# Patient Record
Sex: Male | Born: 1960 | Race: Black or African American | Hispanic: No | Smoking: Never smoker
Health system: Southern US, Community
[De-identification: ages and names within clinical notes are randomized; demographics above are authoritative.]

## PROBLEM LIST (undated history)

## (undated) DIAGNOSIS — I639 Cerebral infarction, unspecified: Secondary | ICD-10-CM

## (undated) DIAGNOSIS — I10 Essential (primary) hypertension: Secondary | ICD-10-CM

---

## 2001-11-12 ENCOUNTER — Emergency Department (HOSPITAL_COMMUNITY): Admission: EM | Admit: 2001-11-12 | Discharge: 2001-11-12 | Payer: Self-pay | Admitting: Emergency Medicine

## 2003-08-29 ENCOUNTER — Emergency Department (HOSPITAL_COMMUNITY): Admission: EM | Admit: 2003-08-29 | Discharge: 2003-08-29 | Payer: Self-pay

## 2003-09-10 ENCOUNTER — Emergency Department (HOSPITAL_COMMUNITY): Admission: EM | Admit: 2003-09-10 | Discharge: 2003-09-10 | Payer: Self-pay | Admitting: Emergency Medicine

## 2003-10-11 ENCOUNTER — Emergency Department (HOSPITAL_COMMUNITY): Admission: EM | Admit: 2003-10-11 | Discharge: 2003-10-12 | Payer: Self-pay | Admitting: Emergency Medicine

## 2003-10-12 ENCOUNTER — Emergency Department (HOSPITAL_COMMUNITY): Admission: EM | Admit: 2003-10-12 | Discharge: 2003-10-12 | Payer: Self-pay | Admitting: Emergency Medicine

## 2003-11-23 ENCOUNTER — Emergency Department (HOSPITAL_COMMUNITY): Admission: EM | Admit: 2003-11-23 | Discharge: 2003-11-23 | Payer: Self-pay | Admitting: Emergency Medicine

## 2004-09-20 ENCOUNTER — Emergency Department (HOSPITAL_COMMUNITY): Admission: EM | Admit: 2004-09-20 | Discharge: 2004-09-20 | Payer: Self-pay | Admitting: Family Medicine

## 2004-11-02 ENCOUNTER — Ambulatory Visit (HOSPITAL_BASED_OUTPATIENT_CLINIC_OR_DEPARTMENT_OTHER): Admission: RE | Admit: 2004-11-02 | Discharge: 2004-11-02 | Payer: Self-pay | Admitting: Otolaryngology

## 2004-11-04 ENCOUNTER — Ambulatory Visit: Payer: Self-pay | Admitting: Internal Medicine

## 2004-11-23 ENCOUNTER — Emergency Department (HOSPITAL_COMMUNITY): Admission: EM | Admit: 2004-11-23 | Discharge: 2004-11-23 | Payer: Self-pay | Admitting: Emergency Medicine

## 2005-03-28 ENCOUNTER — Emergency Department (HOSPITAL_COMMUNITY): Admission: EM | Admit: 2005-03-28 | Discharge: 2005-03-28 | Payer: Self-pay | Admitting: *Deleted

## 2005-04-28 ENCOUNTER — Emergency Department (HOSPITAL_COMMUNITY): Admission: EM | Admit: 2005-04-28 | Discharge: 2005-04-28 | Payer: Self-pay | Admitting: Family Medicine

## 2005-07-07 ENCOUNTER — Emergency Department (HOSPITAL_COMMUNITY): Admission: EM | Admit: 2005-07-07 | Discharge: 2005-07-07 | Payer: Self-pay | Admitting: Emergency Medicine

## 2005-07-10 ENCOUNTER — Emergency Department (HOSPITAL_COMMUNITY): Admission: EM | Admit: 2005-07-10 | Discharge: 2005-07-11 | Payer: Self-pay | Admitting: Emergency Medicine

## 2005-08-15 ENCOUNTER — Emergency Department (HOSPITAL_COMMUNITY): Admission: EM | Admit: 2005-08-15 | Discharge: 2005-08-15 | Payer: Self-pay | Admitting: Family Medicine

## 2006-10-20 ENCOUNTER — Emergency Department (HOSPITAL_COMMUNITY): Admission: EM | Admit: 2006-10-20 | Discharge: 2006-10-20 | Payer: Self-pay | Admitting: Emergency Medicine

## 2006-10-28 ENCOUNTER — Encounter: Admission: RE | Admit: 2006-10-28 | Discharge: 2006-10-28 | Payer: Self-pay | Admitting: Chiropractic Medicine

## 2008-03-23 ENCOUNTER — Emergency Department (HOSPITAL_COMMUNITY): Admission: EM | Admit: 2008-03-23 | Discharge: 2008-03-23 | Payer: Self-pay | Admitting: Emergency Medicine

## 2009-02-22 ENCOUNTER — Emergency Department (HOSPITAL_COMMUNITY): Admission: EM | Admit: 2009-02-22 | Discharge: 2009-02-22 | Payer: Self-pay | Admitting: Emergency Medicine

## 2009-03-23 ENCOUNTER — Emergency Department (HOSPITAL_COMMUNITY): Admission: EM | Admit: 2009-03-23 | Discharge: 2009-03-23 | Payer: Self-pay | Admitting: Emergency Medicine

## 2009-10-30 ENCOUNTER — Emergency Department (HOSPITAL_COMMUNITY): Admission: EM | Admit: 2009-10-30 | Discharge: 2009-10-30 | Payer: Self-pay | Admitting: Emergency Medicine

## 2010-01-21 ENCOUNTER — Observation Stay (HOSPITAL_COMMUNITY): Admission: EM | Admit: 2010-01-21 | Discharge: 2010-01-22 | Payer: Self-pay | Admitting: Emergency Medicine

## 2010-05-13 ENCOUNTER — Emergency Department (HOSPITAL_COMMUNITY): Admission: EM | Admit: 2010-05-13 | Discharge: 2010-05-14 | Payer: Self-pay | Admitting: Emergency Medicine

## 2010-06-19 ENCOUNTER — Emergency Department (HOSPITAL_COMMUNITY)
Admission: EM | Admit: 2010-06-19 | Discharge: 2010-06-19 | Payer: Self-pay | Source: Home / Self Care | Admitting: Emergency Medicine

## 2010-08-24 ENCOUNTER — Emergency Department (HOSPITAL_COMMUNITY)
Admission: EM | Admit: 2010-08-24 | Discharge: 2010-08-24 | Disposition: A | Discharge status: Home / Self Care | Payer: Worker's Compensation | Attending: Emergency Medicine | Admitting: Emergency Medicine

## 2010-08-24 DIAGNOSIS — G8929 Other chronic pain: Secondary | ICD-10-CM | POA: Insufficient documentation

## 2010-08-24 DIAGNOSIS — M542 Cervicalgia: Secondary | ICD-10-CM | POA: Insufficient documentation

## 2010-08-24 DIAGNOSIS — M549 Dorsalgia, unspecified: Secondary | ICD-10-CM | POA: Insufficient documentation

## 2010-08-24 DIAGNOSIS — IMO0002 Reserved for concepts with insufficient information to code with codable children: Secondary | ICD-10-CM | POA: Insufficient documentation

## 2010-08-24 DIAGNOSIS — Z79899 Other long term (current) drug therapy: Secondary | ICD-10-CM | POA: Insufficient documentation

## 2010-10-02 LAB — POCT I-STAT, CHEM 8
BUN: 13 mg/dL (ref 6–23)
Calcium, Ion: 1.17 mmol/L (ref 1.12–1.32)
Chloride: 102 mEq/L (ref 96–112)
Creatinine, Ser: 0.8 mg/dL (ref 0.4–1.5)
Glucose, Bld: 359 mg/dL — ABNORMAL HIGH (ref 70–99)
HCT: 44 % (ref 39.0–52.0)
Hemoglobin: 15 g/dL (ref 13.0–17.0)
Potassium: 4 mEq/L (ref 3.5–5.1)
Sodium: 137 mEq/L (ref 135–145)
TCO2: 27 mmol/L (ref 0–100)

## 2010-10-02 LAB — GLUCOSE, CAPILLARY
Glucose-Capillary: 250 mg/dL — ABNORMAL HIGH (ref 70–99)
Glucose-Capillary: 341 mg/dL — ABNORMAL HIGH (ref 70–99)

## 2010-10-02 LAB — URINALYSIS, ROUTINE W REFLEX MICROSCOPIC
Hgb urine dipstick: NEGATIVE
Protein, ur: NEGATIVE mg/dL
Urobilinogen, UA: 0.2 mg/dL (ref 0.0–1.0)

## 2010-10-02 LAB — URINE MICROSCOPIC-ADD ON

## 2010-10-07 LAB — GLUCOSE, CAPILLARY
Glucose-Capillary: 509 mg/dL — ABNORMAL HIGH (ref 70–99)
Glucose-Capillary: 534 mg/dL — ABNORMAL HIGH (ref 70–99)

## 2010-10-07 LAB — POCT I-STAT, CHEM 8
HCT: 46 % (ref 39.0–52.0)
Hemoglobin: 15.6 g/dL (ref 13.0–17.0)
Potassium: 4.7 mEq/L (ref 3.5–5.1)
Sodium: 136 mEq/L (ref 135–145)

## 2010-10-07 LAB — URINALYSIS, ROUTINE W REFLEX MICROSCOPIC
Nitrite: NEGATIVE
Specific Gravity, Urine: 1.037 — ABNORMAL HIGH (ref 1.005–1.030)
Urobilinogen, UA: 0.2 mg/dL (ref 0.0–1.0)

## 2010-10-07 LAB — POCT I-STAT 3, VENOUS BLOOD GAS (G3P V)
Acid-Base Excess: 4 mmol/L — ABNORMAL HIGH (ref 0.0–2.0)
O2 Saturation: 88 %

## 2010-10-07 LAB — URINE MICROSCOPIC-ADD ON

## 2010-10-10 LAB — URINALYSIS, ROUTINE W REFLEX MICROSCOPIC
Bilirubin Urine: NEGATIVE
Hgb urine dipstick: NEGATIVE
Nitrite: NEGATIVE
Specific Gravity, Urine: 1.036 — ABNORMAL HIGH (ref 1.005–1.030)
pH: 5 (ref 5.0–8.0)

## 2010-10-10 LAB — DIFFERENTIAL
Eosinophils Relative: 2 % (ref 0–5)
Lymphocytes Relative: 40 % (ref 12–46)
Lymphs Abs: 2 10*3/uL (ref 0.7–4.0)
Monocytes Absolute: 0.4 10*3/uL (ref 0.1–1.0)
Monocytes Relative: 8 % (ref 3–12)
Neutro Abs: 2.4 10*3/uL (ref 1.7–7.7)

## 2010-10-10 LAB — COMPREHENSIVE METABOLIC PANEL
AST: 16 U/L (ref 0–37)
Albumin: 3.9 g/dL (ref 3.5–5.2)
Calcium: 8.9 mg/dL (ref 8.4–10.5)
Chloride: 100 mEq/L (ref 96–112)
Creatinine, Ser: 0.83 mg/dL (ref 0.4–1.5)
GFR calc Af Amer: >60 mL/min (ref >60)
Total Bilirubin: 0.8 mg/dL (ref 0.3–1.2)
Total Protein: 7.7 g/dL (ref 6.0–8.3)

## 2010-10-10 LAB — URINE MICROSCOPIC-ADD ON

## 2010-10-10 LAB — CBC
MCV: 87.6 fL (ref 78.0–100.0)
Platelets: 272 10*3/uL (ref 150–400)
RDW: 13.1 % (ref 11.5–15.5)
WBC: 5 10*3/uL (ref 4.0–10.5)

## 2010-10-10 LAB — GLUCOSE, CAPILLARY: Glucose-Capillary: 307 mg/dL — ABNORMAL HIGH (ref 70–99)

## 2010-10-27 LAB — POCT URINALYSIS DIP (DEVICE)
Bilirubin Urine: NEGATIVE
Ketones, ur: NEGATIVE mg/dL
Protein, ur: NEGATIVE mg/dL
Specific Gravity, Urine: 1.01 (ref 1.005–1.030)
pH: 7 (ref 5.0–8.0)

## 2010-10-27 LAB — POCT I-STAT, CHEM 8
BUN: 12 mg/dL (ref 6–23)
Calcium, Ion: 1.24 mmol/L (ref 1.12–1.32)
Chloride: 100 mEq/L (ref 96–112)
Creatinine, Ser: 0.9 mg/dL (ref 0.4–1.5)
TCO2: 28 mmol/L (ref 0–100)

## 2010-12-07 NOTE — Procedures (Signed)
NAME:  REID, REGAS NO.:  [id number]   MEDICAL RECORD NO.:  [id number]          PATIENT TYPE:  OUT   LOCATION:  SLEEP CENTER                 FACILITY:  [facility]   PHYSICIAN:  Clinton D. Maple Hudson, M.D. DATE OF BIRTH:  08/04/1960   DATE OF STUDY:                              NOCTURNAL POLYSOMNOGRAM   REFERRING PHYSICIAN:  Zola Button T. Lazarus Salines, M.D.   INDICATIONS FOR STUDY:  Hypersomnia with sleep apnea. Epworth sleepiness  score 14/24, BMI 34, weight 214 pounds.   SLEEP ARCHITECTURE:  Total sleep time 440 minutes with sleep efficiency of  97%. Stage I was 1%, stage II was 55%, stages III and IV were 22%, REM was  21% of total sleep time. Sleep latency was 2 minutes. REM latency 41  minutes. Awake after sleep onset 10 minutes. Arousal index 21.   RESPIRATORY DATA:  Split study protocol. Respiratory disturbance index (RDI,  AHI) 138.6 obstructive events per hour before CPAP indicating severe  obstructive sleep apnea/hypopnea. This included 278 obstructive apneas, 1  mixed apnea, and 8 hypopneas before CPAP. Most events and most initial sleep  were while supine.  REM RDI 51.  CPAP was titrated to 25 CWP, RDI of 10.9  per hour, although snoring was not completely eliminated even at this  pressure. A medium full face Ultra Mirage mask was used with heated  humidifier.   OXYGEN DATA:  Very loud snoring with oxygen desaturation to a nadir of 51%  before CPAP. Even with CPAP control saturation was still dipping as low as  85% on room air.   CARDIAC DATA:  Normal sinus rhythm with sinus tachycardia, range 82 to 102  beats per minute.   MOVEMENT/PARASOMNIA:  Occasional leg jerk.   IMPRESSION/RECOMMENDATIONS:  1.  Very severe obstructive sleep apnea/hypopnea syndrome, RDI before CPAP      138 per hour with oxygen desaturation to 51% and very loud snoring.  2.  CPAP titrated to 25 CWP, RDI 10.9 per hour with residual snoring, using      a medium full face Ultra Mirage mask  with heated humidifier.  3.  Residual mild oxygen desaturation even at final CPAP control.    CDY/MEDQ  D:  11/04/2004 17:26:17  T:  11/04/2004 22:14:39  Job:  [id number]

## 2011-04-24 LAB — GLUCOSE, CAPILLARY: Glucose-Capillary: 107 — ABNORMAL HIGH

## 2011-08-05 ENCOUNTER — Encounter (HOSPITAL_COMMUNITY): Payer: Self-pay | Admitting: *Deleted

## 2011-08-05 ENCOUNTER — Emergency Department (HOSPITAL_COMMUNITY)
Admission: EM | Admit: 2011-08-05 | Discharge: 2011-08-05 | Disposition: A | Discharge status: Home / Self Care | Payer: BC Managed Care – PPO | Attending: Emergency Medicine | Admitting: Emergency Medicine

## 2011-08-05 ENCOUNTER — Emergency Department (HOSPITAL_COMMUNITY): Payer: BC Managed Care – PPO

## 2011-08-05 DIAGNOSIS — R6889 Other general symptoms and signs: Secondary | ICD-10-CM

## 2011-08-05 DIAGNOSIS — J111 Influenza due to unidentified influenza virus with other respiratory manifestations: Secondary | ICD-10-CM | POA: Insufficient documentation

## 2011-08-05 DIAGNOSIS — E119 Type 2 diabetes mellitus without complications: Secondary | ICD-10-CM | POA: Insufficient documentation

## 2011-08-05 DIAGNOSIS — Z79899 Other long term (current) drug therapy: Secondary | ICD-10-CM | POA: Insufficient documentation

## 2011-08-05 MED ORDER — TRAMADOL-ACETAMINOPHEN 37.5-325 MG PO TABS: 1.0000 | ORAL_TABLET | Freq: Four times a day (QID) | ORAL | Status: AC | PRN

## 2011-08-05 MED ORDER — ONDANSETRON 4 MG PO TBDP: 4.0000 mg | ORAL_TABLET | Freq: Three times a day (TID) | ORAL | Status: AC | PRN

## 2011-08-05 MED ORDER — BENZONATATE 100 MG PO CAPS: 100.0000 mg | ORAL_CAPSULE | Freq: Three times a day (TID) | ORAL | Status: AC

## 2011-08-05 NOTE — ED Provider Notes (Signed)
History     CSN: [id number]  Arrival date & time 08/05/11  1206   First MD Initiated Contact with Patient 08/05/11 1237      Chief Complaint  Patient presents with  . Influenza    flu symptoms    (Consider location/radiation/quality/duration/timing/severity/associated sxs/prior treatment) HPI  Pt presents to the ED with complaints of flu-like symptoms of cough, congestion, sore throat, muscle aches, chills, fevers, ear pain The patient states that the symptoms started 10 days ago.  Pt has been around other sick contacts and did not get the flu shot this year. The patient denies headaches, neck pain, weakness, vision changes, severe abdominal pain, inability to eat or drink, difficulty breathing, SOB, wheezing, chest pain. The patient has tried cough medicine, NSAIDS, and rest but has only felt mild relief.    Past Medical History  Diagnosis Date  . Diabetes mellitus     History reviewed. No pertinent past surgical history.  No family history on file.  History  Substance Use Topics  . Smoking status: Former Games developer  . Smokeless tobacco: Not on file  . Alcohol Use: No      Review of Systems  All other systems reviewed and are negative.    Allergies  Review of patient's allergies indicates no known allergies.  Home Medications   Current Outpatient Rx  Name Route Sig Dispense Refill  . CINNAMON 500 MG PO CAPS Oral Take 500 mg by mouth daily.    . OMEGA-3 FATTY ACIDS 1000 MG PO CAPS Oral Take 2 g by mouth daily.    Marland Kitchen OVER THE COUNTER MEDICATION Oral Take 30 mLs by mouth 2 (two) times daily. Over the counter cold and flu liquid medication. For cold/flu symptoms.    Marland Kitchen BENZONATATE 100 MG PO CAPS Oral Take 1 capsule (100 mg total) by mouth every 8 (eight) hours. 21 capsule 0  . ONDANSETRON 4 MG PO TBDP Oral Take 1 tablet (4 mg total) by mouth every 8 (eight) hours as needed for nausea. 20 tablet 0  . TRAMADOL-ACETAMINOPHEN 37.5-325 MG PO TABS Oral Take 1 tablet by mouth  every 6 (six) hours as needed for pain. 30 tablet 0    BP 139/90  Pulse 112  Temp(Src) 98.7 F (37.1 C) (Oral)  Resp 26  Wt 195 lb (88.451 kg)  SpO2 96%  Physical Exam  Nursing note and vitals reviewed. Constitutional: He is oriented to person, place, and time. He appears well-developed and well-nourished.  HENT:  Head: Normocephalic and atraumatic.  Eyes: EOM are normal. Pupils are equal, round, and reactive to light.  Neck: Normal range of motion.  Cardiovascular: Normal rate and regular rhythm.   Pulmonary/Chest: Effort normal and breath sounds normal.  Abdominal: Soft.  Musculoskeletal: Normal range of motion.  Neurological: He is alert and oriented to person, place, and time.  Skin: Skin is warm and dry.    ED Course  Procedures (including critical care time)  Labs Reviewed - No data to display Dg Chest 2 View  08/05/2011  *RADIOLOGY REPORT*  Clinical Data: Cough, body aches, history diabetes  CHEST - 2 VIEW  Comparison: 02/22/2009  Findings: Upper-normal size of cardiac silhouette. Mediastinal contours and pulmonary vascularity normal. Lungs clear. No pleural effusion or pneumothorax. Bones unremarkable.  IMPRESSION: No acute abnormalities.  Original Report Authenticated By: Lollie Marrow, M.D.     1. Flu-like symptoms       MDM  Pt treated symptomatically and given work note. Pulse is 86 upon  my examination in room.        Dorthula Matas, PA 08/05/11 1425

## 2011-08-05 NOTE — ED Notes (Signed)
Pt states "been feeling bad since Thursday, started aching, been coughing"

## 2011-08-06 NOTE — ED Provider Notes (Signed)
Medical screening examination/treatment/procedure(s) were performed by non-physician practitioner and as supervising physician I was immediately available for consultation/collaboration.   Dione Booze, MD 08/06/11 [phone]

## 2012-07-23 ENCOUNTER — Encounter (HOSPITAL_COMMUNITY): Payer: Self-pay | Admitting: Emergency Medicine

## 2012-07-23 ENCOUNTER — Emergency Department (HOSPITAL_COMMUNITY)
Admission: EM | Admit: 2012-07-23 | Discharge: 2012-07-23 | Disposition: A | Discharge status: Home / Self Care | Payer: BC Managed Care – PPO | Attending: Emergency Medicine | Admitting: Emergency Medicine

## 2012-07-23 DIAGNOSIS — E119 Type 2 diabetes mellitus without complications: Secondary | ICD-10-CM | POA: Insufficient documentation

## 2012-07-23 DIAGNOSIS — S0990XA Unspecified injury of head, initial encounter: Secondary | ICD-10-CM | POA: Insufficient documentation

## 2012-07-23 DIAGNOSIS — S134XXA Sprain of ligaments of cervical spine, initial encounter: Secondary | ICD-10-CM

## 2012-07-23 DIAGNOSIS — Y92410 Unspecified street and highway as the place of occurrence of the external cause: Secondary | ICD-10-CM | POA: Insufficient documentation

## 2012-07-23 DIAGNOSIS — S139XXA Sprain of joints and ligaments of unspecified parts of neck, initial encounter: Secondary | ICD-10-CM | POA: Insufficient documentation

## 2012-07-23 DIAGNOSIS — Y9389 Activity, other specified: Secondary | ICD-10-CM | POA: Insufficient documentation

## 2012-07-23 DIAGNOSIS — IMO0002 Reserved for concepts with insufficient information to code with codable children: Secondary | ICD-10-CM | POA: Insufficient documentation

## 2012-07-23 DIAGNOSIS — Z87891 Personal history of nicotine dependence: Secondary | ICD-10-CM | POA: Insufficient documentation

## 2012-07-23 MED ORDER — IBUPROFEN 800 MG PO TABS
800.0000 mg | ORAL_TABLET | Freq: Once | ORAL | Status: AC
  Administered 2012-07-23: 800 mg via ORAL
  Filled 2012-07-23: qty 1

## 2012-07-23 MED ORDER — IBUPROFEN 800 MG PO TABS: 800.0000 mg | ORAL_TABLET | Freq: Three times a day (TID) | ORAL | Status: DC

## 2012-07-23 MED ORDER — OXYCODONE-ACETAMINOPHEN 5-325 MG PO TABS: 1.0000 | ORAL_TABLET | Freq: Four times a day (QID) | ORAL | Status: DC | PRN

## 2012-07-23 MED ORDER — OXYCODONE-ACETAMINOPHEN 5-325 MG PO TABS
1.0000 | ORAL_TABLET | Freq: Once | ORAL | Status: AC
  Administered 2012-07-23: 1 via ORAL
  Filled 2012-07-23: qty 1

## 2012-07-23 MED ORDER — CYCLOBENZAPRINE HCL 10 MG PO TABS
5.0000 mg | ORAL_TABLET | Freq: Once | ORAL | Status: AC
  Administered 2012-07-23: 5 mg via ORAL
  Filled 2012-07-23: qty 1

## 2012-07-23 MED ORDER — CYCLOBENZAPRINE HCL 10 MG PO TABS: 10.0000 mg | ORAL_TABLET | Freq: Two times a day (BID) | ORAL | Status: DC | PRN
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
## 2012-07-25 NOTE — ED Provider Notes (Signed)
Medical screening examination/treatment/procedure(s) were performed by non-physician practitioner and as supervising physician I was immediately available for consultation/collaboration.   Reily Ilic, MD 07/25/12 0707 

## 2013-05-06 ENCOUNTER — Encounter (HOSPITAL_COMMUNITY): Payer: Self-pay | Admitting: Emergency Medicine

## 2013-05-06 ENCOUNTER — Emergency Department (HOSPITAL_COMMUNITY): Payer: Worker's Compensation

## 2013-05-06 ENCOUNTER — Emergency Department (HOSPITAL_COMMUNITY)
Admission: EM | Admit: 2013-05-06 | Discharge: 2013-05-06 | Disposition: A | Discharge status: Home / Self Care | Payer: Worker's Compensation | Attending: Emergency Medicine | Admitting: Emergency Medicine

## 2013-05-06 DIAGNOSIS — S4980XA Other specified injuries of shoulder and upper arm, unspecified arm, initial encounter: Secondary | ICD-10-CM | POA: Insufficient documentation

## 2013-05-06 DIAGNOSIS — S46909A Unspecified injury of unspecified muscle, fascia and tendon at shoulder and upper arm level, unspecified arm, initial encounter: Secondary | ICD-10-CM | POA: Insufficient documentation

## 2013-05-06 DIAGNOSIS — W11XXXA Fall on and from ladder, initial encounter: Secondary | ICD-10-CM | POA: Insufficient documentation

## 2013-05-06 DIAGNOSIS — T148XXA Other injury of unspecified body region, initial encounter: Secondary | ICD-10-CM

## 2013-05-06 DIAGNOSIS — Y9289 Other specified places as the place of occurrence of the external cause: Secondary | ICD-10-CM | POA: Insufficient documentation

## 2013-05-06 DIAGNOSIS — T1500XA Foreign body in cornea, unspecified eye, initial encounter: Secondary | ICD-10-CM | POA: Insufficient documentation

## 2013-05-06 DIAGNOSIS — Y990 Civilian activity done for income or pay: Secondary | ICD-10-CM | POA: Insufficient documentation

## 2013-05-06 DIAGNOSIS — Z79899 Other long term (current) drug therapy: Secondary | ICD-10-CM | POA: Insufficient documentation

## 2013-05-06 DIAGNOSIS — T1590XA Foreign body on external eye, part unspecified, unspecified eye, initial encounter: Secondary | ICD-10-CM | POA: Insufficient documentation

## 2013-05-06 DIAGNOSIS — Z87891 Personal history of nicotine dependence: Secondary | ICD-10-CM | POA: Insufficient documentation

## 2013-05-06 DIAGNOSIS — E119 Type 2 diabetes mellitus without complications: Secondary | ICD-10-CM | POA: Insufficient documentation

## 2013-05-06 DIAGNOSIS — W19XXXA Unspecified fall, initial encounter: Secondary | ICD-10-CM

## 2013-05-06 DIAGNOSIS — IMO0002 Reserved for concepts with insufficient information to code with codable children: Secondary | ICD-10-CM | POA: Insufficient documentation

## 2013-05-06 DIAGNOSIS — S0993XA Unspecified injury of face, initial encounter: Secondary | ICD-10-CM | POA: Insufficient documentation

## 2013-05-06 DIAGNOSIS — W010XXA Fall on same level from slipping, tripping and stumbling without subsequent striking against object, initial encounter: Secondary | ICD-10-CM | POA: Insufficient documentation

## 2013-05-06 LAB — CBC WITH DIFFERENTIAL/PLATELET
Basophils Relative: 1 % (ref 0–1)
Eosinophils Absolute: 0.1 10*3/uL (ref 0.0–0.7)
Hemoglobin: 13.9 g/dL (ref 13.0–17.0)
MCH: 29.4 pg (ref 26.0–34.0)
MCHC: 35.5 g/dL (ref 30.0–36.0)
Monocytes Relative: 8 % (ref 3–12)
Neutrophils Relative %: 53 % (ref 43–77)
Platelets: 299 10*3/uL (ref 150–400)

## 2013-05-06 LAB — URINALYSIS, ROUTINE W REFLEX MICROSCOPIC
Glucose, UA: 250 mg/dL — AB
Leukocytes, UA: NEGATIVE
Specific Gravity, Urine: 1.015 (ref 1.005–1.030)
pH: 5 (ref 5.0–8.0)

## 2013-05-06 LAB — COMPREHENSIVE METABOLIC PANEL
Albumin: 4 g/dL (ref 3.5–5.2)
Alkaline Phosphatase: 81 U/L (ref 39–117)
BUN: 10 mg/dL (ref 6–23)
Calcium: 9.1 mg/dL (ref 8.4–10.5)
Potassium: 3.7 mEq/L (ref 3.5–5.1)
Total Protein: 7.9 g/dL (ref 6.0–8.3)

## 2013-05-06 LAB — POCT I-STAT TROPONIN I: Troponin i, poc: 0 ng/mL (ref 0.00–0.08)

## 2013-05-06 MED ORDER — IOHEXOL 300 MG/ML  SOLN
100.0000 mL | Freq: Once | INTRAMUSCULAR | Status: AC | PRN
  Administered 2013-05-06: 100 mL via INTRAVENOUS

## 2013-05-06 MED ORDER — OXYCODONE-ACETAMINOPHEN 5-325 MG PO TABS: 2.0000 | ORAL_TABLET | Freq: Four times a day (QID) | ORAL | Status: DC | PRN

## 2013-05-06 MED ORDER — DIAZEPAM 5 MG PO TABS: 5.0000 mg | ORAL_TABLET | Freq: Four times a day (QID) | ORAL | Status: DC | PRN

## 2013-05-06 MED ORDER — ONDANSETRON HCL 4 MG/2ML IJ SOLN
4.0000 mg | Freq: Once | INTRAMUSCULAR | Status: AC
  Administered 2013-05-06: 4 mg via INTRAVENOUS
  Filled 2013-05-06: qty 2

## 2013-05-06 MED ORDER — MORPHINE SULFATE 4 MG/ML IJ SOLN
6.0000 mg | Freq: Once | INTRAMUSCULAR | Status: AC
  Administered 2013-05-06: 6 mg via INTRAVENOUS
  Filled 2013-05-06: qty 2

## 2013-05-06 MED ORDER — DIAZEPAM 5 MG/ML IJ SOLN
5.0000 mg | Freq: Once | INTRAMUSCULAR | Status: AC
  Administered 2013-05-06: 5 mg via INTRAVENOUS
  Filled 2013-05-06: qty 2

## 2013-05-06 MED ORDER — FLUORESCEIN SODIUM 1 MG OP STRP
1.0000 | ORAL_STRIP | Freq: Once | OPHTHALMIC | Status: AC
  Administered 2013-05-06: 1 via OPHTHALMIC
  Filled 2013-05-06: qty 1

## 2013-05-06 MED ORDER — HYDROMORPHONE HCL PF 1 MG/ML IJ SOLN
1.0000 mg | Freq: Once | INTRAMUSCULAR | Status: AC
  Administered 2013-05-06: 1 mg via INTRAVENOUS
  Filled 2013-05-06: qty 1

## 2013-05-06 MED ORDER — MORPHINE SULFATE 2 MG/ML IJ SOLN
INTRAMUSCULAR | Status: AC
  Filled 2013-05-06: qty 1

## 2013-05-06 MED ORDER — TETRACAINE HCL 0.5 % OP SOLN
1.0000 [drp] | Freq: Once | OPHTHALMIC | Status: AC
  Administered 2013-05-06: 1 [drp] via OPHTHALMIC
  Filled 2013-05-06: qty 2

## 2013-05-06 MED ORDER — SODIUM CHLORIDE 0.9 % IV BOLUS (SEPSIS)
1000.0000 mL | Freq: Once | INTRAVENOUS | Status: AC
  Administered 2013-05-06: 1000 mL via INTRAVENOUS

## 2013-05-06 MED ORDER — IBUPROFEN 800 MG PO TABS: 800.0000 mg | ORAL_TABLET | Freq: Three times a day (TID) | ORAL | Status: DC

## 2013-05-06 NOTE — ED Notes (Signed)
Pt placed on monitor, continuous pulse oximetry and blood pressure cuff; EKG being performed per Silverio Lay, MD

## 2013-05-06 NOTE — ED Notes (Signed)
Pt placed on 3L [place], 100%.
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
## 2013-05-06 NOTE — ED Notes (Signed)
MD at bedside completing fast exam.

## 2013-05-06 NOTE — ED Notes (Signed)
Pt's O2 dropped, placed on NRB. PT AO x4. 100% NRB.

## 2013-05-06 NOTE — ED Provider Notes (Signed)
CSN: [id number]     Arrival date & time 05/06/13  1424 History   First MD Initiated Contact with Patient 05/06/13 1450     Chief Complaint  Patient presents with  . Fall   (Consider location/radiation/quality/duration/timing/severity/associated sxs/prior Treatment) The history is provided by the patient.  Adam Callahan is a 52 y.o. male history diabetes here presenting with fall. Was on top of a 10 foot ladder and the tile fell and he slipped and fell backwards. He had loss of consciousness and complains of back pain and neck pain and right shoulder pain. Denies any abdominal pain or vomiting.    Past Medical History  Diagnosis Date  . Diabetes mellitus    History reviewed. No pertinent past surgical history. No family history on file. History  Substance Use Topics  . Smoking status: Former Games developer  . Smokeless tobacco: Not on file  . Alcohol Use: No    Review of Systems  Musculoskeletal: Positive for back pain and neck pain.       R shoulder pain   All other systems reviewed and are negative.    Allergies  Review of patient's allergies indicates no known allergies.  Home Medications   Current Outpatient Rx  Name  Route  Sig  Dispense  Refill  . Cinnamon 500 MG capsule   Oral   Take 500 mg by mouth daily.         . fish oil-omega-3 fatty acids 1000 MG capsule   Oral   Take 2 g by mouth daily.         Marland Kitchen ibuprofen (ADVIL,MOTRIN) 200 MG tablet   Oral   Take 400 mg by mouth every 6 (six) hours as needed for pain.         . metFORMIN (GLUCOPHAGE) 500 MG tablet   Oral   Take 500 mg by mouth 2 (two) times daily with a meal.          BP 141/72  Pulse 76  Temp(Src) 98.3 F (36.8 C) (Oral)  Resp 18  Wt 174 lb 14.4 oz (79.334 kg)  SpO2 100% Physical Exam  Nursing note and vitals reviewed. Constitutional: He is oriented to person, place, and time.  Uncomfortable. C collar in place   HENT:  Head: Normocephalic.  Mouth/Throat: Oropharynx is clear and  moist.  Eyes: Conjunctivae are normal. Pupils are equal, round, and reactive to light.  Glass in R eye.   Neck:  c collar in place. ? Midline tenderness   Cardiovascular: Normal rate, regular rhythm and normal heart sounds.   Pulmonary/Chest: Effort normal and breath sounds normal. No respiratory distress. He has no wheezes. He has no rales.  Abdominal: Soft. Bowel sounds are normal. He exhibits no distension. There is no tenderness. There is no rebound and no guarding.  Musculoskeletal:  + lower lumbar midline vs paralumbar tenderness. Dec ROM bilateral hips. R shoulder dec ROM.   Neurological: He is alert and oriented to person, place, and time.  Skin: Skin is warm and dry.  Psychiatric: He has a normal mood and affect. His behavior is normal. Judgment and thought content normal.    ED Course  FOREIGN BODY REMOVAL Date/Time: 05/06/2013 5:29 PM Performed by: Richardean Canal Authorized by: Richardean Canal Consent: Verbal consent obtained. Risks and benefits: risks, benefits and alternatives were discussed Consent given by: patient Patient understanding: patient states understanding of the procedure being performed Patient consent: the patient's understanding of the procedure matches consent given Procedure  consent: procedure consent matches procedure scheduled Patient identity confirmed: verbally with patient and arm band Body area: eye Location details: right cornea Patient sedated: no Patient restrained: no Localization method: visualized Removal mechanism: irrigation Eye examined with fluorescein. No fluorescein uptake. No residual rust ring present. Depth: superficial Complexity: simple 1 objects recovered. Patient tolerance: Patient tolerated the procedure well with no immediate complications. Comments: Use foresein afterwards, no corneal abrasion bilaterally    (including critical care time) Labs Review Labs Reviewed  COMPREHENSIVE METABOLIC PANEL - Abnormal; Notable for  the following:    Sodium 133 (*)    Glucose, Bld 195 (*)    All other components within normal limits  URINALYSIS, ROUTINE W REFLEX MICROSCOPIC - Abnormal; Notable for the following:    Glucose, UA 250 (*)    All other components within normal limits  CBC WITH DIFFERENTIAL  POCT I-STAT TROPONIN I   EMERGENCY DEPARTMENT [place] FAST EXAM  INDICATIONS:Blunt injury of abdomen  PERFORMED BY: Myself  IMAGES ARCHIVED?: Yes  FINDINGS: All views negative  LIMITATIONS:  Emergent procedure  INTERPRETATION:  No abdominal free fluid  COMMENT:  No free fluid     Imaging Review Dg Chest 1 View  05/06/2013   CLINICAL DATA:  Trauma. Fall  EXAM: CHEST - 1 VIEW  COMPARISON:  08/05/2011  FINDINGS: Heart size is upper limits of normal in size. There is slight asymmetric elevation of the right hemidiaphragm. No pleural effusion or edema identified. No airspace consolidation.  IMPRESSION: No active disease.   Electronically Signed   By: Signa Kell M.D.   On: 05/06/2013 16:56   Dg Lumbar Spine Complete  05/06/2013   CLINICAL DATA:  Fall with low back pain.  EXAM: LUMBAR SPINE - COMPLETE 4+ VIEW  COMPARISON:  None  FINDINGS: 5 non rib-bearing lumbar type vertebra are identified in normal alignment.  There is no evidence of fracture or subluxation.  No focal bony lesions or spondylolysis noted.  Minimal multilevel degenerative disc disease noted.  IMPRESSION: No static evidence of acute injury to the lumbar spine.   Electronically Signed   By: Laveda Abbe M.D.   On: 05/06/2013 17:00   Dg Pelvis 1-2 Views  05/06/2013   CLINICAL DATA:  52 year old male with pelvic pain following fall  EXAM: PELVIS - 1-2 VIEW  COMPARISON:  None.  FINDINGS: There is no evidence of fracture, subluxation or dislocation.  Mild degenerative changes within the hips noted.  No focal bony lesions are present.  IMPRESSION: No acute bony abnormalities.   Electronically Signed   By: Laveda Abbe M.D.   On: 05/06/2013 16:51   Dg Shoulder  Right  05/06/2013   CLINICAL DATA:  Status post fall. Left shoulder pain.  EXAM: RIGHT SHOULDER - 2+ VIEW  COMPARISON:  None.  FINDINGS: There is no fracture or dislocation. Acromioclavicular degenerative change is noted. Image right lung and ribs are unremarkable.  IMPRESSION: No acute finding.   Electronically Signed   By: Drusilla Kanner M.D.   On: 05/06/2013 16:51   Ct Head Wo Contrast  05/06/2013   CLINICAL DATA:  Fall from ladder.  Blunt trauma to head and back.  EXAM: CT HEAD WITHOUT CONTRAST  CT CERVICAL SPINE WITHOUT CONTRAST  TECHNIQUE: Multidetector CT imaging of the head and cervical spine was performed following the standard protocol without intravenous contrast. Multiplanar CT image reconstructions of the cervical spine were also generated.  COMPARISON:  Head and neck CT 06/19/2010  FINDINGS: CT HEAD FINDINGS  No  intracranial hemorrhage. No parenchymal contusion. No midline shift or mass effect. Basilar cisterns are patent. No skull base fracture. No fluid in the paranasal sinuses or mastoid air cells.  CT CERVICAL SPINE FINDINGS  No prevertebral soft tissue swelling. Normal alignment of cervical vertebral bodies. No loss of vertebral body height. Normal facet articulation. Normal craniocervical junction. No evidence epidural or paraspinal hematoma.  There is thickening along the anterior longitudinal ligament posterior to the C4 vertebral body to 3-4 mm. There is a focal bulge at this level seen on the axial images 45, series 7. This is not changed from CT of 06/19/2010.  IMPRESSION: 1. No evidence of intracranial trauma.  2.  No cervical spine fracture  3. Disc bulge posterior to the C4 vertebral body is stable compared to prior.   Electronically Signed   By: Genevive Bi M.D.   On: 05/06/2013 16:29   Ct Chest W Contrast  05/06/2013   CLINICAL DATA:  Larey Seat from ladder. Back pain.  EXAM: CT CHEST, ABDOMEN, AND PELVIS WITH CONTRAST  TECHNIQUE: Multidetector CT imaging of the chest,  abdomen and pelvis was performed following the standard protocol during bolus administration of intravenous contrast.  CONTRAST:  OMNIPAQUE IOHEXOL 300 MG/ML  SOLN  COMPARISON:  None.  FINDINGS: CT CHEST FINDINGS  Mediastinum: Unremarkable appearing central pulmonary vessels. Heart size is normal. No pericardial fluid, thickening or calcification. No acute abnormality of the thoracic aorta or other great vessels of the mediastinum. No pathologically enlarged mediastinal or hilar lymph nodes. The esophagus is normal in appearance.  Lungs/Pleura: No consolidative airspace disease. No pleural effusions. No suspicious appearing pulmonary nodules or masses. No pneumothorax.  Musculoskeletal: No aggressive appearing lytic or blastic lesions are noted in the visualized portions of the skeleton. No rib fracture is evident. Extra thoracic soft tissues grossly unremarkable. Incidental central disc protrusion of a chronic nature partially calcified T7-T8.  CT ABDOMEN AND PELVIS FINDINGS  BODY WALL: Unremarkable.  ABDOMEN/PELVIS:  Liver: No focal abnormality. Subcentimeter hypodensity at the hepatic dome is consistent with a cyst.  Biliary: No evidence of biliary obstruction or stone.  Pancreas: Unremarkable.  Spleen: Unremarkable.  Adrenals: Unremarkable.  Kidneys and ureters: No hydronephrosis or stone.  Bladder: Unremarkable.  Bowel: No obstruction. Normal appendix.  Retroperitoneum: No mass or adenopathy.  Peritoneum: No free fluid or gas.  Reproductive: Unremarkable.  Vascular: No acute abnormality.  OSSEOUS: No acute abnormalities. No suspicious lytic or blastic lesions.  IMPRESSION: No acute chest, abdominal, or pelvic abnormalities status post fall from a ladder. No worrisome for posttraumatic osseous lesions.   Electronically Signed   By: Davonna Belling M.D.   On: 05/06/2013 19:04   Ct Cervical Spine Wo Contrast  05/06/2013   CLINICAL DATA:  Fall from ladder.  Blunt trauma to head and back.  EXAM: CT HEAD  WITHOUT CONTRAST  CT CERVICAL SPINE WITHOUT CONTRAST  TECHNIQUE: Multidetector CT imaging of the head and cervical spine was performed following the standard protocol without intravenous contrast. Multiplanar CT image reconstructions of the cervical spine were also generated.  COMPARISON:  Head and neck CT 06/19/2010  FINDINGS: CT HEAD FINDINGS  No intracranial hemorrhage. No parenchymal contusion. No midline shift or mass effect. Basilar cisterns are patent. No skull base fracture. No fluid in the paranasal sinuses or mastoid air cells.  CT CERVICAL SPINE FINDINGS  No prevertebral soft tissue swelling. Normal alignment of cervical vertebral bodies. No loss of vertebral body height. Normal facet articulation. Normal craniocervical junction. No evidence  epidural or paraspinal hematoma.  There is thickening along the anterior longitudinal ligament posterior to the C4 vertebral body to 3-4 mm. There is a focal bulge at this level seen on the axial images 45, series 7. This is not changed from CT of 06/19/2010.  IMPRESSION: 1. No evidence of intracranial trauma.  2.  No cervical spine fracture  3. Disc bulge posterior to the C4 vertebral body is stable compared to prior.   Electronically Signed   By: Genevive Bi M.D.   On: 05/06/2013 16:29   Ct Abdomen Pelvis W Contrast  05/06/2013   CLINICAL DATA:  Larey Seat from ladder. Back pain.  EXAM: CT CHEST, ABDOMEN, AND PELVIS WITH CONTRAST  TECHNIQUE: Multidetector CT imaging of the chest, abdomen and pelvis was performed following the standard protocol during bolus administration of intravenous contrast.  CONTRAST:  OMNIPAQUE IOHEXOL 300 MG/ML  SOLN  COMPARISON:  None.  FINDINGS: CT CHEST FINDINGS  Mediastinum: Unremarkable appearing central pulmonary vessels. Heart size is normal. No pericardial fluid, thickening or calcification. No acute abnormality of the thoracic aorta or other great vessels of the mediastinum. No pathologically enlarged mediastinal or hilar  lymph nodes. The esophagus is normal in appearance.  Lungs/Pleura: No consolidative airspace disease. No pleural effusions. No suspicious appearing pulmonary nodules or masses. No pneumothorax.  Musculoskeletal: No aggressive appearing lytic or blastic lesions are noted in the visualized portions of the skeleton. No rib fracture is evident. Extra thoracic soft tissues grossly unremarkable. Incidental central disc protrusion of a chronic nature partially calcified T7-T8.  CT ABDOMEN AND PELVIS FINDINGS  BODY WALL: Unremarkable.  ABDOMEN/PELVIS:  Liver: No focal abnormality. Subcentimeter hypodensity at the hepatic dome is consistent with a cyst.  Biliary: No evidence of biliary obstruction or stone.  Pancreas: Unremarkable.  Spleen: Unremarkable.  Adrenals: Unremarkable.  Kidneys and ureters: No hydronephrosis or stone.  Bladder: Unremarkable.  Bowel: No obstruction. Normal appendix.  Retroperitoneum: No mass or adenopathy.  Peritoneum: No free fluid or gas.  Reproductive: Unremarkable.  Vascular: No acute abnormality.  OSSEOUS: No acute abnormalities. No suspicious lytic or blastic lesions.  IMPRESSION: No acute chest, abdominal, or pelvic abnormalities status post fall from a ladder. No worrisome for posttraumatic osseous lesions.   Electronically Signed   By: Davonna Belling M.D.   On: 05/06/2013 19:04    EKG Interpretation   None       MDM  No diagnosis found. Adam Callahan is a 52 y.o. male here with s/p fall. Will do CT head/neck. Will get xrays. Will do FAST exam to r/o intraabdominal injury (but he fell backwards without hitting his abdomen so likely just referred pain from back).   5:30 PM CT head/neck nl. FAST negative but patient still in severe pain. Glass in eye removed with irrigation no corneal abrasion. Will do ct chest/ab/pel to r/o internal injury.   7:20 PM CT showed no internal injury. Felt better. Will d/c home on motrin, percocet, valium.   Richardean Canal, MD 05/06/13 Jerene Bears

## 2013-05-06 NOTE — ED Notes (Signed)
Pt undressed and in a gown at this time; Silverio Lay, MD at bedside

## 2013-05-06 NOTE — ED Notes (Signed)
Pt was at work when he fell off of a ladder, pt reports that he fell about 10 feet and struck his back.  Pt reports LOC and does not take any blood thinners.  Pt states that he pain in lower back, mid back, head and right shoulder.  Pt is alert and oriented.

## 2013-06-06 ENCOUNTER — Encounter (HOSPITAL_COMMUNITY): Payer: Self-pay | Admitting: Emergency Medicine

## 2013-06-06 ENCOUNTER — Inpatient Hospital Stay (HOSPITAL_COMMUNITY)
Admission: EM | Admit: 2013-06-06 | Discharge: 2013-06-09 | DRG: 064 | Disposition: A | Discharge status: Rehabilitation Facility | Payer: BC Managed Care – PPO | Attending: Internal Medicine | Admitting: Internal Medicine

## 2013-06-06 ENCOUNTER — Emergency Department (HOSPITAL_COMMUNITY): Payer: BC Managed Care – PPO

## 2013-06-06 DIAGNOSIS — Z794 Long term (current) use of insulin: Secondary | ICD-10-CM

## 2013-06-06 DIAGNOSIS — E860 Dehydration: Secondary | ICD-10-CM

## 2013-06-06 DIAGNOSIS — G4730 Sleep apnea, unspecified: Secondary | ICD-10-CM | POA: Diagnosis present

## 2013-06-06 DIAGNOSIS — Z8249 Family history of ischemic heart disease and other diseases of the circulatory system: Secondary | ICD-10-CM

## 2013-06-06 DIAGNOSIS — E872 Acidosis, unspecified: Secondary | ICD-10-CM | POA: Diagnosis present

## 2013-06-06 DIAGNOSIS — Z23 Encounter for immunization: Secondary | ICD-10-CM

## 2013-06-06 DIAGNOSIS — IMO0002 Reserved for concepts with insufficient information to code with codable children: Secondary | ICD-10-CM

## 2013-06-06 DIAGNOSIS — E1149 Type 2 diabetes mellitus with other diabetic neurological complication: Secondary | ICD-10-CM | POA: Diagnosis present

## 2013-06-06 DIAGNOSIS — Z833 Family history of diabetes mellitus: Secondary | ICD-10-CM

## 2013-06-06 DIAGNOSIS — Z7902 Long term (current) use of antithrombotics/antiplatelets: Secondary | ICD-10-CM

## 2013-06-06 DIAGNOSIS — I6340 Cerebral infarction due to embolism of unspecified cerebral artery: Principal | ICD-10-CM | POA: Diagnosis present

## 2013-06-06 DIAGNOSIS — T730XXA Starvation, initial encounter: Secondary | ICD-10-CM | POA: Diagnosis present

## 2013-06-06 DIAGNOSIS — E876 Hypokalemia: Secondary | ICD-10-CM

## 2013-06-06 DIAGNOSIS — Z79899 Other long term (current) drug therapy: Secondary | ICD-10-CM

## 2013-06-06 DIAGNOSIS — E119 Type 2 diabetes mellitus without complications: Secondary | ICD-10-CM

## 2013-06-06 DIAGNOSIS — I7774 Dissection of vertebral artery: Secondary | ICD-10-CM | POA: Diagnosis present

## 2013-06-06 DIAGNOSIS — Z7982 Long term (current) use of aspirin: Secondary | ICD-10-CM

## 2013-06-06 DIAGNOSIS — R29810 Facial weakness: Secondary | ICD-10-CM | POA: Diagnosis present

## 2013-06-06 DIAGNOSIS — M6282 Rhabdomyolysis: Secondary | ICD-10-CM

## 2013-06-06 DIAGNOSIS — G8190 Hemiplegia, unspecified affecting unspecified side: Secondary | ICD-10-CM | POA: Diagnosis present

## 2013-06-06 DIAGNOSIS — I639 Cerebral infarction, unspecified: Secondary | ICD-10-CM

## 2013-06-06 DIAGNOSIS — Z87891 Personal history of nicotine dependence: Secondary | ICD-10-CM

## 2013-06-06 DIAGNOSIS — X58XXXA Exposure to other specified factors, initial encounter: Secondary | ICD-10-CM | POA: Diagnosis present

## 2013-06-06 DIAGNOSIS — E1142 Type 2 diabetes mellitus with diabetic polyneuropathy: Secondary | ICD-10-CM | POA: Diagnosis present

## 2013-06-06 DIAGNOSIS — Z8049 Family history of malignant neoplasm of other genital organs: Secondary | ICD-10-CM

## 2013-06-06 DIAGNOSIS — H53469 Homonymous bilateral field defects, unspecified side: Secondary | ICD-10-CM | POA: Diagnosis present

## 2013-06-06 DIAGNOSIS — R471 Dysarthria and anarthria: Secondary | ICD-10-CM | POA: Diagnosis present

## 2013-06-06 DIAGNOSIS — E785 Hyperlipidemia, unspecified: Secondary | ICD-10-CM

## 2013-06-06 DIAGNOSIS — W11XXXA Fall on and from ladder, initial encounter: Secondary | ICD-10-CM | POA: Diagnosis present

## 2013-06-06 DIAGNOSIS — E1165 Type 2 diabetes mellitus with hyperglycemia: Secondary | ICD-10-CM | POA: Diagnosis present

## 2013-06-06 DIAGNOSIS — R4701 Aphasia: Secondary | ICD-10-CM | POA: Diagnosis present

## 2013-06-06 LAB — POCT I-STAT TROPONIN I: Troponin i, poc: 0 ng/mL (ref 0.00–0.08)

## 2013-06-06 LAB — CBC
Hemoglobin: 15.9 g/dL (ref 13.0–17.0)
MCH: 30.6 pg (ref 26.0–34.0)
MCV: 84.2 fL (ref 78.0–100.0)
Platelets: 301 10*3/uL (ref 150–400)
RBC: 5.2 MIL/uL (ref 4.22–5.81)
RDW: 12.4 % (ref 11.5–15.5)
WBC: 11.5 10*3/uL — ABNORMAL HIGH (ref 4.0–10.5)

## 2013-06-06 LAB — COMPREHENSIVE METABOLIC PANEL
ALT: 22 U/L (ref 0–53)
AST: 26 U/L (ref 0–37)
Alkaline Phosphatase: 71 U/L (ref 39–117)
CO2: 27 mEq/L (ref 19–32)
Calcium: 9.2 mg/dL (ref 8.4–10.5)
Chloride: 97 mEq/L (ref 96–112)
GFR calc non Af Amer: >90 mL/min (ref >90)
Potassium: 3.6 mEq/L (ref 3.5–5.1)
Sodium: 137 mEq/L (ref 135–145)
Total Bilirubin: 0.8 mg/dL (ref 0.3–1.2)

## 2013-06-06 LAB — URINALYSIS, ROUTINE W REFLEX MICROSCOPIC
Ketones, ur: >80 mg/dL — AB
Nitrite: NEGATIVE
Specific Gravity, Urine: 1.037 — ABNORMAL HIGH (ref 1.005–1.030)
Urobilinogen, UA: 1 mg/dL (ref 0.0–1.0)

## 2013-06-06 LAB — ETHANOL: Alcohol, Ethyl (B): <11 mg/dL (ref 0–11)

## 2013-06-06 LAB — DIFFERENTIAL
Basophils Absolute: 0 10*3/uL (ref 0.0–0.1)
Lymphocytes Relative: 17 % (ref 12–46)
Lymphs Abs: 2 10*3/uL (ref 0.7–4.0)
Neutro Abs: 8.3 10*3/uL — ABNORMAL HIGH (ref 1.7–7.7)

## 2013-06-06 LAB — RAPID URINE DRUG SCREEN, HOSP PERFORMED
Barbiturates: NOT DETECTED
Benzodiazepines: NOT DETECTED
Cocaine: NOT DETECTED
Tetrahydrocannabinol: NOT DETECTED

## 2013-06-06 LAB — PROTIME-INR: Prothrombin Time: 13.4 seconds (ref 11.6–15.2)

## 2013-06-06 LAB — URINE MICROSCOPIC-ADD ON

## 2013-06-06 LAB — CG4 I-STAT (LACTIC ACID): Lactic Acid, Venous: 2.05 mmol/L (ref 0.5–2.2)

## 2013-06-06 LAB — POCT I-STAT, CHEM 8
BUN: 21 mg/dL (ref 6–23)
Calcium, Ion: 1.14 mmol/L (ref 1.12–1.23)
Hemoglobin: 16 g/dL (ref 13.0–17.0)
Potassium: 3.3 mEq/L — ABNORMAL LOW (ref 3.5–5.1)
TCO2: 26 mmol/L (ref 0–100)

## 2013-06-06 LAB — APTT: aPTT: 31 seconds (ref 24–37)

## 2013-06-06 LAB — CK: Total CK: 1086 U/L — ABNORMAL HIGH (ref 7–232)

## 2013-06-06 MED ORDER — ENOXAPARIN SODIUM 40 MG/0.4ML ~~LOC~~ SOLN
40.0000 mg | Freq: Every day | SUBCUTANEOUS | Status: DC
  Administered 2013-06-07 – 2013-06-09 (×3): 40 mg via SUBCUTANEOUS
  Filled 2013-06-06 (×4): qty 0.4

## 2013-06-06 MED ORDER — SODIUM CHLORIDE 0.9 % IV BOLUS (SEPSIS)
1000.0000 mL | Freq: Once | INTRAVENOUS | Status: AC
  Administered 2013-06-06: 1000 mL via INTRAVENOUS

## 2013-06-06 MED ORDER — SODIUM CHLORIDE 0.9 % IV SOLN
INTRAVENOUS | Status: DC
  Administered 2013-06-06: 23:00:00 via INTRAVENOUS

## 2013-06-06 MED ORDER — INSULIN ASPART 100 UNIT/ML ~~LOC~~ SOLN
0.0000 [IU] | Freq: Three times a day (TID) | SUBCUTANEOUS | Status: DC
  Administered 2013-06-07: 2 [IU] via SUBCUTANEOUS
  Administered 2013-06-07: 1 [IU] via SUBCUTANEOUS
  Administered 2013-06-07: 2 [IU] via SUBCUTANEOUS
  Administered 2013-06-08: 3 [IU] via SUBCUTANEOUS
  Administered 2013-06-08: 1 [IU] via SUBCUTANEOUS
  Administered 2013-06-08 – 2013-06-09 (×2): 3 [IU] via SUBCUTANEOUS
  Administered 2013-06-09: 2 [IU] via SUBCUTANEOUS

## 2013-06-06 MED ORDER — ASPIRIN 300 MG RE SUPP
300.0000 mg | Freq: Every day | RECTAL | Status: DC
  Filled 2013-06-06 (×2): qty 1

## 2013-06-06 MED ORDER — ASPIRIN 325 MG PO TABS
325.0000 mg | ORAL_TABLET | Freq: Every day | ORAL | Status: DC
  Administered 2013-06-07: 325 mg via ORAL
  Filled 2013-06-06 (×2): qty 1

## 2013-06-06 NOTE — ED Notes (Signed)
Dr. Plunkett at bedside.  

## 2013-06-06 NOTE — ED Provider Notes (Signed)
CSN: [id number]     Arrival date & time 06/06/13  1735 History   First MD Initiated Contact with Patient 06/06/13 1745     Chief Complaint  Patient presents with  . Cerebrovascular Accident   (Consider location/radiation/quality/duration/timing/severity/associated sxs/prior Treatment) Patient is a 52 y.o. male presenting with Acute Neurological Problem. The history is provided by a relative and the EMS personnel. The history is limited by the condition of the patient.  Cerebrovascular Accident This is a new problem. Episode onset: last seen normal on thursday evening and then family found him this evening on the floor. The problem occurs constantly. The problem has not changed since onset.Associated symptoms comments: Unable to answer questions and not moving the right side. Nothing aggravates the symptoms. Nothing relieves the symptoms. The treatment provided no relief.    Past Medical History  Diagnosis Date  . Diabetes mellitus    History reviewed. No pertinent past surgical history. No family history on file. History  Substance Use Topics  . Smoking status: Former Games developer  . Smokeless tobacco: Not on file  . Alcohol Use: No    Review of Systems  Unable to perform ROS   Allergies  Review of patient's allergies indicates no known allergies.  Home Medications   Current Outpatient Rx  Name  Route  Sig  Dispense  Refill  . Cinnamon 500 MG capsule   Oral   Take 500 mg by mouth daily.         . diazepam (VALIUM) 5 MG tablet   Oral   Take 1 tablet (5 mg total) by mouth every 6 (six) hours as needed for anxiety (spasms).   10 tablet   0   . fish oil-omega-3 fatty acids 1000 MG capsule   Oral   Take 2 g by mouth daily.         Marland Kitchen ibuprofen (ADVIL,MOTRIN) 200 MG tablet   Oral   Take 400 mg by mouth every 6 (six) hours as needed for pain.         Marland Kitchen ibuprofen (ADVIL,MOTRIN) 800 MG tablet   Oral   Take 1 tablet (800 mg total) by mouth 3 (three) times daily.   21  tablet   0   . metFORMIN (GLUCOPHAGE) 500 MG tablet   Oral   Take 500 mg by mouth 2 (two) times daily with a meal.         . oxyCODONE-acetaminophen (PERCOCET) 5-325 MG per tablet   Oral   Take 2 tablets by mouth every 6 (six) hours as needed for pain.   15 tablet   0    BP 166/88  Pulse 110  Temp(Src) 97.7 F (36.5 C) (Oral)  Resp 20  Ht 5\' 5"  (1.651 m)  SpO2 100% Physical Exam  Nursing note and vitals reviewed. Constitutional: He appears well-developed and well-nourished. No distress.  HENT:  Head: Normocephalic and atraumatic.  Mouth/Throat: Oropharynx is clear and moist. Mucous membranes are dry.  Eyes: Conjunctivae and EOM are normal. Pupils are equal, round, and reactive to light.  Neck: Normal range of motion. Neck supple.  Cardiovascular: Regular rhythm and intact distal pulses.  Tachycardia present.   No murmur heard. Pulmonary/Chest: Effort normal and breath sounds normal. No respiratory distress. He has no wheezes. He has no rales.  Abdominal: Soft. He exhibits no distension. There is no tenderness. There is no rebound and no guarding.  Musculoskeletal: Normal range of motion. He exhibits no edema and no tenderness.  Neurological: He is  alert. A cranial nerve deficit is present.  Expressive aphasia, left sided gaze and right sided hemineglect.  Will not move the right side voluntarily however witnessed him moving the right leg.  Right sided facial droop and no 0/5 strength to the right upper ext.  Skin: Skin is warm and dry. No rash noted. No erythema.  Psychiatric: He has a normal mood and affect. His behavior is normal.    ED Course  Procedures (including critical care time) Labs Review Labs Reviewed  CBC - Abnormal; Notable for the following:    WBC 11.5 (*)    MCHC 36.3 (*)    All other components within normal limits  DIFFERENTIAL - Abnormal; Notable for the following:    Neutro Abs 8.3 (*)    Monocytes Absolute 1.3 (*)    All other components  within normal limits  COMPREHENSIVE METABOLIC PANEL - Abnormal; Notable for the following:    Glucose, Bld 265 (*)    All other components within normal limits  URINALYSIS, ROUTINE W REFLEX MICROSCOPIC - Abnormal; Notable for the following:    APPearance CLOUDY (*)    Specific Gravity, Urine 1.037 (*)    Glucose, UA >1000 (*)    Hgb urine dipstick MODERATE (*)    Bilirubin Urine SMALL (*)    Ketones, ur >80 (*)    Protein, ur 100 (*)    All other components within normal limits  CK - Abnormal; Notable for the following:    Total CK 1086 (*)    All other components within normal limits  GLUCOSE, CAPILLARY - Abnormal; Notable for the following:    Glucose-Capillary 325 (*)    All other components within normal limits  URINE MICROSCOPIC-ADD ON - Abnormal; Notable for the following:    Bacteria, UA FEW (*)    Casts HYALINE CASTS (*)    All other components within normal limits  POCT I-STAT, CHEM 8 - Abnormal; Notable for the following:    Potassium 3.3 (*)    Glucose, Bld 272 (*)    All other components within normal limits  ETHANOL  PROTIME-INR  APTT  TROPONIN I  URINE RAPID DRUG SCREEN (HOSP PERFORMED)  CG4 I-STAT (LACTIC ACID)  POCT I-STAT TROPONIN I   Imaging Review Ct Head Wo Contrast  06/06/2013   CLINICAL DATA:  Stroke.  Right-sided paralysis.  A phasic.  EXAM: CT HEAD WITHOUT CONTRAST  TECHNIQUE: Contiguous axial images were obtained from the base of the skull through the vertex without intravenous contrast.  COMPARISON:  05/06/2013  FINDINGS: There is an extensive nonhemorrhagic infarction of the entire left posterior cerebral artery distribution. In addition, there is a nonhemorrhagic infarction in the left posterior inferior cerebellar artery distribution. There is edema associated with these infarcts. There is also a nonhemorrhagic infarction in the left thalamus extending into the left cerebral peduncle.  There is compression of the occipital and temporal horns of the  left lateral ventricle as well as effacement of cortical sulci in the left temporal and occipital regions.  Osseous structures are normal.  There is a 3 mm midline shift from left-to-right.  IMPRESSION: Multiple subacute nonhemorrhagic infarctions including the left cerebellum, left occipital lobe, left temporal lobe, and left thalamus and left cerebral peduncle.  Slight midline shift and mass effect.  No hemorrhage.   Electronically Signed   By: Geanie Cooley M.D.   On: 06/06/2013 19:14    EKG Interpretation     Ventricular Rate:  96 PR Interval:  129  QRS Duration: 86 QT Interval:  342 QTC Calculation: 432 R Axis:   32 Text Interpretation:  Sinus rhythm Normal ECG No significant change since last tracing            MDM   1. CVA (cerebral infarction)   2. Rhabdomyolysis     Patient presenting with symptoms suggestive of a right-sided stroke. Last seen normal 4 days ago. Patient was found on the floor by his family today on exam he has left-sided gaze with right hemi-neglect.  Patient is able to move his right lower extremity but unable to appreciate any movement of the right upper extremity and right-sided facial droop present. He also has expressive aphasia. The patient has a history of diabetes but no prior stroke history. None known how long stroke has been present and unclear how long he was lying on the floor.  Stroke order set initiated. CK pending.  8:07 PM Labs with rhabdo and hyperglycemia but o/w stable.  Ct with extensive stroke.  Neuro to see.  Gwyneth Sprout, MD 06/06/13 2007

## 2013-06-06 NOTE — ED Notes (Signed)
Dr. Rueben Bash at bedside

## 2013-06-06 NOTE — ED Notes (Addendum)
Pt brought from home via [facility] with c/o stroke like symptoms. Pt was LSN Thursday at 1830. Pt's family found pt on floor today. Pt has left sided gaze, right sided paralysis, slurred/incomprehensible speech, follows commands.

## 2013-06-06 NOTE — H&P (Signed)
[facility] History and Physical  Adam Callahan OZH:[id number] DOB: 06-Nov-1960 DOA: 06/06/2013  Referring physician: ER physician. PCP: No PCP Per Patient   Chief Complaint: Unable to talk and right-sided weakness.  HPI: Adam Callahan is a 52 y.o. male with history of diabetes mellitus was brought to the ER after patient's family found him on the floor at his house this evening. Patient was last seen normal on Thursday 3 days ago. And patient's daughter today had come to his house and found him on the floor and EMS was called and patient was found to be having expressive aphasia with right-sided hemiplegia. CT of the head done shows multiple subacute nonhemorrhagic infarcts mild midline shift and mass effect. Neurologist on-call Dr. Amada Jupiter has already evaluated the patient and patient has been admitted for further workup. Patient on my exam is still able to follow commands. But able to not give much history due to aphasia. Patient has mild pain in his lower extremities. As per the family he had a fall last month.   Review of Systems: As presented in the history of presenting illness, rest negative.  Past Medical History  Diagnosis Date  . Diabetes mellitus    History reviewed. No pertinent past surgical history. Social History:  reports that he has quit smoking. He does not have any smokeless tobacco history on file. He reports that he does not drink alcohol or use illicit drugs. Where does patient live home. Can patient participate in ADLs? Usually yes.  No Known Allergies  Family History:  Family History  Problem Relation Age of Onset  . Cervical cancer Mother   . Hypertension Sister   . Diabetes Mellitus II Maternal Uncle       Prior to Admission medications   Medication Sig Start Date End Date Taking? Authorizing Provider  diazepam (VALIUM) 5 MG tablet Take 5 mg by mouth every 6 (six) hours as needed for muscle spasms.   Yes Historical Provider, MD   oxyCODONE-acetaminophen (PERCOCET/ROXICET) 5-325 MG per tablet Take 1 tablet by mouth every 4 (four) hours as needed for severe pain.   Yes Historical Provider, MD    Physical Exam: Filed Vitals:   06/06/13 2100 06/06/13 2115 06/06/13 2130 06/06/13 2145  BP: 148/80 163/88 145/77 148/80  Pulse: 87 89 80 87  Temp:      TempSrc:      Resp: 16 14 13 14   Height:      SpO2: 100% 99% 100% 100%     General:  Well-developed and nourished.  Eyes: Anicteric no pallor.  ENT: No discharge from ears eyes nose mouth.  Neck: No mass felt.  Cardiovascular: S1-S2 heard.  Respiratory: No rhonchi or crepitations.  Abdomen: Soft nontender bowel sounds present.  Skin: No rash.  Musculoskeletal: No edema.  Psychiatric: Patient is aphasic but follows commands.  Neurologic: Alert awake follows commands. Right-sided hemiplegia. Tongue is midline.  Labs on Admission:  Basic Metabolic Panel:  Recent Labs Lab 06/06/13 1806 06/06/13 1837  NA 137 140  K 3.6 3.3*  CL 97 101  CO2 27  --   GLUCOSE 265* 272*  BUN 20 21  CREATININE 0.69 1.00  CALCIUM 9.2  --    Liver Function Tests:  Recent Labs Lab 06/06/13 1806  AST 26  ALT 22  ALKPHOS 71  BILITOT 0.8  PROT 8.1  ALBUMIN 3.7   No results found for this basename: LIPASE, AMYLASE,  in the last 168 hours No results found for this  basename: AMMONIA,  in the last 168 hours CBC:  Recent Labs Lab 06/06/13 1806 06/06/13 1837  WBC 11.5*  --   NEUTROABS 8.3*  --   HGB 15.9 16.0  HCT 43.8 47.0  MCV 84.2  --   PLT 301  --    Cardiac Enzymes:  Recent Labs Lab 06/06/13 1806  CKTOTAL 1086*  TROPONINI <0.30    BNP (last 3 results) No results found for this basename: PROBNP,  in the last 8760 hours CBG:  Recent Labs Lab 06/06/13 1806  GLUCAP 325*    Radiological Exams on Admission: Ct Head Wo Contrast  06/06/2013   CLINICAL DATA:  Stroke.  Right-sided paralysis.  A phasic.  EXAM: CT HEAD WITHOUT CONTRAST   TECHNIQUE: Contiguous axial images were obtained from the base of the skull through the vertex without intravenous contrast.  COMPARISON:  05/06/2013  FINDINGS: There is an extensive nonhemorrhagic infarction of the entire left posterior cerebral artery distribution. In addition, there is a nonhemorrhagic infarction in the left posterior inferior cerebellar artery distribution. There is edema associated with these infarcts. There is also a nonhemorrhagic infarction in the left thalamus extending into the left cerebral peduncle.  There is compression of the occipital and temporal horns of the left lateral ventricle as well as effacement of cortical sulci in the left temporal and occipital regions.  Osseous structures are normal.  There is a 3 mm midline shift from left-to-right.  IMPRESSION: Multiple subacute nonhemorrhagic infarctions including the left cerebellum, left occipital lobe, left temporal lobe, and left thalamus and left cerebral peduncle.  Slight midline shift and mass effect.  No hemorrhage.   Electronically Signed   By: Geanie Cooley M.D.   On: 06/06/2013 19:14    EKG: Independently reviewed. Normal sinus rhythm.  Assessment/Plan Principal Problem:   CVA (cerebral infarction) Active Problems:   Diabetes mellitus   Rhabdomyolysis   1. CVA - have discussed with on-call neurologist Dr. Amada Jupiter. Patient will be admitted to step down for close observation. Patient will be kept n.p.o. MRI/MRA brain 2-D echo and carotid Dopplers. Aspirin per rectally. Further recommendations per neurologist. 2. Ketosis probably from starvation - patient has no anion gap. Closely observe metabolic panel. Continue hydration. 3. Diabetes mellitus type 2 - presently patient is kept n.p.o. so we will follow CBGs 4 hourly. Check hemoglobin A1c. 4. Rhabdomyolysis - probably secondary to the fall. Hydrate and closely follow CK. Troponins are pending.    Code Status: Full code.  Family Communication: Patient's  family at the bedside.  Disposition Plan: Admit to inpatient.    Haniyyah Sakuma N. [facility] Pager [phone].  If 7PM-7AM, please contact night-coverage [email or web address] Password [facility] 06/06/2013, 9:57 PM

## 2013-06-06 NOTE — ED Notes (Signed)
Patient's family requested and received mouth swabs for the patient.

## 2013-06-06 NOTE — Consult Note (Signed)
Neurology Consultation Reason for Consult: Stroke Referring Physician: Gwyneth Sprout  CC: Right sided weakness  History is obtained from:Patient, Family  HPI: Adam Callahan is a 52 y.o. male with a history of diabetes as well as fall one month ago who presents with right-sided weakness that began Thursday evening. He is unable to speak, due to severe dysarthria and possibly aphasia, but does respond appropriately with head nods and shakes.   He was last seen after being dropped off and then was not heard from until today when his daughter found him on the floor. He was brought to the ER where a CT shows PCA and    LKW: Thursday evening, 11/13 tpa given?: no, outside of window.  NIHSS: 21   ROS: A 14 point ROS was unable to be obtained due to dysarthria/possible aphasia  Past Medical History  Diagnosis Date  . Diabetes mellitus     Family History: Unable to assess secondary to patient's altered mental status.    Social History: Tob: occasional ciggarette  Exam: Current vital signs: BP 139/79  Pulse 69  Temp(Src) 97.7 F (36.5 C) (Oral)  Resp 18  Ht 5\' 5"  (1.651 m)  SpO2 100% Vital signs in last 24 hours: Temp:  [97.7 F (36.5 C)] 97.7 F (36.5 C) (11/16 1800) Pulse Rate:  [69-110] 69 (11/16 2045) Resp:  [15-24] 18 (11/16 2045) BP: (139-166)/(75-95) 139/79 mmHg (11/16 2045) SpO2:  [97 %-100 %] 100 % (11/16 2045) FiO2 (%):  [21 %] 21 % (11/16 1806)  General: in bed CV: RRR Mental Status: Patient is awake, alert, unable to answer orientation questions due to severe dysarthria.  Immediate and remote memory are intact. Patient is able to give a clear and coherent history. No signs of  Neglect. I do suspect some expressive aphasia, but is able to reliably and quickly follow all commands. This is difficult due to severe dysarthria.  Cranial Nerves: II: No blink from right, intact on left. Pupils are equal, round, and reactive to light.  Discs are difficult  to visualize. III,IV, VI: Leftward gaze, unable to cross midline to the right.  V: Facial sensation is decreased on left VII: Facial movement is decreased on right VIII: hearing is intact to voice X: Poor palatal elevation XI: Shoulder shrug is decreased on right XII: tongue deviates to the right Motor: Tone is normal. Bulk is normal. 5/5 strength was present on the left, no movement on the right.  Sensory: Sensation is diminished on the right, but does grimace to nox stim Deep Tendon Reflexes: 2+ and symmetric in the biceps and patellae.  Cerebellar: FNF intact on left Gait: Not tested due to profound weakness.    I have reviewed labs in epic and the results pertinent to this consultation are: CMP - elevated BG CBC - leukocytosis.   I have reviewed the images obtained: CT head - left PCA and PICA distribution infarcts.   Impression:  52 yo M with left PCA and PICA infarcts on 11/13. I suspect a posterior circulation embolus though vertebral dissection or vertebral occlusion are also possible. He will need to be closely monitored.   Recommendations: 1. HgbA1c, fasting lipid panel 2. MRI, MRA  of the brain without contrast, MRA neck with contrast 3. Frequent neuro checks(Q2H) 4. Echocardiogram 5. Carotid dopplers are not needed as MRA of the neck will be performed.  6. Prophylactic therapy-Antiplatelet med: Aspirin - dose 300mg  PR 7. Risk factor modification 8. Telemetry monitoring 9. PT consult, OT consult  10. Speech consult with no PO intake prior to their evaluation.     Ritta Slot, MD [facility] [phone]  If 7pm- 7am, please page neurology on call at [phone].

## 2013-06-06 NOTE — ED Notes (Signed)
Attempted Report 

## 2013-06-07 ENCOUNTER — Inpatient Hospital Stay (HOSPITAL_COMMUNITY): Payer: BC Managed Care – PPO

## 2013-06-07 DIAGNOSIS — I6350 Cerebral infarction due to unspecified occlusion or stenosis of unspecified cerebral artery: Secondary | ICD-10-CM

## 2013-06-07 DIAGNOSIS — I517 Cardiomegaly: Secondary | ICD-10-CM

## 2013-06-07 DIAGNOSIS — E860 Dehydration: Secondary | ICD-10-CM | POA: Diagnosis present

## 2013-06-07 DIAGNOSIS — E876 Hypokalemia: Secondary | ICD-10-CM | POA: Diagnosis present

## 2013-06-07 DIAGNOSIS — E785 Hyperlipidemia, unspecified: Secondary | ICD-10-CM | POA: Diagnosis present

## 2013-06-07 LAB — LIPID PANEL
Cholesterol: 204 mg/dL — ABNORMAL HIGH (ref 0–200)
HDL: 42 mg/dL (ref >39)
LDL Cholesterol: 144 mg/dL — ABNORMAL HIGH (ref 0–99)
Triglycerides: 92 mg/dL (ref <150)

## 2013-06-07 LAB — CBC WITH DIFFERENTIAL/PLATELET
Basophils Absolute: 0 10*3/uL (ref 0.0–0.1)
Basophils Relative: 0 % (ref 0–1)
Eosinophils Relative: 1 % (ref 0–5)
HCT: 40.2 % (ref 39.0–52.0)
Hemoglobin: 13.4 g/dL (ref 13.0–17.0)
Lymphocytes Relative: 23 % (ref 12–46)
Lymphs Abs: 2.1 10*3/uL (ref 0.7–4.0)
MCH: 28.1 pg (ref 26.0–34.0)
MCV: 84.3 fL (ref 78.0–100.0)
Monocytes Absolute: 1.1 10*3/uL — ABNORMAL HIGH (ref 0.1–1.0)
Monocytes Relative: 12 % (ref 3–12)
Neutro Abs: 6 10*3/uL (ref 1.7–7.7)
RDW: 12.5 % (ref 11.5–15.5)
WBC: 9.4 10*3/uL (ref 4.0–10.5)

## 2013-06-07 LAB — GLUCOSE, CAPILLARY
Glucose-Capillary: 132 mg/dL — ABNORMAL HIGH (ref 70–99)
Glucose-Capillary: 236 mg/dL — ABNORMAL HIGH (ref 70–99)

## 2013-06-07 LAB — COMPREHENSIVE METABOLIC PANEL
ALT: 18 U/L (ref 0–53)
AST: 21 U/L (ref 0–37)
Alkaline Phosphatase: 63 U/L (ref 39–117)
BUN: 17 mg/dL (ref 6–23)
CO2: 27 mEq/L (ref 19–32)
GFR calc Af Amer: >90 mL/min (ref >90)
Glucose, Bld: 187 mg/dL — ABNORMAL HIGH (ref 70–99)
Potassium: 3.3 mEq/L — ABNORMAL LOW (ref 3.5–5.1)
Sodium: 141 mEq/L (ref 135–145)
Total Bilirubin: 0.7 mg/dL (ref 0.3–1.2)
Total Protein: 7.4 g/dL (ref 6.0–8.3)

## 2013-06-07 LAB — MRSA PCR SCREENING: MRSA by PCR: NEGATIVE

## 2013-06-07 LAB — TSH: TSH: 0.478 u[IU]/mL (ref 0.350–4.500)

## 2013-06-07 MED ORDER — POTASSIUM CHLORIDE IN NACL 20-0.9 MEQ/L-% IV SOLN
INTRAVENOUS | Status: DC
  Administered 2013-06-07 – 2013-06-09 (×3): via INTRAVENOUS
  Filled 2013-06-07 (×7): qty 1000

## 2013-06-07 MED ORDER — INSULIN GLARGINE 100 UNIT/ML ~~LOC~~ SOLN
5.0000 [IU] | Freq: Every day | SUBCUTANEOUS | Status: DC
Start: 2013-06-07 — End: 2013-06-09
  Administered 2013-06-07 – 2013-06-09 (×3): 5 [IU] via SUBCUTANEOUS
  Filled 2013-06-07 (×3): qty 0.05

## 2013-06-07 MED ORDER — INFLUENZA VAC SPLIT QUAD 0.5 ML IM SUSP
0.5000 mL | INTRAMUSCULAR | Status: AC
  Administered 2013-06-08: 0.5 mL via INTRAMUSCULAR
  Filled 2013-06-07: qty 0.5

## 2013-06-07 MED ORDER — CLOPIDOGREL BISULFATE 75 MG PO TABS
75.0000 mg | ORAL_TABLET | Freq: Every day | ORAL | Status: DC
  Administered 2013-06-07 – 2013-06-09 (×3): 75 mg via ORAL
  Filled 2013-06-07 (×4): qty 1

## 2013-06-07 MED ORDER — GADOBENATE DIMEGLUMINE 529 MG/ML IV SOLN
20.0000 mL | Freq: Once | INTRAVENOUS | Status: AC
  Administered 2013-06-07: 20 mL via INTRAVENOUS

## 2013-06-07 MED ORDER — PNEUMOCOCCAL VAC POLYVALENT 25 MCG/0.5ML IJ INJ
0.5000 mL | INJECTION | INTRAMUSCULAR | Status: AC
  Administered 2013-06-08: 0.5 mL via INTRAMUSCULAR
  Filled 2013-06-07: qty 0.5

## 2013-06-07 NOTE — Progress Notes (Signed)
*  PRELIMINARY RESULTS* Echocardiogram 2D Echocardiogram has been performed.  Adam Callahan 06/07/2013, 1:06 PM

## 2013-06-07 NOTE — Evaluation (Signed)
Clinical/Bedside Swallow Evaluation Patient Details  Name: Adam Callahan MRN: [id number] Date of Birth: November 17, 1960  Today's Date: 06/07/2013 Time: 1400-1420 SLP Time Calculation (min): 20 min  Past Medical History:  Past Medical History  Diagnosis Date  . Diabetes mellitus    Past Surgical History: History reviewed. No pertinent past surgical history. 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  Found on the floor of his home of indeterminate time so also with rhabdomyolosis, ketosis.  Pt has DM at baseline and hx of CVA without residual deficits.    Assessment / Plan / Recommendation Clinical Impression  Pt presents with moderate-severe cognitive-linguistic and speech deficits s/p multiple acute infarcts. Speech is severely dysarthric due to fast rate and imprecise articulation due to decreased labial/lingual ROM and strength, with intelligibility reduced at the word level. He exhibits impairments in confrontational naming with perseverative errors, orientation to time and location, sustained attention, right inattention, basic problem solving, and safety/judgment with impulsivity observed. Pt will benefit from skilled SLP services to maximize functional communication and cognitive function. Highly recommend inpatient rehab for next level of care.    Aspiration Risk  Mild    Diet Recommendation Dysphagia 1 (Puree);Thin liquid   Liquid Administration via: Cup;No straw Medication Administration: Crushed with puree Supervision: Patient able to self feed;Full supervision/cueing for compensatory strategies Compensations: Slow rate;Small sips/bites;Check for pocketing;Check for anterior loss;Follow solids with liquid Postural Changes and/or Swallow Maneuvers: Seated upright 90 degrees;Upright 30-60 min after meal    Other  Recommendations Oral Care Recommendations: Oral care BID   Follow Up Recommendations  Inpatient Rehab     Frequency and Duration min 2x/week  2 weeks   Pertinent Vitals/Pain N/A    SLP Swallow Goals  Please see Care Plan for goals set 06/07/13.   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  Found on the floor of his home of indeterminate time so also with rhabdomyolosis, ketosis.  Pt has DM at baseline and hx of CVA without residual deficits.  Type of Study: Bedside swallow evaluation Previous Swallow Assessment: none in chart Diet Prior to this Study: NPO;IV Temperature Spikes Noted: Yes (low grade fever) Respiratory Status: Room air History of Recent Intubation: No Behavior/Cognition: Alert;Cooperative;Requires cueing Oral Cavity - Dentition: Missing dentition Self-Feeding Abilities: Able to feed self;Needs assist Patient Positioning: Upright in chair Baseline Vocal Quality: Hoarse (mildly hoarse, good vocal intensity) Volitional Cough: Weak Volitional Swallow: Able to elicit (adequate hyolaryngeal movement upon palpation)    Oral/Motor/Sensory Function Overall Oral Motor/Sensory Function: Impaired Labial ROM: Reduced right Labial Symmetry: Abnormal symmetry right Labial Strength: Reduced Labial Sensation: Reduced Lingual ROM: Reduced right Lingual Symmetry: Abnormal symmetry right Lingual Strength: Reduced Facial ROM: Reduced right Facial Symmetry: Right droop Facial Strength: Reduced Mandible: Within Functional Limits   Ice Chips Ice chips: Impaired Presentation: Spoon Pharyngeal Phase Impairments:  Suspected delayed Swallow   Thin Liquid Thin Liquid: Impaired Presentation: Cup;Self Fed;Straw Oral Phase Impairments: Poor awareness of bolus Oral Phase Functional Implications: Right anterior spillage Pharyngeal  Phase  Impairments: Suspected delayed Swallow;Wet Vocal Quality Other Comments: pt impulsive with intake and demonstrated a wet vocal quality only after taking several large consecutive sips at a time    Nectar Thick Nectar Thick Liquid: Not tested   Honey Thick Honey Thick Liquid: Not tested   Puree Puree: Impaired Presentation: Spoon;Self Fed Pharyngeal Phase Impairments: Suspected delayed Swallow   Solid   GO    Solid: Impaired Presentation: Self Fed Oral Phase Impairments: Reduced lingual movement/coordination;Impaired anterior to posterior transit;Poor awareness of bolus Oral Phase Functional Implications: Right anterior spillage;Oral residue Pharyngeal Phase Impairments: Suspected delayed [name] 06/07/2013,3:25 PM  [name], M.A. CCC-SLP [phone]

## 2013-06-07 NOTE — Evaluation (Signed)
Physical Therapy Evaluation Patient Details Name: Adam Callahan MRN: [id number] DOB: 1960/12/08 Today's Date: 06/07/2013 Time: 4098-1191 PT Time Calculation (min): 31 min  PT Assessment / Plan / Recommendation History of Present Illness  Pt admit with multiple infarcts of L cerebellum, occipital lobe, thalamus, cerebral peduncle.  Found on the floor of his home of indeterminate time so also with rhabdomyolosis, ketosis.  Pt has DM at baseline and hx of CVA without residual deficits.  Clinical Impression  Pt admitted with above. Pt currently with functional limitations due to the deficits listed below (see PT Problem List). Feel pt will be a good rehab candidate.  Pt very motivated.  Pt will benefit from skilled PT to increase their independence and safety with mobility to allow discharge to the venue listed below.     PT Assessment  Patient needs continued PT services    Follow Up Recommendations  CIR;Supervision/Assistance - 24 hour                Equipment Recommendations  Other (comment) (TBA)    Recommendations for Other Services Rehab consult   Frequency Min 3X/week    Precautions / Restrictions Precautions Precautions: Fall Restrictions Weight Bearing Restrictions: No   Pertinent Vitals/Pain VSS, no pain      Mobility  Bed Mobility Bed Mobility: Rolling Left;Left Sidelying to Sit;Sitting - Scoot to [place] of Bed Rolling Left: 4: Min assist;With rail Left Sidelying to Sit: 4: Min assist;With rails;HOB elevated Sitting - Scoot to [place] of Bed: 4: Min assist Details for Bed Mobility Assistance: Assist for sequencing and technique with pt moving impulsively to get to EOB.  Soaked with urine therefore assisted nursing with changing gown and cleaning urine off of skin.   Transfers Transfers: Sit to Stand;Stand to Sit;Stand Pivot Transfers Sit to Stand: 1: +2 Total assist;With upper extremity assist;From bed Sit to Stand: Patient Percentage: 50% Stand to Sit: 1: +2  Total assist;With upper extremity assist;To chair/3-in-1;With armrests Stand to Sit: Patient Percentage: 50% Stand Pivot Transfers: 1: +2 Total assist Stand Pivot Transfers: Patient Percentage: 50% Details for Transfer Assistance: Pt needed assist for sit to stand using power up from left hemibody.  Pt able to weight bear on right LE but needed support of right knee to maintain standing.  Pt pivoted to chair on left LE mostly with assist to weight shift.  Needed cues to posture as pt was flexed most of time.   Ambulation/Gait Ambulation/Gait Assistance: Not tested (comment) Stairs: No Wheelchair Mobility Wheelchair Mobility: No Modified Rankin (Stroke Patients Only) Pre-Morbid Rankin Score: No symptoms Modified Rankin: Severe disability         PT Diagnosis: Generalized weakness  PT Problem List: Decreased activity tolerance;Decreased balance;Decreased mobility;Decreased knowledge of use of DME;Decreased safety awareness;Decreased knowledge of precautions;Decreased coordination;Decreased strength PT Treatment Interventions: DME instruction;Gait training;Functional mobility training;Therapeutic activities;Therapeutic exercise;Patient/family education;Cognitive remediation;Neuromuscular re-education;Balance training;Wheelchair mobility training     PT Goals(Current goals can be found in the care plan section) Acute Rehab PT Goals Patient Stated Goal: to go home PT Goal Formulation: With patient Time For Goal Achievement: 06/14/13 Potential to Achieve Goals: Good  Visit Information  Last PT Received On: 06/07/13 Assistance Needed: +2 PT/OT Co-Evaluation/Treatment: Yes History of Present Illness: Pt admit with multiple infarcts of L cerebellum, occipital lobe, thalamus, cerebral peduncle.  Found on the floor of his home of indeterminate time so also with rhabdomyolosis, ketosis.  Pt has DM at baseline and hx of CVA without residual deficits.  Prior Functioning  Home  Living Family/patient expects to be discharged to:: Private residence (Aunt's home) Living Arrangements: Alone Available Help at Discharge: Family;Available 24 hours/day Type of Home: House Home Access: Level entry Home Layout: One level (Aunt has converted garage into a room.) Home Equipment: None Prior Function Level of Independence: Independent Comments: Driving, pastor Communication Communication: Expressive difficulties Dominant Hand: Right    Cognition  Cognition Arousal/Alertness: Awake/alert Behavior During Therapy: Impulsive (labile) Overall Cognitive Status: Impaired/Different from baseline Area of Impairment: Attention;Following commands;Safety/judgement;Awareness;Problem solving Current Attention Level: Focused Following Commands: Follows one step commands with increased time (Questionable neglect of right side ) Safety/Judgement: Decreased awareness of safety;Decreased awareness of deficits Problem Solving: Slow processing;Difficulty sequencing;Requires verbal cues;Requires tactile cues General Comments: All of above difficult to assess secondary to language difficulties.  Emotional lability at end of session.      Extremity/Trunk Assessment Upper Extremity Assessment Upper Extremity Assessment: RUE deficits/detail Lower Extremity Assessment Lower Extremity Assessment: RLE deficits/detail RLE Deficits / Details: Brunstrom III RLE Sensation: decreased light touch RLE Coordination: decreased gross motor Cervical / Trunk Assessment Cervical / Trunk Assessment: Normal   Balance Balance Balance Assessed: Yes Static Sitting Balance Static Sitting - Balance Support: Left upper extremity supported;Feet supported Static Sitting - Level of Assistance: 4: Min assist Static Sitting - Comment/# of Minutes: Sat EOB to wash back and change gown for 3 min with min assist with left lean.   Static Standing Balance Static Standing - Balance Support: No upper extremity  supported;During functional activity Static Standing - Level of Assistance: 3: Mod assist Static Standing - Comment/# of Minutes: 2 min to be cleaned with 2 person assist with flexed posture and blocking right knee.  Once in chair, positioned with pillow on left side to try and improve sitting to midline.  See OT note regarding issues with midline.    End of Session PT - End of Session Equipment Utilized During Treatment: Gait belt Activity Tolerance: Patient limited by fatigue Patient left: in chair;with call bell/phone within reach;with family/visitor present;with nursing/sitter in room Nurse Communication: Mobility status;Need for lift equipment       INGOLD,Erika Slaby 06/07/2013, 2:10 PM [facility] [phone] (pager)

## 2013-06-07 NOTE — Clinical Social Work Note (Signed)
CSW received call from [facility] requesting a letter for pt's daughter Cejie explaining that pt is in the hospital and that she has been tending to him during his stay. CSW has provided this letter to Cejie. CSW signing off as no other CSW needs identified at this time.   Maryclare Labrador, MSW, [facility] Clinical Social Worker [phone]

## 2013-06-07 NOTE — Progress Notes (Signed)
Called to review MRI/A: Acute infarct left posterior cerebral artery territory involving the left inferior cerebellum, and left temporal and occipital lobe as well as the left thalamus. There appears to be in embolus the left posterior cerebral artery. Mild amount of hemorrhage in the left posterior inferior cerebellar artery infarct.Segmental irregularity left vertebral artery most likely a cause for embolus causing acute infarction. Favor dissection.   Will change from aspirin 300PR daily to Plavix 75mg  daily plus aspirin 81mg  daily. Patient has just been started on a diet today, on exam severe dysarthria. I will start plavix tonight to make sure that he can swallow meds and if he is able, then switch aspirin to 81mg  in the am.  MRI results discussed with Dr. Pearlean Brownie. I discussed plan with primary team.  Gwendolyn Lima. Manson Passey, [facility], MBA, MHA [facility] Pager: [phone] 06/07/2013 3:21 PM

## 2013-06-07 NOTE — Consult Note (Signed)
Physical Medicine and Rehabilitation Consult Reason for Consult: CVA Referring Physician: [facility]   HPI: Adam Callahan is a 52 y.o. right-handed male with history of diabetes mellitus with peripheral neuropathy. I will put her patient was independent prior to admission driving and working as a Education officer, environmental. Admitted 06/06/2013 with acute right-sided weakness and aphasia after being found on the floor by his family.  MRI of the brain acute infarct left PCA territory. There is acute infarct involving the left mid and posterior temporal lobe extending into the left occipital lobe as well acute infarct present left thalamus and left midbrain, left inferior cerebellum. MRA of the head negative for aneurysm. MRI of the neck with occlusions of the left posterior cerebral artery and left posterior inferior cerebellar artery compatible with acute infarct. Echocardiogram with ejection fraction of 55% no wall motion abnormalities and grade 1 diastolic dysfunction. Patient did not receive TPA. Neurology services consulted presently on aspirin and Plavix for CVA prophylaxis as well as subcutaneous Lovenox for DVT prophylaxis. Presently maintained on a dysphagia 1 thin liquid diet. Hemoglobin A1c 10.9 with insulin therapy as directed. Physical and occupational therapy evaluation completed 06/07/2013 with recommendations of physical medicine rehabilitation consult to consider inpatient rehabilitation services.   Review of Systems  Unable to perform ROS: language   Past Medical History  Diagnosis Date  . Diabetes mellitus    History reviewed. No pertinent past surgical history. Family History  Problem Relation Age of Onset  . Cervical cancer Mother   . Hypertension Sister   . Diabetes Mellitus II Maternal Uncle    Social History:  reports that he has quit smoking. He does not have any smokeless tobacco history on file. He reports that he does not drink alcohol or use illicit drugs. Allergies: No Known  Allergies Medications Prior to Admission  Medication Sig Dispense Refill  . diazepam (VALIUM) 5 MG tablet Take 5 mg by mouth every 6 (six) hours as needed for muscle spasms.      Marland Kitchen oxyCODONE-acetaminophen (PERCOCET/ROXICET) 5-325 MG per tablet Take 1 tablet by mouth every 4 (four) hours as needed for severe pain.        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, pastor Functional Status:  Mobility: Bed Mobility Bed Mobility: Rolling Left;Left Sidelying to Sit;Sitting - Scoot to [place] of Bed Rolling Left: 4: Min assist;With rail Left Sidelying to Sit: 4: Min assist;With rails;HOB elevated Sitting - Scoot to [place] of Bed: 4: Min assist Transfers Transfers: Sit to Stand;Stand to [employer] Transfers Sit to Stand: 1: +2 Total assist;With upper extremity assist;From bed Sit to Stand: Patient Percentage: 50% Stand to Sit: 1: +2 Total assist;With upper extremity assist;To chair/3-in-1;With armrests Stand to Sit: Patient Percentage: 50% Stand Pivot Transfers: 1: +2 Total assist Stand Pivot Transfers: Patient Percentage: 50% Ambulation/Gait Ambulation/Gait Assistance: Not tested (comment) Stairs: No Wheelchair Mobility Wheelchair Mobility: No  ADL: ADL Eating/Feeding: NPO Where Assessed - Eating/Feeding: Chair Grooming: Teeth care;Minimal assistance (toothbrush with water) Upper Body Bathing: Maximal assistance  Where Assessed - Upper Body Bathing:  Supported sitting Lower Body Bathing: +1 Total assistance Where Assessed - Lower Body Bathing: Supported sitting;Supported sit to stand Upper Body Dressing: Maximal assistance Where Assessed - Upper Body Dressing: Supported sitting Lower Body Dressing: +1 Total assistance Where Assessed - Lower Body Dressing: Supine, head of bed up Toilet Transfer: +2 Total assistance Toilet Transfer Method: Sit to stand Equipment Used: Gait belt Transfers/Ambulation Related to ADLs: +2 total assist pt 50% to transfer toward L side, non ambulatory  Cognition: Cognition Overall Cognitive Status: Impaired/Different from baseline Orientation Level: Disoriented to place;Oriented to person;Disoriented to time;Oriented to situation Attention: Focused Focused Attention: Impaired Focused Attention Impairment: Functional basic Memory: Impaired Memory Impairment: Decreased recall of new information Awareness: Impaired Awareness Impairment: Intellectual impairment Problem Solving: Impaired Problem Solving Impairment: Functional basic;Verbal basic Safety/Judgment: Impaired Cognition Arousal/Alertness: Awake/alert Behavior During Therapy: Impulsive (labile) Overall Cognitive Status: Impaired/Different from baseline Area of Impairment: Attention;Following commands;Safety/judgement;Awareness;Problem solving Current Attention Level: Focused Following Commands: Follows one step commands with increased time (Questionable neglect of right side ) Safety/Judgement: Decreased awareness of safety;Decreased awareness of deficits Problem Solving: Slow processing;Difficulty sequencing;Requires verbal cues;Requires tactile cues General Comments: All of above difficult to assess secondary to language difficulties.  Emotional lability at end of session.    Blood pressure 135/72, pulse 88, temperature 97.6 F (36.4 C), temperature source Axillary, resp. rate 14, height 5\' 5"  (1.651 m), weight 77.5 kg (170 lb 13.7 oz), SpO2  97.00%. Physical Exam  Vitals reviewed. Eyes:  Pupils round and reactive to light without nystagmus  Neck: Normal range of motion. Neck supple. No thyromegaly present.  Cardiovascular: Normal rate and regular rhythm.   Respiratory: Effort normal and breath sounds normal. No respiratory distress.  GI: Soft. Bowel sounds are normal. He exhibits no distension.  Musculoskeletal: He exhibits no edema.  Neurological: He is alert.  Patient is alert and makes good eye contact with examiner. He does use yes no head nods but inconsistent. Patients words were mostly jargon. He would follow basic demonstrate commands. RUE with trace grasp and proximal movement. He appeared to attempt movement with his right HF and KE but was inconsistent. Minimal movement at the right ankle. Right facial weakness. Gaze dysconjugate.  Skin: Skin is warm and dry.    Results for orders placed during the hospital encounter of 06/06/13 (from the past 24 hour(s))  ETHANOL     Status: None   Collection Time    06/06/13  6:06 PM      Result Value Range   Alcohol, Ethyl (B) <11  0 - 11 mg/dL  PROTIME-INR     Status: None   Collection Time    06/06/13  6:06 PM      Result Value Range   Prothrombin Time 13.4  11.6 - 15.2 seconds   INR 1.04  0.00 - 1.49  APTT     Status: None   Collection Time    06/06/13  6:06 PM      Result Value Range   aPTT 31  24 - 37 seconds  CBC     Status: Abnormal   Collection Time    06/06/13  6:06 PM      Result Value Range   WBC 11.5 (*) 4.0 - 10.5 K/uL   RBC 5.20  4.22 - 5.81 MIL/uL   Hemoglobin 15.9  13.0 - 17.0 g/dL   HCT 16.1  09.6 - 04.5 %   MCV 84.2  78.0 - 100.0 fL  MCH 30.6  26.0 - 34.0 pg   MCHC 36.3 (*) 30.0 - 36.0 g/dL   RDW 65.7  84.6 - 96.2 %   Platelets 301  150 - 400 K/uL  DIFFERENTIAL     Status: Abnormal   Collection Time    06/06/13  6:06 PM      Result Value Range   Neutrophils Relative % 72  43 - 77 %   Neutro Abs 8.3 (*) 1.7 - 7.7 K/uL   Lymphocytes  Relative 17  12 - 46 %   Lymphs Abs 2.0  0.7 - 4.0 K/uL   Monocytes Relative 11  3 - 12 %   Monocytes Absolute 1.3 (*) 0.1 - 1.0 K/uL   Eosinophils Relative 0  0 - 5 %   Eosinophils Absolute 0.0  0.0 - 0.7 K/uL   Basophils Relative 0  0 - 1 %   Basophils Absolute 0.0  0.0 - 0.1 K/uL  COMPREHENSIVE METABOLIC PANEL     Status: Abnormal   Collection Time    06/06/13  6:06 PM      Result Value Range   Sodium 137  135 - 145 mEq/L   Potassium 3.6  3.5 - 5.1 mEq/L   Chloride 97  96 - 112 mEq/L   CO2 27  19 - 32 mEq/L   Glucose, Bld 265 (*) 70 - 99 mg/dL   BUN 20  6 - 23 mg/dL   Creatinine, Ser 9.52  0.50 - 1.35 mg/dL   Calcium 9.2  8.4 - 84.1 mg/dL   Total Protein 8.1  6.0 - 8.3 g/dL   Albumin 3.7  3.5 - 5.2 g/dL   AST 26  0 - 37 U/L   ALT 22  0 - 53 U/L   Alkaline Phosphatase 71  39 - 117 U/L   Total Bilirubin 0.8  0.3 - 1.2 mg/dL   GFR calc non Af Amer >90  >90 mL/min   GFR calc Af Amer >90  >90 mL/min  TROPONIN I     Status: None   Collection Time    06/06/13  6:06 PM      Result Value Range   Troponin I <0.30  <0.30 ng/mL  CK     Status: Abnormal   Collection Time    06/06/13  6:06 PM      Result Value Range   Total CK 1086 (*) 7 - 232 U/L  GLUCOSE, CAPILLARY     Status: Abnormal   Collection Time    06/06/13  6:06 PM      Result Value Range   Glucose-Capillary 325 (*) 70 - 99 mg/dL  URINE RAPID DRUG SCREEN (HOSP PERFORMED)     Status: None   Collection Time    06/06/13  6:31 PM      Result Value Range   Opiates NONE DETECTED  NONE DETECTED   Cocaine NONE DETECTED  NONE DETECTED   Benzodiazepines NONE DETECTED  NONE DETECTED   Amphetamines NONE DETECTED  NONE DETECTED   Tetrahydrocannabinol NONE DETECTED  NONE DETECTED   Barbiturates NONE DETECTED  NONE DETECTED  URINALYSIS, ROUTINE W REFLEX MICROSCOPIC     Status: Abnormal   Collection Time    06/06/13  6:31 PM      Result Value Range   Color, Urine YELLOW  YELLOW   APPearance CLOUDY (*) CLEAR   Specific  Gravity, Urine 1.037 (*) 1.005 - 1.030   pH 5.0  5.0 - 8.0   Glucose,  UA >1000 (*) NEGATIVE mg/dL   Hgb urine dipstick MODERATE (*) NEGATIVE   Bilirubin Urine SMALL (*) NEGATIVE   Ketones, ur >80 (*) NEGATIVE mg/dL   Protein, ur 161 (*) NEGATIVE mg/dL   Urobilinogen, UA 1.0  0.0 - 1.0 mg/dL   Nitrite NEGATIVE  NEGATIVE   Leukocytes, UA NEGATIVE  NEGATIVE  URINE MICROSCOPIC-ADD ON     Status: Abnormal   Collection Time    06/06/13  6:31 PM      Result Value Range   Squamous Epithelial / LPF RARE  RARE   WBC, UA 0-2  <3 WBC/hpf   RBC / HPF 3-6  <3 RBC/hpf   Bacteria, UA FEW (*) RARE   Casts HYALINE CASTS (*) NEGATIVE   Urine-Other AMORPHOUS URATES/PHOSPHATES    POCT I-STAT, CHEM 8     Status: Abnormal   Collection Time    06/06/13  6:37 PM      Result Value Range   Sodium 140  135 - 145 mEq/L   Potassium 3.3 (*) 3.5 - 5.1 mEq/L   Chloride 101  96 - 112 mEq/L   BUN 21  6 - 23 mg/dL   Creatinine, Ser 0.96  0.50 - 1.35 mg/dL   Glucose, Bld 045 (*) 70 - 99 mg/dL   Calcium, Ion 4.09  8.11 - 1.23 mmol/L   TCO2 26  0 - 100 mmol/L   Hemoglobin 16.0  13.0 - 17.0 g/dL   HCT 91.4  78.2 - 95.6 %  CG4 I-STAT (LACTIC ACID)     Status: None   Collection Time    06/06/13  6:41 PM      Result Value Range   Lactic Acid, Venous 2.05  0.5 - 2.2 mmol/L  POCT I-STAT TROPONIN I     Status: None   Collection Time    06/06/13  6:49 PM      Result Value Range   Troponin i, poc 0.00  0.00 - 0.08 ng/mL   Comment 3           MRSA PCR SCREENING     Status: None   Collection Time    06/06/13 10:29 PM      Result Value Range   MRSA by PCR NEGATIVE  NEGATIVE  TROPONIN I     Status: None   Collection Time    06/06/13 10:52 PM      Result Value Range   Troponin I <0.30  <0.30 ng/mL  TSH     Status: None   Collection Time    06/06/13 10:52 PM      Result Value Range   TSH 0.478  0.350 - 4.500 uIU/mL  GLUCOSE, CAPILLARY     Status: Abnormal   Collection Time    06/07/13 12:09 AM      Result  Value Range   Glucose-Capillary 224 (*) 70 - 99 mg/dL  GLUCOSE, CAPILLARY     Status: Abnormal   Collection Time    06/07/13  4:56 AM      Result Value Range   Glucose-Capillary 209 (*) 70 - 99 mg/dL  COMPREHENSIVE METABOLIC PANEL     Status: Abnormal   Collection Time    06/07/13  5:25 AM      Result Value Range   Sodium 141  135 - 145 mEq/L   Potassium 3.3 (*) 3.5 - 5.1 mEq/L   Chloride 103  96 - 112 mEq/L   CO2 27  19 - 32 mEq/L  Glucose, Bld 187 (*) 70 - 99 mg/dL   BUN 17  6 - 23 mg/dL   Creatinine, Ser 1.47  0.50 - 1.35 mg/dL   Calcium 8.7  8.4 - 82.9 mg/dL   Total Protein 7.4  6.0 - 8.3 g/dL   Albumin 3.2 (*) 3.5 - 5.2 g/dL   AST 21  0 - 37 U/L   ALT 18  0 - 53 U/L   Alkaline Phosphatase 63  39 - 117 U/L   Total Bilirubin 0.7  0.3 - 1.2 mg/dL   GFR calc non Af Amer >90  >90 mL/min   GFR calc Af Amer >90  >90 mL/min  CK     Status: Abnormal   Collection Time    06/07/13  5:25 AM      Result Value Range   Total CK 754 (*) 7 - 232 U/L  CBC WITH DIFFERENTIAL     Status: Abnormal   Collection Time    06/07/13  5:25 AM      Result Value Range   WBC 9.4  4.0 - 10.5 K/uL   RBC 4.77  4.22 - 5.81 MIL/uL   Hemoglobin 13.4  13.0 - 17.0 g/dL   HCT 56.2  13.0 - 86.5 %   MCV 84.3  78.0 - 100.0 fL   MCH 28.1  26.0 - 34.0 pg   MCHC 33.3  30.0 - 36.0 g/dL   RDW 78.4  69.6 - 29.5 %   Platelets 293  150 - 400 K/uL   Neutrophils Relative % 64  43 - 77 %   Neutro Abs 6.0  1.7 - 7.7 K/uL   Lymphocytes Relative 23  12 - 46 %   Lymphs Abs 2.1  0.7 - 4.0 K/uL   Monocytes Relative 12  3 - 12 %   Monocytes Absolute 1.1 (*) 0.1 - 1.0 K/uL   Eosinophils Relative 1  0 - 5 %   Eosinophils Absolute 0.1  0.0 - 0.7 K/uL   Basophils Relative 0  0 - 1 %   Basophils Absolute 0.0  0.0 - 0.1 K/uL  HEMOGLOBIN A1C     Status: Abnormal   Collection Time    06/07/13  5:25 AM      Result Value Range   Hemoglobin A1C 10.9 (*) <5.7 %   Mean Plasma Glucose 266 (*) <117 mg/dL  LIPID PANEL      Status: Abnormal   Collection Time    06/07/13  5:25 AM      Result Value Range   Cholesterol 204 (*) 0 - 200 mg/dL   Triglycerides 92  <284 mg/dL   HDL 42  >13 mg/dL   Total CHOL/HDL Ratio 4.9     VLDL 18  0 - 40 mg/dL   LDL Cholesterol 244 (*) 0 - 99 mg/dL   Ct Head Wo Contrast  06/06/2013   CLINICAL DATA:  Stroke.  Right-sided paralysis.  A phasic.  EXAM: CT HEAD WITHOUT CONTRAST  TECHNIQUE: Contiguous axial images were obtained from the base of the skull through the vertex without intravenous contrast.  COMPARISON:  05/06/2013  FINDINGS: There is an extensive nonhemorrhagic infarction of the entire left posterior cerebral artery distribution. In addition, there is a nonhemorrhagic infarction in the left posterior inferior cerebellar artery distribution. There is edema associated with these infarcts. There is also a nonhemorrhagic infarction in the left thalamus extending into the left cerebral peduncle.  There is compression of the occipital and temporal horns of  the left lateral ventricle as well as effacement of cortical sulci in the left temporal and occipital regions.  Osseous structures are normal.  There is a 3 mm midline shift from left-to-right.  IMPRESSION: Multiple subacute nonhemorrhagic infarctions including the left cerebellum, left occipital lobe, left temporal lobe, and left thalamus and left cerebral peduncle.  Slight midline shift and mass effect.  No hemorrhage.   Electronically Signed   By: Geanie Cooley M.D.   On: 06/06/2013 19:14   Mr Angiogram Neck W Wo Contrast  06/07/2013   CLINICAL DATA:  Right-sided weakness.  Stroke.  Fall 1 month ago.  EXAM: MRI HEAD WITHOUT AND WITH CONTRAST AND MRA HEAD WITHOUT AND WITH CONTRAST AND MRI NECK WITHOUT AND WITH CONTRAST  TECHNIQUE: Multiplanar, multiecho pulse sequences of the brain and surrounding structures were obtained without and with intravenous contrast. Angiographic images of the head were obtained using MRA technique without  and with contrast. Multiplanar, multiecho pulse sequences of the neck and surrounding structures were obtained without and with intravenous contrast.  CONTRAST:  20mL MULTIHANCE GADOBENATE DIMEGLUMINE 529 MG/ML IV SOLN  COMPARISON:  CT head 06/06/2013 and 05/06/2013.  FINDINGS: MRI HEAD FINDINGS  Acute infarct left PCA territory. There is acute infarction involving the left mid and posterior temporal lobe extending into the left occipital lobe. Acute infarct also present in the left thalamus and left midbrain. Acute infarct in the left inferior cerebellum. Acute infarct in the splenium of the corpus callosum. Tiny acute infarct right posterior inferior cerebellar artery territory.  Gradient echo images reveals a mild amount of hemorrhage in the left PICA infarct. There is also evidence of clot in the left posterior cerebral artery consistent with an embolus.  Postcontrast imaging reveals mild enhancement in the left PICA infarct. No underlying mass lesion is identified.  Ventricle size is normal. 7 mm midline shift toward the right. Scattered small white matter hyperintensities bilaterally consistent with chronic microvascular ischemic change.  MRA HEAD FINDINGS  Both vertebral arteries are patent to the basilar. Right vertebral artery is dominant.  Left posterior inferior cerebellar artery not identified and appears occluded. There is stenosis in the right posterior inferior cerebellar artery which could be embolus or atherosclerotic disease. The basilar is patent. AICA is patent bilaterally, left AICA is dominant. Bilateral superior cerebellar arteries are patent.  Irregularity in the right posterior cerebral artery consistent with either embolus or atherosclerotic disease. Occluded left posterior cerebral artery consistent with an embolus causing stroke.  Cavernous carotid is patent bilaterally. Anterior cerebral artery patent bilaterally. Right middle cerebral artery is widely patent.  Focal moderate to severe  stenosis in the proximal aspect of the temporal branch of the left middle cerebral artery.  Negative for cerebral aneurysm.  MRI NECK FINDINGS  Two vessel arch. Left common carotid artery over arises from the innominate artery. Proximal great vessels are patent.  Right carotid: Right common carotid artery widely patent. Atherosclerotic type plaque in the carotid bulb narrowing the lumen by approximately 25% diameter stenosis. Cervical carotid is patent.  Left carotid: Left common carotid artery widely patent. Left carotid bifurcation is widely patent and the left cervical carotid is patent.  Vertebral arteries: Right vertebral artery is dominant. There is a mild stenosis the distal right vertebral artery. There is irregularity in the mid left vertebral artery which could be due to dissection or atherosclerotic disease. Given the history of fall, and acute infarct, dissection is the most likely cause of this finding.  IMPRESSION: Acute infarct left  posterior cerebral artery territory involving the left inferior cerebellum, and left temporal and occipital lobe as well as the left thalamus. There appears to be in embolus the left posterior cerebral artery. Mild amount of hemorrhage in the left posterior inferior cerebellar artery infarct.  Occlusions of the left posterior cerebral artery and left posterior inferior cerebellar artery compatible with acute infarct and probable embolism.  Moderate to severe focal stenosis in the proximal aspect of the temporal branch of the left middle cerebral artery.  Segmental irregularity left vertebral artery most likely a cause for embolus causing acute infarction. Favor dissection given the recent history of a fall. Atherosclerotic disease also possible.  Critical Value/emergent results were called by telephone at the time of interpretation on 06/07/2013 at 2:00 PM to Dr.Charles Roseanne Reno , who verbally acknowledged these results.   Electronically Signed   By: Marlan Palau M.D.    On: 06/07/2013 14:03    Assessment/Plan: Diagnosis: left PCA infarct 1. Does the need for close, 24 hr/day medical supervision in concert with the patient's rehab needs make it unreasonable for this patient to be served in a less intensive setting? Yes 2. Co-Morbidities requiring supervision/potential complications: dm, post-stroke sequelae 3. Due to bladder management, bowel management, safety, skin/wound care, disease management, medication administration, pain management and patient education, does the patient require 24 hr/day rehab nursing? Yes 4. Does the patient require coordinated care of a physician, rehab nurse, PT (1-2 hrs/day, 5 days/week), OT (1-2 hrs/day, 5 days/week) and SLP (1-2 hrs/day, 5 days/week) to address physical and functional deficits in the context of the above medical diagnosis(es)? Yes Addressing deficits in the following areas: balance, endurance, locomotion, strength, transferring, bowel/bladder control, bathing, dressing, feeding, grooming, toileting, cognition, speech, language, swallowing and psychosocial support 5. Can the patient actively participate in an intensive therapy program of at least 3 hrs of therapy per day at least 5 days per week? Yes 6. The potential for patient to make measurable gains while on inpatient rehab is excellent 7. Anticipated functional outcomes upon discharge from inpatient rehab are supervision to min assist with PT, supervision to min assist with OT, supervision to min assist with SLP. 8. Estimated rehab length of stay to reach the above functional goals is: 20-24 days 9. Does the patient have adequate social supports to accommodate these discharge functional goals? Yes 10. Anticipated D/C setting: Home 11. Anticipated post D/C treatments: Outpt therapy 12. Overall Rehab/Functional Prognosis: excellent  RECOMMENDATIONS: This patient's condition is appropriate for continued rehabilitative care in the following setting: CIR Patient  has agreed to participate in recommended program. Yes Note that insurance prior authorization may be required for reimbursement for recommended care.  Comment: Pt has excellent social supports. Rehab RN to follow up.   Ranelle Oyster, MD, [place] [facility]     06/07/2013

## 2013-06-07 NOTE — Progress Notes (Signed)
Rehab Admissions Coordinator Note:  Patient was screened by Trish Mage for appropriateness for an Inpatient Acute Rehab Consult.  Noted PT and OT recommending CIR.  At this time, we are recommending Inpatient Rehab consult.  Trish Mage 06/07/2013, 2:49 PM  I can be reached at [phone].

## 2013-06-07 NOTE — Progress Notes (Signed)
OT Cancellation Note  Patient Details Name: Adam Callahan MRN: [id number] DOB: 30-Mar-1961   Cancelled Treatment:    Reason Eval/Treat Not Completed: Patient at procedure or test/ unavailable. Will continue to follow.  Evern Bio 06/07/2013, 12:59 PM

## 2013-06-07 NOTE — Evaluation (Signed)
Clinical/Bedside Swallow Evaluation Patient Details  Name: Adam Callahan MRN: [id number] Date of Birth: 20-Jun-1961  Today's Date: 06/07/2013 Time: 1340-1400 SLP Time Calculation (min): 20 min  Past Medical History:  Past Medical History  Diagnosis Date  . Diabetes mellitus    Past Surgical History: History reviewed. No pertinent past surgical history. 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  Found on the floor of his home of indeterminate time so also with rhabdomyolosis, ketosis.  Pt has DM at baseline and hx of CVA without residual deficits.    Assessment / Plan / Recommendation Clinical Impression  Pt presents with oral weakness and discoordination that results in anterior spillage as well as impaired mastication and bolus formation with solids, delayed A/P transit, and oral residuals of mechanical soft textures. Suspect a delayed swallow initiation, however hyolaryngeal movement appears adequate upon palpation. Pt was very impulsive, requiring Total A for small bites/sips. A wet vocal quality was noted after several large, consecutive cup and straw sips of thin liquids, however with SLP assistance for small bolus size, no overt s/s of aspiration were observed. Recommend to initiate a trial of Dys 1 textures to facilitate oral clearance and thin liquids with full supervision due to impulsivity.    Aspiration Risk  Mild    Diet Recommendation Dysphagia 1 (Puree);Thin liquid   Liquid Administration via: Cup;No straw Medication Administration: Crushed with puree Supervision: Patient able to self feed;Full supervision/cueing for compensatory strategies Compensations: Slow rate;Small sips/bites;Check for pocketing;Check for anterior loss;Follow solids with liquid Postural Changes and/or Swallow Maneuvers: Seated upright 90 degrees;Upright 30-60 min after meal    Other  Recommendations Oral Care Recommendations: Oral care  BID   Follow Up Recommendations  Inpatient Rehab    Frequency and Duration min 2x/week  2 weeks   Pertinent Vitals/Pain N/A    SLP Swallow Goals  Please refer to Care Plan for goals set 06/07/13.   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  Found on the floor of his home of indeterminate time so also with rhabdomyolosis, ketosis.  Pt has DM at baseline and hx of CVA without residual deficits.  Type of Study: Bedside swallow evaluation Previous Swallow Assessment: none in chart Diet Prior to this Study: NPO;IV Temperature Spikes Noted: Yes (low grade fever) Respiratory Status: Room air History of Recent Intubation: No Behavior/Cognition: Alert;Cooperative;Requires cueing Oral Cavity - Dentition: Missing dentition Self-Feeding Abilities: Able to feed self;Needs assist Patient Positioning: Upright in chair Baseline Vocal Quality: Hoarse (mildly hoarse, good vocal intensity) Volitional Cough: Weak Volitional Swallow: Able to elicit (adequate hyolaryngeal movement upon palpation)    Oral/Motor/Sensory Function Overall Oral Motor/Sensory Function: Impaired Labial ROM: Reduced right Labial Symmetry: Abnormal symmetry right Labial Strength: Reduced Labial Sensation: Reduced Lingual ROM: Reduced right Lingual Symmetry: Abnormal symmetry right Lingual Strength: Reduced Facial ROM: Reduced right Facial Symmetry: Right droop Facial Strength: Reduced Mandible: Within Functional Limits   Ice Chips Ice chips: Impaired Presentation: Spoon Pharyngeal Phase Impairments: Suspected delayed Swallow   Thin Liquid Thin Liquid: Impaired Presentation: Cup;Self Fed;Straw Oral Phase Impairments: Poor awareness of bolus Oral Phase Functional Implications: Right anterior  spillage Pharyngeal  Phase Impairments: Suspected delayed Swallow;Wet Vocal Quality Other Comments: pt impulsive with intake and demonstrated a wet vocal quality only after taking several  large consecutive sips at a time    Nectar Thick Nectar Thick Liquid: Not tested   Honey Thick Honey Thick Liquid: Not tested   Puree Puree: Impaired Presentation: Spoon;Self Fed Pharyngeal Phase Impairments: Suspected delayed Swallow   Solid   GO    Solid: Impaired Presentation: Self Fed Oral Phase Impairments: Reduced lingual movement/coordination;Impaired anterior to posterior transit;Poor awareness of bolus Oral Phase Functional Implications: Right anterior spillage;Oral residue Pharyngeal Phase Impairments: Suspected delayed Adam Callahan 06/07/2013,3:07 PM   Adam Callahan, M.A. CCC-SLP [phone]

## 2013-06-07 NOTE — Progress Notes (Signed)
Utilization Review Completed.  

## 2013-06-07 NOTE — Progress Notes (Signed)
Stroke Team Progress Note  HISTORY Adam Callahan is a 52 y.o. male with a history of diabetes as well as fall one month ago who presents with right-sided weakness that began Thursday evening. He is unable to speak, due to severe dysarthria and possibly aphasia, but does respond appropriately with head nods and shakes.   He was last seen after being dropped off and then was not heard from until today when his daughter found him on the floor. He was brought to the ER where a CT shows PCA and PICA distribution infarcts   LKW: Thursday evening, 11/13  tpa given?: no, outside of window.  NIHSS: 21   Patient was not a TPA candidate secondary to out of time frame. He was admitted for further evaluation and treatment.  SUBJECTIVE He is lying in bed, undergoing Echo. Aphasic.    OBJECTIVE Most recent Vital Signs: Filed Vitals:   06/07/13 0010 06/07/13 0200 06/07/13 0400 06/07/13 0455  BP: 142/71 145/71 121/53 140/70  Pulse: 77 78  79  Temp: 99.2 F (37.3 C)   98 F (36.7 C)  TempSrc: Axillary   Axillary  Resp: 18 19 18 16   Height:      Weight:      SpO2: 95% 99%  99%   CBG (last 3)   Recent Labs  06/06/13 1806 06/07/13 0009 06/07/13 0456  GLUCAP 325* 224* 209*    IV Fluid Intake:   . sodium chloride 125 mL/hr at 06/06/13 2252    MEDICATIONS  . aspirin  300 mg Rectal Daily   Or  . aspirin  325 mg Oral Daily  . enoxaparin (LOVENOX) injection  40 mg Subcutaneous Daily  . insulin aspart  0-9 Units Subcutaneous TID WC   PRN:    Diet:  NPO  Activity:  OOB with assist DVT Prophylaxis:  lovenox  CLINICALLY SIGNIFICANT STUDIES Basic Metabolic Panel:  Recent Labs Lab 06/06/13 1806 06/06/13 1837 06/07/13 0525  NA 137 140 141  K 3.6 3.3* 3.3*  CL 97 101 103  CO2 27  --  27  GLUCOSE 265* 272* 187*  BUN 20 21 17   CREATININE 0.69 1.00 0.83  CALCIUM 9.2  --  8.7   Liver Function Tests:  Recent Labs Lab 06/06/13 1806 06/07/13 0525  AST 26 21  ALT 22 18   ALKPHOS 71 63  BILITOT 0.8 0.7  PROT 8.1 7.4  ALBUMIN 3.7 3.2*   CBC:  Recent Labs Lab 06/06/13 1806 06/06/13 1837 06/07/13 0525  WBC 11.5*  --  9.4  NEUTROABS 8.3*  --  6.0  HGB 15.9 16.0 13.4  HCT 43.8 47.0 40.2  MCV 84.2  --  84.3  PLT 301  --  293   Coagulation:  Recent Labs Lab 06/06/13 1806  LABPROT 13.4  INR 1.04   Cardiac Enzymes:  Recent Labs Lab 06/06/13 1806 06/06/13 2252 06/07/13 0525  CKTOTAL 1086*  --  754*  TROPONINI <0.30 <0.30  --    Urinalysis:  Recent Labs Lab 06/06/13 1831  COLORURINE YELLOW  LABSPEC 1.037*  PHURINE 5.0  GLUCOSEU >1000*  HGBUR MODERATE*  BILIRUBINUR SMALL*  KETONESUR >80*  PROTEINUR 100*  UROBILINOGEN 1.0  NITRITE NEGATIVE  LEUKOCYTESUR NEGATIVE   Lipid Panel    Component Value Date/Time   CHOL 204* 06/07/2013 0525   TRIG 92 06/07/2013 0525   HDL 42 06/07/2013 0525   CHOLHDL 4.9 06/07/2013 0525   VLDL 18 06/07/2013 0525   LDLCALC 144* 06/07/2013 0525  HgbA1C  No results found for this basename: HGBA1C    Urine Drug Screen:     Component Value Date/Time   LABOPIA NONE DETECTED 06/06/2013 1831   COCAINSCRNUR NONE DETECTED 06/06/2013 1831   LABBENZ NONE DETECTED 06/06/2013 1831   AMPHETMU NONE DETECTED 06/06/2013 1831   THCU NONE DETECTED 06/06/2013 1831   LABBARB NONE DETECTED 06/06/2013 1831    Alcohol Level:  Recent Labs Lab 06/06/13 1806  ETH <11    Ct Head Wo Contrast 06/06/2013  Multiple subacute nonhemorrhagic infarctions including the left cerebellum, left occipital lobe, left temporal lobe, and left thalamus and left cerebral peduncle.  Slight midline shift and mass effect.  No hemorrhage.     MRI of the brain    MRA of the brain    2D Echocardiogram    Carotid Doppler    CXR    EKG  normal sinus rhythm.   Therapy Recommendations   Physical Exam   Patient is awake, alert, unable to answer orientation questions due to severe dysarthria.  Immediate and remote memory are  intact.  Patient is able to give a clear and coherent history.  No signs of Neglect. I do suspect some expressive aphasia, but is able to reliably and quickly follow all commands. This is difficult due to severe dysarthria.  Cranial Nerves:  II: No blink from right, intact on left. Suspect right hemianopia Pupils are equal, round, and reactive to light. Discs are difficult to visualize.  III,IV, VI: Leftward gaze, unable to cross midline to the right.  V: Facial sensation is decreased on left  VII: Facial movement is decreased on right  VIII: hearing is intact to voice  X: Poor palatal elevation  XI: Shoulder shrug is decreased on right  XII: tongue deviates to the right  Motor:  Tone is normal. Bulk is normal. 5/5 strength was present on the left, no movement on the right. Dense right hemiplegia Sensory:  Sensation is diminished on the right, but does grimace to nox stim  Deep Tendon Reflexes:  2+ and symmetric in the biceps and patellae.  Cerebellar:  FNF intact on left  Gait:  Not tested due to profound weakness  ASSESSMENT [name] is a 52 y.o. male presenting with right hemiparesis. Patient had a fall 1 month ago. CT Imaging confirms a left cerebellum, left occipital lobe, left temporal lobe, and left thalamus and left cerebral peduncle non-hemorrhagic infarcts. Infarct felt to be embolic secondary to unknown source. MRI/MRA images head/neck pending to access for dissection. On no antithrombotics prior to admission. Now on rectal aspirin 300mg  daily for secondary stroke prevention. Patient with resultant global aphasia. Work up underway.   Hyperlipidemia, LDL 144, goal < 70, add statin once able to take NPO and once patient has recovered from Rhabdo  Diabetes mellitus, type 2, HGB A1C pending.  Rhabdo  Former smoker  Hospital day # 1  TREATMENT/PLAN  Continue rectal aspirin for secondary stroke prevention.  Echo/carotid/HGB A1C pending  MRI/A head and neck  pending to access for dissection  Risk factor modification  Await therapy evaluation.  Gwendolyn Lima. Manson Passey, [facility], MBA, MHA [facility] Pager: [phone] 06/07/2013 7:49 AM  I have personally obtained a history, examined the patient, evaluated imaging results, and formulated the assessment and plan of care. I agree with the above. Kenton Kingfisher, MD

## 2013-06-07 NOTE — Evaluation (Signed)
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  Found on the floor of his home of indeterminate time so also with rhabdomyolosis, ketosis.  Pt has DM at baseline and hx of CVA without residual deficits.   Clinical Impression   Pt presents with dependence in all ADL and in mobility due to R hemiparesis, vision deficits, aphasia, and impaired attention with impulsivity.  He requires +2 assist for OOB activity.  Will follow.  Pt is an excellent rehab candidate.    OT Assessment  Patient needs continued OT Services    Follow Up Recommendations  CIR    Barriers to Discharge      Equipment Recommendations  3 in 1 bedside comode    Recommendations for Other Services Rehab consult  Frequency  Min 3X/week    Precautions / Restrictions Precautions Precautions: Fall Restrictions Weight Bearing Restrictions: No   Pertinent Vitals/Pain No pain, VSS    ADL  Eating/Feeding: NPO Where Assessed - Eating/Feeding: Chair Grooming: Teeth care;Minimal assistance (toothbrush with water) Upper Body Bathing: Maximal assistance Where Assessed - Upper Body Bathing: Supported sitting Lower Body Bathing: +1 Total assistance Where Assessed - Lower Body Bathing: Supported sitting;Supported sit to stand Upper Body Dressing: Maximal assistance Where Assessed - Upper Body Dressing: Supported sitting Lower Body Dressing: +1 Total assistance Where Assessed - Lower Body Dressing: Supine, head of bed up Toilet Transfer: +2 Total assistance Toilet Transfer: Patient Percentage: 50% Toilet Transfer Method: Sit to stand Toileting - Architect and Hygiene: +2 Total assistance Toileting - Clothing Manipulation  and Hygiene: Patient Percentage: 0% Where Assessed - Glass blower/designer Manipulation and Hygiene: Standing Equipment Used: Gait belt Transfers/Ambulation Related to ADLs: +2 total assist pt 50% to transfer toward L side, non ambulatory    OT Diagnosis: Generalized weakness;Cognitive deficits;Disturbance of vision;Hemiplegia dominant side  OT Problem List: Decreased strength;Decreased activity tolerance;Impaired balance (sitting and/or standing);Impaired vision/perception;Decreased coordination;Decreased cognition;Decreased safety awareness;Decreased knowledge of use of DME or AE;Impaired sensation;Impaired tone;Impaired UE functional use OT Treatment Interventions: Self-care/ADL training;Therapeutic activities;Cognitive remediation/compensation;Visual/perceptual remediation/compensation;Patient/family education;Balance training;DME and/or AE instruction;Neuromuscular education   OT Goals(Current goals can be found in the care plan section) Acute Rehab OT Goals Patient Stated Goal: to go home OT Goal Formulation: With patient Time For Goal Achievement: 06/21/13 Potential to Achieve Goals: Good ADL Goals Pt Will Perform Grooming: with supervision;with set-up;sitting Pt Will Perform Upper Body Bathing: with mod assist;sitting Pt Will Perform Upper Body Dressing: with mod assist;sitting Pt Will Transfer to Toilet: with min assist;bedside commode Additional ADL Goal #1: Pt and family will position R UE to prevent injury in chair, bed and during mobility with min guard assist.. Additional ADL Goal #2: Pt will locate ADL items to the right of midline with minimal verbal and auditory cues. Additional ADL Goal #3: Pt will sit EOB with supervision while engaged in ADL requiring reaching x10 minutes.  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  Found on the floor of his home of indeterminate time so also with rhabdomyolosis, ketosis.  Pt has DM at baseline and hx of CVA without  residual deficits.       Prior Functioning     Home Living Family/patient expects to be discharged to:: Private residence (Aunt's home) Living Arrangements: Alone Available Help at Discharge: Family;Available 24 hours/day Type of Home: House Home Access: Level entry Home Layout: One level (Aunt has converted garage into a room.) Home Equipment: None Prior Function Level of Independence: Independent Comments: Driving, pastor Communication Communication: Expressive difficulties Dominant Hand: Right         Vision/Perception Vision - Assessment Eye Alignment: Impaired (comment) Vision Assessment: Vision tested Alignment/Gaze Preference: Gaze left Tracking/Visual Pursuits: Left eye does not track laterally;Unable to hold eye position out of midline (unable to separate eye from head movements, turns head to R)   Cognition  Cognition Arousal/Alertness: Awake/alert Behavior During Therapy: Impulsive (labile) Overall Cognitive Status: Impaired/Different from baseline Area of Impairment: Attention;Following commands;Safety/judgement;Awareness;Problem solving Current Attention Level: Focused Following Commands: Follows one step commands with increased time (Questionable neglect of right side ) Safety/Judgement: Decreased awareness of safety;Decreased awareness of deficits Problem Solving: Slow processing;Difficulty sequencing;Requires verbal cues;Requires tactile cues General Comments: All of above difficult to assess secondary to language difficulties.  Emotional lability at end of session.      Extremity/Trunk Assessment Upper Extremity Assessment Upper Extremity Assessment: RUE deficits/detail RUE Deficits / Details: Brunnstrom level 1 RUE  Sensation: decreased light touch;decreased proprioception RUE Coordination: decreased fine motor;decreased gross motor Lower Extremity Assessment Lower Extremity Assessment: Defer to PT evaluation RLE Deficits / Details: Brunstrom III RLE Sensation: decreased light touch RLE Coordination: decreased gross motor Cervical / Trunk Assessment Cervical / Trunk Assessment: Normal     Mobility Bed Mobility Bed Mobility: Rolling Left;Left Sidelying to Sit;Sitting - Scoot to [place] of Bed Rolling Left: 4: Min assist;With rail Left Sidelying to Sit: 4: Min assist;With rails;HOB elevated Sitting - Scoot to [place] of Bed: 4: Min assist Details for Bed Mobility Assistance: Assist for sequencing and technique with pt moving impulsively to get to EOB.  Soaked with urine therefore assisted nursing with changing gown and cleaning urine off of skin.   Transfers Sit to Stand: 1: +2 Total assist;With upper extremity assist;From bed Sit to Stand: Patient Percentage: 50% Stand to Sit: 1: +2 Total assist;With upper extremity assist;To chair/3-in-1;With armrests Stand to Sit: Patient Percentage: 50% Details for Transfer Assistance: Pt needed assist for sit to stand using power up from left hemibody.  Pt able to weight bear on right LE but needed support of right knee to maintain standing.  Pt pivoted to chair on left LE mostly with assist to weight shift.  Needed cues to posture as pt was flexed most of time.       Exercise     Balance Balance Balance Assessed: Yes Static Sitting Balance Static Sitting - Balance Support: Left upper extremity supported;Feet supported Static Sitting - Level of Assistance: 4: Min assist Static Sitting - Comment/# of Minutes: Sat EOB to wash back and change gown for 3 min with min assist with left lean.   Static Standing Balance Static Standing - Balance Support: No upper extremity supported;During functional activity Static Standing - Level of Assistance: 3: Mod assist Static  Standing - Comment/# of Minutes: 2 min to be cleaned with 2 person assist with flexed posture and blocking right knee.  Once in chair, positioned with pillow on left side to try and improve sitting to midline.  See OT note regarding issues with midline.     End of Session OT - End of Session Patient left:  in chair;with call bell/phone within reach;with family/visitor present;with nursing/sitter in room Nurse Communication: Mobility status  GO     Evern Bio 06/07/2013, 2:25 PM [phone]

## 2013-06-07 NOTE — Progress Notes (Signed)
[facility] Progress Note [place] TEAM 1 - Stepdown ICU Team   Adam Callahan ZOX:[id number] DOB: 05-02-1961 DOA: 06/06/2013 PCP: No PCP Per Patient  Brief narrative: 52 y.o. male with history of diabetes mellitus was brought to the ER after patient's family found him on the floor at his house. Patient was last seen normal 3 days prior to arrival. EMS was called after the patient was found down. He was found with symptoms c/w expressive aphasia and right-sided hemiplegia. CT of the head done in the ER showed multiple subacute nonhemorrhagic infarcts mild midline shift and mass effect. Neurologist on-call Dr. Amada Jupiter evaluated the patient. Admitting MD noted on initial exam he was able to follow commands but was not able to not give much history due to aphasia. Per the family he had a fall last month.  Assessment/Plan:    CVA (cerebral infarction) -Neuro following with full work up in process -cont PT/OT/SLP -MRA reveals left vertebral artery dissection (family says pt fell off ladder ~ 1 month ago) with associated embolus -Neuro adding Plavix  -per SLP recs start D1 diet with thin liquids -CIR eval    Dehydration -cont IVF and follow lytes    Diabetes mellitus type 2, uncontrolled -was not on meds at home  -cont SSI for now -will add low dose Lantus for now -FU on HgbA1c -per 2011 ED note pt was on Metformin    Rhabdomyolysis - mild  -CPK trending down with hydration -cont IVF     Hypokalemia -IV replete    Severe sleep apnea -noted on prior PSG from 2006 -recommendations were for nocturnal CPAP 25 CWP    Dyslipidemia -start statin once allowed diet   DVT prophylaxis: SCDs Code Status: Full Family Communication: Patient and 4 family members at the bedside Disposition Plan/Expected LOS: Remain in step down  Consultants: Neurology  Procedures: Carotid Dopplers pending  2-D echocardiogram  - Left ventricle: The cavity size was normal. There was  mild concentric hypertrophy. Systolic function was normal. The estimated ejection fraction was in the range of 55% to 60%. Although no diagnostic regional wall motion abnormality was identified, this possibility cannot be completely excluded on the basis of this study. Doppler parameters are consistent with abnormal left ventricular relaxation (grade 1 diastolic dysfunction). - Pulmonary arteries: Systolic pressure was mildly increased. PA peak pressure: 34mm Hg (S). Impressions:  - No cardiac source of emboli was indentified  MRI/MRA head   Antibiotics: None  HPI/Subjective: Patient alert. Speech limited due to dysarthria and expressive aphasia but appears to nod and understand during interaction with physician. No apparent complaints.  Objective: Blood pressure 142/77, pulse 87, temperature 97.6 F (36.4 C), temperature source Axillary, resp. rate 17, height 5\' 5"  (1.651 m), weight 170 lb 13.7 oz (77.5 kg), SpO2 97.00%.  Intake/Output Summary (Last 24 hours) at 06/07/13 1253 Last data filed at 06/07/13 1000  Gross per 24 hour  Intake   1500 ml  Output      0 ml  Net   1500 ml   Exam: General: No acute respiratory distress Lungs: Clear to auscultation bilaterally without wheezes or crackles, RA Cardiovascular: Regular rate and rhythm without murmur gallop or rub normal S1 and S2, no peripheral edema or JVD Abdomen: Nontender, nondistended, soft, bowel sounds positive, no rebound, no ascites, no appreciable mass Musculoskeletal: No significant cyanosis, clubbing of bilateral lower extremities Neurological: Alert and oriented to at least name noting exam severely limited by expressive aphasia and dysarthria, noted with dense  right hemiplegia and possible right-sided neglect, no ptosis   Scheduled Meds:  Scheduled Meds: . aspirin  300 mg Rectal Daily   Or  . aspirin  325 mg Oral Daily  . enoxaparin (LOVENOX) injection  40 mg Subcutaneous Daily  . insulin aspart  0-9 Units  Subcutaneous TID WC    Data Reviewed: Basic Metabolic Panel:  Recent Labs Lab 06/06/13 1806 06/06/13 1837 06/07/13 0525  NA 137 140 141  K 3.6 3.3* 3.3*  CL 97 101 103  CO2 27  --  27  GLUCOSE 265* 272* 187*  BUN 20 21 17   CREATININE 0.69 1.00 0.83  CALCIUM 9.2  --  8.7   Liver Function Tests:  Recent Labs Lab 06/06/13 1806 06/07/13 0525  AST 26 21  ALT 22 18  ALKPHOS 71 63  BILITOT 0.8 0.7  PROT 8.1 7.4  ALBUMIN 3.7 3.2*   CBC:  Recent Labs Lab 06/06/13 1806 06/06/13 1837 06/07/13 0525  WBC 11.5*  --  9.4  NEUTROABS 8.3*  --  6.0  HGB 15.9 16.0 13.4  HCT 43.8 47.0 40.2  MCV 84.2  --  84.3  PLT 301  --  293   Cardiac Enzymes:  Recent Labs Lab 06/06/13 1806 06/06/13 2252 06/07/13 0525  CKTOTAL 1086*  --  754*  TROPONINI <0.30 <0.30  --    BNP (last 3 results) No results found for this basename: PROBNP,  in the last 8760 hours CBG:  Recent Labs Lab 06/06/13 1806 06/07/13 0009 06/07/13 0456  GLUCAP 325* 224* 209*    Recent Results (from the past 240 hour(s))  MRSA PCR SCREENING     Status: None   Collection Time    06/06/13 10:29 PM      Result Value Range Status   MRSA by PCR NEGATIVE  NEGATIVE Final   Comment:            The GeneXpert MRSA Assay (FDA     approved for NASAL specimens     only), is one component of a     comprehensive MRSA colonization     surveillance program. It is not     intended to diagnose MRSA     infection nor to guide or     monitor treatment for     MRSA infections.     Studies:  Recent x-ray studies have been reviewed in detail by the Attending Physician     Junious Silk, ANP [facility] Office  [phone] Pager [phone]  **If unable to reach the above provider after paging please contact the Flow Manager @ [phone]  On-Call/Text Page:      [email or web address]      password [email or web address]  If 7PM-7AM, please contact night-coverage [email or web address] Password [email or web address] 06/07/2013, 12:53 PM   LOS: 1  day   I have personally examined this patient and reviewed the entire database. I have reviewed the above note, made any necessary editorial changes, and agree with its content.  Lonia Blood, MD [facility]

## 2013-06-08 DIAGNOSIS — E785 Hyperlipidemia, unspecified: Secondary | ICD-10-CM

## 2013-06-08 DIAGNOSIS — I6330 Cerebral infarction due to thrombosis of unspecified cerebral artery: Secondary | ICD-10-CM

## 2013-06-08 LAB — GLUCOSE, CAPILLARY
Glucose-Capillary: 135 mg/dL — ABNORMAL HIGH (ref 70–99)
Glucose-Capillary: 221 mg/dL — ABNORMAL HIGH (ref 70–99)

## 2013-06-08 LAB — CBC
HCT: 35.4 % — ABNORMAL LOW (ref 39.0–52.0)
Hemoglobin: 12.4 g/dL — ABNORMAL LOW (ref 13.0–17.0)
MCHC: 35 g/dL (ref 30.0–36.0)
MCV: 83.9 fL (ref 78.0–100.0)
RDW: 12.2 % (ref 11.5–15.5)

## 2013-06-08 LAB — BASIC METABOLIC PANEL
BUN: 12 mg/dL (ref 6–23)
Chloride: 102 mEq/L (ref 96–112)
GFR calc non Af Amer: >90 mL/min (ref >90)
Glucose, Bld: 134 mg/dL — ABNORMAL HIGH (ref 70–99)
Potassium: 3.5 mEq/L (ref 3.5–5.1)

## 2013-06-08 MED ORDER — POTASSIUM CHLORIDE 20 MEQ/15ML (10%) PO LIQD: 40.0000 meq | Freq: Once | ORAL | Status: DC

## 2013-06-08 MED ORDER — ASPIRIN EC 81 MG PO TBEC
81.0000 mg | DELAYED_RELEASE_TABLET | Freq: Every day | ORAL | Status: DC
  Administered 2013-06-08: 81 mg via ORAL
  Filled 2013-06-08 (×2): qty 1

## 2013-06-08 MED ORDER — ATORVASTATIN CALCIUM 20 MG PO TABS
20.0000 mg | ORAL_TABLET | Freq: Every day | ORAL | Status: DC
  Administered 2013-06-08: 20 mg via ORAL
  Filled 2013-06-08 (×3): qty 1

## 2013-06-08 MED ORDER — ACETAMINOPHEN 500 MG PO TABS
1000.0000 mg | ORAL_TABLET | Freq: Four times a day (QID) | ORAL | Status: DC | PRN
  Administered 2013-06-08 – 2013-06-09 (×2): 1000 mg via ORAL
  Filled 2013-06-08 (×2): qty 2

## 2013-06-08 NOTE — Progress Notes (Signed)
Patient complains of headache, nothing ordered.  Temp 100.4.  BP 164/79.  Paged MD for orders. Dani Gobble, RN

## 2013-06-08 NOTE — Progress Notes (Signed)
Occupational Therapy Treatment Patient Details Name: Adam Callahan MRN: [id number] DOB: May 06, 1961 Today's Date: 06/08/2013 Time: 4098-1191 OT Time Calculation (min): 21 min  OT Assessment / Plan / Recommendation  History of present illness Pt admit with multiple infarcts of L cerebellum, occipital lobe, thalamus, cerebral peduncle.  Found on the floor of his home of indeterminate time so also with rhabdomyolosis, ketosis.  Pt has DM at baseline and hx of CVA without residual deficits.   OT comments  Pt fatigued this pm - OT saw him just after getting back into bed and after procedure.  Pt continues with Lt gaze preference and Rt. Neglect/inattention.  No movement noted Rt. UE despite facilitation.  Recommend CIR  Follow Up Recommendations  CIR    Barriers to Discharge       Equipment Recommendations  3 in 1 bedside comode    Recommendations for Other Services Rehab consult  Frequency Min 3X/week   Progress towards OT Goals Progress towards OT goals: Not progressing toward goals - comment (fatigued today)  Plan Discharge plan remains appropriate    Precautions / Restrictions Precautions Precautions: Fall Restrictions Weight Bearing Restrictions: No   Pertinent Vitals/Pain     ADL  Upper Body Bathing: Moderate assistance Where Assessed - Upper Body Bathing: Supine, head of bed up ADL Comments: Pt just back to bed and fatigued.  Pt continues with Lt. gaze preference.  when he attempts to locate items to the Rt, he turns his head to the Rt, with eyes deviating to the lt.  Eyes will not cross midline despite max auditory and visual stimuli.  Pt will locate Rt. UE with min verbal cues, but demonstrates no active movement despite max facilitation and attempts at bil. tasks - Rt. UE appears flaccid, but difficult to determine if it is truly flaccid, or if pt neglecting Rt. UE.  Pt applied lotion to bil. hand and UE with mod A.  Pt very fatigued and falling asleep so session  terminated    OT Diagnosis:    OT Problem List:   OT Treatment Interventions:     OT Goals(current goals can now be found in the care plan section) ADL Goals Pt Will Perform Grooming: with supervision;with set-up;sitting Pt Will Perform Upper Body Bathing: with mod assist;sitting Pt Will Perform Upper Body Dressing: with mod assist;sitting Pt Will Transfer to Toilet: with min assist;bedside commode Additional ADL Goal #1: Pt and family will position R UE to prevent injury in chair, bed and during mobility with min guard assist.. Additional ADL Goal #2: Pt will locate ADL items to the right of midline with minimal verbal and auditory cues. Additional ADL Goal #3: Pt will sit EOB with supervision while engaged in ADL requiring reaching x10 minutes.  Visit Information  Last OT Received On: 06/08/13 Assistance Needed: +2 History of Present Illness: Pt admit with multiple infarcts of L cerebellum, occipital lobe, thalamus, cerebral peduncle.  Found on the floor of his home of indeterminate time so also with rhabdomyolosis, ketosis.  Pt has DM at baseline and hx of CVA without residual deficits.    Subjective Data      Prior Functioning       Cognition  Cognition Arousal/Alertness: Lethargic Behavior During Therapy: Flat affect Overall Cognitive Status: Impaired/Different from baseline Area of Impairment: Attention;Following commands;Awareness;Problem solving Current Attention Level: Sustained Following Commands: Follows one step commands with increased time Safety/Judgement: Decreased awareness of safety Problem Solving: Slow processing;Difficulty sequencing;Requires verbal cues;Requires tactile cues General Comments: difficult to  accurately asses due to pt with limited verbalization today    Mobility  Bed Mobility Bed Mobility: Not assessed Transfers Transfers: Not assessed Sit to Stand: 1: +2 Total assist;With upper extremity assist;From bed Sit to Stand: Patient Percentage:  50% Stand to Sit: 1: +2 Total assist;With upper extremity assist;To chair/3-in-1;With armrests Stand to Sit: Patient Percentage: 50% Details for Transfer Assistance: Pt needed assist for sit to stand using power up from left hemibody.  Pt able to weight bear on right LE but needed support of right knee to maintain standing.  Needed cues to posture as pt tends to flex unless constantly cued. Worked on weight shifting bil directions with attempts to facilitate hip extension and knee extension for right LE control with trace activiation with facilitation and support.  Pt needed constant cueing to extend trunk as well.  Kept flexing his posture and needed facilitation and cuing for trunk control as well.  Tried to take a step forward with right LE but pt too weak.  With extensive assist and support pt could step left LE forward and back.      Exercises  General Exercises - Upper Extremity Shoulder Flexion: PROM;5 reps;Right;Supine Shoulder Extension: PROM;Right;5 reps;Supine Elbow Flexion: PROM;Right;5 reps;Supine Elbow Extension: PROM;Right;5 reps;Supine Wrist Flexion: PROM;Right;5 reps;Supine Wrist Extension: PROM;Right;5 reps;Supine Digit Composite Flexion: PROM;Right;5 reps;Supine Composite Extension: PROM;Right;5 reps;Supine   Balance Static Sitting Balance Static Sitting - Level of Assistance: Not tested (comment) Static Standing Balance Static Standing - Balance Support: Bilateral upper extremity supported;During functional activity Static Standing - Level of Assistance: 1: +2 Total assist;Patient percentage (comment) (pt =50%) Static Standing - Comment/# of Minutes: Stands statically for up to 2 min with 2 person assist as above. Dynamic Standing Balance Dynamic Standing - Balance Support: Bilateral upper extremity supported;During functional activity Dynamic Standing - Level of Assistance: 1: +2 Total assist;Patient percentage (comment) (pt =50%) Dynamic Standing - Balance Activities:  Lateral lean/weight shifting;Forward lean/weight shifting Dynamic Standing - Comments: Needs 2 person assist for pre gait activities needing a lot of assist and cues as well as facilitation.   End of Session OT - End of Session Activity Tolerance: Patient limited by fatigue Patient left: in bed;with call bell/phone within reach;with family/visitor present  GO     Miroslava Santellan, Ursula Alert M 06/08/2013, 3:07 PM

## 2013-06-08 NOTE — Progress Notes (Addendum)
I have begun precertification with [employer] for admission to inpt rehab. I will contact family to discuss rehab. [phone]

## 2013-06-08 NOTE — PMR Pre-admission (Signed)
PMR Admission Coordinator Pre-Admission Assessment  Patient: Adam Callahan is an 52 y.o., male MRN: [id number] DOB: Aug 21, 1960 Height: 5\' 5"  (165.1 cm) Weight: 77.5 kg (170 lb 13.7 oz)              Insurance Information HMO:     PPO: yes     PCP:      IPA:      80/20:      OTHER:  PRIMARY: [place] [employer] [place]      Policy#: [id number]      Subscriber: pt CM Name: Elnita Maxwell      Phone#: [phone]     Fax#: [phone] Pre-Cert#: [id number]     Employer: [employer] Benefits:  Phone #: [phone]     Name: 11/18 Eff. Date: 07/22/12     Deduct: $933      Out of Pocket Max: $3793      Life Max: none CIR: $291 copay per admit then 70% after deductible      SNF: 70% 100 days Outpatient: 10%     Co-Pay: no visit limit Home Health: 70%      Co-Pay: no visit limit DME: 70%     Co-Pay: 30% Providers: in network  SECONDARY: none        Medicaid Application Date:       Case Manager:  Disability Application Date:       Case Worker:  Patient had been out of work for one month pta due to fall from ladder that injured his back  Emergency Contact Information Contact Information   Name Relation Home Work Mobile   [place] [phone]  [phone]     Current Medical History  Patient Admitting Diagnosis: left PCA infarct  History of Present Illness: Adam Callahan is a 52 y.o. right-handed male with history of diabetes mellitus with peripheral neuropathy.  Admitted 06/06/2013 with acute right-sided weakness and aphasia after being found on the floor by his family. MRI of the brain acute infarct left PCA territory. There is acute infarct involving the left mid and posterior temporal lobe extending into the left occipital lobe as well acute infarct present left thalamus and left midbrain, left inferior cerebellum. MRA of the head negative for aneurysm. MRI of the neck with occlusions of the left posterior cerebral artery and left posterior inferior cerebellar artery compatible  with acute infarct. Echocardiogram with ejection fraction of 55% no wall motion abnormalities and grade 1 diastolic dysfunction. Patient did not receive TPA. Neurology services consulted presently on aspirin and Plavix for CVA prophylaxis as well as subcutaneous Lovenox for DVT prophylaxis. Presently maintained on a dysphagia 1 thin liquid diet. Hemoglobin A1c 10.9 with insulin therapy as directed.   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    Total: 22 NIH    Past Medical History  Past Medical History  Diagnosis Date  . Diabetes mellitus     Family History  family history includes Cervical cancer in his mother; Diabetes Mellitus II in his maternal uncle; Hypertension in his sister.  Prior Rehab/Hospitalizations: none  Current Medications  Current facility-administered medications:0.9 % NaCl with KCl 20 mEq/ L  infusion, , Intravenous, Continuous, Russella Dar, NP, Last Rate: 50 mL/hr at 06/08/13 1553;  acetaminophen (TYLENOL) tablet 1,000 mg, 1,000 mg, Oral, Q6H PRN, Calvert Cantor, MD;  aspirin EC tablet 81 mg, 81 mg, Oral, Daily, Cathlyn Parsons, PA-C, 81 mg at 06/08/13 1018;  atorvastatin (LIPITOR) tablet 20 mg, 20 mg,  Oral, q1800, Cathlyn Parsons, PA-C, 20 mg at 06/08/13 1816 clopidogrel (PLAVIX) tablet 75 mg, 75 mg, Oral, Q breakfast, Cathlyn Parsons, PA-C, 75 mg at 06/08/13 1610;  enoxaparin (LOVENOX) injection 40 mg, 40 mg, Subcutaneous, Daily, Eduard Clos, MD, 40 mg at 06/08/13 9604;  insulin aspart (novoLOG) injection 0-9 Units, 0-9 Units, Subcutaneous, TID WC, Eduard Clos, MD, 3 Units at 06/08/13 1743 insulin glargine (LANTUS) injection 5 Units, 5 Units, Subcutaneous, Daily, Russella Dar, NP, 5 Units at 06/08/13 0935;  potassium chloride 20 MEQ/15ML (10%) liquid 40 mEq, 40 mEq, Oral, Once, Russella Dar, NP  Patients Current Diet: Dysphagia 1 diet with thin liquids  Precautions / Restrictions Precautions Precautions:  Fall Restrictions Weight Bearing Restrictions: No   Prior Activity Level Community (5-7x/wk): active pta. out of work for 1 month due to fall from ladder at work; Education officer, environmental also  Journalist, newspaper / Equipment Home Assistive Devices/Equipment: None Home Equipment: None  Prior Functional Level Prior Function Level of Independence: Independent Comments: worked as Copy at [employer]; also Scientist, clinical (histocompatibility and immunogenetics)  Overall Cognitive Status: Impaired/Different from baseline Current Attention Level: Sustained Orientation Level: Oriented X4 Following Commands: Follows one step commands with increased time Safety/Judgement: Decreased awareness of safety General Comments: difficult to accurately asses due to pt with limited verbalization today Attention: Focused Focused Attention: Impaired Focused Attention Impairment: Functional basic Memory: Impaired Memory Impairment: Decreased recall of new information Awareness: Impaired Awareness Impairment: Intellectual impairment Problem Solving: Impaired Problem Solving Impairment: Functional basic;Verbal basic Safety/Judgment: Impaired    Extremity Assessment (includes Sensation/Coordination)          ADLs  Eating/Feeding: NPO Where Assessed - Eating/Feeding: Chair Grooming: Teeth care;Minimal assistance (toothbrush with water) Upper Body Bathing: Moderate assistance Where Assessed - Upper Body Bathing: Supine, head of bed up Lower Body Bathing: +1 Total assistance Where Assessed - Lower Body Bathing: Supported sitting;Supported sit to stand Upper Body Dressing: Maximal assistance Where Assessed - Upper Body Dressing: Supported sitting Lower Body Dressing: +1 Total assistance Where Assessed - Lower Body Dressing: Supine, head of bed up Toilet Transfer: +2 Total assistance Toilet Transfer: Patient Percentage: 50% Toilet Transfer Method: Sit to stand Toileting - Clothing Manipulation and Hygiene:  +2 Total assistance Toileting - Clothing Manipulation and Hygiene: Patient Percentage: 0% Where Assessed - Glass blower/designer Manipulation and Hygiene: Standing Equipment Used: Gait belt Transfers/Ambulation Related to ADLs: +2 total assist pt 50% to transfer toward L side, non ambulatory ADL Comments: Pt just back to bed and fatigued.  Pt continues with Lt. gaze preference.  when he attempts to locate items to the Rt, he turns his head to the Rt, with eyes deviating to the lt.  Eyes will not cross midline despite max auditory and visual stimuli.  Pt will locate Rt. UE with min verbal cues, but demonstrates no active movement despite max facilitation and attempts at bil. tasks - Rt. UE appears flaccid, but difficult to determine if it is truly flaccid, or if pt neglecting Rt. UE.  Pt applied lotion to bil. hand and UE with mod A.  Pt very fatigued and falling asleep so session terminated    Mobility  Bed Mobility: Not assessed Rolling Left: 4: Min assist;With rail Left Sidelying to Sit: 4: Min assist;With rails;HOB elevated Sitting - Scoot to Edge of Bed: 4: Min assist    Transfers  Transfers: Sit to Stand;Stand to Sit Sit to Stand: 1: +2 Total assist;With upper extremity assist;From bed Sit  to Stand: Patient Percentage: 50% Stand to Sit: 1: +2 Total assist;With upper extremity assist;To chair/3-in-1;With armrests Stand to Sit: Patient Percentage: 50% Stand Pivot Transfers: Not tested (comment) Stand Pivot Transfers: Patient Percentage: 50%    Ambulation / Gait / Stairs / Wheelchair Mobility  Ambulation/Gait Ambulation/Gait Assistance: Not tested (comment) Stairs: No Wheelchair Mobility Wheelchair Mobility: No    Posture / Higher education careers adviser Sitting Balance Static Sitting - Balance Support: Left upper extremity supported;Feet supported Static Sitting - Level of Assistance: Not tested (comment) Static Sitting - Comment/# of Minutes: Sat EOB to wash back and change gown for 3 min with min  assist with left lean.   Static Standing Balance Static Standing - Balance Support: Bilateral upper extremity supported;During functional activity Static Standing - Level of Assistance: 1: +2 Total assist;Patient percentage (comment) (pt =50%) Static Standing - Comment/# of Minutes: Stands statically for up to 2 min with 2 person assist as above. Dynamic Standing Balance Dynamic Standing - Balance Support: Bilateral upper extremity supported;During functional activity Dynamic Standing - Level of Assistance: 1: +2 Total assist;Patient percentage (comment) (pt =50%) Dynamic Standing - Balance Activities: Lateral lean/weight shifting;Forward lean/weight shifting Dynamic Standing - Comments: Needs 2 person assist for pre gait activities needing a lot of assist and cues as well as facilitation.    Special needs/care consideration Bowel mgmt:incontinent Bladder mgmt: foley Diabetic mgmt Hgb A1 C 10.9   Previous Home Environment Living Arrangements: Alone  Lives With: Alone Available Help at Discharge: Family;Available 24 hours/day Type of Home: House Home Layout: One level Home Access: Other (comment) (1/2 step onto porch and then 1/2 step into house) Bathroom Shower/Tub: Tub/shower unit;Curtain Bathroom Toilet: Standard Bathroom Accessibility: Yes How Accessible: Accessible via wheelchair;Accessible via walker Home Care Services: No Additional Comments: pt's home more accessbile than Aunt's; Aunt's home has a converted garage room without bathroom so she plans d/c to pt's home  Discharge Living Setting Plans for Discharge Living Setting: Patient's home;Other (Comment) (family will provide 24/7 moderate physical assist at d/c) Type of Home at Discharge: House Discharge Home Layout: One level Discharge Home Access: Stairs to enter Entrance Stairs-Rails: None Entrance Stairs-Number of Steps: 1/2 step onto porch and 1/2 step into home Discharge Bathroom Shower/Tub: Tub/shower  unit;Curtain;Other (comment) (bathroom right off bedroom) Discharge Bathroom Toilet: Standard Discharge Bathroom Accessibility: Yes How Accessible: Accessible via wheelchair;Accessible via walker Does the patient have any problems obtaining your medications?: No  Social/Family/Support Systems Patient Roles: Parent;Other (Comment) (employee; Copy at [employer]) Contact Information: Aunt Annie Sable Anticipated Caregiver: Georgette Dover evenings and nights and Rhonisha (Tribby's goddaughter) during the day Anticipated Caregiver's Contact Information: cell [phone] Ability/Limitations of Caregiver: Georgette Dover is 52 yo and works Lawyer with [employer] and private duty; Jacqlyn Krauss is also a Surveyor, mining Availability: 24/7 Discharge Plan Discussed with Primary Caregiver: Yes Is Caregiver In Agreement with Plan?: Yes Does Caregiver/Family have Issues with Lodging/Transportation while Pt is in Rehab?: No  Goals/Additional Needs Patient/Family Goal for Rehab: min assist PT, OT, and SLP Expected length of stay: ELOS 20 to 24 days Dietary Needs: D1 diet with thin liquids Special Service Needs: right neglect Additional Information: family stays at his bedside during the day Pt/Family Agrees to Admission and willing to participate: Yes Program Orientation Provided & Reviewed with Pt/Caregiver Including Roles  & Responsibilities: Yes   Decrease burden of Care through IP rehab admission: n/a  Possible need for SNF placement upon discharge:not anticipated. Family all CNAs and do private duty. His son is  incarcerated, his daughter lives 2 1/2 hrs away. Celine Ahr Tribby arranging all caregivers.  Patient Condition: This patient's condition remains as documented in the consult dated 06/08/13, in which the Rehabilitation Physician determined and documented that the patient's condition is appropriate for intensive rehabilitative care in an inpatient rehabilitation facility. Will  admit to inpatient rehab today.  Preadmission Screen Completed By:  Clois Dupes, 06/08/2013 7:09 PM ______________________________________________________________________   Discussed status with Dr. Riley Kill on 06/09/13 at  1020 and received telephone approval for admission today.  Admission Coordinator:  Clois Dupes, time 1020 Date 06/09/13.

## 2013-06-08 NOTE — Progress Notes (Signed)
Physical Therapy Treatment Patient Details Name: OBINNA EHRESMAN MRN: [id number] DOB: [birth year] Today's Date: 06/08/2013 Time: 4098-1191 PT Time Calculation (min): 35 min  PT Assessment / Plan / Recommendation  History of Present Illness Pt admit with multiple infarcts of L cerebellum, occipital lobe, thalamus, cerebral peduncle.  Found on the floor of his home of indeterminate time so also with rhabdomyolosis, ketosis.  Pt has DM at baseline and hx of CVA without residual deficits.   PT Comments   Pt admitted with above. Pt currently with functional limitations due to balance and endurance deficits.  Pt will benefit from skilled PT to increase their independence and safety with mobility to allow discharge to the venue listed below.   Follow Up Recommendations  CIR;Supervision/Assistance - 24 hour     Does the patient have the potential to tolerate intense rehabilitation   yes          Equipment Recommendations  Other (comment) (TBA)    Recommendations for Other Services Rehab consult  Frequency Min 4X/week   Progress towards PT Goals Progress towards PT goals: Progressing toward goals  Plan Current plan remains appropriate;Frequency needs to be updated    Precautions / Restrictions Precautions Precautions: Fall Restrictions Weight Bearing Restrictions: No   Pertinent Vitals/Pain VSS, no pain    Mobility  Bed Mobility Bed Mobility: Not assessed Transfers Transfers: Sit to Stand;Stand to Sit Sit to Stand: 1: +2 Total assist;With upper extremity assist;From bed Sit to Stand: Patient Percentage: 50% Stand to Sit: 1: +2 Total assist;With upper extremity assist;To chair/3-in-1;With armrests Stand to Sit: Patient Percentage: 50% Stand Pivot Transfers: Not tested (comment) Details for Transfer Assistance: Pt needed assist for sit to stand using power up from left hemibody.  Pt able to weight bear on right LE but needed support of right knee to maintain standing.  Needed cues  to posture as pt tends to flex unless constantly cued. Worked on weight shifting bil directions with attempts to facilitate hip extension and knee extension for right LE control with trace activiation with facilitation and support.  Pt needed constant cueing to extend trunk as well.  Kept flexing his posture and needed facilitation and cuing for trunk control as well.  Tried to take a step forward with right LE but pt too weak.  With extensive assist and support pt could step left LE forward and back.  Toward end of session, tried to check vision a little but pt was still unable to track past midline unless given incr time.  Pt takes incr time to respond appropriately to commands on right side as well.  Pt appears to possibly have a field cut as well as he was having difficulty seeing objects held in front of him.  When asked if things were blurry, pt shook his head yes.  Asked Jeani Hawking, OT to come and evaluate vision further.   Ambulation/Gait Ambulation/Gait Assistance: Not tested (comment) Stairs: No Wheelchair Mobility Wheelchair Mobility: No Modified Rankin (Stroke Patients Only) Pre-Morbid Rankin Score: No symptoms Modified Rankin: Severe disability      PT Goals (current goals can now be found in the care plan section)    Visit Information  Last PT Received On: 06/08/13 Assistance Needed: +2 History of Present Illness: Pt admit with multiple infarcts of L cerebellum, occipital lobe, thalamus, cerebral peduncle.  Found on the floor of his home of indeterminate time so also with rhabdomyolosis, ketosis.  Pt has DM at baseline and hx of CVA without residual deficits.  Subjective Data  Subjective: "I will try." Slurred speech noted.   Cognition  Cognition Arousal/Alertness: Awake/alert Behavior During Therapy: Impulsive (labile) Overall Cognitive Status: Impaired/Different from baseline Area of Impairment: Attention;Following commands;Safety/judgement;Awareness;Problem  solving Current Attention Level: Focused Following Commands: Follows one step commands with increased time (Questionable neglect of right side ) Safety/Judgement: Decreased awareness of safety;Decreased awareness of deficits Problem Solving: Slow processing;Difficulty sequencing;Requires verbal cues;Requires tactile cues General Comments: All of above difficult to assess secondary to language difficulties.  Emotional lability throughout session.    Balance  Static Sitting Balance Static Sitting - Level of Assistance: Not tested (comment) Static Standing Balance Static Standing - Balance Support: Bilateral upper extremity supported;During functional activity Static Standing - Level of Assistance: 1: +2 Total assist;Patient percentage (comment) (pt =50%) Static Standing - Comment/# of Minutes: Stands statically for up to 2 min with 2 person assist as above. Dynamic Standing Balance Dynamic Standing - Balance Support: Bilateral upper extremity supported;During functional activity Dynamic Standing - Level of Assistance: 1: +2 Total assist;Patient percentage (comment) (pt =50%) Dynamic Standing - Balance Activities: Lateral lean/weight shifting;Forward lean/weight shifting Dynamic Standing - Comments: Needs 2 person assist for pre gait activities needing a lot of assist and cues as well as facilitation.  End of Session PT - End of Session Equipment Utilized During Treatment: Gait belt Activity Tolerance: Patient limited by fatigue Patient left: in chair;with call bell/phone within reach;with family/visitor present;with nursing/sitter in room Nurse Communication: Mobility status;Need for lift equipment       INGOLD,Mikaylah Libbey 06/08/2013, 1:32 PM [facility] Acute Rehabilitation [phone] (pager)

## 2013-06-08 NOTE — Progress Notes (Signed)
Patient undergoing carotid doppler study at this time.  Will transport to 4N27 when test is completed.  Whitney, RN called and notified at this time.  Nicole Cella, RN

## 2013-06-08 NOTE — Progress Notes (Signed)
Speech Language Pathology Treatment: Dysphagia;Cognitive-Linquistic  Patient Details Name: Adam Callahan MRN: [id number] DOB: May 01, 1961 Today's Date: 06/08/2013 Time: 1640-1710 SLP Time Calculation (min): 30 min  Assessment / Plan / Recommendation Clinical Impression  Pt seen for f/u swallowing and cognitive-linguistic treatment. Upon SLP arrival, pt laying reclined in bed with meal tray in front of him and spoon in his mouth, attempting to take a large bite of pureed solids. SLP intervened to provide assist with repositioning for safe PO intake and placed an additional sign at the [facility] to remind staff of recommendations. NT also informed of precautions and the need for full supervision for safety. Pt with noted right anterior spillage of pureed solids. He was observed to consume thin liquids via cup sips with Max verbal and tactile cues for small sip size. No overt s/s of aspiration observed.   Upon repositioning pt, SLP noted that the pt's gown and linens were wet, and there was liquid on the floor beneath his bed as well. RN reportedly aware of soaked linens due to leaking IV, and SLP requested assistance for cleaning. Pt tearful and appreciative, saying "thank you." SLP facilitated in changing linens and gown, and provided Min cues for following commands throughout. NT educated regarding pt's current cognitive status, particularly his inability to functionally utilize his call bell to ask for assistance; pt required Max multimodal cues to utilize. Pt's speech remains moderately-severely dysarthric, however can verbalize wants/needs by answering yes/no questions accurately with Mod I. Pt is emotional today, frequently tearful throughout session.    HPI HPI: Pt admit with multiple infarcts of L cerebellum, occipital lobe, thalamus, cerebral peduncle.  Found on the floor of his home of indeterminate time so also with rhabdomyolosis, ketosis.  Pt has DM at baseline and hx of CVA without residual  deficits.    Pertinent Vitals N/A  SLP Plan  Continue with current plan of care    Recommendations Diet recommendations: Dysphagia 1 (puree);Thin liquid Liquids provided via: Cup;No straw Medication Administration: Crushed with puree Supervision: Patient able to self feed;Full supervision/cueing for compensatory strategies Compensations: Slow rate;Small sips/bites;Check for pocketing;Check for anterior loss;Follow solids with liquid Postural Changes and/or Swallow Maneuvers: Seated upright 90 degrees;Upright 30-60 min after meal              General recommendations: Rehab consult Oral Care Recommendations: Oral care BID Follow up Recommendations: Inpatient Rehab Plan: Continue with current plan of care    GO     [name] 06/08/2013, 5:16 PM  [name], M.A. CCC-SLP [phone]

## 2013-06-08 NOTE — Progress Notes (Signed)
[facility] Progress Note [place] TEAM 1 - Stepdown ICU Team   KAIUS DAINO ZOX:[id number] DOB: [birth year] DOA: 06/06/2013 PCP: No PCP Per Patient  Brief narrative: 52 y.o. male with history of diabetes mellitus was brought to the ER after patient's family found him on the floor at his house. Patient was last seen normal 3 days prior to arrival. EMS was called after the patient was found down. He was found with symptoms c/w expressive aphasia and right-sided hemiplegia. CT of the head done in the ER showed multiple subacute nonhemorrhagic infarcts mild midline shift and mass effect. Neurologist on-call Dr. Amada Jupiter evaluated the patient. Admitting MD noted on initial exam he was able to follow commands but was not able to not give much history due to aphasia. Per the family he had a fall last month.  Assessment/Plan:    CVA (cerebral infarction) -Neuro following with full work up in process -cont PT/OT/SLP -MRA revealed left vertebral artery dissection (family says pt fell off ladder ~ 1 month ago) with associated embolus -Neuro added Plavix  -per SLP recs started D1 diet with thin liquids and has tolerated well -CIR eval in process    Dehydration -cont IVF but decrease to 50/hr and follow lytes    Diabetes mellitus type 2, uncontrolled -was not on meds at home  -cont SSI for now -added low dose Lantus this admission -HgbA1c was 10.9 -per 2011 ED note pt was on Metformin    Rhabdomyolysis - mild  -CPK trended down with hydration -cont IVF     Hypokalemia -oral replete    Severe sleep apnea -noted on prior PSG from 2006 -recommendations were for nocturnal CPAP 25 CWP and thus was started this admission    Dyslipidemia -start statin once allowed D3 diet   DVT prophylaxis: SCDs Code Status: Full Family Communication: Patient and 4 family members at the bedside Disposition Plan/Expected LOS: Transfer to  telemetry  Consultants: Neurology  Procedures: Carotid Dopplers pending  2-D echocardiogram  - Left ventricle: The cavity size was normal. There was mild concentric hypertrophy. Systolic function was normal. The estimated ejection fraction was in the range of 55% to 60%. Although no diagnostic regional wall motion abnormality was identified, this possibility cannot be completely excluded on the basis of this study. Doppler parameters are consistent with abnormal left ventricular relaxation (grade 1 diastolic dysfunction). - Pulmonary arteries: Systolic pressure was mildly increased. PA peak pressure: 34mm Hg (S). Impressions:  - No cardiac source of emboli was indentified  MRI/MRA head   Antibiotics: None  HPI/Subjective: Patient alert. Still dysarthric. In good spirits - no complaints  Objective: Blood pressure 132/79, pulse 72, temperature 97.8 F (36.6 C), temperature source Oral, resp. rate 18, height 5\' 5"  (1.651 m), weight 170 lb 13.7 oz (77.5 kg), SpO2 95.00%.  Intake/Output Summary (Last 24 hours) at 06/08/13 1112 Last data filed at 06/08/13 0745  Gross per 24 hour  Intake   2000 ml  Output    625 ml  Net   1375 ml   Exam: General: No acute respiratory distress Lungs: Clear to auscultation bilaterally without wheezes or crackles, RA Cardiovascular: Regular rate and rhythm without murmur gallop or rub normal S1 and S2, no peripheral edema or JVD Abdomen: Nontender, nondistended, soft, bowel sounds positive, no rebound, no ascites, no appreciable mass Musculoskeletal: No significant cyanosis, clubbing of bilateral lower extremities Neurological: Alert and oriented to at least name noting exam severely limited by expressive aphasia and dysarthria, noted with right  hemiplegia but note able to slightly move roght foot and leg and trace movement right arm, no ptosis, visual field preference to left c/w right hemianopsia  Scheduled Meds:  Scheduled Meds: . aspirin  EC  81 mg Oral Daily  . atorvastatin  20 mg Oral q1800  . clopidogrel  75 mg Oral Q breakfast  . enoxaparin (LOVENOX) injection  40 mg Subcutaneous Daily  . insulin aspart  0-9 Units Subcutaneous TID WC  . insulin glargine  5 Units Subcutaneous Daily    Data Reviewed: Basic Metabolic Panel:  Recent Labs Lab 06/06/13 1806 06/06/13 1837 06/07/13 0525 06/08/13 0620  NA 137 140 141 136  K 3.6 3.3* 3.3* 3.5  CL 97 101 103 102  CO2 27  --  27 23  GLUCOSE 265* 272* 187* 134*  BUN 20 21 17 12   CREATININE 0.69 1.00 0.83 0.63  CALCIUM 9.2  --  8.7 8.3*   Liver Function Tests:  Recent Labs Lab 06/06/13 1806 06/07/13 0525  AST 26 21  ALT 22 18  ALKPHOS 71 63  BILITOT 0.8 0.7  PROT 8.1 7.4  ALBUMIN 3.7 3.2*   CBC:  Recent Labs Lab 06/06/13 1806 06/06/13 1837 06/07/13 0525 06/08/13 0620  WBC 11.5*  --  9.4 7.7  NEUTROABS 8.3*  --  6.0  --   HGB 15.9 16.0 13.4 12.4*  HCT 43.8 47.0 40.2 35.4*  MCV 84.2  --  84.3 83.9  PLT 301  --  293 277   Cardiac Enzymes:  Recent Labs Lab 06/06/13 1806 06/06/13 2252 06/07/13 0525  CKTOTAL 1086*  --  754*  TROPONINI <0.30 <0.30  --    BNP (last 3 results) No results found for this basename: PROBNP,  in the last 8760 hours CBG:  Recent Labs Lab 06/07/13 0456 06/07/13 0814 06/07/13 1323 06/07/13 1944 06/08/13 0747  GLUCAP 209* 184* 132* 236* 135*    Recent Results (from the past 240 hour(s))  MRSA PCR SCREENING     Status: None   Collection Time    06/06/13 10:29 PM      Result Value Range Status   MRSA by PCR NEGATIVE  NEGATIVE Final   Comment:            The GeneXpert MRSA Assay (FDA     approved for NASAL specimens     only), is one component of a     comprehensive MRSA colonization     surveillance program. It is not     intended to diagnose MRSA     infection nor to guide or     monitor treatment for     MRSA infections.     Studies:  Recent x-ray studies have been reviewed in detail by the  Attending Physician     Junious Silk, ANP [facility] Office  [phone] Pager [phone]  **If unable to reach the above provider after paging please contact the Flow Manager @ [phone]  On-Call/Text Page:      [email or web address]      password [email or web address]  If 7PM-7AM, please contact night-coverage [email or web address] Password [email or web address] 06/08/2013, 11:12 AM   LOS: 2 days    I have examined the patient, reviewed the chart and modified the above note which I agree with.   Gaddiel Cullens,MD [phone] 06/08/2013, 5:12 PM

## 2013-06-08 NOTE — Progress Notes (Signed)
Bilateral carotid artery duplex:  1-39% ICA stenosis.  Vertebral artery flow is antegrade.     

## 2013-06-08 NOTE — Progress Notes (Signed)
Stroke Team Progress Note  HISTORY Adam Callahan is a 52 y.o. male with a history of diabetes as well as fall one month ago who presents with right-sided weakness that began Thursday evening. He is unable to speak, due to severe dysarthria and possibly aphasia, but does respond appropriately with head nods and shakes.   He was last seen after being dropped off and then was not heard from until today when his daughter found him on the floor. He was brought to the ER where a CT shows PCA and PICA distribution infarcts   LKW: Thursday evening, 11/13  tpa given?: no, outside of window.  NIHSS: 21   Patient was not a TPA candidate secondary to out of time frame. He was admitted for further evaluation and treatment.  SUBJECTIVE Family in room. Patient much more awake today. Sitting in chair. Following simple commands. Answering simple questions, shaking head yes, no.  OBJECTIVE Most recent Vital Signs: Filed Vitals:   06/08/13 0500 06/08/13 0600 06/08/13 0700 06/08/13 0744  BP:  131/72  132/79  Pulse:    72  Temp:    97.8 F (36.6 C)  TempSrc:    Oral  Resp: 16 19 18 18   Height:      Weight:      SpO2:    95%   CBG (last 3)   Recent Labs  06/07/13 0814 06/07/13 1323 06/07/13 1944  GLUCAP 184* 132* 236*    IV Fluid Intake:   . 0.9 % NaCl with KCl 20 mEq / L 100 mL/hr at 06/07/13 1829    MEDICATIONS  . aspirin  325 mg Oral Daily  . clopidogrel  75 mg Oral Q breakfast  . enoxaparin (LOVENOX) injection  40 mg Subcutaneous Daily  . influenza vac split quadrivalent PF  0.5 mL Intramuscular Tomorrow-1000  . insulin aspart  0-9 Units Subcutaneous TID WC  . insulin glargine  5 Units Subcutaneous Daily  . pneumococcal 23 valent vaccine  0.5 mL Intramuscular Tomorrow-1000   PRN:    Diet:  Dysphagia  Activity:  OOB with assist DVT Prophylaxis:  lovenox  CLINICALLY SIGNIFICANT STUDIES Basic Metabolic Panel:   Recent Labs Lab 06/07/13 0525 06/08/13 0620  NA 141 136   K 3.3* 3.5  CL 103 102  CO2 27 23  GLUCOSE 187* 134*  BUN 17 12  CREATININE 0.83 0.63  CALCIUM 8.7 8.3*   Liver Function Tests:   Recent Labs Lab 06/06/13 1806 06/07/13 0525  AST 26 21  ALT 22 18  ALKPHOS 71 63  BILITOT 0.8 0.7  PROT 8.1 7.4  ALBUMIN 3.7 3.2*   CBC:  Recent Labs Lab 06/06/13 1806  06/07/13 0525 06/08/13 0620  WBC 11.5*  --  9.4 7.7  NEUTROABS 8.3*  --  6.0  --   HGB 15.9  < > 13.4 12.4*  HCT 43.8  < > 40.2 35.4*  MCV 84.2  --  84.3 83.9  PLT 301  --  293 277  < > = values in this interval not displayed. Coagulation:   Recent Labs Lab 06/06/13 1806  LABPROT 13.4  INR 1.04   Cardiac Enzymes:   Recent Labs Lab 06/06/13 1806 06/06/13 2252 06/07/13 0525  CKTOTAL 1086*  --  754*  TROPONINI <0.30 <0.30  --    Urinalysis:   Recent Labs Lab 06/06/13 1831  COLORURINE YELLOW  LABSPEC 1.037*  PHURINE 5.0  GLUCOSEU >1000*  HGBUR MODERATE*  BILIRUBINUR SMALL*  KETONESUR >80*  PROTEINUR  100*  UROBILINOGEN 1.0  NITRITE NEGATIVE  LEUKOCYTESUR NEGATIVE   Lipid Panel    Component Value Date/Time   CHOL 204* 06/07/2013 0525   TRIG 92 06/07/2013 0525   HDL 42 06/07/2013 0525   CHOLHDL 4.9 06/07/2013 0525   VLDL 18 06/07/2013 0525   LDLCALC 144* 06/07/2013 0525   HgbA1C  Lab Results  Component Value Date   HGBA1C 10.9* 06/07/2013    Urine Drug Screen:     Component Value Date/Time   LABOPIA NONE DETECTED 06/06/2013 1831   COCAINSCRNUR NONE DETECTED 06/06/2013 1831   LABBENZ NONE DETECTED 06/06/2013 1831   AMPHETMU NONE DETECTED 06/06/2013 1831   THCU NONE DETECTED 06/06/2013 1831   LABBARB NONE DETECTED 06/06/2013 1831    Alcohol Level:   Recent Labs Lab 06/06/13 1806  ETH <11    Ct Head Wo Contrast 06/06/2013  Multiple subacute nonhemorrhagic infarctions including the left cerebellum, left occipital lobe, left temporal lobe, and left thalamus and left cerebral peduncle.  Slight midline shift and mass effect.  No  hemorrhage.     MRI of the brain   Acute infarct left PCA territory. There is acute infarction  involving the left mid and posterior temporal lobe extending into  the left occipital lobe. Acute infarct also present in the left  thalamus and left midbrain. Acute infarct in the left inferior  cerebellum. Acute infarct in the splenium of the corpus callosum.  Tiny acute infarct right posterior inferior cerebellar artery  territory.  MRA of the brain   Occlusions of the left posterior cerebral artery and left posterior  inferior cerebellar artery compatible with acute infarct and  probable embolism.  Moderate to severe focal stenosis in the proximal aspect of the  temporal branch of the left middle cerebral artery.  Segmental irregularity left vertebral artery most likely a cause for  embolus causing acute infarction. Favor dissection given the recent  history of a fall. Atherosclerotic disease also possible.   2D Echocardiogram  EF 55%, LA normal in size.  Carotid Doppler  See angio  CXR    EKG  normal sinus rhythm.   Therapy Recommendations   Physical Exam   GENERAL EXAM: Patient is in no distress  CARDIOVASCULAR: Regular rate and rhythm, no murmurs, no carotid bruits  NEUROLOGIC: MENTAL STATUS: SLEEPY. WAKES TO VOICE. DECR FLUENCY. Comprehension intact CRANIAL NERVE: pupils equal and reactive to light, RIGHT HEMIANOPSIA. extraocular muscles intact, no nystagmus, facial sensation and strength symmetric, uvula midline, shoulder shrug symmetric, tongue midline. MOTOR: RUE 1-2, RLE 1-2, LUE/LLE 5 SENSORY: normal and symmetric to light touch COORDINATION: LIMITED IN RUE DUE TO WEAKNESS REFLEXES: deep tendon reflexes present and symmetric GAIT/STATION: SITTING IN CHAIR.   ASSESSMENT [name] is a 52 y.o. male presenting with right hemiparesis. Patient had a fall 1 month ago. CT Imaging confirms a left cerebellum, left occipital lobe, left temporal lobe, and left  thalamus and left cerebral peduncle non-hemorrhagic infarcts. Infarct felt to be embolic secondary to unknown source. MRI/MRA images head/neck pending to access for dissection. On no antithrombotics prior to admission. Now on baby aspirin 81mg  daily plus plavix 75mg  daily for secondary stroke prevention. Patient with cognitive and speech deficits, right hemiparesis. Work up underway.   Hyperlipidemia, LDL 144, goal < 70, add statin once able to take NPO and once patient has recovered from Rhabdo  Diabetes mellitus, type 2, uncontrolled 10.9.  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  Risk factor modification  CIR recommended. Once bed available, may transfer to CIR.  Risk factor modification  Add statin.  Gwendolyn Lima. Manson Passey, [facility], MBA, MHA [facility] Pager: [phone] 06/08/2013 7:59 AM  I evaluated and examined patient, reviewed records, labs and imaging, and agree with note and plan.  Suanne Marker, MD 06/08/2013, 4:10 PM Certified in Neurology, Neurophysiology and Neuroimaging [facility] - Stroke Team  Please refer to [email or web address] for on-call Stroke MD

## 2013-06-08 NOTE — Progress Notes (Signed)
I have insurance approval and can admit pt to inpt rehab tomorrow pending medical stability. Patient and his Georgette Dover are aware and in agreement. [phone]

## 2013-06-09 ENCOUNTER — Inpatient Hospital Stay (HOSPITAL_COMMUNITY)
Admission: RE | Admit: 2013-06-09 | Discharge: 2013-06-29 | DRG: 945 | Disposition: A | Discharge status: Home / Self Care | Payer: BC Managed Care – PPO | Source: Intra-hospital | Attending: Physical Medicine & Rehabilitation | Admitting: Physical Medicine & Rehabilitation

## 2013-06-09 DIAGNOSIS — E876 Hypokalemia: Secondary | ICD-10-CM

## 2013-06-09 DIAGNOSIS — Z87891 Personal history of nicotine dependence: Secondary | ICD-10-CM

## 2013-06-09 DIAGNOSIS — E860 Dehydration: Secondary | ICD-10-CM

## 2013-06-09 DIAGNOSIS — H53469 Homonymous bilateral field defects, unspecified side: Secondary | ICD-10-CM | POA: Diagnosis present

## 2013-06-09 DIAGNOSIS — G8190 Hemiplegia, unspecified affecting unspecified side: Secondary | ICD-10-CM | POA: Diagnosis present

## 2013-06-09 DIAGNOSIS — K5900 Constipation, unspecified: Secondary | ICD-10-CM | POA: Diagnosis not present

## 2013-06-09 DIAGNOSIS — E1149 Type 2 diabetes mellitus with other diabetic neurological complication: Secondary | ICD-10-CM | POA: Diagnosis present

## 2013-06-09 DIAGNOSIS — R4701 Aphasia: Secondary | ICD-10-CM | POA: Diagnosis present

## 2013-06-09 DIAGNOSIS — Z5189 Encounter for other specified aftercare: Principal | ICD-10-CM

## 2013-06-09 DIAGNOSIS — G4733 Obstructive sleep apnea (adult) (pediatric): Secondary | ICD-10-CM | POA: Diagnosis present

## 2013-06-09 DIAGNOSIS — I639 Cerebral infarction, unspecified: Secondary | ICD-10-CM

## 2013-06-09 DIAGNOSIS — I6340 Cerebral infarction due to embolism of unspecified cerebral artery: Secondary | ICD-10-CM

## 2013-06-09 DIAGNOSIS — R1310 Dysphagia, unspecified: Secondary | ICD-10-CM | POA: Diagnosis present

## 2013-06-09 DIAGNOSIS — E785 Hyperlipidemia, unspecified: Secondary | ICD-10-CM | POA: Diagnosis present

## 2013-06-09 DIAGNOSIS — I6330 Cerebral infarction due to thrombosis of unspecified cerebral artery: Secondary | ICD-10-CM

## 2013-06-09 DIAGNOSIS — Z79899 Other long term (current) drug therapy: Secondary | ICD-10-CM

## 2013-06-09 DIAGNOSIS — Z794 Long term (current) use of insulin: Secondary | ICD-10-CM

## 2013-06-09 DIAGNOSIS — E1142 Type 2 diabetes mellitus with diabetic polyneuropathy: Secondary | ICD-10-CM | POA: Diagnosis present

## 2013-06-09 LAB — CREATININE, SERUM
Creatinine, Ser: 0.63 mg/dL (ref 0.50–1.35)
GFR calc non Af Amer: >90 mL/min (ref >90)

## 2013-06-09 LAB — GLUCOSE, CAPILLARY
Glucose-Capillary: 182 mg/dL — ABNORMAL HIGH (ref 70–99)
Glucose-Capillary: 247 mg/dL — ABNORMAL HIGH (ref 70–99)
Glucose-Capillary: 272 mg/dL — ABNORMAL HIGH (ref 70–99)

## 2013-06-09 LAB — CBC
HCT: 37.1 % — ABNORMAL LOW (ref 39.0–52.0)
MCHC: 35.8 g/dL (ref 30.0–36.0)
MCV: 82.6 fL (ref 78.0–100.0)
RBC: 4.49 MIL/uL (ref 4.22–5.81)
RDW: 11.9 % (ref 11.5–15.5)

## 2013-06-09 MED ORDER — INSULIN GLARGINE 100 UNIT/ML ~~LOC~~ SOLN
5.0000 [IU] | Freq: Every day | SUBCUTANEOUS | Status: DC
  Administered 2013-06-10 – 2013-06-11 (×2): 5 [IU] via SUBCUTANEOUS
  Filled 2013-06-09 (×2): qty 0.05

## 2013-06-09 MED ORDER — ENOXAPARIN SODIUM 40 MG/0.4ML ~~LOC~~ SOLN
40.0000 mg | Freq: Every day | SUBCUTANEOUS | Status: DC
  Administered 2013-06-10 – 2013-06-29 (×20): 40 mg via SUBCUTANEOUS
  Filled 2013-06-09 (×22): qty 0.4

## 2013-06-09 MED ORDER — ASPIRIN 81 MG PO CHEW
81.0000 mg | CHEWABLE_TABLET | Freq: Every day | ORAL | Status: DC
  Administered 2013-06-09: 81 mg via ORAL
  Filled 2013-06-09: qty 1

## 2013-06-09 MED ORDER — INSULIN ASPART 100 UNIT/ML ~~LOC~~ SOLN: 0.0000 [IU] | Freq: Three times a day (TID) | SUBCUTANEOUS | Status: DC

## 2013-06-09 MED ORDER — ATORVASTATIN CALCIUM 20 MG PO TABS
20.0000 mg | ORAL_TABLET | Freq: Every day | ORAL | Status: DC
  Administered 2013-06-09 – 2013-06-28 (×20): 20 mg via ORAL
  Filled 2013-06-09 (×21): qty 1

## 2013-06-09 MED ORDER — ASPIRIN 81 MG PO CHEW: 81.0000 mg | CHEWABLE_TABLET | Freq: Every day | ORAL | Status: DC

## 2013-06-09 MED ORDER — ATORVASTATIN CALCIUM 20 MG PO TABS: 20.0000 mg | ORAL_TABLET | Freq: Every day | ORAL | Status: DC

## 2013-06-09 MED ORDER — CLOPIDOGREL BISULFATE 75 MG PO TABS
75.0000 mg | ORAL_TABLET | Freq: Every day | ORAL | Status: DC
  Administered 2013-06-10 – 2013-06-29 (×20): 75 mg via ORAL
  Filled 2013-06-09 (×21): qty 1

## 2013-06-09 MED ORDER — INSULIN GLARGINE 100 UNIT/ML ~~LOC~~ SOLN: 5.0000 [IU] | Freq: Every day | SUBCUTANEOUS | Status: DC

## 2013-06-09 MED ORDER — ACETAMINOPHEN 500 MG PO TABS: 1000.0000 mg | ORAL_TABLET | Freq: Four times a day (QID) | ORAL | Status: DC | PRN

## 2013-06-09 MED ORDER — ENOXAPARIN SODIUM 40 MG/0.4ML ~~LOC~~ SOLN: 40.0000 mg | SUBCUTANEOUS | Status: DC

## 2013-06-09 MED ORDER — CLOPIDOGREL BISULFATE 75 MG PO TABS
75.0000 mg | ORAL_TABLET | Freq: Every day | ORAL | Status: DC
Start: 2013-06-09 — End: 2013-06-29

## 2013-06-09 MED ORDER — ONDANSETRON HCL 4 MG PO TABS: 4.0000 mg | ORAL_TABLET | Freq: Four times a day (QID) | ORAL | Status: DC | PRN

## 2013-06-09 MED ORDER — SORBITOL 70 % SOLN
30.0000 mL | Freq: Every day | Status: DC | PRN
  Administered 2013-06-22 – 2013-06-27 (×4): 30 mL via ORAL
  Filled 2013-06-09 (×4): qty 30

## 2013-06-09 MED ORDER — ONDANSETRON HCL 4 MG/2ML IJ SOLN: 4.0000 mg | Freq: Four times a day (QID) | INTRAMUSCULAR | Status: DC | PRN

## 2013-06-09 MED ORDER — ACETAMINOPHEN 325 MG PO TABS
325.0000 mg | ORAL_TABLET | ORAL | Status: DC | PRN
  Administered 2013-06-09 – 2013-06-16 (×18): 650 mg via ORAL
  Administered 2013-06-16: 325 mg via ORAL
  Administered 2013-06-17 – 2013-06-21 (×6): 650 mg via ORAL
  Filled 2013-06-09 (×28): qty 2

## 2013-06-09 MED ORDER — INSULIN ASPART 100 UNIT/ML ~~LOC~~ SOLN
0.0000 [IU] | Freq: Three times a day (TID) | SUBCUTANEOUS | Status: DC
  Administered 2013-06-09 – 2013-06-10 (×2): 5 [IU] via SUBCUTANEOUS
  Administered 2013-06-10: 3 [IU] via SUBCUTANEOUS
  Administered 2013-06-10: 7 [IU] via SUBCUTANEOUS
  Administered 2013-06-11 (×2): 5 [IU] via SUBCUTANEOUS
  Administered 2013-06-11: 7 [IU] via SUBCUTANEOUS
  Administered 2013-06-12: 5 [IU] via SUBCUTANEOUS

## 2013-06-09 NOTE — Discharge Instructions (Signed)
Stroke Prevention Some medical conditions and behaviors are associated with an increased chance of having a stroke. You may prevent a stroke by making healthy choices and managing medical conditions. Reduce your risk of having a stroke by:  Staying physically active. Get at least 30 minutes of activity on most or all days.  Not smoking. It may also be helpful to avoid exposure to secondhand smoke.  Limiting alcohol use. Moderate alcohol use is considered to be:  No more than 2 drinks per day for men.  No more than 1 drink per day for nonpregnant women.  Eating healthy foods.  Include 5 or more servings of fruits and vegetables a day.  Certain diets may be prescribed to address high blood pressure, high cholesterol, diabetes, or obesity.  Managing your cholesterol levels.  A low-saturated fat, low-trans fat, low-cholesterol, and high-fiber diet may control cholesterol levels.  Take any prescribed medicines to control cholesterol as directed by your caregiver.  Managing your diabetes.  A controlled-carbohydrate, controlled-sugar diet is recommended to manage diabetes.  Take any prescribed medicines to control diabetes as directed by your caregiver.  Controlling your high blood pressure (hypertension).  A low-salt (sodium), low-saturated fat, low-trans fat, and low-cholesterol diet is recommended to manage high blood pressure.  Take any prescribed medicines to control hypertension as directed by your caregiver.  Maintaining a healthy weight.  A reduced-calorie, low-sodium, low-saturated fat, low-trans fat, low-cholesterol diet is recommended to manage weight.  Stopping drug abuse.  Avoiding birth control pills.  Talk to your caregiver about the risks of taking birth control pills if you are over 35 years old, smoke, get migraines, or have ever had a blood clot.  Getting evaluated for sleep disorders (sleep apnea).  Talk to your caregiver about getting a sleep evaluation  if you snore a lot or have excessive sleepiness.  Taking medicines as directed by your caregiver.  For some people, aspirin or blood thinners (anticoagulants) are helpful in reducing the risk of forming abnormal blood clots that can lead to stroke. If you have the irregular heart rhythm of atrial fibrillation, you should be on a blood thinner unless there is a good reason you cannot take them.  Understand all your medicine instructions. SEEK IMMEDIATE MEDICAL CARE IF:   You have sudden weakness or numbness of the face, arm, or leg, especially on one side of the body.  You have sudden confusion.  You have trouble speaking (aphasia) or understanding.  You have sudden trouble seeing in one or both eyes.  You have sudden trouble walking.  You have dizziness.  You have a loss of balance or coordination.  You have a sudden, severe headache with no known cause.  You have new chest pain or an irregular heartbeat. Any of these symptoms may represent a serious problem that is an emergency. Do not wait to see if the symptoms will go away. Get medical help right away. Call your local emergency services (911 in [place] Do not drive yourself to the hospital. Document Released: 08/15/2004 Document Revised: 09/30/2011 Document Reviewed: 01/08/2013 [employer] Patient Information 2014 [employer]  

## 2013-06-09 NOTE — H&P (Signed)
Physical Medicine and Rehabilitation Admission H&P  Chief Complaint   Patient presents with   .  Cerebrovascular Accident   :  Chief complaint: Right-sided weakness  HPI: Adam Callahan is a 52 y.o. right-handed male with history of diabetes mellitus with peripheral neuropathy. Patient was independent prior to admission driving . Admitted 06/06/2013 with acute right-sided weakness and aphasia after being found on the floor by his family. MRI of the brain acute infarct left PCA territory. There is acute infarct involving the left mid and posterior temporal lobe extending into the left occipital lobe as well acute infarct present left thalamus and left midbrain, left inferior cerebellum. MRA of the head negative for aneurysm. MRI of the neck with occlusions versus embolus versus dissection of the left posterior cerebral artery and left posterior inferior cerebellar artery compatible with acute infarct. Echocardiogram with ejection fraction of 55% no wall motion abnormalities and grade 1 diastolic dysfunction. Carotid Dopplers with no ICA stenosis. Patient did not receive TPA. Neurology services consulted presently on aspirin and Plavix for CVA prophylaxis as well as subcutaneous Lovenox for DVT prophylaxis. Presently maintained on a dysphagia 1 thin liquid diet. Hemoglobin A1c 10.9 with insulin therapy as directed. Physical and occupational therapy evaluation completed 06/07/2013 with recommendations of physical medicine rehabilitation consult to consider inpatient rehabilitation services. Patient was felt to be a good candidate for inpatient rehabilitation services was admitted for comprehensive rehabilitation program  ROS Review of Systems  Unable to perform ROS: language  Past Medical History   Diagnosis  Date   .  Diabetes mellitus     History reviewed. No pertinent past surgical history.  Family History   Problem  Relation  Age of Onset   .  Cervical cancer  Mother    .  Hypertension  Sister     .  Diabetes Mellitus II  Maternal Uncle     Social History: reports that he has quit smoking. He does not have any smokeless tobacco history on file. He reports that he does not drink alcohol or use illicit drugs.  Allergies: No Known Allergies  Medications Prior to Admission   Medication  Sig  Dispense  Refill   .  diazepam (VALIUM) 5 MG tablet  Take 5 mg by mouth every 6 (six) hours as needed for muscle spasms.     Marland Kitchen  oxyCODONE-acetaminophen (PERCOCET/ROXICET) 5-325 MG per tablet  Take 1 tablet by mouth every 4 (four) hours as needed for severe pain.      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; 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, 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)  Comments: worked as Copy at [employer]; also Building services engineer Status:  Mobility:  Bed Mobility  Bed Mobility: Not assessed  Rolling Left: 4: Min assist;With rail  Left Sidelying to Sit: 4: Min assist;With rails;HOB elevated  Sitting -  Scoot to [place] of Bed: 4: Min assist  Transfers  Transfers: Sit to Stand;Stand to Sit  Sit to Stand: 1: +2 Total assist;With upper extremity assist;From bed  Sit to Stand: Patient Percentage: 50%  Stand to Sit: 1: +2 Total assist;With upper extremity assist;To chair/3-in-1;With armrests  Stand to Sit: Patient Percentage: 50%  Stand Pivot Transfers: Not tested  (comment)  Stand Pivot Transfers: Patient Percentage: 50%  Ambulation/Gait  Ambulation/Gait Assistance: Not tested (comment)  Stairs: No  Wheelchair Mobility  Wheelchair Mobility: No  ADL:  ADL  Eating/Feeding: NPO  Where Assessed - Eating/Feeding: Chair  Grooming: Teeth care;Minimal assistance (toothbrush with water)  Upper Body Bathing: Moderate assistance  Where Assessed - Upper Body Bathing: Supine, head of bed up  Lower Body Bathing: +1 Total assistance  Where Assessed - Lower Body Bathing: Supported sitting;Supported sit to stand  Upper Body Dressing: Maximal assistance  Where Assessed - Upper Body Dressing: Supported sitting  Lower Body Dressing: +1 Total assistance  Where Assessed - Lower Body Dressing: Supine, head of bed up  Toilet Transfer: +2 Total assistance  Toilet Transfer Method: Sit to stand  Equipment Used: Gait belt  Transfers/Ambulation Related to ADLs: +2 total assist pt 50% to transfer toward L side, non ambulatory  ADL Comments: Pt just back to bed and fatigued. Pt continues with Lt. gaze preference. when he attempts to locate items to the Rt, he turns his head to the Rt, with eyes deviating to the lt. Eyes will not cross midline despite max auditory and visual stimuli. Pt will locate Rt. UE with min verbal cues, but demonstrates no active movement despite max facilitation and attempts at bil. tasks - Rt. UE appears flaccid, but difficult to determine if it is truly flaccid, or if pt neglecting Rt. UE. Pt applied lotion to bil. hand and UE with mod A. Pt very fatigued and falling asleep so session terminated  Cognition:  Cognition  Overall Cognitive Status: Impaired/Different from baseline  Orientation Level: Oriented to person;Disoriented to time;Oriented to place  Attention: Focused  Focused Attention: Impaired  Focused Attention Impairment: Functional basic  Memory: Impaired  Memory Impairment: Decreased recall of new information  Awareness: Impaired   Awareness Impairment: Intellectual impairment  Problem Solving: Impaired  Problem Solving Impairment: Functional basic;Verbal basic  Safety/Judgment: Impaired  Cognition  Arousal/Alertness: Lethargic  Behavior During Therapy: Flat affect  Overall Cognitive Status: Impaired/Different from baseline  Area of Impairment: Attention;Following commands;Awareness;Problem solving  Current Attention Level: Sustained  Following Commands: Follows one step commands with increased time  Safety/Judgement: Decreased awareness of safety  Problem Solving: Slow processing;Difficulty sequencing;Requires verbal cues;Requires tactile cues  General Comments: difficult to accurately asses due to pt with limited verbalization today    Physical Exam:  Blood pressure 144/70, pulse 73, temperature 97.5 F (36.4 C), temperature source Oral, resp. rate 20, height 5\' 5"  (1.651 m), weight 77.5 kg (170 lb 13.7 oz), SpO2 97.00%.   Eyes:  Pupils round and reactive to light without nystagmus  Neck: Normal range of motion. Neck supple. No thyromegaly present.  Cardiovascular: Normal rate and regular rhythm. No murmur Respiratory: Effort normal and breath sounds normal. No respiratory distress. No wheezes or rales GI: Soft. Bowel sounds are normal. He exhibits no distension.  Musculoskeletal: He exhibits no edema.  Neurological: He is alert.  Patient is more alert and makes good eye contact with examiner. Speech is clearer today. He is oriented to name only. He follows basic demonstrate commands. RUE with trace to 1/5  grasp and proximal movement. He seemed to occasionally move the RUE more with spontaneous movement than planned. He appeared to attempt movement with his right HF and KE but was inconsistent (1/5) 0 to trac3 movement at the right ankle. Senses gross touch on the right but could not differentiate pain from light touch.  Right facial weakness. Gaze dysconjugate. Likely right inattention as well.  Skin: Skin is  warm and dry  Psych: pt is pleasant and appropriate Results for orders placed during the hospital encounter of 06/06/13 (from the past 48 hour(s))   GLUCOSE, CAPILLARY Status: Abnormal    Collection Time    06/07/13 8:14 AM   Result  Value  Range    Glucose-Capillary  184 (*)  70 - 99 mg/dL   GLUCOSE, CAPILLARY Status: Abnormal    Collection Time    06/07/13 1:23 PM   Result  Value  Range    Glucose-Capillary  132 (*)  70 - 99 mg/dL   GLUCOSE, CAPILLARY Status: Abnormal    Collection Time    06/07/13 7:44 PM   Result  Value  Range    Glucose-Capillary  236 (*)  70 - 99 mg/dL    Comment 1  Documented in Chart     Comment 2  Notify RN    CBC Status: Abnormal    Collection Time    06/08/13 6:20 AM   Result  Value  Range    WBC  7.7  4.0 - 10.5 K/uL    RBC  4.22  4.22 - 5.81 MIL/uL    Hemoglobin  12.4 (*)  13.0 - 17.0 g/dL    HCT  16.1 (*)  09.6 - 52.0 %    MCV  83.9  78.0 - 100.0 fL    MCH  29.4  26.0 - 34.0 pg    MCHC  35.0  30.0 - 36.0 g/dL    RDW  04.5  40.9 - 81.1 %    Platelets  277  150 - 400 K/uL   BASIC METABOLIC PANEL Status: Abnormal    Collection Time    06/08/13 6:20 AM   Result  Value  Range    Sodium  136  135 - 145 mEq/L    Potassium  3.5  3.5 - 5.1 mEq/L    Chloride  102  96 - 112 mEq/L    CO2  23  19 - 32 mEq/L    Glucose, Bld  134 (*)  70 - 99 mg/dL    BUN  12  6 - 23 mg/dL    Creatinine, Ser  9.14  0.50 - 1.35 mg/dL    Calcium  8.3 (*)  8.4 - 10.5 mg/dL    GFR calc non Af Amer  >90  >90 mL/min    GFR calc Af Amer  >90  >90 mL/min    Comment:  (NOTE)     The eGFR has been calculated using the CKD EPI equation.     This calculation has not been validated in all clinical situations.     eGFR's persistently <90 mL/min signify possible Chronic Kidney     Disease.   GLUCOSE, CAPILLARY Status: Abnormal    Collection Time    06/08/13 7:47 AM   Result  Value  Range    Glucose-Capillary  135 (*)  70 - 99 mg/dL   GLUCOSE, CAPILLARY Status: Abnormal     Collection Time    06/08/13 12:35 PM   Result  Value  Range  Glucose-Capillary  221 (*)  70 - 99 mg/dL   GLUCOSE, CAPILLARY Status: Abnormal    Collection Time    06/08/13 5:01 PM   Result  Value  Range    Glucose-Capillary  229 (*)  70 - 99 mg/dL    Comment 1  Notify RN     Comment 2  Documented in Chart    GLUCOSE, CAPILLARY Status: Abnormal    Collection Time    06/08/13 8:52 PM   Result  Value  Range    Glucose-Capillary  234 (*)  70 - 99 mg/dL    Mr Angiogram Neck W Wo Contrast  06/07/2013 CLINICAL DATA: Right-sided weakness. Stroke. Fall 1 month ago. EXAM: MRI HEAD WITHOUT AND WITH CONTRAST AND MRA HEAD WITHOUT AND WITH CONTRAST AND MRI NECK WITHOUT AND WITH CONTRAST TECHNIQUE: Multiplanar, multiecho pulse sequences of the brain and surrounding structures were obtained without and with intravenous contrast. Angiographic images of the head were obtained using MRA technique without and with contrast. Multiplanar, multiecho pulse sequences of the neck and surrounding structures were obtained without and with intravenous contrast. CONTRAST: 20mL MULTIHANCE GADOBENATE DIMEGLUMINE 529 MG/ML IV SOLN COMPARISON: CT head 06/06/2013 and 05/06/2013. FINDINGS: MRI HEAD FINDINGS Acute infarct left PCA territory. There is acute infarction involving the left mid and posterior temporal lobe extending into the left occipital lobe. Acute infarct also present in the left thalamus and left midbrain. Acute infarct in the left inferior cerebellum. Acute infarct in the splenium of the corpus callosum. Tiny acute infarct right posterior inferior cerebellar artery territory. Gradient echo images reveals a mild amount of hemorrhage in the left PICA infarct. There is also evidence of clot in the left posterior cerebral artery consistent with an embolus. Postcontrast imaging reveals mild enhancement in the left PICA infarct. No underlying mass lesion is identified. Ventricle size is normal. 7 mm midline shift toward  the right. Scattered small white matter hyperintensities bilaterally consistent with chronic microvascular ischemic change. MRA HEAD FINDINGS Both vertebral arteries are patent to the basilar. Right vertebral artery is dominant. Left posterior inferior cerebellar artery not identified and appears occluded. There is stenosis in the right posterior inferior cerebellar artery which could be embolus or atherosclerotic disease. The basilar is patent. AICA is patent bilaterally, left AICA is dominant. Bilateral superior cerebellar arteries are patent. Irregularity in the right posterior cerebral artery consistent with either embolus or atherosclerotic disease. Occluded left posterior cerebral artery consistent with an embolus causing stroke. Cavernous carotid is patent bilaterally. Anterior cerebral artery patent bilaterally. Right middle cerebral artery is widely patent. Focal moderate to severe stenosis in the proximal aspect of the temporal branch of the left middle cerebral artery. Negative for cerebral aneurysm. MRI NECK FINDINGS Two vessel arch. Left common carotid artery over arises from the innominate artery. Proximal great vessels are patent. Right carotid: Right common carotid artery widely patent. Atherosclerotic type plaque in the carotid bulb narrowing the lumen by approximately 25% diameter stenosis. Cervical carotid is patent. Left carotid: Left common carotid artery widely patent. Left carotid bifurcation is widely patent and the left cervical carotid is patent. Vertebral arteries: Right vertebral artery is dominant. There is a mild stenosis the distal right vertebral artery. There is irregularity in the mid left vertebral artery which could be due to dissection or atherosclerotic disease. Given the history of fall, and acute infarct, dissection is the most likely cause of this finding. IMPRESSION: Acute infarct left posterior cerebral artery territory involving the left inferior cerebellum, and left  temporal and occipital lobe as well as the left thalamus. There appears to be in embolus the left posterior cerebral artery. Mild amount of hemorrhage in the left posterior inferior cerebellar artery infarct. Occlusions of the left posterior cerebral artery and left posterior inferior cerebellar artery compatible with acute infarct and probable embolism. Moderate to severe focal stenosis in the proximal aspect of the temporal branch of the left middle cerebral artery. Segmental irregularity left vertebral artery most likely a cause for embolus causing acute infarction. Favor dissection given the recent history of a fall. Atherosclerotic disease also possible. Critical Value/emergent results were called by telephone at the time of interpretation on 06/07/2013 at 2:00 PM to Dr.Charles Roseanne Reno , who verbally acknowledged these results. Electronically Signed By: Marlan Palau M.D. On: 06/07/2013 14:03   Post Admission Physician Evaluation:  1. Functional deficits secondary to embolic left PCA infarct. 2. Patient is admitted to receive collaborative, interdisciplinary care between the physiatrist, rehab nursing staff, and therapy team. 3. Patient's level of medical complexity and substantial therapy needs in context of that medical necessity cannot be provided at a lesser intensity of care such as a SNF. 4. Patient has experienced substantial functional loss from his/her baseline which was documented above under the "Functional History" and "Functional Status" headings. Judging by the patient's diagnosis, physical exam, and functional history, the patient has potential for functional progress which will result in measurable gains while on inpatient rehab. These gains will be of substantial and practical use upon discharge in facilitating mobility and self-care at the household level. 5. Physiatrist will provide 24 hour management of medical needs as well as oversight of the therapy plan/treatment and provide  guidance as appropriate regarding the interaction of the two. 6. 24 hour rehab nursing will assist with bladder management, bowel management, safety, skin/wound care, disease management, medication administration, pain management and patient education and help integrate therapy concepts, techniques,education, etc. 7. PT will assess and treat for/with: Lower extremity strength, range of motion, stamina, balance, functional mobility, safety, adaptive techniques and equipment,NMR, cognitive perceptual and visual perceptual rx. Goals are: min assist. 8. OT will assess and treat for/with: ADL's, functional mobility, safety, upper extremity strength, adaptive techniques and equipment, NMR, CPT/VPT. Goals are: min to mod assist. 9. SLP will assess and treat for/with: speech, swallowing, cognition. Goals are: min assist. 10. Case Management and Social Worker will assess and treat for psychological issues and discharge planning. 11. Team conference will be held weekly to assess progress toward goals and to determine barriers to discharge. 12. Patient will receive at least 3 hours of therapy per day at least 5 days per week. 13. ELOS: 20-24 days  14. Prognosis: excellent   Medical Problem List and Plan:  1. Embolic left PCA infarct  2. DVT Prophylaxis/Anticoagulation: Subcutaneous Lovenox. Monitor platelet counts and any signs of bleeding  3. Pain Management: Tylenol as needed. Monitor with increased mobility  4. Neuropsych: This patient is not capable of making decisions on his own behalf.  5. Dysphagia/aphasia. Dysphagia 1 and liquids. Monitor for any signs of aspiration. Followup speech therapy.  6. Diabetes mellitus with peripheral neuropathy. Hemoglobin A1c 10.9. Lantus insulin 5 units subcutaneous daily. Check CBGs a.c. and at bedtime. Provide diabetic teaching.  7. Hyperlipidemia. Lipitor   Ranelle Oyster, MD, [facility] Physical Medicine & Rehabilitation   06/09/2013

## 2013-06-09 NOTE — Progress Notes (Signed)
Pt. DC to [facility].  Family member accompanied him.  Report given to [facility]

## 2013-06-09 NOTE — Progress Notes (Signed)
Stroke Team Progress Note  HISTORY [name] is a 52 y.o. male with a history of diabetes as well as fall one month ago who presents with right-sided weakness that began Thursday evening. He is unable to speak, due to severe dysarthria and possibly aphasia, but does respond appropriately with head nods and shakes.   He was last seen after being dropped off and then was not heard from until today when his daughter found him on the floor. He was brought to the ER where a CT shows PCA and PICA distribution infarcts   LKW: Thursday evening, 11/13  tpa given?: no, outside of window.  NIHSS: 21   Patient was not a TPA candidate secondary to out of time frame. He was admitted for further evaluation and treatment.  SUBJECTIVE Patient stronger. To CIR today.  OBJECTIVE Most recent Vital Signs: Filed Vitals:   06/08/13 2230 06/09/13 0301 06/09/13 0713 06/09/13 1013  BP:  144/70 158/90 156/76  Pulse: 91 73 72 86  Temp:  97.5 F (36.4 C) 98 F (36.7 C) 98.2 F (36.8 C)  TempSrc:  Oral Oral Oral  Resp: 20 20 18 18   Height:      Weight:      SpO2: 99% 97% 100% 100%   CBG (last 3)   Recent Labs  06/08/13 1701 06/08/13 2052 06/09/13 0713  GLUCAP 229* 234* 182*    IV Fluid Intake:   . 0.9 % NaCl with KCl 20 mEq / L 50 mL/hr at 06/08/13 1553    MEDICATIONS  . aspirin  81 mg Oral Daily  . atorvastatin  20 mg Oral q1800  . clopidogrel  75 mg Oral Q breakfast  . enoxaparin (LOVENOX) injection  40 mg Subcutaneous Daily  . insulin aspart  0-9 Units Subcutaneous TID WC  . insulin glargine  5 Units Subcutaneous Daily  . potassium chloride  40 mEq Oral Once   PRN:    Diet:  Dysphagia  Activity:  OOB with assist DVT Prophylaxis:  lovenox  CLINICALLY SIGNIFICANT STUDIES Basic Metabolic Panel:   Recent Labs Lab 06/07/13 0525 06/08/13 0620  NA 141 136  K 3.3* 3.5  CL 103 102  CO2 27 23  GLUCOSE 187* 134*  BUN 17 12  CREATININE 0.83 0.63  CALCIUM 8.7 8.3*   Liver  Function Tests:   Recent Labs Lab 06/06/13 1806 06/07/13 0525  AST 26 21  ALT 22 18  ALKPHOS 71 63  BILITOT 0.8 0.7  PROT 8.1 7.4  ALBUMIN 3.7 3.2*   CBC:  Recent Labs Lab 06/06/13 1806  06/07/13 0525 06/08/13 0620  WBC 11.5*  --  9.4 7.7  NEUTROABS 8.3*  --  6.0  --   HGB 15.9  < > 13.4 12.4*  HCT 43.8  < > 40.2 35.4*  MCV 84.2  --  84.3 83.9  PLT 301  --  293 277  < > = values in this interval not displayed. Coagulation:   Recent Labs Lab 06/06/13 1806  LABPROT 13.4  INR 1.04   Cardiac Enzymes:   Recent Labs Lab 06/06/13 1806 06/06/13 2252 06/07/13 0525  CKTOTAL 1086*  --  754*  TROPONINI <0.30 <0.30  --    Urinalysis:   Recent Labs Lab 06/06/13 1831  COLORURINE YELLOW  LABSPEC 1.037*  PHURINE 5.0  GLUCOSEU >1000*  HGBUR MODERATE*  BILIRUBINUR SMALL*  KETONESUR >80*  PROTEINUR 100*  UROBILINOGEN 1.0  NITRITE NEGATIVE  LEUKOCYTESUR NEGATIVE   Lipid Panel  Component Value Date/Time   CHOL 204* 06/07/2013 0525   TRIG 92 06/07/2013 0525   HDL 42 06/07/2013 0525   CHOLHDL 4.9 06/07/2013 0525   VLDL 18 06/07/2013 0525   LDLCALC 144* 06/07/2013 0525   HgbA1C  Lab Results  Component Value Date   HGBA1C 10.9* 06/07/2013    Urine Drug Screen:     Component Value Date/Time   LABOPIA NONE DETECTED 06/06/2013 1831   COCAINSCRNUR NONE DETECTED 06/06/2013 1831   LABBENZ NONE DETECTED 06/06/2013 1831   AMPHETMU NONE DETECTED 06/06/2013 1831   THCU NONE DETECTED 06/06/2013 1831   LABBARB NONE DETECTED 06/06/2013 1831    Alcohol Level:   Recent Labs Lab 06/06/13 1806  ETH <11    Ct Head Wo Contrast 06/06/2013  Multiple subacute nonhemorrhagic infarctions including the left cerebellum, left occipital lobe, left temporal lobe, and left thalamus and left cerebral peduncle.  Slight midline shift and mass effect.  No hemorrhage.     MRI of the brain   Acute infarct left PCA territory. There is acute infarction  involving the left mid and  posterior temporal lobe extending into  the left occipital lobe. Acute infarct also present in the left  thalamus and left midbrain. Acute infarct in the left inferior  cerebellum. Acute infarct in the splenium of the corpus callosum.  Tiny acute infarct right posterior inferior cerebellar artery  territory.  MRA of the brain   Occlusions of the left posterior cerebral artery and left posterior  inferior cerebellar artery compatible with acute infarct and  probable embolism.  Moderate to severe focal stenosis in the proximal aspect of the  temporal branch of the left middle cerebral artery.  Segmental irregularity left vertebral artery most likely a cause for  embolus causing acute infarction. Favor dissection given the recent  history of a fall. Atherosclerotic disease also possible.   2D Echocardiogram  EF 55%, LA normal in size.  Carotid Doppler  See angio  CXR    EKG  normal sinus rhythm.   Therapy Recommendations   Physical Exam   GENERAL EXAM: Patient is in no distress  CARDIOVASCULAR: Regular rate and rhythm, no murmurs, no carotid bruits  NEUROLOGIC: MENTAL STATUS: SLEEPY. WAKES TO VOICE. DECR FLUENCY. Comprehension intact CRANIAL NERVE: pupils equal and reactive to light, RIGHT HEMIANOPSIA. extraocular muscles intact, no nystagmus, facial sensation and strength symmetric, uvula midline, shoulder shrug symmetric, tongue midline. MOTOR: RUE 1-2, RLE 1-2, LUE/LLE 5 SENSORY: normal and symmetric to light touch COORDINATION: LIMITED IN RUE DUE TO WEAKNESS REFLEXES: deep tendon reflexes present and symmetric GAIT/STATION: SITTING IN CHAIR.   ASSESSMENT [name] is a 52 y.o. male presenting with right hemiparesis. Patient had a fall 1 month ago. CT Imaging confirms a left cerebellum, left occipital lobe, left temporal lobe, and left thalamus and left cerebral peduncle non-hemorrhagic infarcts. MRI/A shows a segmental irregularity left vertebral artery most  likely a cause for  embolus causing acute infarction. Favor dissection given the recent history of a fall. On no antithrombotics prior to admission. Now on baby aspirin 81mg  daily plus plavix 75mg  daily (for dissection) for secondary stroke prevention. Patient with cognitive and speech deficits, right hemiparesis.    Hyperlipidemia, LDL 144, goal < 70, on statin  Diabetes mellitus, type 2, uncontrolled 10.9.  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  Risk factor modification  CIR today  Have patient follow up with Dr. Pearlean Brownie in 2 months in stroke clinic.  Gwendolyn Lima. Manson Passey, [facility], MBA, MHA [facility] Pager: [phone] 06/09/2013 10:48 AM

## 2013-06-09 NOTE — Progress Notes (Signed)
Pt was placed on CPAP on auto titrate with 5 min-20 max.  Pt  Stated he wanted to try the nasal mask instead of the full face mask.  Pt tolerating the CPAP and  No complications were noted.  RT will continue to monitor.

## 2013-06-09 NOTE — Progress Notes (Signed)
Patient received at 1350 via bed IVF NS with 20 KCL infusing at 75cc/hr . Patient aphasic right side flaccid . Abrasions scabbed  noted to left elbow and left and  right knee . Oriented patient to room and call bell system . Safety protocol reviewed with patient and visitor . Bed alarm activated side rails x 3 up . Patient noded head that he understood admission . IVF disconnected . Condom cath intact draining yellow urine . Continue with plan of care .        Cleotilde Neer

## 2013-06-09 NOTE — Discharge Planning (Signed)
Physician Discharge Summary  Adam Callahan ZOX:[id number] DOB: 12-29-1960 DOA: 06/06/2013  PCP: No PCP Per Patient  Admit date: 06/06/2013 Discharge date: 06/09/2013  Time spent: 45 minutes  Recommendations for Outpatient Follow-up:  Patient will be discharged to inpatient rehabilitation. He should continue physical therapy as set forth by inpatient rehabilitation. He should continue medications as prescribed.  Discharge Diagnoses:  Active Problems:   CVA (cerebral infarction)   Diabetes mellitus type 2, uncontrolled   Rhabdomyolysis   Dehydration   Hypokalemia   Dyslipidemia   Discharge Condition:  Stable  Diet recommendation: Dysphagia 1  Filed Weights   06/06/13 2203 06/08/13 0427  Weight: 77.5 kg (170 lb 13.7 oz) 77.5 kg (170 lb 13.7 oz)    History of present illness:  Adam Callahan is a 52 y.o. male with history of diabetes mellitus was brought to the ER after patient's family found him on the floor at his house this evening. Patient was last seen normal on Thursday 3 days ago. And patient's daughter today had come to his house and found him on the floor and EMS was called and patient was found to be having expressive aphasia with right-sided hemiplegia. CT of the head done shows multiple subacute nonhemorrhagic infarcts mild midline shift and mass effect. Neurologist on-call Dr. Amada Jupiter has already evaluated the patient and patient has been admitted for further workup. Patient on my exam is still able to follow commands. But able to not give much history due to aphasia. Patient has mild pain in his lower extremities. As per the family he had a fall last month.   Hospital Course:  This 52 year old male with a history of diabetes mellitus was brought to emergency department by his family who found him on the floor. Patient was noted to have expressive aphasia with right-sided he hemiplegia on admission. CT of the head did show multiple subacute nonhemorrhagic  infarcts with mild midline shift and mass effect. Neurology was consulted. 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 Patient was placed on Plavix for secondary stroke prevention. Patient was also placed on a statin 22 and LDL of 144. Patient seen by physical therapy as well as occupational therapy who recommended inpatient rehabilitation. Of note patient had posttraumatic fall in October 2014 for which his vertebral artery dissection may have been attributed to.  Echocardiogram as well as carotid Doppler conducted results are stated below. Patient will need to likely have patient followup with neurology as well as vascular surgery. This should be set up after patient is released from inpatient rehabilitation. He should continue physical therapy as well as occupational therapy with speech therapy as outlined by inpatient rehabilitation specialists.  Procedures: Carotid Dopplers Bilateral carotid artery duplex: 1-39% ICA stenosis. Vertebral artery flow is antegrade.   2-D echocardiogram  - Left ventricle: The cavity size was normal. There was mild concentric hypertrophy. Systolic function was normal. The estimated ejection fraction was in the range of 55% to 60%. Although no diagnostic regional wall motion abnormality was identified, this possibility cannot be completely excluded on the basis of this study. Doppler parameters are consistent with abnormal left ventricular relaxation (grade 1 diastolic dysfunction). - Pulmonary arteries: Systolic pressure was mildly increased. PA peak pressure: 34mm Hg (S).  Impressions: No cardiac source of emboli was indentified   Consultations: Neurology  Discharge Exam: Filed Vitals:   06/09/13 1013  BP: 156/76  Pulse: 86  Temp: 98.2 F (36.8 C)  Resp: 18  General: Well developed, well nourished, NAD, appears stated age  HEENT: NCAT, PERRLA, EOMI, Anicteic Sclera, mucous membranes moist. No pharyngeal erythema or  exudates  Neck: Supple, no JVD, no masses  Cardiovascular: S1 S2 auscultated, no rubs, murmurs or gallops. Regular rate and rhythm.  Respiratory: Clear to auscultation bilaterally with equal chest rise  Abdomen: Soft, nontender, nondistended, + bowel sounds  Extremities: warm dry without cyanosis clubbing or edema  Neuro: AAOx3, cranial nerves grossly intact. Strength 5/5 in LUE and LLE.  Strength 2/5 in RUE and RLE.  Skin: Without rashes exudates or nodules  Psych: Normal affect and demeanor with intact judgement and insight  Discharge Instructions  Discharge Orders   Future Orders Complete By Expires   Diet - low sodium heart healthy  As directed    Discharge instructions  As directed    Comments:     Patient will be discharged to inpatient rehabilitation. He should continue physical therapy as set forth by inpatient rehabilitation. He should continue medications as prescribed.   Increase activity slowly  As directed        Medication List    STOP taking these medications       diazepam 5 MG tablet  Commonly known as:  VALIUM     oxyCODONE-acetaminophen 5-325 MG per tablet  Commonly known as:  PERCOCET/ROXICET      TAKE these medications       acetaminophen 500 MG tablet  Commonly known as:  TYLENOL  Take 2 tablets (1,000 mg total) by mouth every 6 (six) hours as needed for mild pain, moderate pain or fever.     aspirin 81 MG chewable tablet  Chew 1 tablet (81 mg total) by mouth daily.     atorvastatin 20 MG tablet  Commonly known as:  LIPITOR  Take 1 tablet (20 mg total) by mouth daily at 6 PM.     clopidogrel 75 MG tablet  Commonly known as:  PLAVIX  Take 1 tablet (75 mg total) by mouth daily with breakfast.     insulin aspart 100 UNIT/ML injection  Commonly known as:  novoLOG  Inject 0-9 Units into the skin 3 (three) times daily with meals.     insulin glargine 100 UNIT/ML injection  Commonly known as:  LANTUS  Inject 0.05 mLs (5 Units total) into  the skin daily.       No Known Allergies     Follow-up Information   Follow up with Inpatient Rehab.       The results of significant diagnostics from this hospitalization (including imaging, microbiology, ancillary and laboratory) are listed below for reference.    Significant Diagnostic Studies: Ct Head Wo Contrast  06/06/2013   CLINICAL DATA:  Stroke.  Right-sided paralysis.  A phasic.  EXAM: CT HEAD WITHOUT CONTRAST  TECHNIQUE: Contiguous axial images were obtained from the base of the skull through the vertex without intravenous contrast.  COMPARISON:  05/06/2013  FINDINGS: There is an extensive nonhemorrhagic infarction of the entire left posterior cerebral artery distribution. In addition, there is a nonhemorrhagic infarction in the left posterior inferior cerebellar artery distribution. There is edema associated with these infarcts. There is also a nonhemorrhagic infarction in the left thalamus extending into the left cerebral peduncle.  There is compression of the occipital and temporal horns of the left lateral ventricle as well as effacement of cortical sulci in the left temporal and occipital regions.  Osseous structures are normal.  There is a 3 mm midline shift from  left-to-right.  IMPRESSION: Multiple subacute nonhemorrhagic infarctions including the left cerebellum, left occipital lobe, left temporal lobe, and left thalamus and left cerebral peduncle.  Slight midline shift and mass effect.  No hemorrhage.   Electronically Signed   By: Geanie Cooley M.D.   On: 06/06/2013 19:14   Mr Maxine Glenn Head Wo Contrast  06/09/2013   CLINICAL DATA: Right-sided weakness. Stroke. Fall 1 month ago.  EXAM: MRI HEAD WITHOUT AND WITH CONTRAST AND MRA HEAD WITHOUT AND WITH CONTRAST AND MRI NECK WITHOUT AND WITH CONTRAST  TECHNIQUE: Multiplanar, multiecho pulse sequences of the brain and surrounding structures were obtained without and with intravenous contrast. Angiographic images of the head were  obtained using MRA technique without and with contrast. Multiplanar, multiecho pulse sequences of the neck and surrounding structures were obtained without and with intravenous contrast.  CONTRAST 20mL MULTIHANCE GADOBENATE DIMEGLUMINE 529 MG/ML IV SOLN  COMPARISON: CT head 06/06/2013 and 05/06/2013.  FINDINGS: MRI HEAD FINDINGS  Acute infarct left PCA territory. There is acute infarction involving the left mid and posterior temporal lobe extending into the left occipital lobe. Acute infarct also present in the left thalamus and left midbrain. Acute infarct in the left inferior cerebellum. Acute infarct in the splenium of the corpus callosum. Tiny acute infarct right posterior inferior cerebellar artery territory.  Gradient echo images reveals a mild amount of hemorrhage in the left PICA infarct. There is also evidence of clot in the left posterior cerebral artery consistent with an embolus.  Postcontrast imaging reveals mild enhancement in the left PICA infarct. No underlying mass lesion is identified.  Ventricle size is normal. 7 mm midline shift toward the right. Scattered small white matter hyperintensities bilaterally consistent with chronic microvascular ischemic change.  MRA HEAD FINDINGS  Both vertebral arteries are patent to the basilar. Right vertebral artery is dominant.  Left posterior inferior cerebellar artery not identified and appears occluded. There is stenosis in the right posterior inferior cerebellar artery which could be embolus or atherosclerotic disease. The basilar is patent. AICA is patent bilaterally, left AICA is dominant. Bilateral superior cerebellar arteries are patent.  Irregularity in the right posterior cerebral artery consistent with either embolus or atherosclerotic disease. Occluded left posterior cerebral artery consistent with an embolus causing stroke.  Cavernous carotid is patent bilaterally. Anterior cerebral artery patent bilaterally. Right middle cerebral artery is widely  patent.  Focal moderate to severe stenosis in the proximal aspect of the temporal branch of the left middle cerebral artery.  Negative for cerebral aneurysm.  MRI NECK FINDINGS  Two vessel arch. Left common carotid artery over arises from the innominate artery. Proximal great vessels are patent.  Right carotid: Right common carotid artery widely patent. Atherosclerotic type plaque in the carotid bulb narrowing the lumen by approximately 25% diameter stenosis. Cervical carotid is patent.  Left carotid: Left common carotid artery widely patent. Left carotid bifurcation is widely patent and the left cervical carotid is patent.  Vertebral arteries: Right vertebral artery is dominant. There is a mild stenosis the distal right vertebral artery. There is irregularity in the mid left vertebral artery which could be due to dissection or atherosclerotic disease. Given the history of fall, and acute infarct, dissection is the most likely cause of this finding.  IMPRESSION: Acute infarct left posterior cerebral artery territory involving the left inferior cerebellum, and left temporal and occipital lobe as well as the left thalamus. There appears to be in embolus the left posterior cerebral artery. Mild amount of hemorrhage in the  left posterior inferior cerebellar artery infarct.  Occlusions of the left posterior cerebral artery and left posterior inferior cerebellar artery compatible with acute infarct and probable embolism.  Moderate to severe focal stenosis in the proximal aspect of the temporal branch of the left middle cerebral artery.  Segmental irregularity left vertebral artery most likely a cause for embolus causing acute infarction. Favor dissection given the recent history of a fall. Atherosclerotic disease also possible.  Critical Value/emergent results were called by telephone at the time of interpretation on 06/07/2013 at 2:00 PM to Dr. Noel Christmas , who verbally acknowledged these results.   Electronically  Signed   By: Marlan Palau M.D.   On: 06/09/2013 09:35   Mr Angiogram Neck W Wo Contrast  06/07/2013   CLINICAL DATA:  Right-sided weakness.  Stroke.  Fall 1 month ago.  EXAM: MRI HEAD WITHOUT AND WITH CONTRAST AND MRA HEAD WITHOUT AND WITH CONTRAST AND MRI NECK WITHOUT AND WITH CONTRAST  TECHNIQUE: Multiplanar, multiecho pulse sequences of the brain and surrounding structures were obtained without and with intravenous contrast. Angiographic images of the head were obtained using MRA technique without and with contrast. Multiplanar, multiecho pulse sequences of the neck and surrounding structures were obtained without and with intravenous contrast.  CONTRAST:  20mL MULTIHANCE GADOBENATE DIMEGLUMINE 529 MG/ML IV SOLN  COMPARISON:  CT head 06/06/2013 and 05/06/2013.  FINDINGS: MRI HEAD FINDINGS  Acute infarct left PCA territory. There is acute infarction involving the left mid and posterior temporal lobe extending into the left occipital lobe. Acute infarct also present in the left thalamus and left midbrain. Acute infarct in the left inferior cerebellum. Acute infarct in the splenium of the corpus callosum. Tiny acute infarct right posterior inferior cerebellar artery territory.  Gradient echo images reveals a mild amount of hemorrhage in the left PICA infarct. There is also evidence of clot in the left posterior cerebral artery consistent with an embolus.  Postcontrast imaging reveals mild enhancement in the left PICA infarct. No underlying mass lesion is identified.  Ventricle size is normal. 7 mm midline shift toward the right. Scattered small white matter hyperintensities bilaterally consistent with chronic microvascular ischemic change.  MRA HEAD FINDINGS  Both vertebral arteries are patent to the basilar. Right vertebral artery is dominant.  Left posterior inferior cerebellar artery not identified and appears occluded. There is stenosis in the right posterior inferior cerebellar artery which could be  embolus or atherosclerotic disease. The basilar is patent. AICA is patent bilaterally, left AICA is dominant. Bilateral superior cerebellar arteries are patent.  Irregularity in the right posterior cerebral artery consistent with either embolus or atherosclerotic disease. Occluded left posterior cerebral artery consistent with an embolus causing stroke.  Cavernous carotid is patent bilaterally. Anterior cerebral artery patent bilaterally. Right middle cerebral artery is widely patent.  Focal moderate to severe stenosis in the proximal aspect of the temporal branch of the left middle cerebral artery.  Negative for cerebral aneurysm.  MRI NECK FINDINGS  Two vessel arch. Left common carotid artery over arises from the innominate artery. Proximal great vessels are patent.  Right carotid: Right common carotid artery widely patent. Atherosclerotic type plaque in the carotid bulb narrowing the lumen by approximately 25% diameter stenosis. Cervical carotid is patent.  Left carotid: Left common carotid artery widely patent. Left carotid bifurcation is widely patent and the left cervical carotid is patent.  Vertebral arteries: Right vertebral artery is dominant. There is a mild stenosis the distal right vertebral artery. There is irregularity  in the mid left vertebral artery which could be due to dissection or atherosclerotic disease. Given the history of fall, and acute infarct, dissection is the most likely cause of this finding.  IMPRESSION: Acute infarct left posterior cerebral artery territory involving the left inferior cerebellum, and left temporal and occipital lobe as well as the left thalamus. There appears to be in embolus the left posterior cerebral artery. Mild amount of hemorrhage in the left posterior inferior cerebellar artery infarct.  Occlusions of the left posterior cerebral artery and left posterior inferior cerebellar artery compatible with acute infarct and probable embolism.  Moderate to severe focal  stenosis in the proximal aspect of the temporal branch of the left middle cerebral artery.  Segmental irregularity left vertebral artery most likely a cause for embolus causing acute infarction. Favor dissection given the recent history of a fall. Atherosclerotic disease also possible.  Critical Value/emergent results were called by telephone at the time of interpretation on 06/07/2013 at 2:00 PM to Dr.Charles Roseanne Reno , who verbally acknowledged these results.   Electronically Signed   By: Marlan Palau M.D.   On: 06/07/2013 14:03   Mr Laqueta Jean ZO Contrast  06/09/2013   CLINICAL DATA: Right-sided weakness. Stroke. Fall 1 month ago.  EXAM: MRI HEAD WITHOUT AND WITH CONTRAST AND MRA HEAD WITHOUT AND WITH CONTRAST AND MRI NECK WITHOUT AND WITH CONTRAST  TECHNIQUE: Multiplanar, multiecho pulse sequences of the brain and surrounding structures were obtained without and with intravenous contrast. Angiographic images of the head were obtained using MRA technique without and with contrast. Multiplanar, multiecho pulse sequences of the neck and surrounding structures were obtained without and with intravenous contrast.  CONTRAST 20mL MULTIHANCE GADOBENATE DIMEGLUMINE 529 MG/ML IV SOLN  COMPARISON: CT head 06/06/2013 and 05/06/2013.  FINDINGS: MRI HEAD FINDINGS  Acute infarct left PCA territory. There is acute infarction involving the left mid and posterior temporal lobe extending into the left occipital lobe. Acute infarct also present in the left thalamus and left midbrain. Acute infarct in the left inferior cerebellum. Acute infarct in the splenium of the corpus callosum. Tiny acute infarct right posterior inferior cerebellar artery territory.  Gradient echo images reveals a mild amount of hemorrhage in the left PICA infarct. There is also evidence of clot in the left posterior cerebral artery consistent with an embolus.  Postcontrast imaging reveals mild enhancement in the left PICA infarct. No underlying mass lesion  is identified.  Ventricle size is normal. 7 mm midline shift toward the right. Scattered small white matter hyperintensities bilaterally consistent with chronic microvascular ischemic change.  MRA HEAD FINDINGS  Both vertebral arteries are patent to the basilar. Right vertebral artery is dominant.  Left posterior inferior cerebellar artery not identified and appears occluded. There is stenosis in the right posterior inferior cerebellar artery which could be embolus or atherosclerotic disease. The basilar is patent. AICA is patent bilaterally, left AICA is dominant. Bilateral superior cerebellar arteries are patent.  Irregularity in the right posterior cerebral artery consistent with either embolus or atherosclerotic disease. Occluded left posterior cerebral artery consistent with an embolus causing stroke.  Cavernous carotid is patent bilaterally. Anterior cerebral artery patent bilaterally. Right middle cerebral artery is widely patent.  Focal moderate to severe stenosis in the proximal aspect of the temporal branch of the left middle cerebral artery.  Negative for cerebral aneurysm.  MRI NECK FINDINGS  Two vessel arch. Left common carotid artery over arises from the innominate artery. Proximal great vessels are patent.  Right carotid:  Right common carotid artery widely patent. Atherosclerotic type plaque in the carotid bulb narrowing the lumen by approximately 25% diameter stenosis. Cervical carotid is patent.  Left carotid: Left common carotid artery widely patent. Left carotid bifurcation is widely patent and the left cervical carotid is patent.  Vertebral arteries: Right vertebral artery is dominant. There is a mild stenosis the distal right vertebral artery. There is irregularity in the mid left vertebral artery which could be due to dissection or atherosclerotic disease. Given the history of fall, and acute infarct, dissection is the most likely cause of this finding.  IMPRESSION: Acute infarct left posterior  cerebral artery territory involving the left inferior cerebellum, and left temporal and occipital lobe as well as the left thalamus. There appears to be in embolus the left posterior cerebral artery. Mild amount of hemorrhage in the left posterior inferior cerebellar artery infarct.  Occlusions of the left posterior cerebral artery and left posterior inferior cerebellar artery compatible with acute infarct and probable embolism.  Moderate to severe focal stenosis in the proximal aspect of the temporal branch of the left middle cerebral artery.  Segmental irregularity left vertebral artery most likely a cause for embolus causing acute infarction. Favor dissection given the recent history of a fall. Atherosclerotic disease also possible.  Critical Value/emergent results were called by telephone at the time of interpretation on 06/07/2013 at 2:00 PM to Dr. Noel Christmas , who verbally acknowledged these results.   Electronically Signed   By: Marlan Palau M.D.   On: 06/09/2013 09:34    Microbiology: Recent Results (from the past 240 hour(s))  MRSA PCR SCREENING     Status: None   Collection Time    06/06/13 10:29 PM      Result Value Range Status   MRSA by PCR NEGATIVE  NEGATIVE Final   Comment:            The GeneXpert MRSA Assay (FDA     approved for NASAL specimens     only), is one component of a     comprehensive MRSA colonization     surveillance program. It is not     intended to diagnose MRSA     infection nor to guide or     monitor treatment for     MRSA infections.     Labs: Basic Metabolic Panel:  Recent Labs Lab 06/06/13 1806 06/06/13 1837 06/07/13 0525 06/08/13 0620  NA 137 140 141 136  K 3.6 3.3* 3.3* 3.5  CL 97 101 103 102  CO2 27  --  27 23  GLUCOSE 265* 272* 187* 134*  BUN 20 21 17 12   CREATININE 0.69 1.00 0.83 0.63  CALCIUM 9.2  --  8.7 8.3*   Liver Function Tests:  Recent Labs Lab 06/06/13 1806 06/07/13 0525  AST 26 21  ALT 22 18  ALKPHOS 71 63   BILITOT 0.8 0.7  PROT 8.1 7.4  ALBUMIN 3.7 3.2*   No results found for this basename: LIPASE, AMYLASE,  in the last 168 hours No results found for this basename: AMMONIA,  in the last 168 hours CBC:  Recent Labs Lab 06/06/13 1806 06/06/13 1837 06/07/13 0525 06/08/13 0620  WBC 11.5*  --  9.4 7.7  NEUTROABS 8.3*  --  6.0  --   HGB 15.9 16.0 13.4 12.4*  HCT 43.8 47.0 40.2 35.4*  MCV 84.2  --  84.3 83.9  PLT 301  --  293 277   Cardiac Enzymes:  Recent Labs  Lab 06/06/13 1806 06/06/13 2252 06/07/13 0525  CKTOTAL 1086*  --  754*  TROPONINI <0.30 <0.30  --    BNP: BNP (last 3 results) No results found for this basename: PROBNP,  in the last 8760 hours CBG:  Recent Labs Lab 06/08/13 0747 06/08/13 1235 06/08/13 1701 06/08/13 2052 06/09/13 0713  GLUCAP 135* 221* 229* 234* 182*       Signed:  Ezmae Speers  [facility] 06/09/2013, 10:35 AM

## 2013-06-09 NOTE — Progress Notes (Signed)
Nutrition Brief Note  Patient identified on the Malnutrition Screening Tool (MST) Report for recent weight lost without trying and eating poorly because of a decreased appetite.  Per weight readings, patient has had 8% weight loss in ~ 10 months; not significant for time frame.  Wt Readings from Last 15 Encounters:  06/08/13 170 lb 13.7 oz (77.5 kg)  05/06/13 174 lb 14.4 oz (79.334 kg)  07/23/12 185 lb (83.915 kg)  08/05/11 195 lb (88.451 kg)    Body mass index is 28.43 kg/(m^2). Patient meets criteria for Overweight based on current BMI.   Current diet order is Dysphagia 1-thin liquid, patient is consuming approximately 100% of meals at this time. Labs and medications reviewed.   No nutrition interventions warranted at this time. If nutrition issues arise, please consult RD.   Maureen Chatters, RD, LDN Pager #: [phone] After-Hours Pager #: [phone]

## 2013-06-09 NOTE — Progress Notes (Signed)
I am admitting pt to inpt rehab today. I have contacted the attending and they are aware. [phone]

## 2013-06-10 ENCOUNTER — Inpatient Hospital Stay (HOSPITAL_COMMUNITY): Payer: Self-pay

## 2013-06-10 ENCOUNTER — Inpatient Hospital Stay (HOSPITAL_COMMUNITY): Payer: BC Managed Care – PPO

## 2013-06-10 DIAGNOSIS — G8110 Spastic hemiplegia affecting unspecified side: Secondary | ICD-10-CM

## 2013-06-10 DIAGNOSIS — I69991 Dysphagia following unspecified cerebrovascular disease: Secondary | ICD-10-CM

## 2013-06-10 DIAGNOSIS — I6330 Cerebral infarction due to thrombosis of unspecified cerebral artery: Secondary | ICD-10-CM

## 2013-06-10 LAB — COMPREHENSIVE METABOLIC PANEL
CO2: 26 mEq/L (ref 19–32)
Calcium: 8.9 mg/dL (ref 8.4–10.5)
Creatinine, Ser: 0.55 mg/dL (ref 0.50–1.35)
GFR calc Af Amer: >90 mL/min (ref >90)
GFR calc non Af Amer: >90 mL/min (ref >90)
Glucose, Bld: 214 mg/dL — ABNORMAL HIGH (ref 70–99)
Sodium: 136 mEq/L (ref 135–145)

## 2013-06-10 LAB — GLUCOSE, CAPILLARY: Glucose-Capillary: 227 mg/dL — ABNORMAL HIGH (ref 70–99)

## 2013-06-10 LAB — CBC WITH DIFFERENTIAL/PLATELET
Basophils Relative: 0 % (ref 0–1)
Eosinophils Relative: 3 % (ref 0–5)
HCT: 37.8 % — ABNORMAL LOW (ref 39.0–52.0)
Lymphocytes Relative: 31 % (ref 12–46)
Lymphs Abs: 2.2 10*3/uL (ref 0.7–4.0)
MCH: 29 pg (ref 26.0–34.0)
MCHC: 35.2 g/dL (ref 30.0–36.0)
MCV: 82.5 fL (ref 78.0–100.0)
Monocytes Absolute: 0.7 10*3/uL (ref 0.1–1.0)
Neutrophils Relative %: 57 % (ref 43–77)
RBC: 4.58 MIL/uL (ref 4.22–5.81)
RDW: 11.9 % (ref 11.5–15.5)
WBC: 7.1 10*3/uL (ref 4.0–10.5)

## 2013-06-10 MED ORDER — ADULT MULTIVITAMIN W/MINERALS CH
1.0000 | ORAL_TABLET | Freq: Every day | ORAL | Status: DC
  Administered 2013-06-10 – 2013-06-29 (×20): 1 via ORAL
  Filled 2013-06-10 (×22): qty 1

## 2013-06-10 MED ORDER — GLUCERNA SHAKE PO LIQD
237.0000 mL | Freq: Every day | ORAL | Status: DC
  Administered 2013-06-10 – 2013-06-29 (×19): 237 mL via ORAL

## 2013-06-10 MED ORDER — LIVING WELL WITH DIABETES BOOK
Freq: Once | Status: AC
  Administered 2013-06-10: 18:00:00
  Filled 2013-06-10: qty 1

## 2013-06-10 NOTE — Progress Notes (Signed)
Patient information reviewed and entered into eRehab system by Lismary Kiehn, RN, CRRN, PPS Coordinator.  Information including medical coding and functional independence measure will be reviewed and updated through discharge.    

## 2013-06-10 NOTE — Progress Notes (Signed)
Occupational Therapy Assessment and Plan  Patient Details  Name: Adam Callahan MRN: [id number] Date of Birth: [birth year]  OT Diagnosis: abnormal posture, cognitive deficits, disturbance of vision, hemiplegia affecting dominant side and muscle weakness (generalized) Rehab Potential: Rehab Potential: Excellent ELOS: 18-20 days   Today's Date: 06/10/2013 Time: 1030-1130 Time Calculation (min): 60 min  Problem List:  Patient Active Problem List   Diagnosis Date Noted  . 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 Dehydration 06/07/2013  . Hypokalemia 06/07/2013  . Dyslipidemia 06/07/2013  . CVA (cerebral infarction) 06/06/2013  . Diabetes mellitus type 2, uncontrolled 06/06/2013  . Rhabdomyolysis 06/06/2013    Past Medical History:  Past Medical History  Diagnosis Date  . Diabetes mellitus    Past Surgical History: No past surgical history on file.  Assessment & Plan Clinical Impression: Patient is a 52 y.o. year old male right-handed male with history of diabetes mellitus with peripheral neuropathy. Patient was independent prior to admission driving . Admitted 06/06/2013 with acute right-sided weakness and aphasia after being found on the floor by his family. MRI of the brain acute infarct left PCA territory. There is acute infarct involving the left mid and posterior temporal lobe extending into the left occipital lobe as well acute infarct present left thalamus and left midbrain, left inferior cerebellum. MRA of the head negative for aneurysm. MRI of the neck with occlusions versus embolus versus dissection of the left posterior cerebral artery and left posterior inferior cerebellar artery compatible with acute infarct. Echocardiogram with ejection fraction of 55% no wall motion abnormalities and grade 1 diastolic dysfunction. Carotid Dopplers with no ICA stenosis. Patient did not receive TPA. Neurology services consulted presently on aspirin and Plavix for CVA prophylaxis as well  as subcutaneous Lovenox for DVT prophylaxis. Presently maintained on a dysphagia 1 thin liquid diet. Hemoglobin A1c 10.9 with insulin therapy as directed. Physical and occupational therapy evaluation completed 06/07/2013 with recommendations of physical medicine rehabilitation consult to consider inpatient rehabilitation services. Patient was felt to be a good candidate for inpatient rehabilitation services was admitted for comprehensive rehabilitation program. Patient transferred to CIR on 06/09/2013 .    Patient currently requires max with basic self-care skills secondary to impaired timing and sequencing, abnormal tone, unbalanced muscle activation, decreased coordination and decreased motor planning, decreased visual perceptual skills, decreased midline orientation and decreased attention to right and decreased attention, decreased awareness, decreased problem solving and decreased safety awareness.  Prior to hospitalization, patient could complete BADLs with independent .  Patient will benefit from skilled intervention to decrease level of assist with basic self-care skills and increase independence with basic self-care skills prior to discharge home with family.  Anticipate patient will require minimal physical assistance and follow up home health.  OT - End of Session Activity Tolerance: Decreased this session Endurance Deficit: Yes OT Assessment Rehab Potential: Excellent OT Patient demonstrates impairments in the following area(s): Balance;Behavior;Cognition;Endurance;Motor;Pain;Perception;Safety;Sensory;Vision OT Basic ADL's Functional Problem(s): Eating;Grooming;Bathing;Dressing;Toileting OT Transfers Functional Problem(s): Toilet;Tub/Shower OT Additional Impairment(s): Fuctional Use of Upper Extremity OT Plan OT Intensity: Minimum of 1-2 x/day, 45 to 90 minutes OT Frequency: 5 out of 7 days OT Duration/Estimated Length of Stay: 18-20 days OT Treatment/Interventions: Balance/vestibular  training;Cognitive remediation/compensation;Community reintegration;Discharge planning;Functional electrical stimulation;Functional mobility training;Neuromuscular re-education;Pain management;Psychosocial support;Patient/family education;Self Care/advanced ADL retraining;Therapeutic Activities;Therapeutic Exercise;UE/LE Strength taining/ROM;UE/LE Coordination activities;Visual/perceptual remediation/compensation OT Self Feeding Anticipated Outcome(s): supervision OT Basic Self-Care Anticipated Outcome(s): min assist overall  OT Toileting Anticipated Outcome(s): mod assist  OT Bathroom Transfers Anticipated Outcome(s): min assist overall  OT  Recommendation Patient destination: Home Follow Up Recommendations: Home health OT;Outpatient OT Equipment Recommended: Tub/shower bench;3 in 1 bedside comode   Skilled Therapeutic Intervention Pt received supine in bed with aunt's goddaughter present. Pt agreeable to therapy session at this time. Completed bathing in dressing from w/c at sink with therapist presenting all items from right side and standing on right side to increase attention as pt has left gaze preference. Practiced functional transfers with max assist. Pt required max assist for sit<>stands demonstrating lateral lean to R. Pt did attend to RUE and RLE during bathing tasks. Therapists provided [facility] assist during bathing task as well.  Discussed pt's deficits and CLOF with family member present and she was very involved.   OT Evaluation Precautions/Restrictions  Precautions Precautions: Fall Precaution Comments: right inattention Restrictions Weight Bearing Restrictions: No General   Vital Signs   Pain Pain Assessment Pain Assessment: No/denies pain Home Living/Prior Functioning Home Living Family/patient expects to be discharged to:: Private residence Living Arrangements: Alone Available Help at Discharge: Family;Friend(s);Available 24 hours/day Type of Home: House Home Access:  Other (comment) (threshold to porch and inside of house) Home Layout: One level  Lives With: Alone IADL History Homemaking Responsibilities: No Current License: Yes Prior Function Level of Independence: Independent with basic ADLs;Independent with homemaking with ambulation;Independent with gait  Able to Take Stairs?: Yes Driving: Yes 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[employer]; also pastor ADL   Vision/Perception  Vision - History Baseline Vision: No visual deficits Patient Visual Report: Blurring of vision (pt inconsistent with yes/no) Vision - Assessment Eye Alignment: Impaired (comment) Alignment/Gaze Preference: Gaze left Tracking/Visual Pursuits: Left eye does not track laterally;Unable to hold eye position out of midline Additional Comments: unable to separate eye movements from head turns  Cognition Overall Cognitive Status: Impaired/Different from baseline Arousal/Alertness: Awake/alert Orientation Level: Oriented to person;Disoriented to place;Disoriented to time;Disoriented to situation Attention: Sustained Focused Attention: Impaired Sustained Attention: Impaired Sustained Attention Impairment: Functional basic Memory: Impaired Awareness: Impaired Awareness Impairment: Intellectual impairment Problem Solving: Impaired Problem Solving Impairment: Functional basic Safety/Judgment: Impaired Sensation Sensation Light Touch: Impaired by gross assessment Stereognosis: Not tested Hot/Cold: Not tested Proprioception: Impaired by gross assessment Additional Comments: Pt unaware of light touch to RUE  Coordination Gross Motor Movements are Fluid and Coordinated: No Fine Motor Movements are Fluid and Coordinated: No Motor  Motor Motor: Hemiplegia;Motor perseverations Mobility  Bed Mobility Bed Mobility: Rolling Left;Supine to Sit;Sit to Supine Rolling Left: 3: Mod assist Left Sidelying to Sit: 4: Min assist;With  rails;HOB flat Supine to Sit: 3: Mod assist Sitting - Scoot to Edge of Bed: 3: Mod assist Sit to Supine: 4: Min assist Transfers Sit to Stand: 2: Max assist;With upper extremity assist;From bed Stand to Sit: 2: Max assist;With upper extremity assist  Trunk/Postural Assessment  Cervical Assessment Cervical Assessment: Exceptions to [facility] Cervical Strength Overall Cervical Strength Comments: patient tends to keep head flexed and rotated to left Thoracic Assessment Thoracic Assessment: Within Functional Limits Lumbar Assessment Lumbar Assessment: Within Functional Limits Postural Control Postural Control: Deficits on evaluation Trunk Control: patient tends to keep trunk flexed with posterior pelvic tilt. Patient sits to left of midline and can correct with cueing.   Balance Static Sitting Balance Static Sitting - Balance Support: Left upper extremity supported;Feet supported Static Sitting - Level of Assistance: 5: Stand by assistance Static Standing Balance Static Standing - Balance Support: Left upper extremity supported Static Standing - Level of Assistance: 1: +1 Total assist Extremity/Trunk Assessment RUE  Assessment RUE Assessment: Exceptions to [facility] RUE Strength RUE Overall Strength Comments: no active movement in RUE and tolerated PROM with min pain via faical grimacing during shoulder flexion  LUE Assessment LUE Assessment: Within Functional Limits  FIM:  FIM - Eating Eating Activity: 1: Helper feeds patient FIM - Grooming Grooming Steps: Wash, rinse, dry face Grooming: 2: Patient completes 1 of 4 or 2 of 5 steps FIM - Bathing Bathing Steps Patient Completed: Chest;Abdomen;Front perineal area;Right upper leg;Left upper leg;Right Arm Bathing: 3: Mod-Patient completes 5-7 6f 10 parts or 50-74% FIM - Upper Body Dressing/Undressing Upper body dressing/undressing: 0: Wears gown/pajamas-no public clothing FIM - Lower Body Dressing/Undressing Lower body dressing/undressing:  1: Total-Patient completed less than 25% of tasks FIM - Bed/Chair Transfer Bed/Chair Transfer: 3: Supine > Sit: Mod A (lifting assist/Pt. 50-74%/lift 2 legs;2: Bed > Chair or W/C: Max A (lift and lower assist) FIM - Diplomatic Services operational officer Devices: Grab bars Toilet Transfers: 2-To toilet/BSC: Max A (lift and lower assist);2-From toilet/BSC: Max A (lift and lower assist)   Refer to Care Plan for Long Term Goals  Recommendations for other services: None  Discharge Criteria: Patient will be discharged from OT if patient refuses treatment 3 consecutive times without medical reason, if treatment goals not met, if there is a change in medical status, if patient makes no progress towards goals or if patient is discharged from hospital.  The above assessment, treatment plan, treatment alternatives and goals were discussed and mutually agreed upon: by patient  Daneil Dan 06/10/2013, 11:50 AM

## 2013-06-10 NOTE — Progress Notes (Signed)
INITIAL NUTRITION ASSESSMENT  DOCUMENTATION CODES Per approved criteria  -Not Applicable   INTERVENTION: Add Glucerna Shake po daily, each supplement provides 220 kcal and 10 grams of protein. Add MVI daily. RD to continue to follow nutrition care plan.  NUTRITION DIAGNOSIS: Increased nutrient needs related to participation in therapies as evidenced by estimated needs.   Goal: Intake to meet >90% of estimated nutrition needs.  Monitor:  weight trends, lab trends, I/O's, PO intake, supplement tolerance  Reason for Assessment: Low Braden + Malnutrition Screening Tool  52 y.o. male  Admitting Dx: Embolic cerebral infarction  ASSESSMENT: PMHx significant for DM (A1c 10.9%) with peripheral neuropathy. Admitted 11/16 with acute infarct of left PCA territory. Currently maintained on a Dysphagia 1 diet with thin liquids.  Pt is currently consuming 30-100% of Dysphagia 1 meals. Discussed oral intake with patient and family. Pt reports that his appetite is good and his weight has been stable. Checks his blood sugars at home. Is agreeable to Glucerna Shakes daily to help maintain weight.  Blood sugars elevated, ranging from 214 - 294.   Height: Ht Readings from Last 1 Encounters:  06/09/13 5\' 10"  (1.778 m)    Weight: Wt Readings from Last 1 Encounters:  06/09/13 181 lb 14.1 oz (82.5 kg)    Ideal Body Weight: 166 lb  % Ideal Body Weight: 109%  Wt Readings from Last 10 Encounters:  06/09/13 181 lb 14.1 oz (82.5 kg)  06/08/13 170 lb 13.7 oz (77.5 kg)  05/06/13 174 lb 14.4 oz (79.334 kg)  07/23/12 185 lb (83.915 kg)  08/05/11 195 lb (88.451 kg)    Usual Body Weight: 185 lb  % Usual Body Weight: 98%  BMI:  Body mass index is 26.1 kg/(m^2). Overweight  Estimated Nutritional Needs: Kcal: 1750 - 2000 Protein: 80 - 95 g Fluid: 1.7 - 2 liters daily  Skin:  Abrasion to R knee  Diet Order: Dysphagia 1 with thin liquids  EDUCATION NEEDS: -No education needs identified  at this time   Intake/Output Summary (Last 24 hours) at 06/10/13 1119 Last data filed at 06/10/13 0921  Gross per 24 hour  Intake    480 ml  Output   2300 ml  Net  -1820 ml    Last BM: 11/16  Labs:   Recent Labs Lab 06/07/13 0525 06/08/13 0620 06/09/13 1740 06/10/13 0530  NA 141 136  --  136  K 3.3* 3.5  --  3.9  CL 103 102  --  99  CO2 27 23  --  26  BUN 17 12  --  7  CREATININE 0.83 0.63 0.63 0.55  CALCIUM 8.7 8.3*  --  8.9  GLUCOSE 187* 134*  --  214*    CBG (last 3)   Recent Labs  06/09/13 1612 06/09/13 2058 06/10/13 0727  GLUCAP 272* 294* 227*   Lab Results  Component Value Date   HGBA1C 10.9* 06/07/2013    Scheduled Meds: . atorvastatin  20 mg Oral q1800  . clopidogrel  75 mg Oral Q breakfast  . enoxaparin (LOVENOX) injection  40 mg Subcutaneous Daily  . insulin aspart  0-9 Units Subcutaneous TID WC  . insulin glargine  5 Units Subcutaneous Daily    Continuous Infusions:  none  Past Medical History  Diagnosis Date  . Diabetes mellitus     No past surgical history on file.  Jarold Motto MS, RD, LDN Pager: [phone] After-hours pager: [phone]

## 2013-06-10 NOTE — Evaluation (Signed)
Physical Therapy Assessment and Plan  Patient Details  Name: Adam Callahan MRN: [id number] Date of Birth: 12-Apr-1961  PT Diagnosis: Abnormal posture, Hemiplegia dominant and Impaired sensation Rehab Potential: Good ELOS: 18 - 20 days   Today's Date: 06/10/2013 Time: 0800-0855 Time Calculation (min): 55 min  Problem List:  Patient Active Problem List   Diagnosis Date Noted  . 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 Dehydration 06/07/2013  . Hypokalemia 06/07/2013  . Dyslipidemia 06/07/2013  . CVA (cerebral infarction) 06/06/2013  . Diabetes mellitus type 2, uncontrolled 06/06/2013  . Rhabdomyolysis 06/06/2013    Past Medical History:  Past Medical History  Diagnosis Date  . Diabetes mellitus    Past Surgical History: No past surgical history on file.  Assessment & Plan Clinical Impression: Adam Callahan is a 52 y.o. right-handed male with history of diabetes mellitus with peripheral neuropathy. Patient was independent prior to admission driving . Admitted 06/06/2013 with acute right-sided weakness and aphasia after being found on the floor by his family. MRI of the brain acute infarct left PCA territory. There is acute infarct involving the left mid and posterior temporal lobe extending into the left occipital lobe as well acute infarct present left thalamus and left midbrain, left inferior cerebellum. MRA of the head negative for aneurysm. MRI of the neck with occlusions versus embolus versus dissection of the left posterior cerebral artery and left posterior inferior cerebellar artery compatible with acute infarct. Echocardiogram with ejection fraction of 55% no wall motion abnormalities and grade 1 diastolic dysfunction. Carotid Dopplers with no ICA stenosis. Patient did not receive TPA. Neurology services consulted presently on aspirin and Plavix for CVA prophylaxis as well as subcutaneous Lovenox for DVT prophylaxis. Presently maintained on a dysphagia 1 thin liquid  diet. Hemoglobin A1c 10.9 with insulin therapy as directed. Physical and occupational therapy evaluation completed 06/07/2013 with recommendations of physical medicine rehabilitation consult to consider inpatient rehabilitation services. Patient was felt to be a good candidate for inpatient rehabilitation services was admitted for comprehensive rehabilitation program on 06/09/2013 .   Patient currently requires max with mobility secondary to decreased sitting balance, decreased standing balance, decreased postural control and hemiplegia.  Prior to hospitalization, patient was independent  with mobility and lived with Alone in a House home.  Home access is  Other (comment) (Threshold to porch and into house).  Patient will benefit from skilled PT intervention to maximize safe functional mobility, minimize fall risk and decrease caregiver burden for planned discharge home with 24 hour assist.  Anticipate patient will benefit from follow up [facility] at discharge.  PT - End of Session Activity Tolerance: Tolerates 30+ min activity with multiple rests PT Assessment Rehab Potential: Good Barriers to Discharge: Decreased caregiver support PT Patient demonstrates impairments in the following area(s): Balance;Motor;Sensory PT Transfers Functional Problem(s): Bed Mobility;Bed to Chair;Car PT Locomotion Functional Problem(s): Ambulation;Wheelchair Mobility PT Plan PT Intensity: Minimum of 1-2 x/day ,45 to 90 minutes PT Frequency: 5 out of 7 days PT Duration Estimated Length of Stay: 18 - 20 days PT Treatment/Interventions: Ambulation/gait training;Balance/vestibular training;Discharge planning;Functional mobility training;Neuromuscular re-education;Patient/family education;Therapeutic Activities;Therapeutic Exercise;UE/LE Strength taining/ROM;Wheelchair propulsion/positioning PT Transfers Anticipated Outcome(s): supervision basic transfers; min assist car transfers PT Locomotion Anticipated Outcome(s): mod assist  short distance ambulation in controlled environment; supervision wheelchair mobility in controlled and home environment; mod assist up and down 1 step to enter home PT Recommendation Recommendations for Other Services: Neuropsych consult Follow Up Recommendations: Home health PT;24 hour supervision/assistance Patient destination: Home Equipment Recommended: Wheelchair (measurements);Wheelchair  cushion (measurements);Other (comment) (AD for ambulation TBD)  Skilled Therapeutic Intervention Patient participated in initial evaluation. Patient transferred bed to wheelchair with max assist. Patient pushed in wheelchair to gym. Patient transferred wheelchair to mat with max assist. Patient worked on static sitting balance and midline posture. Patient sit to stand with +1 assist. Patient ambulated up and down 3 steps with 1 rail and +2 assist. Patient required manual assist for hip and knee extension on right and for placement up and down on each step. Patient ambulated in hallway using railing about 4 steps with +2 assist (one to facilitate gait and 1 to follow with wheelchair). Patient tends to adduct right LE when advancing forward and needs total assist with placement and hip and knee extension during stance. Patient instructed in hemi technique for pushing wheelchair and performed 150 feet back to room with mod assist to avoid objects on right due to inattention. Patient was left in wheelchair with NT for breakfast.   PT Evaluation Precautions/Restrictions Precautions Precautions: Fall Precaution Comments: right inattention Restrictions Weight Bearing Restrictions: No General Chart Reviewed: Yes Family/Caregiver Present: Yes Vital SignsTherapy Vitals Temp: 98.4 F (36.9 C) Temp src: Oral Pulse Rate: 83 Resp: 18 BP: 134/77 mmHg Patient Position, if appropriate: Lying Oxygen Therapy SpO2: 100 % O2 Device: None (Room air) Pain Pain Assessment Pain Assessment: No/denies pain Home  Living/Prior Functioning Home Living Available Help at Discharge: Family;Friend(s);Available 24 hours/day Type of Home: House Home Access: Other (comment) (Threshold to porch and into house) Home Layout: One level  Lives With: Alone Prior Function Level of Independence: Independent with gait;Independent with homemaking with ambulation  Able to Take Stairs?: Yes Driving: Yes Vocation: Full time employment Field seismologist) Vision/Perception  Vision - Assessment Eye Alignment: Impaired (comment) Alignment/Gaze Preference: Gaze left  Cognition Overall Cognitive Status: Impaired/Different from baseline Arousal/Alertness: Awake/alert Orientation Level: Oriented to person;Disoriented to place;Disoriented to time;Disoriented to situation;Other (comment) (pt reported day of week as Monday; knew he was in the hospital but said [facility]; did not know why he was here.) Attention: Sustained Sustained Attention: Impaired Sustained Attention Impairment: Functional basic Awareness: Impaired Awareness Impairment: Intellectual impairment Problem Solving: Impaired Problem Solving Impairment: Functional basic Sensation Sensation Light Touch: Impaired by gross assessment Stereognosis: Not tested Hot/Cold: Not tested Proprioception: Impaired by gross assessment Additional Comments: Patient reported that feeling was less on right side - UE more impaired than LE. Patient required cueing to attend to right UE during tranisitional movements. Coordination Gross Motor Movements are Fluid and Coordinated: No Fine Motor Movements are Fluid and Coordinated: No Motor  Motor Motor: Hemiplegia;Motor perseverations  Mobility Bed Mobility Bed Mobility: Rolling Left;Supine to Sit;Sit to Supine Rolling Left: 3: Mod assist Left Sidelying to Sit: 4: Min assist;With rails;HOB flat Supine to Sit: 3: Mod assist Sitting - Scoot to Edge of Bed: 3: Mod assist Sit to Supine: 4: Min assist Transfers Transfers: Yes Sit  to Stand: 2: Max assist;With upper extremity assist;From bed Stand to Sit: 2: Max assist;With upper extremity assist Stand Pivot Transfers: 2: Max assist Lateral/Scoot Transfers: 3: Mod assist Lateral/Scoot Transfer Details (indicate cue type and reason): mod assist for lateral scoot transfer to left wheelchair to mat; max assist scooting to right. Locomotion  Ambulation Ambulation/Gait Assistance: 1: +2 Total assist Ambulation/Gait Assistance Details: Patient ambulated 4 steps using railing in hallway with +1 assist for facilitation and +1 to follow with wheelchair. Gait Gait: Yes Gait Pattern: Step-to pattern;Decreased step length - right;Decreased step length - left;Decreased stance time - right;Decreased  weight shift to right;Decreased dorsiflexion - right;Trunk flexed;Narrow base of support (pt requires max facilitation for right knee extension in stance. Pt tends to adduct during right swing and needs total assis for placement.) Stairs / Additional Locomotion Stairs: Yes Stairs Assistance: 1: +2 Total assist Stair Management Technique: One rail Left;Step to pattern;Forwards Number of Stairs: 3 Height of Stairs: 4 Wheelchair Mobility Wheelchair Mobility: Yes Wheelchair Assistance: 3: Mod assist Wheelchair Propulsion: Left upper extremity;Left lower extremity Wheelchair Parts Management: Needs assistance Distance: 150 feet  Trunk/Postural Assessment  Cervical Assessment Cervical Assessment: Exceptions to [facility] Cervical Strength Overall Cervical Strength Comments: patient tends to keep head flexed and rotated to left Thoracic Assessment Thoracic Assessment: Within Functional Limits Lumbar Assessment Lumbar Assessment: Within Functional Limits Postural Control Postural Control: Deficits on evaluation Trunk Control: patient tends to keep trunk flexed with posterior pelvic tilt. Patient sits to left of midline and can correct with cueing.   Balance Static Sitting Balance Static  Sitting - Balance Support: Left upper extremity supported;Feet supported Static Sitting - Level of Assistance: 5: Stand by assistance Static Standing Balance Static Standing - Balance Support: Left upper extremity supported Static Standing - Level of Assistance: 1: +1 Total assist Extremity Assessment  RUE Assessment RUE Assessment: Exceptions to [facility] RUE Strength RUE Overall Strength Comments: no active movement noted; flaccid UE; patient grimaced with some pain with passive shoulder flexion. LUE Assessment LUE Assessment: Within Functional Limits RLE Assessment RLE Assessment: Exceptions to [facility] RLE Strength RLE Overall Strength Comments: patient with trace + to 2-/5 for hip flexion, extension, abduction and adduction; no active knee flexion/extension or dorsiflexion noted. LLE Assessment LLE Assessment: Within Functional Limits (grossly 4/5)  FIM:  FIM - Bed/Chair Transfer Bed/Chair Transfer: 3: Supine > Sit: Mod A (lifting assist/Pt. 50-74%/lift 2 legs;4: Sit > Supine: Min A (steadying pt. > 75%/lift 1 leg);2: Bed > Chair or W/C: Max A (lift and lower assist);2: Chair or W/C > Bed: Max A (lift and lower assist) FIM - Locomotion: Wheelchair Distance: 150 feet Locomotion: Wheelchair: 3: Travels 150 ft or more: maneuvers on rugs and over door sills with moderate assistance  (Pt: 50 - 74%) FIM - Locomotion: Ambulation Locomotion: Ambulation Assistive Devices: Other (comment) (railing in hallway) Ambulation/Gait Assistance: 1: +2 Total assist Locomotion: Ambulation: 1: Two helpers FIM - Locomotion: Stairs Locomotion: Building control surveyor: Radio broadcast assistant - 1 Locomotion: Stairs: 1: Two helpers   Refer to [employer] for Long Term Goals  Recommendations for other services: Neuropsych  Discharge Criteria: Patient will be discharged from PT if patient refuses treatment 3 consecutive times without medical reason, if treatment goals not met, if there is a change in medical status, if patient  makes no progress towards goals or if patient is discharged from hospital.  The above assessment, treatment plan, treatment alternatives and goals were discussed and mutually agreed upon: by patient  Alma Friendly 06/10/2013, 9:27 AM

## 2013-06-10 NOTE — Progress Notes (Addendum)
Inpatient Diabetes Program Recommendations  AACE/ADA: New Consensus Statement on Inpatient Glycemic Control (2013)  Target Ranges:  Prepandial:   less than 140 mg/dL      Peak postprandial:   less than 180 mg/dL (1-2 hours)      Critically ill patients:  140 - 180 mg/dL   Persistent hyperglycemia  Inpatient Diabetes Program Recommendations Insulin - Basal: Fasting glucose levels are high.  Please increase lantus to 10-15 units daily Please also consider OP education at [facility] for family and pt if possible. If pt is to be discharged home on insulin, please order insulin instruction for family. Would recommend an insulin pen rather than vial and syringe.  Thank you, Lenor Coffin, RN, CNS, Diabetes Coordinator [phone])

## 2013-06-10 NOTE — Progress Notes (Signed)
Placed patient on CPAP via auto-mode with minimum pressure set at 4cm and maximum pressure set at 20cm. Sp02=95%

## 2013-06-10 NOTE — Evaluation (Signed)
Speech Language Pathology Assessment and Plan  Patient Details  Name: Adam Callahan MRN: [id number] Date of Birth: 12/27/1960  SLP Diagnosis: Aphasia;Dysarthria;Cognitive Impairments;Speech and Language deficits;Dysphagia  Rehab Potential: Good ELOS: 18-20 days   Today's Date: 06/10/2013 Time: 4098-1191 Time Calculation (min): 75 min  Problem List:  Patient Active Problem List   Diagnosis Date Noted  . 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 Dehydration 06/07/2013  . Hypokalemia 06/07/2013  . Dyslipidemia 06/07/2013  . CVA (cerebral infarction) 06/06/2013  . Diabetes mellitus type 2, uncontrolled 06/06/2013  . Rhabdomyolysis 06/06/2013   Past Medical History:  Past Medical History  Diagnosis Date  . Diabetes mellitus    Past Surgical History: No past surgical history on file.  Assessment / Plan / Recommendation Clinical Impression  Pt is a 52 y.o. right-handed male with history of DM with peripheral neuropathy. Patient was independent PTA, driving . Admitted 06/06/13 with acute right-sided weakness and aphasia after being found on the floor by his family. MRI of the brain acute infarct left PCA territory. There is acute infarct involving the left mid and posterior temporal lobe extending into the left occipital lobe as well acute infarct present left thalamus and left midbrain, left inferior cerebellum. MRI of the neck with occlusions versus embolus versus dissection of the left posterior cerebral artery and left posterior inferior cerebellar artery compatible with acute infarct. Patient did not receive TPA. Presently maintained on a dysphagia 1 thin liquid diet. Hemoglobin A1c 10.9 with insulin therapy as directed. PT/OT evaluation completed 06/07/13 with recommendations of physical medicine rehabilitation consult to consider inpatient rehabilitation services. Patient was felt to be a good candidate for inpatient rehabilitation services was admitted for comprehensive  rehabilitation program 11/19; SLP evaluations completed 11/20. Bedside evaluation revealed what is suspected to be a primary oral dysphagia with no overt s/s of aspiration with cup sips of thin liquid and pureed solids. Pt did exhibit decreased bolus awareness, containment, and cohesion. Speech is severely dysarthric and marked by imprecise articulation, fast rate, and mildly low vocal intensity that pt can increase with cues. Pt presents with severe impairment in confrontational naming, although spontaneous speech is at the sentence and conversational level. Pt also presents with disorientation to time and situation, right inattention, impulsivity, and impairments in sustained attention, completion of two-step commands, recall and working memory, basic problem solving, and intellectual awareness. Pt will benefit from SLP services to maximize swallowing safety, communication, and functional independence prior to discharge.    SLP Assessment  Patient will need skilled Speech Lanaguage Pathology Services during CIR admission    Recommendations  Diet Recommendations: Dysphagia 1 (Puree);Thin liquid Liquid Administration via: Cup;No straw Medication Administration: Crushed with puree Supervision: Patient able to self feed;Full supervision/cueing for compensatory strategies Compensations: Slow rate;Small sips/bites;Check for pocketing;Check for anterior loss;Follow solids with liquid Postural Changes and/or Swallow Maneuvers: Seated upright 90 degrees;Upright 30-60 min after meal Oral Care Recommendations: Oral care BID Patient destination: Home Follow up Recommendations: Home Health SLP;24 hour supervision/assistance Equipment Recommended: None recommended by SLP    SLP Frequency 5 out of 7 days   SLP Treatment/Interventions Cognitive remediation/compensation;Cueing hierarchy;Dysphagia/aspiration precaution training;Environmental controls;Functional tasks;Internal/external aids;Oral motor  exercises;Patient/family education;Speech/Language facilitation;Therapeutic Activities;Therapeutic Exercise    Pain   Prior Functioning Cognitive/Linguistic Baseline: Within functional limits Type of Home: House  Lives With: Alone Available Help at Discharge: Family;Friend(s);Available 24 hours/day Vocation: Full time employment Field seismologist, [employer] (although injured and temporarily on leave))  Short Term Goals: Week 1: SLP Short Term Goal 1 (Week 1):  Pt will demonstrate effective mastication of Dys 2 textures with Min cues to assess readiness for advancement. SLP Short Term Goal 2 (Week 1): Pt will perform oral motor exercises with Max cues SLP Short Term Goal 3 (Week 1): Pt will use external aids to demonstrate orientation x4 with Max cues SLP Short Term Goal 4 (Week 1): Pt will name common objects with 80% accuracy with Max cues SLP Short Term Goal 5 (Week 1): Pt will identify at least 1 cognitive and 1 physical impairment with Max cues  See FIM for current functional status Refer to Care Plan for Long Term Goals  Recommendations for other services: None  Discharge Criteria: Patient will be discharged from SLP if patient refuses treatment 3 consecutive times without medical reason, if treatment goals not met, if there is a change in medical status, if patient makes no progress towards goals or if patient is discharged from hospital.  The above assessment, treatment plan, treatment alternatives and goals were discussed and mutually agreed upon: by patient  [name] 06/10/2013, 5:35 PM  [name], M.A. CCC-SLP [phone]

## 2013-06-10 NOTE — Discharge Summary (Signed)
Physician Discharge Summary  Adam Callahan:[id number] DOB: 1960/09/24 DOA: 06/06/2013  PCP: No PCP Per Patient  Admit date: 06/06/2013 Discharge date: 06/09/2013  Time spent: 45 minutes  Recommendations for Outpatient Follow-up:  Patient will be discharged to inpatient rehabilitation. He should continue physical therapy as set forth by inpatient rehabilitation. He should continue medications as prescribed.  Discharge Diagnoses:  Active Problems:   CVA (cerebral infarction)   Diabetes mellitus type 2, uncontrolled   Rhabdomyolysis   Dehydration   Hypokalemia   Dyslipidemia   Discharge Condition:  Stable  Diet recommendation: Dysphagia 1  Filed Weights   06/06/13 2203 06/08/13 0427  Weight: 77.5 kg (170 lb 13.7 oz) 77.5 kg (170 lb 13.7 oz)    History of present illness:  Adam Callahan is a 52 y.o. male with history of diabetes mellitus was brought to the ER after patient's family found him on the floor at his house this evening. Patient was last seen normal on Thursday 3 days ago. And patient's daughter today had come to his house and found him on the floor and EMS was called and patient was found to be having expressive aphasia with right-sided hemiplegia. CT of the head done shows multiple subacute nonhemorrhagic infarcts mild midline shift and mass effect. Neurologist on-call Dr. Amada Jupiter has already evaluated the patient and patient has been admitted for further workup. Patient on my exam is still able to follow commands. But able to not give much history due to aphasia. Patient has mild pain in his lower extremities. As per the family he had a fall last month.   Hospital Course:  This 52 year old male with a history of diabetes mellitus was brought to emergency department by his family who found him on the floor. Patient was noted to have expressive aphasia with right-sided he hemiplegia on admission. CT of the head did show multiple subacute nonhemorrhagic  infarcts with mild midline shift and mass effect. Neurology was consulted. 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 Patient was placed on Plavix for secondary stroke prevention. Patient was also placed on a statin 22 and LDL of 144. Patient seen by physical therapy as well as occupational therapy who recommended inpatient rehabilitation. Of note patient had posttraumatic fall in October 2014 for which his vertebral artery dissection may have been attributed to.  Echocardiogram as well as carotid Doppler conducted results are stated below. Patient will need to likely have patient followup with neurology as well as vascular surgery. This should be set up after patient is released from inpatient rehabilitation. He should continue physical therapy as well as occupational therapy with speech therapy as outlined by inpatient rehabilitation specialists.  Procedures: Carotid Dopplers Bilateral carotid artery duplex: 1-39% ICA stenosis. Vertebral artery flow is antegrade.   2-D echocardiogram  - Left ventricle: The cavity size was normal. There was mild concentric hypertrophy. Systolic function was normal. The estimated ejection fraction was in the range of 55% to 60%. Although no diagnostic regional wall motion abnormality was identified, this possibility cannot be completely excluded on the basis of this study. Doppler parameters are consistent with abnormal left ventricular relaxation (grade 1 diastolic dysfunction). - Pulmonary arteries: Systolic pressure was mildly increased. PA peak pressure: 34mm Hg (S).  Impressions: No cardiac source of emboli was indentified   Consultations: Neurology  Discharge Exam: Filed Vitals:   06/09/13 1013  BP: 156/76  Pulse: 86  Temp: 98.2 F (36.8 C)  Resp: 18  General: Well developed, well nourished, NAD, appears stated age  HEENT: NCAT, PERRLA, EOMI, Anicteic Sclera, mucous membranes moist. No pharyngeal erythema or  exudates  Neck: Supple, no JVD, no masses  Cardiovascular: S1 S2 auscultated, no rubs, murmurs or gallops. Regular rate and rhythm.  Respiratory: Clear to auscultation bilaterally with equal chest rise  Abdomen: Soft, nontender, nondistended, + bowel sounds  Extremities: warm dry without cyanosis clubbing or edema  Neuro: AAOx3, cranial nerves grossly intact. Strength 5/5 in LUE and LLE.  Strength 2/5 in RUE and RLE.  Skin: Without rashes exudates or nodules  Psych: Normal affect and demeanor with intact judgement and insight  Discharge Instructions      Discharge Orders   Future Appointments Provider Department Dept Phone   06/10/2013 10:30 AM Daneil Dan, OT [facility] A [phone]   06/10/2013 1:30 PM [name], CCC-SLP [facility] A [phone]   Future Orders Complete By Expires   Diet - low sodium heart healthy  As directed    Discharge instructions  As directed    Comments:     Patient will be discharged to inpatient rehabilitation. He should continue physical therapy as set forth by inpatient rehabilitation. He should continue medications as prescribed.   Increase activity slowly  As directed        Medication List    STOP taking these medications       diazepam 5 MG tablet  Commonly known as:  VALIUM     oxyCODONE-acetaminophen 5-325 MG per tablet  Commonly known as:  PERCOCET/ROXICET      TAKE these medications       acetaminophen 500 MG tablet  Commonly known as:  TYLENOL  Take 2 tablets (1,000 mg total) by mouth every 6 (six) hours as needed for mild pain, moderate pain or fever.     aspirin 81 MG chewable tablet  Chew 1 tablet (81 mg total) by mouth daily.     atorvastatin 20 MG tablet  Commonly known as:  LIPITOR  Take 1 tablet (20 mg total) by mouth daily at 6 PM.     clopidogrel 75 MG tablet  Commonly known as:  PLAVIX  Take 1 tablet (75 mg total) by mouth  daily with breakfast.     insulin aspart 100 UNIT/ML injection  Commonly known as:  novoLOG  Inject 0-9 Units into the skin 3 (three) times daily with meals.     insulin glargine 100 UNIT/ML injection  Commonly known as:  LANTUS  Inject 0.05 mLs (5 Units total) into the skin daily.       No Known Allergies Follow-up Information   Follow up with Inpatient Rehab.       The results of significant diagnostics from this hospitalization (including imaging, microbiology, ancillary and laboratory) are listed below for reference.    Significant Diagnostic Studies: Ct Head Wo Contrast  06/06/2013   CLINICAL DATA:  Stroke.  Right-sided paralysis.  A phasic.  EXAM: CT HEAD WITHOUT CONTRAST  TECHNIQUE: Contiguous axial images were obtained from the base of the skull through the vertex without intravenous contrast.  COMPARISON:  05/06/2013  FINDINGS: There is an extensive nonhemorrhagic infarction of the entire left posterior cerebral artery distribution. In addition, there is a nonhemorrhagic infarction in the left posterior inferior cerebellar artery distribution. There is edema associated with these infarcts. There is also a nonhemorrhagic infarction in the left thalamus extending into the left cerebral peduncle.  There is compression of the occipital and temporal horns of the left lateral ventricle as well as effacement of cortical sulci in the left temporal and occipital regions.  Osseous structures are normal.  There is a 3 mm midline shift from left-to-right.  IMPRESSION: Multiple subacute nonhemorrhagic infarctions including the left cerebellum, left occipital lobe, left temporal lobe, and left thalamus and left cerebral peduncle.  Slight midline shift and mass effect.  No hemorrhage.   Electronically Signed   By: Geanie Cooley M.D.   On: 06/06/2013 19:14   Mr Maxine Glenn Head Wo Contrast  06/09/2013   CLINICAL DATA: Right-sided weakness. Stroke. Fall 1 month ago.  EXAM: MRI HEAD WITHOUT AND WITH  CONTRAST AND MRA HEAD WITHOUT AND WITH CONTRAST AND MRI NECK WITHOUT AND WITH CONTRAST  TECHNIQUE: Multiplanar, multiecho pulse sequences of the brain and surrounding structures were obtained without and with intravenous contrast. Angiographic images of the head were obtained using MRA technique without and with contrast. Multiplanar, multiecho pulse sequences of the neck and surrounding structures were obtained without and with intravenous contrast.  CONTRAST 20mL MULTIHANCE GADOBENATE DIMEGLUMINE 529 MG/ML IV SOLN  COMPARISON: CT head 06/06/2013 and 05/06/2013.  FINDINGS: MRI HEAD FINDINGS  Acute infarct left PCA territory. There is acute infarction involving the left mid and posterior temporal lobe extending into the left occipital lobe. Acute infarct also present in the left thalamus and left midbrain. Acute infarct in the left inferior cerebellum. Acute infarct in the splenium of the corpus callosum. Tiny acute infarct right posterior inferior cerebellar artery territory.  Gradient echo images reveals a mild amount of hemorrhage in the left PICA infarct. There is also evidence of clot in the left posterior cerebral artery consistent with an embolus.  Postcontrast imaging reveals mild enhancement in the left PICA infarct. No underlying mass lesion is identified.  Ventricle size is normal. 7 mm midline shift toward the right. Scattered small white matter hyperintensities bilaterally consistent with chronic microvascular ischemic change.  MRA HEAD FINDINGS  Both vertebral arteries are patent to the basilar. Right vertebral artery is dominant.  Left posterior inferior cerebellar artery not identified and appears occluded. There is stenosis in the right posterior inferior cerebellar artery which could be embolus or atherosclerotic disease. The basilar is patent. AICA is patent bilaterally, left AICA is dominant. Bilateral superior cerebellar arteries are patent.  Irregularity in the right posterior cerebral artery  consistent with either embolus or atherosclerotic disease. Occluded left posterior cerebral artery consistent with an embolus causing stroke.  Cavernous carotid is patent bilaterally. Anterior cerebral artery patent bilaterally. Right middle cerebral artery is widely patent.  Focal moderate to severe stenosis in the proximal aspect of the temporal branch of the left middle cerebral artery.  Negative for cerebral aneurysm.  MRI NECK FINDINGS  Two vessel arch. Left common carotid artery over arises from the innominate artery. Proximal great vessels are patent.  Right carotid: Right common carotid artery widely patent. Atherosclerotic type plaque in the carotid bulb narrowing the lumen by approximately 25% diameter stenosis. Cervical carotid is patent.  Left carotid: Left common carotid artery widely patent. Left carotid bifurcation is widely patent and the left cervical carotid is patent.  Vertebral arteries: Right vertebral artery is dominant. There is a mild stenosis the distal right vertebral artery. There is irregularity in the mid left vertebral artery which could be due to dissection or atherosclerotic disease. Given the history of fall, and acute infarct, dissection is the most likely cause of this finding.  IMPRESSION: Acute infarct left posterior cerebral artery territory involving the left inferior cerebellum, and left temporal and occipital lobe as well as the left thalamus. There appears to be in embolus the left posterior cerebral artery. Mild amount of hemorrhage in the left posterior inferior cerebellar artery infarct.  Occlusions of the left posterior cerebral artery and left posterior inferior cerebellar artery compatible with acute infarct and probable embolism.  Moderate to severe focal stenosis in the proximal aspect of the temporal branch of the left middle cerebral artery.  Segmental irregularity left vertebral artery most likely a cause for embolus causing acute infarction. Favor dissection  given the recent history of a fall. Atherosclerotic disease also possible.  Critical Value/emergent results were called by telephone at the time of interpretation on 06/07/2013 at 2:00 PM to Dr. Noel Christmas , who verbally acknowledged these results.   Electronically Signed   By: Marlan Palau M.D.   On: 06/09/2013 09:35   Mr Angiogram Neck W Wo Contrast  06/07/2013   CLINICAL DATA:  Right-sided weakness.  Stroke.  Fall 1 month ago.  EXAM: MRI HEAD WITHOUT AND WITH CONTRAST AND MRA HEAD WITHOUT AND WITH CONTRAST AND MRI NECK WITHOUT AND WITH CONTRAST  TECHNIQUE: Multiplanar, multiecho pulse sequences of the brain and surrounding structures were obtained without and with intravenous contrast. Angiographic images of the head were obtained using MRA technique without and with contrast. Multiplanar, multiecho pulse sequences of the neck and surrounding structures were obtained without and with intravenous contrast.  CONTRAST:  20mL MULTIHANCE GADOBENATE DIMEGLUMINE 529 MG/ML IV SOLN  COMPARISON:  CT head 06/06/2013 and 05/06/2013.  FINDINGS: MRI HEAD FINDINGS  Acute infarct left PCA territory. There is acute infarction involving the left mid and posterior temporal lobe extending into the left occipital lobe. Acute infarct also present in the left thalamus and left midbrain. Acute infarct in the left inferior cerebellum. Acute infarct in the splenium of the corpus callosum. Tiny acute infarct right posterior inferior cerebellar artery territory.  Gradient echo images reveals a mild amount of hemorrhage in the left PICA infarct. There is also evidence of clot in the left posterior cerebral artery consistent with an embolus.  Postcontrast imaging reveals mild enhancement in the left PICA infarct. No underlying mass lesion is identified.  Ventricle size is normal. 7 mm midline shift toward the right. Scattered small white matter hyperintensities bilaterally consistent with chronic microvascular ischemic change.  MRA  HEAD FINDINGS  Both vertebral arteries are patent to the basilar. Right vertebral artery is dominant.  Left posterior inferior cerebellar artery not identified and appears occluded. There is stenosis in the right posterior inferior cerebellar artery which could be embolus or atherosclerotic disease. The basilar is patent. AICA is patent bilaterally, left AICA is dominant. Bilateral superior cerebellar arteries are patent.  Irregularity in the right posterior cerebral artery consistent with either embolus or atherosclerotic disease. Occluded left posterior cerebral artery consistent with an embolus causing stroke.  Cavernous carotid is patent bilaterally. Anterior cerebral artery patent bilaterally. Right middle cerebral artery is widely patent.  Focal moderate to severe stenosis in the proximal aspect of the temporal branch of the left middle cerebral artery.  Negative for cerebral aneurysm.  MRI NECK FINDINGS  Two vessel arch. Left common carotid artery over arises from the innominate artery. Proximal great vessels are patent.  Right carotid: Right common carotid artery widely patent. Atherosclerotic type plaque in the carotid bulb narrowing the lumen by approximately 25% diameter stenosis. Cervical carotid is patent.  Left carotid: Left common carotid artery widely patent. Left carotid bifurcation is widely patent and the left cervical carotid is patent.  Vertebral arteries: Right vertebral artery is dominant. There is a mild stenosis the distal right vertebral artery. There is irregularity in the mid left vertebral artery which could be due to dissection or atherosclerotic disease. Given the history of fall, and acute infarct, dissection is the most likely cause of this finding.  IMPRESSION: Acute infarct left posterior cerebral artery territory involving the left inferior cerebellum, and left temporal and occipital lobe as well as the left thalamus. There appears to be in embolus the left posterior cerebral  artery. Mild amount of hemorrhage in the left posterior inferior cerebellar artery infarct.  Occlusions of the left posterior cerebral artery and left posterior inferior cerebellar artery compatible with acute infarct and probable embolism.  Moderate to severe focal stenosis in the proximal aspect of the temporal branch of the left middle cerebral artery.  Segmental irregularity left vertebral artery most likely a cause for embolus causing acute infarction. Favor dissection given the recent history of a fall. Atherosclerotic disease also possible.  Critical Value/emergent results were called by telephone at the time of interpretation on 06/07/2013 at 2:00 PM to Dr.Charles Roseanne Reno , who verbally acknowledged these results.   Electronically Signed   By: Marlan Palau M.D.   On: 06/07/2013 14:03   Mr Laqueta Jean ZO Contrast  06/09/2013   CLINICAL DATA: Right-sided weakness. Stroke. Fall 1 month ago.  EXAM: MRI HEAD WITHOUT AND WITH CONTRAST AND MRA HEAD WITHOUT AND WITH CONTRAST AND MRI NECK WITHOUT AND WITH CONTRAST  TECHNIQUE: Multiplanar, multiecho pulse sequences of the brain and surrounding structures were obtained without and with intravenous contrast. Angiographic images of the head were obtained using MRA technique without and with contrast. Multiplanar, multiecho pulse sequences of the neck and surrounding structures were obtained without and with intravenous contrast.  CONTRAST 20mL MULTIHANCE GADOBENATE DIMEGLUMINE 529 MG/ML IV SOLN  COMPARISON: CT head 06/06/2013 and 05/06/2013.  FINDINGS: MRI HEAD FINDINGS  Acute infarct left PCA territory. There is acute infarction involving the left mid and posterior temporal lobe extending into the left occipital lobe. Acute infarct also present in the left thalamus and left midbrain. Acute infarct in the left inferior cerebellum. Acute infarct in the splenium of the corpus callosum. Tiny acute infarct right posterior inferior cerebellar artery territory.  Gradient  echo images reveals a mild amount of hemorrhage in the left PICA infarct. There is also evidence of clot in the left posterior cerebral artery consistent with an embolus.  Postcontrast imaging reveals mild enhancement in the left PICA infarct. No underlying mass lesion is identified.  Ventricle size is normal. 7 mm midline shift toward the right. Scattered small white matter hyperintensities bilaterally consistent with chronic microvascular ischemic change.  MRA HEAD FINDINGS  Both vertebral arteries are patent to the basilar. Right vertebral artery is dominant.  Left posterior inferior cerebellar artery not identified and appears occluded. There is stenosis in the right posterior inferior cerebellar artery which could be embolus or atherosclerotic disease. The basilar is patent. AICA is patent bilaterally, left AICA is dominant. Bilateral superior cerebellar arteries are patent.  Irregularity in the right posterior cerebral artery consistent with either embolus or atherosclerotic disease. Occluded left posterior cerebral artery consistent with an embolus causing stroke.  Cavernous carotid is patent bilaterally. Anterior cerebral artery patent bilaterally. Right middle cerebral artery is widely patent.  Focal moderate to severe stenosis in the proximal  aspect of the temporal branch of the left middle cerebral artery.  Negative for cerebral aneurysm.  MRI NECK FINDINGS  Two vessel arch. Left common carotid artery over arises from the innominate artery. Proximal great vessels are patent.  Right carotid: Right common carotid artery widely patent. Atherosclerotic type plaque in the carotid bulb narrowing the lumen by approximately 25% diameter stenosis. Cervical carotid is patent.  Left carotid: Left common carotid artery widely patent. Left carotid bifurcation is widely patent and the left cervical carotid is patent.  Vertebral arteries: Right vertebral artery is dominant. There is a mild stenosis the distal right  vertebral artery. There is irregularity in the mid left vertebral artery which could be due to dissection or atherosclerotic disease. Given the history of fall, and acute infarct, dissection is the most likely cause of this finding.  IMPRESSION: Acute infarct left posterior cerebral artery territory involving the left inferior cerebellum, and left temporal and occipital lobe as well as the left thalamus. There appears to be in embolus the left posterior cerebral artery. Mild amount of hemorrhage in the left posterior inferior cerebellar artery infarct.  Occlusions of the left posterior cerebral artery and left posterior inferior cerebellar artery compatible with acute infarct and probable embolism.  Moderate to severe focal stenosis in the proximal aspect of the temporal branch of the left middle cerebral artery.  Segmental irregularity left vertebral artery most likely a cause for embolus causing acute infarction. Favor dissection given the recent history of a fall. Atherosclerotic disease also possible.  Critical Value/emergent results were called by telephone at the time of interpretation on 06/07/2013 at 2:00 PM to Dr. Noel Christmas , who verbally acknowledged these results.   Electronically Signed   By: Marlan Palau M.D.   On: 06/09/2013 09:34    Microbiology: Recent Results (from the past 240 hour(s))  MRSA PCR SCREENING     Status: None   Collection Time    06/06/13 10:29 PM      Result Value Range Status   MRSA by PCR NEGATIVE  NEGATIVE Final   Comment:            The GeneXpert MRSA Assay (FDA     approved for NASAL specimens     only), is one component of a     comprehensive MRSA colonization     surveillance program. It is not     intended to diagnose MRSA     infection nor to guide or     monitor treatment for     MRSA infections.     Labs: Basic Metabolic Panel:  Recent Labs Lab 06/06/13 1806 06/06/13 1837 06/07/13 0525 06/08/13 0620 06/09/13 1740 06/10/13 0530  NA  137 140 141 136  --  136  K 3.6 3.3* 3.3* 3.5  --  3.9  CL 97 101 103 102  --  99  CO2 27  --  27 23  --  26  GLUCOSE 265* 272* 187* 134*  --  214*  BUN 20 21 17 12   --  7  CREATININE 0.69 1.00 0.83 0.63 0.63 0.55  CALCIUM 9.2  --  8.7 8.3*  --  8.9   Liver Function Tests:  Recent Labs Lab 06/06/13 1806 06/07/13 0525 06/10/13 0530  AST 26 21 21   ALT 22 18 21   ALKPHOS 71 63 71  BILITOT 0.8 0.7 0.4  PROT 8.1 7.4 7.1  ALBUMIN 3.7 3.2* 3.2*   No results found for this basename: LIPASE, AMYLASE,  in the last 168 hours No results found for this basename: AMMONIA,  in the last 168 hours CBC:  Recent Labs Lab 06/06/13 1806 06/06/13 1837 06/07/13 0525 06/08/13 0620 06/09/13 1740 06/10/13 0530  WBC 11.5*  --  9.4 7.7 7.5 7.1  NEUTROABS 8.3*  --  6.0  --   --  4.0  HGB 15.9 16.0 13.4 12.4* 13.3 13.3  HCT 43.8 47.0 40.2 35.4* 37.1* 37.8*  MCV 84.2  --  84.3 83.9 82.6 82.5  PLT 301  --  293 277 323 314   Cardiac Enzymes:  Recent Labs Lab 06/06/13 1806 06/06/13 2252 06/07/13 0525  CKTOTAL 1086*  --  754*  TROPONINI <0.30 <0.30  --    BNP: BNP (last 3 results) No results found for this basename: PROBNP,  in the last 8760 hours CBG:  Recent Labs Lab 06/08/13 2052 06/09/13 0713 06/09/13 1147 06/09/13 1612 06/09/13 2058  GLUCAP 234* 182* 247* 272* 294*       Signed:  Ambika Zettlemoyer  [facility] 06/10/2013, 9:50 AM

## 2013-06-10 NOTE — IPOC Note (Signed)
Overall Plan of Care [facility]) Patient Details Name: JAHMEEK SHIRK MRN: [id number] DOB: 1960/12/14  Admitting Diagnosis: CVA  Hospital Problems: Principal Problem:   Embolic cerebral infarction     Functional Problem List: Nursing Bowel;Bladder;Medication Management;Motor;Nutrition;Pain;Perception;Safety;Sensory;Skin Integrity  PT Balance;Motor;Sensory  OT Balance;Behavior;Cognition;Endurance;Motor;Pain;Perception;Safety;Sensory;Vision  SLP    TR         Basic ADL's: OT Eating;Grooming;Bathing;Dressing;Toileting     Advanced  ADL's: OT       Transfers: PT Bed Mobility;Bed to Chair;Car  OT Toilet;Tub/Shower     Locomotion: PT Ambulation;Wheelchair Mobility     Additional Impairments: OT Fuctional Use of Upper Extremity  SLP        TR      Anticipated Outcomes Item Anticipated Outcome  Self Feeding supervision  Swallowing  Min A with least restrictive PO   Basic self-care  min assist overall   Toileting  mod assist    Bathroom Transfers min assist overall   Bowel/Bladder  mod I  Transfers  supervision basic transfers; min assist car transfers  Locomotion  mod assist short distance ambulation in controlled environment; supervision wheelchair mobility in controlled and home environment; mod assist up and down 1 step to enter home  Communication  Min A  Cognition  Min A  Pain  mod I  Safety/Judgment  min assist    Therapy Plan: PT Intensity: Minimum of 1-2 x/day ,45 to 90 minutes PT Frequency: 5 out of 7 days PT Duration Estimated Length of Stay: 18 - 20 days OT Intensity: Minimum of 1-2 x/day, 45 to 90 minutes OT Frequency: 5 out of 7 days OT Duration/Estimated Length of Stay: 18-20 days SLP Intensity: Minumum of 1-2 x/day, 30 to 90 minutes SLP Frequency: 5 out of 7 days SLP Duration/Estimated Length of Stay: 18-20 days       Team Interventions: Nursing Interventions Patient/Family Education;Bladder Management;Bowel Management;Disease  Management/Prevention;Pain Management;Medication Management;Skin Care/Wound Management;Cognitive Remediation/Compensation;Dysphagia/Aspiration Precaution Training;Discharge Planning;Psychosocial Support;Other (comment) (diabetes education)  PT interventions Ambulation/gait training;Balance/vestibular training;Discharge planning;Functional mobility training;Neuromuscular re-education;Patient/family education;Therapeutic Activities;Therapeutic Exercise;UE/LE Strength taining/ROM;Wheelchair propulsion/positioning  OT Interventions Balance/vestibular training;Cognitive remediation/compensation;Community reintegration;Discharge planning;Functional electrical stimulation;Functional mobility training;Neuromuscular re-education;Pain management;Psychosocial support;Patient/family education;Self Care/advanced ADL retraining;Therapeutic Activities;Therapeutic Exercise;UE/LE Strength taining/ROM;UE/LE Coordination activities;Visual/perceptual remediation/compensation  SLP Interventions Cognitive remediation/compensation;Cueing hierarchy;Dysphagia/aspiration precaution training;Environmental controls;Functional tasks;Internal/external aids;Oral motor exercises;Patient/family education;Speech/Language facilitation;Therapeutic Activities;Therapeutic Exercise  TR Interventions    SW/CM Interventions Discharge Planning;Psychosocial Support;Patient/Family Education    Team Discharge Planning: Destination: PT-Home ,OT- Home , SLP-Home Projected Follow-up: PT-Home health PT;24 hour supervision/assistance, OT-  Home health OT;Outpatient OT, SLP-Home Health SLP;24 hour supervision/assistance Projected Equipment Needs: PT-Wheelchair (measurements);Wheelchair cushion (measurements);Other (comment) (AD for ambulation TBD), OT- Tub/shower bench;3 in 1 bedside comode, SLP-None recommended by SLP Patient/family involved in discharge planning: PT- Patient;Family member/caregiver,  OT-Family member/caregiver;Patient,  SLP-Patient;Family member/caregiver  MD ELOS: 22-27days Medical Rehab Prognosis:  Good Assessment:   52 y.o. right-handed male with history of diabetes mellitus with peripheral neuropathy. Patient was independent prior to admission driving . Admitted 06/06/2013 with acute right-sided weakness and aphasia after being found on the floor by his family. MRI of the brain acute infarct left PCA territory. There is acute infarct involving the left mid and posterior temporal lobe extending into the left occipital lobe as well acute infarct present left thalamus and left midbrain, left inferior cerebellum. MRA of the head negative for aneurysm. MRI of the neck with occlusions versus embolus versus dissection of the left posterior cerebral artery and left posterior inferior cerebellar artery compatible with acute infarct. Echocardiogram with ejection fraction of  55% no wall motion abnormalities and grade 1 diastolic dysfunction. Carotid Dopplers with no ICA stenosis. Patient did not receive TPA.   Now requiring 24/7 Rehab RN,MD, as well as CIR level PT, OT and SLP.  Treatment team will focus on ADLs and mobility with goals set at [facility]   See Team Conference Notes for weekly updates to the plan of care

## 2013-06-10 NOTE — Progress Notes (Signed)
52 y.o. right-handed male with history of diabetes mellitus with peripheral neuropathy. Patient was independent prior to admission driving . Admitted 06/06/2013 with acute right-sided weakness and aphasia after being found on the floor by his family. MRI of the brain acute infarct left PCA territory. There is acute infarct involving the left mid and posterior temporal lobe extending into the left occipital lobe as well acute infarct present left thalamus and left midbrain, left inferior cerebellum. MRA of the head negative for aneurysm. MRI of the neck with occlusions versus embolus versus dissection of the left posterior cerebral artery and left posterior inferior cerebellar artery compatible with acute infarct. Echocardiogram with ejection fraction of 55% no wall motion abnormalities and grade 1 diastolic dysfunction. Carotid Dopplers with no ICA stenosis. Patient did not receive TPA. Neurology services consulted presently on aspirin and Plavix for CVA prophylaxis as well as subcutaneous Lovenox for DVT prophylaxis. Presently maintained on a dysphagia 1 thin liquid diet. Hemoglobin A1c 10.9 Subjective/Complaints:   Objective: Vital Signs: Blood pressure 134/77, pulse 83, temperature 98.4 F (36.9 C), temperature source Oral, resp. rate 18, height 5\' 10"  (1.778 m), weight 82.5 kg (181 lb 14.1 oz), SpO2 100.00%. No results found. Results for orders placed during the hospital encounter of 06/09/13 (from the past 72 hour(s))  GLUCOSE, CAPILLARY     Status: Abnormal   Collection Time    06/09/13  4:12 PM      Result Value Range   Glucose-Capillary 272 (*) 70 - 99 mg/dL   Comment 1 Notify RN    CBC     Status: Abnormal   Collection Time    06/09/13  5:40 PM      Result Value Range   WBC 7.5  4.0 - 10.5 K/uL   RBC 4.49  4.22 - 5.81 MIL/uL   Hemoglobin 13.3  13.0 - 17.0 g/dL   HCT 16.1 (*) 09.6 - 04.5 %   MCV 82.6  78.0 - 100.0 fL   MCH 29.6  26.0 - 34.0 pg   MCHC 35.8  30.0 - 36.0 g/dL   RDW  40.9  81.1 - 91.4 %   Platelets 323  150 - 400 K/uL  CREATININE, SERUM     Status: None   Collection Time    06/09/13  5:40 PM      Result Value Range   Creatinine, Ser 0.63  0.50 - 1.35 mg/dL   GFR calc non Af Amer >90  >90 mL/min   GFR calc Af Amer >90  >90 mL/min   Comment: (NOTE)     The eGFR has been calculated using the CKD EPI equation.     This calculation has not been validated in all clinical situations.     eGFR's persistently <90 mL/min signify possible Chronic Kidney     Disease.  GLUCOSE, CAPILLARY     Status: Abnormal   Collection Time    06/09/13  8:58 PM      Result Value Range   Glucose-Capillary 294 (*) 70 - 99 mg/dL     HEENT: normal Cardio: RRR and no murmur Resp: CTA B/L and unlabored GI: BS positive and NT, non distended Extremity:  Pulses positive and No Edema Skin:   Intact Neuro: Lethargic, Flat and Cranial Nerve Abnormalities decreased accomodation, R Delt bi tri grip trace, R HF,KE , ADF trace, Left side 4+/5,  Reduced sensation in RUE and RLE.  Speech with reduced output, strained quality, difficulty following commands Musc/Skel:  Normal Gen NAD  Assessment/Plan: 1. 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 Physiatrist is providing close team supervision and 24 hour management of active medical problems listed below. Physiatrist and rehab team continue to assess barriers to discharge/monitor patient progress toward functional and medical goals. FIM:       FIM - Toileting Toileting: 0: Activity did not occur  FIM - Archivist Transfers: 0-Activity did not occur  FIM - Games developer Transfer: 0: Activity did not occur     Comprehension Comprehension Mode: Auditory Comprehension: 3-Understands basic 50 - 74% of the time/requires  cueing 25 - 50%  of the time  Expression Expression Mode: Nonverbal Expression Assistive Devices: 6-Other (Comment) (dysarthic and aphaisic) Expression: 1-Expresses basis less than 25% of the time/requires cueing greater than 75% of the time.  Social Interaction Social Interaction: 3-Interacts appropriately 50 - 74% of the time - May be physically or verbally inappropriate.  Problem Solving Problem Solving: 1-Solves basic less than 25% of the time - needs direction nearly all the time or does not effectively solve problems and may need a restraint for safety  Memory Memory: 3-Recognizes or recalls 50 - 74% of the time/requires cueing 25 - 49% of the time Medical Problem List and Plan:  1. Embolic left PCA infarct  2. DVT Prophylaxis/Anticoagulation: Subcutaneous Lovenox. Monitor platelet counts and any signs of bleeding  3. Pain Management: Tylenol as needed. Monitor with increased mobility  4. Neuropsych: This patient is not capable of making decisions on his own behalf.  5. Dysphagia/aphasia. Dysphagia 1 and liquids. Monitor for any signs of aspiration. Followup speech therapy.  6. Diabetes mellitus with peripheral neuropathy. Hemoglobin A1c 10.9. Lantus insulin 5 units subcutaneous daily. Check CBGs a.c. and at bedtime. Provide diabetic teaching.  7. Hyperlipidemia. Lipitor    LOS (Days) 1 A FACE TO FACE EVALUATION WAS PERFORMED  KIRSTEINS,ANDREW E 06/10/2013, 6:49 AM

## 2013-06-10 NOTE — Care Management Note (Signed)
Inpatient Rehabilitation Center Individual Statement of Services  Patient Name:  Adam Callahan  Date:  06/10/2013  Welcome to the [facility]patient [facility].  Our goal is to provide you with an individualized program based on your diagnosis and situation, designed to meet your specific needs.  With this comprehensive rehabilitation program, you will be expected to participate in at least 3 hours of rehabilitation therapies Monday-Friday, with modified therapy programming on the weekends.  Your rehabilitation program will include the following services:  Physical Therapy (PT), Occupational Therapy (OT), Speech Therapy (ST), 24 hour per day rehabilitation nursing, Neuropsychology, Case Management (Social Worker), Rehabilitation Medicine, Nutrition Services and Pharmacy Services  Weekly team conferences will be held on Wednesday to discuss your progress.  Your Social Worker will talk with you frequently to get your input and to update you on team discussions.  Team conferences with you and your family in attendance may also be held.  Expected length of stay: 18-20 days  Overall anticipated outcome: min/mod level  Depending on your progress and recovery, your program may change. Your Social Worker will coordinate services and will keep you informed of any changes. Your Social Worker's name and contact numbers are listed  below.  The following services may also be recommended but are not provided by the Inpatient Rehabilitation Center:   Driving Evaluations  Home Health Rehabiltiation Services  Outpatient Rehabilitation Services  Vocational Rehabilitation   Arrangements will be made to provide these services after discharge if needed.  Arrangements include referral to agencies that provide these services.  Your insurance has been verified to be:  [employer] Your primary doctor is:  None  Pertinent information will be shared with your doctor and your insurance company.  Social  Worker:  Dossie Der, SW [phone] or [phone]  Information discussed with and copy given to patient by: Lucy Chris, 06/10/2013, 2:04 PM

## 2013-06-10 NOTE — Plan of Care (Signed)
Problem: RH PAIN MANAGEMENT Goal: RH STG PAIN MANAGED AT OR BELOW PT'S PAIN GOAL Less than 3  Outcome: Not Progressing C/o pain 7/10

## 2013-06-10 NOTE — Progress Notes (Signed)
Social Work Assessment and Plan Social Work Assessment and Plan  Patient Details  Name: Adam Callahan MRN: [id number] Date of Birth: [birth year]  Today's Date: 06/10/2013  Problem List:  Patient Active Problem List   Diagnosis Date Noted  . 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 Dehydration 06/07/2013  . Hypokalemia 06/07/2013  . Dyslipidemia 06/07/2013  . CVA (cerebral infarction) 06/06/2013  . Diabetes mellitus type 2, uncontrolled 06/06/2013  . Rhabdomyolysis 06/06/2013   Past Medical History:  Past Medical History  Diagnosis Date  . Diabetes mellitus    Past Surgical History: No past surgical history on file. Social History:  reports that he has quit smoking. He does not have any smokeless tobacco history on file. He reports that he does not drink alcohol or use illicit drugs.  Family / Support Systems Marital Status: Single Patient Roles: Parent;Other (Comment) Programmer, multimedia) Children: Callahan 2 hours away Other Supports: Adam Callahan  [phone]  [phone]-cell Anticipated Caregiver: Adam Callahan along with Adam Callahan to assist Ability/Limitations of Caregiver: Will try to provide 24 hour care Caregiver Availability: Other (Comment) (Coming up with a plan) Family Dynamics: Pt was very independent and lived alone and worked full time.  He has two aunts and an Callahan locally.  His Callahan is out of town and his son is incarcerated.  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  He is trying to speak and nods his head as if he understands what is being said.  Aunt reports: " He has always been one to hlep others." Recent Psychosocial Issues: Other medical issues-HTN felt controlled Pyschiatric History: No history deferred depression screen at this time due to language issues.  Will continue to monitor and evaluate his progress and look for other signs of his coping.  May need Neuro-psych while here due to young age and his deficits Substance Abuse History: No issues  Patient / Family Perceptions, Expectations & Goals Pt/Family understanding of illness & functional limitations: Pt can point ot his deficits and aunt has a good understanding of his deficits, she has spoken with the MD and feels she is aware of his treatment plan.  She also works in the [employer]. Premorbid pt/family roles/activities: Nephew, Employee, Father, Church member, etc Anticipated changes in roles/activities/participation: resume Pt/family expectations/goals: Aunt states: " I want him to do well and make as much progress as possible here."  " We will do what we need to do to help him recover from this."  [employer]: None Premorbid Home Care/DME Agencies: None Transportation available at discharge: [employer] referrals recommended: Support group (specify) (CVA Support group)  Discharge Planning Living Arrangements: Alone Support Systems: Children;Other relatives;Friends/neighbors;Church/faith community Type of Residence: Private  residence Insurance Resources:  Media planner (specify) Herbalist) Financial Resources: Employment Financial Screen Referred: No Living Expenses: Rent Money Management: Patient Does the patient have any problems obtaining your medications?: No Home Management: Self Patient/Family Preliminary Plans: Return to his home wihch is more accessible, aunts and aunts god Callahan to assist him.  Aware he will require 24 hour care at discharge.  All are CNA's and have experience working with stroke patients.  Will work on SSD if necessary and othe forms pt may need completed. Social Work Anticipated Follow Up Needs: HH/OP;Support Group  Clinical Impression Pleasant gentleman who is motivated and wants to improve and communicate.  Supportive family who are willing to assist.  Will work on [employer] and pt currently covered by [employer].  Aunt pursuing If will continue to cover due to may contributed to his stroke.  She will pursue this.  Continue to work on a safe discharge plan.  Adam Callahan 06/10/2013, 3:34 PM

## 2013-06-11 ENCOUNTER — Inpatient Hospital Stay (HOSPITAL_COMMUNITY): Payer: BC Managed Care – PPO

## 2013-06-11 ENCOUNTER — Inpatient Hospital Stay (HOSPITAL_COMMUNITY): Payer: BC Managed Care – PPO | Admitting: *Deleted

## 2013-06-11 ENCOUNTER — Inpatient Hospital Stay (HOSPITAL_COMMUNITY): Payer: BC Managed Care – PPO | Admitting: Speech Pathology

## 2013-06-11 LAB — GLUCOSE, CAPILLARY
Glucose-Capillary: 256 mg/dL — ABNORMAL HIGH (ref 70–99)
Glucose-Capillary: 347 mg/dL — ABNORMAL HIGH (ref 70–99)
Glucose-Capillary: 325 mg/dL — ABNORMAL HIGH (ref 70–99)

## 2013-06-11 MED ORDER — INSULIN GLARGINE 100 UNIT/ML ~~LOC~~ SOLN
10.0000 [IU] | Freq: Every day | SUBCUTANEOUS | Status: DC
  Administered 2013-06-12 – 2013-06-13 (×2): 10 [IU] via SUBCUTANEOUS
  Filled 2013-06-11 (×2): qty 0.1

## 2013-06-11 NOTE — Progress Notes (Signed)
Patient refusing to use CPAP this shift.  Pulls mask off face when placed and puts hand up to prevent mask from being placed.

## 2013-06-11 NOTE — Plan of Care (Signed)
Problem: RH BOWEL ELIMINATION Goal: RH STG MANAGE BOWEL W/MEDICATION W/ASSISTANCE STG Manage Bowel with Medication with min Assistance.  Outcome: Not Progressing Incontinent this shift

## 2013-06-11 NOTE — Plan of Care (Signed)
Problem: RH BLADDER ELIMINATION Goal: RH STG MANAGE BLADDER WITH ASSISTANCE STG Manage Bladder With mod I  Outcome: Not Progressing Incontinent this shift

## 2013-06-11 NOTE — Plan of Care (Signed)
Problem: RH SKIN INTEGRITY Goal: RH STG SKIN FREE OF INFECTION/BREAKDOWN Min A  Wounds healing appropriately.

## 2013-06-11 NOTE — Plan of Care (Signed)
Problem: RH PAIN MANAGEMENT Goal: RH STG PAIN MANAGED AT OR BELOW PT'S PAIN GOAL Less than 3  Outcome: Not Progressing Report pain rating of 6

## 2013-06-11 NOTE — Plan of Care (Signed)
Problem: RH SAFETY Goal: RH STG DEMO UNDERSTANDING HOME SAFETY PRECAUTIONS Patient will be able to demonstrate safety  Outcome: Not Progressing Poor memory deficit noted.

## 2013-06-11 NOTE — Plan of Care (Signed)
Problem: RH BOWEL ELIMINATION Goal: RH STG MANAGE BOWEL WITH ASSISTANCE STG Manage Bowel with mod I  Outcome: Not Progressing Incontinent this shift

## 2013-06-11 NOTE — Progress Notes (Signed)
52 y.o. right-handed male with history of diabetes mellitus with peripheral neuropathy. Patient was independent prior to admission driving . Admitted 06/06/2013 with acute right-sided weakness and aphasia after being found on the floor by his family. MRI of the brain acute infarct left PCA territory. There is acute infarct involving the left mid and posterior temporal lobe extending into the left occipital lobe as well acute infarct present left thalamus and left midbrain, left inferior cerebellum. MRA of the head negative for aneurysm. MRI of the neck with occlusions versus embolus versus dissection of the left posterior cerebral artery and left posterior inferior cerebellar artery compatible with acute infarct. Echocardiogram with ejection fraction of 55% no wall motion abnormalities and grade 1 diastolic dysfunction. Carotid Dopplers with no ICA stenosis. Patient did not receive TPA. Neurology services consulted presently on aspirin and Plavix for CVA prophylaxis as well as subcutaneous Lovenox for DVT prophylaxis. Presently maintained on a dysphagia 1 thin liquid diet. Hemoglobin A1c 10.9 Subjective/Complaints: Aphasic and dysarthric. Responds to cues to open mouth more while speaking  Objective: Vital Signs: Blood pressure 166/78, pulse 74, temperature 97.5 F (36.4 C), temperature source Oral, resp. rate 18, height 5\' 10"  (1.778 m), weight 82.6 kg (182 lb 1.6 oz), SpO2 99.00%. No results found. Results for orders placed during the hospital encounter of 06/09/13 (from the past 72 hour(s))  GLUCOSE, CAPILLARY     Status: Abnormal   Collection Time    06/09/13  4:12 PM      Result Value Range   Glucose-Capillary 272 (*) 70 - 99 mg/dL   Comment 1 Notify RN    CBC     Status: Abnormal   Collection Time    06/09/13  5:40 PM      Result Value Range   WBC 7.5  4.0 - 10.5 K/uL   RBC 4.49  4.22 - 5.81 MIL/uL   Hemoglobin 13.3  13.0 - 17.0 g/dL   HCT 16.1 (*) 09.6 - 04.5 %   MCV 82.6  78.0 - 100.0  fL   MCH 29.6  26.0 - 34.0 pg   MCHC 35.8  30.0 - 36.0 g/dL   RDW 40.9  81.1 - 91.4 %   Platelets 323  150 - 400 K/uL  CREATININE, SERUM     Status: None   Collection Time    06/09/13  5:40 PM      Result Value Range   Creatinine, Ser 0.63  0.50 - 1.35 mg/dL   GFR calc non Af Amer >90  >90 mL/min   GFR calc Af Amer >90  >90 mL/min   Comment: (NOTE)     The eGFR has been calculated using the CKD EPI equation.     This calculation has not been validated in all clinical situations.     eGFR's persistently <90 mL/min signify possible Chronic Kidney     Disease.  GLUCOSE, CAPILLARY     Status: Abnormal   Collection Time    06/09/13  8:58 PM      Result Value Range   Glucose-Capillary 294 (*) 70 - 99 mg/dL  CBC WITH DIFFERENTIAL     Status: Abnormal   Collection Time    06/10/13  5:30 AM      Result Value Range   WBC 7.1  4.0 - 10.5 K/uL   RBC 4.58  4.22 - 5.81 MIL/uL   Hemoglobin 13.3  13.0 - 17.0 g/dL   HCT 78.2 (*) 95.6 - 21.3 %   MCV 82.5  78.0 - 100.0 fL   MCH 29.0  26.0 - 34.0 pg   MCHC 35.2  30.0 - 36.0 g/dL   RDW 16.1  09.6 - 04.5 %   Platelets 314  150 - 400 K/uL   Neutrophils Relative % 57  43 - 77 %   Neutro Abs 4.0  1.7 - 7.7 K/uL   Lymphocytes Relative 31  12 - 46 %   Lymphs Abs 2.2  0.7 - 4.0 K/uL   Monocytes Relative 10  3 - 12 %   Monocytes Absolute 0.7  0.1 - 1.0 K/uL   Eosinophils Relative 3  0 - 5 %   Eosinophils Absolute 0.2  0.0 - 0.7 K/uL   Basophils Relative 0  0 - 1 %   Basophils Absolute 0.0  0.0 - 0.1 K/uL  COMPREHENSIVE METABOLIC PANEL     Status: Abnormal   Collection Time    06/10/13  5:30 AM      Result Value Range   Sodium 136  135 - 145 mEq/L   Potassium 3.9  3.5 - 5.1 mEq/L   Chloride 99  96 - 112 mEq/L   CO2 26  19 - 32 mEq/L   Glucose, Bld 214 (*) 70 - 99 mg/dL   BUN 7  6 - 23 mg/dL   Creatinine, Ser 4.09  0.50 - 1.35 mg/dL   Calcium 8.9  8.4 - 81.1 mg/dL   Total Protein 7.1  6.0 - 8.3 g/dL   Albumin 3.2 (*) 3.5 - 5.2 g/dL   AST  21  0 - 37 U/L   ALT 21  0 - 53 U/L   Alkaline Phosphatase 71  39 - 117 U/L   Total Bilirubin 0.4  0.3 - 1.2 mg/dL   GFR calc non Af Amer >90  >90 mL/min   GFR calc Af Amer >90  >90 mL/min   Comment: (NOTE)     The eGFR has been calculated using the CKD EPI equation.     This calculation has not been validated in all clinical situations.     eGFR's persistently <90 mL/min signify possible Chronic Kidney     Disease.  GLUCOSE, CAPILLARY     Status: Abnormal   Collection Time    06/10/13  7:27 AM      Result Value Range   Glucose-Capillary 227 (*) 70 - 99 mg/dL   Comment 1 Notify RN    GLUCOSE, CAPILLARY     Status: Abnormal   Collection Time    06/10/13 11:35 AM      Result Value Range   Glucose-Capillary 295 (*) 70 - 99 mg/dL  GLUCOSE, CAPILLARY     Status: Abnormal   Collection Time    06/10/13  4:58 PM      Result Value Range   Glucose-Capillary 302 (*) 70 - 99 mg/dL   Comment 1 Notify RN    GLUCOSE, CAPILLARY     Status: Abnormal   Collection Time    06/10/13  8:52 PM      Result Value Range   Glucose-Capillary 355 (*) 70 - 99 mg/dL     HEENT: normal Cardio: RRR and no murmur Resp: CTA B/L and unlabored GI: BS positive and NT, non distended Extremity:  Pulses positive and No Edema Skin:   Intact Neuro: Lethargic, Flat and Cranial Nerve Abnormalities decreased accomodation, R Delt bi tri grip trace, R HF,KE , ADF trace, Left side 4+/5,  Reduced sensation in RUE and RLE.  Speech with reduced output, strained quality, difficulty following commands Musc/Skel:  Normal Gen NAD   Assessment/Plan: 1. 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 Physiatrist is providing close team supervision and 24 hour management of active medical problems listed below. Physiatrist and rehab team continue to  assess barriers to discharge/monitor patient progress toward functional and medical goals. FIM: FIM - Bathing Bathing Steps Patient Completed: Chest;Abdomen;Front perineal area;Right upper leg;Left upper leg;Right Arm Bathing: 3: Mod-Patient completes 5-7 3f 10 parts or 50-74%  FIM - Upper Body Dressing/Undressing Upper body dressing/undressing: 0: Wears gown/pajamas-no public clothing FIM - Lower Body Dressing/Undressing Lower body dressing/undressing: 1: Total-Patient completed less than 25% of tasks  FIM - Toileting Toileting: 0: Activity did not occur  FIM - Diplomatic Services operational officer Devices: Grab bars Toilet Transfers: 2-To toilet/BSC: Max A (lift and lower assist);2-From toilet/BSC: Max A (lift and lower assist)  FIM - Bed/Chair Transfer Bed/Chair Transfer: 3: Supine > Sit: Mod A (lifting assist/Pt. 50-74%/lift 2 legs;2: Bed > Chair or W/C: Max A (lift and lower assist)  FIM - Locomotion: Wheelchair Distance: 150 feet Locomotion: Wheelchair: 3: Travels 150 ft or more: maneuvers on rugs and over door sills with moderate assistance  (Pt: 50 - 74%) FIM - Locomotion: Ambulation Locomotion: Ambulation Assistive Devices: Other (comment) (railing in hallway) Ambulation/Gait Assistance: 1: +2 Total assist Locomotion: Ambulation: 1: Two helpers  Comprehension Comprehension Mode: Auditory Comprehension: 3-Understands basic 50 - 74% of the time/requires cueing 25 - 50%  of the time  Expression Expression Mode: Verbal Expression Assistive Devices: 6-Other (Comment) (dysarthic and aphaisic) Expression: 3-Expresses basic 50 - 74% of the time/requires cueing 25 - 50% of the time. Needs to repeat parts of sentences.  Social Interaction Social Interaction: 2-Interacts appropriately 25 - 49% of time - Needs frequent redirection.  Problem Solving Problem Solving: 2-Solves basic 25 - 49% of the time - needs direction more than half the time to initiate, plan or complete  simple activities  Memory Memory: 2-Recognizes or recalls 25 - 49% of the time/requires cueing 51 - 75% of the time Medical Problem List and Plan:  1. Embolic left PCA infarct  2. DVT Prophylaxis/Anticoagulation: Subcutaneous Lovenox. Monitor platelet counts and any signs of bleeding  3. Pain Management: Tylenol as needed. Monitor with increased mobility  4. Neuropsych: This patient is not capable of making decisions on his own behalf.  5. Dysphagia/aphasia. Dysphagia 1 and liquids. Monitor for any signs of aspiration. Followup speech therapy.  6. Diabetes mellitus with peripheral neuropathy. Hemoglobin A1c 10.9. Lantus insulin increase to10 units subcutaneous daily. Check CBGs a.c. and at bedtime,SSI. Provide diabetic teaching.  7. Hyperlipidemia. Lipitor    LOS (Days) 2 A FACE TO FACE EVALUATION WAS PERFORMED  KIRSTEINS,ANDREW E 06/11/2013, 8:10 AM

## 2013-06-11 NOTE — Progress Notes (Signed)
Nutrition Brief Note  RD received consult for Diabetes Coordinator for "Pt will have family helping with daily care once pt is discharged. Please try to meet with pt and family member(s) who will be helping with meals."   Patient's ELOS for rehab is 18-20 days. Today is day 2 of his rehab admission, will complete diet education at date closer to d/c.  RD to continue to follow nutrition care plan. Full Nutrition Assessment completed by RD yesterday (11/20.)  Adam Motto MS, RD, LDN Pager: [phone] After-hours pager: [phone]

## 2013-06-11 NOTE — Progress Notes (Signed)
Speech Language Pathology Daily Session Note  Patient Details  Name: Adam Callahan MRN: [id number] Date of Birth: 04-09-1961  Today's Date: 06/11/2013 Time: 2440-1027 Time Calculation (min): 42 min  Short Term Goals: Week 1: SLP Short Term Goal 1 (Week 1): Pt will demonstrate effective mastication of Dys 2 textures with Min cues to assess readiness for advancement. SLP Short Term Goal 2 (Week 1): Pt will perform oral motor exercises with Max cues SLP Short Term Goal 3 (Week 1): Pt will use external aids to demonstrate orientation x4 with Max cues SLP Short Term Goal 4 (Week 1): Pt will name common objects with 80% accuracy with Max cues SLP Short Term Goal 5 (Week 1): Pt will identify at least 1 cognitive and 1 physical impairment with Max cues  Skilled Therapeutic Interventions: Skilled treatment session focused on addressing speech and dysphagia goals. Patient required Max faded to Mod verbal cues to utilize speech intelligibility strategies: slow rate, pausing between words and increased vocal intensity.  Patient consumed water via cup with Mod verbal cues for small controlled cup sips, which were effective at preventing overt s/s of aspiration.  Continue with current plan of care.    FIM:  Comprehension Comprehension Mode: Auditory Comprehension: 3-Understands basic 50 - 74% of the time/requires cueing 25 - 50%  of the time Expression Expression Mode: Verbal Expression: 2-Expresses basic 25 - 49% of the time/requires cueing 50 - 75% of the time. Uses single words/gestures. Social Interaction Social Interaction: 2-Interacts appropriately 25 - 49% of time - Needs frequent redirection. Problem Solving Problem Solving: 2-Solves basic 25 - 49% of the time - needs direction more than half the time to initiate, plan or complete simple activities Memory Memory: 2-Recognizes or recalls 25 - 49% of the time/requires cueing 51 - 75% of the time FIM - Eating Eating Activity: 4: Help with  managing cup/glass  Pain Pain Assessment Pain Assessment: 0-10 Pain Score: 4  Pain Type: Acute pain Pain Location: Calf Pain Orientation: Left Pain Descriptors / Indicators: Aching Pain Frequency: Rarely Pain Onset: Gradual Patients Stated Pain Goal: 3 Pain Intervention(s): Medication (See eMAR);Repositioned;Emotional support Multiple Pain Sites: No  Therapy/Group: Individual Therapy  Charlane Ferretti., CCC-SLP [phone]  Vadie Principato 06/11/2013, 4:21 PM

## 2013-06-11 NOTE — Progress Notes (Signed)
Inpatient Diabetes Program Recommendations  AACE/ADA: New Consensus Statement on Inpatient Glycemic Control (2013)  Target Ranges:  Prepandial:   less than 140 mg/dL      Peak postprandial:   less than 180 mg/dL (1-2 hours)      Critically ill patients:  140 - 180 mg/dL   Results for HODARI, CHUBA (MRN [id number]) as of 06/11/2013 12:12  Ref. Range 06/10/2013 07:27 06/10/2013 11:35 06/10/2013 16:58 06/10/2013 20:52 06/11/2013 07:34  Glucose-Capillary Latest Range: 70-99 mg/dL 409 (H) 811 (H) 914 (H) 355 (H) 256 (H)    Inpatient Diabetes Program Recommendations Insulin - Basal: Note Lantus was increased from 5 units daily to 10 units daily today. Correction (SSI): Please consider increasing Novolog correction to moderate scale and ADD Novolog bedtime correction scale.  Thanks, Orlando Penner, RN, MSN, CCRN Diabetes Coordinator Inpatient Diabetes Program [phone] (Team Pager) [phone] ([facility] office) [phone] [facility] office)

## 2013-06-11 NOTE — Progress Notes (Signed)
Occupational Therapy Session Note  Patient Details  Name: Adam Callahan MRN: [id number] Date of Birth: 20-Mar-1961  Today's Date: 06/11/2013 Time: 1035-1130 and 1300-1330 Time Calculation (min): 55 min and 30 min   Short Term Goals: Week 1:  OT Short Term Goal 1 (Week 1): Pt will retrieve 2 grooming items on right side with cues  OT Short Term Goal 2 (Week 1): Pt will complete toilet transfer with mod assist OT Short Term Goal 3 (Week 1): Pt will complete LB dressing with max assist OT Short Term Goal 4 (Week 1): Pt will complete UB dressing with max assist   Skilled Therapeutic Interventions/Progress Updates:    Session 1: Pt seen for ADL retraining with focus on visual scanning/tracking, attention to right, hemi dressing and sit<>stand. Pt received sitting in w/c. Completed bathing and dressing at sink with pt requiring min verbal and tactile cues for arousal. Pt required [facility] assist for washing LUE with RUE and tactile cues to wash RLE. Presented all items from right side with max cues to turn head to locate items and left eye not tracking past midline. Provided positioning of RLE for crossover technique when donning brief and socks. Pt required increased time for LB dressing and mild SOB. Required mod assist for sit<>stand therapist providing positioning of RUE on sink. At end of session pt very fatigued and requesting to return to bed. Completed squat pivot transfer w/c>bed with mod assist. Pt left supine in bed with pillow placed for proper positioning of RUE and all needs in reach. Bed alarm on.  Session 2: Pt received supine in bed with HOB flat and no pillows. Pt grimacing in pain and unable to verbalize location. Completed supine>sit with mod assist then pt pointed to lower LLE for sight of pain. Not warm to touch and therapist informed RN of pain. Completed squat pivot transfer bed>w/c with mod assist and min-mod assist for positioning in chair. Engaged in visual tracking task to focus  on attention to R while sitting in w/c. Presented pt with numbered plates and cued to verbalize number. Pt verbalized with phonemic cues approx 50% of time then perseverated on number he had spoken. Worked on visual tracking to reach for specific colored items requiring mod cues for head turn and pt locating items however eyes not tracking past midline. Completed both activities with therapist covering eye (simulate patch for double vision) and without covering eye. Pt reported covering his eye helped. Therapist asked for order of eye patch. At end of session pt returned to room and left with all items in reach and quick release belt donned.     Therapy Documentation Precautions:  Precautions Precautions: Fall Precaution Comments: right inattention Restrictions Weight Bearing Restrictions: No General:   Vital Signs:   Pain: Session 1: Pt reporting headache and RN notified.  Session 2: Pt received grimacing in pain and grabbed lower LLE when asked for location of pain. Not warm to touch and RN informed.   See FIM for current functional status  Therapy/Group: Individual Therapy  Daneil Dan 06/11/2013, 3:17 PM

## 2013-06-11 NOTE — Progress Notes (Signed)
Physical Therapy Session Note  Patient Details  Name: Adam Callahan MRN: [id number] Date of Birth: 03/02/1961  Today's Date: 06/11/2013 Time: 0830-0930 Time Calculation (min): 60 min  Short Term Goals: Week 1:  PT Short Term Goal 1 (Week 1): Patient will perform supine to sit with min assist. PT Short Term Goal 2 (Week 1): Patient will perform dynamic sitting activities edge of mat with  supervision. PT Short Term Goal 3 (Week 1): Patient will propell wheelchair 150 feet in coltrolled environment with min verbal cueing to attend to right. PT Short Term Goal 4 (Week 1): Patient will transfer wheelchair <> bed with mod assist.  Skilled Therapeutic Interventions/Progress Updates:    Patient received supine in bed, lethargic, but agreeable to therapy. Session focused on functional transfers, attention to the R, postural control, and cognitive-linguistic tasks; see details below. During activity, patient able to attend to R side, but requires min-mod verbal and visual cues to do so. Eyes delayed in tracking to the R. Patient performed bed>wheelchair and wheelchair<>mat squat pivot transfers with modA. Mirror placed in front of patient when sitting edge of mat for visual feedback for posture. Patient returned to room and left seated in wheelchair with seatbelt donned and all needs within reach.  Therapy Documentation Precautions:  Precautions Precautions: Fall Precaution Comments: right inattention Restrictions Weight Bearing Restrictions: No Pain: Pain Assessment Pain Assessment: 0-10 Pain Score: 5  Pain Type: Acute pain Pain Location: Neck Pain Orientation: Left Locomotion : Ambulation Ambulation/Gait Assistance: Not tested (comment)   Balance: Balance Balance Assessed: Yes Dynamic Sitting Balance Dynamic Sitting - Balance Support: No upper extremity supported;Feet supported Dynamic Sitting - Level of Assistance: 4: Min assist;3: Mod assist;5: Stand by assistance Dynamic  Sitting - Balance Activities: Lateral lean/weight shifting;Forward lean/weight shifting;Reaching for objects;Reaching across midline Dynamic Sitting - Comments: Patient performed dynamic sitting balance activities to facilitate improved trunk control with dual cognitive-linguistic task with emphasis on attention to the R. Patient reached for 9 colored rings and 7 numbered cards positioned on R side and was instructed to state color of ring or number on card then reach for it and place it on a table in front of him. Performed all colored rings first, then all numbered cards. Patient requires min-modA when reaching to the R side for balance. Patient requires less cues for identifying colors. Patient usually stating wrong color or number first, when given cues that he is incorrect, able to state correct color 75% of the time. Patient requires increased phonetic cues for identifying numbers. Patient able to identify 4/7 numbers correctly on first attempt.  See FIM for current functional status  Therapy/Group: Individual Therapy  Chipper Herb. Odell Fasching, PT, DPT  06/11/2013, 12:00 PM

## 2013-06-11 NOTE — Plan of Care (Signed)
Problem: RH SAFETY Goal: RH STG ADHERE TO SAFETY PRECAUTIONS W/ASSISTANCE/DEVICE STG Adhere to Safety Precautions With min Assistance/Device.  Outcome: Progressing No unsafe behavior exhibited

## 2013-06-11 NOTE — Plan of Care (Signed)
Problem: RH SKIN INTEGRITY Goal: RH STG MAINTAIN SKIN INTEGRITY WITH ASSISTANCE STG Maintain Skin Integrity With min Assistance.  Outcome: Progressing Incision healing

## 2013-06-12 ENCOUNTER — Inpatient Hospital Stay (HOSPITAL_COMMUNITY): Payer: BC Managed Care – PPO | Admitting: *Deleted

## 2013-06-12 ENCOUNTER — Inpatient Hospital Stay (HOSPITAL_COMMUNITY): Payer: BC Managed Care – PPO | Admitting: Speech Pathology

## 2013-06-12 DIAGNOSIS — I6350 Cerebral infarction due to unspecified occlusion or stenosis of unspecified cerebral artery: Secondary | ICD-10-CM

## 2013-06-12 LAB — GLUCOSE, CAPILLARY
Glucose-Capillary: 277 mg/dL — ABNORMAL HIGH (ref 70–99)
Glucose-Capillary: 232 mg/dL — ABNORMAL HIGH (ref 70–99)

## 2013-06-12 MED ORDER — INSULIN ASPART 100 UNIT/ML ~~LOC~~ SOLN
10.0000 [IU] | Freq: Once | SUBCUTANEOUS | Status: AC
  Administered 2013-06-12: 10 [IU] via SUBCUTANEOUS

## 2013-06-12 MED ORDER — INSULIN ASPART 100 UNIT/ML ~~LOC~~ SOLN
0.0000 [IU] | Freq: Three times a day (TID) | SUBCUTANEOUS | Status: DC
  Administered 2013-06-12: 5 [IU] via SUBCUTANEOUS
  Administered 2013-06-13: 8 [IU] via SUBCUTANEOUS
  Administered 2013-06-13: 5 [IU] via SUBCUTANEOUS
  Administered 2013-06-13: 8 [IU] via SUBCUTANEOUS
  Administered 2013-06-14: 11 [IU] via SUBCUTANEOUS
  Administered 2013-06-14 (×2): 5 [IU] via SUBCUTANEOUS
  Administered 2013-06-15: 11 [IU] via SUBCUTANEOUS
  Administered 2013-06-15 (×2): 5 [IU] via SUBCUTANEOUS
  Administered 2013-06-16 (×2): 8 [IU] via SUBCUTANEOUS
  Administered 2013-06-17: 2 [IU] via SUBCUTANEOUS
  Administered 2013-06-17: 8 [IU] via SUBCUTANEOUS
  Administered 2013-06-17: 11 [IU] via SUBCUTANEOUS
  Administered 2013-06-18: 3 [IU] via SUBCUTANEOUS
  Administered 2013-06-18: 2 [IU] via SUBCUTANEOUS
  Administered 2013-06-18: 8 [IU] via SUBCUTANEOUS
  Administered 2013-06-19: 3 [IU] via SUBCUTANEOUS
  Administered 2013-06-19: 5 [IU] via SUBCUTANEOUS
  Administered 2013-06-19 – 2013-06-20 (×2): 3 [IU] via SUBCUTANEOUS
  Administered 2013-06-20 (×2): 2 [IU] via SUBCUTANEOUS
  Administered 2013-06-21: 3 [IU] via SUBCUTANEOUS
  Administered 2013-06-21: 2 [IU] via SUBCUTANEOUS
  Administered 2013-06-21: 3 [IU] via SUBCUTANEOUS
  Administered 2013-06-22: 5 [IU] via SUBCUTANEOUS
  Administered 2013-06-22: 2 [IU] via SUBCUTANEOUS
  Administered 2013-06-23 (×2): 3 [IU] via SUBCUTANEOUS
  Administered 2013-06-24 (×3): 2 [IU] via SUBCUTANEOUS
  Administered 2013-06-25: 3 [IU] via SUBCUTANEOUS
  Administered 2013-06-25 – 2013-06-26 (×3): 2 [IU] via SUBCUTANEOUS
  Administered 2013-06-27: 3 [IU] via SUBCUTANEOUS
  Administered 2013-06-28: 5 [IU] via SUBCUTANEOUS
  Administered 2013-06-28: 3 [IU] via SUBCUTANEOUS
  Administered 2013-06-29: 2 [IU] via SUBCUTANEOUS

## 2013-06-12 NOTE — Progress Notes (Signed)
Physical Therapy Session Note  Patient Details  Name: Adam Callahan MRN: [id number] Date of Birth: 1961-03-11  Today's Date: 06/12/2013 Time: 1415-1500 Time Calculation (min): 45 min   Skilled Therapeutic Interventions/Progress Updates:  Patient is sitting in a w/c with safety belt on,w/c propulsion with L UE-with max verbal cues and manual facilitation and path correction. Standing frame 1 x 10 min with correction of posture and head control exercises. Transfer training w/c<=>mat with max A of 1.Sitting unsupported edge of mat, weight shifting to R and L and returning to center -max A to maintain sitting and to weight shift. In II bars 4 x sit to stand with max A , standing with R knee manually locked by therapist. Patient able to correct his posture with cues.  Patient returned to his room,sitting in a w/c with quick release belt on ,all needs within reach .  During this session patient did not report or show signs of pain,.  Therapy Documentation Precautions:  Precautions Precautions: Fall Precaution Comments: right inattention Restrictions Weight Bearing Restrictions: No Pain: Pain Assessment Pain Assessment: 0-10 Pain Score: 10-Worst pain ever Faces Pain Scale: Hurts little more Pain Type: Acute pain Pain Location: Head Pain Orientation: Left Pain Descriptors / Indicators: Aching Pain Frequency: Intermittent Pain Intervention(s): Medication (See eMAR) Multiple Pain Sites: No  See FIM for current functional status  Therapy/Group: Individual Therapy  Dorna Mai 06/12/2013, 3:53 PM

## 2013-06-12 NOTE — Progress Notes (Signed)
52 y.o. right-handed male with history of diabetes mellitus with peripheral neuropathy. Patient was independent prior to admission driving . Admitted 06/06/2013 with acute right-sided weakness and aphasia after being found on the floor by his family. MRI of the brain acute infarct left PCA territory. There is acute infarct involving the left mid and posterior temporal lobe extending into the left occipital lobe as well acute infarct present left thalamus and left midbrain, left inferior cerebellum. MRA of the head negative for aneurysm. MRI of the neck with occlusions versus embolus versus dissection of the left posterior cerebral artery and left posterior inferior cerebellar artery compatible with acute infarct. Echocardiogram with ejection fraction of 55% no wall motion abnormalities and grade 1 diastolic dysfunction. Carotid Dopplers with no ICA stenosis. Patient did not receive TPA. Neurology services consulted presently on aspirin and Plavix for CVA prophylaxis as well as subcutaneous Lovenox for DVT prophylaxis. Presently maintained on a dysphagia 1 thin liquid diet. Hemoglobin A1c 10.9 Subjective/Complaints:  Patient has difficulty speaking. He seems to answer yes to all questions. He may have some discomfort in the back of his neck but that's difficult to ascertain with his speech difficulties.  Objective: Vital Signs: Blood pressure 152/82, pulse 90, temperature 97.3 F (36.3 C), temperature source Oral, resp. rate 18, height 5\' 10"  (1.778 m), weight 182 lb 1.6 oz (82.6 kg), SpO2 97.00%.  CBG (last 3)   Recent Labs  06/11/13 1646 06/11/13 2025 06/12/13 0750  GLUCAP 262* 325* 277*    Overweight male in no acute distress. Chest clear to auscultation. Cardiac exam S1-S2 are regular. Abdominal exam active bowel sounds, soft, overweight. Extremities no edema. Neurologic exam he is alert  Assessment/Plan: 1. Functional deficits secondary to Left PCA thrombotic infarct with R HP, R  homonymous hemianopsia, R hemisensory deficits   Medical Problem List and Plan:  1. Embolic left PCA infarct  2. DVT Prophylaxis/Anticoagulation: Subcutaneous Lovenox. Monitor platelet counts and any signs of bleeding  3. Pain Management: Tylenol as needed. Monitor with increased mobility  4. Neuropsych: This patient is not capable of making decisions on his own behalf.  5. Dysphagia/aphasia. Dysphagia 1 and liquids. Monitor for any signs of aspiration. Followup speech therapy.  6. Diabetes mellitus with peripheral neuropathy. Hemoglobin A1c 10.9. Lantus insulin increase to10 units subcutaneous daily. Will increase sliding scale insulin.  7. Hyperlipidemia. Lipitor    LOS (Days) 3 A FACE TO FACE EVALUATION WAS PERFORMED  SWORDS,BRUCE HENRY 06/12/2013, 9:35 AM

## 2013-06-12 NOTE — Progress Notes (Signed)
Speech Language Pathology Daily Session Note  Patient Details  Name: Adam Callahan MRN: [id number] Date of Birth: 11-Aug-1960  Today's Date: 06/12/2013 Time: 4098-1191 Time Calculation (min): 44 min  Short Term Goals: Week 1: SLP Short Term Goal 1 (Week 1): Pt will demonstrate effective mastication of Dys 2 textures with Min cues to assess readiness for advancement. SLP Short Term Goal 2 (Week 1): Pt will perform oral motor exercises with Max cues SLP Short Term Goal 3 (Week 1): Pt will use external aids to demonstrate orientation x4 with Max cues SLP Short Term Goal 4 (Week 1): Pt will name common objects with 80% accuracy with Max cues SLP Short Term Goal 5 (Week 1): Pt will identify at least 1 cognitive and 1 physical impairment with Max cues  Skilled Therapeutic Interventions: Skilled ST intervention provided with focus on dysphagia and cognitive goals. Pt reported pain in head, RN notified.  Slp repositioned pt in bed to upright 90 degrees. Slp provided verbal and visual cues to utilize safe swallow strategies throughout meal. Pt responded well to cueing, requiring occasional hand over hand assist with cup to ensure small sips. Pt noted with mild oral stasis on right.  Pt noted with no overt s/s of aspiration with puree and thin liquids. Pt consumed 75% of meal. Pt was oriented x3. Slp reviewed compensatory speech strategies, providing examples of each, pt expressed understanding. Pt spoke on phone with relative during therapy session, requiring verbal cueing to slow speech and say 1 word at a time. Pt able to identify 1 cognitive impairment as result of stroke.    FIM:  Comprehension Comprehension Mode: Auditory Comprehension: 3-Understands basic 50 - 74% of the time/requires cueing 25 - 50%  of the time Expression Expression Mode: Verbal Expression: 2-Expresses basic 25 - 49% of the time/requires cueing 50 - 75% of the time. Uses single words/gestures. Social Interaction Social  Interaction: 2-Interacts appropriately 25 - 49% of time - Needs frequent redirection. Problem Solving Problem Solving: 2-Solves basic 25 - 49% of the time - needs direction more than half the time to initiate, plan or complete simple activities Memory Memory: 2-Recognizes or recalls 25 - 49% of the time/requires cueing 51 - 75% of the time FIM - Eating Eating Activity: 4: Helper checks for pocketed food;5: Needs verbal cues/supervision;4: Help with managing cup/glass  Pain Pain Assessment Pain Assessment: Faces Pain Score: 9  Faces Pain Scale: Hurts little more Pain Type: Acute pain Pain Location: Head Pain Orientation: Left Pain Descriptors / Indicators: Aching Pain Frequency: Intermittent Pain Intervention(s): Medication (See eMAR);RN made aware Multiple Pain Sites: No  Therapy/Group: Individual Therapy  Ozil Stettler, Kara Pacer 06/12/2013, 12:15 PM

## 2013-06-13 ENCOUNTER — Inpatient Hospital Stay (HOSPITAL_COMMUNITY): Payer: BC Managed Care – PPO | Admitting: Physical Therapy

## 2013-06-13 ENCOUNTER — Encounter (HOSPITAL_COMMUNITY): Payer: Self-pay | Admitting: Occupational Therapy

## 2013-06-13 ENCOUNTER — Inpatient Hospital Stay (HOSPITAL_COMMUNITY): Payer: BC Managed Care – PPO | Admitting: *Deleted

## 2013-06-13 LAB — GLUCOSE, CAPILLARY
Glucose-Capillary: 281 mg/dL — ABNORMAL HIGH (ref 70–99)
Glucose-Capillary: 268 mg/dL — ABNORMAL HIGH (ref 70–99)
Glucose-Capillary: 312 mg/dL — ABNORMAL HIGH (ref 70–99)
Glucose-Capillary: 412 mg/dL — ABNORMAL HIGH (ref 70–99)
Glucose-Capillary: 488 mg/dL — ABNORMAL HIGH (ref 70–99)

## 2013-06-13 MED ORDER — INSULIN ASPART 100 UNIT/ML ~~LOC~~ SOLN
2.0000 [IU] | Freq: Once | SUBCUTANEOUS | Status: AC
  Administered 2013-06-13: 2 [IU] via SUBCUTANEOUS

## 2013-06-13 MED ORDER — INSULIN GLARGINE 100 UNIT/ML ~~LOC~~ SOLN
20.0000 [IU] | Freq: Every day | SUBCUTANEOUS | Status: DC
  Administered 2013-06-14: 20 [IU] via SUBCUTANEOUS
  Filled 2013-06-13 (×2): qty 0.2

## 2013-06-13 NOTE — Progress Notes (Signed)
52 y.o. right-handed male with history of diabetes mellitus with peripheral neuropathy. Patient was independent prior to admission driving . Admitted 06/06/2013 with acute right-sided weakness and aphasia after being found on the floor by his family. MRI of the brain acute infarct left PCA territory. There is acute infarct involving the left mid and posterior temporal lobe extending into the left occipital lobe as well acute infarct present left thalamus and left midbrain, left inferior cerebellum. MRA of the head negative for aneurysm. MRI of the neck with occlusions versus embolus versus dissection of the left posterior cerebral artery and left posterior inferior cerebellar artery compatible with acute infarct. Echocardiogram with ejection fraction of 55% no wall motion abnormalities and grade 1 diastolic dysfunction. Carotid Dopplers with no ICA stenosis. Patient did not receive TPA. Neurology services consulted presently on aspirin and Plavix for CVA prophylaxis as well as subcutaneous Lovenox for DVT prophylaxis. Presently maintained on a dysphagia 1 thin liquid diet. Hemoglobin A1c 10.9 Subjective/Complaints:  No neck pain, Denies pain or other complaints Has trouble forming words  Objective: Vital Signs: Blood pressure 122/75, pulse 75, temperature 98 F (36.7 C), temperature source Oral, resp. rate 18, height 5\' 10"  (1.778 m), weight 182 lb 1.6 oz (82.6 kg), SpO2 100.00%.  CBG (last 3)   Recent Labs  06/12/13 2033 06/13/13 0744 06/13/13 0900  GLUCAP 231* 215* 281*    Overweight male in no acute distress. Chest clear to auscultation. Cardiac exam S1-S2 are regular. Abdominal exam active bowel sounds, soft, overweight. Extremities no edema. Neurologic exam he is alert  Assessment/Plan: 1. 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 Embolic left PCA infarct  2. DVT  Prophylaxis/Anticoagulation: Subcutaneous Lovenox. Monitor platelet counts and any signs of bleeding  3. Pain Management: Tylenol as needed. Monitor with increased mobility  4. Neuropsych: This patient is not capable of making decisions on his own behalf.  5. Dysphagia/aphasia. Dysphagia 1 and liquids. Monitor for any signs of aspiration. Followup speech therapy.  6. Diabetes mellitus with peripheral neuropathy. Hemoglobin A1c 10.9. Lantus insulin increase to10 units subcutaneous daily. Increase levemir 7. Hyperlipidemia. Lipitor    LOS (Days) 4 A FACE TO FACE EVALUATION WAS PERFORMED  Adam Callahan 06/13/2013, 9:46 AM

## 2013-06-13 NOTE — Progress Notes (Signed)
Occupational Therapy Note Patient Details  Name: Adam Callahan MRN: [id number] Date of Birth: [birth year] Today's Date: 06/13/2013  Time: 0915-1000  (45 min) Pain:  Slight headache  3/10 Individual session  Engaged in attending to right, hemi bathing and dressing strategies, sit to stand, standing with upright posture, postural control, standing balance.  Did bathing and dressing sink.  Pt.  Washed right arm with minimal assist.  He washed face and anterior chest.  Donned shirt with left arm, neck.  Stood for 1 minute x3 during peri care and donning pants. Assisted pt with bathing dry feet and applying lotion.   Pt. Sleepy at end of session.  Applied safety belt and took pt to Nursing station.      Humberto Seals 06/13/2013, 9:26 AM

## 2013-06-13 NOTE — Progress Notes (Addendum)
Physical Therapy Note  Patient Details  Name: Adam Callahan MRN: [id number] Date of Birth: [birth year] Today's Date: 06/13/2013  Time: 800-855 55 minutes  1:1 Pt c/o headache, RN made aware.  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  Squat pivot transfers multiple attempts for motor planning with mod A, max cuing.  Sit to stand training with focus on R LE wt bearing and mm initiation as well as upright trunk posture, pt mod A to stand, mod A standing balance with wt shifts, tactile cues for trunk and R knee extension.  Attempted pre gait stepping, pt unable to fully lift L LE due to weakness of R LE.  Attempted stair negotiation for automatic task to reduce apraxia, pt unable to motor plan and sequence stair negotiation, total A for balance.  Supine NMR bridging with max tactile cues, LTR with max tactile cues for coordination.  Kinetron for reciprocal movements of LEs with min facilitation for R LE, improved reciprocal movements with this activity.  W/c mobility with mod A for steering, manual facilitation initially to use L LE, then pt able to propel with L LE only which improved steering and safety, still total A for R attention.   Time 2: 1345-1415 30 minutes  1:1 Pt c/o stomach pain, RN made aware.  W/c mobility with L LE only with improved steering and improved attention to R with mod cues.  Sit to stands and standing balance focusing on use of R LE, standing balance/postural alignment with manual facilitation at hips and trunk for control, decreasing trunk rotation and promoting trunk extension.  Pt limited by difficulty grading movements and fatigue.  [name] 06/13/2013, 8:55 AM

## 2013-06-13 NOTE — Progress Notes (Signed)
Occupational Therapy Session Note  Patient Details  Name: Adam Callahan MRN: [id number] Date of Birth: 1960-09-04  Today's Date: 06/13/2013 Time: 1100-1200 Time Calculation (min): 60 min  Short Term Goals: Week 1:  OT Short Term Goal 1 (Week 1): Pt will retrieve 2 grooming items on right side with cues  OT Short Term Goal 2 (Week 1): Pt will complete toilet transfer with mod assist OT Short Term Goal 3 (Week 1): Pt will complete LB dressing with max assist OT Short Term Goal 4 (Week 1): Pt will complete UB dressing with max assist  Week 2:     Skilled Therapeutic Interventions/Progress Updates:    1:1 Neuromuscular reeducation with focus on squat pivot transfers, sit to stands with mirror for visual feedback, standing balance with decreased trunk rotation and facilitation for right hip extension and knee extension, transition from sit to supine, rolling on mat with trunk rotation, facilitation of right UE movement  In supine and in sidelying, and bridging in supine addressing control and hip control. Pt able to illicit active movement in right UE in supine at the shoulder and bicep with multiple opportunities. 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   Therapy Documentation Precautions:  Precautions Precautions: Fall Precaution Comments: right inattention Restrictions Weight Bearing Restrictions: No Pain: No c/ o pain  See FIM for current functional status  Therapy/Group: Individual Therapy  Roney Mans [facility] 06/13/2013, 12:43 PM

## 2013-06-13 NOTE — Progress Notes (Signed)
Patient refusing to wear CPAP tonight.  Patient in bathroom when RT arrived to apply CPAP, RN in room told patient of my purpose, patient communicated to RN his desire to not wear.

## 2013-06-14 ENCOUNTER — Inpatient Hospital Stay (HOSPITAL_COMMUNITY): Payer: Self-pay

## 2013-06-14 ENCOUNTER — Inpatient Hospital Stay (HOSPITAL_COMMUNITY): Payer: BC Managed Care – PPO

## 2013-06-14 ENCOUNTER — Inpatient Hospital Stay (HOSPITAL_COMMUNITY): Payer: BC Managed Care – PPO | Admitting: Rehabilitation

## 2013-06-14 ENCOUNTER — Inpatient Hospital Stay (HOSPITAL_COMMUNITY): Payer: BC Managed Care – PPO | Admitting: Physical Therapy

## 2013-06-14 DIAGNOSIS — I6330 Cerebral infarction due to thrombosis of unspecified cerebral artery: Secondary | ICD-10-CM

## 2013-06-14 DIAGNOSIS — G8110 Spastic hemiplegia affecting unspecified side: Secondary | ICD-10-CM

## 2013-06-14 DIAGNOSIS — I69921 Dysphasia following unspecified cerebrovascular disease: Secondary | ICD-10-CM

## 2013-06-14 LAB — GLUCOSE, CAPILLARY
Glucose-Capillary: 240 mg/dL — ABNORMAL HIGH (ref 70–99)
Glucose-Capillary: 362 mg/dL — ABNORMAL HIGH (ref 70–99)
Glucose-Capillary: 226 mg/dL — ABNORMAL HIGH (ref 70–99)

## 2013-06-14 MED ORDER — BISACODYL 10 MG RE SUPP
10.0000 mg | Freq: Once | RECTAL | Status: AC
  Administered 2013-06-14: 10 mg via RECTAL
  Filled 2013-06-14: qty 1

## 2013-06-14 NOTE — Progress Notes (Signed)
52 y.o. right-handed male with history of diabetes mellitus with peripheral neuropathy. Patient was independent prior to admission driving . Admitted 06/06/2013 with acute right-sided weakness and aphasia after being found on the floor by his family. MRI of the brain acute infarct left PCA territory. There is acute infarct involving the left mid and posterior temporal lobe extending into the left occipital lobe as well acute infarct present left thalamus and left midbrain, left inferior cerebellum. MRA of the head negative for aneurysm. MRI of the neck with occlusions versus embolus versus dissection of the left posterior cerebral artery and left posterior inferior cerebellar artery compatible with acute infarct. Echocardiogram with ejection fraction of 55% no wall motion abnormalities and grade 1 diastolic dysfunction. Carotid Dopplers with no ICA stenosis. Patient did not receive TPA. Neurology services consulted presently on aspirin and Plavix for CVA prophylaxis as well as subcutaneous Lovenox for DVT prophylaxis. Presently maintained on a dysphagia 1 thin liquid diet. Hemoglobin A1c 10.9 Subjective/Complaints: Aphasic and dysarthric.  Sitting in chair in no distress  Poor awareness of deficits  Review of systems: Cannot obtain secondary to mental status  Objective: Vital Signs: Blood pressure 132/78, pulse 88, temperature 97.9 F (36.6 C), temperature source Oral, resp. rate 18, height 5\' 10"  (1.778 m), weight 82.6 kg (182 lb 1.6 oz), SpO2 95.00%. No results found. Results for orders placed during the hospital encounter of 06/09/13 (from the past 72 hour(s))  GLUCOSE, CAPILLARY     Status: Abnormal   Collection Time    06/11/13 11:23 AM      Result Value Range   Glucose-Capillary 347 (*) 70 - 99 mg/dL   Comment 1 Notify RN    GLUCOSE, CAPILLARY     Status: Abnormal   Collection Time    06/11/13  4:46 PM      Result Value Range   Glucose-Capillary 262 (*) 70 - 99 mg/dL   Comment 1  Notify RN    GLUCOSE, CAPILLARY     Status: Abnormal   Collection Time    06/11/13  8:25 PM      Result Value Range   Glucose-Capillary 325 (*) 70 - 99 mg/dL   Comment 1 Notify RN    GLUCOSE, CAPILLARY     Status: Abnormal   Collection Time    06/12/13  7:50 AM      Result Value Range   Glucose-Capillary 277 (*) 70 - 99 mg/dL  GLUCOSE, CAPILLARY     Status: Abnormal   Collection Time    06/12/13 12:48 PM      Result Value Range   Glucose-Capillary 369 (*) 70 - 99 mg/dL  GLUCOSE, CAPILLARY     Status: Abnormal   Collection Time    06/12/13  4:43 PM      Result Value Range   Glucose-Capillary 232 (*) 70 - 99 mg/dL  GLUCOSE, CAPILLARY     Status: Abnormal   Collection Time    06/12/13  8:33 PM      Result Value Range   Glucose-Capillary 231 (*) 70 - 99 mg/dL  GLUCOSE, CAPILLARY     Status: Abnormal   Collection Time    06/13/13  7:44 AM      Result Value Range   Glucose-Capillary 215 (*) 70 - 99 mg/dL  GLUCOSE, CAPILLARY     Status: Abnormal   Collection Time    06/13/13  9:00 AM      Result Value Range   Glucose-Capillary 281 (*) 70 - 99  mg/dL   Comment 1 Documented in Chart    GLUCOSE, CAPILLARY     Status: Abnormal   Collection Time    06/13/13 12:19 PM      Result Value Range   Glucose-Capillary 284 (*) 70 - 99 mg/dL  GLUCOSE, CAPILLARY     Status: Abnormal   Collection Time    06/13/13  4:25 PM      Result Value Range   Glucose-Capillary 268 (*) 70 - 99 mg/dL   Comment 1 Documented in Chart    GLUCOSE, CAPILLARY     Status: Abnormal   Collection Time    06/13/13  5:05 PM      Result Value Range   Glucose-Capillary 312 (*) 70 - 99 mg/dL  GLUCOSE, CAPILLARY     Status: Abnormal   Collection Time    06/13/13  8:30 PM      Result Value Range   Glucose-Capillary 488 (*) 70 - 99 mg/dL  GLUCOSE, CAPILLARY     Status: Abnormal   Collection Time    06/13/13  8:38 PM      Result Value Range   Glucose-Capillary 412 (*) 70 - 99 mg/dL  GLUCOSE, CAPILLARY      Status: Abnormal   Collection Time    06/14/13  3:30 AM      Result Value Range   Glucose-Capillary 240 (*) 70 - 99 mg/dL  GLUCOSE, CAPILLARY     Status: Abnormal   Collection Time    06/14/13  7:21 AM      Result Value Range   Glucose-Capillary 223 (*) 70 - 99 mg/dL     HEENT: normal Cardio: RRR and no murmur Resp: CTA B/L and unlabored GI: BS positive and NT, non distended Extremity:  Pulses positive and No Edema Skin:   Intact Neuro: Lethargic, Flat and Cranial Nerve Abnormalities decreased accomodation, R Delt bi tri grip trace, R HF,KE , ADF trace, Left side 4+/5,  Reduced sensation in RUE and RLE.  Speech with reduced output, strained quality, difficulty following commands Musc/Skel:  Normal Gen NAD   Assessment/Plan: 1. 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 Physiatrist is providing close team supervision and 24 hour management of active medical problems listed below. Physiatrist and rehab team continue to assess barriers to discharge/monitor patient progress toward functional and medical goals. FIM: FIM - Bathing Bathing Steps Patient Completed: Chest;Abdomen;Front perineal area;Right upper leg;Left upper leg;Right Arm Bathing: 3: Mod-Patient completes 5-7 11f 10 parts or 50-74%  FIM - Upper Body Dressing/Undressing Upper body dressing/undressing steps patient completed: Thread/unthread left sleeve of pullover shirt/dress;Put head through opening of pull over shirt/dress Upper body dressing/undressing: 2: Max-Patient completed 25-49% of tasks FIM - Lower Body Dressing/Undressing Lower body dressing/undressing steps patient completed: Thread/unthread left pants leg Lower body dressing/undressing: 1: Total-Patient completed less than 25% of tasks  FIM - Toileting Toileting: 0: Activity did  not occur  FIM - Diplomatic Services operational officer Devices: Grab bars Toilet Transfers: 0-Activity did not occur  FIM - Banker Devices: Arm rests Bed/Chair Transfer: 2: Bed > Chair or W/C: Max A (lift and lower assist);3: Chair or W/C > Bed: Mod A (lift or lower assist)  FIM - Locomotion: Wheelchair Distance: 150 Locomotion: Wheelchair: 2: Travels 150 ft or more: maneuvers on rugs and over door sills with maximal  assistance (Pt: 25 - 49%) FIM - Locomotion: Ambulation Locomotion: Ambulation Assistive Devices: Other (comment) (railing in hallway) Ambulation/Gait Assistance: Not tested (comment) Locomotion: Ambulation: 0: Activity did not occur  Comprehension Comprehension Mode: Auditory Comprehension: 3-Understands basic 50 - 74% of the time/requires cueing 25 - 50%  of the time  Expression Expression Mode: Verbal Expression Assistive Devices: 6-Other (Comment) (dysarthic and aphaisic) Expression: 3-Expresses basic 50 - 74% of the time/requires cueing 25 - 50% of the time. Needs to repeat parts of sentences.  Social Interaction Social Interaction: 2-Interacts appropriately 25 - 49% of time - Needs frequent redirection.  Problem Solving Problem Solving: 2-Solves basic 25 - 49% of the time - needs direction more than half the time to initiate, plan or complete simple activities  Memory Memory: 2-Recognizes or recalls 25 - 49% of the time/requires cueing 51 - 75% of the time Medical Problem List and Plan:  1. Embolic left PCA infarct, right hemiparesis, right hemisensory deficits, right homonymous hemianopsia  2. DVT Prophylaxis/Anticoagulation: Subcutaneous Lovenox. Monitor platelet counts and any signs of bleeding  3. Pain Management: Tylenol as needed. Monitor with increased mobility  4. Neuropsych: This patient is not capable of making decisions on his own behalf.  5. Dysphagia/aphasia. Dysphagia 1 and liquids. Monitor for any  signs of aspiration. Followup speech therapy.  6. Diabetes mellitus with peripheral neuropathy, uncontrolled. Hemoglobin A1c 10.9. Lantus insulin increase to10 units subcutaneous daily. Check CBGs a.c. and at bedtime,SSI. Provide diabetic teaching.  7. Hyperlipidemia. Lipitor    LOS (Days) 5 A FACE TO FACE EVALUATION WAS PERFORMED  KIRSTEINS,ANDREW E 06/14/2013, 10:32 AM

## 2013-06-14 NOTE — Progress Notes (Signed)
Physical Therapy Session Note  Patient Details  Name: Adam Callahan MRN: [id number] Date of Birth: January 22, 1961  Today's Date: 06/14/2013 Time: 1302-1328 Time Calculation (min): 26 min  Short Term Goals: Week 1:  PT Short Term Goal 1 (Week 1): Patient will perform supine to sit with min assist. PT Short Term Goal 2 (Week 1): Patient will perform dynamic sitting activities edge of mat with  supervision. PT Short Term Goal 3 (Week 1): Patient will propell wheelchair 150 feet in coltrolled environment with min verbal cueing to attend to right. PT Short Term Goal 4 (Week 1): Patient will transfer wheelchair <> bed with mod assist.  Skilled Therapeutic Interventions/Progress Updates:   Pt received sitting at nursing station, reading newspaper and agreeable to therapy.  Performed >150' w/c mobility at min to mod assist to avoid obstacles on R side.  Max cues to attend to R side throughout session and performed w/c mobility over controlled and carpeted environment.  Remainder of session focused bed mobility and transfers.  Performs squat pivot x 2 reps (w/c <> mat) with arm rests at min assist with mod cues for safety and hand placement.  Provided some assist for steadying at trunk and at R knee, however does very well with transfers.  Note he is somewhat impulsive during mobility and during supine <> sit x 2 reps requires mod assist for assisting RLE into bed and also to assist trunk in correct direction, as he tends to fall backwards or attempt to lie down in opposite direction.  Also requires max cues and assist to attend to RLE/UE during bed mobility, however did note on last rep of supine to sit, he utilized LLE to assist RLE out of bed.  Pt left in w/c with quick release belt donned at nursing station for increased safety.    Therapy Documentation Precautions:  Precautions Precautions: Fall Precaution Comments: right inattention Restrictions Weight Bearing Restrictions: No    Pain: Pt  with no pain this afternoon.  See FIM for current functional status  Therapy/Group: Individual Therapy  Vista Deck 06/14/2013, 2:25 PM

## 2013-06-14 NOTE — Progress Notes (Signed)
Dr. Felicity Coyer notified of patient's blood sugar on one CBG machine is 488 and on another machine is 412, has an order for no HS correction insulin and receives only sliding scale during day with Lantus 20 units in AM.  Order received:  Give 2 units Novolog just tonight.

## 2013-06-14 NOTE — Progress Notes (Signed)
Occupational Therapy Note  Patient Details  Name: Adam Callahan MRN: [id number] Date of Birth: May 01, 1961 Today's Date: 06/14/2013  Time: 10:55-11:50am ( .) Pt seen for positioning, R attention during ADL tasks and hemi bathing and dressing techniques. Pt sitting in family room with visitor upon arrival, agreeable to wash up and dress. Once in room, pt set up at sink with cueing to correct posture. Pt required max verbal and tactile cueing during bathing for R attention and use of hemi techniques. Pt is able to cross LLE over to R for bathing and donning sock, although requires min A to hold foot in position. Max A to donn TED hose and R sock. Pt completed 1 sit <> stand during tx with min A to stand and mod A for correct posture and steadyness in standing. Pt completed oral hygiene with set up. Pt taken to nurses station at end of session, no pain reported.   Yezenia Fredrick M Lillard Bailon 06/14/2013, 12:01 PM

## 2013-06-14 NOTE — Progress Notes (Signed)
Speech Language Pathology Daily Session Note  Patient Details  Name: Adam Callahan MRN: [id number] Date of Birth: 03-20-1961  Today's Date: 06/14/2013 Time: 1415-1500 Time Calculation (min): 45 min  Short Term Goals: Week 1: SLP Short Term Goal 1 (Week 1): Pt will demonstrate effective mastication of Dys 2 textures with Min cues to assess readiness for advancement. SLP Short Term Goal 2 (Week 1): Pt will perform oral motor exercises with Max cues SLP Short Term Goal 3 (Week 1): Pt will use external aids to demonstrate orientation x4 with Max cues SLP Short Term Goal 4 (Week 1): Pt will name common objects with 80% accuracy with Max cues SLP Short Term Goal 5 (Week 1): Pt will identify at least 1 cognitive and 1 physical impairment with Max cues  Skilled Therapeutic Interventions: Skilled treatment focused on speech and cognitive-linguistic goals. SLP facilitated session with review of speech intelligibility strategies. Upon attempts at utilizing strategies at the word level today, question possible apraxia versus linguistic impairment with repetition, as pt was unable to repeat words despite Max multimodal cues. Pt imitated some accurate phonemes, however would add additional phonemes. At times, errors appeared to be perseverative, however due to decreased intelligibility this was difficult to adequately assess. Pt receptively identified common objects from a field of 2 with Supervision. He completed a cognitive task with Supervision level cues to sustain attention for ~10 minutes, attend to his right visual field, and receptively identify colors. Continue plan of care.   FIM:  Comprehension Comprehension Mode: Auditory Comprehension: 4-Understands basic 75 - 89% of the time/requires cueing 10 - 24% of the time Expression Expression Mode: Verbal Expression: 3-Expresses basic 50 - 74% of the time/requires cueing 25 - 50% of the time. Needs to repeat parts of sentences. Social  Interaction Social Interaction: 4-Interacts appropriately 75 - 89% of the time - Needs redirection for appropriate language or to initiate interaction. Problem Solving Problem Solving: 2-Solves basic 25 - 49% of the time - needs direction more than half the time to initiate, plan or complete simple activities Memory Memory: 2-Recognizes or recalls 25 - 49% of the time/requires cueing 51 - 75% of the time  Pain Pain Assessment Pain Assessment: No/denies pain  Therapy/Group: Individual Therapy  [name] 06/14/2013, 3:56 PM  [name], M.A. CCC-SLP [phone]

## 2013-06-14 NOTE — Progress Notes (Signed)
Physical Therapy Session Note  Patient Details  Name: Adam Callahan MRN: [id number] Date of Birth: 1960/12/26  Today's Date: 06/14/2013 Time: 0900-1000 Time Calculation (min): 60 min  Short Term Goals: Week 1:  PT Short Term Goal 1 (Week 1): Patient will perform supine to sit with min assist. PT Short Term Goal 2 (Week 1): Patient will perform dynamic sitting activities edge of mat with  supervision. PT Short Term Goal 3 (Week 1): Patient will propell wheelchair 150 feet in coltrolled environment with min verbal cueing to attend to right. PT Short Term Goal 4 (Week 1): Patient will transfer wheelchair <> bed with mod assist.  Skilled Therapeutic Interventions/Progress Updates:    Pt received seated in w/c at nurses station; agreeable to therapy session. Session focused on increasing pt independence with w/c mobility/management, sitting balance, and trunk control. Self-propulsion of standard w/c x150' using L hemi-technique, max A and multimodal cueing for obstacle negotiation secondary to R inattention. Verbal reinforcement to utilize green tape in hallway to steer w/c; moderate within-session carryover. Repositioning in chair with supervision/cueing due to decreased trunk control, tendency toward excessive anterior trunk lean. Squat pivot from w/c>mat table (to R side) with mod A. Seated EOM: static sitting balance with bilat UE support, min A, manual facilitation of RUE weightbearing to increase R shoulder girdle stability, improve trunk control. During sitting balance, pt conversed with visitors with min A at R ribs to prevent excessive L-sided trunk lean. During said conversation, pt became very tearful. Resting HR: 128. RN notified. Utilized therapeutic use of self to console and redirect pt. After HR decreased to 105 bpm, performed squat-pivot from mat>w/c (to L side) with max A, manual facilitation of anterior weight shift. Transported in w/c to nurses station, where pt was left with quick  release belt and all needs met; HR 101.  Therapy Documentation Precautions:  Precautions Precautions: Fall Precaution Comments: right inattention Restrictions Weight Bearing Restrictions: No Pain: Pain Assessment Pain Assessment: No/denies pain Pain Score: 0-No pain Locomotion : Wheelchair Mobility Distance: 150   See FIM for current functional status  Therapy/Group: Individual Therapy  Callahan, Adam Ishihara 06/14/2013, 10:17 AM

## 2013-06-14 NOTE — Progress Notes (Signed)
Patient refused cpap tonight, no distress noted.

## 2013-06-14 NOTE — Progress Notes (Signed)
Inpatient Diabetes Program Recommendations  AACE/ADA: New Consensus Statement on Inpatient Glycemic Control (2013)  Target Ranges:  Prepandial:   less than 140 mg/dL      Peak postprandial:   less than 180 mg/dL (1-2 hours)      Critically ill patients:  140 - 180 mg/dL     Results for SOLAN, VOSLER (MRN [id number]) as of 06/14/2013 12:01  Ref. Range 06/13/2013 07:44 06/13/2013 09:00 06/13/2013 12:19 06/13/2013 16:25 06/13/2013 17:05 06/13/2013 20:30 06/13/2013 20:38  Glucose-Capillary Latest Range: 70-99 mg/dL 409 (H) 811 (H) 914 (H) 268 (H) 312 (H) 488 (H) 412 (H)    Results for ACESON, LABELL (MRN [id number]) as of 06/14/2013 12:01  Ref. Range 06/14/2013 03:30 06/14/2013 07:21 06/14/2013 11:22  Glucose-Capillary Latest Range: 70-99 mg/dL 086 (H) 578 (H) 469 (H)    **Patient still having elevated fasting and postprandial glucose levels.  **Noted Lantus dose doubled today to 20 units daily in the AM   **MD- If patient continues to have elevated postprandial glucose levels, please consider adding Novolog meal coverage- Novolog 4 units tid with meals (PO intake 100% of meals)   Will follow. Ambrose Finland RN, MSN, CDE Diabetes Coordinator Inpatient Diabetes Program Team Pager: [phone] (8a-10p)

## 2013-06-15 ENCOUNTER — Inpatient Hospital Stay (HOSPITAL_COMMUNITY): Payer: Self-pay | Admitting: Rehabilitation

## 2013-06-15 ENCOUNTER — Inpatient Hospital Stay (HOSPITAL_COMMUNITY): Payer: BC Managed Care – PPO

## 2013-06-15 ENCOUNTER — Inpatient Hospital Stay (HOSPITAL_COMMUNITY): Payer: BC Managed Care – PPO | Admitting: Physical Therapy

## 2013-06-15 LAB — GLUCOSE, CAPILLARY
Glucose-Capillary: 206 mg/dL — ABNORMAL HIGH (ref 70–99)
Glucose-Capillary: 307 mg/dL — ABNORMAL HIGH (ref 70–99)
Glucose-Capillary: 202 mg/dL — ABNORMAL HIGH (ref 70–99)
Glucose-Capillary: 269 mg/dL — ABNORMAL HIGH (ref 70–99)

## 2013-06-15 MED ORDER — INSULIN GLARGINE 100 UNIT/ML ~~LOC~~ SOLN
25.0000 [IU] | Freq: Every day | SUBCUTANEOUS | Status: DC
  Administered 2013-06-15 – 2013-06-29 (×15): 25 [IU] via SUBCUTANEOUS
  Filled 2013-06-15 (×18): qty 0.25

## 2013-06-15 NOTE — Plan of Care (Signed)
Problem: RH KNOWLEDGE DEFICIT Goal: RH STG INCREASE KNOWLEDGE OF HYPERTENSION Patient and family will be able to verbalize understanding how to manage high blood pressure prior to discharge .  Outcome: Not Progressing Cognitively, unable to process or retain at this time

## 2013-06-15 NOTE — Progress Notes (Signed)
Occupational Therapy Session Note  Patient Details  Name: Adam Callahan MRN: [id number] Date of Birth: Jul 28, 1960  Today's Date: 06/15/2013 Time: 0730-0828 Time Calculation (min): 58 min  Short Term Goals: Week 1:  OT Short Term Goal 1 (Week 1): Pt will retrieve 2 grooming items on right side with cues  OT Short Term Goal 2 (Week 1): Pt will complete toilet transfer with mod assist OT Short Term Goal 3 (Week 1): Pt will complete LB dressing with max assist OT Short Term Goal 4 (Week 1): Pt will complete UB dressing with max assist   Skilled Therapeutic Interventions/Progress Updates:    Pt seen for ADL retraining with focus on attention to R, functional transfers, activity tolerance, and attention to task. Pt completed all transfers with min assist requiring cues for attention to RLE and RUE during transfers. Completed bathing in shower and pt washing L side of body and reporting completed all of bathing. Required verbal cues and assist for washing R side of body. Completed dressing from w/c at sink with cues for hemi dressing technique. Therapist presenting all items from R side with cues to turn head to locate item. Pt became tearful 3x during therapy session. Discussed progress pt has made and recovery from stroke. Pt agreed and reporting feeling better after discussion continues to be frustrated secondary to deficits. Pt left in room with RN present.   Therapy Documentation Precautions:  Precautions Precautions: Fall Precaution Comments: right inattention Restrictions Weight Bearing Restrictions: No General:   Vital Signs: Therapy Vitals Temp: 97.7 F (36.5 C) Temp src: Oral Pulse Rate: 84 Resp: 18 BP: 134/81 mmHg Patient Position, if appropriate: Lying Oxygen Therapy SpO2: 96 % O2 Device: None (Room air) Pain: No c/o pain during therapy session.   See FIM for current functional status  Therapy/Group: Individual Therapy  Daneil Dan 06/15/2013, 8:31  AM

## 2013-06-15 NOTE — Plan of Care (Signed)
Problem: RH PAIN MANAGEMENT Goal: RH STG PAIN MANAGED AT OR BELOW PT'S PAIN GOAL Less than 3  Outcome: Not Progressing C/o pain 6/10

## 2013-06-15 NOTE — Plan of Care (Signed)
Problem: RH SKIN INTEGRITY Goal: RH STG MAINTAIN SKIN INTEGRITY WITH ASSISTANCE STG Maintain Skin Integrity With min Assistance.  Outcome: Progressing Tegaderm to open knee wound at this time to promote healing

## 2013-06-15 NOTE — Progress Notes (Addendum)
Physical Therapy Session Note  Patient Details  Name: Adam Callahan MRN: [id number] Date of Birth: 1961/01/17  Today's Date: 06/15/2013 Time: 0905-1000 Time Calculation (min): 55 min  Short Term Goals: Week 1:  PT Short Term Goal 1 (Week 1): Patient will perform supine to sit with min assist. PT Short Term Goal 2 (Week 1): Patient will perform dynamic sitting activities edge of mat with  supervision. PT Short Term Goal 3 (Week 1): Patient will propell wheelchair 150 feet in coltrolled environment with min verbal cueing to attend to right. PT Short Term Goal 4 (Week 1): Patient will transfer wheelchair <> bed with mod assist.  Skilled Therapeutic Interventions/Progress Updates:    Pt received seated in w/c in room with pt friend present. Agreeable to session. Oriented x3; unable to correctly choose location when given 2 choices. Session focused on increasing pt independence with w/c mobility, functional transfers, and gait. Performed w/c mobility 1x150', 1x100' with supervision to min A for negotiation of door sills, small spaces; increased time required for pt self-correction of steering for effective obstacle negotiation. W/c mobility distance limited by pt fatigue. Squat-pivot from w/c<>mat table with min A; trunk control improved. Sit<>stand from mat table with mod A. Gait x10' using L hallway hand rail, max A for stability,+2A for w/c follow. Therapist (positioned under pt's R arm) provided manual facilitation of wt shifting, R knee stability during RLE swing phase, and advancement of RLE; verbal cues for upright posture, bilat hip extension. Pt able to actively initiate RLE advancement on last step. Gait stability/independence limited by pt difficulty initiating/completing adequate L step. Overall, pt required less assist for functional transfers/mobility as compared with previous session. Continues to exhibit decreased safety awareness. Session ended at nurses station, where pt was left  with quick release belt on for safety with all needs met.  Therapy Documentation Precautions:  Precautions Precautions: Fall Precaution Comments: right inattention Restrictions Weight Bearing Restrictions: No Pain: Pain Assessment Pain Assessment: No/denies pain Pain Score: 0-No pain Locomotion : Ambulation Ambulation/Gait Assistance:+2Total Wheelchair Mobility Distance: 150   See FIM for current functional status  Therapy/Group: Individual Therapy  Calvert Cantor 06/15/2013, 12:33 PM

## 2013-06-15 NOTE — Plan of Care (Signed)
Problem: RH SKIN INTEGRITY Goal: RH STG SKIN FREE OF INFECTION/BREAKDOWN Min A  Outcome: Not Progressing Scab to L knee wound came open during transfer

## 2013-06-15 NOTE — Plan of Care (Signed)
Problem: RH BOWEL ELIMINATION Goal: RH STG MANAGE BOWEL W/MEDICATION W/ASSISTANCE STG Manage Bowel with Medication with min Assistance.  Outcome: Not Progressing Required suppository during the noc

## 2013-06-15 NOTE — Progress Notes (Signed)
Physical Therapy Session Note  Patient Details  Name: MANVEER GOMES MRN: [id number] Date of Birth: 1961-04-07  Today's Date: 06/15/2013 Time: 1330-1400 Time Calculation (min): 30 min  Short Term Goals: Week 1:  PT Short Term Goal 1 (Week 1): Patient will perform supine to sit with min assist. PT Short Term Goal 2 (Week 1): Patient will perform dynamic sitting activities edge of mat with  supervision. PT Short Term Goal 3 (Week 1): Patient will propell wheelchair 150 feet in coltrolled environment with min verbal cueing to attend to right. PT Short Term Goal 4 (Week 1): Patient will transfer wheelchair <> bed with mod assist.  Skilled Therapeutic Interventions/Progress Updates:   Pt received sitting in w/c in room with ?niece present during session and was asking questions about a possible fall earlier in the day.  Went on with session, however stated that this PT would follow up with nursing following session to determine what may have happened.  Focused on gait training during session.  See details below.  Pt continues to require +2 assist for safety at this time and demonstrates severe R neglect, keeping trunk rotated to the left during first part of gait.  Upon returning to room, went to discuss possible fall with nursing.  RN states that she had not heard of a fall and went to speak with family member about what happened.  RN states that skin tear on knee was there previously and that during bathing session, scab was pulled away and re-dressed before PT session.  Family member in agreement and head of nursing to speak with family further regarding issue.  Pt left in room in w/c with quick release belt on and RN and nurse tech notified of need to change brief.    Therapy Documentation Precautions:  Precautions Precautions: Fall Precaution Comments: right inattention Restrictions Weight Bearing Restrictions: No Vital Signs: Therapy Vitals Temp: 98.2 F (36.8 C) Temp src:  Oral Pulse Rate: 111 Resp: 18 BP: 129/86 mmHg Patient Position, if appropriate: Lying Oxygen Therapy SpO2: 98 % O2 Device: None (Room air) Pain: Pain Assessment Pain Assessment: 0-10 Pain Score: 10-Worst pain ever Pain Type: Acute pain Pain Location: Neck Pain Orientation: Posterior Pain Descriptors / Indicators: Aching Pain Intervention(s): RN made aware Locomotion : Ambulation Ambulation: Yes Ambulation/Gait Assistance: 1: +2 Total assist Ambulation Distance (Feet): 8 Feet (then another 13') Assistive device:  (three muskateer style) Ambulation/Gait Assistance Details: Verbal cues for sequencing;Verbal cues for technique;Verbal cues for precautions/safety;Manual facilitation for weight shifting;Manual facilitation for placement;Manual facilitation for weight bearing Ambulation/Gait Assistance Details: Performed two reps of gait training this afternoon. First attempted gait in hallway with use of L handrail, however pt with very forward posture and L rotated trunk due to R neglect that he was unable to continue in this manner.  Also note that he would not put weight through RLE, therefore would "hop" with LLE during gait.  Transitioned to gym to utilize "three [employer] style" gait for inreased support with therapist on R assisting with L weight shift and advancing RLE and therapist on L providing assist for R weight shift and preventing LLE advancing too soon during gait.   Gait Gait: Yes Gait Pattern: Step-to pattern;Decreased step length - right;Decreased step length - left;Decreased stance time - right;Decreased weight shift to right;Decreased dorsiflexion - right;Trunk flexed;Narrow base of support   See FIM for current functional status  Therapy/Group: Individual Therapy  Vista Deck 06/15/2013, 4:53 PM

## 2013-06-15 NOTE — Progress Notes (Addendum)
Speech Language Pathology Daily Session Note  Patient Details  Name: Adam Callahan MRN: [id number] Date of Birth: [birth year]  Today's Date: 06/15/2013 Time: 7829-5621 Time Calculation (min): 45 min  Short Term Goals: Week 1: SLP Short Term Goal 1 (Week 1): Pt will demonstrate effective mastication of Dys 2 textures with Min cues to assess readiness for advancement. SLP Short Term Goal 2 (Week 1): Pt will perform oral motor exercises with Max cues SLP Short Term Goal 3 (Week 1): Pt will use external aids to demonstrate orientation x4 with Max cues SLP Short Term Goal 4 (Week 1): Pt will name common objects with 80% accuracy with Max cues SLP Short Term Goal 5 (Week 1): Pt will identify at least 1 cognitive and 1 physical impairment with Max cues  Skilled Therapeutic Interventions: Treatment focused on speech and cognitive-linguistic goals. SLP facilitated session with structured calendar task, which pt completed with Min cues for sustained attention and Total A for sequencing of numbers. Pt receptively identified objects and pictures from a field of 2 with Supervision level cueing, however required Mod cues for field of 3 and was unable to identify written words, including his name. Pt does not appear appropriate for communication board at this time, however was 90% accurate with basic yes/no questions. Accuracy decreased to 0% with more complex questions. Pt unable to repeat at the word-level. SLP recorded pt repeating "spoon" and played it back to patient, who was unable to demonstrate awareness of impaired language. Continue plan of care.   FIM:  Comprehension Comprehension Mode: Auditory Comprehension: 3-Understands basic 50 - 74% of the time/requires cueing 25 - 50%  of the time Expression Expression Mode: Verbal Expression: 3-Expresses basic 50 - 74% of the time/requires cueing 25 - 50% of the time. Needs to repeat parts of sentences. Social Interaction Social Interaction:  4-Interacts appropriately 75 - 89% of the time - Needs redirection for appropriate language or to initiate interaction. Problem Solving Problem Solving: 1-Solves basic less than 25% of the time - needs direction nearly all the time or does not effectively solve problems and may need a restraint for safety Memory Memory: 1-Recognizes or recalls less than 25% of the time/requires cueing greater than 75% of the time FIM - Eating Eating Activity: 4: Helper checks for pocketed food  Pain Pain Assessment Pain Assessment: 0-10 Pain Score: 10-Worst pain ever Pain Type: Acute pain Pain Location: Neck Pain Orientation: Posterior Pain Descriptors / Indicators: Aching Pain Intervention(s): RN made aware  Therapy/Group: Individual Therapy  [name] 06/15/2013, 5:11 PM  [name], M.A. CCC-SLP [phone]

## 2013-06-15 NOTE — Progress Notes (Signed)
52 y.o. right-handed male with history of diabetes mellitus with peripheral neuropathy. Patient was independent prior to admission driving . Admitted 06/06/2013 with acute right-sided weakness and aphasia after being found on the floor by his family. MRI of the brain acute infarct left PCA territory. There is acute infarct involving the left mid and posterior temporal lobe extending into the left occipital lobe as well acute infarct present left thalamus and left midbrain, left inferior cerebellum. MRA of the head negative for aneurysm. MRI of the neck with occlusions versus embolus versus dissection of the left posterior cerebral artery and left posterior inferior cerebellar artery compatible with acute infarct. Echocardiogram with ejection fraction of 55% no wall motion abnormalities and grade 1 diastolic dysfunction. Carotid Dopplers with no ICA stenosis. Patient did not receive TPA. Neurology services consulted presently on aspirin and Plavix for CVA prophylaxis as well as subcutaneous Lovenox for DVT prophylaxis. Presently maintained on a dysphagia 1 thin liquid diet. Hemoglobin A1c 10.9 Subjective/Complaints: Aphasic and dysarthric.  Sitting in chair in no distress  Poor awareness of deficits  Review of systems: Cannot obtain secondary to mental status  Objective: Vital Signs: Blood pressure 134/81, pulse 84, temperature 97.7 F (36.5 C), temperature source Oral, resp. rate 18, height 5\' 10"  (1.778 m), weight 82.6 kg (182 lb 1.6 oz), SpO2 96.00%. No results found. Results for orders placed during the hospital encounter of 06/09/13 (from the past 72 hour(s))  GLUCOSE, CAPILLARY     Status: Abnormal   Collection Time    06/12/13 12:48 PM      Result Value Range   Glucose-Capillary 369 (*) 70 - 99 mg/dL  GLUCOSE, CAPILLARY     Status: Abnormal   Collection Time    06/12/13  4:43 PM      Result Value Range   Glucose-Capillary 232 (*) 70 - 99 mg/dL  GLUCOSE, CAPILLARY     Status: Abnormal    Collection Time    06/12/13  8:33 PM      Result Value Range   Glucose-Capillary 231 (*) 70 - 99 mg/dL  GLUCOSE, CAPILLARY     Status: Abnormal   Collection Time    06/13/13  7:44 AM      Result Value Range   Glucose-Capillary 215 (*) 70 - 99 mg/dL  GLUCOSE, CAPILLARY     Status: Abnormal   Collection Time    06/13/13  9:00 AM      Result Value Range   Glucose-Capillary 281 (*) 70 - 99 mg/dL   Comment 1 Documented in Chart    GLUCOSE, CAPILLARY     Status: Abnormal   Collection Time    06/13/13 12:19 PM      Result Value Range   Glucose-Capillary 284 (*) 70 - 99 mg/dL  GLUCOSE, CAPILLARY     Status: Abnormal   Collection Time    06/13/13  4:25 PM      Result Value Range   Glucose-Capillary 268 (*) 70 - 99 mg/dL   Comment 1 Documented in Chart    GLUCOSE, CAPILLARY     Status: Abnormal   Collection Time    06/13/13  5:05 PM      Result Value Range   Glucose-Capillary 312 (*) 70 - 99 mg/dL  GLUCOSE, CAPILLARY     Status: Abnormal   Collection Time    06/13/13  8:30 PM      Result Value Range   Glucose-Capillary 488 (*) 70 - 99 mg/dL  GLUCOSE, CAPILLARY  Status: Abnormal   Collection Time    06/13/13  8:38 PM      Result Value Range   Glucose-Capillary 412 (*) 70 - 99 mg/dL  GLUCOSE, CAPILLARY     Status: Abnormal   Collection Time    06/14/13  3:30 AM      Result Value Range   Glucose-Capillary 240 (*) 70 - 99 mg/dL  GLUCOSE, CAPILLARY     Status: Abnormal   Collection Time    06/14/13  7:21 AM      Result Value Range   Glucose-Capillary 223 (*) 70 - 99 mg/dL  GLUCOSE, CAPILLARY     Status: Abnormal   Collection Time    06/14/13 11:22 AM      Result Value Range   Glucose-Capillary 362 (*) 70 - 99 mg/dL  GLUCOSE, CAPILLARY     Status: Abnormal   Collection Time    06/14/13  4:35 PM      Result Value Range   Glucose-Capillary 222 (*) 70 - 99 mg/dL  GLUCOSE, CAPILLARY     Status: Abnormal   Collection Time    06/14/13  8:41 PM      Result Value Range    Glucose-Capillary 226 (*) 70 - 99 mg/dL   Comment 1 Notify RN    GLUCOSE, CAPILLARY     Status: Abnormal   Collection Time    06/15/13  7:32 AM      Result Value Range   Glucose-Capillary 206 (*) 70 - 99 mg/dL   Comment 1 Notify RN       HEENT: normal Cardio: RRR and no murmur Resp: CTA B/L and unlabored GI: BS positive and NT, non distended Extremity:  Pulses positive and No Edema Skin:   Intact Neuro: Lethargic, Flat and Cranial Nerve Abnormalities decreased accomodation, R Delt bi tri grip trace, R HF,KE , ADF trace, Left side 4+/5,  Reduced sensation in RUE and RLE.  Speech with reduced output, strained quality, difficulty following commands Musc/Skel:  Normal Gen NAD   Assessment/Plan: 1. 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 Physiatrist is providing close team supervision and 24 hour management of active medical problems listed below. Physiatrist and rehab team continue to assess barriers to discharge/monitor patient progress toward functional and medical goals. FIM: FIM - Bathing Bathing Steps Patient Completed: Chest;Right Arm;Abdomen;Front perineal area;Right upper leg;Left upper leg Bathing: 3: Mod-Patient completes 5-7 88f 10 parts or 50-74%  FIM - Upper Body Dressing/Undressing Upper body dressing/undressing steps patient completed: Thread/unthread left sleeve of pullover shirt/dress;Put head through opening of pull over shirt/dress;Pull shirt over trunk Upper body dressing/undressing: 4: Min-Patient completed 75 plus % of tasks FIM - Lower Body Dressing/Undressing Lower body dressing/undressing steps patient completed: Thread/unthread left pants leg;Don/Doff left sock;Thread/unthread left underwear leg Lower body dressing/undressing: 3: Mod-Patient completed 50-74% of tasks  FIM -  Toileting Toileting: 0: Activity did not occur  FIM - Diplomatic Services operational officer Devices: Therapist, music Transfers: 0-Activity did not occur  FIM - Banker Devices: Arm rests Bed/Chair Transfer: 4: Supine > Sit: Min A (steadying Pt. > 75%/lift 1 leg);4: Bed > Chair or W/C: Min A (steadying Pt. > 75%)  FIM - Locomotion: Wheelchair Distance: 150 Locomotion: Wheelchair: 2: Travels 150 ft or more: maneuvers on rugs and over door sills with maximal assistance (Pt:  25 - 49%) FIM - Locomotion: Ambulation Locomotion: Ambulation Assistive Devices: Other (comment) (railing in hallway) Ambulation/Gait Assistance: Not tested (comment) Locomotion: Ambulation: 0: Activity did not occur  Comprehension Comprehension Mode: Auditory Comprehension: 4-Understands basic 75 - 89% of the time/requires cueing 10 - 24% of the time  Expression Expression Mode: Verbal Expression Assistive Devices: 6-Other (Comment) (dysarthic and aphaisic) Expression: 3-Expresses basic 50 - 74% of the time/requires cueing 25 - 50% of the time. Needs to repeat parts of sentences.  Social Interaction Social Interaction: 4-Interacts appropriately 75 - 89% of the time - Needs redirection for appropriate language or to initiate interaction.  Problem Solving Problem Solving: 2-Solves basic 25 - 49% of the time - needs direction more than half the time to initiate, plan or complete simple activities  Memory Memory: 2-Recognizes or recalls 25 - 49% of the time/requires cueing 51 - 75% of the time Medical Problem List and Plan:  1. Embolic left PCA infarct, right hemiparesis, right hemisensory deficits, right homonymous hemianopsia  2. DVT Prophylaxis/Anticoagulation: Subcutaneous Lovenox. Monitor platelet counts and any signs of bleeding  3. Pain Management: Tylenol as needed. Monitor with increased mobility  4. Neuropsych: This patient is not capable of making  decisions on his own behalf.  5. Dysphagia/aphasia. Dysphagia 1 and liquids. Monitor for any signs of aspiration. Followup speech therapy.  6. Diabetes mellitus with peripheral neuropathy, uncontrolled. Hemoglobin A1c 10.9. Lantus insulin titrate as per am CBG  Check CBGs a.c. and at bedtime,SSI. Provide diabetic teaching.  7. Hyperlipidemia. Lipitor    LOS (Days) 6 A FACE TO FACE EVALUATION WAS PERFORMED  Jara Feider E 06/15/2013, 9:46 AM

## 2013-06-15 NOTE — Plan of Care (Signed)
Problem: RH SAFETY Goal: RH STG ADHERE TO SAFETY PRECAUTIONS W/ASSISTANCE/DEVICE STG Adhere to Safety Precautions With min Assistance/Device.  Outcome: Not Progressing Impulsive at times

## 2013-06-15 NOTE — Plan of Care (Signed)
Problem: RH BLADDER ELIMINATION Goal: RH STG MANAGE BLADDER WITH ASSISTANCE STG Manage Bladder With mod I  Outcome: Progressing Continent x 1 today

## 2013-06-15 NOTE — Plan of Care (Signed)
Problem: RH KNOWLEDGE DEFICIT Goal: RH STG INCREASE KNOWLEDGE OF DIABETES Patient and family will be able to demonstrate and verbalize understanding of management of diabetes prior to discharge  Outcome: Not Progressing Cognitively, pt is unable to process information at this time.

## 2013-06-15 NOTE — Plan of Care (Signed)
Problem: RH BOWEL ELIMINATION Goal: RH STG MANAGE BOWEL WITH ASSISTANCE STG Manage Bowel with mod I  Outcome: Progressing LBM 06/11/13- suppository given 06/14/13 with success

## 2013-06-16 ENCOUNTER — Inpatient Hospital Stay (HOSPITAL_COMMUNITY): Payer: BC Managed Care – PPO

## 2013-06-16 ENCOUNTER — Inpatient Hospital Stay (HOSPITAL_COMMUNITY): Payer: BC Managed Care – PPO | Admitting: Physical Therapy

## 2013-06-16 ENCOUNTER — Encounter (HOSPITAL_COMMUNITY): Payer: Self-pay

## 2013-06-16 ENCOUNTER — Encounter (HOSPITAL_COMMUNITY): Payer: Self-pay | Admitting: Radiology

## 2013-06-16 LAB — CREATININE, SERUM: GFR calc non Af Amer: >90 mL/min (ref >90)

## 2013-06-16 LAB — GLUCOSE, CAPILLARY
Glucose-Capillary: 209 mg/dL — ABNORMAL HIGH (ref 70–99)
Glucose-Capillary: 265 mg/dL — ABNORMAL HIGH (ref 70–99)
Glucose-Capillary: 259 mg/dL — ABNORMAL HIGH (ref 70–99)

## 2013-06-16 NOTE — Progress Notes (Signed)
Physical Therapy Note  Patient Details  Name: Adam Callahan MRN: [id number] Date of Birth: December 28, 1960 Today's Date: 06/16/2013  4098-1191 (40 minutes) individual Pain: no reported pain Focus of treatment: Neuro re-ed Rt LE; gait training focusing on Rt knee control in stance Treatment: Pt up in wc upon arrival; wife present; transfers max assist setup / min/mod assist for level scooting transfer; sit to supine mod assist; Neuro re-ed RT LE- AA hip flexion in gravity eliminated (sidelying ); pt able to actively flex hip approximately 3/4 range; supine AA hip abduction; bridging ; AA hip ER/IR in hooklying; gait along rail left + ace wrap rt ankle mod /max assist for balance / blocking Rt knee in extension; pt impulsive and attempts to advance trunk prematurely ( 15 feet X 3); returned to room with quick release belt in place.   Boby Eyer,JIM 06/16/2013, 11:23 AM

## 2013-06-16 NOTE — Progress Notes (Signed)
Occupational Therapy Session Note  Patient Details  Name: Adam Callahan MRN: [id number] Date of Birth: [birth year]  Today's Date: 06/16/2013 Time: 4098-1191 Time Calculation (min): 57 min  Short Term Goals: Week 1:  OT Short Term Goal 1 (Week 1): Pt will retrieve 2 grooming items on right side with cues  OT Short Term Goal 2 (Week 1): Pt will complete toilet transfer with mod assist OT Short Term Goal 3 (Week 1): Pt will complete LB dressing with max assist OT Short Term Goal 4 (Week 1): Pt will complete UB dressing with max assist   Skilled Therapeutic Interventions/Progress Updates:    Pt engaged in ADL retraining including bathing at shower level and dressing with sit<>stand from w/c at sink.  Pt required max verbal cues to bathe right side of body.  Pt required max verbal cues for sequencing and thoroughness throughout bathing tasks.  Pt required max verbal cues and demonstrational cues for hemi dressing techniques.  Pt labile X 3 during session. Emotional support provided. Focus on activity tolerance, task initiation, sequencing, attention to right, hemi bathing/dressing techniques, sit<>stand, standing balance, and safety awareness.  Therapy Documentation Precautions:  Precautions Precautions: Fall Precaution Comments: right inattention Restrictions Weight Bearing Restrictions: No   Pain: Pain Assessment Pain Assessment: No/denies pain  See FIM for current functional status  Therapy/Group: Individual Therapy  Rich Brave 06/16/2013, 10:45 AM

## 2013-06-16 NOTE — Progress Notes (Signed)
NUTRITION FOLLOW UP  Intervention:    1. Continue Glucerna Shake po daily, each supplement provides 220 kcal and 10 grams of protein.  2. Continue MVI  Nutrition Dx:   Increased nutrient needs related to participation in therapies as evidenced by estimated needs; ongoing.   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 Admitted 11/16 with acute infarct of left PCA territory. Currently maintained on a Dysphagia 1 diet with thin liquids.  Difficult to understand pt, family at bedside and reports good intake of meals. Pt likes the Glucerna Shake.  CBG's remain elevated, insulin being adjusted. Plan for DM diet education closer to discharge.   Height: Ht Readings from Last 1 Encounters:  06/09/13 5\' 10"  (1.778 m)    Weight Status:   Wt Readings from Last 1 Encounters:  06/11/13 182 lb 1.6 oz (82.6 kg)  Admission weight: 181 lb (82.5 kg)  Re-estimated needs:  Kcal: 1750 - 2000  Protein: 80 - 95 g  Fluid: 1.7 - 2 liters daily  Skin: knee abrasion  Diet Order: Dysphagia 1 with Thin Liquids Meal Completion: 100%   Intake/Output Summary (Last 24 hours) at 06/16/13 1135 Last data filed at 06/15/13 1556  Gross per 24 hour  Intake    240 ml  Output      0 ml  Net    240 ml    Last BM: 11/24   Labs:   Recent Labs Lab 06/09/13 1740 06/10/13 0530 06/16/13 0525  NA  --  136  --   K  --  3.9  --   CL  --  99  --   CO2  --  26  --   BUN  --  7  --   CREATININE 0.63 0.55 0.71  CALCIUM  --  8.9  --   GLUCOSE  --  214*  --     CBG (last 3)   Recent Labs  06/15/13 1622 06/15/13 2051 06/16/13 0723  GLUCAP 202* 269* 209*   Lab Results  Component Value Date   HGBA1C 10.9* 06/07/2013   Scheduled Meds: . atorvastatin  20 mg Oral q1800  . clopidogrel  75 mg Oral Q breakfast  . enoxaparin  (LOVENOX) injection  40 mg Subcutaneous Daily  . feeding supplement (GLUCERNA SHAKE)  237 mL Oral Daily  . insulin aspart  0-15 Units Subcutaneous TID WC  . insulin glargine  25 Units Subcutaneous Daily  . multivitamin with minerals  1 tablet Oral Daily    Continuous Infusions:   Kendell Bane RD, LDN, CNSC [phone] Pager [phone] After Hours Pager

## 2013-06-16 NOTE — Progress Notes (Signed)
Physical Therapy Session Note  Patient Details  Name: Adam Callahan MRN: [id number] Date of Birth: May 20, 1961  Today's Date: 06/16/2013 Time: 1027-2536 Time Calculation (min): 54 min  Short Term Goals: Week 1:  PT Short Term Goal 1 (Week 1): Patient will perform supine to sit with min assist. PT Short Term Goal 2 (Week 1): Patient will perform dynamic sitting activities edge of mat with  supervision. PT Short Term Goal 3 (Week 1): Patient will propell wheelchair 150 feet in coltrolled environment with min verbal cueing to attend to right. PT Short Term Goal 4 (Week 1): Patient will transfer wheelchair <> bed with mod assist.  Skilled Therapeutic Interventions/Progress Updates:    Pt received seated in w/c in family room accompanied by niece, who remained present throughout session. Pt agreeable to session. Session focused on sitting/standing balance, functional transfers. W/c mobility x100' using L hemi-technique, supervision. W/c mobility trial ended secondary to pt guarding L aspect of abdomen. RN notified; donned L w/c leg rest and instructed pt to rest. No c/o abdominal pain for remainder of session. Squat-pivot from w/c<>mat table with min/modA; multimodal cues for safety awareness. Seated EOM: bilat scooting with min guard secondary to poor trunk control (tendency toward excessive anterior weight shift/trunk lean). Performed multiple sit<>stand transfers from [facility] with PT demonstration of proper setup, manual facilitation of proper posture. See below for balance interventions. Therapist transported pt in w/c to room, where pt was left seated in w/c with quick release belt on, niece present, and all needs met. HR maintained at <115 bpm throughout session.  Static sitting without UE support: trunk lean to L side; able to self-correct with therapist mirroring pt posture. Dynamic sitting with bilat UE support, bilat weight shifting; manual stabilization of R shoulder girdle with gentle  approximation to promote RUE weightbearing, facilitate postural control. Dynamic sitting with RUE weightbearing, LUE reaching in all directions; pt with decreased trunk control with reaching across midline. Multiple static standing trials performed with mirror anterior to pt for visual feedback to address pt perception of midline. Manual cueing/facilitation focused on addressing narrow BOS and pt tendency to maintain RLE nonweightbearing/R knee flexion in static standing; inconsistent within-session carryover.  Therapy Documentation Precautions:  Precautions Precautions: Fall Precaution Comments: right inattention Restrictions Weight Bearing Restrictions: No Vital Signs: Therapy Vitals Temp src: Oral Pulse Rate: 113 Resp: 19 BP: 154/88 mmHg Patient Position, if appropriate: Lying Oxygen Therapy SpO2: 98 % O2 Device: None (Room air) Pain: Pain Assessment Pain Assessment: 0-10 Pain Score: 4  Faces Pain Scale: No hurt Pain Type: Acute pain Pain Location: Abdomen Pain Orientation: Left Pain Descriptors / Indicators: Grimacing;Guarding Pain Onset: Gradual Patients Stated Pain Goal: 0 Pain Intervention(s): RN made aware;Rest;Repositioned Multiple Pain Sites: No Locomotion : Wheelchair Mobility Distance: 100   See FIM for current functional status  Therapy/Group: Individual Therapy  Calvert Cantor 06/16/2013, 5:58 PM

## 2013-06-16 NOTE — Progress Notes (Signed)
Pt refusing CPAP tonight. No distress. RT will monitor

## 2013-06-16 NOTE — Progress Notes (Signed)
Social Work [name], male   DOB: [birth year], 52 y.o.   MRN: [id number] Met with pt and goddaugther to inform of team conference goals-min level of assist and discharge 12/9.  Addressed concerns regarding pt's fall and not feeling his needs are being addressed by nursing Staff.  Had [employer] talk with both regarding their concerns.  Will also contact pt's aunt to inform since she is arranging his discharge needs.  Pt becomes tearful and emotional when discussing His health and deficits.  He is trying his best but feels he is not progressing as quickly as he would like.  God daughter here daily and attends therapy with him.

## 2013-06-16 NOTE — Patient Care Conference (Signed)
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 Dehydration 06/07/2013  . Hypokalemia 06/07/2013  . Dyslipidemia 06/07/2013  . CVA (cerebral infarction) 06/06/2013  . Diabetes mellitus type 2, uncontrolled 06/06/2013  . Rhabdomyolysis 06/06/2013    Expected Discharge Date: Expected Discharge Date: 06/29/13  Team Members Present: Physician leading conference: Dr. Claudette Laws Social Worker Present: Dossie Der, LCSW;Jenny Prevatt, LCSW Nurse Present: Chana Bode, RN PT Present: Other (comment) Anson Fret) OT Present: Bretta Bang, OT SLP Present: [name], SLP PPS Coordinator present : Tora Duck, RN, CRRN     Current Status/Progress Goal Weekly Team Focus  Medical   Patient reports fall however no staff has corroborated this period complains of right knee as well as pain back of her head. CT scan head and knee x-ray normal  Maintain safety upgrade functional status to allow for home discharge  Continue rehabilitation program, initiate family teaching   Bowel/Bladder   Intermittent continent and incontinent of bowel and bladder. Continent x 2 of bladder. on 06/15/13.  Incontinent of bowel x 1 on 11/24.  LBM 06/14/13 after suppository  Managed bowel and bladder   Timed toileting q 2-3 hrs    Swallow/Nutrition/ Hydration   Dys 1 textures and thin liquids  without straws, full supervision  least restrictive PO intake  diet tolerance, use of strategies, trials of advanced solids   ADL's   mod assist bathing and LB dressing, min assist functional transfers, min assist UB dressing, max-total assist toilet task   min-supervision  R NMR, functional transfers, hemi dressing, attention to R, activity tolerance, strengthening, pt/family education    Mobility   transfers with min/mod A, ambulation with +2 totalA  transfers with supervision, ambulation with modA  transfer training, gait training, stair training   Communication   unable to repeat at word level with Max cues, receptively identify out of field of 3 with Mod cues  Min cues  repetition, receptive identification   Safety/Cognition/ Behavioral Observations  decreased intellectual awareness, orientation, sustained attention  Min  intellectual awareness, orientation, sustained attention, following two-step commands   Pain   C/o of headache, Tylenol 650mg  q 4 hrs  <3  Offer pain medicaiton 1 hour prior to initial therapy   Skin   Open wound to R lateral knee. Tegaderm place to site  No additonal skin breakdown  Monitor for appropriate healing      *See Care Plan and progress notes for long and short-term goals.  Barriers to Discharge: Family with poor understanding of patient's deficits.    Possible Resolutions to Barriers:  Improve family/caregiver education    Discharge Planning/Teaching Needs:  Aunt arranging she and god daughter to be there 24 hour-unsure how realistic they are regarding pt's level of care      Team Discussion:  Timed toileting fo B & B program.  R-Filed cut and  receptive deficits.  CT head unchanged and also of knee.  Making good progress-god daugther here to observe in therapies.  Revisions to Treatment Plan:  None   Continued Need for Acute Rehabilitation Level of Care: The patient requires daily medical management by a physician with specialized training in  physical medicine and rehabilitation for the following conditions: Daily direction of a multidisciplinary physical rehabilitation program to ensure safe treatment while eliciting the highest outcome that is of practical value to the patient.: Yes Daily medical management of patient stability for increased activity during participation in an intensive rehabilitation regime.: Yes Daily analysis of laboratory values and/or radiology reports with any subsequent need for medication adjustment of medical intervention for : Neurological problems  Lucine Bilski, Lemar Livings 06/18/2013, 4:13 PM

## 2013-06-16 NOTE — Progress Notes (Signed)
Speech Language Pathology Daily Session Note  Patient Details  Name: Adam Callahan MRN: [id number] Date of Birth: 09/15/1960  Today's Date: 06/16/2013 Time: 1330-1400 Time Calculation (min): 30 min  Short Term Goals: Week 1: SLP Short Term Goal 1 (Week 1): Pt will demonstrate effective mastication of Dys 2 textures with Min cues to assess readiness for advancement. SLP Short Term Goal 2 (Week 1): Pt will perform oral motor exercises with Max cues SLP Short Term Goal 3 (Week 1): Pt will use external aids to demonstrate orientation x4 with Max cues SLP Short Term Goal 4 (Week 1): Pt will name common objects with 80% accuracy with Max cues SLP Short Term Goal 5 (Week 1): Pt will identify at least 1 cognitive and 1 physical impairment with Max cues  Skilled Therapeutic Interventions: Treatment focused on cognitive-linguistic goals. SLP facilitated session with Max faded to Supervision cues for receptive identification of common objects from a field of three. Pt unable to repeat names of objects at the word level despite Max cues. Pt counted 1-30 without additional phonemes with Max faded to Min cues, however with a very fast rate. Pt said the alphabet with ~80% accuracy with Max faded to Min cues as well. Pt attempted to sing Happy Birthday, however perseverated on counting. When asked what other familiar songs he liked, pt spontaneously began to sing a verse of "[employer]ani Hawking is Coming to [place]." Continue plan of care.   FIM:  Comprehension Comprehension Mode: Auditory Comprehension: 3-Understands basic 50 - 74% of the time/requires cueing 25 - 50%  of the time Expression Expression Mode: Verbal Expression: 3-Expresses basic 50 - 74% of the time/requires cueing 25 - 50% of the time. Needs to repeat parts of sentences. Social Interaction Social Interaction: 4-Interacts appropriately 75 - 89% of the time - Needs redirection for appropriate language or to initiate interaction. Problem  Solving Problem Solving: 1-Solves basic less than 25% of the time - needs direction nearly all the time or does not effectively solve problems and may need a restraint for safety Memory Memory: 1-Recognizes or recalls less than 25% of the time/requires cueing greater than 75% of the time FIM - Eating Eating Activity: 4: Helper checks for pocketed food;4: Helper occasionally scoops food on utensil;4: Help with picking up utensils  Pain Pain Assessment Pain Assessment: Faces Faces Pain Scale: No hurt  Therapy/Group: Individual Therapy  [name] 06/16/2013, 4:30 PM  [name], M.A. CCC-SLP [phone]

## 2013-06-17 LAB — GLUCOSE, CAPILLARY
Glucose-Capillary: 134 mg/dL — ABNORMAL HIGH (ref 70–99)
Glucose-Capillary: 322 mg/dL — ABNORMAL HIGH (ref 70–99)
Glucose-Capillary: 261 mg/dL — ABNORMAL HIGH (ref 70–99)
Glucose-Capillary: 286 mg/dL — ABNORMAL HIGH (ref 70–99)

## 2013-06-17 MED ORDER — TRAMADOL HCL 50 MG PO TABS
50.0000 mg | ORAL_TABLET | Freq: Four times a day (QID) | ORAL | Status: DC | PRN
  Administered 2013-06-17 – 2013-06-27 (×15): 50 mg via ORAL
  Filled 2013-06-17 (×15): qty 1

## 2013-06-17 MED ORDER — SERTRALINE HCL 50 MG PO TABS
50.0000 mg | ORAL_TABLET | Freq: Every day | ORAL | Status: DC
  Administered 2013-06-17 – 2013-06-29 (×13): 50 mg via ORAL
  Filled 2013-06-17 (×15): qty 1

## 2013-06-17 MED ORDER — TRAMADOL HCL 50 MG PO TABS: 50.0000 mg | ORAL_TABLET | Freq: Four times a day (QID) | ORAL | Status: DC

## 2013-06-17 NOTE — Progress Notes (Signed)
Patient refusing to wear CPAP.  Was told if changed his mind to call RT.

## 2013-06-17 NOTE — Progress Notes (Signed)
Pt c/o headache, specifically to L posterior head. Pt tearful with expressive aphasia. Family in room. Tylenol 650mg  given previously at 1727. Pt reports ineffectiveness. Marissa Nestle, PA notified. Order given for Tramadol 50mg  q 6hr prn. On-coming nurse to administer and monitor for effectiveness.

## 2013-06-17 NOTE — Progress Notes (Signed)
Pt refusing CPAP tonight. No distress 

## 2013-06-17 NOTE — Plan of Care (Signed)
Problem: RH PAIN MANAGEMENT Goal: RH STG PAIN MANAGED AT OR BELOW PT'S PAIN GOAL Less than 3  Outcome: Progressing No c/o      

## 2013-06-17 NOTE — Progress Notes (Signed)
52 y.o. right-handed male with history of diabetes mellitus with peripheral neuropathy. Patient was independent prior to admission driving . Admitted 06/06/2013 with acute right-sided weakness and aphasia after being found on the floor by his family. MRI of the brain acute infarct left PCA territory. There is acute infarct involving the left mid and posterior temporal lobe extending into the left occipital lobe as well acute infarct present left thalamus and left midbrain, left inferior cerebellum. MRA of the head negative for aneurysm. MRI of the neck with occlusions versus embolus versus dissection of the left posterior cerebral artery and left posterior inferior cerebellar artery compatible with acute infarct. Echocardiogram with ejection fraction of 55% no wall motion abnormalities and grade 1 diastolic dysfunction. Carotid Dopplers with no ICA stenosis. Patient did not receive TPA. Neurology services consulted presently on aspirin and Plavix for CVA prophylaxis as well as subcutaneous Lovenox for DVT prophylaxis. Presently maintained on a dysphagia 1 thin liquid diet. Hemoglobin A1c 10.9 Subjective/Complaints: Aphasic and dysarthric.  Emotional lability, becomes tearful for no apparent reason Poor awareness of deficits  Review of systems: Cannot obtain secondary to mental status  Objective: Vital Signs: Blood pressure 128/82, pulse 90, temperature 98.3 F (36.8 C), temperature source Oral, resp. rate 18, height 5\' 10"  (1.778 m), weight 76.204 kg (168 lb), SpO2 97.00%. Dg Knee 1-2 Views Right  06/16/2013   CLINICAL DATA:  Right knee pain following injury, initial encounter  EXAM: RIGHT KNEE - 1-2 VIEW  COMPARISON:  None.  FINDINGS: There is no evidence of fracture, dislocation, or joint effusion. There is no evidence of arthropathy or other focal bone abnormality. Soft tissues are unremarkable.  IMPRESSION: No acute abnormality noted.   Electronically Signed   By: Alcide Clever M.D.   On: 06/16/2013  10:27   Ct Head Wo Contrast  06/16/2013   CLINICAL DATA:  Stroke.  Fall.  Headache.  EXAM: CT HEAD WITHOUT CONTRAST  TECHNIQUE: Contiguous axial images were obtained from the base of the skull through the vertex without intravenous contrast.  COMPARISON:  MRI 06/07/2013.  CT of 06/06/2013  FINDINGS: Evolving subacute left PCA infarct again noted. This involves the left thalamus, left temporal and occipital lobe and splenium of the corpus callosum. This is unchanged in distribution from the prior study. Left cerebellar subacute infarct also unchanged.  Chronic infarct left frontal white matter unchanged.  Negative for acute hemorrhage. No new area of infarction compared with the prior study. Ventricle size is normal and there is no midline shift  IMPRESSION: Subacute left PCA infarct is unchanged. Subacute left cerebellar infarct is unchanged.  No acute hemorrhage or new of infarction.   Electronically Signed   By: Marlan Palau M.D.   On: 06/16/2013 09:47   Results for orders placed during the hospital encounter of 06/09/13 (from the past 72 hour(s))  GLUCOSE, CAPILLARY     Status: Abnormal   Collection Time    06/14/13 11:22 AM      Result Value Range   Glucose-Capillary 362 (*) 70 - 99 mg/dL  GLUCOSE, CAPILLARY     Status: Abnormal   Collection Time    06/14/13  4:35 PM      Result Value Range   Glucose-Capillary 222 (*) 70 - 99 mg/dL  GLUCOSE, CAPILLARY     Status: Abnormal   Collection Time    06/14/13  8:41 PM      Result Value Range   Glucose-Capillary 226 (*) 70 - 99 mg/dL   Comment 1  Notify RN    GLUCOSE, CAPILLARY     Status: Abnormal   Collection Time    06/15/13  7:32 AM      Result Value Range   Glucose-Capillary 206 (*) 70 - 99 mg/dL   Comment 1 Notify RN    GLUCOSE, CAPILLARY     Status: Abnormal   Collection Time    06/15/13 11:30 AM      Result Value Range   Glucose-Capillary 307 (*) 70 - 99 mg/dL   Comment 1 Notify RN    GLUCOSE, CAPILLARY     Status: Abnormal    Collection Time    06/15/13  4:22 PM      Result Value Range   Glucose-Capillary 202 (*) 70 - 99 mg/dL   Comment 1 Notify RN    GLUCOSE, CAPILLARY     Status: Abnormal   Collection Time    06/15/13  8:51 PM      Result Value Range   Glucose-Capillary 269 (*) 70 - 99 mg/dL   Comment 1 Notify RN    CREATININE, SERUM     Status: None   Collection Time    06/16/13  5:25 AM      Result Value Range   Creatinine, Ser 0.71  0.50 - 1.35 mg/dL   GFR calc non Af Amer >90  >90 mL/min   GFR calc Af Amer >90  >90 mL/min   Comment: (NOTE)     The eGFR has been calculated using the CKD EPI equation.     This calculation has not been validated in all clinical situations.     eGFR's persistently <90 mL/min signify possible Chronic Kidney     Disease.  GLUCOSE, CAPILLARY     Status: Abnormal   Collection Time    06/16/13  7:23 AM      Result Value Range   Glucose-Capillary 209 (*) 70 - 99 mg/dL  GLUCOSE, CAPILLARY     Status: Abnormal   Collection Time    06/16/13 11:33 AM      Result Value Range   Glucose-Capillary 265 (*) 70 - 99 mg/dL   Comment 1 Notify RN    GLUCOSE, CAPILLARY     Status: Abnormal   Collection Time    06/16/13  4:32 PM      Result Value Range   Glucose-Capillary 271 (*) 70 - 99 mg/dL  GLUCOSE, CAPILLARY     Status: Abnormal   Collection Time    06/16/13  8:56 PM      Result Value Range   Glucose-Capillary 259 (*) 70 - 99 mg/dL   Comment 1 Notify RN    GLUCOSE, CAPILLARY     Status: Abnormal   Collection Time    06/17/13  7:36 AM      Result Value Range   Glucose-Capillary 134 (*) 70 - 99 mg/dL     HEENT: normal Cardio: RRR and no murmur Resp: CTA B/L and unlabored GI: BS positive and NT, non distended Extremity:  Pulses positive and No Edema Skin:   Intact Neuro: Lethargic, Flat and Cranial Nerve Abnormalities decreased accomodation, R Delt bi tri grip trace, R HF,KE , ADF trace, Left side 4+/5,  Reduced sensation in RUE and RLE.  Speech with reduced  output, strained quality, difficulty following commands Musc/Skel:  Normal Gen NAD Mood and affect are labile  Assessment/Plan: 1. 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 Physiatrist is providing close team supervision and 24 hour management of active medical problems listed below. Physiatrist and rehab team continue to assess barriers to discharge/monitor patient progress toward functional and medical goals. FIM: FIM - Bathing Bathing Steps Patient Completed: Chest;Right Arm;Abdomen;Front perineal area;Right upper leg;Left upper leg Bathing: 3: Mod-Patient completes 5-7 64f 10 parts or 50-74%  FIM - Upper Body Dressing/Undressing Upper body dressing/undressing steps patient completed: Thread/unthread left sleeve of pullover shirt/dress;Put head through opening of pull over shirt/dress;Pull shirt over trunk Upper body dressing/undressing: 4: Min-Patient completed 75 plus % of tasks FIM - Lower Body Dressing/Undressing Lower body dressing/undressing steps patient completed: Thread/unthread left pants leg;Don/Doff left sock Lower body dressing/undressing: 2: Max-Patient completed 25-49% of tasks  FIM - Toileting Toileting: 0: Activity did not occur  FIM - Diplomatic Services operational officer Devices: Therapist, music Transfers: 0-Activity did not occur  FIM - Banker Devices: Arm rests Bed/Chair Transfer: 3: Chair or W/C > Bed: Mod A (lift or lower assist);3: Bed > Chair or W/C: Mod A (lift or lower assist)  FIM - Locomotion: Wheelchair Distance: 100 Locomotion: Wheelchair: 2: Travels 50 - 149 ft with supervision, cueing or coaxing FIM - Locomotion: Ambulation Locomotion: Ambulation Assistive Devices:  (three Risk manager) Ambulation/Gait Assistance:  1: +2 Total assist Locomotion: Ambulation: 1: Two helpers  Comprehension Comprehension Mode: Auditory Comprehension: 3-Understands basic 50 - 74% of the time/requires cueing 25 - 50%  of the time  Expression Expression Mode: Verbal Expression Assistive Devices: 6-Other (Comment) (dysarthic and aphaisic) Expression: 3-Expresses basic 50 - 74% of the time/requires cueing 25 - 50% of the time. Needs to repeat parts of sentences.  Social Interaction Social Interaction: 4-Interacts appropriately 75 - 89% of the time - Needs redirection for appropriate language or to initiate interaction.  Problem Solving Problem Solving: 1-Solves basic less than 25% of the time - needs direction nearly all the time or does not effectively solve problems and may need a restraint for safety  Memory Memory: 1-Recognizes or recalls less than 25% of the time/requires cueing greater than 75% of the time Medical Problem List and Plan:  1. Embolic left PCA infarct, right hemiparesis, right hemisensory deficits, right homonymous hemianopsia  2. DVT Prophylaxis/Anticoagulation: Subcutaneous Lovenox. Monitor platelet counts and any signs of bleeding  3. Pain Management: Tylenol as needed. Monitor with increased mobility  4. Neuropsych: This patient is not capable of making decisions on his own behalf.  5. Dysphagia/aphasia. Dysphagia 1 and liquids. Monitor for any signs of aspiration. Followup speech therapy.  6. Diabetes mellitus with peripheral neuropathy, uncontrolled. Hemoglobin A1c 10.9. Lantus insulin titrate as per am CBG  Check CBGs a.c. and at bedtime,SSI. Provide diabetic teaching.  7. Hyperlipidemia. Lipitor  8. Emotional lability after stroke start sertraline  LOS (Days) 8 A FACE TO FACE EVALUATION WAS PERFORMED  Keimani Laufer E 06/17/2013, 9:26 AM

## 2013-06-18 ENCOUNTER — Inpatient Hospital Stay (HOSPITAL_COMMUNITY): Payer: BC Managed Care – PPO | Admitting: Physical Therapy

## 2013-06-18 ENCOUNTER — Inpatient Hospital Stay (HOSPITAL_COMMUNITY): Payer: BC Managed Care – PPO

## 2013-06-18 ENCOUNTER — Encounter (HOSPITAL_COMMUNITY): Payer: Self-pay

## 2013-06-18 LAB — GLUCOSE, CAPILLARY
Glucose-Capillary: 256 mg/dL — ABNORMAL HIGH (ref 70–99)
Glucose-Capillary: 165 mg/dL — ABNORMAL HIGH (ref 70–99)

## 2013-06-18 MED ORDER — INSULIN ASPART 100 UNIT/ML ~~LOC~~ SOLN
4.0000 [IU] | Freq: Three times a day (TID) | SUBCUTANEOUS | Status: DC
  Administered 2013-06-18 – 2013-06-19 (×4): 4 [IU] via SUBCUTANEOUS

## 2013-06-18 NOTE — Progress Notes (Signed)
Patient continues to refuse the CPAP machine and is aware to call RT if he changes his mind. RT will continue to assist as needed.

## 2013-06-18 NOTE — Progress Notes (Signed)
Physical Therapy Weekly Progress Note  Patient Details  Name: Adam Callahan MRN: [id number] Date of Birth: Jun 15, 1961  Today's Date: 06/18/2013 Time: 1100-1200 and 1345-1410 Time Calculation (min): 60 min and 25 min  Patient has met 4 of 4 short term goals.  Since PT evaluation, pt has demonstrated improved stability/independence with bed mobility, functional transfers, dynamic sitting balance, and w/c mobility. Short-term goals added to address bed mobility, transfers, dynamic standing balance, and ambulation.  Patient continues to demonstrate the following deficits: R hemiparesis, R-sided inattention, decreased functional endurance, and therefore will continue to benefit from skilled PT intervention to enhance overall performance with activity tolerance, balance, postural control, ability to compensate for deficits, functional use of  right upper extremity and right lower extremity, attention and awareness.  Patient progressing toward long term goals..  Continue plan of care.  PT Short Term Goals Week 1:  PT Short Term Goal 1 (Week 1): Patient will perform supine to sit with min assist. PT Short Term Goal 1 - Progress (Week 1): Met PT Short Term Goal 2 (Week 1): Patient will perform dynamic sitting activities edge of mat with  supervision. PT Short Term Goal 2 - Progress (Week 1): Met PT Short Term Goal 3 (Week 1): Patient will propell wheelchair 150 feet in coltrolled environment with min verbal cueing to attend to right. PT Short Term Goal 3 - Progress (Week 1): Met PT Short Term Goal 4 (Week 1): Patient will transfer wheelchair <> bed with mod assist. PT Short Term Goal 4 - Progress (Week 1): Met Week 2:  PT Short Term Goal 1 (Week 2): Pt will perform dynamic standing activities with consistent mod A. PT Short Term Goal 2 (Week 2): Patient will perform supine<>sit with supervision. PT Short Term Goal 3 (Week 2): Patient will perform w/c<>mat transfer with consistent min A. PT  Short Term Goal 4 (Week 2): Patient will ambulate 59' with total A of single therapist.  Skilled Therapeutic Interventions/Progress Updates:    Treatment Session 1 Pt received seated in w/c in room accompanied by family member (pt's God-mother's niece), who remained present throughout session. Session focused on functional transfers, RLE NMR, and gait. Pt performed self-propulsion of w/c via L hemi-technique x150' with supervision, min cueing for obstacle negotiation on R side. Once in gym, pt performed squat-pivot from w/c>mat (to R side) with min A. Min verbal cueing for safe use of w/c brakes. Hand over hand assist for management of w/c arm/leg rests with effective within-session carryover. See below for detailed description of RLE NMR Squat-pivot from mat table>w/c with mod A. Improved postural control during w/c mobility and transfers, as compared with previous sessions.  Sit<>stand from w/c to parallel bars with LUE support with min guard. In standing, performed bilat weight shifting (focus on RLE weight acceptance) with LUE support x4 min total with manual stabilization of R knee extension.  Gait x67' with +2 total A (3 musketeers style) with brief seated rest break after 20' to adjust setup. Therapist positioned under pt's R arm provided assist for correct RLE foot placement (as pt was able to actively advance RLE), bilat weight shifting, and provided min guard-min A for R knee stabilization during RLE stance phase. Therapist positioned under pt's L arm manually facilitated anterior weight shift and provided visual aid (therapist's foot) in front of pt's L foot to remind pt to wait until RLE swing phase was complete and R knee was stabilized prior to initiating LLE advancement. Verbal cueing to facilitate upright  posture, for timing of LLE advancement. Pt transported to day room in w/c secondary to pt fatigue. Therapist departed with pt seated in day room with quick release belt on, family member and  CNA present, and all needs met.  Treatment Session 2: Pt received seated in w/c; agreeable to therapy. Session focused on increasing pt safety/independence with dynamic sitting/standing balance. Squat-pivot from w/c>bed with mod A. While seated EOM, therapist demonstrated head-hips relationship with bilat scooting; pt gave effective return demonstration. See below for detailed description of balance interventions. Sit>supine with min A for RLE management, bed rail, HOB flat. Therapist departed with pt semi-reclined in bed with bed alarm on, 3 rails up, abovementioned family member present, and all needs within reach.  Therapy Documentation Precautions:  Precautions Precautions: Fall Precaution Comments: right inattention Restrictions Weight Bearing Restrictions: No Pain: Pain Assessment Pain Assessment: No/denies pain Pain Score: 0-No pain Locomotion : Ambulation Ambulation/Gait Assistance: 1: +2 Total assist (3 musketeers assist) Wheelchair Mobility Distance: 150  Balance:  PM session: Seated EOM: dynamic sitting balance without UE support with supervision; pt playing multiple games of tic-tac-toe which require pt to write (with LUE) on notepad positioned at all angles (focus on anterior weight shifting and cross-body reaching). Pt exhibits effective weight shifting in all planes with no LOB; close supervision required during anterior weight shifting.  Dynamic standing balance: pt performed medial/lateral weight shifting without UE support with min guard to mod A for single posterolateral (R side) LOB.    Other treatments: AM session: Supine: RLE PNF D2 flexion/extension x5 reps rhythmic initiation, x8 reps, x6 reps resisted. Pt able to initiate and complete full R hip/knee flexion/extension but requires manual assistance to facilitate R hip adduction with concurrent hip flexion as well as R ankle dorsiflexion. Trace R ankle dorsiflexion noted during final set.  See FIM for current  functional status  Therapy/Group: Individual Therapy  Hobble, Lorenda Ishihara 06/18/2013, 6:10 PM

## 2013-06-18 NOTE — Progress Notes (Signed)
Speech Language Pathology Weekly Progress & Daily Session Notes  Patient Details  Name: Adam Callahan MRN: [id number] Date of Birth: Jul 25, 1960  Today's Date: 06/18/2013 Time: 1100-1200 Time Calculation (min): 60 min  Short Term Goals: Week 1: SLP Short Term Goal 1 (Week 1): Pt will demonstrate effective mastication of Dys 2 textures with Min cues to assess readiness for advancement. SLP Short Term Goal 1 - Progress (Week 1): Not met SLP Short Term Goal 2 (Week 1): Pt will perform oral motor exercises with Max cues SLP Short Term Goal 2 - Progress (Week 1): Not met SLP Short Term Goal 3 (Week 1): Pt will use external aids to demonstrate orientation x4 with Max cues SLP Short Term Goal 3 - Progress (Week 1): Not met SLP Short Term Goal 4 (Week 1): Pt will name common objects with 80% accuracy with Max cues SLP Short Term Goal 4 - Progress (Week 1): Not met SLP Short Term Goal 5 (Week 1): Pt will identify at least 1 cognitive and 1 physical impairment with Max cues SLP Short Term Goal 5 - Progress (Week 1): Not met  New Short Term Goals: Week 2: SLP Short Term Goal 1 (Week 2): Pt will demonstrate effective mastication of Dys 2 textures with Min cues to assess readiness for advancement. SLP Short Term Goal 2 (Week 2): Pt will perform oral motor exercises with Max cues SLP Short Term Goal 3 (Week 2): Pt will use external aids to demonstrate orientation x4 with Max cues SLP Short Term Goal 4 (Week 2): Pt will name common objects with 80% accuracy with Max cues SLP Short Term Goal 5 (Week 2): Pt will identify at least 1 cognitive and 1 physical impairment with Max cues  Weekly Progress Updates: Pt has met 0 out of 5 STGs during this reporting period, however has demonstrated pre-functional gains in speech therapy. He continues to consume Dys 1 textures and thin liquids without straws with Mod cues for utilization of safe swallowing strategies. Will plan to initiate trials of Dys 2 textures  within this upcoming reporting period. He is using his speech intelligibility strategies with Max faded to Mod cues to increase intelligibility, and has been working on naming objects with Max-Total A. Pt is perseverative and more expressively than receptively aphasic, however can complete automatic speech tasks such as counting with Min cues. Pt has been able to identify one cognitive impairment due to his stroke, although continues to present with reduced awareness of deficits. Pt will benefit from continued SLP services to maximize swallowing safety, communication, and cognitive functioning prior to discharge.   SLP Intensity: Minumum of 1-2 x/day, 30 to 90 minutes SLP Frequency: 5 out of 7 days SLP Duration/Estimated Length of Stay: 18-20 days SLP Treatment/Interventions: Cognitive remediation/compensation;Cueing hierarchy;Dysphagia/aspiration precaution training;Environmental controls;Functional tasks;Internal/external aids;Oral motor exercises;Patient/family education;Speech/Language facilitation;Therapeutic Activities;Therapeutic Exercise  Daily Session Skilled Therapeutic Intervention: Treatment focused on swallowing and cognitive-linguistic goals. SLP facilitated session with skilled observation of breakfast meal consisting of Dys 1 textures and thin liquids with no overt s/s of aspiration observed. Pt will right anterior spillage, mild right-sided pocketing, and mild oral residuals. Pt utilized safe swallowing strategies with Mod cues. Today, pt clearly and spontaneously communicated with supervision using gestures as well as words and short phrases, including "little on top" and "work first." During confrontational naming tasks, pt required a model to say "eggs", "salt", and "pepper". Pt left with a notepad and pencil drawing pictures at the end of the session; he was able  to draw familiar objects when asked.  FIM:  Comprehension Comprehension Mode: Auditory Comprehension: 3-Understands  basic 50 - 74% of the time/requires cueing 25 - 50%  of the time Expression Expression Mode: Verbal Expression: 3-Expresses basic 50 - 74% of the time/requires cueing 25 - 50% of the time. Needs to repeat parts of sentences. Social Interaction Social Interaction: 4-Interacts appropriately 75 - 89% of the time - Needs redirection for appropriate language or to initiate interaction. Problem Solving Problem Solving: 2-Solves basic 25 - 49% of the time - needs direction more than half the time to initiate, plan or complete simple activities Memory Memory: 1-Recognizes or recalls less than 25% of the time/requires cueing greater than 75% of the time FIM - Eating Eating Activity: 5: Needs verbal cues/supervision;5: Set-up assist for open containers;4: Helper checks for pocketed food General    Pain Pain Assessment Pain Assessment: No/denies pain Pain Score: 0-No pain  Therapy/Group: Individual Therapy  [name] 06/18/2013, 12:30 PM  [name], M.A. CCC-SLP [phone]

## 2013-06-18 NOTE — Progress Notes (Signed)
Occupational Therapy Weekly Progress Note  Patient Details  Name: Adam Callahan MRN: [id number] Date of Birth: 1961-05-01  Today's Date: 06/18/2013 Time: 0930-1030 Time Calculation (min): 60 min  Patient has met 4 of 4 short term goals.  Patient has been progressing well during his rehab stay. He demonstrates mild improvements of awareness of RUE/RLE during functional transfers requiring cues to attend and assist for positioning. Patient becomes emotional at times during therapy sessions requiring education and encouragement on stroke prognosis. Patient has progressed to min assist with functional transfers going to both his left and right. Patient requires cues for hemi dressing technique then he is able to assist with all steps. Patient continues to have left gaze and reports of double vision. Patient is in the process of receiving an eye patch to assist with double vision. Patient's family has been present during a couple of therapy sessions however have been a Psychologist, clinical and not engaged in sessions.   Patient continues to demonstrate the following deficits: right hemiplegia, decreased activity tolerance, decreased awareness, decreased functional use of RUE/RLE, aphasia, decreased coordination, decreased postural control in standing, decreased strength, and decreased safety awareness and therefore will continue to benefit from skilled OT intervention to enhance overall performance with BADL, functional use of RUE/RLE, activity tolerance.   Patient progressing toward long term goals..  Continue plan of care.  OT Short Term Goals Week 1:  OT Short Term Goal 1 (Week 1): Pt will retrieve 2 grooming items on right side with cues  OT Short Term Goal 1 - Progress (Week 1): Met OT Short Term Goal 2 (Week 1): Pt will complete toilet transfer with mod assist OT Short Term Goal 2 - Progress (Week 1): Met OT Short Term Goal 3 (Week 1): Pt will complete LB dressing with max assist OT Short Term Goal 3  - Progress (Week 1): Met OT Short Term Goal 4 (Week 1): Pt will complete UB dressing with max assist  Week 2:  OT Short Term Goal 1 (Week 2): Pt will complete LB dressing with mod assist consistently  OT Short Term Goal 2 (Week 2): Pt will complete bathing with min assist and no more then 1 verbal cue OT Short Term Goal 3 (Week 2): Pt will complete UB dressing with min assist and no cues for hemi dressing technique OT Short Term Goal 4 (Week 2): Pt will compete functional transfers with min verbal cues for awareness of RLE/RUE  Skilled Therapeutic Interventions/Progress Updates:    Pt seen for ADL retraining with focus on attention to R, functional transfers, use of RUE at a gross assist level, and activity tolerance. Pt received supine in bed. Completed all transfers with min assist and verbal cues for technique and safety. Pt required verbal and tactile to was RUE and RLE. Presented all items from pt's R side during dressing and emphasized verbalization of clothing requiring therapist to say word then pt able to repeat. Pt required cues and assist for hemi dressing technique. Completed sit<>stand with min assist and mod assist for postural control. Therapist assisted with crossover technique with RLE. Pt required total assist for positioning of RUE and RLE during self-care tasks. At end of session pt left sitting in w/c with all needs in reach and family member present. Discussed using call light with pt and had him demonstrate what he needed to do when asking for assistance. Pt required assist to locate call light on first attempt when placed in midline as pushed tv button instead.  Therapy Documentation Precautions:  Precautions Precautions: Fall Precaution Comments: right inattention Restrictions Weight Bearing Restrictions: No General:   Vital Signs:   Pain: Pain Assessment Pain Assessment: No/denies pain Pain Score: 0-No pain  See FIM for current functional status  Therapy/Group:  Individual Therapy  Daneil Dan 06/18/2013, 12:34 PM

## 2013-06-18 NOTE — Plan of Care (Signed)
Problem: RH PAIN MANAGEMENT Goal: RH STG PAIN MANAGED AT OR BELOW PT'S PAIN GOAL Less than 3  Outcome: Not Progressing C/o pain 9/10

## 2013-06-19 ENCOUNTER — Inpatient Hospital Stay (HOSPITAL_COMMUNITY): Payer: BC Managed Care – PPO | Admitting: Speech Pathology

## 2013-06-19 ENCOUNTER — Inpatient Hospital Stay (HOSPITAL_COMMUNITY): Payer: BC Managed Care – PPO | Admitting: Physical Therapy

## 2013-06-19 ENCOUNTER — Inpatient Hospital Stay (HOSPITAL_COMMUNITY): Payer: BC Managed Care – PPO | Admitting: *Deleted

## 2013-06-19 LAB — GLUCOSE, CAPILLARY
Glucose-Capillary: 210 mg/dL — ABNORMAL HIGH (ref 70–99)
Glucose-Capillary: 187 mg/dL — ABNORMAL HIGH (ref 70–99)
Glucose-Capillary: 152 mg/dL — ABNORMAL HIGH (ref 70–99)
Glucose-Capillary: 228 mg/dL — ABNORMAL HIGH (ref 70–99)

## 2013-06-19 NOTE — Progress Notes (Signed)
Physical Therapy Note  Patient Details  Name: Adam Callahan MRN: [id number] Date of Birth: 04-25-1961 Today's Date: 06/19/2013  1191-4782 (55 minutes) individual Pain: no reported pain Focus of treatment : Gait training with LiteGait focusing on increased weight bearing / knee extension control in stance Treatment: Pt up in wc upon arrival; attempted gait with EVA walker +2 assist; pt able to advance Rt LE with scissoring gait pattern but unable to accept and attain RT knee extension in stance; gait with LiteGait for safety +2 assist with therapist preventing scissoring and assist to attain Rt knee extension in stance; pt continues with minimum , impulsive step on left 2 X 20 feet ; standing in LiteGait with left foot on 4 inch step and max assist to maintain Rt knee extension. ; returned to nurses station with quick release belt in place.    1300-1325 (25 minutes) individual Pain: 3/10 headache/ nurse aware/ pain meds given Focus of treatment: Therapeutic activities focused on increased weight bearing RT LE/ RT knee extension control in stance Treatment: Sit to stand to mat min assist ; attempted standing to mat (blocking RT knee in extension) and placing left knee on mat with chair for support; pt unable to place left knee on mat without right knee buckling; standing as above with increase weight shifts to right X 2 min assist; returned to room with quick release belt in place.   [name],JIM 06/19/2013, 9:46 AM

## 2013-06-19 NOTE — Progress Notes (Signed)
52 y.o. right-handed male with history of diabetes mellitus with peripheral neuropathy. Patient was independent prior to admission driving . Admitted 06/06/2013 with acute right-sided weakness and aphasia after being found on the floor by his family. MRI of the brain acute infarct left PCA territory. There is acute infarct involving the left mid and posterior temporal lobe extending into the left occipital lobe as well acute infarct present left thalamus and left midbrain, left inferior cerebellum. MRA of the head negative for aneurysm. MRI of the neck with occlusions versus embolus versus dissection of the left posterior cerebral artery and left posterior inferior cerebellar artery compatible with acute infarct. Echocardiogram with ejection fraction of 55% no wall motion abnormalities and grade 1 diastolic dysfunction. Carotid Dopplers with no ICA stenosis. Patient did not receive TPA. Neurology services consulted presently on aspirin and Plavix for CVA prophylaxis as well as subcutaneous Lovenox for DVT prophylaxis. Presently maintained on a dysphagia 1 thin liquid diet. Hemoglobin A1c 10.9 Subjective/Complaints: Aphasic and dysarthric.  Emotional lability, becomes tearful for no apparent reason Poor awareness of deficits  Review of systems: Cannot obtain secondary to mental status  Objective: Vital Signs: Blood pressure 136/86, pulse 83, temperature 98.4 F (36.9 C), temperature source Oral, resp. rate 18, height 5\' 10"  (1.778 m), weight 76.204 kg (168 lb), SpO2 97.00%. No results found. Results for orders placed during the hospital encounter of 06/09/13 (from the past 72 hour(s))  GLUCOSE, CAPILLARY     Status: Abnormal   Collection Time    06/16/13 11:33 AM      Result Value Range   Glucose-Capillary 265 (*) 70 - 99 mg/dL   Comment 1 Notify RN    GLUCOSE, CAPILLARY     Status: Abnormal   Collection Time    06/16/13  4:32 PM      Result Value Range   Glucose-Capillary 271 (*) 70 - 99  mg/dL  GLUCOSE, CAPILLARY     Status: Abnormal   Collection Time    06/16/13  8:56 PM      Result Value Range   Glucose-Capillary 259 (*) 70 - 99 mg/dL   Comment 1 Notify RN    GLUCOSE, CAPILLARY     Status: Abnormal   Collection Time    06/17/13  7:36 AM      Result Value Range   Glucose-Capillary 134 (*) 70 - 99 mg/dL  GLUCOSE, CAPILLARY     Status: Abnormal   Collection Time    06/17/13 11:53 AM      Result Value Range   Glucose-Capillary 322 (*) 70 - 99 mg/dL  GLUCOSE, CAPILLARY     Status: Abnormal   Collection Time    06/17/13  5:30 PM      Result Value Range   Glucose-Capillary 261 (*) 70 - 99 mg/dL  GLUCOSE, CAPILLARY     Status: Abnormal   Collection Time    06/17/13  8:21 PM      Result Value Range   Glucose-Capillary 286 (*) 70 - 99 mg/dL  GLUCOSE, CAPILLARY     Status: Abnormal   Collection Time    06/18/13  7:40 AM      Result Value Range   Glucose-Capillary 141 (*) 70 - 99 mg/dL  GLUCOSE, CAPILLARY     Status: Abnormal   Collection Time    06/18/13 12:03 PM      Result Value Range   Glucose-Capillary 256 (*) 70 - 99 mg/dL  GLUCOSE, CAPILLARY     Status: Abnormal  Collection Time    06/18/13  4:58 PM      Result Value Range   Glucose-Capillary 165 (*) 70 - 99 mg/dL  GLUCOSE, CAPILLARY     Status: Abnormal   Collection Time    06/18/13  8:36 PM      Result Value Range   Glucose-Capillary 183 (*) 70 - 99 mg/dL  GLUCOSE, CAPILLARY     Status: Abnormal   Collection Time    06/19/13  7:44 AM      Result Value Range   Glucose-Capillary 161 (*) 70 - 99 mg/dL     HEENT: normal Cardio: RRR and no murmur Resp: CTA B/L and unlabored GI: BS positive and NT, non distended Extremity:  Pulses positive and No Edema Skin:   Intact Neuro: Lethargic, Flat and Cranial Nerve Abnormalities decreased accomodation, R Delt bi tri grip trace, 2 minus R HF, 0/5 KE , ADF trace, Left side 4+/5,  Reduced sensation in RUE and RLE.  Speech with reduced output, strained  quality, difficulty following commands Musc/Skel:  Normal Gen NAD Mood and affect are labile  Assessment/Plan: 1. 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 Physiatrist is providing close team supervision and 24 hour management of active medical problems listed below. Physiatrist and rehab team continue to assess barriers to discharge/monitor patient progress toward functional and medical goals. FIM: FIM - Bathing Bathing Steps Patient Completed: Chest;Right Arm;Abdomen;Front perineal area;Right upper leg;Left upper leg Bathing: 3: Mod-Patient completes 5-7 10f 10 parts or 50-74%  FIM - Upper Body Dressing/Undressing Upper body dressing/undressing steps patient completed: Thread/unthread left sleeve of pullover shirt/dress;Pull shirt over trunk Upper body dressing/undressing: 3: Mod-Patient completed 50-74% of tasks FIM - Lower Body Dressing/Undressing Lower body dressing/undressing steps patient completed: Thread/unthread left pants leg;Don/Doff left sock;Thread/unthread left underwear leg Lower body dressing/undressing: 3: Mod-Patient completed 50-74% of tasks  FIM - Toileting Toileting steps completed by patient: Adjust clothing prior to toileting;Performs perineal hygiene;Adjust clothing after toileting Toileting: 1: Total-Patient completed zero steps, helper did all 3  FIM - Diplomatic Services operational officer Devices: Grab bars Toilet Transfers: 0-Activity did not occur  FIM - Banker Devices: Bed rails;Arm rests Bed/Chair Transfer: 4: Sit > Supine: Min A (steadying pt. > 75%/lift 1 leg);3: Chair or W/C > Bed: Mod A (lift or lower assist)  FIM - Locomotion: Wheelchair Distance: 150 Locomotion: Wheelchair: 5: Travels 150 ft or more: maneuvers on rugs  and over door sills with supervision, cueing or coaxing FIM - Locomotion: Ambulation Locomotion: Ambulation Assistive Devices:  (three Risk manager) Ambulation/Gait Assistance: 1: +2 Total assist (3 musketeers assist) Locomotion: Ambulation: 1: Two helpers  Comprehension Comprehension Mode: Auditory Comprehension: 3-Understands basic 50 - 74% of the time/requires cueing 25 - 50%  of the time  Expression Expression Mode: Verbal Expression Assistive Devices: 6-Other (Comment) (dysarthic and aphaisic) Expression: 3-Expresses basic 50 - 74% of the time/requires cueing 25 - 50% of the time. Needs to repeat parts of sentences.  Social Interaction Social Interaction: 4-Interacts appropriately 75 - 89% of the time - Needs redirection for appropriate language or to initiate interaction.  Problem Solving Problem Solving: 2-Solves basic 25 - 49% of the time - needs direction more than half the time to initiate, plan or complete simple activities  Memory Memory: 1-Recognizes or recalls less than 25% of the time/requires cueing  greater than 75% of the time Medical Problem List and Plan:  1. Embolic left PCA infarct, right hemiparesis, right hemisensory deficits, right homonymous hemianopsia  2. DVT Prophylaxis/Anticoagulation: Subcutaneous Lovenox. Monitor platelet counts and any signs of bleeding  3. Pain Management: Tylenol as needed. Monitor with increased mobility  4. Neuropsych: This patient is not capable of making decisions on his own behalf.  5. Dysphagia/aphasia. Dysphagia 1 and liquids. Monitor for any signs of aspiration. Followup speech therapy.  6. Diabetes mellitus with peripheral neuropathy, uncontrolled. Hemoglobin A1c 10.9. Lantus insulin titrate as per am CBG  Check CBGs a.c. and at bedtime,SSI. Provide diabetic teaching.  7. Hyperlipidemia. Lipitor  8. Emotional lability after stroke improving on sertraline  LOS (Days) 10 A FACE TO FACE EVALUATION WAS  PERFORMED  KIRSTEINS,ANDREW E 06/19/2013, 10:13 AM

## 2013-06-19 NOTE — Progress Notes (Signed)
Occupational Therapy Note Patient Details  Name: Adam Callahan MRN: [id number] Date of Birth: [birth year] Today's Date: 06/19/2013 Time: 1625-1725  (60 min) Pain:none Individual session  Engaged in bed mobility, transfers, sit to stand, dynamic sitting balance, static standing balance, attending to right, crossing midline.  Pt. Lying in bed upon OT arrival.  Pt. Eagerly got to EOB with mod assist and sat with supervision.  Transferred to left with mod assist.  Had pt assist with bathing feet in basin and leaning over with minimal assist for balance.  He moved rapidly and was cued to slow down and move with control.  Pt agreed and followed better these precautions.  Stood at sink for 3-4 minutes during pericare with RUE placed in weight bearing position.  Pt stood x4 during session with min assist for static standing balance and tactile cues to extend right knee.  Donned hospital gowns and pt slide gown over right arm with verbal cues.  At end of session, pt agreed to eat supper at nursing station.  Took pt and handed him off to nurse tech.     Humberto Seals 06/19/2013, 5:29 PM

## 2013-06-19 NOTE — Progress Notes (Signed)
Speech Language Pathology Daily Session Note  Patient Details  Name: Adam Callahan MRN: [id number] Date of Birth: February 06, 1961  Today's Date: 06/19/2013 Time: 0865-7846 Time Calculation (min): 45 min  Short Term Goals: Week 2: SLP Short Term Goal 1 (Week 2): Pt will demonstrate effective mastication of Dys 2 textures with Min cues to assess readiness for advancement. SLP Short Term Goal 2 (Week 2): Pt will perform oral motor exercises with Max cues SLP Short Term Goal 3 (Week 2): Pt will use external aids to demonstrate orientation x4 with Max cues SLP Short Term Goal 4 (Week 2): Pt will name common objects with 80% accuracy with Max cues SLP Short Term Goal 5 (Week 2): Pt will identify at least 1 cognitive and 1 physical impairment with Max cues  Skilled Therapeutic Interventions: Skilled therapeutic intervention complete, targeting dysphagia and expressive language.  Patient participated in oral motor exercises with moderate verbal and tactile cues.  He consumed trial of DII consistencies and had no s/s of aspiration.   He required maximum verbal and tactile cues with confrontation naming exercise.  He demonstrated increased accuracy with rote activities such as counting and reciting days of week.  The patient perseverated on days of week and was unable to be redirected with visual cues to state months of the year.  He was able to produce a few spontaneous  phrases  such as, "Back to bed,  another drink, and thank you."  Continue with current treatment plan    FIM:  Expression Expression Mode: Verbal Expression: 3-Expresses basic 50 - 74% of the time/requires cueing 25 - 50% of the time. Needs to repeat parts of sentences. FIM - Eating Eating Activity: 5: Set-up assist for apply device (including dentures);5: Needs verbal cues/supervision;4: Helper checks for pocketed food  Pain Pain Assessment Pain Assessment: No/denies pain  Therapy/Group: Individual Therapy  Lenny Pastel 06/19/2013, 12:52 PM

## 2013-06-20 LAB — GLUCOSE, CAPILLARY
Glucose-Capillary: 144 mg/dL — ABNORMAL HIGH (ref 70–99)
Glucose-Capillary: 134 mg/dL — ABNORMAL HIGH (ref 70–99)
Glucose-Capillary: 114 mg/dL — ABNORMAL HIGH (ref 70–99)

## 2013-06-20 MED ORDER — HYDROCODONE-ACETAMINOPHEN 5-325 MG PO TABS
1.0000 | ORAL_TABLET | Freq: Four times a day (QID) | ORAL | Status: DC | PRN
  Administered 2013-06-20 – 2013-06-25 (×8): 1 via ORAL
  Filled 2013-06-20 (×9): qty 1

## 2013-06-20 MED ORDER — INSULIN ASPART 100 UNIT/ML ~~LOC~~ SOLN
6.0000 [IU] | Freq: Three times a day (TID) | SUBCUTANEOUS | Status: DC
  Administered 2013-06-20 – 2013-06-29 (×27): 6 [IU] via SUBCUTANEOUS

## 2013-06-20 NOTE — Progress Notes (Signed)
52 y.o. right-handed male with history of diabetes mellitus with peripheral neuropathy. Patient was independent prior to admission driving . Admitted 06/06/2013 with acute right-sided weakness and aphasia after being found on the floor by his family. MRI of the brain acute infarct left PCA territory. There is acute infarct involving the left mid and posterior temporal lobe extending into the left occipital lobe as well acute infarct present left thalamus and left midbrain, left inferior cerebellum. MRA of the head negative for aneurysm. MRI of the neck with occlusions versus embolus versus dissection of the left posterior cerebral artery and left posterior inferior cerebellar artery compatible with acute infarct. Echocardiogram with ejection fraction of 55% no wall motion abnormalities and grade 1 diastolic dysfunction. Carotid Dopplers with no ICA stenosis. Patient did not receive TPA. Neurology services consulted presently on aspirin and Plavix for CVA prophylaxis as well as subcutaneous Lovenox for DVT prophylaxis. Presently maintained on a dysphagia 1 thin liquid diet. Hemoglobin A1c 10.9 Subjective/Complaints: Aphasic and dysarthric.  Emotional lability improved Poor awareness of deficits  Review of systems: Cannot obtain secondary to mental status  Objective: Vital Signs: Blood pressure 130/80, pulse 91, temperature 98.1 F (36.7 C), temperature source Oral, resp. rate 17, height 5\' 10"  (1.778 m), weight 76.204 kg (168 lb), SpO2 96.00%. No results found. Results for orders placed during the hospital encounter of 06/09/13 (from the past 72 hour(s))  GLUCOSE, CAPILLARY     Status: Abnormal   Collection Time    06/17/13  7:36 AM      Result Value Range   Glucose-Capillary 134 (*) 70 - 99 mg/dL  GLUCOSE, CAPILLARY     Status: Abnormal   Collection Time    06/17/13 11:53 AM      Result Value Range   Glucose-Capillary 322 (*) 70 - 99 mg/dL  GLUCOSE, CAPILLARY     Status: Abnormal   Collection Time    06/17/13  5:30 PM      Result Value Range   Glucose-Capillary 261 (*) 70 - 99 mg/dL  GLUCOSE, CAPILLARY     Status: Abnormal   Collection Time    06/17/13  8:21 PM      Result Value Range   Glucose-Capillary 286 (*) 70 - 99 mg/dL  GLUCOSE, CAPILLARY     Status: Abnormal   Collection Time    06/18/13  7:40 AM      Result Value Range   Glucose-Capillary 141 (*) 70 - 99 mg/dL  GLUCOSE, CAPILLARY     Status: Abnormal   Collection Time    06/18/13 12:03 PM      Result Value Range   Glucose-Capillary 256 (*) 70 - 99 mg/dL  GLUCOSE, CAPILLARY     Status: Abnormal   Collection Time    06/18/13  4:58 PM      Result Value Range   Glucose-Capillary 165 (*) 70 - 99 mg/dL  GLUCOSE, CAPILLARY     Status: Abnormal   Collection Time    06/18/13  8:36 PM      Result Value Range   Glucose-Capillary 183 (*) 70 - 99 mg/dL  GLUCOSE, CAPILLARY     Status: Abnormal   Collection Time    06/19/13  7:44 AM      Result Value Range   Glucose-Capillary 161 (*) 70 - 99 mg/dL  GLUCOSE, CAPILLARY     Status: Abnormal   Collection Time    06/19/13 11:30 AM      Result Value Range   Glucose-Capillary  216 (*) 70 - 99 mg/dL  GLUCOSE, CAPILLARY     Status: Abnormal   Collection Time    06/19/13  1:05 PM      Result Value Range   Glucose-Capillary 210 (*) 70 - 99 mg/dL   Comment 1 Documented in Chart    GLUCOSE, CAPILLARY     Status: Abnormal   Collection Time    06/19/13  3:52 PM      Result Value Range   Glucose-Capillary 187 (*) 70 - 99 mg/dL  GLUCOSE, CAPILLARY     Status: Abnormal   Collection Time    06/19/13  5:25 PM      Result Value Range   Glucose-Capillary 152 (*) 70 - 99 mg/dL  GLUCOSE, CAPILLARY     Status: Abnormal   Collection Time    06/19/13  9:08 PM      Result Value Range   Glucose-Capillary 228 (*) 70 - 99 mg/dL     HEENT: normal Cardio: RRR and no murmur Resp: CTA B/L and unlabored GI: BS positive and NT, non distended Extremity:  Pulses positive  and No Edema Skin:   Intact Neuro: Lethargic, Flat and Cranial Nerve Abnormalities decreased accomodation, R Delt bi tri grip trace, 2 minus R HF, 1/5 KE , ADF trace, Left side 4+/5,  Reduced sensation in RUE and RLE.  Speech with reduced output, strained quality, difficulty following commands Musc/Skel:  Normal Gen NAD Mood and affect are labile  Assessment/Plan: 1. 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 Physiatrist is providing close team supervision and 24 hour management of active medical problems listed below. Physiatrist and rehab team continue to assess barriers to discharge/monitor patient progress toward functional and medical goals. FIM: FIM - Bathing Bathing Steps Patient Completed: Chest;Right Arm;Abdomen;Front perineal area;Right upper leg;Left upper leg;Right lower leg (including foot);Left lower leg (including foot) Bathing: 3: Mod-Patient completes 5-7 41f 10 parts or 50-74%  FIM - Upper Body Dressing/Undressing Upper body dressing/undressing steps patient completed: Thread/unthread left sleeve of pullover shirt/dress;Pull shirt over trunk Upper body dressing/undressing: 0: Wears gown/pajamas-no public clothing FIM - Lower Body Dressing/Undressing Lower body dressing/undressing steps patient completed: Thread/unthread left pants leg;Don/Doff left sock;Thread/unthread left underwear leg Lower body dressing/undressing: 0: Wears gown/pajamas-no public clothing  FIM - Toileting Toileting steps completed by patient: Adjust clothing prior to toileting;Performs perineal hygiene;Adjust clothing after toileting Toileting: 0: Activity did not occur  FIM - Diplomatic Services operational officer Devices: Grab bars Toilet Transfers: 0-Activity did not occur  FIM - Event organiser Devices: Arm rests;Bed rails Bed/Chair Transfer: 3: Bed > Chair or W/C: Mod A (lift or lower assist)  FIM - Locomotion: Wheelchair Distance: 150 Locomotion: Wheelchair: 5: Travels 150 ft or more: maneuvers on rugs and over door sills with supervision, cueing or coaxing FIM - Locomotion: Ambulation Locomotion: Ambulation Assistive Devices:  (three Risk manager) Ambulation/Gait Assistance: 1: +2 Total assist (3 musketeers assist) Locomotion: Ambulation: 1: Two helpers  Comprehension Comprehension Mode: Auditory Comprehension: 3-Understands basic 50 - 74% of the time/requires cueing 25 - 50%  of the time  Expression Expression Mode: Verbal Expression Assistive Devices: 6-Other (Comment) (dysarthic and aphaisic) Expression: 3-Expresses basic 50 - 74% of the time/requires cueing 25 - 50% of the time. Needs to repeat parts of sentences.  Social Interaction Social Interaction: 3-Interacts appropriately 50 - 74%  of the time - May be physically or verbally inappropriate.  Problem Solving Problem Solving: 2-Solves basic 25 - 49% of the time - needs direction more than half the time to initiate, plan or complete simple activities  Memory Memory: 2-Recognizes or recalls 25 - 49% of the time/requires cueing 51 - 75% of the time Medical Problem List and Plan:  1. Embolic left PCA infarct, right hemiparesis, right hemisensory deficits, right homonymous hemianopsia  2. DVT Prophylaxis/Anticoagulation: Subcutaneous Lovenox. Monitor platelet counts and any signs of bleeding  3. Pain Management: Tylenol as needed. Monitor with increased mobility  4. Neuropsych: This patient is not capable of making decisions on his own behalf.  5. Dysphagia/aphasia. Dysphagia 1 and liquids. Monitor for any signs of aspiration. Followup speech therapy.  6. Diabetes mellitus with peripheral neuropathy, uncontrolled. Hemoglobin A1c 10.9. Lantus insulin titrate as per am CBG  Check CBGs a.c. and at  bedtime,SSI. Schedule meal coverage Provide diabetic teaching.  7. Hyperlipidemia. Lipitor  8. Emotional lability after stroke improving on sertraline  LOS (Days) 11 A FACE TO FACE EVALUATION WAS PERFORMED  KIRSTEINS,ANDREW E 06/20/2013, 7:00 AM

## 2013-06-20 NOTE — Progress Notes (Signed)
Resp Care Note;Pt refusing CPAP tonight. 

## 2013-06-21 ENCOUNTER — Inpatient Hospital Stay (HOSPITAL_COMMUNITY): Payer: BC Managed Care – PPO | Admitting: Physical Therapy

## 2013-06-21 ENCOUNTER — Inpatient Hospital Stay (HOSPITAL_COMMUNITY): Payer: BC Managed Care – PPO

## 2013-06-21 ENCOUNTER — Encounter (HOSPITAL_COMMUNITY): Payer: Self-pay

## 2013-06-21 DIAGNOSIS — G8110 Spastic hemiplegia affecting unspecified side: Secondary | ICD-10-CM

## 2013-06-21 DIAGNOSIS — I69921 Dysphasia following unspecified cerebrovascular disease: Secondary | ICD-10-CM

## 2013-06-21 DIAGNOSIS — I6330 Cerebral infarction due to thrombosis of unspecified cerebral artery: Secondary | ICD-10-CM

## 2013-06-21 LAB — GLUCOSE, CAPILLARY
Glucose-Capillary: 200 mg/dL — ABNORMAL HIGH (ref 70–99)
Glucose-Capillary: 147 mg/dL — ABNORMAL HIGH (ref 70–99)

## 2013-06-21 MED ORDER — TROLAMINE SALICYLATE 10 % EX CREA
TOPICAL_CREAM | Freq: Two times a day (BID) | CUTANEOUS | Status: DC | PRN
  Filled 2013-06-21: qty 85

## 2013-06-21 NOTE — Progress Notes (Signed)
Occupational Therapy Session Note  Patient Details  Name: Adam Callahan MRN: [id number] Date of Birth: 09/10/1960  Today's Date: 06/21/2013 Time: 0730-0830 Time Calculation (min): 60 min  Short Term Goals: Week 2:  OT Short Term Goal 1 (Week 2): Pt will complete LB dressing with mod assist consistently  OT Short Term Goal 2 (Week 2): Pt will complete bathing with min assist and no more then 1 verbal cue OT Short Term Goal 3 (Week 2): Pt will complete UB dressing with min assist and no cues for hemi dressing technique OT Short Term Goal 4 (Week 2): Pt will compete functional transfers with min verbal cues for awareness of RLE/RUE  Skilled Therapeutic Interventions/Progress Updates:    Pt seen for ADL retraining with focus on attention to R, functional transfers, activity tolerance, and postural control. Pt received supine in bed. Completed all functional transfers with min assist stand pivot and min verbal cues for positioning of feet on floor and for awareness of RUE. Completed bathing with no verbal cues for washing all body parts and required min-mod assist for dynamic standing balance during buttocks hygiene. Pt located soap on right side in shower with increased time and no cues. Completed dressing from w/c with verbal cues of hemi dressing technique and mod assist. Presented all items from R side and pt visually scanning to locate. Pt required cues and assist to manage RUE during sit<>stand. Pt assisted with clothing management around waist and required mod-min assist for standing balance with therapist facilitating hip alignment. Completed oral care in standing while weightbearing through RUE. At end of session pt left with quick release belt donned and RN present.    Therapy Documentation Precautions:  Precautions Precautions: Fall Precaution Comments: right inattention Restrictions Weight Bearing Restrictions: No General:   Vital Signs:   Pain: No report of pain during  therapy session.   See FIM for current functional status  Therapy/Group: Individual Therapy  Daneil Dan 06/21/2013, 9:27 AM

## 2013-06-21 NOTE — Progress Notes (Signed)
Speech Language Pathology Daily Session Note  Patient Details  Name: Adam Callahan MRN: [id number] Date of Birth: 1960-11-14  Today's Date: 06/21/2013 Time: 4098-1191 Time Calculation (min): 52 min  Short Term Goals: Week 2: SLP Short Term Goal 1 (Week 2): Pt will demonstrate effective mastication of Dys 2 textures with Min cues to assess readiness for advancement. SLP Short Term Goal 2 (Week 2): Pt will perform oral motor exercises with Max cues SLP Short Term Goal 3 (Week 2): Pt will use external aids to demonstrate orientation x4 with Max cues SLP Short Term Goal 4 (Week 2): Pt will name common objects with 80% accuracy with Max cues SLP Short Term Goal 5 (Week 2): Pt will identify at least 1 cognitive and 1 physical impairment with Max cues  Skilled Therapeutic Interventions: Treatment focused on cognitive-linguistic and dysphagia goals. SLP facilitated session with advanced PO trials of Dys 2 textures, consumed with cup sips of thin liquid. No overt s/s of aspiration observed with both mild anterior spillage and right-sided pocketing with solids. Recommend to trial an advanced tray with patient prior to diet upgrade. Pt with increased verbal expression today, primarily at the word to phrase level. Pt receptively identified pictures from a field of 3 with Mod cues to attend to right visual field. He required Max cues and models for confrontational naming of objects and actions with pictures, with closer approximation of words due to less extraneous syllables/phonemes. Pt counted 1-30 and sang familiar holiday song with Min cues for accuracy and Mod cues for use of speech intelligibility strategies. Upon completion of session, pt verbally communicated with Min question cues that he had to have a bowel movement. Pt demonstrated basic problem solving during transfer for toileting with Min cues. Pt unable to have BM; pt was left with PT who had arrived for next therapy session.   FIM:   Comprehension Comprehension Mode: Auditory Comprehension: 4-Understands basic 75 - 89% of the time/requires cueing 10 - 24% of the time Expression Expression Mode: Verbal Expression: 3-Expresses basic 50 - 74% of the time/requires cueing 25 - 50% of the time. Needs to repeat parts of sentences. Social Interaction Social Interaction: 4-Interacts appropriately 75 - 89% of the time - Needs redirection for appropriate language or to initiate interaction. Problem Solving Problem Solving: 2-Solves basic 25 - 49% of the time - needs direction more than half the time to initiate, plan or complete simple activities Memory Memory: 2-Recognizes or recalls 25 - 49% of the time/requires cueing 51 - 75% of the time FIM - Eating Eating Activity: 5: Needs verbal cues/supervision;4: Helper checks for pocketed food  Pain Pain Assessment Pain Assessment: No/denies pain Pain Score: 0-No pain Pain Type: Acute pain Pain Location: Abdomen Pain Orientation: Mid;Lower Pain Descriptors / Indicators: Aching Pain Onset: Gradual Patients Stated Pain Goal: 2 Pain Intervention(s): RN made aware;Rest;Repositioned Multiple Pain Sites: No  Therapy/Group: Individual Therapy   [name], M.A. CCC-SLP [phone]   [name] 06/21/2013, 5:01 PM

## 2013-06-21 NOTE — Progress Notes (Signed)
52 y.o. right-handed male with history of diabetes mellitus with peripheral neuropathy. Patient was independent prior to admission driving . Admitted 06/06/2013 with acute right-sided weakness and aphasia after being found on the floor by his family. MRI of the brain acute infarct left PCA territory. There is acute infarct involving the left mid and posterior temporal lobe extending into the left occipital lobe as well acute infarct present left thalamus and left midbrain, left inferior cerebellum. MRA of the head negative for aneurysm. MRI of the neck with occlusions versus embolus versus dissection of the left posterior cerebral artery and left posterior inferior cerebellar artery compatible with acute infarct. Echocardiogram with ejection fraction of 55% no wall motion abnormalities and grade 1 diastolic dysfunction. Carotid Dopplers with no ICA stenosis. Patient did not receive TPA. Neurology services consulted presently on aspirin and Plavix for CVA prophylaxis as well as subcutaneous Lovenox for DVT prophylaxis. Presently maintained on a dysphagia 1 thin liquid diet. Hemoglobin A1c 10.9 Subjective/Complaints: Aphasic and dysarthric.  Emotional lability improved Poor awareness of deficits Complained to RN of HA but with further questioning appears more post neck/occiput.  No pain with ROM, improved on hydrocodone Review of systems: Cannot obtain secondary to mental status  Objective: Vital Signs: Blood pressure 114/71, pulse 81, temperature 97.5 F (36.4 C), temperature source Oral, resp. rate 19, height 5\' 10"  (1.778 m), weight 76.204 kg (168 lb), SpO2 96.00%. No results found. Results for orders placed during the hospital encounter of 06/09/13 (from the past 72 hour(s))  GLUCOSE, CAPILLARY     Status: Abnormal   Collection Time    06/18/13  7:40 AM      Result Value Range   Glucose-Capillary 141 (*) 70 - 99 mg/dL  GLUCOSE, CAPILLARY     Status: Abnormal   Collection Time    06/18/13 12:03  PM      Result Value Range   Glucose-Capillary 256 (*) 70 - 99 mg/dL  GLUCOSE, CAPILLARY     Status: Abnormal   Collection Time    06/18/13  4:58 PM      Result Value Range   Glucose-Capillary 165 (*) 70 - 99 mg/dL  GLUCOSE, CAPILLARY     Status: Abnormal   Collection Time    06/18/13  8:36 PM      Result Value Range   Glucose-Capillary 183 (*) 70 - 99 mg/dL  GLUCOSE, CAPILLARY     Status: Abnormal   Collection Time    06/19/13  7:44 AM      Result Value Range   Glucose-Capillary 161 (*) 70 - 99 mg/dL  GLUCOSE, CAPILLARY     Status: Abnormal   Collection Time    06/19/13 11:30 AM      Result Value Range   Glucose-Capillary 216 (*) 70 - 99 mg/dL  GLUCOSE, CAPILLARY     Status: Abnormal   Collection Time    06/19/13  1:05 PM      Result Value Range   Glucose-Capillary 210 (*) 70 - 99 mg/dL   Comment 1 Documented in Chart    GLUCOSE, CAPILLARY     Status: Abnormal   Collection Time    06/19/13  3:52 PM      Result Value Range   Glucose-Capillary 187 (*) 70 - 99 mg/dL  GLUCOSE, CAPILLARY     Status: Abnormal   Collection Time    06/19/13  5:25 PM      Result Value Range   Glucose-Capillary 152 (*) 70 - 99 mg/dL  GLUCOSE, CAPILLARY     Status: Abnormal   Collection Time    06/19/13  9:08 PM      Result Value Range   Glucose-Capillary 228 (*) 70 - 99 mg/dL  GLUCOSE, CAPILLARY     Status: Abnormal   Collection Time    06/20/13  7:25 AM      Result Value Range   Glucose-Capillary 156 (*) 70 - 99 mg/dL  GLUCOSE, CAPILLARY     Status: Abnormal   Collection Time    06/20/13 12:08 PM      Result Value Range   Glucose-Capillary 144 (*) 70 - 99 mg/dL  GLUCOSE, CAPILLARY     Status: Abnormal   Collection Time    06/20/13  4:23 PM      Result Value Range   Glucose-Capillary 134 (*) 70 - 99 mg/dL  GLUCOSE, CAPILLARY     Status: Abnormal   Collection Time    06/20/13  8:35 PM      Result Value Range   Glucose-Capillary 114 (*) 70 - 99 mg/dL     HEENT: normal Cardio:  RRR and no murmur Resp: CTA B/L and unlabored GI: BS positive and NT, non distended Extremity:  Pulses positive and No Edema Skin:   Intact Neuro: Lethargic, Flat and Cranial Nerve Abnormalities decreased accomodation, R Delt bi tri grip trace, 2 minus R HF, 1/5 KE , ADF trace, Left side 4+/5,  Reduced sensation in RUE and RLE.  Speech with reduced output, strained quality, difficulty following commands Musc/Skel:  Normal Gen NAD Mood and affect are labile  Assessment/Plan: 1. 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 Physiatrist is providing close team supervision and 24 hour management of active medical problems listed below. Physiatrist and rehab team continue to assess barriers to discharge/monitor patient progress toward functional and medical goals. FIM: FIM - Bathing Bathing Steps Patient Completed: Chest;Right Arm;Abdomen;Front perineal area;Right upper leg;Left upper leg;Right lower leg (including foot);Left lower leg (including foot) Bathing: 3: Mod-Patient completes 5-7 75f 10 parts or 50-74%  FIM - Upper Body Dressing/Undressing Upper body dressing/undressing steps patient completed: Thread/unthread left sleeve of pullover shirt/dress;Pull shirt over trunk Upper body dressing/undressing: 0: Wears gown/pajamas-no public clothing FIM - Lower Body Dressing/Undressing Lower body dressing/undressing steps patient completed: Thread/unthread left pants leg;Don/Doff left sock;Thread/unthread left underwear leg Lower body dressing/undressing: 0: Wears gown/pajamas-no public clothing  FIM - Toileting Toileting steps completed by patient: Adjust clothing prior to toileting;Performs perineal hygiene;Adjust clothing after toileting Toileting: 0: Activity did not occur  FIM - Scientist, research (physical sciences) Devices: Grab bars Toilet Transfers: 0-Activity did not occur  FIM - Banker Devices: Arm rests;Bed rails Bed/Chair Transfer: 3: Bed > Chair or W/C: Mod A (lift or lower assist)  FIM - Locomotion: Wheelchair Distance: 150 Locomotion: Wheelchair: 5: Travels 150 ft or more: maneuvers on rugs and over door sills with supervision, cueing or coaxing FIM - Locomotion: Ambulation Locomotion: Ambulation Assistive Devices:  (three Risk manager) Ambulation/Gait Assistance: 1: +2 Total assist (3 musketeers assist) Locomotion: Ambulation: 1: Two helpers  Comprehension Comprehension Mode: Auditory Comprehension: 4-Understands basic 75 - 89% of the time/requires cueing 10 - 24% of the time  Expression Expression Mode: Verbal Expression Assistive Devices: 6-Other (Comment) (dysarthic and aphaisic) Expression: 3-Expresses basic 50 - 74% of the time/requires cueing 25 - 50%  of the time. Needs to repeat parts of sentences.  Social Interaction Social Interaction: 3-Interacts appropriately 50 - 74% of the time - May be physically or verbally inappropriate.  Problem Solving Problem Solving: 2-Solves basic 25 - 49% of the time - needs direction more than half the time to initiate, plan or complete simple activities  Memory Memory: 3-Recognizes or recalls 50 - 74% of the time/requires cueing 25 - 49% of the time Medical Problem List and Plan:  1. Embolic left PCA infarct, right hemiparesis, right hemisensory deficits, right homonymous hemianopsia  2. DVT Prophylaxis/Anticoagulation: Subcutaneous Lovenox. Monitor platelet counts and any signs of bleeding  3. Pain Management: Tylenol as needed. Left neck and occipital pain improved with low dose hydrocodone, not long term treatment will try kpad an sportscreme 4. Neuropsych: This patient is not capable of making decisions on his own behalf.  5. Dysphagia/aphasia. Dysphagia 1 and liquids. Monitor for  any signs of aspiration. Followup speech therapy.  6. Diabetes mellitus with peripheral neuropathy, uncontrolled. Hemoglobin A1c 10.9. Lantus insulin titrate as per am CBG  Check CBGs a.c. and at bedtime,SSI. Schedule meal coverage Provide diabetic teaching.  7. Hyperlipidemia. Lipitor  8. Emotional lability after stroke improving on sertraline  LOS (Days) 12 A FACE TO FACE EVALUATION WAS PERFORMED  KIRSTEINS,ANDREW E 06/21/2013, 7:16 AM

## 2013-06-21 NOTE — Progress Notes (Addendum)
Physical Therapy Session Note  Patient Details  Name: JOVONTE COMMINS MRN: [id number] Date of Birth: [birth year]  Today's Date: 06/21/2013 Time: 0905-1005; [id number]; and 1513-1530 Time Calculation (min): 60 min; 15 min; and 17 min  Short Term Goals: Week 2:  PT Short Term Goal 1 (Week 2): Pt will perform dynamic standing activities with consistent mod A. PT Short Term Goal 2 (Week 2): Patient will perform supine<>sit with supervision. PT Short Term Goal 3 (Week 2): Patient will perform w/c<>mat transfer with consistent min A. PT Short Term Goal 4 (Week 2): Patient will ambulate 64' with total A of single therapist.  Skilled Therapeutic Interventions/Progress Updates:    Treatment Session 1: Pt received seated in w/c at nurses station. A&Ox3; oriented to self, place, and situation; unable to correctly select current month when given 2 choices. Session focused on increasing pt stability/independence with dynamic sitting balance, static standing balance, management of w/c parts and gait. Self-propulsion of w/c via L hemi-technique x150' with min A for obstacle negotiation on R side; sequential trial x150' with supervision, effective obstacle negotiation. Pt managed L w/c brake, L leg rest with supervision, cueing, and increased time; managed R w/c brake with mod cueing and increased time; and managed R leg rest with min A and hand-over-hand assist. Performed low-pivot from w/c<>mat with minA, w/c arm rests.   Dynamic sitting balance x7 min with concurrent pt performance of cognitive task (copying design on peg board using 5 pegs). Pt correctly recreated L side of cross shape. With cueing, pt acknowledged R side of design; however, therapist unable to elicit pt correction of shape. Multiple static standing trials with LUE at hallway hand rail with min-max A for stability/balance secondary for pt tendency to perform sit>stand with majority of weight on LLE, then attempt to cross LE's once in standing.  Demonstration cueing for normal BOS. Manual facilitation of RLE weightbearing during sit>stand. Once in standing, manual approximation at R knee to promote carryover of RLE positioning. Pt performed final sit>stand, standing trial x15 seconds with effective carryover RLE weightbearing and no LE scissoring. Gait x16' with LUE at hallway hand rail, therapist assisting 50-75% throughout with +2A for w/c follow. Manual facilitation at L ribs, R axilla for upright posture. Initial 3 steps with step-through gait pattern; remaining steps with step-to pattern.Therapist transported pt in w/c to nurses station, where pt was left seated in w/c with half lap tray and quick release belt in place for safety, and all needs within reach.  Treatment Session 2: Pt received seated in w/c in room having just finished speech therapy session. Speech therapist reports abdominal pain secondary to pt-perceived need to have bowel movement; RN notified. Pt requests to return to bed secondary to abdominal pain but agrees to activity in bed. Squat-pivot from w/c>bed (to L side) with mod A. Scooting to L side with supervision/cueing. Sit>supine with supervision, demonstration cueing to remind pt to elevate RLE using LLE; pt with effective return demonstration. Scooting to [facility] with min A. See below for description of RLE NMR. Therapist departed pt room with pt semi-reclined in bed with bed alarm on, 3 bed rails up, and all needs within reach.   (NMR) Supine: RLE D2 flexion/extension x2 reps rhythmic initiation, x15 reps, x11 reps resisted; 30-60 second rest breaks taken between each set. Manual facilitation focused at R ankle dorsiflexors to promote ankle dorsiflexion during R hip/knee flexion. No active R ankle dorsiflexion palpable. With manual facilitation of R hip adduction/internal rotation with concurrent hip/knee flexion,  pt gave effective return demonstration for remaining trials.  Treatment Session 3: Pt received seated in w/c at  nurses station; agreeable to session. Reports no pain. Session focused on increasing pt independence with w/c mobility and sit<>stand transfers. . Self-propulsion of w/c via L hemi-technique x200' with min A for obstacle negotiation on R side. Pt performed multiple sit<>stand transfers from w/c. Initial 4 transfers with LUE at hallway hand rail with minA-supervision (less assist required with increased # of attempts). Demonstration cueing to promote use of L w/c arm rest as opposed to L hallway handrail. Pt then perform 6 additional sit<>stands without use of hallway hand rail. Static standing >30 seconds without UE support, close supervision; no LOB. W/c mobility x100' with L hemi-technique, min A for obstacle negotiation and for trunk control (secondary to excessive anterior trunk lean). Therapist departed with pt seated in w/c with half lap tray and quick release belt in place for safety, and all needs within reach.   Therapy Documentation Precautions:  Precautions Precautions: Fall Precaution Comments: right inattention Restrictions Weight Bearing Restrictions: No General: Amount of Missed PT Time (min): 15 Minutes Missed Time Reason: Pain Pain: Pain Assessment Pain Assessment: FLACC Pain Score: 6  Faces Pain Scale: Hurts whole lot Pain Type: Acute pain Pain Location: Abdomen Pain Orientation: Mid;Lower Pain Descriptors / Indicators: Aching Pain Onset: Gradual Patients Stated Pain Goal: 2 Pain Intervention(s): RN made aware;Rest;Repositioned Multiple Pain Sites: No  See FIM for current functional status  Therapy/Group: Individual Therapy  Juliah Scadden, Lorenda Ishihara 06/21/2013, 2:51 PM

## 2013-06-21 NOTE — Plan of Care (Signed)
Problem: RH BLADDER ELIMINATION Goal: RH STG MANAGE BLADDER WITH ASSISTANCE STG Manage Bladder With Mod A  Outcome: Progressing Wears condom cath @ hs  Problem: RH SKIN INTEGRITY Goal: RH STG MAINTAIN SKIN INTEGRITY WITH ASSISTANCE STG Maintain Skin Integrity With min Assistance.  Outcome: Progressing Open area to Rt knee covered with tegaderm  Problem: RH SAFETY Goal: RH STG ADHERE TO SAFETY PRECAUTIONS W/ASSISTANCE/DEVICE STG Adhere to Safety Precautions With mod Assistance/Device.  Outcome: Not Progressing Pt requires a sitter @ hs

## 2013-06-21 NOTE — Progress Notes (Signed)
Patient refused CPAP at this time. Machine remains set up in room.  Pt. Encouraged to call RT if he would like to wear CPAP.

## 2013-06-22 ENCOUNTER — Inpatient Hospital Stay (HOSPITAL_COMMUNITY): Payer: BC Managed Care – PPO

## 2013-06-22 ENCOUNTER — Inpatient Hospital Stay (HOSPITAL_COMMUNITY): Payer: BC Managed Care – PPO | Admitting: Physical Therapy

## 2013-06-22 LAB — GLUCOSE, CAPILLARY
Glucose-Capillary: 146 mg/dL — ABNORMAL HIGH (ref 70–99)
Glucose-Capillary: 97 mg/dL (ref 70–99)
Glucose-Capillary: 211 mg/dL — ABNORMAL HIGH (ref 70–99)

## 2013-06-22 MED ORDER — TROLAMINE SALICYLATE 10 % EX CREA
TOPICAL_CREAM | Freq: Two times a day (BID) | CUTANEOUS | Status: DC
  Administered 2013-06-22 – 2013-06-27 (×11): via TOPICAL
  Administered 2013-06-27: 1 via TOPICAL
  Administered 2013-06-28 – 2013-06-29 (×2): via TOPICAL
  Filled 2013-06-22: qty 85

## 2013-06-22 NOTE — Progress Notes (Signed)
Speech Language Pathology Daily Session Note  Patient Details  Name: Adam Callahan MRN: [id number] Date of Birth: December 25, 1960  Today's Date: 06/22/2013 Time: 1130-1215 Time Calculation (min): 45 min  Short Term Goals: Week 2: SLP Short Term Goal 1 (Week 2): Pt will demonstrate effective mastication of Dys 2 textures with Min cues to assess readiness for advancement. SLP Short Term Goal 2 (Week 2): Pt will perform oral motor exercises with Max cues SLP Short Term Goal 3 (Week 2): Pt will use external aids to demonstrate orientation x4 with Max cues SLP Short Term Goal 4 (Week 2): Pt will name common objects with 80% accuracy with Max cues SLP Short Term Goal 5 (Week 2): Pt will identify at least 1 cognitive and 1 physical impairment with Max cues  Skilled Therapeutic Interventions: Treatment focused on cognitive-linguistic and swallowing goals. Unfortunately, Dys 1 tray was delivered for lunch instead of the planned trial of Dys 2 textures. Pt not wanting to eat and grabbing his abdomen to the right. Pt communicated with Mod question cues that he was feeling pain (unable to quantify) and wanted to try to have a bowel movement. Pt required Mod cues for safety awareness during transfers; unable to have BM. With Min question cues, pt communicated that he wanted to return to his bed and followed one-step commands with supervision. Pt consumed ~50% of meal tray seated upright in bed with Min cues for anterior spillage and oral clearance. Although pt was not directly observed with Dys 2 tray, he has been consuming Dys 2 trials in therapy with Min cues and is receiving full supervision during meals to utilize safe swallowing strategies. Recommend to trial Dys 2 textures for dinner and SLP will f/u at meal tomorrow. RN made aware of pain, toileting, and diet advancement.   FIM:  Comprehension Comprehension Mode: Auditory Comprehension: 4-Understands basic 75 - 89% of the time/requires cueing 10 - 24%  of the time Expression Expression Mode: Verbal Expression: 3-Expresses basic 50 - 74% of the time/requires cueing 25 - 50% of the time. Needs to repeat parts of sentences. Social Interaction Social Interaction: 4-Interacts appropriately 75 - 89% of the time - Needs redirection for appropriate language or to initiate interaction. Problem Solving Problem Solving: 2-Solves basic 25 - 49% of the time - needs direction more than half the time to initiate, plan or complete simple activities Memory Memory: 2-Recognizes or recalls 25 - 49% of the time/requires cueing 51 - 75% of the time FIM - Eating Eating Activity: 3: Helper scoops food on utensil every scoop  Pain Pain Assessment Pain Assessment: Faces Pain Score: 0-No pain Faces Pain Scale: Hurts little more Pain Location: Abdomen Pain Intervention(s): RN made aware;Other (Comment) (attempted toileting)  Therapy/Group: Individual Therapy  [name], M.A. CCC-SLP [phone]   [name] 06/22/2013, 1:27 PM

## 2013-06-22 NOTE — Progress Notes (Signed)
Social Work [name], male   DOB: 01-25-1961, 52 y.o.   MRN: [id number] Notary here-Ashley to notarize pt's POA papers for his Aunt.  Pt agreeable to this and completed form.  Aunt to file with clerk.

## 2013-06-22 NOTE — Progress Notes (Signed)
RT note: Rt called to room, placed pt on CPAP via nasal mask per pt. Request.  Auto titrate settings.  Pt. Tolerating well at this time. RN aware.

## 2013-06-22 NOTE — Progress Notes (Signed)
Physical Therapy Session Note  Patient Details  Name: Adam Callahan MRN: [id number] Date of Birth: 12/20/1960  Today's Date: 06/22/2013 Time: 0930-1030 and 1500-1530 Time Calculation (min): 60 min and 30 min  Short Term Goals: Week 2:  PT Short Term Goal 1 (Week 2): Pt will perform dynamic standing activities with consistent mod A. PT Short Term Goal 2 (Week 2): Patient will perform supine<>sit with supervision. PT Short Term Goal 3 (Week 2): Patient will perform w/c<>mat transfer with consistent min A. PT Short Term Goal 4 (Week 2): Patient will ambulate 84' with total A of single therapist.  Skilled Therapeutic Interventions/Progress Updates:    Treatment Session 1: Pt received seated in w/c at nurses station; agreeable to therapy session. Oriented x3 to self, place, and situation. Session focused on increasing pt independence with w/c mobility, management of w/c parts, functional transfers, functional standing balance, and gait. Self-propulsion of w/c via L hemi-technique x150' with min A for obstacle negotiation, x150' with supervision. Multiple sit<>stand transfers from w/c with close supervision to min guard. See below for detailed description of dynamic standing balance activities and NMR. Squat-pivot from w/c>mat (to R side) with min guard, from mat>w/c (to L side) with min A for stability. Sit>supine with min A for RLE management; supine>sit with supervision. Gait x12', x5' with step-to pattern, LUE support at hallway handrail, 50-75% assist for stability, +2A for w/c follow. Manual facilitation of weight shift to R prior to initiation of LLE advancement. Multimodal cueing provided in attempt to slow pt's gait speed to allow for manual stabilization of R knee prior to LLE advancement. Throughout session, pt managed bilat w/c brakes, leg rests with supervision and increased time; pt managed w/c arm rests with min A and min cueing. Therapist departed with pt seated in w/c at nurses station  with quick release belt on for safety, half lap tray in place, and all needs within reach.  Treatment Session 2: Pt received supine in bed; asleep but easily awakened. Pt clothing and bed noted to be soiled secondary to urinary incontinence. Bilat rolling with supervision, bed rails to change bed linens and clothing. Supine>sit with supervision, bed rails. Bed>w/c (to R side) with min A. Self-propulsion of w/c using L hemi-technique x150' with supervision/cueing for obstacle negotiation on R side. Once in gym, pt performed multiple sit<>stand transfers from w/c at parallel bars with close supervision. With LUE support at parallel bars, performed RLE stepping using red tape as visual aid to address RLE scissoring. Also attempted manual facilitation of normalized R position with step; pt with inconsistent within-session carryover. Transported pt to room in w/c secondary to pt fatigue. Therapist departed with pt seated in w/c in room with quick release belt on for safety, half lap trap in place, social worker and pt family member present, and all needs within reach.  Therapy Documentation Precautions:  Precautions Precautions: Fall Precaution Comments: right inattention Restrictions Weight Bearing Restrictions: No Pain: Pain Assessment Pain Assessment: Faces Faces Pain Scale: Hurts little more Pain Location: Abdomen Pain Intervention(s): RN made aware;Other (Comment) (attempted toileting) Balance/NMR:   AM session: dynamic standing balance (while playing checkers at waist-level) x5.5 minutes, x 2.75 min (seated rest break x1 minutes between trials) with LUE support requiring minA-modA for balance. During dynamic standing activity, instructed pt to perform LUE reaching across midline and provided concurrent manual facilitation of RLE weightbearing. Pt actively stabilized R knee during both trials without assist. In supine, performed mutliple repetitions of resisted RLE D2 flexion/extension to promote  functional movement associated with gait. Pt with improved hip abduction during D2 extension.  See FIM for current functional status  Therapy/Group: Individual Therapy  Calvert Cantor 06/22/2013, 2:33 PM

## 2013-06-22 NOTE — Progress Notes (Signed)
Occupational Therapy Session Note  Patient Details  Name: Adam Callahan MRN: [id number] Date of Birth: Aug 21, 1960  Today's Date: 06/22/2013 Time: 0730-0830 Time Calculation (min): 60 min  Short Term Goals: Week 2:  OT Short Term Goal 1 (Week 2): Pt will complete LB dressing with mod assist consistently  OT Short Term Goal 2 (Week 2): Pt will complete bathing with min assist and no more then 1 verbal cue OT Short Term Goal 3 (Week 2): Pt will complete UB dressing with min assist and no cues for hemi dressing technique OT Short Term Goal 4 (Week 2): Pt will compete functional transfers with min verbal cues for awareness of RLE/RUE  Skilled Therapeutic Interventions/Progress Updates:    Pt seen for ADL retraining with focus on attention to R, functional transfers, postural control in standing, and recall of hemi dressing techniques. Pt required min verbal cues for techniques during functional transfers and for awareness of RUE/RLE. Required mod assist for stand pivot transfer to right w/c>toilet then min assist for all other transfers going left and right. Pt required min verbal cues for crossover technique for LB bathing and dressing. Pt attended to R side during bathing and dressing with min cues. Pt visually scanning to right and reported not having double vision able to tell time on clock locate other items in visual field without difficulty. Pt with mod verbal cues for hemi dressing technique after therapist allowed pt for trial and error attempts. Required assist for crossover technique with RLE and assisted with using RUE as stabilizer to hold self-care items. Stood at sink with min-mod assist and swaying to increase weightbearing through RLE and RUE. Pt required manual facilitation hip alignment. At end of session completed 10 reps of 3 self-ROM exercises and pt requiring visual demonstration. Pt left sitting in w/c with quick release belt donned, lap tray, and all needs in reach.   Therapy  Documentation Precautions:  Precautions Precautions: Fall Precaution Comments: right inattention Restrictions Weight Bearing Restrictions: No General:   Vital Signs:   Pain: No c/o pain during therapy session.    Other Treatments:    See FIM for current functional status  Therapy/Group: Individual Therapy  Daneil Dan 06/22/2013, 12:15 PM

## 2013-06-22 NOTE — Progress Notes (Signed)
52 y.o. right-handed male with history of diabetes mellitus with peripheral neuropathy. Patient was independent prior to admission driving . Admitted 06/06/2013 with acute right-sided weakness and aphasia after being found on the floor by his family. MRI of the brain acute infarct left PCA territory. There is acute infarct involving the left mid and posterior temporal lobe extending into the left occipital lobe as well acute infarct present left thalamus and left midbrain, left inferior cerebellum. MRA of the head negative for aneurysm. MRI of the neck with occlusions versus embolus versus dissection of the left posterior cerebral artery and left posterior inferior cerebellar artery compatible with acute infarct. Echocardiogram with ejection fraction of 55% no wall motion abnormalities and grade 1 diastolic dysfunction. Carotid Dopplers with no ICA stenosis. Patient did not receive TPA. Neurology services consulted presently on aspirin and Plavix for CVA prophylaxis as well as subcutaneous Lovenox for DVT prophylaxis. Presently maintained on a dysphagia 1 thin liquid diet. Hemoglobin A1c 10.9 Subjective/Complaints: Aphasic and dysarthric.  Emotional lability improved Poor awareness of deficits Complains of Left neck/occiput pain mild/mod Review of systems: Cannot obtain secondary to mental status  Objective: Vital Signs: Blood pressure 144/87, pulse 80, temperature 98 F (36.7 C), temperature source Oral, resp. rate 18, height 5\' 10"  (1.778 m), weight 76.204 kg (168 lb), SpO2 97.00%. No results found. Results for orders placed during the hospital encounter of 06/09/13 (from the past 72 hour(s))  GLUCOSE, CAPILLARY     Status: Abnormal   Collection Time    06/19/13  7:44 AM      Result Value Range   Glucose-Capillary 161 (*) 70 - 99 mg/dL  GLUCOSE, CAPILLARY     Status: Abnormal   Collection Time    06/19/13 11:30 AM      Result Value Range   Glucose-Capillary 216 (*) 70 - 99 mg/dL  GLUCOSE,  CAPILLARY     Status: Abnormal   Collection Time    06/19/13  1:05 PM      Result Value Range   Glucose-Capillary 210 (*) 70 - 99 mg/dL   Comment 1 Documented in Chart    GLUCOSE, CAPILLARY     Status: Abnormal   Collection Time    06/19/13  3:52 PM      Result Value Range   Glucose-Capillary 187 (*) 70 - 99 mg/dL  GLUCOSE, CAPILLARY     Status: Abnormal   Collection Time    06/19/13  5:25 PM      Result Value Range   Glucose-Capillary 152 (*) 70 - 99 mg/dL  GLUCOSE, CAPILLARY     Status: Abnormal   Collection Time    06/19/13  9:08 PM      Result Value Range   Glucose-Capillary 228 (*) 70 - 99 mg/dL  GLUCOSE, CAPILLARY     Status: Abnormal   Collection Time    06/20/13  7:25 AM      Result Value Range   Glucose-Capillary 156 (*) 70 - 99 mg/dL  GLUCOSE, CAPILLARY     Status: Abnormal   Collection Time    06/20/13 12:08 PM      Result Value Range   Glucose-Capillary 144 (*) 70 - 99 mg/dL  GLUCOSE, CAPILLARY     Status: Abnormal   Collection Time    06/20/13  4:23 PM      Result Value Range   Glucose-Capillary 134 (*) 70 - 99 mg/dL  GLUCOSE, CAPILLARY     Status: Abnormal   Collection Time  06/20/13  8:35 PM      Result Value Range   Glucose-Capillary 114 (*) 70 - 99 mg/dL  GLUCOSE, CAPILLARY     Status: Abnormal   Collection Time    06/21/13  7:33 AM      Result Value Range   Glucose-Capillary 155 (*) 70 - 99 mg/dL  GLUCOSE, CAPILLARY     Status: Abnormal   Collection Time    06/21/13 12:00 PM      Result Value Range   Glucose-Capillary 200 (*) 70 - 99 mg/dL  GLUCOSE, CAPILLARY     Status: Abnormal   Collection Time    06/21/13  4:14 PM      Result Value Range   Glucose-Capillary 147 (*) 70 - 99 mg/dL  GLUCOSE, CAPILLARY     Status: Abnormal   Collection Time    06/21/13  8:23 PM      Result Value Range   Glucose-Capillary 141 (*) 70 - 99 mg/dL     HEENT: normal Cardio: RRR and no murmur Resp: CTA B/L and unlabored GI: BS positive and NT, non  distended Extremity:  Pulses positive and No Edema Skin:   Intact Neuro: Lethargic, Flat and Cranial Nerve Abnormalities decreased accomodation, R o/5 Delt bi,trace tri, 0/5grip  3 minus R HF, 1/5 KE , ADF trace, Left side 4+/5,  Reduced sensation in RUE and RLE.  Speech with reduced output, strained quality, difficulty following commands Musc/Skel:  Normal, neck mild tenderness on left Gen NAD Mood and affect are labile  Assessment/Plan: 1. 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 Physiatrist is providing close team supervision and 24 hour management of active medical problems listed below. Physiatrist and rehab team continue to assess barriers to discharge/monitor patient progress toward functional and medical goals. FIM: FIM - Bathing Bathing Steps Patient Completed: Chest;Right Arm;Abdomen;Front perineal area;Right upper leg;Left upper leg;Right lower leg (including foot);Left lower leg (including foot) Bathing: 4: Min-Patient completes 8-9 59f 10 parts or 75+ percent  FIM - Upper Body Dressing/Undressing Upper body dressing/undressing steps patient completed: Thread/unthread left sleeve of pullover shirt/dress;Pull shirt over trunk Upper body dressing/undressing: 3: Mod-Patient completed 50-74% of tasks FIM - Lower Body Dressing/Undressing Lower body dressing/undressing steps patient completed: Thread/unthread left pants leg;Don/Doff left sock;Thread/unthread left underwear leg Lower body dressing/undressing: 3: Mod-Patient completed 50-74% of tasks  FIM - Toileting Toileting steps completed by patient: Adjust clothing prior to toileting;Performs perineal hygiene;Adjust clothing after toileting Toileting: 0: Activity did not occur  FIM - Diplomatic Services operational officer Devices: Grab  bars Toilet Transfers: 0-Activity did not occur  FIM - Banker Devices: Bed rails;Arm rests Bed/Chair Transfer: 5: Sit > Supine: Supervision (verbal cues/safety issues);3: Chair or W/C > Bed: Mod A (lift or lower assist)  FIM - Locomotion: Wheelchair Distance: 200 Locomotion: Wheelchair: 4: Travels 150 ft or more: maneuvers on rugs and over door sillls with minimal assistance (Pt.>75%) FIM - Locomotion: Ambulation Locomotion: Ambulation Assistive Devices: Other (comment) (L hand rail) Ambulation/Gait Assistance: 1: +2 Total assist (3 musketeers assist) Locomotion: Ambulation: 1: Two helpers  Comprehension Comprehension Mode: Auditory Comprehension: 4-Understands basic 75 - 89% of the time/requires cueing 10 - 24% of the time  Expression Expression Mode: Verbal Expression Assistive Devices: 6-Other (Comment) (dysarthic and aphaisic) Expression: 3-Expresses basic 50 - 74% of the time/requires cueing 25 -  50% of the time. Needs to repeat parts of sentences.  Social Interaction Social Interaction: 4-Interacts appropriately 75 - 89% of the time - Needs redirection for appropriate language or to initiate interaction.  Problem Solving Problem Solving: 2-Solves basic 25 - 49% of the time - needs direction more than half the time to initiate, plan or complete simple activities  Memory Memory: 2-Recognizes or recalls 25 - 49% of the time/requires cueing 51 - 75% of the time Medical Problem List and Plan:  1. Embolic left PCA infarct, right hemiparesis, right hemisensory deficits, right homonymous hemianopsia  2. DVT Prophylaxis/Anticoagulation: Subcutaneous Lovenox. Monitor platelet counts and any signs of bleeding  3. Pain Management: Tylenol as needed. Left neck and occipital pain improved with low dose hydrocodone, not long term treatment will try kpad and sportscreme 4. Neuropsych: This patient is not capable of making decisions on his own behalf.   5. Dysphagia/aphasia. Dysphagia 1 and liquids. Monitor for any signs of aspiration. Followup speech therapy.  6. Diabetes mellitus with peripheral neuropathy, uncontrolled. Hemoglobin A1c 10.9. Lantus insulin titrate as per am CBG  Check CBGs a.c. and at bedtime,SSI. Schedule meal coverage Provide diabetic teaching.  7. Hyperlipidemia. Lipitor  8. Emotional lability after stroke improving on sertraline  LOS (Days) 13 A FACE TO FACE EVALUATION WAS PERFORMED  Brailee Riede E 06/22/2013, 7:13 AM

## 2013-06-22 NOTE — Consult Note (Signed)
NEUROBEHAVIORAL STATUS EXAM - [facility]   MEDICAL NECESSITY:  Caige Almeda was seen on the [facility] for neurocognitive testing owing to the patient's diagnosis of stroke.   According to medical records, Mr. Derrig was admitted to the rehab unit owing to functional deficits secondary to left PCA thrombotic infarct with right hemiparesis, right homonymous hemianopsia, and right hemisensory deficits. Records indicated that he was independent prior to admission. He was admitted on 06/06/2013 with acute right-sided weakness and aphasia after being found on the floor by his family. MRI of the brain revealed an acute infarct in the left PCA territory, infarct involving the left middle and posterior temporal lobe extending into the left occipital lobe. Infarcts were also noted in the left thalamus and left midbrain, and left inferior cerebellum.   During today's visit, Mr. Borquez was not a good historian owing to severe aphasia. He responded invariably "yes" to all questions no matter the content. He was dysarthric and had trouble understanding questions. He admitted to experiencing major cognitive deficits post stroke but then denied them when the direction of the question changed. As such, it was very difficult to assess his subjective experience given his present medical situation.   The patient was referred for neuropsychological consultation given the possibility of cognitive sequelae subsequent to the current medical status and in order to assist in treatment planning.   PROCEDURES ADMINISTERED: [1 unit of [address] on 06/21/13] Diagnostic clinical interview  Review of available records Mental Status Exam-2 (brief version)  MENTAL STATUS: Mr. Rebello' mental status exam score of 2/16 is well below the cutoff used to indicate severe cognitive impairment or frank dementia. Most of his trouble was owing to severe aphasia. He had  trouble comprehending questions and could not repeat. He perseveratively answered a lot of questions with similar responses such as "yes" or "now" or "now 1."   Emotional & Behavioral Evaluation: Mr. Riolo was appropriately dressed for season and situation, and he appeared tidy and well-groomed. Normal posture was noted. He yawned loudly and repeatedly throughout the interview and mental status exam. His speech was dysarthric and he seems to be suffering from a great deal of aphasia affecting fluency, comprehension, and repetition. He did not always understand test directions. His affect was flat. Attention was poor. Optimal test taking conditions were maintained.   From an emotional standpoint, Mr. Kopf was unable to accurately describe his mood owing to aphasia. His affect was flat but he did not seem overtly depressed.   No major adjustment issues to his hospital stay were noted.    Overall, Mr. Zimbelman seems to be experiencing a significant degree of cognitive deficit secondary to stroke. He is severely aphasic which adversely affected all facets of testing. It is my hope that with time his cognition and aphasia will clear to a degree. As such, interval testing should be attempted prior to discharge. If not able, then testing as an outpatient is warranted. No major mood factors were apparent.    RECOMMENDATIONS    Repeat mental status exam prior to discharge    A family education session will be scheduled by his social worker    Debbe Mounts, Psy.D.  Clinical Neuropsychologist

## 2013-06-23 ENCOUNTER — Inpatient Hospital Stay (HOSPITAL_COMMUNITY): Payer: BC Managed Care – PPO

## 2013-06-23 ENCOUNTER — Inpatient Hospital Stay (HOSPITAL_COMMUNITY): Payer: BC Managed Care – PPO | Admitting: Physical Therapy

## 2013-06-23 DIAGNOSIS — I69991 Dysphagia following unspecified cerebrovascular disease: Secondary | ICD-10-CM

## 2013-06-23 LAB — GLUCOSE, CAPILLARY
Glucose-Capillary: 158 mg/dL — ABNORMAL HIGH (ref 70–99)
Glucose-Capillary: 151 mg/dL — ABNORMAL HIGH (ref 70–99)
Glucose-Capillary: 152 mg/dL — ABNORMAL HIGH (ref 70–99)

## 2013-06-23 LAB — CREATININE, SERUM
Creatinine, Ser: 0.69 mg/dL (ref 0.50–1.35)
GFR calc Af Amer: >90 mL/min (ref >90)
GFR calc non Af Amer: >90 mL/min (ref >90)

## 2013-06-23 MED ORDER — BISACODYL 10 MG RE SUPP
10.0000 mg | Freq: Every day | RECTAL | Status: DC | PRN
  Administered 2013-06-25: 10 mg via RECTAL
  Filled 2013-06-23: qty 1

## 2013-06-23 MED ORDER — SENNOSIDES-DOCUSATE SODIUM 8.6-50 MG PO TABS
2.0000 | ORAL_TABLET | Freq: Every day | ORAL | Status: DC
  Administered 2013-06-23 – 2013-06-24 (×2): 2 via ORAL
  Filled 2013-06-23 (×2): qty 2

## 2013-06-23 NOTE — Progress Notes (Signed)
Speech Language Pathology Daily Session Note  Patient Details  Name: Adam Callahan MRN: [id number] Date of Birth: 1961-04-20  Today's Date: 06/23/2013 Time: 4098-1191 Time Calculation (min): 45 min  Short Term Goals: Week 2: SLP Short Term Goal 1 (Week 2): Pt will demonstrate effective mastication of Dys 2 textures with Min cues to assess readiness for advancement. SLP Short Term Goal 2 (Week 2): Pt will perform oral motor exercises with Max cues SLP Short Term Goal 3 (Week 2): Pt will use external aids to demonstrate orientation x4 with Max cues SLP Short Term Goal 4 (Week 2): Pt will name common objects with 80% accuracy with Max cues SLP Short Term Goal 5 (Week 2): Pt will identify at least 1 cognitive and 1 physical impairment with Max cues  Skilled Therapeutic Interventions: Treatment focused on cognitive-linguistic and dysphagia goals. SLP facilitated session with skilled observation of minimal PO intake during lunch meal consisting of Dys 2 textures and thin liquids. Pt required Mod cues for utilization of safe swallowing strategies; no overt s/s of aspiration were observed. Pt was not wanting to eat and appeared to not be feeling well, not participating and stating that he "had to go." With Mod-Max question cues, pt was able to communicate via verbal language and gestures that he wanted to return to bed because his head was still hurting. RN made aware. SLP offered encouragement to participate, however pt became increasingl frustrated. Pt was returned to bed and repositioned with Mod cues to follow basic commands. RN administered medication, liquid and crushed in puree, with no overt s/s of aspiration and Mod cues for rate of consumption. Continue plan of care.   FIM:  Comprehension Comprehension Mode: Auditory Comprehension: 3-Understands basic 50 - 74% of the time/requires cueing 25 - 50%  of the time Expression Expression Mode: Verbal Expression: 3-Expresses basic 50 - 74% of  the time/requires cueing 25 - 50% of the time. Needs to repeat parts of sentences. Social Interaction Social Interaction: 2-Interacts appropriately 25 - 49% of time - Needs frequent redirection. Problem Solving Problem Solving: 2-Solves basic 25 - 49% of the time - needs direction more than half the time to initiate, plan or complete simple activities Memory Memory: 2-Recognizes or recalls 25 - 49% of the time/requires cueing 51 - 75% of the time FIM - Eating Eating Activity: 5: Needs verbal cues/supervision  Pain Pain Assessment Pain Assessment: 0-10 Pain Score: 5  Pain Type: Acute pain Pain Location: Head Pain Orientation: Posterior Pain Radiating Towards: neck  Pain Descriptors / Indicators: Headache Pain Frequency: Occasional Pain Onset: Gradual Patients Stated Pain Goal: 2 Pain Intervention(s): Medication (See eMAR);Repositioned (ultram 50 mg po)  Therapy/Group: Individual Therapy   [name], M.A. CCC-SLP [phone]   [name] 06/23/2013, 1:22 PM

## 2013-06-23 NOTE — Progress Notes (Signed)
Physical Therapy Note  Patient Details  Name: Adam Callahan MRN: [id number] Date of Birth: Aug 03, 1960 Today's Date: 06/23/2013  Time: 855-950 55 minutes  1:1 No c/o pain.  Gait training at railing in hallway.  Pt initially impulsive, moving L LE before R LE in place.  Requires max cuing to slow down for proper technique.  Assist for R LE placement and knee control, manual facilitation for anterior wt shift over R LE, cuing for increasing step length on L.  Pt improved with slowing down with cuing for each step of gait pattern.  On second attempt pt able to increase fluidity of gait with decreased cuing.  Stair negotiation x 3 steps with +2 assist for safety, pt still with impulsive stepping, moves L LE before R LE in place.  NMR standing for reaching and increasing wt bearing on R LE with manual facilitation and tactile cues at R quad and hips, max A for standing reaching task.  Supine bridging and PNF with R LE, little mm contraction felt in quad.  W/c mobility in controlled environment on tile and carpet with supervision, frequent rests due to fatigue at end of session.   [name] 06/23/2013, 9:54 AM

## 2013-06-23 NOTE — Patient Care Conference (Signed)
Inpatient RehabilitationTeam Conference and Plan of Care Update Date: 06/23/2013   Time: 12:00 Pm    Patient Name: Adam Callahan      Medical Record Number: [id number]  Date of Birth: 1961-04-21 Sex: Male         Room/Bed: 4W05C/4W05C-01 Payor Info: Payor: BLUE Merchant navy officer / Plan: [employer] PPO / Product Type: *No Product type* /    Admitting Diagnosis: CVA  Admit Date/Time:  06/09/2013  2:35 PM Admission Comments: No comment available   Primary Diagnosis:  Embolic cerebral infarction Principal Problem: Embolic cerebral infarction  Patient Active Problem List   Diagnosis Date Noted  . 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 Dehydration 06/07/2013  . Hypokalemia 06/07/2013  . Dyslipidemia 06/07/2013  . CVA (cerebral infarction) 06/06/2013  . Diabetes mellitus type 2, uncontrolled 06/06/2013  . Rhabdomyolysis 06/06/2013    Expected Discharge Date: Expected Discharge Date: 06/29/13  Team Members Present: Physician leading conference: Dr. Claudette Laws Social Worker Present: Staci Acosta, LCSW;Becky Mirinda Monte, LCSW Nurse Present: Carmie End, RN PT Present: Wanda Plump, PT OT Present: Bretta Bang, Carollee Sires, OT SLP Present: Fae Pippin, SLP PPS Coordinator present : Edson Snowball, Chapman Fitch, RN, CRRN     Current Status/Progress Goal Weekly Team Focus  Medical   no issues at night, constipated   maintain safety  regulate bowels, family training prior to  D/C   Bowel/Bladder   Intermittent continent and incontinent of bowel and bladder. Timed toilet during day, condom cath HS. LBM 11/30, sorbitol given 12/2 AM with no results.  Managed bowel and bladder   Timed toileting q 2-3 hrs   Swallow/Nutrition/ Hydration   Dys 2 textures and thin liquids without straws, full supervision  least restrictive PO intake  diet tolerance with advanced diet, use of strategies   ADL's   mod assist dressing, min-mod assist bathing, min assist functional  transfers, max assist toilet task, improved attention to R however decreased awareness of RUE/RLE during transfers  min-mod assist overall   R NMR, functional transfers, hemi dressing, attention to R, activity tolerance, strengthening, pt/family education   Mobility   min A transfers, max A gait, supervision w/c  supervision w/c, min A transfers  family ed   Communication   spontaneous speech intermittently at the phrase level, Max cues for confrontational language tasks, uses head nods  Min cues (may need to be downgraded)  repetition, receptive identification, increase verbal communication   Safety/Cognition/ Behavioral Observations  decreased intellectual awareness, orientation, sustained attention  Min (may need to be downgraded)  intellectual awareness, orientation, sustained attention, following two-step commands   Pain   c/o headache, Vicodin 325mg  given q 6hrs PRN, Asper creme given BID for neck discomfort  <3  Offer pain medication 1 hour prior to therapy   Skin   Wound to Rt. knee with Tegaderm in place  No new additonal skin breakdown  Assess skin q shift for additional breakdown      *See Care Plan and progress notes for long and short-term goals.  Barriers to Discharge: Bowel and bladder inc    Possible Resolutions to Barriers:  see above    Discharge Planning/Teaching Needs:  Home with aunt and her god daugther assisting-someone is here daily      Team Discussion:  Need to begin family education.  Sleeping better at night.  More spontaneous speech.  RN working on continence  Revisions to Treatment Plan:  None   Continued Need for Acute Rehabilitation Level of  Care: The patient requires daily medical management by a physician with specialized training in physical medicine and rehabilitation for the following conditions: Daily direction of a multidisciplinary physical rehabilitation program to ensure safe treatment while eliciting the highest outcome that is of practical  value to the patient.: Yes Daily medical management of patient stability for increased activity during participation in an intensive rehabilitation regime.: Yes Daily analysis of laboratory values and/or radiology reports with any subsequent need for medication adjustment of medical intervention for : Neurological problems;Other  Lucy Chris 06/24/2013, 12:18 PM

## 2013-06-23 NOTE — Progress Notes (Signed)
Occupational Therapy Session Note  Patient Details  Name: Adam Callahan MRN: [id number] Date of Birth: 1961-06-02  Today's Date: 06/23/2013 Time: 0730-0830 and [id number]  Time Calculation (min): 60 min and 25 min   Short Term Goals: Week 2:  OT Short Term Goal 1 (Week 2): Pt will complete LB dressing with mod assist consistently  OT Short Term Goal 2 (Week 2): Pt will complete bathing with min assist and no more then 1 verbal cue OT Short Term Goal 3 (Week 2): Pt will complete UB dressing with min assist and no cues for hemi dressing technique OT Short Term Goal 4 (Week 2): Pt will compete functional transfers with min verbal cues for awareness of RLE/RUE  Skilled Therapeutic Interventions/Progress Updates:    Session 1: Pt seen for ADL retraining with focus on attention to R, functional transfers, postural control in standing, and hemi dressing technique. Pt declining bathing this AM. Pt not oriented to time and aware he was in a hospital with increased time however unable to determine name of hospital. Completed dressing while sitting in w/c. Pt with recall of hemi dressing technique for donning shirt and threaded LUE requiring assist to keep sleeve past elbow and min assist to bring overhead. Allowed pt to begin LB dressing without cues and pt having difficulty as he was threading BLE into same pant leg. Cued for crossover technique and assist to keep LLE crossed while he threaded LEs. Pt scanning to R side throughout session to locate clothing and grooming items without cues. Discussed beginning family education with aunt's goddaughter (caregiver) and she was agreeable to begin this week however requesting to wait a couple days to see how much more patient progresses before beginning hands on training. Caregiver watched and asked appropriate question during toilet transfers x2 with pt requiring min assist and cues for positioning of BLE. Discussed bathroom setup at home and DME recommended.  Patient and caregiver agreeable to recommendations. Will work with caregiver and SW to plan more family education.   Session 2: Pt refusing therapist 2x prior to this time as pt was scheduled for therapy at 1330. Pt willing to complete therapy in room. Engaged in NMR to RUE while sitting EOB and weightbearing through RUE during reaching task. Placed 3 colored pegs and cued to match colors in line while reaching to place to facilitate weightbearing through RUE. Pt required max cues to correctly complete task and required to downgrade to matching 2 colors. Pt demonstrating active shoulder adduction on this date. At end of session pt returned to supine in bed and proper positioning of RUE.  Discussed with aunt and aunt's goddaughter (caregivers) pt's CLOF and expected assist that will be required at home. Discussed family education and scheduled for this weekend with aunt and during remainder of week with goddaughter. 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 After further education and discussion they agreed to participate in hands-on training.   Therapy Documentation Precautions:  Precautions Precautions: Fall Precaution Comments: right inattention Restrictions Weight Bearing Restrictions: No General:   Vital Signs: Therapy Vitals Temp: 97.8 F (36.6 C) Temp src: Oral Pulse Rate: 89 Resp: 18 BP: 135/85 mmHg Patient Position, if appropriate: Lying Oxygen Therapy SpO2: 96 % O2 Device: None (Room air) Pain:  Session 1: No report of pain  Session 2: Pt with report of headache however declining meds  ADL:   Exercises:   Other Treatments:  See FIM for current functional status  Therapy/Group: Individual Therapy  Daneil Dan 06/23/2013, 8:52 AM

## 2013-06-23 NOTE — Progress Notes (Signed)
Pt's aunt arrived at 67 to stay with him tonight in lieu of sitter. At 2200, she stopped at the desk and stated she was going home and would be back early in the morning. When asked if she had made arrangements to stay all night with pt, she stated he said he is going to sleep tonight. Will monitor closely. Mick Sell RN

## 2013-06-23 NOTE — Plan of Care (Signed)
Problem: RH BOWEL ELIMINATION Goal: RH STG MANAGE BOWEL W/MEDICATION W/ASSISTANCE STG Manage Bowel with Medication with min Assistance.  Outcome: Not Progressing Sorbitol given and no bm noted .   Problem: RH SKIN INTEGRITY Goal: RH STG MAINTAIN SKIN INTEGRITY WITH ASSISTANCE STG Maintain Skin Integrity With min Assistance.  Outcome: Not Progressing Requires mod assist to turn and maintain skin integrity . tegaderm applied to right knee abrasion . Patient continues "pick " at right knee wound

## 2013-06-23 NOTE — Progress Notes (Signed)
Social Work [name], male   DOB: 08-03-1960, 52 y.o.   MRN: [id number] Met with pt, Aunt and god daughter to inform team conference progression toward goals and still discharge 12/9.  Discussed having them begin family education. They plan to begin tomorrow and this weekend.  Aunt to stay with him tonight, the sitter is agitated him and she will stay with him tonight.  Then she feels it will be ok. Discussed DME and follow up she wants Home Health to start off with then transition to OP.  Will see how education goes.

## 2013-06-23 NOTE — Progress Notes (Signed)
52 y.o. right-handed male with history of diabetes mellitus with peripheral neuropathy. Patient was independent prior to admission driving . Admitted 06/06/2013 with acute right-sided weakness and aphasia after being found on the floor by his family. MRI of the brain acute infarct left PCA territory. There is acute infarct involving the left mid and posterior temporal lobe extending into the left occipital lobe as well acute infarct present left thalamus and left midbrain, left inferior cerebellum. MRA of the head negative for aneurysm. MRI of the neck with occlusions versus embolus versus dissection of the left posterior cerebral artery and left posterior inferior cerebellar artery compatible with acute infarct. Echocardiogram with ejection fraction of 55% no wall motion abnormalities and grade 1 diastolic dysfunction. Carotid Dopplers with no ICA stenosis. Patient did not receive TPA. Neurology services consulted presently on aspirin and Plavix for CVA prophylaxis as well as subcutaneous Lovenox for DVT prophylaxis. Presently maintained on a dysphagia 1 thin liquid diet. Hemoglobin A1c 10.9 Subjective/Complaints: Aphasic and dysarthric.  Emotional lability improved Poor awareness of deficits No neck pain Reviewed neuropsychology evaluation Discussed basic results of neuropsychology evaluation with patient and family member. Patient with severe aphasia as well as cognitive deficits below dictating comprehension expression and memory Review of systems: Cannot obtain secondary to mental status  Objective: Vital Signs: Blood pressure 135/85, pulse 89, temperature 97.8 F (36.6 C), temperature source Oral, resp. rate 18, height 5\' 10"  (1.778 m), weight 76.204 kg (168 lb), SpO2 96.00%. No results found. Results for orders placed during the hospital encounter of 06/09/13 (from the past 72 hour(s))  GLUCOSE, CAPILLARY     Status: Abnormal   Collection Time    06/20/13 12:08 PM      Result Value Range   Glucose-Capillary 144 (*) 70 - 99 mg/dL  GLUCOSE, CAPILLARY     Status: Abnormal   Collection Time    06/20/13  4:23 PM      Result Value Range   Glucose-Capillary 134 (*) 70 - 99 mg/dL  GLUCOSE, CAPILLARY     Status: Abnormal   Collection Time    06/20/13  8:35 PM      Result Value Range   Glucose-Capillary 114 (*) 70 - 99 mg/dL  GLUCOSE, CAPILLARY     Status: Abnormal   Collection Time    06/21/13  7:33 AM      Result Value Range   Glucose-Capillary 155 (*) 70 - 99 mg/dL  GLUCOSE, CAPILLARY     Status: Abnormal   Collection Time    06/21/13 12:00 PM      Result Value Range   Glucose-Capillary 200 (*) 70 - 99 mg/dL  GLUCOSE, CAPILLARY     Status: Abnormal   Collection Time    06/21/13  4:14 PM      Result Value Range   Glucose-Capillary 147 (*) 70 - 99 mg/dL  GLUCOSE, CAPILLARY     Status: Abnormal   Collection Time    06/21/13  8:23 PM      Result Value Range   Glucose-Capillary 141 (*) 70 - 99 mg/dL  GLUCOSE, CAPILLARY     Status: Abnormal   Collection Time    06/22/13  7:35 AM      Result Value Range   Glucose-Capillary 146 (*) 70 - 99 mg/dL  GLUCOSE, CAPILLARY     Status: Abnormal   Collection Time    06/22/13 11:36 AM      Result Value Range   Glucose-Capillary 260 (*) 70 - 99 mg/dL  GLUCOSE, CAPILLARY     Status: None   Collection Time    06/22/13  4:51 PM      Result Value Range   Glucose-Capillary 97  70 - 99 mg/dL  GLUCOSE, CAPILLARY     Status: Abnormal   Collection Time    06/22/13  8:37 PM      Result Value Range   Glucose-Capillary 211 (*) 70 - 99 mg/dL   Comment 1 Notify RN    CREATININE, SERUM     Status: None   Collection Time    06/23/13  7:04 AM      Result Value Range   Creatinine, Ser 0.69  0.50 - 1.35 mg/dL   GFR calc non Af Amer >90  >90 mL/min   GFR calc Af Amer >90  >90 mL/min   Comment: (NOTE)     The eGFR has been calculated using the CKD EPI equation.     This calculation has not been validated in all clinical situations.      eGFR's persistently <90 mL/min signify possible Chronic Kidney     Disease.  GLUCOSE, CAPILLARY     Status: Abnormal   Collection Time    06/23/13  7:18 AM      Result Value Range   Glucose-Capillary 158 (*) 70 - 99 mg/dL     HEENT: normal Cardio: RRR and no murmur Resp: CTA B/L and unlabored GI: BS positive and NT, non distended Extremity:  Pulses positive and No Edema Skin:   Intact Neuro: Lethargic, Flat and Cranial Nerve Abnormalities decreased accomodation, R o/5 Delt bi,trace tri, 0/5grip  3 minus R HF, 1/5 KE , ADF trace, Left side 4+/5,  Reduced sensation in RUE and RLE.  Speech with reduced output, strained quality, difficulty following commands Musc/Skel:  Normal, neck mild tenderness on left Gen NAD Mood and affect are labile  Assessment/Plan: 1. 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 Physiatrist is providing close team supervision and 24 hour management of active medical problems listed below. Physiatrist and rehab team continue to assess barriers to discharge/monitor patient progress toward functional and medical goals. Team conference today please see physician documentation under team conference tab, met with team face-to-face to discuss problems,progress, and goals. Formulized individual treatment plan based on medical history, underlying problem and comorbidities. FIM: FIM - Bathing Bathing Steps Patient Completed: Chest;Right Arm;Abdomen;Front perineal area;Right upper leg;Left upper leg;Left lower leg (including foot) Bathing: 3: Mod-Patient completes 5-7 44f 10 parts or 50-74%  FIM - Upper Body Dressing/Undressing Upper body dressing/undressing steps patient completed: Thread/unthread left sleeve of pullover shirt/dress;Pull shirt over trunk Upper body dressing/undressing: 3:  Mod-Patient completed 50-74% of tasks FIM - Lower Body Dressing/Undressing Lower body dressing/undressing steps patient completed: Thread/unthread left pants leg;Don/Doff left sock;Thread/unthread right pants leg;Don/Doff left shoe Lower body dressing/undressing: 3: Mod-Patient completed 50-74% of tasks  FIM - Toileting Toileting steps completed by patient: Adjust clothing prior to toileting Toileting: 2: Max-Patient completed 1 of 3 steps  FIM - Diplomatic Services operational officer Devices: Grab bars Toilet Transfers: 4-To toilet/BSC: Min A (steadying Pt. > 75%);4-From toilet/BSC: Min A (steadying Pt. > 75%)  FIM - Bed/Chair Transfer Bed/Chair Transfer Assistive Devices: Bed rails;Arm rests Bed/Chair Transfer: 4: Supine > Sit: Min A (steadying Pt. > 75%/lift 1 leg);4: Bed > Chair or W/C: Min A (steadying Pt. > 75%)  FIM - Locomotion: Wheelchair Distance: 150 Locomotion: Wheelchair: 5: Travels 150 ft or more: maneuvers on rugs and over door sills with supervision, cueing or coaxing FIM - Locomotion: Ambulation Locomotion: Ambulation Assistive Devices: Other (comment) (L hallway hand rail) Ambulation/Gait Assistance: 1: +2 Total assist Locomotion: Ambulation: 1: Two helpers  Comprehension Comprehension Mode: Auditory Comprehension: 4-Understands basic 75 - 89% of the time/requires cueing 10 - 24% of the time  Expression Expression Mode: Verbal Expression Assistive Devices: 6-Other (Comment) (dysarthic and aphaisic) Expression: 3-Expresses basic 50 - 74% of the time/requires cueing 25 - 50% of the time. Needs to repeat parts of sentences.  Social Interaction Social Interaction: 4-Interacts appropriately 75 - 89% of the time - Needs redirection for appropriate language or to initiate interaction.  Problem Solving Problem Solving: 2-Solves basic 25 - 49% of the time - needs direction more than half the time to initiate, plan or complete simple activities  Memory Memory:  2-Recognizes or recalls 25 - 49% of the time/requires cueing 51 - 75% of the time Medical Problem List and Plan:  1. Embolic left PCA infarct, right hemiparesis, right hemisensory deficits, right homonymous hemianopsia  2. DVT Prophylaxis/Anticoagulation: Subcutaneous Lovenox. Monitor platelet counts and any signs of bleeding  3. Pain Management: Tylenol as needed. Left neck and occipital pain improved with low dose hydrocodone will D/C, not long term treatment will try kpad and sportscreme 4. Neuropsych: This patient is not capable of making decisions on his own behalf.  5. Dysphagia/aphasia. Dysphagia 1 and liquids. Monitor for any signs of aspiration. Followup speech therapy.  6. Diabetes mellitus with peripheral neuropathy, uncontrolled. Hemoglobin A1c 10.9. Lantus insulin titrate as per am CBG  Check CBGs a.c. and at bedtime,SSI. Schedule meal coverage Provide diabetic teaching.  7. Hyperlipidemia. Lipitor  8. Emotional lability after stroke improving on sertraline  LOS (Days) 14 A FACE TO FACE EVALUATION WAS PERFORMED  Alexza Norbeck E 06/23/2013, 9:20 AM

## 2013-06-23 NOTE — Progress Notes (Signed)
NUTRITION FOLLOW UP  Intervention:   Continue Glucerna Shake po daily, each supplement provides 220 kcal and 10 grams of protein. Continue MVI. RD provided Carbohydrate Modified diet education with patient's family. RD to continue to follow nutrition care plan.  Nutrition Dx:   Increased nutrient needs related to participation in therapies as evidenced by estimated needs - ongoing.   Goal:  Intake to meet >90% of estimated nutrition needs - met.  Monitor:  weight trends, lab trends, I/O's, PO intake, supplement tolerance   Assessment:   PMHx significant for DM (A1c 10.9%) with peripheral neuropathy. Admitted 11/16 with acute infarct of left PCA territory. Currently maintained on a Dysphagia 1 diet with thin liquids.  RD completed diet education with family. Family reports that they have several family members and friends that have diabetes so they are familiar with the recommendations that are needed.  Pt upgraded to Dysphagia 2 diet with thin liquids. Per doc flowsheets, intake is variable, 0-100%. Currently ordered for Glucerna Shake daily.  Expected d/c date for 12/9.  Height: Ht Readings from Last 1 Encounters:  06/09/13 5\' 10"  (1.778 m)    Weight Status:   Wt Readings from Last 1 Encounters:  06/16/13 168 lb (76.204 kg)  Admission weight: 181 lb (82.5 kg)  Re-estimated needs:  Kcal: 1750 - 2000  Protein: 80 - 95 g  Fluid: 1.7 - 2 liters daily  Skin: knee abrasion  Diet Order: Dysphagia 2 with Thin Liquids Meal Completion: 0-100%   Intake/Output Summary (Last 24 hours) at 06/23/13 1454 Last data filed at 06/23/13 1340  Gross per 24 hour  Intake    700 ml  Output    500 ml  Net    200 ml    Last BM: 11/30   Labs:   Recent Labs Lab 06/23/13 0704  CREATININE 0.69    CBG (last 3)   Recent Labs  06/22/13 2037 06/23/13 0718 06/23/13 1132  GLUCAP 211* 158* 151*   Lab Results  Component Value Date   HGBA1C 10.9* 06/07/2013   Scheduled  Meds: . atorvastatin  20 mg Oral q1800  . clopidogrel  75 mg Oral Q breakfast  . enoxaparin (LOVENOX) injection  40 mg Subcutaneous Daily  . feeding supplement (GLUCERNA SHAKE)  237 mL Oral Daily  . insulin aspart  0-15 Units Subcutaneous TID WC  . insulin aspart  6 Units Subcutaneous TID WC  . insulin glargine  25 Units Subcutaneous Daily  . multivitamin with minerals  1 tablet Oral Daily  . senna-docusate  2 tablet Oral QHS  . sertraline  50 mg Oral Daily  . trolamine salicylate   Topical BID AC & HS    Continuous Infusions:  none  Jarold Motto MS, RD, LDN Pager: [phone] After-hours pager: [phone]

## 2013-06-24 ENCOUNTER — Inpatient Hospital Stay (HOSPITAL_COMMUNITY): Payer: BC Managed Care – PPO | Admitting: Physical Therapy

## 2013-06-24 ENCOUNTER — Inpatient Hospital Stay (HOSPITAL_COMMUNITY): Payer: Self-pay

## 2013-06-24 ENCOUNTER — Inpatient Hospital Stay (HOSPITAL_COMMUNITY): Payer: BC Managed Care – PPO

## 2013-06-24 DIAGNOSIS — G8110 Spastic hemiplegia affecting unspecified side: Secondary | ICD-10-CM

## 2013-06-24 DIAGNOSIS — I69921 Dysphasia following unspecified cerebrovascular disease: Secondary | ICD-10-CM

## 2013-06-24 DIAGNOSIS — I6330 Cerebral infarction due to thrombosis of unspecified cerebral artery: Secondary | ICD-10-CM

## 2013-06-24 LAB — GLUCOSE, CAPILLARY
Glucose-Capillary: 146 mg/dL — ABNORMAL HIGH (ref 70–99)
Glucose-Capillary: 139 mg/dL — ABNORMAL HIGH (ref 70–99)

## 2013-06-24 NOTE — Progress Notes (Signed)
Physical Therapy Note  Patient Details  Name: MURPHY DUZAN MRN: [id number] Date of Birth: 11/30/1960 Today's Date: 06/24/2013  Time: [phone] 57 minutes  1:1 no c/o pain. Treatment focused on pt/caregiver ed.  Pt/caregiver safely demo'd at min A level transfers w/c <> bed, bed mobility, car transfers, furniture transfers.  Pt's caregiver able to safely set up w/c in safe position for transfer and perform parts management for w/c.  Caregiver given handout of stair negotiation with w/c, states she has assisted person in w/c up the stairs before and feels comfortable.  Pt able to demo supervision level for w/c mobility in home and controlled environments.  Family ed to continue with other caregivers later this week.   DONAWERTH,KAREN 06/24/2013, 10:28 AM

## 2013-06-24 NOTE — Progress Notes (Signed)
Placed patient on auto titrate mode 20 max 4 min with nasal mask.  Patient is tolerating well.

## 2013-06-24 NOTE — Progress Notes (Signed)
Speech Language Pathology Weekly Progress & Daily Session Notes  Patient Details  Name: Adam Callahan MRN: [id number] Date of Birth: 06-20-1961  Today's Date: 06/24/2013 Time: 0830-0900 Time Calculation (min): 30 min  Short Term Goals: Week 2: SLP Short Term Goal 1 (Week 2): Pt will demonstrate effective mastication of Dys 2 textures with Min cues to assess readiness for advancement. SLP Short Term Goal 1 - Progress (Week 2): Met SLP Short Term Goal 2 (Week 2): Pt will perform oral motor exercises with Max cues SLP Short Term Goal 2 - Progress (Week 2): Met SLP Short Term Goal 3 (Week 2): Pt will use external aids to demonstrate orientation x4 with Max cues SLP Short Term Goal 3 - Progress (Week 2): Not met SLP Short Term Goal 4 (Week 2): Pt will name common objects with 80% accuracy with Max cues SLP Short Term Goal 4 - Progress (Week 2): Met SLP Short Term Goal 5 (Week 2): Pt will identify at least 1 cognitive and 1 physical impairment with Max cues SLP Short Term Goal 5 - Progress (Week 2): Not met  New Short Term Goals: Week 3: SLP Short Term Goal 1 (Week 3): Pt will utilize swallowing strategies with Dys 2 textures and thin liquids to minimize aspiration risk with Min cues SLP Short Term Goal 2 (Week 3): Pt will use external aids to demonstrate orientation x4 with Max cues SLP Short Term Goal 3 (Week 3): Pt will use speech intelligibility strategies at the word level during automatic speech tasks with Min cues SLP Short Term Goal 4 (Week 3): Pt will name common objects with 80% accuracy with Mod cues SLP Short Term Goal 5 (Week 3): Pt will identify at least 1 cognitive and 1 physical impairment with Max cues  Weekly Progress Updates: Pt has met 3 out of 5 goals during this reporting period, demonstrating gains in speech/language and swallowing function. Pt was advanced to Dys 2 textures and thin liquids, which he is consuming with Mod cues for utilization of strategies. He is  completing automatic speech tasks with Min cues, and Mod cues for speech intelligibility strategies. He requires Max cues for confrontational naming, and is intermittently producing spontaneous speech at the word to short phrase level. He is communicating his wants/needs via multimodal communication with Mod A. Education with pt and caregivers is ongoing. Pt will benefit from continued SLP services to maximize swallowing safety, functional communication, and cognitive functioning to reduce burden of care prior to discharge home.   SLP Intensity: Minumum of 1-2 x/day, 30 to 90 minutes SLP Frequency: 5 out of 7 days SLP Duration/Estimated Length of Stay: 18-20 days SLP Treatment/Interventions: Cognitive remediation/compensation;Cueing hierarchy;Dysphagia/aspiration precaution training;Environmental controls;Functional tasks;Internal/external aids;Oral motor exercises;Patient/family education;Speech/Language facilitation;Therapeutic Activities;Therapeutic Exercise  Daily Session Skilled Therapeutic Intervention: Treatment focused on cognitive-linguistic and swallowing goals. SLP facilitated session with Min-Mod cues for accuracy with automatic speech tasks, as pt appeared mildly lethargic this morning. He utilized his speech intelligibility strategies with Mod cues for a slower rate and loud, clear speech. Pt's friend and caregiver arrived for part of session, and education was provided regarding recent diet advancement and cognitive-linguistic abilities. She verbalized her understanding. Pt consumed medication crushed in puree administered by RN with supervision and no overt s/s of aspiration.  FIM:  Comprehension Comprehension Mode: Auditory Comprehension: 3-Understands basic 50 - 74% of the time/requires cueing 25 - 50%  of the time Expression Expression Mode: Verbal Expression: 3-Expresses basic 50 - 74% of the time/requires cueing 25 -  50% of the time. Needs to repeat parts of sentences. Social  Interaction Social Interaction: 2-Interacts appropriately 25 - 49% of time - Needs frequent redirection. Problem Solving Problem Solving: 2-Solves basic 25 - 49% of the time - needs direction more than half the time to initiate, plan or complete simple activities Memory Memory: 2-Recognizes or recalls 25 - 49% of the time/requires cueing 51 - 75% of the time FIM - Eating Eating Activity: 5: Supervision/cues General    Pain Pain Assessment Pain Assessment: No/denies pain Pain Score: 0-No pain PAINAD (Pain Assessment in Advanced Dementia) Breathing: normal Negative Vocalization: none Facial Expression: smiling or inexpressive Body Language: relaxed Consolability: no need to console PAINAD Score: 0  Therapy/Group: Individual Therapy   [name], M.A. CCC-SLP [phone]   [name] 06/24/2013, 12:29 PM

## 2013-06-24 NOTE — Progress Notes (Signed)
Patient put cpap on himself and is currently wearing cpap nasal mask. Patient is on room air. Patient is tolerating cpap well at this time.

## 2013-06-24 NOTE — Progress Notes (Signed)
52 y.o. right-handed male with history of diabetes mellitus with peripheral neuropathy. Patient was independent prior to admission driving . Admitted 06/06/2013 with acute right-sided weakness and aphasia after being found on the floor by his family. MRI of the brain acute infarct left PCA territory. There is acute infarct involving the left mid and posterior temporal lobe extending into the left occipital lobe as well acute infarct present left thalamus and left midbrain, left inferior cerebellum. MRA of the head negative for aneurysm. MRI of the neck with occlusions versus embolus versus dissection of the left posterior cerebral artery and left posterior inferior cerebellar artery compatible with acute infarct. Echocardiogram with ejection fraction of 55% no wall motion abnormalities and grade 1 diastolic dysfunction. Carotid Dopplers with no ICA stenosis. Patient did not receive TPA. Neurology services consulted presently on aspirin and Plavix for CVA prophylaxis as well as subcutaneous Lovenox for DVT prophylaxis. Presently maintained on a dysphagia 1 thin liquid diet. Hemoglobin A1c 10.9 Subjective/Complaints: Aphasic and dysarthric.  "I want it hot"  Motioning to thermostat Cannot do ROS secondary to aphasia Reviewed neuropsychology evaluation   Objective: Vital Signs: Blood pressure 146/93, pulse 83, temperature 97.6 F (36.4 C), temperature source Oral, resp. rate 18, height 5\' 10"  (1.778 m), weight 76.658 kg (169 lb), SpO2 99.00%. No results found. Results for orders placed during the hospital encounter of 06/09/13 (from the past 72 hour(s))  GLUCOSE, CAPILLARY     Status: Abnormal   Collection Time    06/21/13  7:33 AM      Result Value Range   Glucose-Capillary 155 (*) 70 - 99 mg/dL  GLUCOSE, CAPILLARY     Status: Abnormal   Collection Time    06/21/13 12:00 PM      Result Value Range   Glucose-Capillary 200 (*) 70 - 99 mg/dL  GLUCOSE, CAPILLARY     Status: Abnormal   Collection  Time    06/21/13  4:14 PM      Result Value Range   Glucose-Capillary 147 (*) 70 - 99 mg/dL  GLUCOSE, CAPILLARY     Status: Abnormal   Collection Time    06/21/13  8:23 PM      Result Value Range   Glucose-Capillary 141 (*) 70 - 99 mg/dL  GLUCOSE, CAPILLARY     Status: Abnormal   Collection Time    06/22/13  7:35 AM      Result Value Range   Glucose-Capillary 146 (*) 70 - 99 mg/dL  GLUCOSE, CAPILLARY     Status: Abnormal   Collection Time    06/22/13 11:36 AM      Result Value Range   Glucose-Capillary 260 (*) 70 - 99 mg/dL  GLUCOSE, CAPILLARY     Status: None   Collection Time    06/22/13  4:51 PM      Result Value Range   Glucose-Capillary 97  70 - 99 mg/dL  GLUCOSE, CAPILLARY     Status: Abnormal   Collection Time    06/22/13  8:37 PM      Result Value Range   Glucose-Capillary 211 (*) 70 - 99 mg/dL   Comment 1 Notify RN    CREATININE, SERUM     Status: None   Collection Time    06/23/13  7:04 AM      Result Value Range   Creatinine, Ser 0.69  0.50 - 1.35 mg/dL   GFR calc non Af Amer >90  >90 mL/min   GFR calc Af Amer >90  >  90 mL/min   Comment: (NOTE)     The eGFR has been calculated using the CKD EPI equation.     This calculation has not been validated in all clinical situations.     eGFR's persistently <90 mL/min signify possible Chronic Kidney     Disease.  GLUCOSE, CAPILLARY     Status: Abnormal   Collection Time    06/23/13  7:18 AM      Result Value Range   Glucose-Capillary 158 (*) 70 - 99 mg/dL  GLUCOSE, CAPILLARY     Status: Abnormal   Collection Time    06/23/13 11:32 AM      Result Value Range   Glucose-Capillary 151 (*) 70 - 99 mg/dL  GLUCOSE, CAPILLARY     Status: None   Collection Time    06/23/13  5:06 PM      Result Value Range   Glucose-Capillary 91  70 - 99 mg/dL  GLUCOSE, CAPILLARY     Status: Abnormal   Collection Time    06/23/13  8:45 PM      Result Value Range   Glucose-Capillary 152 (*) 70 - 99 mg/dL   Comment 1 Notify RN        HEENT: normal Cardio: RRR and no murmur Resp: CTA B/L and unlabored GI: BS positive and NT, non distended Extremity:  Pulses positive and No Edema Skin:   Intact Neuro: Lethargic, Flat and Cranial Nerve Abnormalities decreased accomodation, R o/5 Delt bi,trace tri, 0/5grip  3 minus R HF, 1/5 KE , ADF trace, Left side 4+/5,  Reduced sensation in RUE and RLE.  Speech with reduced output, strained quality, difficulty following commands Musc/Skel:  Normal, neck mild tenderness on left Gen NAD Mood and affect are labile  Assessment/Plan: 1. 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 Physiatrist is providing close team supervision and 24 hour management of active medical problems listed below. Physiatrist and rehab team continue to assess barriers to discharge/monitor patient progress toward functional and medical goals.  FIM: FIM - Bathing Bathing Steps Patient Completed: Chest;Right Arm;Abdomen;Front perineal area;Right upper leg;Left upper leg;Left lower leg (including foot) Bathing: 3: Mod-Patient completes 5-7 50f 10 parts or 50-74%  FIM - Upper Body Dressing/Undressing Upper body dressing/undressing steps patient completed: Thread/unthread left sleeve of pullover shirt/dress;Pull shirt over trunk Upper body dressing/undressing: 3: Mod-Patient completed 50-74% of tasks FIM - Lower Body Dressing/Undressing Lower body dressing/undressing steps patient completed: Thread/unthread left pants leg;Don/Doff left sock;Thread/unthread right pants leg;Don/Doff left shoe Lower body dressing/undressing: 3: Mod-Patient completed 50-74% of tasks  FIM - Toileting Toileting steps completed by patient: Adjust clothing prior to toileting Toileting: 2: Max-Patient completed 1 of 3 steps  FIM - Quarry manager Devices: Grab bars Toilet Transfers: 4-To toilet/BSC: Min A (steadying Pt. > 75%);4-From toilet/BSC: Min A (steadying Pt. > 75%)  FIM - Bed/Chair Transfer Bed/Chair Transfer Assistive Devices: Bed rails;Arm rests Bed/Chair Transfer: 4: Supine > Sit: Min A (steadying Pt. > 75%/lift 1 leg);4: Sit > Supine: Min A (steadying pt. > 75%/lift 1 leg);3: Bed > Chair or W/C: Mod A (lift or lower assist);3: Chair or W/C > Bed: Mod A (lift or lower assist)  FIM - Locomotion: Wheelchair Distance: 150 Locomotion: Wheelchair: 5: Travels 150 ft or more: maneuvers on rugs and over door sills with supervision, cueing or coaxing FIM -  Locomotion: Ambulation Locomotion: Ambulation Assistive Devices: Other (comment) (L hallway hand rail) Ambulation/Gait Assistance: 1: +2 Total assist Locomotion: Ambulation: 1: Travels less than 50 ft with maximal assistance (Pt: 25 - 49%)  Comprehension Comprehension Mode: Auditory Comprehension: 3-Understands basic 50 - 74% of the time/requires cueing 25 - 50%  of the time  Expression Expression Mode: Verbal Expression Assistive Devices: 6-Other (Comment) (dysarthic and aphaisic) Expression: 3-Expresses basic 50 - 74% of the time/requires cueing 25 - 50% of the time. Needs to repeat parts of sentences.  Social Interaction Social Interaction: 2-Interacts appropriately 25 - 49% of time - Needs frequent redirection.  Problem Solving Problem Solving: 2-Solves basic 25 - 49% of the time - needs direction more than half the time to initiate, plan or complete simple activities  Memory Memory: 2-Recognizes or recalls 25 - 49% of the time/requires cueing 51 - 75% of the time Medical Problem List and Plan:  1. Embolic left PCA infarct, right hemiparesis, right hemisensory deficits, right homonymous hemianopsia  2. DVT Prophylaxis/Anticoagulation: Subcutaneous Lovenox. Monitor platelet counts and any signs of bleeding  3. Pain Management: Tylenol as needed.  Left neck and occipital pain improved with low dose hydrocodone will D/C, not long term treatment will try kpad and sportscreme 4. Neuropsych: This patient is not capable of making decisions on his own behalf.  5. Dysphagia/aphasia. Dysphagia 1 and liquids. Monitor for any signs of aspiration. Followup speech therapy.  6. Diabetes mellitus with peripheral neuropathy, uncontrolled. Hemoglobin A1c 10.9. Lantus insulin titrate as per am CBG  Check CBGs a.c. and at bedtime,SSI. Schedule meal coverage Provide diabetic teaching.  7. Hyperlipidemia. Lipitor  8. Emotional lability after stroke improving on sertraline  LOS (Days) 15 A FACE TO FACE EVALUATION WAS PERFORMED  KIRSTEINS,ANDREW E 06/24/2013, 6:53 AM

## 2013-06-24 NOTE — Progress Notes (Signed)
Occupational Therapy Session Note  Patient Details  Name: Adam Callahan MRN: [id number] Date of Birth: 07-28-1960  Today's Date: 06/24/2013 Time: 1030-1130 and [id number] Time Calculation (min): 60 min and 15 min   Short Term Goals: Week 2:  OT Short Term Goal 1 (Week 2): Pt will complete LB dressing with mod assist consistently  OT Short Term Goal 2 (Week 2): Pt will complete bathing with min assist and no more then 1 verbal cue OT Short Term Goal 3 (Week 2): Pt will complete UB dressing with min assist and no cues for hemi dressing technique OT Short Term Goal 4 (Week 2): Pt will compete functional transfers with min verbal cues for awareness of RLE/RUE  Skilled Therapeutic Interventions/Progress Updates:    Session 1: Pt seen for ADL retraining with focus on functional transfers, awareness of RUE/RLE during self-care tasks, caregiver education. Pt received sitting in w/c with aunt's goddaughter present. Pt initially refusing therapy at this time and unable to determine reason secondary to aphasia. After encouragement from caregiver and therapist pt agreed to participate. Caregiver reported her mother would be assisting with bathing so did not assist with this task. Focus of bathing was positioning of RUE and HOH assist to wash 2 body parts. Pt with min carryover of positioning of RUE during task. Required min assist for standing balance in shower and manual facilitation for hip alignment. Discussed hemi dressing technique with caregiver as pt completed dressing from w/c level. Required assist for crossover with LLE during LB dressing and cues for postural control as pt leaning to R with dynamic activity. Caregiver assisted with LB dressing during sit<>stand with min assist and managing clothing around waist. At end of session caregiver transferred pt w/c>bed with min assist and providing appropriate cues for positioning of extremities and for pace of pt. Pt left supine in bed with HOB elevated  and all needs in reach. Will continue caregiver education this week/weekend.   Session 2: Pt seen this PM with focus on functional transfers. Pt refused scheduled therapy session at 1300 secondary to fatigue and became verbally aggressive when therapist provided encouragement to participate and environmental stimuli (increased lighting, HOB elevated) to increase arousal. On second attempt for therapy session at 1420 pt's god daughter present to provide encouragement. Pt became verbally aggressive but began supine>sit for participation requiring min assist. Pt reporting being tired and repeating "no" and "bed." Pt then initiated transfer to w/c via standing up with min guard assist. Pt then completed transfer with min assist. Pt then requesting to return to be immediately. Assisted with min assist and pt left supine in bed with all needs in reach. Discussed home setup with caregiver and family education to continue tomorrow.   Therapy Documentation Precautions:  Precautions Precautions: Fall Precaution Comments: right inattention Restrictions Weight Bearing Restrictions: No General:   Vital Signs:   Pain: No report of pain during therapy session.   See FIM for current functional status  Therapy/Group: Individual Therapy  Daneil Dan 06/24/2013, 12:07 PM

## 2013-06-25 ENCOUNTER — Inpatient Hospital Stay (HOSPITAL_COMMUNITY): Payer: BC Managed Care – PPO

## 2013-06-25 DIAGNOSIS — I69921 Dysphasia following unspecified cerebrovascular disease: Secondary | ICD-10-CM

## 2013-06-25 DIAGNOSIS — I6330 Cerebral infarction due to thrombosis of unspecified cerebral artery: Secondary | ICD-10-CM

## 2013-06-25 DIAGNOSIS — G8110 Spastic hemiplegia affecting unspecified side: Secondary | ICD-10-CM

## 2013-06-25 DIAGNOSIS — I10 Essential (primary) hypertension: Secondary | ICD-10-CM

## 2013-06-25 DIAGNOSIS — I69991 Dysphagia following unspecified cerebrovascular disease: Secondary | ICD-10-CM

## 2013-06-25 LAB — GLUCOSE, CAPILLARY
Glucose-Capillary: 194 mg/dL — ABNORMAL HIGH (ref 70–99)
Glucose-Capillary: 98 mg/dL (ref 70–99)

## 2013-06-25 MED ORDER — SENNOSIDES-DOCUSATE SODIUM 8.6-50 MG PO TABS
2.0000 | ORAL_TABLET | Freq: Two times a day (BID) | ORAL | Status: DC
  Administered 2013-06-25 – 2013-06-29 (×7): 2 via ORAL
  Filled 2013-06-25 (×7): qty 2

## 2013-06-25 MED ORDER — BACITRACIN 500 UNIT/GM EX OINT: 1.0000 "application " | TOPICAL_OINTMENT | CUTANEOUS | Status: DC | PRN

## 2013-06-25 NOTE — Progress Notes (Signed)
Speech Language Pathology Daily Session Note  Patient Details  Name: Adam Callahan MRN: [id number] Date of Birth: Jun 07, 1961  Today's Date: 06/25/2013 Time: 1500-1530 Time Calculation (min): 30 min  Short Term Goals: Week 3: SLP Short Term Goal 1 (Week 3): Pt will utilize swallowing strategies with Dys 2 textures and thin liquids to minimize aspiration risk with Min cues SLP Short Term Goal 2 (Week 3): Pt will use external aids to demonstrate orientation x4 with Max cues SLP Short Term Goal 3 (Week 3): Pt will use speech intelligibility strategies at the word level during automatic speech tasks with Min cues SLP Short Term Goal 4 (Week 3): Pt will name common objects with 80% accuracy with Mod cues SLP Short Term Goal 5 (Week 3): Pt will identify at least 1 cognitive and 1 physical impairment with Max cues  Skilled Therapeutic Interventions: Treatment focused on cognitive-linguistic and speech goals. Upon arrival pt was up in his chair with facial grimacing; when asked what his pain level was he replied "bad" (RN made aware and administered medication). SLP reviewed use of call bell, and pt identified button to call for help with supervision question cues. Pt labeled pictures of common objects, spontaneously naming: jacket, pussycat, fingers, hands, football, basketball, and apples. He required Mod-Max cues to name 11 additional pictures of common objects. Pt communicated his desire to return to bed utilizing multimodal communication with Min A. Pt spontaneously generated conversational level speech x1, clearing describing that it was too cold in his room but that it had been too hot earlier.   FIM:  Comprehension Comprehension Mode: Auditory Comprehension: 3-Understands basic 50 - 74% of the time/requires cueing 25 - 50%  of the time Expression Expression Mode: Verbal Expression: 3-Expresses basic 50 - 74% of the time/requires cueing 25 - 50% of the time. Needs to repeat parts of  sentences. Social Interaction Social Interaction: 4-Interacts appropriately 75 - 89% of the time - Needs redirection for appropriate language or to initiate interaction. Problem Solving Problem Solving: 2-Solves basic 25 - 49% of the time - needs direction more than half the time to initiate, plan or complete simple activities Memory Memory: 2-Recognizes or recalls 25 - 49% of the time/requires cueing 51 - 75% of the time  Pain Pain Assessment Pain Assessment: Faces Faces Pain Scale: Hurts even more Pain Location: Head Pain Intervention(s): RN made aware;Other (Comment) (medication administered by RN)  Therapy/Group: Individual Therapy   [name], M.A. CCC-SLP [phone]   [name] 06/25/2013, 4:16 PM

## 2013-06-25 NOTE — Progress Notes (Signed)
52 y.o. right-handed male with history of diabetes mellitus with peripheral neuropathy. Patient was independent prior to admission driving . Admitted 06/06/2013 with acute right-sided weakness and aphasia after being found on the floor by his family. MRI of the brain acute infarct left PCA territory. There is acute infarct involving the left mid and posterior temporal lobe extending into the left occipital lobe as well acute infarct present left thalamus and left midbrain, left inferior cerebellum. MRA of the head negative for aneurysm. MRI of the neck with occlusions versus embolus versus dissection of the left posterior cerebral artery and left posterior inferior cerebellar artery compatible with acute infarct. Echocardiogram with ejection fraction of 55% no wall motion abnormalities and grade 1 diastolic dysfunction. Carotid Dopplers with no ICA stenosis. Patient did not receive TPA. Neurology services consulted presently on aspirin and Plavix for CVA prophylaxis as well as subcutaneous Lovenox for DVT prophylaxis. Presently maintained on a dysphagia 1 thin liquid diet. Hemoglobin A1c 10.9 Subjective/Complaints: Needs to have BM, asked for supp  Cannot do ROS secondary to aphasia    Objective: Vital Signs: Blood pressure 121/78, pulse 81, temperature 97.7 F (36.5 C), temperature source Oral, resp. rate 17, height 5\' 10"  (1.778 m), weight 76.658 kg (169 lb), SpO2 98.00%. No results found. Results for orders placed during the hospital encounter of 06/09/13 (from the past 72 hour(s))  GLUCOSE, CAPILLARY     Status: Abnormal   Collection Time    06/22/13 11:36 AM      Result Value Range   Glucose-Capillary 260 (*) 70 - 99 mg/dL  GLUCOSE, CAPILLARY     Status: None   Collection Time    06/22/13  4:51 PM      Result Value Range   Glucose-Capillary 97  70 - 99 mg/dL  GLUCOSE, CAPILLARY     Status: Abnormal   Collection Time    06/22/13  8:37 PM      Result Value Range   Glucose-Capillary 211  (*) 70 - 99 mg/dL   Comment 1 Notify RN    CREATININE, SERUM     Status: None   Collection Time    06/23/13  7:04 AM      Result Value Range   Creatinine, Ser 0.69  0.50 - 1.35 mg/dL   GFR calc non Af Amer >90  >90 mL/min   GFR calc Af Amer >90  >90 mL/min   Comment: (NOTE)     The eGFR has been calculated using the CKD EPI equation.     This calculation has not been validated in all clinical situations.     eGFR's persistently <90 mL/min signify possible Chronic Kidney     Disease.  GLUCOSE, CAPILLARY     Status: Abnormal   Collection Time    06/23/13  7:18 AM      Result Value Range   Glucose-Capillary 158 (*) 70 - 99 mg/dL  GLUCOSE, CAPILLARY     Status: Abnormal   Collection Time    06/23/13 11:32 AM      Result Value Range   Glucose-Capillary 151 (*) 70 - 99 mg/dL  GLUCOSE, CAPILLARY     Status: None   Collection Time    06/23/13  5:06 PM      Result Value Range   Glucose-Capillary 91  70 - 99 mg/dL  GLUCOSE, CAPILLARY     Status: Abnormal   Collection Time    06/23/13  8:45 PM      Result Value Range  Glucose-Capillary 152 (*) 70 - 99 mg/dL   Comment 1 Notify RN    GLUCOSE, CAPILLARY     Status: Abnormal   Collection Time    06/24/13  7:33 AM      Result Value Range   Glucose-Capillary 124 (*) 70 - 99 mg/dL  GLUCOSE, CAPILLARY     Status: Abnormal   Collection Time    06/24/13 11:15 AM      Result Value Range   Glucose-Capillary 146 (*) 70 - 99 mg/dL  GLUCOSE, CAPILLARY     Status: Abnormal   Collection Time    06/24/13  5:45 PM      Result Value Range   Glucose-Capillary 139 (*) 70 - 99 mg/dL  GLUCOSE, CAPILLARY     Status: Abnormal   Collection Time    06/24/13  8:05 PM      Result Value Range   Glucose-Capillary 125 (*) 70 - 99 mg/dL  GLUCOSE, CAPILLARY     Status: Abnormal   Collection Time    06/25/13  7:20 AM      Result Value Range   Glucose-Capillary 131 (*) 70 - 99 mg/dL     HEENT: normal Cardio: RRR and no murmur Resp: CTA B/L and  unlabored GI: BS positive and NT, non distended Extremity:  Pulses positive and No Edema Skin:   Intact Neuro: Lethargic, Flat and Cranial Nerve Abnormalities decreased accomodation, R 0/5 Delt bi,trace tri, 0/5grip  3 minus R HF, 1/5 KE , ADF trace, Left side 4+/5,  Reduced sensation in RUE and RLE.  Speech with reduced output, strained quality, difficulty following commands Musc/Skel:  Normal, neck mild tenderness on left Gen NAD Mood and affect are labile  Assessment/Plan: 1. 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 Physiatrist is providing close team supervision and 24 hour management of active medical problems listed below. Physiatrist and rehab team continue to assess barriers to discharge/monitor patient progress toward functional and medical goals.  FIM: FIM - Bathing Bathing Steps Patient Completed: Chest;Right Arm;Abdomen;Front perineal area;Right upper leg;Left upper leg;Left lower leg (including foot);Buttocks Bathing: 4: Min-Patient completes 8-9 55f 10 parts or 75+ percent  FIM - Upper Body Dressing/Undressing Upper body dressing/undressing steps patient completed: Thread/unthread left sleeve of pullover shirt/dress;Pull shirt over trunk;Thread/unthread right sleeve of pullover shirt/dresss Upper body dressing/undressing: 4: Min-Patient completed 75 plus % of tasks FIM - Lower Body Dressing/Undressing Lower body dressing/undressing steps patient completed: Thread/unthread left pants leg;Don/Doff left sock;Thread/unthread right pants leg;Thread/unthread left underwear leg;Don/Doff right sock Lower body dressing/undressing: 3: Mod-Patient completed 50-74% of tasks  FIM - Toileting Toileting steps completed by patient: Adjust clothing after toileting Toileting Assistive Devices: Grab bar or rail  for support Toileting: 2: Max-Patient completed 1 of 3 steps  FIM - Diplomatic Services operational officer Devices: Grab bars Toilet Transfers: 3-To toilet/BSC: Mod A (lift or lower assist);3-From toilet/BSC: Mod A (lift or lower assist)  FIM - Bed/Chair Transfer Bed/Chair Transfer Assistive Devices: Bed rails;Arm rests Bed/Chair Transfer: 4: Chair or W/C > Bed: Min A (steadying Pt. > 75%);4: Sit > Supine: Min A (steadying pt. > 75%/lift 1 leg)  FIM - Locomotion: Wheelchair Distance: 150 Locomotion: Wheelchair: 5: Travels 150 ft or more: maneuvers on rugs and over door sills with supervision, cueing or coaxing FIM - Locomotion: Ambulation Locomotion: Ambulation Assistive Devices: Other (comment) (L hallway  hand rail) Ambulation/Gait Assistance: 1: +2 Total assist Locomotion: Ambulation: 1: Travels less than 50 ft with maximal assistance (Pt: 25 - 49%)  Comprehension Comprehension Mode: Auditory Comprehension: 3-Understands basic 50 - 74% of the time/requires cueing 25 - 50%  of the time  Expression Expression Mode: Verbal Expression Assistive Devices: 6-Other (Comment) (dysarthic and aphaisic) Expression: 3-Expresses basic 50 - 74% of the time/requires cueing 25 - 50% of the time. Needs to repeat parts of sentences.  Social Interaction Social Interaction: 2-Interacts appropriately 25 - 49% of time - Needs frequent redirection.  Problem Solving Problem Solving: 2-Solves basic 25 - 49% of the time - needs direction more than half the time to initiate, plan or complete simple activities  Memory Memory: 2-Recognizes or recalls 25 - 49% of the time/requires cueing 51 - 75% of the time Medical Problem List and Plan:  1. Embolic left PCA infarct, right hemiparesis, right hemisensory deficits, right homonymous hemianopsia  2. DVT Prophylaxis/Anticoagulation: Subcutaneous Lovenox. Monitor platelet counts and any signs of bleeding  3. Pain Management: Tylenol as needed. Left neck  and occipital pain improved with low dose hydrocodone will D/C, not long term treatment will try kpad and sportscreme 4. Neuropsych: This patient is not capable of making decisions on his own behalf.  5. Dysphagia/aphasia. Dysphagia 1 and liquids. Monitor for any signs of aspiration. Followup speech therapy.  6. Diabetes mellitus with peripheral neuropathy, uncontrolled. Hemoglobin A1c 10.9. Lantus insulin titrate as per am CBG  Check CBGs a.c. and at bedtime,SSI. Schedule meal coverage Provide diabetic teaching.  7. Hyperlipidemia. Lipitor  8. Emotional lability after stroke improving on sertraline 9.  Constipation , adjust bowel program LOS (Days) 16 A FACE TO FACE EVALUATION WAS PERFORMED  KIRSTEINS,ANDREW E 06/25/2013, 8:17 AM

## 2013-06-25 NOTE — Progress Notes (Signed)
Physical Therapy Note  Patient Details  Name: KRUZ CHIU MRN: [id number] Date of Birth: 1960-08-12 Today's Date: 06/25/2013  1:00 - 1:50 50 minutes 10 minutes missed secondary to bowel issues. Patient denies pain.  Attempted to see patient 2x in am. Patient complaining of stomach issues needing to have bowel movement and requesting I return later. Patient resting in bed upon entering room. Patient's clothes and linen are soaked with urine. NT and I assisted patient with changing linen, brief, and clothes. Patient supine to sit with min assist. Patient sit to stand and stand pivot to wheelchair with mod assist. Patient pushed in wheelchair to gym. Patient ambulated 30 feet x 2 with +1 mod assist using railing in hallway. Patient progressed right LE independently but required verbal cueing to decrease adduction/foot placement. Patient with decreased weight shift and stance time on right but was able to maintain right hip and knee without manual assistance. Patient transferred with mod assist to Nustep and exercised 2.5 minutes x 2 on level 2 working on right LE hip and knee extension. Patient worked on wheelchair mobility in home like setting (apartment) using hemi technique. Patient required cueing to attend to objects on right - running into door jams several times. Patient practiced mobility through obstacle course of cones - still requiring cueing to attend to right. Patient left with OT in gym.    Arelia Longest M 06/25/2013, 2:04 PM

## 2013-06-25 NOTE — Progress Notes (Signed)
Occupational Therapy Session Note  Patient Details  Name: TAJE LITTLER MRN: [id number] Date of Birth: [birth year]  Today's Date: 06/25/2013 Time: 0865-7846 and 9629-5284 Time Calculation (min): 60 min and 42 min   Short Term Goals: Week 1:  OT Short Term Goal 1 (Week 1): Pt will retrieve 2 grooming items on right side with cues  OT Short Term Goal 1 - Progress (Week 1): Met OT Short Term Goal 2 (Week 1): Pt will complete toilet transfer with mod assist OT Short Term Goal 2 - Progress (Week 1): Met OT Short Term Goal 3 (Week 1): Pt will complete LB dressing with max assist OT Short Term Goal 3 - Progress (Week 1): Met OT Short Term Goal 4 (Week 1): Pt will complete UB dressing with max assist  Focus on LTGs  Skilled Therapeutic Interventions/Progress Updates:    Session 1: Pt seen for ADL retraining with focus on family education, functional transfers, standing balance, and hemi dressing technique. Pt received supine in bed with report of stomach pain secondary to not having a BM however agreeable to therapy at this time. Pt declining breakfast when offered. Completed bathing and dressing at sink. Pt's god daughter (caregiver) assisted with standing balance and managing clothing around waist. Demonstrated good body mechanics during task and min cues for technique for safety. Pt reported needing to use bathroom and therapist assisted with transfer w/c>toilet min assist. Pt completed several sit<>stand at toilet to assist with BM and somewhat successful. Caregiver assisted with hygiene and transfer back to chair with carryover of technique used in LB dressing. Caregiver reports she feels confident in her ability to assist with toilet task and toilet transfer however "would not mind" more training. Agreed to come in this PM for more training and on Monday as well. Pt left sitting in w/c and agreed to try eating some breakfast.   Session 2: Therapy session focused on tub transfers and NMR to RUE.  Pt received after PT session and was fatigued but willing to participate in therapy at this time. Practiced tub transfer x6 trials to decrease amount of cues being provided secondary to cognitive deficits. Pt required min physical assist and max cues initially for task and progressing to min-mod cues required. Engaged in NMR to RUE using PNF D1 flexion/extension while supine on mat table. Pt with active shoulder retraction with gravity assisted. Engaged in AROM while sitting edge of mat table and pt with active shoulder adduction and slight retraction. At end of session pt propelled self in w/c back to room with min cues for positioning of RUE. Pt left with quick release belt donned and all needs in reach.   Therapy Documentation Precautions:  Precautions Precautions: Fall Precaution Comments: right inattention Restrictions Weight Bearing Restrictions: No General:   Vital Signs:   Pain: Pt reporting pain in stomach secondary to difficulty having BM.  See FIM for current functional status  Therapy/Group: Individual Therapy  Majed Pellegrin, Vara Guardian 06/25/2013, 10:05 AM

## 2013-06-25 NOTE — Progress Notes (Signed)
Spoke with pt in regards for readiness of nighttime CPAP, pt declines use tonight. RT will assist as needed.

## 2013-06-25 NOTE — Progress Notes (Signed)
Social Work [name], male   DOB: 08/28/1960, 52 y.o.   MRN: [id number] Met with pt to discuss DME he was agreeable to wheelchair, bsc and tub bench.  Pref to use [employer] since Aunt works for them.  Pt requires a lightweight wheelchair to be able to self propel And uses for his self care.  He is unable to self propel a standard weight wheelchair.  Aunt and god daughter to be in Sat at 9;00-12;00 for education and on Monday.  Prepare for discharge  Tuesday.

## 2013-06-26 ENCOUNTER — Inpatient Hospital Stay (HOSPITAL_COMMUNITY): Payer: BC Managed Care – PPO

## 2013-06-26 ENCOUNTER — Inpatient Hospital Stay (HOSPITAL_COMMUNITY): Payer: BC Managed Care – PPO | Admitting: Physical Therapy

## 2013-06-26 DIAGNOSIS — I6330 Cerebral infarction due to thrombosis of unspecified cerebral artery: Secondary | ICD-10-CM

## 2013-06-26 DIAGNOSIS — I69921 Dysphasia following unspecified cerebrovascular disease: Secondary | ICD-10-CM

## 2013-06-26 DIAGNOSIS — G8110 Spastic hemiplegia affecting unspecified side: Secondary | ICD-10-CM

## 2013-06-26 DIAGNOSIS — I69991 Dysphagia following unspecified cerebrovascular disease: Secondary | ICD-10-CM

## 2013-06-26 LAB — GLUCOSE, CAPILLARY
Glucose-Capillary: 141 mg/dL — ABNORMAL HIGH (ref 70–99)
Glucose-Capillary: 108 mg/dL — ABNORMAL HIGH (ref 70–99)
Glucose-Capillary: 156 mg/dL — ABNORMAL HIGH (ref 70–99)

## 2013-06-26 MED ORDER — ACETAMINOPHEN 500 MG PO TABS
500.0000 mg | ORAL_TABLET | Freq: Three times a day (TID) | ORAL | Status: DC
  Administered 2013-06-26 – 2013-06-29 (×10): 500 mg via ORAL
  Filled 2013-06-26 (×15): qty 1

## 2013-06-26 NOTE — Progress Notes (Addendum)
52 y.o. right-handed male with history of diabetes mellitus with peripheral neuropathy. Patient was independent prior to admission driving . Admitted 06/06/2013 with acute right-sided weakness and aphasia after being found on the floor by his family. MRI of the brain acute infarct left PCA territory. There is acute infarct involving the left mid and posterior temporal lobe extending into the left occipital lobe as well acute infarct present left thalamus and left midbrain, left inferior cerebellum. MRA of the head negative for aneurysm. MRI of the neck with occlusions versus embolus versus dissection of the left posterior cerebral artery and left posterior inferior cerebellar artery compatible with acute infarct. Echocardiogram with ejection fraction of 55% no wall motion abnormalities and grade 1 diastolic dysfunction. Carotid Dopplers with no ICA stenosis. Patient did not receive TPA. Neurology services consulted presently on aspirin and Plavix for CVA prophylaxis as well as subcutaneous Lovenox for DVT prophylaxis. Presently maintained on a dysphagia 1 thin liquid diet. Hemoglobin A1c 10.9 Subjective/Complaints: Complains of headache, partially relieved with heat/tylenol Cannot do ROS secondary to aphasia    Objective: Vital Signs: Blood pressure 143/87, pulse 98, temperature 97.9 F (36.6 C), temperature source Oral, resp. rate 18, height 5\' 10"  (1.778 m), weight 76.658 kg (169 lb), SpO2 98.00%. No results found. Results for orders placed during the hospital encounter of 06/09/13 (from the past 72 hour(s))  GLUCOSE, CAPILLARY     Status: Abnormal   Collection Time    06/23/13 11:32 AM      Result Value Range   Glucose-Capillary 151 (*) 70 - 99 mg/dL  GLUCOSE, CAPILLARY     Status: None   Collection Time    06/23/13  5:06 PM      Result Value Range   Glucose-Capillary 91  70 - 99 mg/dL  GLUCOSE, CAPILLARY     Status: Abnormal   Collection Time    06/23/13  8:45 PM      Result Value Range    Glucose-Capillary 152 (*) 70 - 99 mg/dL   Comment 1 Notify RN    GLUCOSE, CAPILLARY     Status: Abnormal   Collection Time    06/24/13  7:33 AM      Result Value Range   Glucose-Capillary 124 (*) 70 - 99 mg/dL  GLUCOSE, CAPILLARY     Status: Abnormal   Collection Time    06/24/13 11:15 AM      Result Value Range   Glucose-Capillary 146 (*) 70 - 99 mg/dL  GLUCOSE, CAPILLARY     Status: Abnormal   Collection Time    06/24/13  5:45 PM      Result Value Range   Glucose-Capillary 139 (*) 70 - 99 mg/dL  GLUCOSE, CAPILLARY     Status: Abnormal   Collection Time    06/24/13  8:05 PM      Result Value Range   Glucose-Capillary 125 (*) 70 - 99 mg/dL  GLUCOSE, CAPILLARY     Status: Abnormal   Collection Time    06/25/13  7:20 AM      Result Value Range   Glucose-Capillary 131 (*) 70 - 99 mg/dL  GLUCOSE, CAPILLARY     Status: Abnormal   Collection Time    06/25/13 11:24 AM      Result Value Range   Glucose-Capillary 194 (*) 70 - 99 mg/dL  GLUCOSE, CAPILLARY     Status: None   Collection Time    06/25/13  4:53 PM      Result Value Range  Glucose-Capillary 98  70 - 99 mg/dL   Comment 1 Notify RN    GLUCOSE, CAPILLARY     Status: Abnormal   Collection Time    06/25/13  9:04 PM      Result Value Range   Glucose-Capillary 176 (*) 70 - 99 mg/dL     HEENT: normal Cardio: RRR and no murmur Resp: CTA B/L and unlabored GI: BS positive and NT, non distended Extremity:  Pulses positive and No Edema Skin:   Intact Neuro: Lethargic, Flat and Cranial Nerve Abnormalities decreased accomodation, R 0/5 Delt bi,trace tri, 0/5grip  3 minus R HF, 1/5 KE , ADF trace, Left side 4+/5,  Reduced sensation in RUE and RLE.  Speech with reduced output, strained quality, difficulty following commands Musc/Skel:  Normal, neck mild tenderness on left Gen NAD Mood and affect are labile  Assessment/Plan: 1. 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 Physiatrist is providing close team supervision and 24 hour management of active medical problems listed below. Physiatrist and rehab team continue to assess barriers to discharge/monitor patient progress toward functional and medical goals.  FIM: FIM - Bathing Bathing Steps Patient Completed: Chest;Right Arm;Abdomen;Front perineal area;Right upper leg;Left upper leg;Left lower leg (including foot) Bathing: 3: Mod-Patient completes 5-7 63f 10 parts or 50-74%  FIM - Upper Body Dressing/Undressing Upper body dressing/undressing steps patient completed: Thread/unthread left sleeve of pullover shirt/dress;Pull shirt over trunk Upper body dressing/undressing: 3: Mod-Patient completed 50-74% of tasks FIM - Lower Body Dressing/Undressing Lower body dressing/undressing steps patient completed: Thread/unthread left pants leg;Thread/unthread right pants leg;Don/Doff right sock Lower body dressing/undressing: 3: Mod-Patient completed 50-74% of tasks  FIM - Toileting Toileting steps completed by patient: Adjust clothing prior to toileting Toileting Assistive Devices: Grab bar or rail for support Toileting: 2: Max-Patient completed 1 of 3 steps  FIM - Diplomatic Services operational officer Devices: Grab bars Toilet Transfers: 4-From toilet/BSC: Min A (steadying Pt. > 75%);4-To toilet/BSC: Min A (steadying Pt. > 75%)  FIM - Bed/Chair Transfer Bed/Chair Transfer Assistive Devices: Bed rails;Arm rests Bed/Chair Transfer: 4: Supine > Sit: Min A (steadying Pt. > 75%/lift 1 leg);3: Bed > Chair or W/C: Mod A (lift or lower assist);3: Chair or W/C > Bed: Mod A (lift or lower assist)  FIM - Locomotion: Wheelchair Distance: 150 Locomotion: Wheelchair: 5: Travels 150 ft or more: maneuvers on rugs and over door sills with supervision, cueing or coaxing FIM -  Locomotion: Ambulation Locomotion: Ambulation Assistive Devices: Other (comment) (railing in hallway) Ambulation/Gait Assistance: 3: Mod assist Locomotion: Ambulation: 2: Travels 50 - 149 ft with moderate assistance (Pt: 50 - 74%)  Comprehension Comprehension Mode: Auditory Comprehension: 3-Understands basic 50 - 74% of the time/requires cueing 25 - 50%  of the time  Expression Expression Mode: Verbal Expression Assistive Devices: 6-Other (Comment) (dysarthic and aphaisic) Expression: 3-Expresses basic 50 - 74% of the time/requires cueing 25 - 50% of the time. Needs to repeat parts of sentences.  Social Interaction Social Interaction: 2-Interacts appropriately 25 - 49% of time - Needs frequent redirection.  Problem Solving Problem Solving: 2-Solves basic 25 - 49% of the time - needs direction more than half the time to initiate, plan or complete simple activities  Memory Memory: 2-Recognizes or recalls 25 - 49% of the time/requires cueing 51 - 75% of the time Medical Problem List and Plan:  1.  Embolic left PCA infarct, right hemiparesis, right hemisensory deficits, right homonymous hemianopsia  2. DVT Prophylaxis/Anticoagulation: Subcutaneous Lovenox. Monitor platelet counts and any signs of bleeding  3. Pain Management:  Left neck and occipital pain recurrent  -will schedule 500mg  tid tylenol  -continue with  kpad and sportscreme  -continue tramadol for more significant pain (i stopped hydrocodone today) 4. Neuropsych: This patient is not capable of making decisions on his own behalf.  5. Dysphagia/aphasia. Dysphagia 1 and liquids. Monitor for any signs of aspiration. Followup speech therapy.  6. Diabetes mellitus with peripheral neuropathy, uncontrolled. Hemoglobin A1c 10.9. Lantus insulin titrated with improvement  -ssi covg ac and hs  -dietary education 7. Hyperlipidemia. Lipitor  8. Emotional lability after stroke improving on sertraline 9.  Constipation , adjusted bowel  program LOS (Days) 17 A FACE TO FACE EVALUATION WAS PERFORMED  Adam Callahan T 06/26/2013, 8:17 AM

## 2013-06-26 NOTE — Progress Notes (Signed)
Placed patient on auto titrate Max 20 Min 4. Patient is tolerating well.

## 2013-06-26 NOTE — Progress Notes (Signed)
Speech Language Pathology Daily Session Note  Patient Details  Name: Adam Callahan MRN: [id number] Date of Birth: Dec 01, 1960  Today's Date: 06/26/2013 Time: 4696-2952 Time Calculation (min): 45 min  Short Term Goals: Week 3: SLP Short Term Goal 1 (Week 3): Pt will utilize swallowing strategies with Dys 2 textures and thin liquids to minimize aspiration risk with Min cues SLP Short Term Goal 2 (Week 3): Pt will use external aids to demonstrate orientation x4 with Max cues SLP Short Term Goal 3 (Week 3): Pt will use speech intelligibility strategies at the word level during automatic speech tasks with Min cues SLP Short Term Goal 4 (Week 3): Pt will name common objects with 80% accuracy with Mod cues SLP Short Term Goal 5 (Week 3): Pt will identify at least 1 cognitive and 1 physical impairment with Max cues  Skilled Therapeutic Interventions: Treatment focused on pt/family education with aunt present for session. SLP facilitated session with education regarding patient's current cognitive-linguistic, speech, and swallowing function. SLP demonstrated appropriate cueing for PO intake and linguistic tasks, and pt's aunt returned demonstration adequately. With Max cues and models from aunt, pt was able to accurately name common objects. He also demonstrated intellectual awareness of speech and physical impairments with Min question cues. Handout was provided with additional information regarding current diet textures. Pt's aunt verbalized her understanding of the information provided, and shared that she works in home health and is trained as a Physicist, medical, so she feels comfortable being the pt's caregiver. All questions were answered. Continue plan of care.   FIM:  Comprehension Comprehension Mode: Auditory Comprehension: 3-Understands basic 50 - 74% of the time/requires cueing 25 - 50%  of the time Expression Expression Mode: Verbal Expression: 3-Expresses basic 50 - 74% of the time/requires  cueing 25 - 50% of the time. Needs to repeat parts of sentences. Social Interaction Social Interaction: 2-Interacts appropriately 25 - 49% of time - Needs frequent redirection. Problem Solving Problem Solving: 2-Solves basic 25 - 49% of the time - needs direction more than half the time to initiate, plan or complete simple activities Memory Memory: 2-Recognizes or recalls 25 - 49% of the time/requires cueing 51 - 75% of the time FIM - Eating Eating Activity: 5: Needs verbal cues/supervision  Pain Pain Assessment Pain Assessment: Faces Faces Pain Scale: No hurt Pain Location: Head  Therapy/Group: Individual Therapy  [name], M.A. CCC-SLP [phone]   [name] 06/26/2013, 11:14 AM

## 2013-06-26 NOTE — Progress Notes (Signed)
Physical Therapy Weekly Progress Note  Patient Details  Name: Adam Callahan MRN: [id number] Date of Birth: 07-Aug-1960  Today's Date: 06/26/2013 Time: 1100-1200  Time Calculation (min): 60 min  Patient has met 3 of 4 short term goals.  Long-term goal for supine<>sit with supervision is partially met, as patient frequently requires min A with supine>sit. Pt has met short term goals addressing dynamic standing balance, gait, and bed<>w/c transfers. Pt progress has been limited by ongoing abdominal pain secondary to pt inability to have bowel movement.   Family education was scheduled to take place during today's PT session; however, family was not present for session. Remaining PT sessions will focus on hands-on family training to ensure patient safety upon discharge home.  Patient continues to demonstrate the following deficits: R hemiplegia, R-sided inattention, postural instability, sensory deficits, impaired coordination, cognitive impairments, and therefore will continue to benefit from skilled PT intervention to enhance overall performance with balance, postural control, ability to compensate for deficits, functional use of  right lower extremity, attention, awareness and coordination.  Patient progressing toward long term goals..  Continue plan of care. Long-term goal addressing negotiation of single step (to enter home) without hand rail has been discharged at this time due to decreased stability/independence with gait. Will plan to provide family with hands-on education/training of negotiation of single step with patient in wheelchair.  PT Short Term Goals Week 2:  PT Short Term Goal 1 (Week 2): Pt will perform dynamic standing activities with consistent mod A. PT Short Term Goal 1 - Progress (Week 2): Met PT Short Term Goal 2 (Week 2): Patient will perform supine<>sit with supervision. PT Short Term Goal 2 - Progress (Week 2): Partly met PT Short Term Goal 3 (Week 2): Patient will  perform w/c<>mat transfer with consistent min A. PT Short Term Goal 3 - Progress (Week 2): Met PT Short Term Goal 4 (Week 2): Patient will ambulate 70' with total A of single therapist. PT Short Term Goal 4 - Progress (Week 2): Met Week 3:  PT Short Term Goal 1 (Week 3): = LTG's secondary to expected discharge  Skilled Therapeutic Interventions/Progress Updates:    Pt received seated in w/c in room; agreeable to therapy. Pt oriented x4. Session planned to focus on family education; however, family not present for session. Therefore, interventions focused on increasing pt independence with gait, functional tranfers. Self-propulsion of w/c via L hemi-technique x150' with supervision, min cueing for obstacle negotiation on R side. Pt performed multiple sit<>stand transfers with close supervision. Multiple stand-pivot transfers from bed<>chair with min A in both directions; min cueing (gesturing) for locking R w/c brake prior to transferring.   Performed 8 gait trials x25' with LUE support at hallway hand rail, mod A-maxA for stability/balance, manual facilitation of upright posture inferior to L ribs and at R axilla. During initial trial, pt exhibits downward gaze, limited bilat hip extension throughout gait cycle, and significant RLE adduction during RLE swing phase of gait. Next 3 trials performed with 1lb cuff weight at R ankle to increase proprioceptive input at RLE (to improve foot placement of R foot during step). Subsequent 2 trials without ankle cuff weight with effective carryover of improved RLE placement. Final 2 trials (performed with consistent mod A) with multimodal cueing for upright posture. W/c mobility x150', supervision, to return to room. Squat-pivot from w/c>bed with min A; pt with safe/proper setup without cueing. Supine>sit with supervision, subtle cueing for technique. Therapist departed with pt semi-reclined in bed with 3  rails up, bed alarm on, and all needs within reach.  Therapy  Documentation Precautions:  Precautions Precautions: Fall Precaution Comments: right inattention Restrictions Weight Bearing Restrictions: No Pain: Pain Assessment Pain Assessment: 0-10 Pain Score: 5  Faces Pain Scale: No hurt Pain Type: Acute pain Pain Location: Abdomen Pain Descriptors / Indicators: Discomfort Pain Onset: Gradual Patients Stated Pain Goal: 3 Pain Intervention(s): Ambulation/increased activity;RN made aware Multiple Pain Sites: No  See FIM for current functional status  Therapy/Group: Individual Therapy  Hobble, Lorenda Ishihara 06/26/2013, 12:25 PM

## 2013-06-26 NOTE — Progress Notes (Signed)
Occupational Therapy Weekly Progress Note  Patient Details  Name: Adam Callahan MRN: [id number] Date of Birth: 1960/09/19  Today's Date: 06/26/2013 Time: 1000-1100 Time Calculation (min): 60 min  Patient has met 3 of 4 short term goals.  Patient has progressed in therapy and has recently been limited secondary to pain in abdomen area due to not having bowel movement. Patient plans to discharge home on 12/9 with caregivers. One of the caregivers has participated in family training and demonstrates good safety. Patient's aunt who is the other caregiver did not attend family education when scheduled. Speech therapist spoke with this caregiver and she is a CNA and has history of being a PTA and reports being confident in assisting with patient.   Patient continues to demonstrate the following deficits: decreased awareness, R hemiplegia, decreased coordination, decreased strength, decreased endurance, decreased functional use of RUE/RLE, impaired cognition, decreased postural control in standing, decreased safety awareness and therefore will continue to benefit from skilled OT intervention to enhance overall performance with BADL.  Patient progressing toward long term goals..  Continue plan of care.  OT Short Term Goals Week 2:  OT Short Term Goal 1 (Week 2): Pt will complete LB dressing with mod assist consistently  OT Short Term Goal 1 - Progress (Week 2): Met OT Short Term Goal 2 (Week 2): Pt will complete bathing with min assist and no more then 1 verbal cue OT Short Term Goal 2 - Progress (Week 2): Met OT Short Term Goal 3 (Week 2): Pt will complete UB dressing with min assist and no cues for hemi dressing technique OT Short Term Goal 3 - Progress (Week 2): Met OT Short Term Goal 4 (Week 2): Pt will compete functional transfers with min verbal cues for awareness of RLE/RUE OT Short Term Goal 4 - Progress (Week 2): Not met Week 3: Focus on LTGs  Skilled Therapeutic Interventions/Progress  Updates:    Pt seen for ADL retraining with focus on postural control in standing, awareness of RUE, activity tolerance, and functional transfers. Intended to complete scheduled family education with pt's aunt who will be assisting with bathing at home however she did not attend. Pt completed bathing in shower with increased awareness of RUE during task as he was initiating proper positioning throughout bathing however decreased carryover during dressing and sit<>stand. Required mod cues for positioning of BLE during transfers. Pt recalled hemi dressing techniques and required assist for crossover with LLE when threading. Pt able to bring LLE over RLE however unable to sustain position without assist. Required min assist of sit<>stand and positioning of RUE in standing. Cued to use mirror as visual feedback for postural control. Applied shoe buttons to shoe and pt with good carryover to use when fastening them. At end of session pt left sitting in w/c with QRB donned and all needs in reach.   Therapy Documentation Precautions:  Precautions Precautions: Fall Precaution Comments: right inattention Restrictions Weight Bearing Restrictions: No General:   Vital Signs:   Pain: Pain Assessment Pain Assessment: Faces Faces Pain Scale: No hurt Pain Location: Head ADL:   Exercises:   Other Treatments:    See FIM for current functional status  Therapy/Group: Individual Therapy  Daneil Dan 06/26/2013, 12:07 PM

## 2013-06-27 ENCOUNTER — Inpatient Hospital Stay (HOSPITAL_COMMUNITY): Payer: Self-pay | Admitting: Physical Therapy

## 2013-06-27 LAB — GLUCOSE, CAPILLARY
Glucose-Capillary: 159 mg/dL — ABNORMAL HIGH (ref 70–99)
Glucose-Capillary: 165 mg/dL — ABNORMAL HIGH (ref 70–99)
Glucose-Capillary: 101 mg/dL — ABNORMAL HIGH (ref 70–99)
Glucose-Capillary: 107 mg/dL — ABNORMAL HIGH (ref 70–99)

## 2013-06-27 NOTE — Progress Notes (Signed)
Physical Therapy Note  Patient Details  Name: Adam Callahan MRN: [id number] Date of Birth: 05/21/1961 Today's Date: 06/27/2013  1500 Pt missed 60 minute PT session secondary to refusal/diarrhea.    [name],JIM 06/27/2013, 3:01 PM

## 2013-06-27 NOTE — Progress Notes (Signed)
52 y.o. right-handed male with history of diabetes mellitus with peripheral neuropathy. Patient was independent prior to admission driving . Admitted 06/06/2013 with acute right-sided weakness and aphasia after being found on the floor by his family. MRI of the brain acute infarct left PCA territory. There is acute infarct involving the left mid and posterior temporal lobe extending into the left occipital lobe as well acute infarct present left thalamus and left midbrain, left inferior cerebellum. MRA of the head negative for aneurysm. MRI of the neck with occlusions versus embolus versus dissection of the left posterior cerebral artery and left posterior inferior cerebellar artery compatible with acute infarct. Echocardiogram with ejection fraction of 55% no wall motion abnormalities and grade 1 diastolic dysfunction. Carotid Dopplers with no ICA stenosis. Patient did not receive TPA. Neurology services consulted presently on aspirin and Plavix for CVA prophylaxis as well as subcutaneous Lovenox for DVT prophylaxis. Presently maintained on a dysphagia 1 thin liquid diet. Hemoglobin A1c 10.9   Subjective/Complaints: Headache off and on. Slept well. No new issues A 12 point review of systems has been performed and if not noted above is otherwise negative.     Objective: Vital Signs: Blood pressure 140/93, pulse 109, temperature 97.5 F (36.4 C), temperature source Oral, resp. rate 18, height 5\' 10"  (1.778 m), weight 76.658 kg (169 lb), SpO2 96.00%. No results found. Results for orders placed during the hospital encounter of 06/09/13 (from the past 72 hour(s))  GLUCOSE, CAPILLARY     Status: Abnormal   Collection Time    06/24/13 11:15 AM      Result Value Range   Glucose-Capillary 146 (*) 70 - 99 mg/dL  GLUCOSE, CAPILLARY     Status: Abnormal   Collection Time    06/24/13  5:45 PM      Result Value Range   Glucose-Capillary 139 (*) 70 - 99 mg/dL  GLUCOSE, CAPILLARY     Status: Abnormal    Collection Time    06/24/13  8:05 PM      Result Value Range   Glucose-Capillary 125 (*) 70 - 99 mg/dL  GLUCOSE, CAPILLARY     Status: Abnormal   Collection Time    06/25/13  7:20 AM      Result Value Range   Glucose-Capillary 131 (*) 70 - 99 mg/dL  GLUCOSE, CAPILLARY     Status: Abnormal   Collection Time    06/25/13 11:24 AM      Result Value Range   Glucose-Capillary 194 (*) 70 - 99 mg/dL  GLUCOSE, CAPILLARY     Status: None   Collection Time    06/25/13  4:53 PM      Result Value Range   Glucose-Capillary 98  70 - 99 mg/dL   Comment 1 Notify RN    GLUCOSE, CAPILLARY     Status: Abnormal   Collection Time    06/25/13  9:04 PM      Result Value Range   Glucose-Capillary 176 (*) 70 - 99 mg/dL  GLUCOSE, CAPILLARY     Status: Abnormal   Collection Time    06/26/13  7:31 AM      Result Value Range   Glucose-Capillary 141 (*) 70 - 99 mg/dL   Comment 1 Notify RN    GLUCOSE, CAPILLARY     Status: Abnormal   Collection Time    06/26/13 11:54 AM      Result Value Range   Glucose-Capillary 135 (*) 70 - 99 mg/dL   Comment 1  Notify RN    GLUCOSE, CAPILLARY     Status: Abnormal   Collection Time    06/26/13  4:40 PM      Result Value Range   Glucose-Capillary 108 (*) 70 - 99 mg/dL  GLUCOSE, CAPILLARY     Status: Abnormal   Collection Time    06/26/13  8:57 PM      Result Value Range   Glucose-Capillary 156 (*) 70 - 99 mg/dL   Comment 1 Notify RN    GLUCOSE, CAPILLARY     Status: Abnormal   Collection Time    06/27/13  7:11 AM      Result Value Range   Glucose-Capillary 159 (*) 70 - 99 mg/dL   Comment 1 Notify RN       HEENT: normal Cardio: RRR and no murmur Resp: CTA B/L and unlabored GI: BS positive and NT, non distended Extremity:  Pulses positive and No Edema Skin:   Intact Neuro: Lethargic, Flat and Cranial Nerve Abnormalities decreased accomodation, R 0/5 Delt bi,trace tri, 0/5grip  3 minus R HF, 1/5 KE , ADF trace, Left side 4+/5,  Reduced sensation in RUE and  RLE.  Speech with reduced output, strained quality, difficulty following commands Musc/Skel:  Normal, neck mild tenderness on left Gen NAD Mood and affect are labile  Assessment/Plan: 1. 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 Physiatrist is providing close team supervision and 24 hour management of active medical problems listed below. Physiatrist and rehab team continue to assess barriers to discharge/monitor patient progress toward functional and medical goals.  FIM: FIM - Bathing Bathing Steps Patient Completed: Chest;Right Arm;Abdomen;Front perineal area;Right upper leg;Left upper leg;Left lower leg (including foot);Right lower leg (including foot) Bathing: 4: Min-Patient completes 8-9 68f 10 parts or 75+ percent  FIM - Upper Body Dressing/Undressing Upper body dressing/undressing steps patient completed: Thread/unthread left sleeve of pullover shirt/dress;Pull shirt over trunk;Put head through opening of pull over shirt/dress Upper body dressing/undressing: 4: Min-Patient completed 75 plus % of tasks FIM - Lower Body Dressing/Undressing Lower body dressing/undressing steps patient completed: Thread/unthread left pants leg;Thread/unthread right pants leg;Don/Doff right sock;Thread/unthread left underwear leg;Thread/unthread right underwear leg;Fasten/unfasten right shoe;Fasten/unfasten left shoe;Don/Doff left shoe Lower body dressing/undressing: 3: Mod-Patient completed 50-74% of tasks  FIM - Toileting Toileting steps completed by patient: Adjust clothing prior to toileting Toileting Assistive Devices: Grab bar or rail for support Toileting: 2: Max-Patient completed 1 of 3 steps  FIM - Diplomatic Services operational officer Devices: Grab bars Toilet Transfers: 4-From toilet/BSC: Min A (steadying  Pt. > 75%);4-To toilet/BSC: Min A (steadying Pt. > 75%)  FIM - Bed/Chair Transfer Bed/Chair Transfer Assistive Devices: Bed rails;Arm rests Bed/Chair Transfer: 4: Bed > Chair or W/C: Min A (steadying Pt. > 75%);4: Chair or W/C > Bed: Min A (steadying Pt. > 75%);5: Sit > Supine: Supervision (verbal cues/safety issues)  FIM - Locomotion: Wheelchair Distance: 150 Locomotion: Wheelchair: 5: Travels 150 ft or more: maneuvers on rugs and over door sills with supervision, cueing or coaxing FIM - Locomotion: Ambulation Locomotion: Ambulation Assistive Devices: Other (comment) (L hand rail (hallway)) Ambulation/Gait Assistance: 2: Max assist Locomotion: Ambulation: 1: Travels less than 50 ft with maximal assistance (Pt: 25 - 49%)  Comprehension Comprehension Mode: Auditory Comprehension: 3-Understands basic 50 - 74% of the time/requires cueing 25 - 50%  of the time  Expression Expression  Mode: Verbal Expression Assistive Devices: 6-Other (Comment) (dysarthic and aphaisic) Expression: 4-Expresses basic 75 - 89% of the time/requires cueing 10 - 24% of the time. Needs helper to occlude trach/needs to repeat words.  Social Interaction Social Interaction: 3-Interacts appropriately 50 - 74% of the time - May be physically or verbally inappropriate.  Problem Solving Problem Solving: 3-Solves basic 50 - 74% of the time/requires cueing 25 - 49% of the time  Memory Memory: 3-Recognizes or recalls 50 - 74% of the time/requires cueing 25 - 49% of the time Medical Problem List and Plan:  1. Embolic left PCA infarct, right hemiparesis, right hemisensory deficits, right homonymous hemianopsia  2. DVT Prophylaxis/Anticoagulation: Subcutaneous Lovenox. Monitor platelet counts and any signs of bleeding  3. Pain Management:  Left neck and occipital pain recurrent  -scheduled 500mg  tid tylenol  -continue with  kpad and sportscreme  -continue tramadol for more significant pain (i stopped hydrocodone  today) 4. Neuropsych: This patient is not capable of making decisions on his own behalf.  5. Dysphagia/aphasia. Dysphagia 1 and liquids. Monitor for any signs of aspiration. Followup speech therapy.  6. Diabetes mellitus with peripheral neuropathy, uncontrolled. Hemoglobin A1c 10.9. Lantus insulin titrated with improved control  -ssi covg ac and hs  -dietary education 7. Hyperlipidemia. Lipitor  8. Emotional lability after stroke improving on sertraline 9.  Constipation , adjusted bowel program 10. OSA- cpap LOS (Days) 18 A FACE TO FACE EVALUATION WAS PERFORMED  SWARTZ,ZACHARY T 06/27/2013, 8:01 AM

## 2013-06-27 NOTE — Progress Notes (Signed)
Patient refused CPAP for the night. Patient on room air with Sp02=96%

## 2013-06-28 ENCOUNTER — Inpatient Hospital Stay (HOSPITAL_COMMUNITY): Payer: BC Managed Care – PPO | Admitting: Physical Therapy

## 2013-06-28 ENCOUNTER — Inpatient Hospital Stay (HOSPITAL_COMMUNITY): Payer: BC Managed Care – PPO | Admitting: Occupational Therapy

## 2013-06-28 ENCOUNTER — Inpatient Hospital Stay (HOSPITAL_COMMUNITY): Payer: Self-pay | Admitting: Occupational Therapy

## 2013-06-28 ENCOUNTER — Inpatient Hospital Stay (HOSPITAL_COMMUNITY): Payer: Self-pay | Admitting: Physical Therapy

## 2013-06-28 ENCOUNTER — Inpatient Hospital Stay (HOSPITAL_COMMUNITY): Payer: BC Managed Care – PPO

## 2013-06-28 ENCOUNTER — Encounter (HOSPITAL_COMMUNITY): Payer: Self-pay

## 2013-06-28 LAB — GLUCOSE, CAPILLARY
Glucose-Capillary: 112 mg/dL — ABNORMAL HIGH (ref 70–99)
Glucose-Capillary: 174 mg/dL — ABNORMAL HIGH (ref 70–99)
Glucose-Capillary: 99 mg/dL (ref 70–99)

## 2013-06-28 NOTE — Progress Notes (Signed)
Physical Therapy Session Note  Patient Details  Name: Adam Callahan MRN: [id number] Date of Birth: 08/09/1960  Today's Date: 06/28/2013 Time: 0800-0900 60 min  Short Term Goals: Week 3:  PT Short Term Goal 1 (Week 3): = LTG's secondary to expected discharge  Skilled Therapeutic Interventions/Progress Updates:    Pt received semi-reclined in bed; agreeable to therapy. Pt found to have had incontinent BM; therefore, initial 30 minutes of session focused on bed mobility for changing brief/peri care and functional dynamic sitting/standing balance during LB dressing. See below for detailed description of cueing, assist required for bed mobility. Dynamic sitting balance: elevating bilat LE's while therapist assisted in threading pants on bilat LE's with SBA. Sit>stand with supervision. Dynamic standing balance without UE support: pt utilizing LUE to pull up pants with min A for stability/balance.  Self-propulsion of w/c x200' via L hemi-technique with supervision. Sit>stand from w/c using L w/c arm rest. Static standing with LUE support at L hallway handrail x3 minutes with SBA. See below for detailed description of gait trial. Session ended in pt room, where pt was left seated in w/c with OT present (for upcoming session) and all needs were within reach.  Therapy Documentation Precautions:  Precautions Precautions: Fall Precaution Comments: right inattention Restrictions Weight Bearing Restrictions: No Vital Signs: Therapy Vitals Temp: 97.7 F (36.5 C) Temp src: Oral Pulse Rate: 94 Resp: 17 BP: 134/81 mmHg Patient Position, if appropriate: Lying Oxygen Therapy SpO2: 100 % O2 Device: None (Room air) Mobility: Bed Mobility Bed Mobility: Rolling Right;Rolling Left;Sit to Supine;Sitting - Scoot to Edge of Bed;Supine to Sit;Scooting to [facility] Rolling Right: 5: Supervision Rolling Right Details (indicate cue type and reason): Demonstration, gesturing required to initiate rolling with  consistent effective return demonstration.  Rolling Left: 5: Supervision Rolling Left Details (indicate cue type and reason):  Supervision required due to decreased pt awareness of proximity to EOB on L side. Supine to Sit: 5: Supervision;4: Min assist Supine to Sit Details (indicate cue type and reason): Ranges from supervision to minA depending on level of pt fatigue, abdominal pain Sitting - Scoot to Edge of Bed: 5: Supervision Sitting - Scoot to [employer] of Bed Details: Visual cues/gestures for precautions/safety Sitting - Scoot to [employer] of Bed Details (indicate cue type and reason): Supervision due to decreased trunk control (tendency toward anterior trunk lean). Sit to Supine: 5: Supervision Sit to Supine - Details (indicate cue type and reason): Performs sit>supine wth supervision with gesturing cues to remind pt of technique to elevate RLE using LLE.  Scooting to [facility]: 5: Supervision Scooting to [facility] Details (indicate cue type and reason): Gesturing for hand placement at bed rails Transfers Transfers: Yes Sit to Stand: 5: Supervision;With armrests;From bed;From chair/3-in-1 Stand to Sit: 4: Min guard;4: Min assist Stand to Sit Details (indicate cue type and reason): Manual facilitation for placement Stand Pivot Transfers: 4: Min assist;3: Mod assist Squat Pivot Transfers: 4: Min assist Locomotion : Ambulation Ambulation: Yes Ambulation/Gait Assistance: 2: Max assist Ambulation Distance (Feet): 30 Feet Assistive device: Other (Comment) (L hallway hand rail) Ambulation/Gait Assistance Details: Verbal cues for precautions/safety;Manual facilitation for weight shifting;Tactile cues for posture Ambulation/Gait Assistance Details: Gait x30' with LUE support at hallway hand rail, step-to pattern. Pt required mod A for majority of gait trial, max A to recover from R knee buckle during RLE stance phase. Therapist providing manual facilitation of upright posture at R axilla, L ribcage. Pt with  improved RLE placement, improved postural control (increased bilat hip extension throughout  gait cycle), and improved RLE weightbearing . Pt continues to exhibit impulsivity, limited cervical/thoracic spine extension during gait.  Gait Gait: Yes Gait Pattern: Impaired Gait Pattern: Step-to pattern;Decreased step length - left;Decreased stance time - right;Decreased dorsiflexion - right;Decreased weight shift to right;Right flexed knee in stance;Narrow base of support;Trunk flexed Stairs / Additional Locomotion Stairs: Yes Stairs Assistance: 1: +2 Total assist Stairs Assistance Details: Tactile cues for sequencing;Tactile cues for placement;Verbal cues for technique;Verbal cues for precautions/safety;Manual facilitation for placement;Manual facilitation for weight shifting Stairs Assistance Details (indicate cue type and reason): Pt requires max A  for stability with stair negotiation, +2A for safety secondary to pt impulsivity Stair Management Technique: One rail Left;Forwards;Step to pattern Number of Stairs: 3 Height of Stairs: 4 Wheelchair Mobility Wheelchair Mobility: Yes Wheelchair Assistance: 5: Supervision Wheelchair Assistance Details: Clinical cytogeneticist for Engineer, drilling: Left upper extremity;Left lower extremity Wheelchair Parts Management: Supervision/cueing (Subtle cueing required for management of L arm/leg rests.) Distance: 200  Trunk/Postural Assessment : Cervical Assessment Cervical Assessment: Exceptions to [facility] Cervical Strength Overall Cervical Strength Comments: Tendency toward cervical flexion Thoracic Assessment Thoracic Assessment: Within Functional Limits Lumbar Assessment Lumbar Assessment: Within Functional Limits Postural Control Postural Control: Deficits on evaluation Trunk Control: Trunk lateral flexion to L of midline in sitting; able to consistently correct with subtle demonstration cueing.  Balance: Balance Balance  Assessed: Yes Static Sitting Balance Static Sitting - Balance Support: Feet supported Static Sitting - Level of Assistance: 5: Stand by assistance;6: Modified independent (Device/Increase time) Static Sitting - Comment/# of Minutes: Requires LUE support, SBA for static sitting >3 minutes due to decreased muscular endurance of postural control muscles. Dynamic Sitting Balance Dynamic Sitting - Balance Support: No upper extremity supported;Feet supported Dynamic Sitting - Level of Assistance: 5: Stand by assistance Dynamic Sitting - Balance Activities: Lateral lean/weight shifting;Forward lean/weight shifting;Trunk control activities;Reaching for objects Dynamic Sitting - Comments: Requires supervision for dynamic balance while managing bilat w/c brakes, leg rests, arm rests. Static Standing Balance Static Standing - Balance Support: Left upper extremity supported Static Standing - Level of Assistance: 5: Stand by assistance Static Standing - Comment/# of Minutes: 3 minutes with LUE at hallway hand rail Dynamic Standing Balance Dynamic Standing - Balance Support: During functional activity;No upper extremity supported Dynamic Standing - Level of Assistance: 4: Min assist;Patient percentage (comment) (>95%) Dynamic Standing - Balance Activities: Forward lean/weight shifting;Lateral lean/weight shifting Dynamic Standing - Comments: Standing at EOB without UE support: pt utilizing LUE to pull up pants; min A for stability/balance.  See FIM for current functional status  Therapy/Group: Individual Therapy  Jalaina Salyers, Lorenda Ishihara 06/28/2013, 7:11 PM

## 2013-06-28 NOTE — Progress Notes (Signed)
Rt visited with pt to see if he wanted to wear cpap tonight.  Pt refused.  This is second day of refusal.  Rt explained he would be charged for equipment if in the room, and he should consider wearing but he opted not to wear again tonight.

## 2013-06-28 NOTE — Plan of Care (Signed)
Problem: RH BOWEL ELIMINATION Goal: RH STG MANAGE BOWEL WITH ASSISTANCE STG Manage Bowel with Mod assist  Incontinent  Problem: RH BLADDER ELIMINATION Goal: RH STG MANAGE BLADDER WITH ASSISTANCE STG Manage Bladder With Mod A  Outcome: Progressing Can be continent during the day per report, but pt is incontinent @ hs and wears a brief and condom cath  Problem: RH SKIN INTEGRITY Goal: RH STG SKIN FREE OF INFECTION/BREAKDOWN Min A  Outcome: Progressing Scabbed area to Rt. Knee covered OTA  Problem: RH SAFETY Goal: RH STG ADHERE TO SAFETY PRECAUTIONS W/ASSISTANCE/DEVICE STG Adhere to Safety Precautions With mod Assistance/Device.  Outcome: Progressing Pt can be impulsive and requires the use of a bed alarm

## 2013-06-28 NOTE — Progress Notes (Signed)
Physical Therapy Note  Patient Details  Name: Adam Callahan MRN: [id number] Date of Birth: 10-May-1961 Today's Date: 06/28/2013  Time: 1100-1125 25 minutes  1:1 no c/o pain.  Stair negotiation for [employer] with max cues for sequencing and safety, +2 assist for safety due to pt impulsive, excited about d/c home tomorrow.  Pt requires max A for stair negotiation with assist to control R LE.  Standing balance with mini squats and quick sit to stands with focus on use of R LE, pt requires tactile cues to wt bear through R LE in sit to stand.  W/c mobility in home environment with supervision, min cues for obstacle negotiation, pt supervision in controlled environment with cues for safety.   [name] 06/28/2013, 11:25 AM

## 2013-06-28 NOTE — Progress Notes (Signed)
52 y.o. right-handed male with history of diabetes mellitus with peripheral neuropathy. Patient was independent prior to admission driving . Admitted 06/06/2013 with acute right-sided weakness and aphasia after being found on the floor by his family. MRI of the brain acute infarct left PCA territory. There is acute infarct involving the left mid and posterior temporal lobe extending into the left occipital lobe as well acute infarct present left thalamus and left midbrain, left inferior cerebellum. MRA of the head negative for aneurysm. MRI of the neck with occlusions versus embolus versus dissection of the left posterior cerebral artery and left posterior inferior cerebellar artery compatible with acute infarct. Echocardiogram with ejection fraction of 55% no wall motion abnormalities and grade 1 diastolic dysfunction. Carotid Dopplers with no ICA stenosis. Patient did not receive TPA. Neurology services consulted presently on aspirin and Plavix for CVA prophylaxis as well as subcutaneous Lovenox for DVT prophylaxis. Presently maintained on a dysphagia 1 thin liquid diet. Hemoglobin A1c 10.9   Subjective/Complaints: Headache resolved Slept well. No new issues A 12 point review of systems has been performed and if not noted above is otherwise negative.     Objective: Vital Signs: Blood pressure 139/82, pulse 85, temperature 97.5 F (36.4 C), temperature source Oral, resp. rate 18, height 5\' 10"  (1.778 m), weight 76.658 kg (169 lb), SpO2 97.00%. No results found. Results for orders placed during the hospital encounter of 06/09/13 (from the past 72 hour(s))  GLUCOSE, CAPILLARY     Status: Abnormal   Collection Time    06/25/13  7:20 AM      Result Value Range   Glucose-Capillary 131 (*) 70 - 99 mg/dL  GLUCOSE, CAPILLARY     Status: Abnormal   Collection Time    06/25/13 11:24 AM      Result Value Range   Glucose-Capillary 194 (*) 70 - 99 mg/dL  GLUCOSE, CAPILLARY     Status: None   Collection Time    06/25/13  4:53 PM      Result Value Range   Glucose-Capillary 98  70 - 99 mg/dL   Comment 1 Notify RN    GLUCOSE, CAPILLARY     Status: Abnormal   Collection Time    06/25/13  9:04 PM      Result Value Range   Glucose-Capillary 176 (*) 70 - 99 mg/dL  GLUCOSE, CAPILLARY     Status: Abnormal   Collection Time    06/26/13  7:31 AM      Result Value Range   Glucose-Capillary 141 (*) 70 - 99 mg/dL   Comment 1 Notify RN    GLUCOSE, CAPILLARY     Status: Abnormal   Collection Time    06/26/13 11:54 AM      Result Value Range   Glucose-Capillary 135 (*) 70 - 99 mg/dL   Comment 1 Notify RN    GLUCOSE, CAPILLARY     Status: Abnormal   Collection Time    06/26/13  4:40 PM      Result Value Range   Glucose-Capillary 108 (*) 70 - 99 mg/dL  GLUCOSE, CAPILLARY     Status: Abnormal   Collection Time    06/26/13  8:57 PM      Result Value Range   Glucose-Capillary 156 (*) 70 - 99 mg/dL   Comment 1 Notify RN    GLUCOSE, CAPILLARY     Status: Abnormal   Collection Time    06/27/13  7:11 AM      Result Value  Range   Glucose-Capillary 159 (*) 70 - 99 mg/dL   Comment 1 Notify RN    GLUCOSE, CAPILLARY     Status: Abnormal   Collection Time    06/27/13 11:30 AM      Result Value Range   Glucose-Capillary 165 (*) 70 - 99 mg/dL   Comment 1 Notify RN    GLUCOSE, CAPILLARY     Status: Abnormal   Collection Time    06/27/13  3:18 PM      Result Value Range   Glucose-Capillary 65 (*) 70 - 99 mg/dL  GLUCOSE, CAPILLARY     Status: Abnormal   Collection Time    06/27/13  3:52 PM      Result Value Range   Glucose-Capillary 107 (*) 70 - 99 mg/dL  GLUCOSE, CAPILLARY     Status: Abnormal   Collection Time    06/27/13  4:29 PM      Result Value Range   Glucose-Capillary 101 (*) 70 - 99 mg/dL   Comment 1 Notify RN    GLUCOSE, CAPILLARY     Status: Abnormal   Collection Time    06/27/13  8:57 PM      Result Value Range   Glucose-Capillary 141 (*) 70 - 99 mg/dL   Comment 1  Notify RN       HEENT: normal Cardio: RRR and no murmur Resp: CTA B/L and unlabored GI: BS positive and NT, non distended Extremity:  Pulses positive and No Edema Skin:   Intact Neuro: Lethargic, Flat and Cranial Nerve Abnormalities decreased accomodation, R 0/5 Delt bi,trace tri, 0/5grip  3 minus R HF, 1/5 KE , ADF trace, Left side 4+/5,  Reduced sensation in RUE and RLE.  Speech with reduced output, strained quality, difficulty following commands Musc/Skel:  Normal, neck mild tenderness on left Gen NAD Mood and affect are stable  Assessment/Plan: 1. 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 Physiatrist is providing close team supervision and 24 hour management of active medical problems listed below. Physiatrist and rehab team continue to assess barriers to discharge/monitor patient progress toward functional and medical goals. Complete fam training in preparation for D.c. In am FIM: FIM - Bathing Bathing Steps Patient Completed: Chest;Right Arm;Abdomen;Front perineal area;Right upper leg;Left upper leg;Left lower leg (including foot);Right lower leg (including foot) Bathing: 4: Min-Patient completes 8-9 75f 10 parts or 75+ percent  FIM - Upper Body Dressing/Undressing Upper body dressing/undressing steps patient completed: Thread/unthread left sleeve of pullover shirt/dress;Pull shirt over trunk;Put head through opening of pull over shirt/dress Upper body dressing/undressing: 4: Min-Patient completed 75 plus % of tasks FIM - Lower Body Dressing/Undressing Lower body dressing/undressing steps patient completed: Thread/unthread left pants leg;Thread/unthread right pants leg;Don/Doff right sock;Thread/unthread left underwear leg;Thread/unthread right underwear leg;Fasten/unfasten right shoe;Fasten/unfasten  left shoe;Don/Doff left shoe Lower body dressing/undressing: 3: Mod-Patient completed 50-74% of tasks  FIM - Toileting Toileting steps completed by patient: Adjust clothing prior to toileting Toileting Assistive Devices: Grab bar or rail for support Toileting: 2: Max-Patient completed 1 of 3 steps  FIM - Diplomatic Services operational officer Devices: Grab bars Toilet Transfers: 4-From toilet/BSC: Min A (steadying Pt. > 75%);4-To toilet/BSC: Min A (steadying Pt. > 75%)  FIM - Bed/Chair Transfer Bed/Chair Transfer Assistive Devices: Bed rails;Arm rests Bed/Chair Transfer: 4: Bed > Chair or W/C: Min A (steadying Pt. > 75%);4: Chair  or W/C > Bed: Min A (steadying Pt. > 75%);5: Sit > Supine: Supervision (verbal cues/safety issues)  FIM - Locomotion: Wheelchair Distance: 150 Locomotion: Wheelchair: 5: Travels 150 ft or more: maneuvers on rugs and over door sills with supervision, cueing or coaxing FIM - Locomotion: Ambulation Locomotion: Ambulation Assistive Devices: Other (comment) (L hand rail (hallway)) Ambulation/Gait Assistance: 2: Max assist Locomotion: Ambulation: 1: Travels less than 50 ft with maximal assistance (Pt: 25 - 49%)  Comprehension Comprehension Mode: Auditory Comprehension: 3-Understands basic 50 - 74% of the time/requires cueing 25 - 50%  of the time  Expression Expression Mode: Verbal Expression Assistive Devices: 6-Other (Comment) (dysarthic and aphaisic) Expression: 4-Expresses basic 75 - 89% of the time/requires cueing 10 - 24% of the time. Needs helper to occlude trach/needs to repeat words.  Social Interaction Social Interaction: 3-Interacts appropriately 50 - 74% of the time - May be physically or verbally inappropriate.  Problem Solving Problem Solving: 3-Solves basic 50 - 74% of the time/requires cueing 25 - 49% of the time  Memory Memory: 3-Recognizes or recalls 50 - 74% of the time/requires cueing 25 - 49% of the time Medical Problem List and  Plan:  1. Embolic left PCA infarct, right hemiparesis, right hemisensory deficits, right homonymous hemianopsia  2. DVT Prophylaxis/Anticoagulation: Subcutaneous Lovenox. Monitor platelet counts and any signs of bleeding  3. Pain Management:  Left neck and occipital pain recurrent  -scheduled 500mg  tid tylenol  -continue with  kpad and sportscreme  -continue tramadol for more significant pain (i stopped hydrocodone today) 4. Neuropsych: This patient is not capable of making decisions on his own behalf.  5. Dysphagia/aphasia. Dysphagia 1 and liquids. Monitor for any signs of aspiration. Followup speech therapy.  6. Diabetes mellitus with peripheral neuropathy, uncontrolled. Hemoglobin A1c 10.9. Lantus insulin titrated with improved control  -ssi covg ac and hs  -dietary education 7. Hyperlipidemia. Lipitor  8. Emotional lability after stroke improving on sertraline 9.  Constipation , adjusted bowel program 10. OSA- cpap LOS (Days) 19 A FACE TO FACE EVALUATION WAS PERFORMED  Adam Callahan E 06/28/2013, 7:04 AM

## 2013-06-28 NOTE — Progress Notes (Signed)
Speech Language Pathology Discharge Summary & Final Treatment Note  Patient Details  Name: Adam Callahan MRN: [id number] Date of Birth: 1960-10-22  Today's Date: 06/28/2013 Time: 1015-1100 Time Calculation (min): 45 min  Skilled Therapeutic Intervention: Treatment focused on education with patient and close family friend, who will be his caregiver during the day while his aunt is working. SLP facilitated session with education regarding current status in regards to swallowing, speech/language, and cognition. Pt consumed the remainder of his breakfast meal consisting of Dys 2 textures and thin liquids with no overt s/s of aspiration with Min cues. SLP demonstrated cueing, and then caregiver returned demonstration. Pt sustained his attention to breakfast meal for ~20 min without redirective cues. He followed two-step commands with Min cues, however required Max cues for three-step directions. Pt exhibited conversational speech with caregiver, discussing a mutual friend. He exhibited intelligible although dysarthric speech without cues, and appropriately responded to conversation, demonstrating adequate comprehension. Caregiver demonstrated her understanding of cueing with SLP instruction and feedback.  Patient has met 7 of 9 long term goals.  Patient to discharge at overall Mod level.  Reasons goals not met: Pt continues to need Max cues for orientation x4 as well as confrontational naming.   Clinical Impression/Discharge Summary: Pt has met 7 out of 9 LTGs during this admission with functional gains noted in swallowing, speech, language and cognition. Pt is consuming Dys 2 textures and thin liquids with Min cues for utilization of safe swallowing strategies. His expressive and receptive language have improved, and pt is able to communicate his wants/needs with Mod A. He continues to require Max cues for confrontational naming, however is able to describe the object and its function with Mod I. Pt has  made improvements with sustained attention, two-step problems, and intellectual awareness. Education has been completed with the patient, his aunt, and her Goddaughter, and pt will receive 24/7 supervision from these two caregivers. He is scheduled to discharge home 12/9, and it is recommended that he continue to receive [facility] SLP services to maximize safe swallowing, communication, and functional independence.   Care Partner:  Caregiver Able to Provide Assistance: Yes  Type of Caregiver Assistance: Cognitive  Recommendation:  Home Health SLP;24 hour supervision/assistance  Rationale for SLP Follow Up: Maximize cognitive function and independence;Maximize functional communication;Maximize swallowing safety;Reduce caregiver burden   Equipment:   None recommended by SLP  Reasons for discharge: Discharged from hospital   Patient/Family Agrees with Progress Made and Goals Achieved: Yes   See FIM for current functional status   [name], M.A. CCC-SLP [phone]   [name] 06/28/2013, 3:18 PM

## 2013-06-28 NOTE — Progress Notes (Signed)
Occupational Therapy Session Note  Patient Details  Name: Adam Callahan MRN: [id number] Date of Birth: [birth year]  Today's Date: 06/28/2013 Time: 0900-1010 Time Calculation (min): 70 min  Short Term Goals: Week 3:  OT Short Term Goal 1 (Week 3): Focus on LTGs  Skilled Therapeutic Interventions/Progress Updates:  Patient received from PT.  Patient stating that he had not had breakfast and wanted to eat before OT session.  Provided set up/supervision for small bites and sips as well as check right cheek for pocketing and reduce speed of intake.  Patient initially declined to take a shower and get dressed stating that staff bathed & dressed him earlier.  Reviewed with patient that therapy includes patient learning to perform self care tasks instead of staff performing them and patient reluctantly agreed to shower and dress.  Focused session on postural control in standing, awareness of RUE, activity tolerance, and functional transfers.  Pt completed bathing in shower with increased awareness of RUE during task as he was initiating proper positioning throughout bathing however decreased carryover during dressing and sit<>stand. Required mod cues for positioning of BLE during transfers. Pt recalled hemi dressing techniques and required assist for crossover with LLE when threading. Pt able to bring LLE over RLE however unable to sustain position without assist.  This session was to include to complete scheduled family education with pt's aunt who will be assisting with bathing at home however she did not attend. At end of session, patient's niece arrived stating that her mom (patient's aunt) would not be coming in for education since she has been in nursing for 30 years and knows how to care for patients.  Therapy Documentation Precautions:  Precautions Precautions: Fall Precaution Comments: right inattention Restrictions Weight Bearing Restrictions: No Pain: Denies pain ADL: See FIM for  current functional status  Therapy/Group: Individual Therapy  Adam Callahan 06/28/2013, 11:26 AM

## 2013-06-28 NOTE — Progress Notes (Signed)
Physical Therapy Discharge Summary  Patient Details  Name: Adam Callahan MRN: [id number] Date of Birth: 1961-01-27  Today's Date: 06/28/2013 Time: 0800 - 0900   60 min   Patient has met 7 of 7 long term goals due to improved activity tolerance, improved balance, improved postural control, increased strength, increased range of motion, ability to compensate for deficits, functional use of  right upper extremity and right lower extremity, improved attention and improved awareness.  Patient to discharge at a wheelchair level Min Assist.   Patient's care partner is independent to provide the necessary physical assistance at discharge, with exception of negotiation of 2 stairs with wheelchair. Patient's family friend, who was present for 2 hands-on training sessions, was educated on the following: 1) that it would only be safe for patient to negotiate 2 steps to enter home with +2 Total Assist in wheelchair; 2) that two family members must be present at PT session for hands-on training of appropriate assistance technique. Family friend was given paper handout explaining technique at initial training session. Family friend verbalized understanding but did not give effective return demonstration, as no additional person was present to learn technique during second training session.  Reasons goals not met: All goals met.  Recommendation:  Patient will benefit from ongoing skilled PT services in home health setting to continue to advance safe functional mobility, address ongoing impairments in R hemiplegia, R-sided inattention, postural instability, limited safety awareness, balance impairments, decreased stability/independence with functional mobility, and minimize fall risk.  Equipment: standard wheelchair  Reasons for discharge: treatment goals met and discharge from hospital  Patient/family agrees with progress made and goals achieved: Yes  PT Discharge Precautions/Restrictions   Fall; R-sided  inattention Vital Signs Therapy Vitals Temp: 97.7 F (36.5 C) Temp src: Oral Pulse Rate: 94 Resp: 17 BP: 134/81 mmHg Patient Position, if appropriate: Lying Oxygen Therapy SpO2: 100 % O2 Device: None (Room air) Pain  Pt reports no pain during PT session. Sensation Sensation Light Touch: Impaired by gross assessment Light Touch Impaired Details: Impaired RLE Proprioception: Impaired by gross assessment Proprioception Impaired Details: Impaired RLE Coordination Gross Motor Movements are Fluid and Coordinated: No Motor  Motor Motor: Hemiplegia;Motor perseverations;Abnormal postural alignment and control  Mobility Bed Mobility Bed Mobility: Rolling Right;Rolling Left;Sit to Supine;Sitting - Scoot to Edge of Bed;Supine to Sit;Scooting to [facility] Rolling Right: 5: Supervision Rolling Right Details (indicate cue type and reason): Demonstration, gesturing required to initiate rolling with consistent effective return demonstration.  Rolling Left: 5: Supervision Rolling Left Details (indicate cue type and reason):  Supervision required due to decreased pt awareness of proximity to EOB on L side. Supine to Sit: 5: Supervision;4: Min assist Supine to Sit Details (indicate cue type and reason): Ranges from supervision to minA depending on level of pt fatigue, abdominal pain Sitting - Scoot to Edge of Bed: 5: Supervision Sitting - Scoot to [employer] of Bed Details: Visual cues/gestures for precautions/safety Sitting - Scoot to [employer] of Bed Details (indicate cue type and reason): Supervision due to decreased trunk control (tendency toward anterior trunk lean). Sit to Supine: 5: Supervision Sit to Supine - Details (indicate cue type and reason): Performs sit>supine wth supervision with gesturing cues to remind pt of technique to elevate RLE using LLE.  Scooting to [facility]: 5: Supervision Scooting to [facility] Details (indicate cue type and reason): Gesturing for hand placement at bed  rails Transfers Transfers: Yes Sit to Stand: 5: Supervision;With armrests;From bed;From chair/3-in-1 Stand to Sit: 4: Min guard;4: Min assist  Stand to Sit Details (indicate cue type and reason): Manual facilitation for placement Stand Pivot Transfers: 4: Min assist;3: Mod assist Squat Pivot Transfers: 4: Min assist Locomotion  Ambulation Ambulation: Yes Ambulation/Gait Assistance: 2: Max assist Ambulation Distance (Feet): 30 Feet Assistive device: Other (Comment) (L hallway hand rail) Ambulation/Gait Assistance Details: Verbal cues for precautions/safety;Manual facilitation for weight shifting;Tactile cues for posture Ambulation/Gait Assistance Details: Gait x30' with LUE support at hallway hand rail, step-to pattern. Pt required mod A for majority of gait trial, max A to recover from R knee buckle during RLE stance phase. Therapist providing manual facilitation of upright posture at R axilla, L ribcage. Pt with improved RLE placement, improved postural control (increased bilat hip extension throughout gait cycle), and improved RLE weightbearing . Pt continues to exhibit impulsivity, limited cervical/thoracic spine extension during gait.  Gait Gait: Yes Gait Pattern: Impaired Gait Pattern: Step-to pattern;Decreased step length - left;Decreased stance time - right;Decreased dorsiflexion - right;Decreased weight shift to right;Right flexed knee in stance;Narrow base of support;Trunk flexed Stairs / Additional Locomotion Stairs: Yes Stairs Assistance: 1: +2 Total assist Stairs Assistance Details: Tactile cues for sequencing;Tactile cues for placement;Verbal cues for technique;Verbal cues for precautions/safety;Manual facilitation for placement;Manual facilitation for weight shifting Stairs Assistance Details (indicate cue type and reason): Pt requires max A  for stability with stair negotiation, +2A for safety secondary to pt impulsivity Stair Management Technique: One rail Left;Forwards;Step  to pattern Number of Stairs: 3 Height of Stairs: 4 Wheelchair Mobility Wheelchair Mobility: Yes Wheelchair Assistance: 5: Supervision Wheelchair Assistance Details: Clinical cytogeneticist for Engineer, drilling: Left upper extremity;Left lower extremity Wheelchair Parts Management: Supervision/cueing (Subtle cueing required for management of L arm/leg rests.) Distance: 200  Trunk/Postural Assessment  Cervical Assessment Cervical Assessment: Exceptions to [facility] Cervical Strength Overall Cervical Strength Comments: Tendency toward cervical flexion Thoracic Assessment Thoracic Assessment: Within Functional Limits Lumbar Assessment Lumbar Assessment: Within Functional Limits Postural Control Postural Control: Deficits on evaluation Trunk Control: Trunk lateral flexion to L of midline in sitting; able to consistently correct with subtle demonstration cueing.  Balance Balance Balance Assessed: Yes Static Sitting Balance Static Sitting - Balance Support: Feet supported Static Sitting - Level of Assistance: 5: Stand by assistance;6: Modified independent (Device/Increase time) Static Sitting - Comment/# of Minutes: Requires LUE support, SBA for static sitting >3 minutes due to decreased muscular endurance of postural control muscles. Dynamic Sitting Balance Dynamic Sitting - Balance Support: No upper extremity supported;Feet supported Dynamic Sitting - Level of Assistance: 5: Stand by assistance Dynamic Sitting - Balance Activities: Lateral lean/weight shifting;Forward lean/weight shifting;Trunk control activities;Reaching for objects Dynamic Sitting - Comments: Requires supervision for dynamic balance while managing bilat w/c brakes, leg rests, arm rests. Static Standing Balance Static Standing - Balance Support: Left upper extremity supported Static Standing - Level of Assistance: 5: Stand by assistance Static Standing - Comment/# of Minutes: 3 minutes with LUE at  hallway hand rail Dynamic Standing Balance Dynamic Standing - Balance Support: During functional activity;No upper extremity supported Dynamic Standing - Level of Assistance: 4: Min assist;Patient percentage (comment) (>95%) Dynamic Standing - Balance Activities: Forward lean/weight shifting;Lateral lean/weight shifting Dynamic Standing - Comments: Standing at EOB without UE support: pt utilizing LUE to pull up pants; min A for stability/balance. Extremity Assessment  RLE Assessment RLE Assessment: Exceptions to [facility] RLE Strength RLE Overall Strength Comments: 3/5 to 3+/5 R hip flexion/adduction, 2+/5 to 3-/5 R hip extension/abduction. 3-/5 knee extension, 3+/5 knee flexion, 2-/5 ankle plantarflexion. 1/5 subtalar inversion. No palpable dorsiflexion/eversion. LLE Assessment  LLE Assessment: Within Functional Limits  See FIM for current functional status  Ransom Nickson, Lorenda Ishihara 06/28/2013, 6:52 PM

## 2013-06-28 NOTE — Discharge Summary (Signed)
Discharge summary job [id number]

## 2013-06-28 NOTE — Progress Notes (Signed)
Social Work [name], male   DOB: March 19, 1961, 52 y.o.   MRN: [id number] Spoke with Aunt who reports she feels confident regarding pt's care and felt family education was not needed.  God daughter feels she has been here and  Seen him in therapies.  Unsure if she is comfortable with toileting him since her Mom will be bathing and dressing him but may be at work when he needs to be toileted. She is aware he needs assistance this.  They feel they can manage pt and his care needs at  home and will have home health begin and transition to OP therapies.

## 2013-06-29 DIAGNOSIS — I10 Essential (primary) hypertension: Secondary | ICD-10-CM

## 2013-06-29 DIAGNOSIS — I69991 Dysphagia following unspecified cerebrovascular disease: Secondary | ICD-10-CM

## 2013-06-29 DIAGNOSIS — G8110 Spastic hemiplegia affecting unspecified side: Secondary | ICD-10-CM

## 2013-06-29 DIAGNOSIS — I69921 Dysphasia following unspecified cerebrovascular disease: Secondary | ICD-10-CM

## 2013-06-29 DIAGNOSIS — I6330 Cerebral infarction due to thrombosis of unspecified cerebral artery: Secondary | ICD-10-CM

## 2013-06-29 MED ORDER — CLOPIDOGREL BISULFATE 75 MG PO TABS: 75.0000 mg | ORAL_TABLET | Freq: Every day | ORAL | Status: DC

## 2013-06-29 MED ORDER — INSULIN ASPART 100 UNIT/ML ~~LOC~~ SOLN: 6.0000 [IU] | Freq: Three times a day (TID) | SUBCUTANEOUS | Status: DC

## 2013-06-29 MED ORDER — INSULIN GLARGINE 100 UNIT/ML ~~LOC~~ SOLN: 25.0000 [IU] | Freq: Every day | SUBCUTANEOUS | Status: DC

## 2013-06-29 MED ORDER — ASPIRIN 81 MG PO CHEW: 81.0000 mg | CHEWABLE_TABLET | Freq: Every day | ORAL | Status: DC

## 2013-06-29 MED ORDER — TRAMADOL HCL 50 MG PO TABS: 50.0000 mg | ORAL_TABLET | Freq: Four times a day (QID) | ORAL | Status: DC | PRN

## 2013-06-29 MED ORDER — SERTRALINE HCL 50 MG PO TABS: 50.0000 mg | ORAL_TABLET | Freq: Every day | ORAL | Status: DC

## 2013-06-29 MED ORDER — ATORVASTATIN CALCIUM 20 MG PO TABS: 20.0000 mg | ORAL_TABLET | Freq: Every day | ORAL | Status: DC

## 2013-06-29 MED ORDER — ASPIRIN EC 81 MG PO TBEC
81.0000 mg | DELAYED_RELEASE_TABLET | Freq: Every day | ORAL | Status: DC
  Filled 2013-06-29 (×2): qty 1

## 2013-06-29 MED ORDER — ADULT MULTIVITAMIN W/MINERALS CH: 1.0000 | ORAL_TABLET | Freq: Every day | ORAL | Status: DC

## 2013-06-29 NOTE — Progress Notes (Signed)
Social Work [name], male   DOB: 11/19/1960, 52 y.o.   MRN: [id number] Adam Callahan to get PCP name at home and follow up with appointment.  She had forgotten to bring the name to this worker but she reports she will follow up with this.

## 2013-06-29 NOTE — Progress Notes (Signed)
Social Work Discharge Note Discharge Note  The overall goal for the admission was met for:   Discharge location: Yes-HOME WITH AUNT TRIBBY- WITH 24 HR CARE  Length of Stay: Yes-20 DAYS  Discharge activity level: Yes-MIN ASSIST WHEELCHAIR LEVEL  Home/community participation: Yes  Services provided included: MD, RD, PT, OT, SLP, RN, TR, Pharmacy, Neuropsych and SW  Financial Services: Private Insurance: BCBS  Follow-up services arranged: Home Health: [facility]-PT,OT,SPT,RN, DME: [facility]-WHEELCHAIR, TUB [facility]EN[facility], BSC and Patient/Family request agency HH: [facility]-AUNT WORKS FOR THEM, DME: [facility]  Comments (or additional information):FAMILY EDUCATION DECLINED BY FAMILY FELT WITH AUNT'S EXPERIENCE AS A CNA SHE CAN DO PT'S CARE.  GOD DAUGHTER ALSO DECLINED. CONCERN IS FAMILY DOES NOT REALIZE HOW MUCH CARE PT IS AND HOW COGNITIVELY IMPAIRED HE IS.  FAMILY FELT HE WAS COMPLETELY COGNITIVELY INTACT. AWARE HE REQUIRES 24 HOUR PHYSICAL CARE AND CONFIRMS HE HAS THIS AT HOME.  AUNT WORKING ON [employer] AND WILL PURSUE THIS   Patient/Family verbalized understanding of follow-up arrangements: Yes  Individual responsible for coordination of the follow-up plan: TRIBBY-AUNT & RHONISHA-AUNT'S GODDAGUTHER  Confirmed correct DME delivered: Lucy Chris 06/29/2013    Lucy Chris

## 2013-06-29 NOTE — Progress Notes (Signed)
Occupational Therapy Discharge Summary  Patient Details  Name: Adam Callahan MRN: [id number] Date of Birth: 23-Sep-1960  Today's Date: 06/29/2013    Patient has met 11 of 11 long term goals due to improved activity tolerance, improved balance, postural control, ability to compensate for deficits, improved attention, improved awareness and improved coordination.  Patient to discharge at [facility] Assist level and mod assist toileting.  Patient's care partner is independent to provide the necessary physical and cognitive assistance at discharge. Patient's caregiver's are his aunt and his aunt's goddaughter. The goddaughter participated in family education during therapy sessions and demonstrated ability to safely assist with transfers, toileting task, dressing, and standing balance activities. Educated on positioning of LUE while in supine and she demonstrated good carryover with this.Patient's aunt will be assisting with bathing. The aunt did not attend scheduled family education time and reported to SW that she felt confident in her ability to provide assist secondary to skills as a Engineer, civil (consulting).     Reasons goals not met: N/A. All LTGs met.   Recommendation:  Patient will benefit from ongoing skilled OT services in outpatient setting to continue to advance functional skills in the area of BADL.  Equipment: BSC and TTB  Reasons for discharge: treatment goals met and discharge from hospital  Patient/family agrees with progress made and goals achieved: Yes  OT Discharge Precautions/Restrictions  Precautions Precautions: Fall Restrictions Weight Bearing Restrictions: No General   Vital Signs   Pain   ADL   Vision/Perception  Vision - History Baseline Vision: No visual deficits Patient Visual Report: No change from baseline Vision - Assessment Eye Alignment: Within Functional Limits  Cognition Overall Cognitive Status: Impaired/Different from baseline Arousal/Alertness:  Awake/alert Orientation Level: Oriented to person;Disoriented to place;Disoriented to time;Disoriented to situation Attention: Sustained;Selective Selective Attention: Impaired Memory: Impaired Awareness: Impaired Problem Solving: Impaired Safety/Judgment: Impaired Sensation Sensation Light Touch: Appears Intact Hot/Cold: Appears Intact Motor    Mobility     Trunk/Postural Assessment     Balance   Extremity/Trunk Assessment RUE Assessment RUE Assessment: Exceptions to [facility] RUE Strength RUE Overall Strength Comments: active movement at shoulder adduction and flex/ext. No pain with full AROM LUE Assessment LUE Assessment: Within Functional Limits  See FIM for current functional status  Daneil Dan 06/29/2013, 12:31 PM

## 2013-06-29 NOTE — Discharge Summary (Signed)
NAME:  Adam Callahan, Adam Callahan NO.:  [id number]  MEDICAL RECORD NO.:  [id number]  LOCATION:  [id number]                        FACILITY:  [facility]  PHYSICIAN:  Erick Colace, M.D.DATE OF BIRTH:  1961/06/06  DATE OF ADMISSION:  06/09/2013 DATE OF DISCHARGE:  06/29/2013                              DISCHARGE SUMMARY   DISCHARGE DIAGNOSES: 1. Embolic left posterior communicating artery infarction. 2. Subcutaneous Lovenox for deep vein thrombosis prophylaxis. 3. 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 4. Diabetes mellitus. 5. Peripheral neuropathy. 6. Hyperlipidemia. 7. Constipation. 8. Obstructive sleep apnea.  HISTORY OF PRESENT ILLNESS:  This is a 52 year old right-handed male with history of diabetes mellitus who was independent prior to admission and driving.  Admitted June 06, 2013, with acute right-sided weakness and aphasia, after being found on the floor by his family.  MRI of the brain showed acute infarct left PCA territory.  There was acute infarct involving the left mid posterior temporal lobe extending into the left occipital lobe as well as acute infarct present left thalamus and left mid brain.  MRA of the head negative for aneurysm.  MRI of the neck with occlusions versus embolus versus dissection of the left posterior cerebral artery and left posterior inferior cerebellar artery compatible with acute infarction.  Echocardiogram with ejection fraction 55%.  No wall motion abnormalities and grade 1 diastolic dysfunction. Carotid Dopplers with no ICA stenosis.  The patient did not receive tPA. Neurology Service was consulted, maintained on aspirin and Plavix therapy.  Subcutaneous Lovenox added for DVT prophylaxis.  Maintained on a dysphagia 1 thin liquid diet.  Physical and occupational and speech therapy ongoing.  The patient was admitted for a comprehensive rehab program.  PAST MEDICAL HISTORY:  See discharge  diagnoses.  SOCIAL HISTORY:  Lives alone.  FUNCTIONAL HISTORY:  Prior to admission independent.  FUNCTIONAL STATUS:  Upon admission to rehab services +2 total assist for mobility.  PHYSICAL EXAMINATION:  VITAL SIGNS:  Blood pressure 144/70, pulse 73, temperature 97.5, respirations 20. GENERAL:  This was an alert male, made good eye contact with examiner. He was oriented to name only.  He followed basic commands. HEENT:  Pupils were round and reactive to light. LUNGS:  Clear to auscultation. CARDIAC:  Regular rate and rhythm. ABDOMEN:  Soft, nontender.  Good bowel sounds.  REHABILITATION HOSPITAL COURSE:  The patient was admitted to Inpatient Rehab Services with therapies initiated on a 3-hour daily basis consisting of physical therapy, occupational therapy, speech therapy, and rehabilitation nursing.  The following issues were addressed during the patient's rehabilitation stay.  Pertaining to Mr. Molinelli' embolic left PCA infarction remained stable  continue aspirin/ Plavix therapy. Subcutaneous Lovenox for DVT prophylaxis.  His diet was slowly advanced as per Speech Therapy showing no signs of aspiration with continued follow up as an outpatient.  He did have a history of diabetes mellitus, peripheral neuropathy.  Hemoglobin A1c of 10.9, maintained on Lantus insulin with full diabetic teaching.  Lipitor for hyperlipidemia.  He did have some emotional lability secondary to a stroke, maintained on Zoloft 50 mg daily with emotional support provided.  The patient received weekly collaborative interdisciplinary team conferences to discuss estimated length of stay, family  teaching, and any barriers to his discharge.  He was supine to sit with supervision, minimal assist for supine to sit.  Moderate assist for bathing and lower body dressing, minimal assist functional transfers, minimal assist upper body dressing, transfers minimum to moderate assist ambulation with +2 total assist. He  was able to communicate basic needs but unable to repeat at all word levels with max cuing receptively identifying __________ 3.  Full family teaching was completed and plan was to be discharged to home.  DISCHARGE MEDICATIONS: 1. Lipitor 20 mg p.o. daily. 2. Plavix 75 mg p.o. daily. 3. Lantus insulin 25 units subcutaneous daily. 4. Multivitamin 1 tab daily. 5. Zoloft 50 mg p.o. daily. 6. Ultram 50 mg p.o. every 6 hours as needed moderate pain. 7. Aspirin 81 mg daily DIET:  Dysphagia II thin liquid, diabetic restriction advanced as per Speech Therapy.  The patient would follow up with Dr. Claudette Laws at the outpatient rehab center on August 02, 2013, Dr. Delia Heady, 1 month call for appointment.  Medical management to be addressed and arranged as per Child psychotherapist.  Ongoing therapies were arranged.     Mariam Dollar, P.A.   ______________________________ Erick Colace, M.D.    DA/MEDQ  D:  06/28/2013  T:  06/29/2013  Job:  [id number]  cc:   Pramod P. Pearlean Brownie, MD

## 2013-06-29 NOTE — Progress Notes (Signed)
Pt. Got d/c order,follow up instructions.Pt. Ready to go home with family.

## 2013-06-29 NOTE — Progress Notes (Signed)
52 y.o. right-handed male with history of diabetes mellitus with peripheral neuropathy. Patient was independent prior to admission driving . Admitted 06/06/2013 with acute right-sided weakness and aphasia after being found on the floor by his family. MRI of the brain acute infarct left PCA territory. There is acute infarct involving the left mid and posterior temporal lobe extending into the left occipital lobe as well acute infarct present left thalamus and left midbrain, left inferior cerebellum. MRA of the head negative for aneurysm. MRI of the neck with occlusions versus embolus versus dissection of the left posterior cerebral artery and left posterior inferior cerebellar artery compatible with acute infarct. Echocardiogram with ejection fraction of 55% no wall motion abnormalities and grade 1 diastolic dysfunction. Carotid Dopplers with no ICA stenosis. Patient did not receive TPA. Neurology services consulted presently on aspirin and Plavix for CVA prophylaxis as well as subcutaneous Lovenox for DVT prophylaxis. Presently maintained on a dysphagia 1 thin liquid diet. Hemoglobin A1c 10.9   Subjective/Complaints: Headache resolved Slept well. No new issues. Ready to go home A 12 point review of systems -aphasia     Objective: Vital Signs: Blood pressure 156/92, pulse 91, temperature 97.8 F (36.6 C), temperature source Oral, resp. rate 18, height 5\' 10"  (1.778 m), weight 76.658 kg (169 lb), SpO2 99.00%. No results found. Results for orders placed during the hospital encounter of 06/09/13 (from the past 72 hour(s))  GLUCOSE, CAPILLARY     Status: Abnormal   Collection Time    06/26/13 11:54 AM      Result Value Range   Glucose-Capillary 135 (*) 70 - 99 mg/dL   Comment 1 Notify RN    GLUCOSE, CAPILLARY     Status: Abnormal   Collection Time    06/26/13  4:40 PM      Result Value Range   Glucose-Capillary 108 (*) 70 - 99 mg/dL  GLUCOSE, CAPILLARY     Status: Abnormal   Collection Time    06/26/13  8:57 PM      Result Value Range   Glucose-Capillary 156 (*) 70 - 99 mg/dL   Comment 1 Notify RN    GLUCOSE, CAPILLARY     Status: Abnormal   Collection Time    06/27/13  7:11 AM      Result Value Range   Glucose-Capillary 159 (*) 70 - 99 mg/dL   Comment 1 Notify RN    GLUCOSE, CAPILLARY     Status: Abnormal   Collection Time    06/27/13 11:30 AM      Result Value Range   Glucose-Capillary 165 (*) 70 - 99 mg/dL   Comment 1 Notify RN    GLUCOSE, CAPILLARY     Status: Abnormal   Collection Time    06/27/13  3:18 PM      Result Value Range   Glucose-Capillary 65 (*) 70 - 99 mg/dL  GLUCOSE, CAPILLARY     Status: Abnormal   Collection Time    06/27/13  3:52 PM      Result Value Range   Glucose-Capillary 107 (*) 70 - 99 mg/dL  GLUCOSE, CAPILLARY     Status: Abnormal   Collection Time    06/27/13  4:29 PM      Result Value Range   Glucose-Capillary 101 (*) 70 - 99 mg/dL   Comment 1 Notify RN    GLUCOSE, CAPILLARY     Status: Abnormal   Collection Time    06/27/13  8:57 PM  Result Value Range   Glucose-Capillary 141 (*) 70 - 99 mg/dL   Comment 1 Notify RN    GLUCOSE, CAPILLARY     Status: Abnormal   Collection Time    06/28/13  7:31 AM      Result Value Range   Glucose-Capillary 112 (*) 70 - 99 mg/dL  GLUCOSE, CAPILLARY     Status: Abnormal   Collection Time    06/28/13 11:46 AM      Result Value Range   Glucose-Capillary 174 (*) 70 - 99 mg/dL  GLUCOSE, CAPILLARY     Status: Abnormal   Collection Time    06/28/13  4:41 PM      Result Value Range   Glucose-Capillary 225 (*) 70 - 99 mg/dL  GLUCOSE, CAPILLARY     Status: None   Collection Time    06/28/13  8:53 PM      Result Value Range   Glucose-Capillary 99  70 - 99 mg/dL     HEENT: normal Cardio: RRR and no murmur Resp: CTA B/L and unlabored GI: BS positive and NT, non distended Extremity:  Pulses positive and No Edema Skin:   Intact Neuro: Lethargic, Flat and Cranial Nerve Abnormalities  decreased accomodation, R 0/5 Delt bi,trace tri, 0/5grip  3 minus R HF, 1/5 KE , ADF trace, Left side 4+/5,  Reduced sensation in RUE and RLE.  Speech with reduced output, strained quality, difficulty following commands Musc/Skel:  Normal,  Gen NAD Mood and affect are stable  Assessment/Plan: 1. 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 Physiatrist is providing close team supervision and 24 hour management of active medical problems listed below. Physiatrist and rehab team continue to assess barriers to discharge/monitor patient progress toward functional and medical goals. Stable for D/C today F/u PCP in 1-2 weeks F/u PM&R 3 weeks F/u neuro 1-2 mo See D/C summary See D/C instructions FIM: FIM - Bathing Bathing Steps Patient Completed: Chest;Right Arm;Abdomen;Front perineal area;Right upper leg;Left upper leg;Left lower leg (including foot);Right lower leg (including foot) Bathing: 4: Min-Patient completes 8-9 48f 10 parts or 75+ percent  FIM - Upper Body Dressing/Undressing Upper body dressing/undressing steps patient completed: Thread/unthread left sleeve of pullover shirt/dress;Pull shirt over trunk;Put head through opening of pull over shirt/dress Upper body dressing/undressing: 4: Min-Patient completed 75 plus % of tasks FIM - Lower Body Dressing/Undressing Lower body dressing/undressing steps patient completed: Thread/unthread left pants leg;Thread/unthread right pants leg;Don/Doff right sock;Thread/unthread left underwear leg;Fasten/unfasten right shoe;Fasten/unfasten left shoe;Don/Doff left shoe;Pull pants up/down;Don/Doff left sock;Don/Doff right shoe Lower body dressing/undressing: 4: Min-Patient completed 75 plus % of tasks  FIM - Toileting Toileting steps completed by patient: Adjust clothing prior  to toileting;Adjust clothing after toileting Toileting Assistive Devices: Grab bar or rail for support Toileting: 3: Mod-Patient completed 2 of 3 steps  FIM - Diplomatic Services operational officer Devices: Grab bars Toilet Transfers: 4-To toilet/BSC: Min A (steadying Pt. > 75%);4-From toilet/BSC: Min A (steadying Pt. > 75%)  FIM - Bed/Chair Transfer Bed/Chair Transfer Assistive Devices: Bed rails;Arm rests Bed/Chair Transfer: 4: Bed > Chair or W/C: Min A (steadying Pt. > 75%);4: Chair or W/C > Bed: Min A (steadying Pt. > 75%);5: Supine > Sit: Supervision (verbal cues/safety issues)  FIM - Locomotion: Wheelchair Distance: 200 Locomotion: Wheelchair: 5: Travels 150 ft or more: maneuvers on rugs and over door sills with supervision,  cueing or coaxing FIM - Locomotion: Ambulation Locomotion: Ambulation Assistive Devices: Other (comment) (L hallway hand rail) Ambulation/Gait Assistance: 2: Max assist Locomotion: Ambulation: 1: Travels less than 50 ft with maximal assistance (Pt: 25 - 49%)  Comprehension Comprehension Mode: Auditory Comprehension: 4-Understands basic 75 - 89% of the time/requires cueing 10 - 24% of the time  Expression Expression Mode: Verbal Expression Assistive Devices: 6-Other (Comment) (dysarthic and aphaisic) Expression: 4-Expresses basic 75 - 89% of the time/requires cueing 10 - 24% of the time. Needs helper to occlude trach/needs to repeat words.  Social Interaction Social Interaction: 4-Interacts appropriately 75 - 89% of the time - Needs redirection for appropriate language or to initiate interaction.  Problem Solving Problem Solving: 3-Solves basic 50 - 74% of the time/requires cueing 25 - 49% of the time  Memory Memory: 3-Recognizes or recalls 50 - 74% of the time/requires cueing 25 - 49% of the time Medical Problem List and Plan:  1. Embolic left PCA infarct, right hemiparesis, right hemisensory deficits, right homonymous hemianopsia  2. DVT  Prophylaxis/Anticoagulation: Subcutaneous Lovenox. Monitor platelet counts and any signs of bleeding  3. Pain Management:  Left neck and occipital pain recurrent  -scheduled 500mg  tid tylenol  -continue with  kpad and sportscreme  -continue tramadol for more significant pain (i stopped hydrocodone today) 4. Neuropsych: This patient is not capable of making decisions on his own behalf.  5. Dysphagia/aphasia. Dysphagia 1 and liquids. Monitor for any signs of aspiration. Followup speech therapy.  6. Diabetes mellitus with peripheral neuropathy, uncontrolled. Hemoglobin A1c 10.9. Lantus insulin titrated with improved control   7. Hyperlipidemia. Lipitor  8. Emotional lability after stroke improving on sertraline cont 6 mo 9.  Constipation , adjusted bowel program 10. OSA- cpap LOS (Days) 20 A FACE TO FACE EVALUATION WAS PERFORMED  Kathleen Tamm E 06/29/2013, 7:43 AM

## 2013-07-07 ENCOUNTER — Telehealth: Payer: Self-pay

## 2013-07-07 NOTE — Telephone Encounter (Signed)
Revonda Standard with [facility] called regarding patients blood pressure.  It was 155/110.  Advised her to have patient go to ER.  He does not have a PCP and is not on any blood pressure medication.  Also gave her Harvel Ricks pager number for further orders.

## 2013-07-13 ENCOUNTER — Telehealth: Payer: Self-pay

## 2013-07-13 NOTE — Telephone Encounter (Signed)
Allison-Speech Therapist from [facility] is requesting to extend patient's speech therapy. Is this okay?

## 2013-07-14 NOTE — Telephone Encounter (Signed)
Ok to extend ST 

## 2013-07-16 NOTE — Telephone Encounter (Signed)
Left message for Revonda Standard that is it ok to extend speech therapy for patient.

## 2013-07-19 ENCOUNTER — Telehealth: Payer: Self-pay

## 2013-07-19 NOTE — Telephone Encounter (Signed)
Onalee Hua PT with [facility] called to get verbal to extend patients care.  Left message Verbal given.

## 2013-08-02 ENCOUNTER — Ambulatory Visit (HOSPITAL_BASED_OUTPATIENT_CLINIC_OR_DEPARTMENT_OTHER): Payer: BC Managed Care – PPO | Admitting: Physical Medicine & Rehabilitation

## 2013-08-02 ENCOUNTER — Encounter: Payer: BC Managed Care – PPO | Attending: Physical Medicine & Rehabilitation

## 2013-08-02 ENCOUNTER — Encounter: Payer: Self-pay | Admitting: Physical Medicine & Rehabilitation

## 2013-08-02 VITALS — BP 118/69 | HR 84 | Resp 14 | Ht 65.0 in | Wt 160.0 lb

## 2013-08-02 DIAGNOSIS — I69320 Aphasia following cerebral infarction: Secondary | ICD-10-CM

## 2013-08-02 DIAGNOSIS — I6340 Cerebral infarction due to embolism of unspecified cerebral artery: Secondary | ICD-10-CM

## 2013-08-02 DIAGNOSIS — G8110 Spastic hemiplegia affecting unspecified side: Secondary | ICD-10-CM

## 2013-08-02 DIAGNOSIS — I69920 Aphasia following unspecified cerebrovascular disease: Secondary | ICD-10-CM

## 2013-08-02 DIAGNOSIS — I69959 Hemiplegia and hemiparesis following unspecified cerebrovascular disease affecting unspecified side: Secondary | ICD-10-CM | POA: Insufficient documentation

## 2013-08-02 DIAGNOSIS — E119 Type 2 diabetes mellitus without complications: Secondary | ICD-10-CM | POA: Insufficient documentation

## 2013-08-02 NOTE — Progress Notes (Signed)
   Subjective:    [name], male    DOB: 07-Sep-1960, 53 y.o.   MRN: [id number]  HPI L PCA infarct 06/06/2013 DATE OF ADMISSION: 06/09/2013  DATE OF DISCHARGE: 06/29/2013  Completed home health therapy. Still needs assistance with ambulation as well as dressing and bathing.  Home health therapist request right AFO as well as a right hemiwalker  Had problems with elevated BP , PCP Dr Lynden Oxford started new BP meds  Right LE spasms sometimes extension sometimes flexion Pain Inventory Average Pain 0 Pain Right Now 0 My pain is n/  In the last 24 hours, has pain interfered with the following? General activity 0 Relation with others 0 Enjoyment of life 0 What TIME of day is your pain at its worst? n/a Sleep (in general) Good  Pain is worse with: n/a Pain improves with: n/a Relief from Meds: n/a  Mobility do you drive?  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  no   Family History  Problem Relation Age of Onset  . Cervical cancer Mother   . Hypertension Sister   . Diabetes Mellitus II Maternal Uncle    History   Social History  . Marital Status: Single    Spouse Name: N/A    Number of Children: N/A  . Years of Education: N/A   Social History Main Topics  . Smoking status: Former Research scientist (life sciences)  . Smokeless tobacco: None  . Alcohol Use: No  . Drug Use: No  . Sexual Activity: None   Other Topics Concern  . None   Social History Narrative  . None   History reviewed. No pertinent past surgical history. Past Medical History  Diagnosis Date  . Diabetes mellitus    BP 118/69  Pulse 84  Resp 14  Ht 5\' 5"  (1.651 m)  Wt 160 lb (72.576 kg)   BMI 26.63 kg/m2  SpO2 99%     Review of Systems  Musculoskeletal: Positive for gait problem.  All other systems reviewed and are negative.       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 Will switch to outpatient PT and OT as well a speech therapy Will write order for right hemiwalker Will write order for right AFO RTC 1 mo

## 2013-08-03 DIAGNOSIS — R269 Unspecified abnormalities of gait and mobility: Secondary | ICD-10-CM | POA: Diagnosis not present

## 2013-08-03 DIAGNOSIS — I69998 Other sequelae following unspecified cerebrovascular disease: Secondary | ICD-10-CM | POA: Diagnosis not present

## 2013-08-03 DIAGNOSIS — I69920 Aphasia following unspecified cerebrovascular disease: Secondary | ICD-10-CM | POA: Diagnosis not present

## 2013-08-03 DIAGNOSIS — M6281 Muscle weakness (generalized): Secondary | ICD-10-CM | POA: Diagnosis not present

## 2013-08-10 ENCOUNTER — Encounter: Payer: Self-pay | Admitting: Physical Medicine & Rehabilitation

## 2013-08-24 ENCOUNTER — Ambulatory Visit: Payer: Worker's Compensation

## 2013-08-24 ENCOUNTER — Ambulatory Visit: Payer: Worker's Compensation | Attending: Physical Medicine & Rehabilitation | Admitting: Occupational Therapy

## 2013-08-24 DIAGNOSIS — R41841 Cognitive communication deficit: Secondary | ICD-10-CM | POA: Insufficient documentation

## 2013-08-24 DIAGNOSIS — Z5189 Encounter for other specified aftercare: Secondary | ICD-10-CM | POA: Diagnosis present

## 2013-08-24 DIAGNOSIS — I69922 Dysarthria following unspecified cerebrovascular disease: Secondary | ICD-10-CM | POA: Diagnosis not present

## 2013-08-26 ENCOUNTER — Ambulatory Visit: Payer: Worker's Compensation | Admitting: Physical Therapy

## 2013-08-26 DIAGNOSIS — R41841 Cognitive communication deficit: Secondary | ICD-10-CM | POA: Diagnosis not present

## 2013-08-30 ENCOUNTER — Telehealth: Payer: Self-pay | Admitting: Physical Medicine & Rehabilitation

## 2013-08-30 ENCOUNTER — Telehealth: Payer: Self-pay

## 2013-08-30 ENCOUNTER — Encounter: Payer: Self-pay | Admitting: Physical Medicine & Rehabilitation

## 2013-08-30 NOTE — Telephone Encounter (Signed)
FYI: Pt's wife called to cnc pt's appointment on 02.10.2015 lack of having co pay; she states they are still working with worker's comp to help cover and they are not trying to help... Pt got hurt on the job and then had a stroke; but he does not have the 70 dollars to pay; he is reschedule to come in on 03.13.2015 at 245 pm... Pt's wife states she will call later on today to discuss about some medications.Marland KitchenMarland Kitchen

## 2013-08-30 NOTE — Telephone Encounter (Signed)
Patient called requesting plavix and lipitor refill.  Advised patient to contact PCP to get refills on these medications.  He understands.

## 2013-08-30 NOTE — Telephone Encounter (Signed)
noted 

## 2013-08-31 ENCOUNTER — Ambulatory Visit: Payer: BC Managed Care – PPO | Admitting: Physical Medicine & Rehabilitation

## 2013-09-01 ENCOUNTER — Ambulatory Visit: Payer: Worker's Compensation | Admitting: Occupational Therapy

## 2013-09-01 ENCOUNTER — Ambulatory Visit: Payer: Worker's Compensation

## 2013-09-01 DIAGNOSIS — R41841 Cognitive communication deficit: Secondary | ICD-10-CM | POA: Diagnosis not present

## 2013-09-02 ENCOUNTER — Ambulatory Visit: Payer: Worker's Compensation

## 2013-09-02 ENCOUNTER — Ambulatory Visit: Payer: Worker's Compensation | Admitting: Occupational Therapy

## 2013-09-02 DIAGNOSIS — R41841 Cognitive communication deficit: Secondary | ICD-10-CM | POA: Diagnosis not present

## 2013-09-03 ENCOUNTER — Ambulatory Visit: Payer: Worker's Compensation | Admitting: Physical Therapy

## 2013-09-03 DIAGNOSIS — R41841 Cognitive communication deficit: Secondary | ICD-10-CM | POA: Diagnosis not present

## 2013-09-06 ENCOUNTER — Ambulatory Visit: Payer: Worker's Compensation | Admitting: Occupational Therapy

## 2013-09-06 ENCOUNTER — Ambulatory Visit: Payer: Worker's Compensation

## 2013-09-06 DIAGNOSIS — R41841 Cognitive communication deficit: Secondary | ICD-10-CM | POA: Diagnosis not present

## 2013-09-08 ENCOUNTER — Ambulatory Visit: Payer: Worker's Compensation

## 2013-09-08 ENCOUNTER — Ambulatory Visit: Payer: Worker's Compensation | Admitting: Occupational Therapy

## 2013-09-08 ENCOUNTER — Ambulatory Visit: Payer: Worker's Compensation | Admitting: Physical Therapy

## 2013-09-10 ENCOUNTER — Ambulatory Visit: Payer: Worker's Compensation | Admitting: Occupational Therapy

## 2013-09-10 ENCOUNTER — Ambulatory Visit: Payer: Worker's Compensation | Admitting: Physical Therapy

## 2013-09-10 DIAGNOSIS — R41841 Cognitive communication deficit: Secondary | ICD-10-CM | POA: Diagnosis not present

## 2013-09-13 ENCOUNTER — Ambulatory Visit: Payer: Worker's Compensation | Admitting: *Deleted

## 2013-09-13 ENCOUNTER — Ambulatory Visit: Payer: Worker's Compensation

## 2013-09-13 DIAGNOSIS — R41841 Cognitive communication deficit: Secondary | ICD-10-CM | POA: Diagnosis not present

## 2013-09-15 ENCOUNTER — Ambulatory Visit: Payer: Worker's Compensation | Admitting: Occupational Therapy

## 2013-09-15 ENCOUNTER — Ambulatory Visit: Payer: Worker's Compensation

## 2013-09-15 ENCOUNTER — Ambulatory Visit: Payer: Self-pay

## 2013-09-16 ENCOUNTER — Ambulatory Visit: Payer: Worker's Compensation | Admitting: Speech Pathology

## 2013-09-16 ENCOUNTER — Ambulatory Visit: Payer: Self-pay

## 2013-09-16 ENCOUNTER — Ambulatory Visit: Payer: Worker's Compensation | Admitting: Occupational Therapy

## 2013-09-17 ENCOUNTER — Ambulatory Visit: Payer: Worker's Compensation | Admitting: *Deleted

## 2013-09-17 ENCOUNTER — Ambulatory Visit: Payer: Self-pay | Admitting: Physical Therapy

## 2013-09-20 ENCOUNTER — Ambulatory Visit: Payer: BC Managed Care – PPO | Admitting: Occupational Therapy

## 2013-09-20 ENCOUNTER — Ambulatory Visit: Payer: BC Managed Care – PPO

## 2013-09-20 ENCOUNTER — Ambulatory Visit: Payer: BC Managed Care – PPO | Attending: Physical Medicine & Rehabilitation | Admitting: Physical Therapy

## 2013-09-20 DIAGNOSIS — I69922 Dysarthria following unspecified cerebrovascular disease: Secondary | ICD-10-CM | POA: Insufficient documentation

## 2013-09-20 DIAGNOSIS — R41841 Cognitive communication deficit: Secondary | ICD-10-CM | POA: Insufficient documentation

## 2013-09-21 ENCOUNTER — Ambulatory Visit: Payer: Self-pay | Admitting: Neurology

## 2013-09-22 ENCOUNTER — Ambulatory Visit: Payer: BC Managed Care – PPO | Admitting: Occupational Therapy

## 2013-09-22 ENCOUNTER — Ambulatory Visit: Payer: BC Managed Care – PPO | Admitting: Physical Therapy

## 2013-09-24 ENCOUNTER — Ambulatory Visit: Payer: BC Managed Care – PPO

## 2013-09-24 ENCOUNTER — Ambulatory Visit: Payer: BC Managed Care – PPO | Admitting: Physical Therapy

## 2013-09-24 ENCOUNTER — Ambulatory Visit: Payer: BC Managed Care – PPO | Admitting: Occupational Therapy

## 2013-09-27 ENCOUNTER — Ambulatory Visit: Payer: BC Managed Care – PPO | Admitting: Physical Therapy

## 2013-09-27 ENCOUNTER — Ambulatory Visit: Payer: BC Managed Care – PPO | Admitting: *Deleted

## 2013-09-29 ENCOUNTER — Ambulatory Visit: Payer: BC Managed Care – PPO | Admitting: Occupational Therapy

## 2013-09-29 ENCOUNTER — Ambulatory Visit: Payer: BC Managed Care – PPO | Admitting: Speech Pathology

## 2013-09-29 ENCOUNTER — Ambulatory Visit: Payer: BC Managed Care – PPO

## 2013-10-01 ENCOUNTER — Ambulatory Visit (HOSPITAL_BASED_OUTPATIENT_CLINIC_OR_DEPARTMENT_OTHER): Payer: BC Managed Care – PPO | Admitting: Physical Medicine & Rehabilitation

## 2013-10-01 ENCOUNTER — Encounter: Payer: Worker's Compensation | Attending: Physical Medicine & Rehabilitation

## 2013-10-01 ENCOUNTER — Encounter: Payer: Self-pay | Admitting: Physical Medicine & Rehabilitation

## 2013-10-01 VITALS — BP 140/82 | HR 86 | Resp 14 | Ht 65.0 in | Wt 160.0 lb

## 2013-10-01 DIAGNOSIS — R209 Unspecified disturbances of skin sensation: Secondary | ICD-10-CM

## 2013-10-01 DIAGNOSIS — I69320 Aphasia following cerebral infarction: Secondary | ICD-10-CM | POA: Insufficient documentation

## 2013-10-01 DIAGNOSIS — I69998 Other sequelae following unspecified cerebrovascular disease: Secondary | ICD-10-CM

## 2013-10-01 DIAGNOSIS — E119 Type 2 diabetes mellitus without complications: Secondary | ICD-10-CM | POA: Insufficient documentation

## 2013-10-01 DIAGNOSIS — I69920 Aphasia following unspecified cerebrovascular disease: Secondary | ICD-10-CM

## 2013-10-01 DIAGNOSIS — I69959 Hemiplegia and hemiparesis following unspecified cerebrovascular disease affecting unspecified side: Secondary | ICD-10-CM | POA: Insufficient documentation

## 2013-10-01 DIAGNOSIS — G8110 Spastic hemiplegia affecting unspecified side: Secondary | ICD-10-CM

## 2013-10-01 NOTE — Progress Notes (Signed)
   Subjective:    [name], male    DOB: [birth year], 53 y.o.   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 Still needs assistance with ambulation as well as dressing and bathing.  Notices difficulties opening the right hand after he makes fist  Undergoing outpatient PT OT and speech therapy Right LE spasms sometimes extension sometimes flexion  Pain Inventory Average Pain 0 Pain Right Now 0 My pain is n/a  In the last 24 hours, has pain interfered with the following? General activity 0 Relation with others 0 Enjoyment of life 0 What TIME of day is your pain at its worst? night Sleep (in general) Fair  Pain is worse with: n/a Pain improves with: rest Relief from Meds: 5  Mobility use a wheelchair  Function disabled: date disabled .  Neuro/Psych trouble walking  Prior Studies Any changes since last visit?  no  Physicians involved in your care Any changes since last visit?  no   Family History  Problem Relation Age of Onset  . Cervical cancer Mother   . Hypertension Sister   . Diabetes Mellitus II Maternal Uncle    History   Social History  . Marital Status: Single    Spouse Name: N/A    Number of Children: N/A  . Years of Education: N/A   Social History Main Topics  . Smoking status: Former Research scientist (life sciences)  . Smokeless tobacco: None  . Alcohol Use: No  . Drug Use: No  . Sexual Activity: None   Other Topics Concern  . None   Social History Narrative  . None   History reviewed. No pertinent past surgical history. Past Medical History  Diagnosis Date  . Diabetes mellitus    BP 140/82  Pulse 86  Resp 14  Ht 5\' 5"  (1.651 m)  Wt 160 lb (72.576 kg)  BMI 26.63 kg/m2  SpO2 98%  Opioid Risk Score:   Fall Risk Score: Moderate Fall Risk (6-13 points) (patient educated handout declined)   Review of Systems  Musculoskeletal: Positive for  gait problem.  All other systems reviewed and are negative.       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

## 2013-10-01 NOTE — Patient Instructions (Signed)
Continue outpatient rehabilitation Botox injection next visit

## 2013-10-05 ENCOUNTER — Ambulatory Visit: Payer: BC Managed Care – PPO | Admitting: Occupational Therapy

## 2013-10-05 ENCOUNTER — Ambulatory Visit: Payer: BC Managed Care – PPO | Admitting: Speech Pathology

## 2013-10-05 ENCOUNTER — Ambulatory Visit: Payer: BC Managed Care – PPO | Admitting: Physical Therapy

## 2013-10-07 ENCOUNTER — Ambulatory Visit: Payer: BC Managed Care – PPO | Admitting: Occupational Therapy

## 2013-10-07 ENCOUNTER — Ambulatory Visit: Payer: BC Managed Care – PPO | Admitting: Speech Pathology

## 2013-10-07 ENCOUNTER — Ambulatory Visit: Payer: BC Managed Care – PPO | Admitting: Physical Therapy

## 2013-10-12 ENCOUNTER — Ambulatory Visit: Payer: BC Managed Care – PPO | Admitting: Occupational Therapy

## 2013-10-12 ENCOUNTER — Ambulatory Visit: Payer: BC Managed Care – PPO

## 2013-10-14 ENCOUNTER — Ambulatory Visit: Payer: BC Managed Care – PPO | Admitting: Occupational Therapy

## 2013-10-14 ENCOUNTER — Ambulatory Visit: Payer: BC Managed Care – PPO | Admitting: Speech Pathology

## 2013-10-14 ENCOUNTER — Ambulatory Visit: Payer: BC Managed Care – PPO

## 2013-10-19 ENCOUNTER — Encounter: Payer: Self-pay | Admitting: Speech Pathology

## 2013-10-19 ENCOUNTER — Ambulatory Visit: Payer: BC Managed Care – PPO

## 2013-10-19 ENCOUNTER — Ambulatory Visit: Payer: BC Managed Care – PPO | Admitting: Occupational Therapy

## 2013-10-21 ENCOUNTER — Ambulatory Visit: Payer: BC Managed Care – PPO

## 2013-10-21 ENCOUNTER — Ambulatory Visit: Payer: BC Managed Care – PPO | Attending: Physical Medicine & Rehabilitation | Admitting: Occupational Therapy

## 2013-10-21 ENCOUNTER — Encounter: Payer: Self-pay | Admitting: Speech Pathology

## 2013-10-21 DIAGNOSIS — I69922 Dysarthria following unspecified cerebrovascular disease: Secondary | ICD-10-CM | POA: Diagnosis not present

## 2013-10-21 DIAGNOSIS — R41841 Cognitive communication deficit: Secondary | ICD-10-CM | POA: Diagnosis not present

## 2013-10-21 DIAGNOSIS — Z5189 Encounter for other specified aftercare: Secondary | ICD-10-CM | POA: Diagnosis present

## 2013-10-26 ENCOUNTER — Ambulatory Visit: Payer: BC Managed Care – PPO | Admitting: Occupational Therapy

## 2013-10-26 DIAGNOSIS — R41841 Cognitive communication deficit: Secondary | ICD-10-CM | POA: Diagnosis not present

## 2013-11-03 ENCOUNTER — Ambulatory Visit: Payer: BC Managed Care – PPO | Admitting: Occupational Therapy

## 2013-11-05 ENCOUNTER — Ambulatory Visit: Payer: BC Managed Care – PPO | Admitting: Occupational Therapy

## 2013-11-05 DIAGNOSIS — R41841 Cognitive communication deficit: Secondary | ICD-10-CM | POA: Diagnosis not present

## 2013-11-09 ENCOUNTER — Ambulatory Visit: Payer: BC Managed Care – PPO | Admitting: Occupational Therapy

## 2013-11-09 DIAGNOSIS — R41841 Cognitive communication deficit: Secondary | ICD-10-CM | POA: Diagnosis not present

## 2013-11-11 ENCOUNTER — Ambulatory Visit: Payer: BC Managed Care – PPO | Admitting: Occupational Therapy

## 2013-11-11 DIAGNOSIS — R41841 Cognitive communication deficit: Secondary | ICD-10-CM | POA: Diagnosis not present

## 2013-11-15 ENCOUNTER — Ambulatory Visit: Payer: BC Managed Care – PPO | Admitting: Occupational Therapy

## 2013-11-15 DIAGNOSIS — R41841 Cognitive communication deficit: Secondary | ICD-10-CM | POA: Diagnosis not present

## 2013-11-18 ENCOUNTER — Encounter: Payer: BC Managed Care – PPO | Admitting: Occupational Therapy

## 2013-11-18 DIAGNOSIS — R41841 Cognitive communication deficit: Secondary | ICD-10-CM | POA: Diagnosis not present

## 2014-01-05 ENCOUNTER — Telehealth: Payer: Self-pay | Admitting: Nurse Practitioner

## 2014-01-05 ENCOUNTER — Ambulatory Visit: Payer: Self-pay | Admitting: Nurse Practitioner

## 2014-01-05 NOTE — Telephone Encounter (Signed)
Patient was no show for today's office appointment.  

## 2014-03-29 ENCOUNTER — Telehealth: Payer: Self-pay | Admitting: *Deleted

## 2014-03-29 NOTE — Telephone Encounter (Signed)
Tammy from [place] called to reach Dr Letta Pate for "[employer]ablo Ledger" to schedule a teleconference on Mr Marrow.  They can be reached at [phone] ext [phone]

## 2014-03-29 NOTE — Telephone Encounter (Signed)
Left message for Adam Callahan to call us back and give [place] more information on what this phone call was concerning before I pass the message along to Dr Letta Pate.

## 2014-04-27 ENCOUNTER — Encounter (HOSPITAL_COMMUNITY): Payer: Self-pay | Admitting: Emergency Medicine

## 2014-04-27 ENCOUNTER — Emergency Department (HOSPITAL_COMMUNITY)
Admission: EM | Admit: 2014-04-27 | Discharge: 2014-04-28 | Disposition: A | Discharge status: Home / Self Care | Payer: Worker's Compensation | Attending: Emergency Medicine | Admitting: Emergency Medicine

## 2014-04-27 ENCOUNTER — Emergency Department (HOSPITAL_COMMUNITY): Payer: Worker's Compensation

## 2014-04-27 DIAGNOSIS — Z7982 Long term (current) use of aspirin: Secondary | ICD-10-CM | POA: Insufficient documentation

## 2014-04-27 DIAGNOSIS — R739 Hyperglycemia, unspecified: Secondary | ICD-10-CM

## 2014-04-27 DIAGNOSIS — Z7902 Long term (current) use of antithrombotics/antiplatelets: Secondary | ICD-10-CM | POA: Insufficient documentation

## 2014-04-27 DIAGNOSIS — E1165 Type 2 diabetes mellitus with hyperglycemia: Secondary | ICD-10-CM | POA: Insufficient documentation

## 2014-04-27 DIAGNOSIS — R51 Headache: Secondary | ICD-10-CM | POA: Insufficient documentation

## 2014-04-27 DIAGNOSIS — H538 Other visual disturbances: Secondary | ICD-10-CM | POA: Insufficient documentation

## 2014-04-27 DIAGNOSIS — Z794 Long term (current) use of insulin: Secondary | ICD-10-CM | POA: Insufficient documentation

## 2014-04-27 DIAGNOSIS — Z8673 Personal history of transient ischemic attack (TIA), and cerebral infarction without residual deficits: Secondary | ICD-10-CM | POA: Insufficient documentation

## 2014-04-27 DIAGNOSIS — H539 Unspecified visual disturbance: Secondary | ICD-10-CM

## 2014-04-27 DIAGNOSIS — Z87891 Personal history of nicotine dependence: Secondary | ICD-10-CM | POA: Insufficient documentation

## 2014-04-27 DIAGNOSIS — R000 Tachycardia, unspecified: Secondary | ICD-10-CM | POA: Insufficient documentation

## 2014-04-27 LAB — COMPREHENSIVE METABOLIC PANEL
ALBUMIN: 3.7 g/dL (ref 3.5–5.2)
ALK PHOS: 98 U/L (ref 39–117)
ALT: 20 U/L (ref 0–53)
AST: 12 U/L (ref 0–37)
Anion gap: 13 (ref 5–15)
BILIRUBIN TOTAL: 0.5 mg/dL (ref 0.3–1.2)
BUN: 7 mg/dL (ref 6–23)
CHLORIDE: 97 meq/L (ref 96–112)
CO2: 26 meq/L (ref 19–32)
Calcium: 9 mg/dL (ref 8.4–10.5)
Creatinine, Ser: 0.6 mg/dL (ref 0.50–1.35)
GFR calc non Af Amer: >90 mL/min (ref >90)
GLUCOSE: 350 mg/dL — AB (ref 70–99)
POTASSIUM: 3.9 meq/L (ref 3.7–5.3)
Sodium: 136 mEq/L — ABNORMAL LOW (ref 137–147)
Total Protein: 7.7 g/dL (ref 6.0–8.3)

## 2014-04-27 LAB — CBC WITH DIFFERENTIAL/PLATELET
Basophils Absolute: 0 10*3/uL (ref 0.0–0.1)
Basophils Relative: 1 % (ref 0–1)
Eosinophils Absolute: 0.1 10*3/uL (ref 0.0–0.7)
Eosinophils Relative: 2 % (ref 0–5)
HEMATOCRIT: 39.4 % (ref 39.0–52.0)
HEMOGLOBIN: 13.6 g/dL (ref 13.0–17.0)
LYMPHS ABS: 1.7 10*3/uL (ref 0.7–4.0)
Lymphocytes Relative: 32 % (ref 12–46)
MCH: 28.5 pg (ref 26.0–34.0)
MCHC: 34.5 g/dL (ref 30.0–36.0)
MCV: 82.4 fL (ref 78.0–100.0)
MONOS PCT: 9 % (ref 3–12)
Monocytes Absolute: 0.5 10*3/uL (ref 0.1–1.0)
NEUTROS ABS: 2.9 10*3/uL (ref 1.7–7.7)
Neutrophils Relative %: 56 % (ref 43–77)
Platelets: 255 10*3/uL (ref 150–400)
RBC: 4.78 MIL/uL (ref 4.22–5.81)
RDW: 12.2 % (ref 11.5–15.5)
WBC: 5.2 10*3/uL (ref 4.0–10.5)

## 2014-04-27 LAB — RAPID URINE DRUG SCREEN, HOSP PERFORMED
Amphetamines: NOT DETECTED
Barbiturates: NOT DETECTED
Benzodiazepines: NOT DETECTED
COCAINE: NOT DETECTED
Opiates: NOT DETECTED
TETRAHYDROCANNABINOL: NOT DETECTED

## 2014-04-27 LAB — URINALYSIS, ROUTINE W REFLEX MICROSCOPIC
Bilirubin Urine: NEGATIVE
Glucose, UA: >1000 mg/dL — AB
Hgb urine dipstick: NEGATIVE
Ketones, ur: NEGATIVE mg/dL
Leukocytes, UA: NEGATIVE
Nitrite: NEGATIVE
Protein, ur: NEGATIVE mg/dL
SPECIFIC GRAVITY, URINE: 1.033 — AB (ref 1.005–1.030)
UROBILINOGEN UA: 0.2 mg/dL (ref 0.0–1.0)
pH: 7 (ref 5.0–8.0)

## 2014-04-27 LAB — I-STAT TROPONIN, ED: Troponin i, poc: 0 ng/mL (ref 0.00–0.08)

## 2014-04-27 LAB — CBG MONITORING, ED
GLUCOSE-CAPILLARY: 348 mg/dL — AB (ref 70–99)
GLUCOSE-CAPILLARY: 334 mg/dL — AB (ref 70–99)
Glucose-Capillary: 302 mg/dL — ABNORMAL HIGH (ref 70–99)
Glucose-Capillary: 305 mg/dL — ABNORMAL HIGH (ref 70–99)

## 2014-04-27 LAB — ETHANOL: Alcohol, Ethyl (B): <11 mg/dL (ref 0–11)

## 2014-04-27 LAB — URINE MICROSCOPIC-ADD ON

## 2014-04-27 LAB — APTT: aPTT: 27 seconds (ref 24–37)

## 2014-04-27 LAB — PROTIME-INR
INR: 1.04 (ref 0.00–1.49)
PROTHROMBIN TIME: 13.6 s (ref 11.6–15.2)

## 2014-04-27 MED ORDER — SODIUM CHLORIDE 0.9 % IV BOLUS (SEPSIS)
1000.0000 mL | Freq: Once | INTRAVENOUS | Status: AC
  Administered 2014-04-27: 1000 mL via INTRAVENOUS

## 2014-04-27 MED ORDER — INSULIN ASPART 100 UNIT/ML ~~LOC~~ SOLN
7.0000 [IU] | Freq: Once | SUBCUTANEOUS | Status: AC
  Administered 2014-04-28: 7 [IU] via INTRAVENOUS
  Filled 2014-04-27: qty 1

## 2014-04-27 NOTE — ED Notes (Signed)
Pt presents with sudden onset blurred vision to right eye onset 1 hour ago- pt has recent hx of a stroke with right sided deficits.  Pt reports he was lying down when symptoms began.  Pt alert and oriented upon arrival to exam room, answering questions appropriately.

## 2014-04-27 NOTE — ED Provider Notes (Signed)
CSN: [id number]     Arrival date & time 04/27/14  1757 History   First MD Initiated Contact with Patient 04/27/14 1853     Chief Complaint  Patient presents with  . Eye Problem     (Consider location/radiation/quality/duration/timing/severity/associated sxs/prior Treatment) HPI Comments: The patient is a 53 year old male with past medical history of CVA (left cerebellar, thalamus and PCA territory infarcts), diabetes, presenting to emergency room chief complaint of right eye blurred vision since last night. The patient reports right eye blurred vision since approximately 9 PM last night. Upon waking this morning the blurriness has increase.  The history is provided by the patient. No language interpreter was used.    Past Medical History  Diagnosis Date  . Diabetes mellitus    History reviewed. No pertinent past surgical history. Family History  Problem Relation Age of Onset  . Cervical cancer Mother   . Hypertension Sister   . Diabetes Mellitus II Maternal Uncle    History  Substance Use Topics  . Smoking status: Former Research scientist (life sciences)  . Smokeless tobacco: Not on file  . Alcohol Use: No    Review of Systems    Allergies  Review of patient's allergies indicates no known allergies.  Home Medications   Prior to Admission medications   Medication Sig Start Date End Date Taking? Authorizing Provider  aspirin 81 MG chewable tablet Chew 1 tablet (81 mg total) by mouth daily. 06/29/13  Yes Daniel J Angiulli, PA-C  atorvastatin (LIPITOR) 20 MG tablet Take 1 tablet (20 mg total) by mouth daily at 6 PM. 06/29/13  Yes Lavon Paganini Angiulli, PA-C  clopidogrel (PLAVIX) 75 MG tablet Take 1 tablet (75 mg total) by mouth daily with breakfast. 06/29/13  Yes Daniel J Angiulli, PA-C  insulin aspart (NOVOLOG) 100 UNIT/ML injection Inject 6 Units into the skin 3 (three) times daily with meals. 06/29/13  Yes Daniel J Angiulli, PA-C  insulin glargine (LANTUS) 100 UNIT/ML injection Inject 0.25 mLs (25 Units  total) into the skin daily. 06/29/13  Yes Daniel J Angiulli, PA-C  traMADol (ULTRAM) 50 MG tablet Take 50 mg by mouth every 6 (six) hours as needed for moderate pain. 06/29/13  Yes Daniel J Angiulli, PA-C   BP 157/74  Pulse 103  Temp(Src) 98.2 F (36.8 C) (Oral)  Resp 20  SpO2 99% Physical Exam  Nursing note and vitals reviewed. Constitutional: He appears well-developed and well-nourished. No distress.  HENT:  Head: Normocephalic and atraumatic.  Eyes: Pupils are equal, round, and reactive to light.  Neck: Neck supple.  Cardiovascular: Regular rhythm.  Tachycardia present.   Pulmonary/Chest: Effort normal and breath sounds normal. He has no wheezes. He has no rales.  Abdominal: Soft. There is no tenderness. There is no rebound.  Neurological: He is alert. He is disoriented. GCS eye subscore is 4. GCS verbal subscore is 5. GCS motor subscore is 6.  Disoriented to month and year. Speech is clear and goal oriented, follows commands II-Visual fields were normal.   III/IV/VI-Pupils were equal and reacted. Extraocular movements were full and conjugate.  V/VII-Moderate right sided facial droop.  VIII-normal.   Motor: Strength 3/5 upper and lower right extremity. 5/5 left upper and lower extremity strength.  Sensory: normal sensation to upper and lower extremities.  Unable to test pronator drift due to Right sided strength deficits.  Skin: Skin is warm and dry. He is not diaphoretic.  Psychiatric: He has a normal mood and affect. His behavior is normal.    ED Course  Procedures (including critical care time) Labs Review Labs Reviewed  COMPREHENSIVE METABOLIC PANEL - Abnormal; Notable for the following:    Sodium 136 (*)    Glucose, Bld 350 (*)    All other components within normal limits  URINALYSIS, ROUTINE W REFLEX MICROSCOPIC - Abnormal; Notable for the following:    Specific Gravity, Urine 1.033 (*)    Glucose, UA >1000 (*)    All other components within normal limits  CBG  MONITORING, ED - Abnormal; Notable for the following:    Glucose-Capillary 348 (*)    All other components within normal limits  CBG MONITORING, ED - Abnormal; Notable for the following:    Glucose-Capillary 334 (*)    All other components within normal limits  CBG MONITORING, ED - Abnormal; Notable for the following:    Glucose-Capillary 302 (*)    All other components within normal limits  CBG MONITORING, ED - Abnormal; Notable for the following:    Glucose-Capillary 305 (*)    All other components within normal limits  CBG MONITORING, ED - Abnormal; Notable for the following:    Glucose-Capillary 243 (*)    All other components within normal limits  CBC WITH DIFFERENTIAL  ETHANOL  PROTIME-INR  APTT  URINE RAPID DRUG SCREEN (HOSP PERFORMED)  URINE MICROSCOPIC-ADD ON  I-STAT TROPOININ, ED    Imaging Review     CT Head Wo Contrast (Final result)  Result time: 04/27/14 20:14:07    Final result by Rad Results In Interface (04/27/14 20:14:07)    Narrative:   CLINICAL DATA: Onset of blurry vision in the right high at 5 p.m. this same day. Initial encounter.  EXAM: CT HEAD WITHOUT CONTRAST  TECHNIQUE: Contiguous axial images were obtained from the base of the skull through the vertex without intravenous contrast.  COMPARISON: Brain MRI 06/07/2013 and head CT scan 06/06/2013.  FINDINGS: Remote left cerebellar, thalamus and PCA territory infarcts are again seen. No evidence of acute intracranial abnormality including hemorrhage, infarct, mass lesion, mass effect, midline shift or abnormal extra-axial fluid collection is identified. No hydrocephalus or pneumocephalus. The calvarium is intact. Imaged paranasal sinuses and mastoid air cells are clear.  IMPRESSION: No acute finding in patient with remote infarcts as above.   Electronically Signed By: Inge Rise M.D. On: 04/27/2014 20:14          EKG Interpretation None      MDM   Final diagnoses:   Transient visual disturbance, right  Hyperglycemia   Pt with history of CVA with neurologic deficits on exam presents with right sided blurry vision and headache. The patient initial CBG greater than 300, fluids given. CT to rule out intercranial processes. Despite fluids patient's glucose greater than 300. Dr. Rulon Abide also evaluated the patient during this encounter. CT wihtout concerning abnormalitties. IV insulin ordered. Re-eval pt denies current symptoms.  Discussed lab results, imaging results, and treatment plan with the patient. Advised follow up with PCP for further evaluation of elevated glucose system. Return precautions given. Reports understanding and no other concerns at this time.  Patient is stable for discharge at this time.  Meds given in ED:  Medications  sodium chloride 0.9 % bolus 1,000 mL (0 mLs Intravenous Stopped 04/28/14 0003)  sodium chloride 0.9 % bolus 1,000 mL (0 mLs Intravenous Stopped 04/28/14 0003)  insulin aspart (novoLOG) injection 7 Units (7 Units Intravenous Given 04/28/14 0017)    Discharge Medication List as of 04/27/2014 10:45 PM  Harvie Heck, PA-C 05/02/14 [phone]

## 2014-04-27 NOTE — ED Notes (Signed)
PA at bedside.

## 2014-04-27 NOTE — Discharge Instructions (Signed)
Call for a follow up appointment with a Family or Primary Care Provider for further evaluation of your elevated glucose levels. Return if Symptoms worsen.   Take medication as prescribed.

## 2014-04-28 LAB — CBG MONITORING, ED: Glucose-Capillary: 243 mg/dL — ABNORMAL HIGH (ref 70–99)

## 2014-04-28 NOTE — ED Notes (Addendum)
Pt given cab voucher, and [employer] services to take the pt home. Pt waiting at nurse's station until the cab arrives.

## 2014-04-29 ENCOUNTER — Inpatient Hospital Stay (HOSPITAL_COMMUNITY)
Admission: EM | Admit: 2014-04-29 | Discharge: 2014-05-04 | DRG: 065 | Disposition: A | Discharge status: Home / Self Care | Payer: BLUE CROSS/BLUE SHIELD | Attending: Internal Medicine | Admitting: Internal Medicine

## 2014-04-29 ENCOUNTER — Encounter (HOSPITAL_COMMUNITY): Payer: Self-pay | Admitting: Emergency Medicine

## 2014-04-29 ENCOUNTER — Encounter: Payer: BC Managed Care – PPO | Attending: Physical Medicine & Rehabilitation

## 2014-04-29 ENCOUNTER — Ambulatory Visit: Payer: Worker's Compensation | Admitting: Physical Medicine & Rehabilitation

## 2014-04-29 ENCOUNTER — Emergency Department (HOSPITAL_COMMUNITY): Payer: BLUE CROSS/BLUE SHIELD

## 2014-04-29 DIAGNOSIS — Z91120 Patient's intentional underdosing of medication regimen due to financial hardship: Secondary | ICD-10-CM | POA: Diagnosis present

## 2014-04-29 DIAGNOSIS — Z87891 Personal history of nicotine dependence: Secondary | ICD-10-CM

## 2014-04-29 DIAGNOSIS — Z7982 Long term (current) use of aspirin: Secondary | ICD-10-CM

## 2014-04-29 DIAGNOSIS — I639 Cerebral infarction, unspecified: Secondary | ICD-10-CM | POA: Diagnosis present

## 2014-04-29 DIAGNOSIS — E1142 Type 2 diabetes mellitus with diabetic polyneuropathy: Secondary | ICD-10-CM | POA: Diagnosis present

## 2014-04-29 DIAGNOSIS — IMO0002 Reserved for concepts with insufficient information to code with codable children: Secondary | ICD-10-CM | POA: Diagnosis present

## 2014-04-29 DIAGNOSIS — T39016A Underdosing of aspirin, initial encounter: Secondary | ICD-10-CM | POA: Diagnosis present

## 2014-04-29 DIAGNOSIS — Z794 Long term (current) use of insulin: Secondary | ICD-10-CM

## 2014-04-29 DIAGNOSIS — I69320 Aphasia following cerebral infarction: Secondary | ICD-10-CM

## 2014-04-29 DIAGNOSIS — I69322 Dysarthria following cerebral infarction: Secondary | ICD-10-CM

## 2014-04-29 DIAGNOSIS — G8110 Spastic hemiplegia affecting unspecified side: Secondary | ICD-10-CM | POA: Diagnosis present

## 2014-04-29 DIAGNOSIS — Z7902 Long term (current) use of antithrombotics/antiplatelets: Secondary | ICD-10-CM

## 2014-04-29 DIAGNOSIS — E1165 Type 2 diabetes mellitus with hyperglycemia: Secondary | ICD-10-CM | POA: Diagnosis present

## 2014-04-29 DIAGNOSIS — I69351 Hemiplegia and hemiparesis following cerebral infarction affecting right dominant side: Secondary | ICD-10-CM | POA: Diagnosis present

## 2014-04-29 DIAGNOSIS — Z599 Problem related to housing and economic circumstances, unspecified: Secondary | ICD-10-CM

## 2014-04-29 DIAGNOSIS — T383X6A Underdosing of insulin and oral hypoglycemic [antidiabetic] drugs, initial encounter: Secondary | ICD-10-CM | POA: Diagnosis present

## 2014-04-29 DIAGNOSIS — Q211 Atrial septal defect: Secondary | ICD-10-CM

## 2014-04-29 DIAGNOSIS — E785 Hyperlipidemia, unspecified: Secondary | ICD-10-CM | POA: Diagnosis present

## 2014-04-29 DIAGNOSIS — R209 Unspecified disturbances of skin sensation: Secondary | ICD-10-CM

## 2014-04-29 DIAGNOSIS — I6340 Cerebral infarction due to embolism of unspecified cerebral artery: Secondary | ICD-10-CM

## 2014-04-29 DIAGNOSIS — I69998 Other sequelae following unspecified cerebrovascular disease: Secondary | ICD-10-CM

## 2014-04-29 DIAGNOSIS — T45526A Underdosing of antithrombotic drugs, initial encounter: Secondary | ICD-10-CM | POA: Diagnosis present

## 2014-04-29 DIAGNOSIS — Z9119 Patient's noncompliance with other medical treatment and regimen: Secondary | ICD-10-CM | POA: Diagnosis present

## 2014-04-29 HISTORY — DX: Essential (primary) hypertension: I10

## 2014-04-29 LAB — I-STAT CHEM 8, ED
BUN: 4 mg/dL — AB (ref 6–23)
CREATININE: 0.6 mg/dL (ref 0.50–1.35)
Calcium, Ion: 1.14 mmol/L (ref 1.12–1.23)
Chloride: 99 mEq/L (ref 96–112)
GLUCOSE: 250 mg/dL — AB (ref 70–99)
HCT: 45 % (ref 39.0–52.0)
HEMOGLOBIN: 15.3 g/dL (ref 13.0–17.0)
POTASSIUM: 3.6 meq/L — AB (ref 3.7–5.3)
SODIUM: 136 meq/L — AB (ref 137–147)
TCO2: 24 mmol/L (ref 0–100)

## 2014-04-29 MED ORDER — ASPIRIN EC 81 MG PO TBEC
81.0000 mg | DELAYED_RELEASE_TABLET | Freq: Every day | ORAL | Status: DC
  Administered 2014-04-30 – 2014-05-04 (×4): 81 mg via ORAL
  Filled 2014-04-29 (×5): qty 1

## 2014-04-29 NOTE — ED Notes (Signed)
Spoke with MRI technician, MRI to be performed tonight.

## 2014-04-29 NOTE — ED Notes (Signed)
Patient transported to MRI 

## 2014-04-29 NOTE — Consult Note (Signed)
Referring Physician: Dr. Audie Pinto    Chief Complaint: inability to move, HA, blurred vision  HPI:                                                                                                                                         Adam Callahan is an 53 y.o. male with a past medical history significant for DM, PCA and PICA distribution infarcts with residual right hemiparesis and dysarthria, comes i  For further evaluation of the above stated symptoms. A family member is at the bedside and helps patient to gather some of his clinical information as patient seems to be a poor historian. Mr. Mcgrail was seen in the ED couple of days ago with similar complains but they said that " things got worse today" and thus they came to the ED again. He said that he is having trouble getting up and also having some visual changes and right face pain. Denies vertigo, double vision, difficulty swallowing, or worsening slurred speech/right sided weakness. On aspirin and plavix. MRI brain today revealed acute right PCA territory infarct, patchy involvement most pronounced in the right para hippocampal gyrus. Date last known well: uncertain  Time last known well: uncertain tPA Given: no, late presentation   Past Medical History  Diagnosis Date  . Diabetes mellitus     No past surgical history on file.  Family History  Problem Relation Age of Onset  . Cervical cancer Mother   . Hypertension Sister   . Diabetes Mellitus II Maternal Uncle    Social History:  reports that he has quit smoking. He does not have any smokeless tobacco history on file. He reports that he does not drink alcohol or use illicit drugs.  Allergies: No Known Allergies  Medications:                                                                                                                           I have reviewed the patient's current medications.  ROS:  History obtained from the patient and family  General ROS: negative for - chills, fatigue, fever, night sweats, weight gain or weight loss Psychological ROS: negative for - behavioral disorder, hallucinations, mood swings or suicidal ideation Ophthalmic ROS: negative for - double vision, eye pain or loss of vision ENT ROS: negative for - epistaxis, nasal discharge, oral lesions, sore throat, tinnitus or vertigo Allergy and Immunology ROS: negative for - hives or itchy/watery eyes Hematological and Lymphatic ROS: negative for - bleeding problems, bruising or swollen lymph nodes Endocrine ROS: negative for - galactorrhea, hair pattern changes, polydipsia/polyuria or temperature intolerance Respiratory ROS: negative for - cough, hemoptysis, shortness of breath or wheezing Cardiovascular ROS: negative for - chest pain, dyspnea on exertion, edema or irregular heartbeat Gastrointestinal ROS: negative for - abdominal pain, diarrhea, hematemesis, nausea/vomiting or stool incontinence Genito-Urinary ROS: negative for - dysuria, hematuria, incontinence or urinary frequency/urgency Musculoskeletal ROS: negative for - joint swelling Neurological ROS: as noted in HPI Dermatological ROS: negative for rash and skin lesion changes  Physical exam: pleasant male in no apparent distress. Blood pressure 141/83, pulse 94, temperature 98.3 F (36.8 C), temperature source Oral, resp. rate 16, SpO2 94.00%. Head: normocephalic. Neck: supple, no bruits, no JVD. Cardiac: no murmurs. Lungs: clear. Abdomen: soft, no tender, no mass. Extremities: no edema. Neurologic Examination:                                                                                                      General: Mental Status: Alert, oriented, thought content appropriate.  Speech fluent without evidence of aphasia.  Able to follow 3 step commands without difficulty. Cranial  Nerves: II: Discs flat bilaterally; Visual fields grossly normal, pupils equal, round, reactive to light and accommodation III,IV, VI: ptosis not present, extra-ocular motions intact bilaterally V,VII: smile asymmetric due to right face weakness, facial light touch sensation normal bilaterally VIII: hearing normal bilaterally IX,X: gag reflex present XI: bilateral shoulder shrug XII: tongue slightly deviated to the right Motor: Right hemiparesis arm>leg Tone and bulk:normal tone throughout; no atrophy noted Sensory: Pinprick and light touch intact throughout, bilaterally Deep Tendon Reflexes:  Brisker in the right side. Plantars: Right: upgoing   Left: downgoing Cerebellar: normal finger-to-nose,  normal heel-to-shin test Gait:  Unable to test    Results for orders placed during the hospital encounter of 04/29/14 (from the past 48 hour(s))  I-STAT CHEM 8, ED     Status: Abnormal   Collection Time    04/29/14  7:11 PM      Result Value Ref Range   Sodium 136 (*) 137 - 147 mEq/L   Potassium 3.6 (*) 3.7 - 5.3 mEq/L   Chloride 99  96 - 112 mEq/L   BUN 4 (*) 6 - 23 mg/dL   Creatinine, Ser 0.60  0.50 - 1.35 mg/dL   Glucose, Bld 250 (*) 70 - 99 mg/dL   Calcium, Ion 1.14  1.12 - 1.23 mmol/L   TCO2 24  0 - 100 mmol/L   Hemoglobin 15.3  13.0 - 17.0 g/dL   HCT 45.0  39.0 - 52.0 %  Mr Brain Wo Contrast  04/29/2014   CLINICAL DATA:  53 year old male with acute blurred vision and right facial pain. Initial encounter. History of prior strokes, including left PICA infarct in 2014.  EXAM: MRI HEAD WITHOUT CONTRAST  TECHNIQUE: Multiplanar, multiecho pulse sequences of the brain and surrounding structures were obtained without intravenous contrast.  COMPARISON:  Brain MRI 06/07/2013.  FINDINGS: Interval expected evolution of the extensive and partially hemorrhagic left PCA infarct, which included involvement of the left thalamus. Wallerian degeneration. Ex vacuo changes to the left occipital  and temporal horns. Chronic inferior left cerebellar infarct.  Major intracranial vascular flow voids are stable.  Multifocal restricted diffusion in the right PCA territory, including the occipital pole, central occipital white matter, as well as the posterior and mesial right temporal lobe. The right hippocampus is spared. Very hippocampal gyrus is significantly affected. No evidence of associated hemorrhage at this time. Early T2 and FLAIR hyperintensity in the affected areas.  Susceptibility artifact suspected on diffusion weighted imaging in the left PCA territory. No deep gray matter, brainstem, or cerebellar restricted diffusion. No associated mass effect. Elsewhere scattered cerebral white matter T2 and FLAIR hyperintensity is stable to mildly progressed. No ventriculomegaly or midline shift. Negative pituitary, cervicomedullary junction, and upper cervical spine.  Visible internal auditory structures appear normal. Rightward gaze deviation. Visualized orbit soft tissues are within normal limits. Stable paranasal sinuses and mastoids. Stable scalp soft tissues. Normal bone marrow signal.  There is a C4 level disc herniation resulting in degenerative spinal stenosis which appears chronic.  IMPRESSION: 1. Acute right PCA territory infarct, patchy involvement most pronounced in the right para hippocampal gyrus. Mild edema. No associated hemorrhage or mass effect. 2. Extensive chronic left PCA infarct with encephalomalacia and Wallerian degeneration. Chronic left cerebellar infarct. 3. 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   Electronically Signed   By: Lars Pinks M.D.   On: 04/29/2014 21:44    Assessment: 53 y.o. male, poor historian, prior strokes with residual right HP, now with an acute right PCA distribution infarct. Patient was out of the window for thrombolysis. Has been on dual antiplatelet therapy. Recommend admission to medicine, complete stroke work up,  continue current therapy pending results stroke work up. Stroke team will resume care tomorrow.  Stroke Risk Factors - DM, prior stroke  Plan: 1. HgbA1c, fasting lipid panel 2. MRI, MRA  of the brain without contrast 3. Echocardiogram 4. Carotid dopplers 5. Prophylactic therapy-aspirin and plavix after passing swallowing evaluation 6. Risk factor modification 7. Telemetry monitoring 8. Frequent neuro checks 9. PT/OT SLP   Dorian Pod, MD [facility] [phone]  04/29/2014, 11:37 PM

## 2014-04-29 NOTE — ED Notes (Signed)
Audie Pinto, MD at bedside per RN request d/t r/o Code Stroke, MD does not want to call Code Stroke at this time

## 2014-04-29 NOTE — ED Notes (Signed)
Pt returned from MRI °

## 2014-04-29 NOTE — ED Notes (Signed)
Pt in from home via [facility], per report pt was sitting on porch when the pt flagged down a neighbor who called 911 per pt request, pt reports blurred vision & R facial pain, pt seen yesterday for eval of similar symptoms, pt reports R face pain worse x 1 hr today, hx of TBI & CVA per pt report, pt baseline has slurred speech & R facial droop, pt Alert to person & place, disoriented to time, pt confused to onset of symptoms, follows commands

## 2014-04-29 NOTE — ED Provider Notes (Signed)
CSN: [id number]     Arrival date & time 04/29/14  1814 History   First MD Initiated Contact with Patient 04/29/14 1851     Chief Complaint  Patient presents with  . Blurred Vision      HPI  Pt in from home via [facility], per report pt was sitting on porch when the pt flagged down a neighbor who called 911 per pt request, pt reports blurred vision & R facial pain, pt seen yesterday for eval of similar symptoms, pt reports R face pain worse x 1 hr today, hx of TBI & CVA per pt report, pt baseline has slurred speech & R facial droop, pt Alert to person & place, disoriented to time, pt confused to onset of symptoms, follows commands       Past Medical History  Diagnosis Date  . Diabetes mellitus    No past surgical history on file. Family History  Problem Relation Age of Onset  . Cervical cancer Mother   . Hypertension Sister   . Diabetes Mellitus II Maternal Uncle    History  Substance Use Topics  . Smoking status: Former Research scientist (life sciences)  . Smokeless tobacco: Not on file  . Alcohol Use: No    Review of Systems  Unable to perform ROS: Acuity of condition      Allergies  Review of patient's allergies indicates no known allergies.  Home Medications   Prior to Admission medications   Medication Sig Start Date End Date Taking? Authorizing Provider  aspirin 81 MG chewable tablet Chew 1 tablet (81 mg total) by mouth daily. 06/29/13   Lavon Paganini Angiulli, PA-C  atorvastatin (LIPITOR) 20 MG tablet Take 1 tablet (20 mg total) by mouth daily at 6 PM. 06/29/13   Lavon Paganini Angiulli, PA-C  clopidogrel (PLAVIX) 75 MG tablet Take 1 tablet (75 mg total) by mouth daily with breakfast. 06/29/13   Lavon Paganini Angiulli, PA-C  insulin aspart (NOVOLOG) 100 UNIT/ML injection Inject 6 Units into the skin 3 (three) times daily with meals. 06/29/13   Lavon Paganini Angiulli, PA-C  insulin glargine (LANTUS) 100 UNIT/ML injection Inject 0.25 mLs (25 Units total) into the skin daily. 06/29/13   Lavon Paganini Angiulli, PA-C   traMADol (ULTRAM) 50 MG tablet Take 50 mg by mouth every 6 (six) hours as needed for moderate pain. 06/29/13   Daniel J Angiulli, PA-C   BP 141/83  Pulse 94  Temp(Src) 98.3 F (36.8 C) (Oral)  Resp 16  SpO2 94% Physical Exam  Nursing note and vitals reviewed. Constitutional: He is oriented to person, place, and time. He appears well-developed and well-nourished. No distress.  HENT:  Head: Normocephalic and atraumatic.  Eyes: Pupils are equal, round, and reactive to light.  Neck: Normal range of motion.  Cardiovascular: Normal rate and intact distal pulses.   Pulmonary/Chest: No respiratory distress.  Abdominal: Normal appearance. He exhibits no distension.  Musculoskeletal: Normal range of motion.  Neurological: He is alert and oriented to person, place, and time. A cranial nerve deficit is present. He exhibits abnormal muscle tone. He displays no seizure activity. GCS eye subscore is 4. GCS verbal subscore is 5. GCS motor subscore is 6.  Patient appeared to have increasing symptoms of right-sided weakness and pain.  He is unsure when his symptoms started.  He was seen in emergent room 2 days ago for similar complaints and discharged home.  His CT scan at that time showed no significant changes from previous scans.  Skin: Skin is warm  and dry. No rash noted.  Psychiatric: He has a normal mood and affect. His behavior is normal.    ED Course  Procedures (including critical care time)  CRITICAL CARE Performed by: Leonard Schwartz L Total critical care time: 30 min Critical care time was exclusive of separately billable procedures and treating other patients. Critical care was necessary to treat or prevent imminent or life-threatening deterioration. Critical care was time spent personally by me on the following activities: development of treatment plan with patient and/or surrogate as well as nursing, discussions with consultants, evaluation of patient's response to treatment, examination  of patient, obtaining history from patient or surrogate, ordering and performing treatments and interventions, ordering and review of laboratory studies, ordering and review of radiographic studies, pulse oximetry and re-evaluation of patient's condition.  Labs Review Labs Reviewed  I-STAT CHEM 8, ED - Abnormal; Notable for the following:    Sodium 136 (*)    Potassium 3.6 (*)    BUN 4 (*)    Glucose, Bld 250 (*)    All other components within normal limits    Imaging Review Mr Brain Wo Contrast  04/29/2014   CLINICAL DATA:  53 year old male with acute blurred vision and right facial pain. Initial encounter. History of prior strokes, including left PICA infarct in 2014.  EXAM: MRI HEAD WITHOUT CONTRAST  TECHNIQUE: Multiplanar, multiecho pulse sequences of the brain and surrounding structures were obtained without intravenous contrast.  COMPARISON:  Brain MRI 06/07/2013.  FINDINGS: Interval expected evolution of the extensive and partially hemorrhagic left PCA infarct, which included involvement of the left thalamus. Wallerian degeneration. Ex vacuo changes to the left occipital and temporal horns. Chronic inferior left cerebellar infarct.  Major intracranial vascular flow voids are stable.  Multifocal restricted diffusion in the right PCA territory, including the occipital pole, central occipital white matter, as well as the posterior and mesial right temporal lobe. The right hippocampus is spared. Very hippocampal gyrus is significantly affected. No evidence of associated hemorrhage at this time. Early T2 and FLAIR hyperintensity in the affected areas.  Susceptibility artifact suspected on diffusion weighted imaging in the left PCA territory. No deep gray matter, brainstem, or cerebellar restricted diffusion. No associated mass effect. Elsewhere scattered cerebral white matter T2 and FLAIR hyperintensity is stable to mildly progressed. No ventriculomegaly or midline shift. Negative pituitary,  cervicomedullary junction, and upper cervical spine.  Visible internal auditory structures appear normal. Rightward gaze deviation. Visualized orbit soft tissues are within normal limits. Stable paranasal sinuses and mastoids. Stable scalp soft tissues. Normal bone marrow signal.  There is a C4 level disc herniation resulting in degenerative spinal stenosis which appears chronic.   IMPRESSION:  1. Acute right PCA territory infarct, patchy involvement most pronounced in the right para hippocampal gyrus. Mild edema. No associated hemorrhage or mass effect.  2. Extensive chronic left PCA infarct with encephalomalacia and Wallerian degeneration. Chronic left cerebellar infarct.  3. 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    Electronically Signed   By: Lars Pinks M.D.   On: 04/29/2014 21:44     EKG Interpretation   Date/Time:  Friday April 29 2014 19:07:45 EDT Ventricular Rate:  108 PR Interval:  139 QRS Duration: 83 QT Interval:  325 QTC Calculation: 436 R Axis:   48 Text Interpretation:  Sinus tachycardia Borderline repolarization  abnormality No significant change since last tracing Confirmed by Marcoantonio Legault   MD, Tracey Hermance ([id number]) on 04/29/2014 11:15:16 PM      MDM  Final diagnoses:  None        Dot Lanes, MD 04/29/14 2315

## 2014-04-30 ENCOUNTER — Inpatient Hospital Stay (HOSPITAL_COMMUNITY): Payer: BLUE CROSS/BLUE SHIELD

## 2014-04-30 DIAGNOSIS — E785 Hyperlipidemia, unspecified: Secondary | ICD-10-CM

## 2014-04-30 DIAGNOSIS — I639 Cerebral infarction, unspecified: Secondary | ICD-10-CM | POA: Diagnosis present

## 2014-04-30 DIAGNOSIS — E1165 Type 2 diabetes mellitus with hyperglycemia: Secondary | ICD-10-CM

## 2014-04-30 DIAGNOSIS — I517 Cardiomegaly: Secondary | ICD-10-CM

## 2014-04-30 LAB — BASIC METABOLIC PANEL
ANION GAP: 17 — AB (ref 5–15)
BUN: 6 mg/dL (ref 6–23)
CALCIUM: 9.2 mg/dL (ref 8.4–10.5)
CO2: 23 meq/L (ref 19–32)
Chloride: 97 mEq/L (ref 96–112)
Creatinine, Ser: 0.62 mg/dL (ref 0.50–1.35)
GFR calc Af Amer: >90 mL/min (ref >90)
Glucose, Bld: 180 mg/dL — ABNORMAL HIGH (ref 70–99)
Potassium: 3.5 mEq/L — ABNORMAL LOW (ref 3.7–5.3)
SODIUM: 137 meq/L (ref 137–147)

## 2014-04-30 LAB — LIPID PANEL
CHOLESTEROL: 195 mg/dL (ref 0–200)
HDL: 41 mg/dL (ref >39)
LDL Cholesterol: 136 mg/dL — ABNORMAL HIGH (ref 0–99)
Total CHOL/HDL Ratio: 4.8 RATIO
Triglycerides: 88 mg/dL (ref <150)
VLDL: 18 mg/dL (ref 0–40)

## 2014-04-30 LAB — GLUCOSE, CAPILLARY
Glucose-Capillary: 181 mg/dL — ABNORMAL HIGH (ref 70–99)
Glucose-Capillary: 207 mg/dL — ABNORMAL HIGH (ref 70–99)
Glucose-Capillary: 236 mg/dL — ABNORMAL HIGH (ref 70–99)
Glucose-Capillary: 262 mg/dL — ABNORMAL HIGH (ref 70–99)
Glucose-Capillary: 214 mg/dL — ABNORMAL HIGH (ref 70–99)

## 2014-04-30 LAB — HEMOGLOBIN A1C
HEMOGLOBIN A1C: 11.3 % — AB (ref <5.7)
MEAN PLASMA GLUCOSE: 278 mg/dL — AB (ref <117)

## 2014-04-30 LAB — CBC
HCT: 40 % (ref 39.0–52.0)
Hemoglobin: 14.1 g/dL (ref 13.0–17.0)
MCH: 29 pg (ref 26.0–34.0)
MCHC: 35.3 g/dL (ref 30.0–36.0)
MCV: 82.3 fL (ref 78.0–100.0)
PLATELETS: 280 10*3/uL (ref 150–400)
RBC: 4.86 MIL/uL (ref 4.22–5.81)
RDW: 12.3 % (ref 11.5–15.5)
WBC: 5.4 10*3/uL (ref 4.0–10.5)

## 2014-04-30 MED ORDER — INFLUENZA VAC SPLIT QUAD 0.5 ML IM SUSY
0.5000 mL | PREFILLED_SYRINGE | Freq: Once | INTRAMUSCULAR | Status: AC
  Administered 2014-04-30: 0.5 mL via INTRAMUSCULAR
  Filled 2014-04-30: qty 0.5

## 2014-04-30 MED ORDER — ONDANSETRON HCL 4 MG/2ML IJ SOLN: 4.0000 mg | Freq: Three times a day (TID) | INTRAMUSCULAR | Status: AC | PRN

## 2014-04-30 MED ORDER — ATORVASTATIN CALCIUM 20 MG PO TABS
20.0000 mg | ORAL_TABLET | Freq: Every day | ORAL | Status: DC
  Administered 2014-04-30 – 2014-05-03 (×4): 20 mg via ORAL
  Filled 2014-04-30 (×5): qty 1

## 2014-04-30 MED ORDER — STROKE: EARLY STAGES OF RECOVERY BOOK
Freq: Once | Status: AC
  Administered 2014-04-30: 07:00:00
  Filled 2014-04-30: qty 1

## 2014-04-30 MED ORDER — HEPARIN SODIUM (PORCINE) 5000 UNIT/ML IJ SOLN
5000.0000 [IU] | Freq: Three times a day (TID) | INTRAMUSCULAR | Status: DC
  Administered 2014-04-30 – 2014-05-04 (×11): 5000 [IU] via SUBCUTANEOUS
  Filled 2014-04-30 (×18): qty 1

## 2014-04-30 MED ORDER — TRAMADOL HCL 50 MG PO TABS: 50.0000 mg | ORAL_TABLET | Freq: Four times a day (QID) | ORAL | Status: DC | PRN

## 2014-04-30 MED ORDER — CLOPIDOGREL BISULFATE 75 MG PO TABS
75.0000 mg | ORAL_TABLET | Freq: Every day | ORAL | Status: DC
  Administered 2014-04-30 – 2014-05-04 (×5): 75 mg via ORAL
  Filled 2014-04-30 (×4): qty 1

## 2014-04-30 MED ORDER — SENNOSIDES-DOCUSATE SODIUM 8.6-50 MG PO TABS
1.0000 | ORAL_TABLET | Freq: Every evening | ORAL | Status: DC | PRN
  Filled 2014-04-30: qty 1

## 2014-04-30 MED ORDER — INSULIN GLARGINE 100 UNIT/ML ~~LOC~~ SOLN
15.0000 [IU] | Freq: Every day | SUBCUTANEOUS | Status: DC
Start: 2014-04-30 — End: 2014-05-01
  Administered 2014-04-30 – 2014-05-01 (×2): 15 [IU] via SUBCUTANEOUS
  Filled 2014-04-30 (×2): qty 0.15

## 2014-04-30 MED ORDER — INSULIN ASPART 100 UNIT/ML ~~LOC~~ SOLN
0.0000 [IU] | Freq: Three times a day (TID) | SUBCUTANEOUS | Status: DC
  Administered 2014-05-01: 3 [IU] via SUBCUTANEOUS
  Administered 2014-05-01: 1 [IU] via SUBCUTANEOUS
  Administered 2014-05-01 – 2014-05-02 (×2): 3 [IU] via SUBCUTANEOUS
  Administered 2014-05-02 – 2014-05-03 (×3): 2 [IU] via SUBCUTANEOUS
  Administered 2014-05-03 – 2014-05-04 (×3): 1 [IU] via SUBCUTANEOUS
  Administered 2014-05-04: 2 [IU] via SUBCUTANEOUS

## 2014-04-30 NOTE — H&P (Signed)
Adam Callahan History and Physical  [name]:[id number] DOB: 1960/12/30 DOA: 04/29/2014  Referring physician: ED physician PCP: No PCP Per Patient  Specialists:   Chief Complaint: blurry vision and R facial pain  HPI: Adam Callahan is a 53 y.o. 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 He has not been taking his medications for at least several weeks. He developed blurry vision and was evaluated in the ED on 04/27/14. He was discharged home and instructed to continue his medications. Today when he was sitting on porch, he developed similar symptoms again with blurry vision and facial pain. A neighbor called 911 per pt request, and he was brought to ED. Denies vertigo, difficulty swallowing, or worsening slurred speech/right sided weakness. He does not have fever, chills, chest pain, shortness of breath, nausea, vomiting, abdominal pain, dysuria.   MRI showed new acute R PCA infarction.  He is admitted to inpatient for further evaluation and treatment.  Review of Systems: As presented in the history of presenting illness, rest negative.  Where does patient live? Lives alone at home  Can patient participate in ADLs? parically  Allergy: No Known Allergies  Past Medical History  Diagnosis Date  . Diabetes mellitus     No past surgical history on file.  Social History:  reports that he has quit smoking. He does not have any smokeless tobacco history on file. He reports that he does not drink alcohol or use illicit drugs.  Family History:  Family History  Problem Relation Age of Onset  . Cervical cancer Mother   . Hypertension Sister   . Diabetes Mellitus II Maternal Uncle      Prior to Admission medications   Medication Sig Start Date End Date Taking? Authorizing Provider  aspirin 81 MG chewable tablet  Chew 1 tablet (81 mg total) by mouth daily. 06/29/13   Lavon Paganini Angiulli, PA-C  atorvastatin (LIPITOR) 20 MG tablet Take 1 tablet (20 mg total) by mouth daily at 6 PM. 06/29/13   Lavon Paganini Angiulli, PA-C  clopidogrel (PLAVIX) 75 MG tablet Take 1 tablet (75 mg total) by mouth daily with breakfast. 06/29/13   Lavon Paganini Angiulli, PA-C  insulin aspart (NOVOLOG) 100 UNIT/ML injection Inject 6 Units into the skin 3 (three) times daily with meals. 06/29/13   Lavon Paganini Angiulli, PA-C  insulin glargine (LANTUS) 100 UNIT/ML injection Inject 0.25 mLs (25 Units total) into the skin daily. 06/29/13   Lavon Paganini Angiulli, PA-C  traMADol (ULTRAM) 50 MG tablet Take 50 mg by mouth every 6 (six) hours as needed for moderate pain. 06/29/13   Cathlyn Parsons, PA-C    Physical Exam: Filed Vitals:   04/29/14 2315 04/29/14 2345 04/30/14 0000 04/30/14 0006  BP: 141/82 129/96 136/80 151/100  Pulse: 95   92  Temp:    98.8 F (37.1 C)  TempSrc:    Oral  Resp: 17 15 18 12   SpO2: 97%   98%   General: Not in acute distress HEENT:       Eyes: PERRL, EOMI, no scleral icterus       ENT: No discharge from the ears and nose, no pharynx injection, no tonsillar enlargement.        Neck: No JVD, no bruit, no mass felt. Cardiac: S1/S2, RRR, No murmurs, gallops or rubs Pulm: Good air movement bilaterally.  Clear to auscultation bilaterally. No rales, wheezing, rhonchi or rubs. Abd: Soft, nondistended, nontender, no rebound pain, no organomegaly, BS present Ext: No edema. 2+DP/PT pulse bilaterally Musculoskeletal: No joint deformities, erythema, or stiffness, ROM full Skin: No rashes.  Psych: Patient is not psychotic, no suicidal or hemocidal ideation. Neuro: Alert and oriented X3, Right face weakness, facial light touch sensation normal bilaterally. Muscle strength is 1/5 in R leg and 4/5 in r arm. Brachial reflex 2+ bilaterally. Knee reflex 3+ on the Right and 2 on the left. Positive Babinski's sign on right side. Normal  finger-to-nose  Labs on Admission:  Basic Metabolic Panel:  Recent Labs Lab 04/27/14 1857 04/29/14 1911  NA 136* 136*  K 3.9 3.6*  CL 97 99  CO2 26  --   GLUCOSE 350* 250*  BUN 7 4*  CREATININE 0.60 0.60  CALCIUM 9.0  --    Liver Function Tests:  Recent Labs Lab 04/27/14 1857  AST 12  ALT 20  ALKPHOS 98  BILITOT 0.5  PROT 7.7  ALBUMIN 3.7   No results found for this basename: LIPASE, AMYLASE,  in the last 168 hours No results found for this basename: AMMONIA,  in the last 168 hours CBC:  Recent Labs Lab 04/27/14 1857 04/29/14 1911  WBC 5.2  --   NEUTROABS 2.9  --   HGB 13.6 15.3  HCT 39.4 45.0  MCV 82.4  --   PLT 255  --    Cardiac Enzymes: No results found for this basename: CKTOTAL, CKMB, CKMBINDEX, TROPONINI,  in the last 168 hours  BNP (last 3 results) No results found for this basename: PROBNP,  in the last 8760 hours CBG:  Recent Labs Lab 04/27/14 1807 04/27/14 2113 04/27/14 2243 04/27/14 2328 04/28/14 0040  GLUCAP 348* 334* 302* 305* 243*    Radiological Exams on Admission: Mr Brain Wo Contrast  04/29/2014   CLINICAL DATA:  53 year old male with acute blurred vision and right facial pain. Initial encounter. History of prior strokes, including left PICA infarct in 2014.  EXAM: MRI HEAD WITHOUT CONTRAST  TECHNIQUE: Multiplanar, multiecho pulse sequences of the brain and surrounding structures were obtained without intravenous contrast.  COMPARISON:  Brain MRI 06/07/2013.  FINDINGS: Interval expected evolution of the extensive and partially hemorrhagic left PCA infarct, which included involvement of the left thalamus. Wallerian degeneration. Ex vacuo changes to the left occipital and temporal horns. Chronic inferior left cerebellar infarct.  Major intracranial vascular flow voids are stable.  Multifocal restricted diffusion in the right PCA territory, including the occipital pole, central occipital white matter, as well as the posterior and mesial  right temporal lobe. The right hippocampus is spared. Very hippocampal gyrus is significantly affected. No evidence of associated hemorrhage at this time. Early T2 and FLAIR hyperintensity in the affected areas.  Susceptibility artifact suspected on diffusion weighted imaging in the left PCA territory. No deep gray matter, brainstem, or cerebellar restricted diffusion. No associated mass effect. Elsewhere scattered cerebral white matter T2 and FLAIR hyperintensity is stable to mildly progressed. No ventriculomegaly or midline shift. Negative pituitary, cervicomedullary junction, and upper cervical spine.  Visible internal auditory structures appear normal. Rightward gaze deviation. Visualized orbit soft tissues are within normal limits. Stable paranasal sinuses and mastoids. Stable scalp soft tissues. Normal bone marrow signal.  There is a C4 level disc herniation resulting in degenerative spinal stenosis which appears chronic.  IMPRESSION: 1. Acute right PCA territory infarct, patchy involvement most pronounced in the right para hippocampal gyrus.  Mild edema. No associated hemorrhage or mass effect. 2. Extensive chronic left PCA infarct with encephalomalacia and Wallerian degeneration. Chronic left cerebellar infarct. 3. 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   Electronically Signed   By: Lars Pinks M.D.   On: 04/29/2014 21:44    EKG: Independently reviewed.   Assessment/Plan Principal Problem:   CVA (cerebral infarction) Active Problems:   Diabetes mellitus type 2, uncontrolled   Dyslipidemia   Spastic hemiplegia affecting dominant side   Stroke  Stroke: He developed new stroke on the R PCA area as evidenced by MRI findings. Neurology was consulted. Dr. Armida Sans evaluated the patient to suggest stroke work up.   -will Admit to tele bed - follow dr. Armida Sans recommendation as follows: 1. HgbA1c, fasting lipid panel 2. MRI, MRA  of the brain without  contrast 3. Echocardiogram 4. Carotid dopplers 5. Prophylactic therapy-aspirin and plavix after passing swallowing evaluation 6. Risk factor modification 7. Telemetry monitoring 8. Frequent neuro checks 9. PT/OT SLP  Dm-II: last A1c was 10.9. He is not compliant with his insulin. He supposed to take Lantus 25 units daily -will start lantus 15 U daily when on NPO - SSI - check A1c  HLD: continue Lipitor and check Lipid panel  DVT ppx: SQ Heparin     Code Status: Full code Family Communication: None at bed side.  Disposition Plan: Admit to inpatient   Date of Service 04/30/2014    Ivor Costa Adam Callahan Pager [phone]  If 7PM-7AM, please contact night-coverage [email or web address] Password [email or web address] 04/30/2014, 12:27 AM

## 2014-04-30 NOTE — ED Notes (Signed)
Attempted report x1. 

## 2014-04-30 NOTE — Progress Notes (Signed)
Patient seen and examined  And agree with Ivor Costa, MD, assessment and plan  MRI showed new acute R PCA infarction.    Assessment and plan Stroke: He developed new stroke on the R PCA area as evidenced by MRI findings. Neurology was consulted. Dr. Armida Sans evaluated the patient to suggest stroke work up.  -will Admit to tele bed  - follow dr. Armida Sans recommendation as follows:  1. HgbA1c pending, fasting lipid panel -LDL 136, start statin 2. MRI, MRA of the brain without contrast  3. Echocardiogram pending 4. Carotid dopplers pending 5. Prophylactic therapy-aspirin and plavix after passing swallowing evaluation  6. Risk factor modification  7. Telemetry monitoring  8. Frequent neuro checks  9. PT/OT SLP pending   Dm-II: last A1c was 10.9. He is not compliant with his insulin. He supposed to take Lantus 25 units daily  -will start lantus 15 U daily when on NPO  - SSI  - Hemoglobin A1c pending   HLD: continue Lipitor and check Lipid panel

## 2014-04-30 NOTE — Progress Notes (Signed)
  Echocardiogram 2D Echocardiogram has been performed.  Adam Callahan 04/30/2014, 12:02 PM

## 2014-04-30 NOTE — Progress Notes (Signed)
PT Cancellation Note  Patient Details Name: Adam Callahan MRN: [id number] DOB: 1960-11-16   Cancelled Treatment:    Reason Eval/Treat Not Completed: Other (comment) (patient on bedrest - MD please clarify activity orders)   Shanna Cisco, [place] 04/30/2014, 2:05 PM

## 2014-04-30 NOTE — Progress Notes (Signed)
[facility] STROKE TEAM PROGRESS NOTE   HISTORY  Adam Callahan is an 53 y.o. 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  A family member is at the bedside and helps patient to gather some of his clinical information as patient seems to be a poor historian.  Adam. Littler was seen in the ED couple of days ago with similar complains but they said that " things got worse today" and thus they came to the ED again.  He said that he is having trouble getting up and also having some visual changes and right face pain.  Denies vertigo, double vision, difficulty swallowing, or worsening slurred speech/right sided weakness.  On aspirin and plavix.  MRI brain today revealed acute right PCA territory infarct, patchy involvement most pronounced in the right para hippocampal gyrus.  Date last known well: uncertain  Time last known well: uncertain  tPA Given: no, late presentation    SUBJECTIVE (INTERVAL HISTORY) Patient is a poor historian. Says he Started having fuzzy vision on the right. A neighbor called 911. He denied any drug use.    OBJECTIVE Temp:  [98.1 F (36.7 C)-98.8 F (37.1 C)] 98.2 F (36.8 C) (10/10 0321) Pulse Rate:  [76-119] 76 (10/10 0321) Cardiac Rhythm:  [-] Normal sinus rhythm (10/10 0750) Resp:  [12-20] 20 (10/10 0321) BP: (120-164)/(64-101) 120/64 mmHg (10/10 0321) SpO2:  [94 %-100 %] 99 % (10/10 0321) Weight:  [168 lb 8 oz (76.431 kg)] 168 lb 8 oz (76.431 kg) (10/10 0121)   Recent Labs Lab 04/27/14 2328 04/28/14 0040 04/30/14 0118 04/30/14 0806 04/30/14 1217  GLUCAP 305* 243* 181* 207* 236*    Recent Labs Lab 04/27/14 1857 04/29/14 1911 04/30/14 0030  NA 136* 136* 137  K 3.9 3.6* 3.5*  CL 97 99 97  CO2 26  --  23  GLUCOSE 350* 250* 180*  BUN 7 4* 6  CREATININE 0.60 0.60 0.62  CALCIUM 9.0  --  9.2     Recent Labs Lab 04/27/14 1857  AST 12  ALT 20  ALKPHOS 98  BILITOT 0.5  PROT 7.7  ALBUMIN 3.7    Recent Labs Lab 04/27/14 1857 04/29/14 1911 04/30/14 0030  WBC 5.2  --  5.4  NEUTROABS 2.9  --   --   HGB 13.6 15.3 14.1  HCT 39.4 45.0 40.0  MCV 82.4  --  82.3  PLT 255  --  280   No results found for this basename: CKTOTAL, CKMB, CKMBINDEX, TROPONINI,  in the last 168 hours  Recent Labs  04/27/14 1919  LABPROT 13.6  INR 1.04    Recent Labs  04/27/14 1944  COLORURINE YELLOW  LABSPEC 1.033*  PHURINE 7.0  GLUCOSEU >1000*  HGBUR NEGATIVE  BILIRUBINUR NEGATIVE  KETONESUR NEGATIVE  PROTEINUR NEGATIVE  UROBILINOGEN 0.2  NITRITE NEGATIVE  LEUKOCYTESUR NEGATIVE       Component Value Date/Time   CHOL 195 04/30/2014 0015   TRIG 88 04/30/2014 0015   HDL 41 04/30/2014 0015   CHOLHDL 4.8 04/30/2014 0015   VLDL 18 04/30/2014 0015   LDLCALC 136* 04/30/2014 0015   Lab Results  Component Value Date   HGBA1C 11.3* 04/30/2014      Component Value Date/Time   LABOPIA NONE DETECTED 04/27/2014 1944   COCAINSCRNUR NONE DETECTED 04/27/2014 1944   LABBENZ NONE DETECTED 04/27/2014 1944   AMPHETMU NONE  DETECTED 04/27/2014 1944   THCU NONE DETECTED 04/27/2014 1944   LABBARB NONE DETECTED 04/27/2014 1944     Recent Labs Lab 04/27/14 1919  ETH <11    Adam Callahan  04/30/2014   IMPRESSION: 1. Anatomic variation of the right PCA supply, with "duplicated" fetal PCAs such that the inferior and superior PCA divisions are supplied by separate right posterior communicating arteries. 2. The inferior PCA division is occluded in the "P2" equivalent segment. The superior PCA division remains normal. 3. Chronic occlusion of the left PCA. 4. Interval improved distal left vertebral artery, such that that vessel now appears dominant. 5. Stable anterior circulation with chronic moderate stenosis at the origin of a left MCA M2 branch.   Electronically Signed   By: Lars Pinks M.D.    On: 04/30/2014 15:02   Adam Brain Wo Callahan  04/29/2014     IMPRESSION: 1. Acute right PCA territory infarct, patchy involvement most pronounced in the right para hippocampal gyrus. Mild edema. No associated hemorrhage or mass effect. 2. Extensive chronic left PCA infarct with encephalomalacia and Wallerian degeneration. Chronic left cerebellar infarct. 3. 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   Electronically Signed   By: Lars Pinks M.D.   On: 04/29/2014 21:44     PHYSICAL EXAM PHYSICAL EXAM Physical exam: Exam: Gen: NAD Eyes: anicteric sclerae, moist conjunctivae                    CV: RRR, no MRG Mental Status: Alert, following commands. Says it is Saturday, 1990, November  Neuro: Detailed Neurologic Exam  Speech:    No dysarthria, no aphasia  Cranial Nerves:    The pupils are equal, round, and reactive to light. Fundi flat. EOMI, difficulty with finger counting right hemifields, Right facial droop. Hoarse voice.   Motor Observation:    no involuntary movements noted.     Strength: Weak right arm, antigravity only.       Cortical Sensory Modalities:     right visual neglect, extinction on right   ASSESSMENT/PLAN Adam Callahan is an 53 y.o. male with a past medical history significant for DM, PCA and PICA distribution infarcts with residual right hemiparesis and dysarthria who was brought to the ED due to right-sided vision changes. Poor historian. Per notes, not compliant with medications and stopped them several days previous. Denies drug use. MRi showed acute right PCA territory infarct, patchy involvement most pronounced in the right para hippocampal gyrus. Mild edema. No associated hemorrhage or mass effect. Also showed extensive chronic left PCA infarct with encephalomalacia and Wallerian degeneration and a chronic left cerebellar infarct.   Carotid Doppler  pending  2D Echo  pending  LDL 136, goal < 70, on a statin    HgbA1c 11.3   Patient should be counseled to be compliant with his antithrombotic medications  Risk factor education  Ongoing aggressive risk factor management  On ASA and Plavix Permissive hypertension <220/120 for 24-48 hours and then gradually normalize within 5-7 days BP goal long term normotensive   Hospital day # 1  I have personally examined this patient, reviewed notes, independently viewed imaging studies, participated in medical decision making and plan of care. I have made any additions or clarifications directly to the above note. Agree with note above.  Georgia Dom, MD  [facility]  Pager: [phone] 04/29/2014 5:16 PM     To contact Stroke Continuity provider, please refer  to [email or web address] After hours, contact General Neurology

## 2014-04-30 NOTE — Progress Notes (Signed)
OT Cancellation Note  Patient Details Name: Adam Callahan MRN: [id number] DOB: 01/26/1961   Cancelled Treatment:    Reason Eval/Treat Not Completed: Patient not medically ready. Pt currently on bedrest.   Benito Mccreedy OTR/L [phone] 04/30/2014, 3:48 PM

## 2014-04-30 NOTE — Evaluation (Signed)
SLP Cancellation Note  Patient Details Name: Adam Callahan MRN: [id number] DOB: 08-09-1960   Cancelled treatment:       Reason Eval/Treat Not Completed:  (order for speech language evaluation received for 10/11.  thank you.)  Luanna Salk, [place] [facility] SLP [phone]

## 2014-05-01 DIAGNOSIS — I639 Cerebral infarction, unspecified: Secondary | ICD-10-CM

## 2014-05-01 LAB — GLUCOSE, CAPILLARY
GLUCOSE-CAPILLARY: 220 mg/dL — AB (ref 70–99)
Glucose-Capillary: 141 mg/dL — ABNORMAL HIGH (ref 70–99)
Glucose-Capillary: 226 mg/dL — ABNORMAL HIGH (ref 70–99)
Glucose-Capillary: 174 mg/dL — ABNORMAL HIGH (ref 70–99)

## 2014-05-01 LAB — HEMOGLOBIN A1C
Hgb A1c MFr Bld: 11.1 % — ABNORMAL HIGH (ref <5.7)
Mean Plasma Glucose: 272 mg/dL — ABNORMAL HIGH (ref <117)

## 2014-05-01 MED ORDER — INSULIN GLARGINE 100 UNIT/ML ~~LOC~~ SOLN
20.0000 [IU] | Freq: Every day | SUBCUTANEOUS | Status: DC
  Administered 2014-05-02 – 2014-05-04 (×2): 20 [IU] via SUBCUTANEOUS
  Filled 2014-05-01 (×3): qty 0.2

## 2014-05-01 NOTE — Plan of Care (Signed)
Pt confused during the night trying to pull telemetry monitor off. Pt rubs forehead as if he has a headache, but pt denies  headache. Pt was able to rest during the early morning.

## 2014-05-01 NOTE — Progress Notes (Addendum)
[facility] PROGRESS NOTE  Adam Callahan GYB:[id number] DOB: December 26, 1960 DOA: 04/29/2014 PCP: No PCP Per Patient  Assessment/Plan: Principal Problem:   CVA (cerebral infarction) Active Problems:   Diabetes mellitus type 2, uncontrolled   Dyslipidemia   Spastic hemiplegia affecting dominant side   Stroke   Stroke: He developed new stroke on the R PCA area as evidenced by MRI findings. Neurology was consulted. Dr. Armida Sans evaluated the patient to suggest stroke work up.  -will Admit to tele bed  - follow dr. Armida Sans recommendation as follows:  1. HgbA1c 11.3 , fasting lipid panel -LDL 136, start statin  2. MRI, MRA of the brain without contrast  3. Echocardiogram EF: 50% - 55%,Moderate LVH with LVEF 87-68%, grade 1 diastolic dysfunction. Trivial mitral and tricuspid regurgitation. Unable to estimate PASP. No obvious PFO or ASD. 4. Carotid dopplers pending  5. Prophylactic therapy-aspirin and plavix  6. Risk factor modification  7. Telemetry monitoring  8. Frequent neuro checks  9. PT/OT SLP pending   Dm-II: last A1c was 10.9. He is not compliant with his insulin. He supposed to take Lantus 25 units daily  Currently on a carb modified diet Increase Lantus to 20 units - Hemoglobin A1c 11.3   HLD: continue Lipitor and check Lipid panel   HPI/Subjective: having trouble getting up and also having some visual changes and right face pain   Objective: Filed Vitals:   04/30/14 1640 04/30/14 1952 04/30/14 2336 05/01/14 0439  BP: 138/83 138/106 131/85 123/82  Pulse: 102 103 96 91  Temp:  98.8 F (37.1 C)  98.5 F (36.9 C)  TempSrc:  Oral  Oral  Resp: 20 18 18 16   Height:      Weight:    74.617 kg (164 lb 8 oz)  SpO2: 97% 99% 99% 100%    Intake/Output Summary (Last 24 hours) at 05/01/14 1118 Last data filed at 04/30/14 2100  Gross per 24 hour  Intake      0 ml  Output    600 ml  Net   -600 ml    Exam:  General: alert & oriented x self, In NAD   Cardiovascular: RRR, nl S1 s2  Respiratory: Decreased breath sounds at the bases, scattered rhonchi, no crackles  Abdomen: soft +BS NT/ND, no masses palpable  Extremities: No cyanosis and no edema Neurologic, no dysarthria or apasia      Data Reviewed: Basic Metabolic Panel:  Recent Labs Lab 04/27/14 1857 04/29/14 1911 04/30/14 0030  NA 136* 136* 137  K 3.9 3.6* 3.5*  CL 97 99 97  CO2 26  --  23  GLUCOSE 350* 250* 180*  BUN 7 4* 6  CREATININE 0.60 0.60 0.62  CALCIUM 9.0  --  9.2    Liver Function Tests:  Recent Labs Lab 04/27/14 1857  AST 12  ALT 20  ALKPHOS 98  BILITOT 0.5  PROT 7.7  ALBUMIN 3.7   No results found for this basename: LIPASE, AMYLASE,  in the last 168 hours No results found for this basename: AMMONIA,  in the last 168 hours  CBC:  Recent Labs Lab 04/27/14 1857 04/29/14 1911 04/30/14 0030  WBC 5.2  --  5.4  NEUTROABS 2.9  --   --   HGB 13.6 15.3 14.1  HCT 39.4 45.0 40.0  MCV 82.4  --  82.3  PLT 255  --  280    Cardiac Enzymes: No results found for this basename: CKTOTAL, CKMB, CKMBINDEX, TROPONINI,  in the last  168 hours BNP (last 3 results) No results found for this basename: PROBNP,  in the last 8760 hours   CBG:  Recent Labs Lab 04/30/14 0806 04/30/14 1217 04/30/14 1646 04/30/14 1949 05/01/14 0749  GLUCAP 207* 236* 262* 214* 141*    No results found for this or any previous visit (from the past 240 hour(s)).   Studies: Ct Head Wo Contrast  04/27/2014   CLINICAL DATA:  Onset of blurry vision in the right high at 5 p.m. this same day. Initial encounter.  EXAM: CT HEAD WITHOUT CONTRAST  TECHNIQUE: Contiguous axial images were obtained from the base of the skull through the vertex without intravenous contrast.  COMPARISON:  Brain MRI 06/07/2013 and head CT scan 06/06/2013.  FINDINGS: Remote left cerebellar, thalamus and PCA territory infarcts are again seen. No evidence of acute intracranial abnormality including  hemorrhage, infarct, mass lesion, mass effect, midline shift or abnormal extra-axial fluid collection is identified. No hydrocephalus or pneumocephalus. The calvarium is intact. Imaged paranasal sinuses and mastoid air cells are clear.  IMPRESSION: No acute finding in patient with remote infarcts as above.   Electronically Signed   By: Inge Rise M.D.   On: 04/27/2014 20:14   Mr Virgel Paling Wo Contrast  04/30/2014   CLINICAL DATA:  53 year old male with acute blurred vision and right facial pain diagnosed with right PCA infarct yesterday by MRI. Initial encounter.  EXAM: MRA HEAD WITHOUT CONTRAST  TECHNIQUE: Angiographic images of the Circle of Willis were obtained using MRA technique without intravenous contrast.  COMPARISON:  Brain MRI 11/27/2013. Neck and intracranial MRA 06/07/2013.  FINDINGS: Improved antegrade flow in the distal left vertebral artery, which now appears dominant compared to that on the right. The distal right vertebral functionally terminates in PICA as before. Improved flow at the vertebrobasilar junction. Patent AICA origins. No basilar artery stenosis. SCA origins remain patent, the right now appears irregular.  The left PCA remains occluded. Fetal type right PCA origin AND duplicated right posterior communicating artery is re - identified (separate supply of the right superior and inferior PCA territories. Both right posterior communicating artery are patent. The superior right PCA division is patent. However, the inferior division branch (supplied by the right Pcomm which has a more cephalad coarse) now is occluded in the equivalent of the right P2 segment (arrow on series 204, image 19).  Antegrade flow in both ICA siphons which are stable and without stenosis. Ophthalmic and right posterior communicating artery origins remain normal. Patent carotid termini. MCA and ACA origins are within normal limits. Anterior communicating artery with median artery of the corpus callosum, and  visualized bilateral ACA branches are stable and within normal limits (degraded by motion today). Visualized bilateral MCA branches are stable, with chronic moderate irregularity and stenosis at the origin of the middle sylvian division as seen in 2014. No major MCA branch occlusion.  IMPRESSION: 1. Anatomic variation of the right PCA supply, with "duplicated" fetal PCAs such that the inferior and superior PCA divisions are supplied by separate right posterior communicating arteries. 2. The inferior PCA division is occluded in the "P2" equivalent segment. The superior PCA division remains normal. 3. Chronic occlusion of the left PCA. 4. Interval improved distal left vertebral artery, such that that vessel now appears dominant. 5. Stable anterior circulation with chronic moderate stenosis at the origin of a left MCA M2 branch.   Electronically Signed   By: Lars Pinks M.D.   On: 04/30/2014 15:02   Mr Brain  Wo Contrast  04/29/2014   CLINICAL DATA:  53 year old male with acute blurred vision and right facial pain. Initial encounter. History of prior strokes, including left PICA infarct in 2014.  EXAM: MRI HEAD WITHOUT CONTRAST  TECHNIQUE: Multiplanar, multiecho pulse sequences of the brain and surrounding structures were obtained without intravenous contrast.  COMPARISON:  Brain MRI 06/07/2013.  FINDINGS: Interval expected evolution of the extensive and partially hemorrhagic left PCA infarct, which included involvement of the left thalamus. Wallerian degeneration. Ex vacuo changes to the left occipital and temporal horns. Chronic inferior left cerebellar infarct.  Major intracranial vascular flow voids are stable.  Multifocal restricted diffusion in the right PCA territory, including the occipital pole, central occipital white matter, as well as the posterior and mesial right temporal lobe. The right hippocampus is spared. Very hippocampal gyrus is significantly affected. No evidence of associated hemorrhage at this  time. Early T2 and FLAIR hyperintensity in the affected areas.  Susceptibility artifact suspected on diffusion weighted imaging in the left PCA territory. No deep gray matter, brainstem, or cerebellar restricted diffusion. No associated mass effect. Elsewhere scattered cerebral white matter T2 and FLAIR hyperintensity is stable to mildly progressed. No ventriculomegaly or midline shift. Negative pituitary, cervicomedullary junction, and upper cervical spine.  Visible internal auditory structures appear normal. Rightward gaze deviation. Visualized orbit soft tissues are within normal limits. Stable paranasal sinuses and mastoids. Stable scalp soft tissues. Normal bone marrow signal.  There is a C4 level disc herniation resulting in degenerative spinal stenosis which appears chronic.  IMPRESSION: 1. Acute right PCA territory infarct, patchy involvement most pronounced in the right para hippocampal gyrus. Mild edema. No associated hemorrhage or mass effect. 2. Extensive chronic left PCA infarct with encephalomalacia and Wallerian degeneration. Chronic left cerebellar infarct. 3. 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   Electronically Signed   By: Lars Pinks M.D.   On: 04/29/2014 21:44    Scheduled Meds: . aspirin EC  81 mg Oral Daily  . atorvastatin  20 mg Oral q1800  . clopidogrel  75 mg Oral Q breakfast  . heparin  5,000 Units Subcutaneous 3 times per day  . insulin aspart  0-9 Units Subcutaneous TID WC  . insulin glargine  15 Units Subcutaneous Daily   Continuous Infusions:   Principal Problem:   CVA (cerebral infarction) Active Problems:   Diabetes mellitus type 2, uncontrolled   Dyslipidemia   Spastic hemiplegia affecting dominant side   Stroke    Time spent: 40 minutes   [facility] Pager [phone]. If 7PM-7AM, please contact night-coverage at [email or web address], password [facility] 05/01/2014, 11:18 AM  LOS: 2 days

## 2014-05-01 NOTE — Evaluation (Signed)
Physical Therapy Evaluation Patient Details Name: Adam Callahan MRN: [id number] DOB: 30-Aug-1960 Today's Date: 05/01/2014   History of Present Illness  Pt admitted for R PCA acute infarct.  PMHx consists of L PCA CVA.  Clinical Impression  Pt admitted with above. Pt currently with functional limitations due to the deficits listed below (see PT Problem List). Pt very motivated to participate in therapy. Min assist needed for bed mobility and transfers.  +2 hand held min assist needed for ambulation 75'.  Pt will benefit from skilled PT to increase their independence and safety with mobility to allow discharge to the venue listed below. Recommend CIR consult.      Follow Up Recommendations CIR;Supervision/Assistance - 24 hour    Equipment Recommendations  None recommended by PT    Recommendations for Other Services Rehab consult     Precautions / Restrictions Precautions Precautions: Fall      Mobility  Bed Mobility Overal bed mobility: Needs Assistance Bed Mobility: Supine to Sit;Sit to Supine     Supine to sit: Min assist Sit to supine: Min assist   General bed mobility comments: use of bed rail  Transfers Overall transfer level: Needs assistance Equipment used: 1 person hand held assist Transfers: Sit to/from [employer] Sit to Stand: Min assist Stand pivot transfers: Min assist          Ambulation/Gait Ambulation/Gait assistance: +2 physical assistance;Min assist Ambulation Distance (Feet): 75 Feet Assistive device: 2 person hand held assist Gait Pattern/deviations: Steppage;Decreased stance time - left (steppage on right) Gait velocity: decreased      Stairs            Wheelchair Mobility    Modified Rankin (Stroke Patients Only)       Balance Overall balance assessment: Needs assistance Sitting-balance support: No upper extremity supported;Feet supported Sitting balance-Leahy Scale: Good     Standing balance support:  Single extremity supported Standing balance-Leahy Scale: Poor                               Pertinent Vitals/Pain Pain Assessment: No/denies pain    Home Living Family/patient expects to be discharged to:: Private residence Living Arrangements: Alone Available Help at Discharge: Family;Friend(s);Available 24 hours/day Type of Home: House Home Access: Other (comment) (threshold to porch and inside house)     Home Layout: One level Home Equipment: Cane - quad;Wheelchair - manual      Prior Function Level of Independence: Independent with assistive device(s)               Hand Dominance   Dominant Hand: Right (has acclemated to using left hand since his stroke 10 months ago.)    Extremity/Trunk Assessment   Upper Extremity Assessment: Defer to OT evaluation (spastic hemiplegia noted RUE)           Lower Extremity Assessment: RLE deficits/detail;LLE deficits/detail RLE Deficits / Details: spastic hemiplegia, unable to perform isolated joint movements LLE Deficits / Details: strength WFL     Communication   Communication: Expressive difficulties  Cognition Arousal/Alertness: Awake/alert Behavior During Therapy: WFL for tasks assessed/performed (labile) Overall Cognitive Status: Within Functional Limits for tasks assessed                      General Comments      Exercises        Assessment/Plan    PT Assessment Patient needs continued PT services  PT  Diagnosis Abnormality of gait;Hemiplegia dominant side   PT Problem List Decreased activity tolerance;Decreased balance;Decreased mobility;Decreased coordination;Impaired sensation;Decreased safety awareness;Impaired tone  PT Treatment Interventions DME instruction;Gait training;Functional mobility training;Therapeutic activities;Therapeutic exercise;Patient/family education;Balance training;Stair training   PT Goals (Current goals can be found in the Care Plan section) Acute Rehab PT  Goals Patient Stated Goal: home PT Goal Formulation: With patient Time For Goal Achievement: 05/15/14 Potential to Achieve Goals: Good    Frequency Min 4X/week   Barriers to discharge        Co-evaluation               End of Session Equipment Utilized During Treatment: Gait belt Activity Tolerance: Patient tolerated treatment well Patient left: in bed;with call bell/phone within reach;with family/visitor present;with bed alarm set Nurse Communication: Mobility status         Time: 6004-5997 PT Time Calculation (min): 29 min   Charges:   PT Evaluation $Initial PT Evaluation Tier I: 1 Procedure PT Treatments $Gait Training: 8-22 mins   PT G Codes:          Lorriane Shire 05/01/2014, 4:05 PM

## 2014-05-01 NOTE — Evaluation (Signed)
Occupational Therapy Evaluation Patient Details Name: Adam Callahan MRN: [id number] DOB: 09-Apr-1961 Today's Date: 05/01/2014    History of Present Illness Pt admitted for R PCA acute infarct.  PMHx consists of L PCA CVA.   Clinical Impression   Pt s/p above. Pt independent with ADLs, PTA. Feel pt will benefit from acute OT to increase independence prior to d/c. Recommending CIR for rehab.    Follow Up Recommendations  CIR;Supervision/Assistance - 24 hour    Equipment Recommendations  Other (comment) (tbd)    Recommendations for Other Services Rehab consult     Precautions / Restrictions Precautions Precautions: Fall Restrictions Weight Bearing Restrictions: No      Mobility Bed Mobility Overal bed mobility: Needs Assistance Bed Mobility: Supine to Sit;Sit to Supine     Supine to sit: Mod assist;Supervision Sit to supine: Supervision   General bed mobility comments: Intially requiring assist to come to EOB, then able to perform later with no physical assist and with bed rail.  Transfers Overall transfer level: Needs assistance Equipment used: 1 person hand held assist Transfers: Sit to/from Stand Sit to Stand: Min assist;Mod assist                 ADL Overall ADL's : Needs assistance/impaired Eating/Feeding: Set up;Sitting   Grooming: Oral care;Wash/dry face;Applying deodorant;Minimal assistance;Standing   Upper Body Bathing: Standing;Moderate assistance   Lower Body Bathing: Min guard;Sit to/from stand   Upper Body Dressing : Sitting;Standing;Minimal assistance   Lower Body Dressing: Sit to/from stand;Minimal assistance   Toilet Transfer: +2 for physical assistance;Moderate assistance;Ambulation (bed/chair)   Toileting- Clothing Manipulation and Hygiene: Sit to/from stand;Minimal assistance       Functional mobility during ADLs: +2 for physical assistance;Moderate assistance (hand held assist) General ADL Comments: Pt performed some ADLs  at sink. +2 assist needed for ambulation. Spoke about safety. Encouraged pt to try to use RUE to support items if able. Stood at sink with Min guard assist for balance. Discussed UB dressing technique.     Vision                     Perception     Praxis      Pertinent Vitals/Pain Pain Assessment: No/denies pain     Hand Dominance Right (has acclemated to using left hand since his CVA 10 months ag)   Extremity/Trunk Assessment Upper Extremity Assessment Upper Extremity Assessment: RUE deficits/detail RUE Deficits / Details: spastic hemiplegia at baseline; friend reports RUE is a little worse compared to baseline.   Lower Extremity Assessment Lower Extremity Assessment: Defer to PT evaluation RLE Deficits / Details: spastic hemiplegia, unable to perform isolated joint movements RLE Sensation: decreased light touch LLE Deficits / Details: strength WFL LLE Coordination: decreased fine motor       Communication Communication Communication: Expressive difficulties   Cognition Arousal/Alertness: Awake/alert Behavior During Therapy: WFL for tasks assessed/performed Overall Cognitive Status: History of cognitive impairments - at baseline (per friend, current cognition is baseline)                     General Comments       Exercises       Shoulder Instructions      Home Living Family/patient expects to be discharged to:: Private residence Living Arrangements: Alone Available Help at Discharge: Family;Friend(s);Available 24 hours/day Type of Home: House Home Access: Other (comment) (threshold to porch and inside house)     Home Layout: One level  Bathroom Shower/Tub: Corporate investment banker: Standard     Home Equipment: Teaching laboratory technician - Brewing technologist          Prior Functioning/Environment Level of Independence: Independent with assistive device(s)             OT Diagnosis: Hemiplegia dominant side    OT Problem List: Decreased strength;Impaired balance (sitting and/or standing);Decreased coordination;Decreased knowledge of use of DME or AE;Decreased knowledge of precautions;Decreased cognition;Impaired UE functional use   OT Treatment/Interventions: Self-care/ADL training;Therapeutic exercise;Neuromuscular education;DME and/or AE instruction;Therapeutic activities;Patient/family education;Balance training;Cognitive remediation/compensation    OT Goals(Current goals can be found in the care plan section) Acute Rehab OT Goals Patient Stated Goal: get better OT Goal Formulation: With patient Time For Goal Achievement: 05/15/14 Potential to Achieve Goals: Good ADL Goals Pt Will Perform Upper Body Bathing: with set-up;standing;sitting Pt Will Perform Lower Body Bathing: with supervision;sit to/from stand Pt Will Perform Upper Body Dressing: with set-up;sitting Pt Will Perform Lower Body Dressing: with supervision;sit to/from stand Pt Will Transfer to Toilet: with min assist;ambulating Pt Will Perform Toileting - Clothing Manipulation and hygiene: with supervision;sit to/from stand  OT Frequency: Min 2X/week   Barriers to D/C:            Co-evaluation              End of Session Equipment Utilized During Treatment: Gait belt Nurse Communication: Mobility status  Activity Tolerance: Patient tolerated treatment well Patient left: in bed;with call bell/phone within reach;with bed alarm set;with family/visitor present   Time: 8889-1694 OT Time Calculation (min): 26 min Charges:  OT General Charges $OT Visit: 1 Procedure OT Evaluation $Initial OT Evaluation Tier I: 1 Procedure OT Treatments $Self Care/Home Management : 8-22 mins G-CodesBenito Mccreedy OTR/L [phone] 05/01/2014, 6:21 PM

## 2014-05-01 NOTE — Progress Notes (Signed)
Adam Callahan [facility] STROKE TEAM PROGRESS NOTE   HISTORY  Adam Callahan is an 53 y.o. 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  A family member is at the bedside and helps patient to gather some of his clinical information as patient seems to be a poor historian.  Adam Callahan was seen in the ED couple of days ago with similar complains but they said that " things got worse today" and thus they came to the ED again.  He said that he is having trouble getting up and also having some visual changes and right face pain.  Denies vertigo, double vision, difficulty swallowing, or worsening slurred speech/right sided weakness.  On aspirin and plavix.  MRI brain today revealed acute right PCA territory infarct, patchy involvement most pronounced in the right para hippocampal gyrus.  Date last known well: uncertain  Time last known well: uncertain  tPA Given: no, late presentation    SUBJECTIVE (INTERVAL HISTORY) Patient is a poor historian. Says he Started having fuzzy vision on the right. A neighbor called 911. He denied any drug use. Today his vision is improved and he feels improved. Had stopped all his medications at home.   OBJECTIVE Temp:  [98.5 F (36.9 C)-98.8 F (37.1 C)] 98.5 F (36.9 C) (10/11 0439) Pulse Rate:  [91-103] 91 (10/11 0439) Cardiac Rhythm:  [-] Normal sinus rhythm (10/11 0800) Resp:  [16-18] 16 (10/11 0439) BP: (123-138)/(82-106) 123/82 mmHg (10/11 0439) SpO2:  [99 %-100 %] 100 % (10/11 0439) Weight:  [164 lb 8 oz (74.617 kg)] 164 lb 8 oz (74.617 kg) (10/11 0439)   Recent Labs Lab 04/30/14 1646 04/30/14 1949 05/01/14 0749 05/01/14 1148 05/01/14 1657  GLUCAP 262* 214* 141* 226* 220*    Recent Labs Lab 04/27/14 1857 04/29/14 1911 04/30/14 0030  NA 136* 136* 137  K 3.9 3.6* 3.5*  CL 97 99 97  CO2 26  --  23   GLUCOSE 350* 250* 180*  BUN 7 4* 6  CREATININE 0.60 0.60 0.62  CALCIUM 9.0  --  9.2    Recent Labs Lab 04/27/14 1857  AST 12  ALT 20  ALKPHOS 98  BILITOT 0.5  PROT 7.7  ALBUMIN 3.7    Recent Labs Lab 04/27/14 1857 04/29/14 1911 04/30/14 0030  WBC 5.2  --  5.4  NEUTROABS 2.9  --   --   HGB 13.6 15.3 14.1  HCT 39.4 45.0 40.0  MCV 82.4  --  82.3  PLT 255  --  280   No results found for this basename: CKTOTAL, CKMB, CKMBINDEX, TROPONINI,  in the last 168 hours No results found for this basename: LABPROT, INR,  in the last 72 hours No results found for this basename: COLORURINE, APPERANCEUR, LABSPEC, [place], GLUCOSEU, HGBUR, BILIRUBINUR, KETONESUR, PROTEINUR, UROBILINOGEN, NITRITE, LEUKOCYTESUR,  in the last 72 hours     Component Value Date/Time   CHOL 195 04/30/2014 0015   TRIG 88 04/30/2014 0015   HDL 41 04/30/2014 0015   CHOLHDL 4.8 04/30/2014 0015   VLDL 18 04/30/2014 0015   LDLCALC 136* 04/30/2014 0015   Lab Results  Component Value Date   HGBA1C 11.1* 04/30/2014      Component Value Date/Time   LABOPIA NONE DETECTED 04/27/2014 1944   COCAINSCRNUR NONE DETECTED 04/27/2014 1944   LABBENZ NONE DETECTED 04/27/2014 1944   AMPHETMU NONE DETECTED 04/27/2014  [place] 04/27/2014 1944   LABBARB NONE DETECTED 04/27/2014 1944     Recent Labs Lab 04/27/14 1919  ETH <11    Adam Callahan Head Wo Contrast  04/30/2014   IMPRESSION: 1. Anatomic variation of the right PCA supply, with "duplicated" fetal PCAs such that the inferior and superior PCA divisions are supplied by separate right posterior communicating arteries. 2. The inferior PCA division is occluded in the "P2" equivalent segment. The superior PCA division remains normal. 3. Chronic occlusion of the left PCA. 4. Interval improved distal left vertebral artery, such that that vessel now appears dominant. 5. Stable anterior circulation with chronic moderate stenosis at the origin of a left MCA M2 branch.    Electronically Signed   By: Lars Pinks M.D.   On: 04/30/2014 15:02   Adam Brain Wo Contrast  04/29/2014     IMPRESSION: 1. Acute right PCA territory infarct, patchy involvement most pronounced in the right para hippocampal gyrus. Mild edema. No associated hemorrhage or mass effect. 2. Extensive chronic left PCA infarct with encephalomalacia and Wallerian degeneration. Chronic left cerebellar infarct. 3. 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   Electronically Signed   By: Lars Pinks M.D.   On: 04/29/2014 21:44   2D echo: Left ventricle: The cavity size was normal. There was mild concentric hypertrophy. Systolic function was normal. The estimated ejection fraction was in the range of 55% to 60%. Although no diagnostic regional wall motion abnormality was identified, this possibility cannot be completely excluded on the basis of this study. Doppler parameters are consistent with abnormal left ventricular relaxation (grade 1 diastolic dysfunction).    PHYSICAL EXAM Physical exam: Exam: Gen: NAD Eyes: anicteric sclerae, moist conjunctivae                    CV: RRR, no MRG Mental Status: Alert, following commands. Note oriented to date or year  Neuro: Detailed Neurologic Exam  Speech:    No dysarthria, no aphasia  Cranial Nerves:    The pupils are equal, round, and reactive to light. Fundi flat. EOMI, difficulty with finger counting right hemifields, Right facial droop. Hoarse voice.   Motor Observation:    no involuntary movements noted. Increased tone right arm (chronic from previous stroke)  Sensory: decreased right extremities and face    Strength: Weak right arm, 2+     Cortical Sensory Modalities:     right visual neglect, extinction on right   ASSESSMENT/PLAN [name] is an 53 y.o. 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 Poor historian. Per notes, not compliant with medications and stopped them several days previous. Denies drug use. MRi showed acute right PCA territory infarct, patchy involvement most pronounced in the right para hippocampal gyrus. Mild edema. No associated hemorrhage or mass effect. Also showed extensive chronic left PCA infarct with encephalomalacia and Wallerian degeneration and a chronic left cerebellar infarct.  Carotid Doppler  Findings suggest 1-39% internal carotid artery stenosis bilaterally. The left vertebral artery is patent with antegrade flow, unable to visualize the right vertebral artery.  2D Echo  EF 55-60  LDL 136, goal < 70, on a statin   HgbA1c 11.3   Patient should be counseled to be compliant with his antithrombotic medications  Risk factor education  Ongoing aggressive risk  factor management  On ASA and Plavix Permissive hypertension <220/120 for 24-48 hours and then gradually normalize within 5-7 days BP goal long term normotensive   Hospital day # 2  I have personally examined this patient, reviewed notes, independently viewed imaging studies, participated in medical decision making and plan of care. I have made any additions or clarifications directly to the above note. Agree with note above.   Georgia Dom, MD  [facility]  Pager: [phone] 04/29/2014 5:16 PM     To contact Stroke Continuity provider, please refer to [email or web address] After hours, contact General Neurology

## 2014-05-01 NOTE — Progress Notes (Signed)
*  PRELIMINARY RESULTS* Vascular Ultrasound Carotid Duplex (Doppler) has been completed.  Findings suggest 1-39% internal carotid artery stenosis bilaterally. The left vertebral artery is patent with antegrade flow, unable to visualize the right vertebral artery.  05/01/2014 4:08 PM Maudry Mayhew, RVT, RDCS, RDMS

## 2014-05-01 NOTE — Progress Notes (Signed)
Utilization Review Completed.   Wandalene Abrams, RN, BSN Nurse Case Manager  

## 2014-05-02 ENCOUNTER — Inpatient Hospital Stay (HOSPITAL_COMMUNITY): Payer: BLUE CROSS/BLUE SHIELD

## 2014-05-02 ENCOUNTER — Encounter (HOSPITAL_COMMUNITY): Payer: Self-pay | Admitting: Radiology

## 2014-05-02 DIAGNOSIS — R208 Other disturbances of skin sensation: Secondary | ICD-10-CM

## 2014-05-02 DIAGNOSIS — G8110 Spastic hemiplegia affecting unspecified side: Secondary | ICD-10-CM

## 2014-05-02 DIAGNOSIS — I69920 Aphasia following unspecified cerebrovascular disease: Secondary | ICD-10-CM

## 2014-05-02 DIAGNOSIS — I6340 Cerebral infarction due to embolism of unspecified cerebral artery: Secondary | ICD-10-CM

## 2014-05-02 DIAGNOSIS — I69898 Other sequelae of other cerebrovascular disease: Secondary | ICD-10-CM

## 2014-05-02 LAB — GLUCOSE, CAPILLARY
GLUCOSE-CAPILLARY: 155 mg/dL — AB (ref 70–99)
GLUCOSE-CAPILLARY: 214 mg/dL — AB (ref 70–99)
GLUCOSE-CAPILLARY: 161 mg/dL — AB (ref 70–99)
Glucose-Capillary: 125 mg/dL — ABNORMAL HIGH (ref 70–99)

## 2014-05-02 LAB — COMPREHENSIVE METABOLIC PANEL
ALBUMIN: 3.5 g/dL (ref 3.5–5.2)
ALK PHOS: 76 U/L (ref 39–117)
ALT: 12 U/L (ref 0–53)
ANION GAP: 13 (ref 5–15)
AST: 15 U/L (ref 0–37)
BILIRUBIN TOTAL: 0.7 mg/dL (ref 0.3–1.2)
BUN: 12 mg/dL (ref 6–23)
CO2: 27 meq/L (ref 19–32)
CREATININE: 0.75 mg/dL (ref 0.50–1.35)
Calcium: 9 mg/dL (ref 8.4–10.5)
Chloride: 99 mEq/L (ref 96–112)
GFR calc Af Amer: >90 mL/min (ref >90)
GFR calc non Af Amer: >90 mL/min (ref >90)
Glucose, Bld: 147 mg/dL — ABNORMAL HIGH (ref 70–99)
Potassium: 3.3 mEq/L — ABNORMAL LOW (ref 3.7–5.3)
Sodium: 139 mEq/L (ref 137–147)
Total Protein: 7.4 g/dL (ref 6.0–8.3)

## 2014-05-02 MED ORDER — POTASSIUM CHLORIDE CRYS ER 20 MEQ PO TBCR
40.0000 meq | EXTENDED_RELEASE_TABLET | Freq: Once | ORAL | Status: AC
  Administered 2014-05-02: 40 meq via ORAL
  Filled 2014-05-02: qty 2

## 2014-05-02 MED ORDER — IOHEXOL 350 MG/ML SOLN
80.0000 mL | Freq: Once | INTRAVENOUS | Status: AC | PRN
  Administered 2014-05-02: 80 mL via INTRAVENOUS

## 2014-05-02 NOTE — Progress Notes (Signed)
Physical Therapy Treatment Patient Details Name: Adam Callahan MRN: [id number] DOB: 1961-01-19 Today's Date: 05/02/2014    History of Present Illness Pt admitted for R PCA acute infarct.  PMHx consists of L PCA CVA.    PT Comments    Progressing well.  Emphasis on sitting and standing balance and control.  R side moves very synergistically and gait is decomposed, but pt very motivated to work hard while therapist tries to facilitate normalized movement.  Follow Up Recommendations  CIR;Supervision/Assistance - 24 hour     Equipment Recommendations  None recommended by PT    Recommendations for Other Services Rehab consult     Precautions / Restrictions Precautions Precautions: Fall    Mobility  Bed Mobility Overal bed mobility: Needs Assistance Bed Mobility: Supine to Sit     Supine to sit: Min guard     General bed mobility comments: scoot fairly symetrically to EOB  Transfers Overall transfer level: Needs assistance Equipment used: 1 person hand held assist Transfers: Sit to/from Stand Sit to Stand: Min assist         General transfer comment: cues for technique and hand placement;  assist to kee pt symetrical  Ambulation/Gait Ambulation/Gait assistance: Min assist;+2 physical assistance Ambulation Distance (Feet): 180 Feet Assistive device: 2 person hand held assist Gait Pattern/deviations: Step-through pattern;Decreased stance time - right;Decreased step length - left;Decreased stride length;Trendelenburg;Narrow base of support Gait velocity: decreased   General Gait Details: short step length, midly ataxic on the R > L, with L hip drop with L swing.  Needed w/shift assist   Stairs            Wheelchair Mobility    Modified Rankin (Stroke Patients Only) Modified Rankin (Stroke Patients Only) Modified Rankin: Moderately severe disability     Balance Overall balance assessment: Needs assistance Sitting-balance support: No upper  extremity supported Sitting balance-Leahy Scale: Good Sitting balance - Comments: Worked on w/shift and sitting balance/stability with reaching and leg Kick activities   Standing balance support: Single extremity supported;No upper extremity supported Standing balance-Leahy Scale: Poor Standing balance comment: Worked on pregait incl w/shift, stepping and postural checks./                    Cognition Arousal/Alertness: Awake/alert Behavior During Therapy: WFL for tasks assessed/performed                        Exercises      General Comments        Pertinent Vitals/Pain Pain Assessment: No/denies pain    Home Living                      Prior Function            PT Goals (current goals can now be found in the care plan section) Acute Rehab PT Goals Patient Stated Goal: get better PT Goal Formulation: With patient Time For Goal Achievement: 05/15/14 Potential to Achieve Goals: Good Progress towards PT goals: Progressing toward goals    Frequency  Min 4X/week    PT Plan Current plan remains appropriate    Co-evaluation             End of Session   Activity Tolerance: Patient tolerated treatment well Patient left: in chair;with call bell/phone within reach;with family/visitor present     Time: 3976-7341 PT Time Calculation (min): 30 min  Charges:  $Gait Training: 8-22 mins $Neuromuscular Re-education: 8-22 mins  G Codes:      Chloeann Alfred, Tessie Fass 05/02/2014, 1:19 PM 05/02/2014  Donnella Sham, [place] [phone]  (pager)

## 2014-05-02 NOTE — Progress Notes (Signed)
[facility] PROGRESS NOTE  COBIN CADAVID IWL:[id number] DOB: 1960-11-14 DOA: 04/29/2014 PCP: No PCP Per Patient  Assessment/Plan: Principal Problem:   CVA (cerebral infarction) Active Problems:   Diabetes mellitus type 2, uncontrolled   Dyslipidemia   Spastic hemiplegia affecting dominant side   Stroke   Stroke: He developed new stroke on the R PCA area as evidenced by MRI findings. Neurology was consulted. Dr. Armida Sans evaluated the patient to suggest stroke work up.  -will Admit to tele bed  - follow dr. Armida Sans recommendation as follows:  1. HgbA1c 11.3 , fasting lipid panel -LDL 136, start statin  2. MRI, MRA of the brain without contrast  3. Echocardiogram EF: 50% - 55%,Moderate LVH with LVEF 17-40%, grade 1 diastolic dysfunction. Trivial mitral and tricuspid regurgitation. Unable to estimate PASP. No obvious PFO or ASD.  4. Carotid dopplers 1-39% internal carotid artery stenosis bilaterally 5. Prophylactic therapy-aspirin and plavix  6. Risk factor modification  7. Telemetry monitoring  8. Frequent neuro checks  9. PT/OT SLP CIR following for insurance approval  Dm-II: last A1c was 10.9. He is not compliant with his insulin. He supposed to take Lantus 25 units daily  Currently on a carb modified diet  Increase Lantus to 20 units  - Hemoglobin A1c 11.3   HLD: continue Lipitor and check Lipid panel   HPI/Subjective:   Today his vision is improved and he feels improved.   Objective: Filed Vitals:   05/01/14 2351 05/02/14 0517 05/02/14 0732 05/02/14 1203  BP: 106/74 91/52 116/85 142/96  Pulse: 94 61  117  Temp: 97.7 F (36.5 C) 97.9 F (36.6 C) 98.6 F (37 C) 98.5 F (36.9 C)  TempSrc: Oral Oral Oral Oral  Resp: 16 18 16 16   Height:      Weight:  74.777 kg (164 lb 13.7 oz)    SpO2: 98% 98%  98%    Intake/Output Summary (Last 24 hours) at 05/02/14 1218 Last data filed at 05/02/14 0900  Gross per 24 hour  Intake    360 ml  Output    300 ml  Net      60 ml    Exam:  General: alert & oriented x 3 In NAD  Cardiovascular: RRR, nl S1 s2  Respiratory: Decreased breath sounds at the bases, scattered rhonchi, no crackles  Abdomen: soft +BS NT/ND, no masses palpable  Extremities: No cyanosis and no edema      Data Reviewed: Basic Metabolic Panel:  Recent Labs Lab 04/27/14 1857 04/29/14 1911 04/30/14 0030 05/02/14 0405  NA 136* 136* 137 139  K 3.9 3.6* 3.5* 3.3*  CL 97 99 97 99  CO2 26  --  23 27  GLUCOSE 350* 250* 180* 147*  BUN 7 4* 6 12  CREATININE 0.60 0.60 0.62 0.75  CALCIUM 9.0  --  9.2 9.0    Liver Function Tests:  Recent Labs Lab 04/27/14 1857 05/02/14 0405  AST 12 15  ALT 20 12  ALKPHOS 98 76  BILITOT 0.5 0.7  PROT 7.7 7.4  ALBUMIN 3.7 3.5   No results found for this basename: LIPASE, AMYLASE,  in the last 168 hours No results found for this basename: AMMONIA,  in the last 168 hours  CBC:  Recent Labs Lab 04/27/14 1857 04/29/14 1911 04/30/14 0030  WBC 5.2  --  5.4  NEUTROABS 2.9  --   --   HGB 13.6 15.3 14.1  HCT 39.4 45.0 40.0  MCV 82.4  --  82.3  PLT 255  --  280    Cardiac Enzymes: No results found for this basename: CKTOTAL, CKMB, CKMBINDEX, TROPONINI,  in the last 168 hours BNP (last 3 results) No results found for this basename: PROBNP,  in the last 8760 hours   CBG:  Recent Labs Lab 05/01/14 1148 05/01/14 1657 05/01/14 2052 05/02/14 0734 05/02/14 1205  GLUCAP 226* 220* 174* 155* 214*    No results found for this or any previous visit (from the past 240 hour(s)).   Studies: Ct Head Wo Contrast  04/27/2014   CLINICAL DATA:  Onset of blurry vision in the right high at 5 p.m. this same day. Initial encounter.  EXAM: CT HEAD WITHOUT CONTRAST  TECHNIQUE: Contiguous axial images were obtained from the base of the skull through the vertex without intravenous contrast.  COMPARISON:  Brain MRI 06/07/2013 and head CT scan 06/06/2013.  FINDINGS: Remote left cerebellar, thalamus  and PCA territory infarcts are again seen. No evidence of acute intracranial abnormality including hemorrhage, infarct, mass lesion, mass effect, midline shift or abnormal extra-axial fluid collection is identified. No hydrocephalus or pneumocephalus. The calvarium is intact. Imaged paranasal sinuses and mastoid air cells are clear.  IMPRESSION: No acute finding in patient with remote infarcts as above.   Electronically Signed   By: Inge Rise M.D.   On: 04/27/2014 20:14   Mr Virgel Paling Wo Contrast  04/30/2014   CLINICAL DATA:  53 year old male with acute blurred vision and right facial pain diagnosed with right PCA infarct yesterday by MRI. Initial encounter.  EXAM: MRA HEAD WITHOUT CONTRAST  TECHNIQUE: Angiographic images of the Circle of Willis were obtained using MRA technique without intravenous contrast.  COMPARISON:  Brain MRI 11/27/2013. Neck and intracranial MRA 06/07/2013.  FINDINGS: Improved antegrade flow in the distal left vertebral artery, which now appears dominant compared to that on the right. The distal right vertebral functionally terminates in PICA as before. Improved flow at the vertebrobasilar junction. Patent AICA origins. No basilar artery stenosis. SCA origins remain patent, the right now appears irregular.  The left PCA remains occluded. Fetal type right PCA origin AND duplicated right posterior communicating artery is re - identified (separate supply of the right superior and inferior PCA territories. Both right posterior communicating artery are patent. The superior right PCA division is patent. However, the inferior division branch (supplied by the right Pcomm which has a more cephalad coarse) now is occluded in the equivalent of the right P2 segment (arrow on series 204, image 19).  Antegrade flow in both ICA siphons which are stable and without stenosis. Ophthalmic and right posterior communicating artery origins remain normal. Patent carotid termini. MCA and ACA origins are  within normal limits. Anterior communicating artery with median artery of the corpus callosum, and visualized bilateral ACA branches are stable and within normal limits (degraded by motion today). Visualized bilateral MCA branches are stable, with chronic moderate irregularity and stenosis at the origin of the middle sylvian division as seen in 2014. No major MCA branch occlusion.  IMPRESSION: 1. Anatomic variation of the right PCA supply, with "duplicated" fetal PCAs such that the inferior and superior PCA divisions are supplied by separate right posterior communicating arteries. 2. The inferior PCA division is occluded in the "P2" equivalent segment. The superior PCA division remains normal. 3. Chronic occlusion of the left PCA. 4. Interval improved distal left vertebral artery, such that that vessel now appears dominant. 5. Stable anterior circulation with chronic moderate stenosis at the origin of  a left MCA M2 branch.   Electronically Signed   By: Lars Pinks M.D.   On: 04/30/2014 15:02   Mr Brain Wo Contrast  04/29/2014   CLINICAL DATA:  53 year old male with acute blurred vision and right facial pain. Initial encounter. History of prior strokes, including left PICA infarct in 2014.  EXAM: MRI HEAD WITHOUT CONTRAST  TECHNIQUE: Multiplanar, multiecho pulse sequences of the brain and surrounding structures were obtained without intravenous contrast.  COMPARISON:  Brain MRI 06/07/2013.  FINDINGS: Interval expected evolution of the extensive and partially hemorrhagic left PCA infarct, which included involvement of the left thalamus. Wallerian degeneration. Ex vacuo changes to the left occipital and temporal horns. Chronic inferior left cerebellar infarct.  Major intracranial vascular flow voids are stable.  Multifocal restricted diffusion in the right PCA territory, including the occipital pole, central occipital white matter, as well as the posterior and mesial right temporal lobe. The right hippocampus is  spared. Very hippocampal gyrus is significantly affected. No evidence of associated hemorrhage at this time. Early T2 and FLAIR hyperintensity in the affected areas.  Susceptibility artifact suspected on diffusion weighted imaging in the left PCA territory. No deep gray matter, brainstem, or cerebellar restricted diffusion. No associated mass effect. Elsewhere scattered cerebral white matter T2 and FLAIR hyperintensity is stable to mildly progressed. No ventriculomegaly or midline shift. Negative pituitary, cervicomedullary junction, and upper cervical spine.  Visible internal auditory structures appear normal. Rightward gaze deviation. Visualized orbit soft tissues are within normal limits. Stable paranasal sinuses and mastoids. Stable scalp soft tissues. Normal bone marrow signal.  There is a C4 level disc herniation resulting in degenerative spinal stenosis which appears chronic.  IMPRESSION: 1. Acute right PCA territory infarct, patchy involvement most pronounced in the right para hippocampal gyrus. Mild edema. No associated hemorrhage or mass effect. 2. Extensive chronic left PCA infarct with encephalomalacia and Wallerian degeneration. Chronic left cerebellar infarct. 3. 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   Electronically Signed   By: Lars Pinks M.D.   On: 04/29/2014 21:44    Scheduled Meds: . aspirin EC  81 mg Oral Daily  . atorvastatin  20 mg Oral q1800  . clopidogrel  75 mg Oral Q breakfast  . heparin  5,000 Units Subcutaneous 3 times per day  . insulin aspart  0-9 Units Subcutaneous TID WC  . insulin glargine  20 Units Subcutaneous Daily   Continuous Infusions:   Principal Problem:   CVA (cerebral infarction) Active Problems:   Diabetes mellitus type 2, uncontrolled   Dyslipidemia   Spastic hemiplegia affecting dominant side   Stroke    Time spent: 40 minutes   [facility] Pager [phone]. If 7PM-7AM, please  contact night-coverage at [email or web address], password [facility] 05/02/2014, 12:18 PM  LOS: 3 days

## 2014-05-02 NOTE — Consult Note (Signed)
Physical Medicine and Rehabilitation Consult Reason for Consult: CVA Referring Physician: [facility]   HPI: Adam Callahan is a 53 y.o. 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 Patient lives alone and used walker prior to admission. Admitted 04/30/2014 with blurred vision and headache. 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 MRA of the brain with chronic occlusion of left PCA. Patient did not receive TPA. Echocardiogram with ejection fraction 77% grade 1 diastolic dysfunction. Carotid Dopplers with no ICA stenosis. Neurology consulted maintained on aspirin and Plavix for CVA prophylaxis. Subcutaneous heparin for DVT prophylaxis. Hemoglobin A1c 11.3 with insulin therapy as directed. Physical therapy evaluation completed 05/01/2014 with recommendations for physical medicine rehabilitation consult   Review of Systems  Eyes: Positive for blurred vision.  Gastrointestinal: Positive for constipation.  Musculoskeletal: Positive for myalgias.  Neurological: Positive for speech change and weakness.  All other systems reviewed and are negative.  Past Medical History  Diagnosis Date  . Diabetes mellitus    No past surgical history on file. Family History  Problem Relation Age of Onset  . Cervical cancer Mother   . Hypertension Sister   . Diabetes Mellitus II Maternal Uncle    Social History:  reports that he has quit smoking. He does not have any smokeless tobacco history on file. He reports that he does not drink alcohol or use illicit drugs. Allergies: No Known Allergies Medications Prior to Admission  Medication Sig Dispense Refill  . aspirin 81 MG chewable tablet Chew 1 tablet (81 mg total) by mouth daily.      Marland Kitchen atorvastatin (LIPITOR)  20 MG tablet Take 1 tablet (20 mg total) by mouth daily at 6 PM.  30 tablet  1  . clopidogrel (PLAVIX) 75 MG tablet Take 1 tablet (75 mg total) by mouth daily with breakfast.  30 tablet  1  . insulin aspart (NOVOLOG) 100 UNIT/ML injection Inject 6 Units into the skin 3 (three) times daily with meals.  1 vial  12  . insulin glargine (LANTUS) 100 UNIT/ML injection Inject 0.25 mLs (25 Units total) into the skin daily.  10 mL  12  . traMADol (ULTRAM) 50 MG tablet Take 50 mg by mouth every 6 (six) hours as needed for moderate pain.        Home: Home Living Family/patient expects to be discharged to:: Private residence Living Arrangements: Alone Available Help at Discharge: Family;Friend(s);Available 24 hours/day Type of Home: House Home Access: Other (comment) (threshold to porch and inside house) Home Layout: One level Home Equipment: Cane - quad;Wheelchair - manual;Shower seat  Functional History: Prior Function Level of Independence: Independent with assistive device(s) Functional Status:  Mobility: Bed Mobility Overal bed mobility: Needs Assistance Bed Mobility: Supine to Sit;Sit to Supine Supine to sit: Mod assist;Supervision Sit to supine: Supervision General bed mobility comments: Intially requiring assist to come to EOB, then able to perform later with no physical assist and with bed rail. Transfers Overall transfer level: Needs assistance Equipment used: 1 person hand held assist Transfers: Sit to/from Stand Sit to Stand: Min assist;Mod assist Stand pivot transfers: Min assist Ambulation/Gait Ambulation/Gait assistance: +2 physical assistance;Min assist Ambulation Distance (Feet): 75 Feet Assistive device: 2 person hand held assist Gait Pattern/deviations: Steppage;Decreased stance time - left (steppage on right) Gait velocity: decreased  ADL: ADL Overall ADL's : Needs assistance/impaired Eating/Feeding: Set up;Sitting Grooming: Oral care;Wash/dry face;Applying  deodorant;Minimal assistance;Standing Upper Body Bathing: Standing;Moderate assistance Lower Body Bathing: Min guard;Sit to/from stand Upper Body Dressing : Sitting;Standing;Minimal assistance Lower Body Dressing: Sit to/from stand;Minimal assistance Toilet Transfer: +2 for physical assistance;Moderate assistance;Ambulation (bed/chair) Toileting- Clothing Manipulation and Hygiene: Sit to/from stand;Minimal assistance Functional mobility during ADLs: +2 for physical assistance;Moderate assistance (hand held assist) General ADL Comments: Pt performed some ADLs at sink. +2 assist needed for ambulation. Spoke about safety. Encouraged pt to try to use RUE to support items if able. Stood at sink with Min guard assist for balance. Discussed UB dressing technique.  Cognition: Cognition Overall Cognitive Status: History of cognitive impairments - at baseline Orientation Level: Oriented to person;Oriented to place;Oriented to situation;Disoriented to time Cognition Arousal/Alertness: Awake/alert Behavior During Therapy: WFL for tasks assessed/performed Overall Cognitive Status: History of cognitive impairments - at baseline  Blood pressure 116/85, pulse 61, temperature 98.6 F (37 C), temperature source Oral, resp. rate 16, height 5\' 6"  (1.676 m), weight 74.777 kg (164 lb 13.7 oz), SpO2 98.00%. Physical Exam  Vitals reviewed. Constitutional: He is oriented to person, place, and time. He appears well-developed.  HENT:  Right facial droop  Eyes: EOM are normal.  Neck: Normal range of motion. Neck supple. No thyromegaly present.  Cardiovascular: Normal rate and regular rhythm.   Respiratory: Effort normal and breath sounds normal. No respiratory distress.  GI: Soft. Bowel sounds are normal. He exhibits no distension.  Neurological: He is alert and oriented to person, place, and time.  Speech is dysarthric but intelligible. He does follow simple commands. Fair awareness of his deficits. RUE 2/5  prox to distal, inconsistent. LUE 4/5. LE's 3+ to 4-HF to 4/5 ankles.  Has difficulty with attention and sequencing. Had no overt weakness or FMC issues in the left arm and leg. ?visual acuity inconsistent.   Skin: Skin is warm and dry.  Psychiatric: He has a normal mood and affect. His behavior is normal. Thought content normal.    Results for orders placed during the hospital encounter of 04/29/14 (from the past 24 hour(s))  GLUCOSE, CAPILLARY     Status: Abnormal   Collection Time    05/01/14 11:48 AM      Result Value Ref Range   Glucose-Capillary 226 (*) 70 - 99 mg/dL  GLUCOSE, CAPILLARY     Status: Abnormal   Collection Time    05/01/14  4:57 PM      Result Value Ref Range   Glucose-Capillary 220 (*) 70 - 99 mg/dL  GLUCOSE, CAPILLARY     Status: Abnormal   Collection Time    05/01/14  8:52 PM      Result Value Ref Range   Glucose-Capillary 174 (*) 70 - 99 mg/dL   Comment 1 Notify RN    COMPREHENSIVE METABOLIC PANEL     Status: Abnormal   Collection Time    05/02/14  4:05 AM      Result Value Ref Range   Sodium 139  137 - 147 mEq/L   Potassium 3.3 (*) 3.7 - 5.3 mEq/L   Chloride 99  96 - 112 mEq/L   CO2 27  19 - 32 mEq/L   Glucose, Bld 147 (*) 70 - 99 mg/dL   BUN 12  6 - 23 mg/dL   Creatinine, Ser 0.75  0.50 - 1.35 mg/dL   Calcium 9.0  8.4 - 10.5 mg/dL   Total Protein 7.4  6.0 - 8.3  g/dL   Albumin 3.5  3.5 - 5.2 g/dL   AST 15  0 - 37 U/L   ALT 12  0 - 53 U/L   Alkaline Phosphatase 76  39 - 117 U/L   Total Bilirubin 0.7  0.3 - 1.2 mg/dL   GFR calc non Af Amer >90  >90 mL/min   GFR calc Af Amer >90  >90 mL/min   Anion gap 13  5 - 15  GLUCOSE, CAPILLARY     Status: Abnormal   Collection Time    05/02/14  7:34 AM      Result Value Ref Range   Glucose-Capillary 155 (*) 70 - 99 mg/dL   Mr Virgel Paling Wo Contrast  04/30/2014   CLINICAL DATA:  53 year old male with acute blurred vision and right facial pain diagnosed with right PCA infarct yesterday by MRI. Initial  encounter.  EXAM: MRA HEAD WITHOUT CONTRAST  TECHNIQUE: Angiographic images of the Circle of Willis were obtained using MRA technique without intravenous contrast.  COMPARISON:  Brain MRI 11/27/2013. Neck and intracranial MRA 06/07/2013.  FINDINGS: Improved antegrade flow in the distal left vertebral artery, which now appears dominant compared to that on the right. The distal right vertebral functionally terminates in PICA as before. Improved flow at the vertebrobasilar junction. Patent AICA origins. No basilar artery stenosis. SCA origins remain patent, the right now appears irregular.  The left PCA remains occluded. Fetal type right PCA origin AND duplicated right posterior communicating artery is re - identified (separate supply of the right superior and inferior PCA territories. Both right posterior communicating artery are patent. The superior right PCA division is patent. However, the inferior division branch (supplied by the right Pcomm which has a more cephalad coarse) now is occluded in the equivalent of the right P2 segment (arrow on series 204, image 19).  Antegrade flow in both ICA siphons which are stable and without stenosis. Ophthalmic and right posterior communicating artery origins remain normal. Patent carotid termini. MCA and ACA origins are within normal limits. Anterior communicating artery with median artery of the corpus callosum, and visualized bilateral ACA branches are stable and within normal limits (degraded by motion today). Visualized bilateral MCA branches are stable, with chronic moderate irregularity and stenosis at the origin of the middle sylvian division as seen in 2014. No major MCA branch occlusion.  IMPRESSION: 1. Anatomic variation of the right PCA supply, with "duplicated" fetal PCAs such that the inferior and superior PCA divisions are supplied by separate right posterior communicating arteries. 2. The inferior PCA division is occluded in the "P2" equivalent segment. The  superior PCA division remains normal. 3. Chronic occlusion of the left PCA. 4. Interval improved distal left vertebral artery, such that that vessel now appears dominant. 5. Stable anterior circulation with chronic moderate stenosis at the origin of a left MCA M2 branch.   Electronically Signed   By: Lars Pinks M.D.   On: 04/30/2014 15:02    Assessment/Plan: Diagnosis: New right PCA infarct, prior left PCA infarct 1. Does the need for close, 24 hr/day medical supervision in concert with the patient's rehab needs make it unreasonable for this patient to be served in a less intensive setting? Yes 2. Co-Morbidities requiring supervision/potential complications: dm, right hemiparesis 3. Due to bladder management, bowel management, safety, skin/wound care, disease management, medication administration, pain management and patient education, does the patient require 24 hr/day rehab nursing? Yes 4. Does the patient require coordinated care of a physician, rehab nurse, PT (  1-2 hrs/day, 5 days/week), OT (1-2 hrs/day, 5 days/week) and SLP (1-2 hrs/day, 5 days/week) to address physical and functional deficits in the context of the above medical diagnosis(es)? Yes Addressing deficits in the following areas: balance, endurance, locomotion, strength, transferring, bowel/bladder control, bathing, dressing, feeding, grooming, toileting, cognition, language and psychosocial support 5. Can the patient actively participate in an intensive therapy program of at least 3 hrs of therapy per day at least 5 days per week? Yes 6. The potential for patient to make measurable gains while on inpatient rehab is good 7. Anticipated functional outcomes upon discharge from inpatient rehab are supervision  with PT, supervision and min assist with OT, supervision with SLP. 8. Estimated rehab length of stay to reach the above functional goals is: 10-14 days 9. Does the patient have adequate social supports to accommodate these discharge  functional goals? Yes and Potentially 10. Anticipated D/C setting: Home 11. Anticipated post D/C treatments: Ideal therapy 12. Overall Rehab/Functional Prognosis: excellent  RECOMMENDATIONS: This patient's condition is appropriate for continued rehabilitative care in the following setting: CIR Patient has agreed to participate in recommended program. Yes Note that insurance prior authorization may be required for reimbursement for recommended care.  Comment: Rehab Admissions Coordinator to follow up.  Thanks,  Meredith Staggers, MD, Mellody Drown     05/02/2014

## 2014-05-02 NOTE — Progress Notes (Signed)
Received prescreen request for inpatient rehab from PT. Rehab MD consult has already been ordered. An admission coordinator will follow up when consult is finished. Thanks.  Nanetta Batty, PT Rehabilitation Admissions Coordinator [phone]

## 2014-05-02 NOTE — Progress Notes (Signed)
    [facility] has been requested to perform a transesophageal echocardiogram on [name] 05/03/14 for CVA.  After careful review of history and examination, the risks and benefits of transesophageal echocardiogram have been explained including risks of esophageal damage, perforation (1:10,000 risk), bleeding, pharyngeal hematoma as well as other potential complications associated with conscious sedation including aspiration, arrhythmia, respiratory failure and death. Alternatives to treatment were discussed, questions were answered. Patient is willing to proceed.  He developed new stroke on the R PCA area as evidenced by MRI findings. Neurology was consulted. Dr. Armida Sans evaluated the patient to suggest stroke work up.   INGOLD,LAURA R, NP 05/02/2014 1:54 PM

## 2014-05-02 NOTE — ED Provider Notes (Signed)
Medical screening examination/treatment/procedure(s) were conducted as a shared visit with non-physician practitioner(s) and myself.  I personally evaluated the patient during the encounter. Pt presents w/ blurry vision or R eye which started last night, but had resolved by my exam. There was no curtain like appearance or field loss. No acute neuro findings on PE. Glu elevated and is likely cause of blurry vision. I doubt acute CVA/TIA.   EKG Interpretation   Date/Time:  Wednesday April 27 2014 18:01:45 EDT Ventricular Rate:  106 PR Interval:  151 QRS Duration: 80 QT Interval:  327 QTC Calculation: 434 R Axis:   24 Text Interpretation:  Age not entered, assumed to be  53 years old for  purpose of ECG interpretation Sinus tachycardia ED PHYSICIAN  INTERPRETATION AVAILABLE IN [place] Confirmed by TEST, Record  ([id number]) on 04/29/2014 6:56:10 AM        Ernestina Patches, MD 05/02/14 1527

## 2014-05-02 NOTE — Progress Notes (Addendum)
[facility] STROKE TEAM PROGRESS NOTE   HISTORY TABOR DENHAM is an 53 y.o. male with a past medical history significant for DM, PCA and PICA distribution infarcts with residual right hemiparesis and dysarthria, comes i For further evaluation of inability to move, HA, blurred vision. A family member is at the bedside and helps patient to gather some of his clinical information as patient seems to be a poor historian. Mr. Crum was seen in the ED couple of days ago with similar complains but they said that " things got worse today" and thus they came to the ED again. He said that he is having trouble getting up and also having some visual changes and right face pain. Denies vertigo, double vision, difficulty swallowing, or worsening slurred speech/right sided weakness. On aspirin and plavix. MRI brain today revealed acute right PCA territory infarct, patchy involvement most pronounced in the right para hippocampal gyrus. last known well: uncertain. tPA not given due to late presentation.    SUBJECTIVE (INTERVAL HISTORY) Patient is a poor historian. Can not tell what really happened for his recent stroke. He had previous stroke left with moderate deficit on the right side. Today his vision is improved and he feels improved. He is not compliant with medication at home due to financial reasons. Need social worker consult.   OBJECTIVE Temp:  [97.7 F (36.5 C)-99.3 F (37.4 C)] 98.6 F (37 C) (10/12 0732) Pulse Rate:  [61-103] 61 (10/12 0517) Cardiac Rhythm:  [-] Normal sinus rhythm (10/12 0800) Resp:  [16-18] 16 (10/12 0732) BP: (91-127)/(52-86) 116/85 mmHg (10/12 0732) SpO2:  [98 %-100 %] 98 % (10/12 0517) Weight:  [74.777 kg (164 lb 13.7 oz)] 74.777 kg (164 lb 13.7 oz) (10/12 0517)   Recent Labs Lab 05/01/14 0749 05/01/14 1148 05/01/14 1657 05/01/14 2052 05/02/14 0734  GLUCAP 141* 226* 220* 174* 155*    Recent Labs Lab 04/27/14 1857 04/29/14 1911 04/30/14 0030 05/02/14 0405  NA 136*  136* 137 139  K 3.9 3.6* 3.5* 3.3*  CL 97 99 97 99  CO2 26  --  23 27  GLUCOSE 350* 250* 180* 147*  BUN 7 4* 6 12  CREATININE 0.60 0.60 0.62 0.75  CALCIUM 9.0  --  9.2 9.0    Recent Labs Lab 04/27/14 1857 05/02/14 0405  AST 12 15  ALT 20 12  ALKPHOS 98 76  BILITOT 0.5 0.7  PROT 7.7 7.4  ALBUMIN 3.7 3.5    Recent Labs Lab 04/27/14 1857 04/29/14 1911 04/30/14 0030  WBC 5.2  --  5.4  NEUTROABS 2.9  --   --   HGB 13.6 15.3 14.1  HCT 39.4 45.0 40.0  MCV 82.4  --  82.3  PLT 255  --  280   No results found for this basename: CKTOTAL, CKMB, CKMBINDEX, TROPONINI,  in the last 168 hours No results found for this basename: LABPROT, INR,  in the last 72 hours No results found for this basename: COLORURINE, APPERANCEUR, LABSPEC, PHURINE, GLUCOSEU, HGBUR, BILIRUBINUR, KETONESUR, PROTEINUR, UROBILINOGEN, NITRITE, LEUKOCYTESUR,  in the last 72 hours     Component Value Date/Time   CHOL 195 04/30/2014 0015   TRIG 88 04/30/2014 0015   HDL 41 04/30/2014 0015   CHOLHDL 4.8 04/30/2014 0015   VLDL 18 04/30/2014 0015   LDLCALC 136* 04/30/2014 0015   Lab Results  Component Value Date   HGBA1C 11.1* 04/30/2014      Component Value Date/Time   LABOPIA NONE DETECTED 04/27/2014 1944  COCAINSCRNUR NONE DETECTED 04/27/2014 1944   LABBENZ NONE DETECTED 04/27/2014 1944   AMPHETMU NONE DETECTED 04/27/2014 1944   THCU NONE DETECTED 04/27/2014 1944   LABBARB NONE DETECTED 04/27/2014 1944     Recent Labs Lab 04/27/14 1919  ETH <11   Mr Brain Wo Contrast 04/29/2014    1. Acute right PCA territory infarct, patchy involvement most pronounced in the right para hippocampal gyrus. Mild edema. No associated hemorrhage or mass effect. 2. Extensive chronic left PCA infarct with encephalomalacia and Wallerian degeneration. Chronic left cerebellar infarct. 3. 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   [name] Head Wo Contrast 04/30/2014    1. Anatomic  variation of the right PCA supply, with "duplicated" fetal PCAs such that the inferior and superior PCA divisions are supplied by separate right posterior communicating arteries. 2. The inferior PCA division is occluded in the "P2" equivalent segment. The superior PCA division remains normal. 3. Chronic occlusion of the left PCA. 4. Interval improved distal left vertebral artery, such that that vessel now appears dominant. 5. Stable anterior circulation with chronic moderate stenosis at the origin of a left MCA M2 branch.      Carotid Doppler  Findings suggest 1-39% internal carotid artery stenosis bilaterally. The left vertebral artery is patent with antegrade flow, unable to visualize the right vertebral artery.  2D echo: Left ventricle: The cavity size was normal. There was mild concentric hypertrophy. Systolic function was normal. The estimated ejection fraction was in the range of 55% to 60%. Although no diagnostic regional wall motion abnormality was identified, this possibility cannot be completely excluded on the basis of this study. Doppler parameters are consistent with abnormal left ventricular relaxation (grade 1 diastolic dysfunction).  CT angio neck  Mild atherosclerotic disease involving the right carotid bulb and distal left common carotid artery without significant carotid stenosis. Both vertebral arteries are patent to the basilar without stenosis.   LDL 136 and A1C 11.3  PHYSICAL EXAM Exam: Gen: NAD Eyes: anicteric sclerae, moist conjunctivae                    CV: RRR, no MRG Neuro: Mental Status: Alert, awake following commands. Note oriented to date or year or president Speech:    dysarthria, impaired naming, but preserved repetition, no aphasia Cranial Nerves:    The pupils are equal, round, and reactive to light. Fundi flat. EOMI, difficulty with finger counting right hemifields, Right facial droop. Hoarse voice with dysarthria.  Motor Observation:    no involuntary  movements noted. Increased tone right arm (chronic from previous stroke) Sensory: decreased right extremities and face Strength: Weak right arm, 2+ Cortical Sensory Modalities:     right visual neglect, extinction on right   ASSESSMENT/PLAN ABISAI DEER is an 53 y.o. male with a past medical history significant for DM, PCA and PICA distribution infarcts with residual right hemiparesis and dysarthria who was brought to the ED due to right-sided vision changes. not compliant with medications, stopping them several days previous. Denies drug use. MRi showed acute right PCA territory infarct, patchy involvement most pronounced in the right para hippocampal gyrus. Mild edema. No associated hemorrhage or mass effect. Also showed extensive chronic left PCA infarct with encephalomalacia and Wallerian degeneration and a chronic left cerebellar infarct.  Stroke - acute R PCA infarct in the setting of R VA stenosis, fetal R PCA and medical non-compliance. Concern for anterior circulation embolic source.  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  - chronic L cerebellar infarct  Carotid Doppler  No significant  stenosis.   2D Echo  No source of embolus  Hyperlipidemia. LDL 136, goal < 70, on a statin   Diabetes type II. HgbA1c 11.3, uncontrolled  Suppose to be on ASA and Plavix prior to admission, but he is non-compliance due to financial difficulty. dural antiplatelet continued in hospital. Would continue dual antiplatelets x 3 months then one alone.   Patient counseled to be compliant with his antithrombotic medications  Need aggressive risk factor control. He needs financial assistance to afford his medications.  CIR recommended. Consult in place.                            CT angio neck - atherosclerosis without significant stenosis Given his young age, will check hypercoagulable tests (lupus anticoagulant, cardiolipin antibody,  prothrombin G20120A, homocysteine) and vasculitic labs (C3, C4, CH50, ANA, ESR, ANCA screen w/ reflex titer, Sjogren's Syndrome Antibodies [SSA & SSB]) as well as HIV and RPR as possible stroke sources. TEE to look for embolic source. Arranged with Vesta for tomorrow.  If positive for PFO (patent foramen ovale), check bilateral lower extremity venous dopplers to rule out DVT as possible source of stroke. (I have made patient NPO after midnight tonight). No indication for loop given young age and lack of atrial fibrillation type symptoms.  DM, uncontrolled - A1C 11.3 - on insulin - SSI - need aggressive modificatoin to achieve goal of A1C < 6.5  Hyperlipidemia - LDL 136 - on lipitor - continue on discharge.  Hospital day # 3  Burnetta Sabin, MSN, RN, ANVP-BC, ANP-BC, [facility] Pager: [phone] 05/02/2014 10:42 AM   I, the attending vascular neurologist, have personally obtained a history, examined the patient, evaluated laboratory data, individually viewed imaging studies, and formulated the assessment and plan of care.  I have made any additions or clarifications directly to the above note and agree with the findings and plan as currently documented.    Rosalin Hawking, MD PhD Stroke Neurology 05/02/2014 10:49 PM     To contact Stroke Continuity provider, please refer to [email or web address] After hours, contact General Neurology

## 2014-05-02 NOTE — Evaluation (Signed)
Speech Language Pathology Evaluation Patient Details Name: Adam Callahan MRN: [id number] DOB: 1961/07/14 Today's Date: 05/02/2014 Time: 1443-1540 SLP Time Calculation (min): 45 min  Problem List:  Patient Active Problem List   Diagnosis Date Noted  . Stroke 04/30/2014  . Spastic hemiplegia affecting dominant side 10/01/2013  . Aphasia as late effect of cerebrovascular accident 10/01/2013  . 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 Dehydration 06/07/2013  . Hypokalemia 06/07/2013  . Dyslipidemia 06/07/2013  . CVA (cerebral infarction) 06/06/2013  . Diabetes mellitus type 2, uncontrolled 06/06/2013  . Rhabdomyolysis 06/06/2013   Past Medical History:  Past Medical History  Diagnosis Date  . Diabetes mellitus   . Hypertension    Past Surgical History: No past surgical history on file. HPI:  Adam Callahan is a 53 y.o. 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 Patient lives alone and used walker prior to admission. Admitted 04/30/2014 with blurred vision and headache. 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 MRA of the brain with chronic occlusion of left PCA. Patient did not receive TPA. Echocardiogram with ejection fraction 08% grade 1 diastolic dysfunction. Carotid Dopplers with no ICA stenosis..   Assessment / Plan / Recommendation Clinical Impression  Very difficulty to assess due to premorbid deficits of dysarthria and dysnomia, and fact that pt is a poor historian.  Pt reports he fell from a ladder at work about one month ago and struck the back of his head.  He states "this is the reason all of this is happening."  Chart review reveals pt  had a stroke and was in CIR 12/14, with baseline of dysarthria and right sided weakness.  Currently, pt is emotionally labile, is disoriented to situation and time (unable to state what year it is and stating it is Feb/Oct.), unable to state months of the year.  Pt appears to have deficits in memory and attention, and reports that he has never been able to read or write well.  Pt will benefit from cognitive retraining while in pt.  Will gather more information from family/friends re: baseline level of functioning.    SLP Assessment  Patient needs continued Speech Lanaguage Pathology Services (TBD)    Follow Up Recommendations  Inpatient Rehab    Frequency and Duration min 2x/week  2 weeks   Pertinent Vitals/Pain Pain Assessment: No/denies pain   SLP Goals  Patient/Family Stated Goal: "I want all the therapy I can get.  I want to get better." Potential to Achieve Goals: Fair Potential Considerations: Previous level of function  SLP Evaluation Prior Functioning  Cognitive/Linguistic Baseline: Baseline deficits Baseline deficit details: Previous left PCA stroke with residual dysarthria and word finding deficits Type of Home: House  Lives With: Alone Education: 12 grade, but states he doesn'Callahan read well. Vocation: Unemployed (per pt. trying to obtain disability)   Cognition  Overall Cognitive Status: Difficult to assess Orientation Level: Oriented to person;Oriented to place;Disoriented to time;Disoriented to situation Attention: Selective Selective Attention: Impaired Selective Attention Impairment: Verbal basic Memory: Impaired Memory Impairment: Prospective memory;Storage deficit Awareness: Impaired Awareness Impairment: Anticipatory impairment Problem Solving: Impaired Problem Solving Impairment: Verbal complex Behaviors: Lability Safety/Judgment: Impaired    Comprehension  Auditory Comprehension Overall Auditory Comprehension: Appears within functional limits for tasks  assessed  Conversation: Simple Visual Recognition/Discrimination Discrimination: Not tested Reading Comprehension Reading Status: Not tested    Expression Expression Primary Mode of Expression: Verbal Verbal Expression Overall Verbal Expression: Impaired at baseline Initiation: No impairment Automatic Speech: Month of year (unable) Level of Generative/Spontaneous Verbalization: Conversation Naming: Impairment Responsive: 76-100% accurate Confrontation: Impaired Convergent: 75-100% accurate Divergent: 75-100% accurate Verbal Errors: Aware of errors;Perseveration (dysnomia) Interfering Components: Attention;Premorbid deficit Written Expression Dominant Hand: Right Written Expression: Not tested   Oral / Motor Oral Motor/Sensory Function Overall Oral Motor/Sensory Function: Impaired at baseline Labial ROM: Reduced right Labial Symmetry: Abnormal symmetry right Labial Strength: Reduced Lingual ROM: Reduced right Lingual Symmetry: Abnormal symmetry right Lingual Strength: Reduced Facial ROM: Reduced right Motor Speech Overall Motor Speech: Impaired at baseline Respiration: Impaired Level of Impairment: Conversation Phonation: Wet;Hoarse;Low vocal intensity Resonance: Within functional limits Articulation: Impaired Level of Impairment: Conversation Intelligibility: Intelligible Interfering Components: Premorbid status Effective Techniques: Slow rate;Increased vocal intensity;Over-articulate   GO     Adam Callahan 05/02/2014, 4:02 PM

## 2014-05-02 NOTE — Plan of Care (Signed)
Problem: SLP Cognition Goals Goal: Misc Cognitive Goal #1 Pt will answer orientation questions correctly with min cueing.

## 2014-05-02 NOTE — Progress Notes (Signed)
Inpatient Diabetes Program Recommendations  AACE/ADA: New Consensus Statement on Inpatient Glycemic Control (2013)  Target Ranges:  Prepandial:   less than 140 mg/dL      Peak postprandial:   less than 180 mg/dL (1-2 hours)      Critically ill patients:  140 - 180 mg/dL  Results for [name], KU (MRN [id number]) as of 05/02/2014 12:44  Ref. Range 05/01/2014 11:48 05/01/2014 16:57 05/01/2014 20:52 05/02/2014 07:34 05/02/2014 12:05  Glucose-Capillary Latest Range: 70-99 mg/dL 226 (H) 220 (H) 174 (H) 155 (H) 214 (H)   Inpatient Diabetes Program Recommendations Insulin - Meal Coverage: add Novolog 4 units TID with meals per Glycemic Control Order-set (not given if eats <50% of meals) Thank you  Adam Callahan BSN, RN,CDE Inpatient Diabetes Coordinator [phone] (team pager)

## 2014-05-02 NOTE — Progress Notes (Signed)
Inpatient Rehabilitation  I attempted to see pt bedside.  He was off the floor for CT scan per RN.  Will follow up in the am.  Please call if questions.  [place] Admissions Coordinator Cell [phone] Office [phone]

## 2014-05-03 ENCOUNTER — Encounter (HOSPITAL_COMMUNITY): Payer: BLUE CROSS/BLUE SHIELD | Admitting: Certified Registered"

## 2014-05-03 ENCOUNTER — Inpatient Hospital Stay (HOSPITAL_COMMUNITY): Payer: BLUE CROSS/BLUE SHIELD | Admitting: Certified Registered"

## 2014-05-03 ENCOUNTER — Other Ambulatory Visit: Payer: Self-pay | Admitting: Neurology

## 2014-05-03 ENCOUNTER — Encounter (HOSPITAL_COMMUNITY)
Admission: EM | Disposition: A | Discharge status: Home / Self Care | Payer: Self-pay | Source: Home / Self Care | Attending: Internal Medicine

## 2014-05-03 ENCOUNTER — Encounter (HOSPITAL_COMMUNITY): Payer: Self-pay | Admitting: Internal Medicine

## 2014-05-03 DIAGNOSIS — I341 Nonrheumatic mitral (valve) prolapse: Secondary | ICD-10-CM

## 2014-05-03 DIAGNOSIS — I639 Cerebral infarction, unspecified: Secondary | ICD-10-CM

## 2014-05-03 DIAGNOSIS — I63331 Cerebral infarction due to thrombosis of right posterior cerebral artery: Secondary | ICD-10-CM

## 2014-05-03 HISTORY — PX: TEE WITHOUT CARDIOVERSION: SHX5443

## 2014-05-03 LAB — COMPREHENSIVE METABOLIC PANEL
ALK PHOS: 82 U/L (ref 39–117)
ALT: 16 U/L (ref 0–53)
AST: 19 U/L (ref 0–37)
Albumin: 3.9 g/dL (ref 3.5–5.2)
Anion gap: 13 (ref 5–15)
BUN: 8 mg/dL (ref 6–23)
CALCIUM: 9.3 mg/dL (ref 8.4–10.5)
CO2: 26 meq/L (ref 19–32)
Chloride: 99 mEq/L (ref 96–112)
Creatinine, Ser: 0.69 mg/dL (ref 0.50–1.35)
GFR calc Af Amer: >90 mL/min (ref >90)
Glucose, Bld: 191 mg/dL — ABNORMAL HIGH (ref 70–99)
POTASSIUM: 4 meq/L (ref 3.7–5.3)
SODIUM: 138 meq/L (ref 137–147)
Total Bilirubin: 0.4 mg/dL (ref 0.3–1.2)
Total Protein: 8 g/dL (ref 6.0–8.3)

## 2014-05-03 LAB — GLUCOSE, CAPILLARY
GLUCOSE-CAPILLARY: 149 mg/dL — AB (ref 70–99)
GLUCOSE-CAPILLARY: 148 mg/dL — AB (ref 70–99)
Glucose-Capillary: 146 mg/dL — ABNORMAL HIGH (ref 70–99)
Glucose-Capillary: 147 mg/dL — ABNORMAL HIGH (ref 70–99)

## 2014-05-03 LAB — SEDIMENTATION RATE: Sed Rate: 10 mm/hr (ref 0–16)

## 2014-05-03 SURGERY — ECHOCARDIOGRAM, TRANSESOPHAGEAL
Anesthesia: General

## 2014-05-03 MED ORDER — PROPOFOL 10 MG/ML IV BOLUS
INTRAVENOUS | Status: DC | PRN
  Administered 2014-05-03: 150 mg via INTRAVENOUS

## 2014-05-03 MED ORDER — SODIUM CHLORIDE 0.9 % IV SOLN
INTRAVENOUS | Status: DC | PRN
  Administered 2014-05-03: 10:00:00 via INTRAVENOUS

## 2014-05-03 MED ORDER — SUCCINYLCHOLINE CHLORIDE 20 MG/ML IJ SOLN
INTRAMUSCULAR | Status: DC | PRN
  Administered 2014-05-03: 40 mg via INTRAVENOUS
  Administered 2014-05-03: 100 mg via INTRAVENOUS

## 2014-05-03 MED ORDER — MIDAZOLAM HCL 10 MG/2ML IJ SOLN
INTRAMUSCULAR | Status: DC | PRN
  Administered 2014-05-03 (×2): 1 mg via INTRAVENOUS
  Administered 2014-05-03: 2 mg via INTRAVENOUS

## 2014-05-03 MED ORDER — PHENYLEPHRINE HCL 10 MG/ML IJ SOLN
INTRAMUSCULAR | Status: DC | PRN
  Administered 2014-05-03 (×2): 80 ug via INTRAVENOUS

## 2014-05-03 MED ORDER — LIDOCAINE HCL (CARDIAC) 20 MG/ML IV SOLN
INTRAVENOUS | Status: DC | PRN
  Administered 2014-05-03: 60 mg via INTRAVENOUS

## 2014-05-03 MED ORDER — BUTAMBEN-TETRACAINE-BENZOCAINE 2-2-14 % EX AERO
INHALATION_SPRAY | CUTANEOUS | Status: DC | PRN
Start: 2014-05-03 — End: 2014-05-03
  Administered 2014-05-03: 1 via TOPICAL

## 2014-05-03 MED ORDER — INSULIN ASPART 100 UNIT/ML ~~LOC~~ SOLN
4.0000 [IU] | Freq: Three times a day (TID) | SUBCUTANEOUS | Status: DC
  Administered 2014-05-03 – 2014-05-04 (×4): 4 [IU] via SUBCUTANEOUS

## 2014-05-03 MED ORDER — FENTANYL CITRATE 0.05 MG/ML IJ SOLN
INTRAMUSCULAR | Status: AC
  Filled 2014-05-03: qty 2

## 2014-05-03 MED ORDER — FENTANYL CITRATE 0.05 MG/ML IJ SOLN
INTRAMUSCULAR | Status: DC | PRN
  Administered 2014-05-03 (×2): 25 ug via INTRAVENOUS

## 2014-05-03 MED ORDER — SODIUM CHLORIDE 0.9 % IV SOLN: INTRAVENOUS | Status: DC

## 2014-05-03 MED ORDER — LIDOCAINE VISCOUS 2 % MT SOLN
OROMUCOSAL | Status: AC
  Filled 2014-05-03: qty 15

## 2014-05-03 MED ORDER — LIDOCAINE VISCOUS 2 % MT SOLN
OROMUCOSAL | Status: DC | PRN
  Administered 2014-05-03: 7 mL via OROMUCOSAL

## 2014-05-03 MED ORDER — DIPHENHYDRAMINE HCL 50 MG/ML IJ SOLN
INTRAMUSCULAR | Status: AC
  Filled 2014-05-03: qty 1

## 2014-05-03 MED ORDER — MIDAZOLAM HCL 5 MG/ML IJ SOLN
INTRAMUSCULAR | Status: AC
  Filled 2014-05-03: qty 2

## 2014-05-03 NOTE — Transfer of Care (Signed)
Immediate Anesthesia Transfer of Care Note  Patient: Adam Callahan  Procedure(s) Performed: Procedure(s): TRANSESOPHAGEAL ECHOCARDIOGRAM (TEE) (N/A)  Patient Location: Endoscopy Unit  Anesthesia Type:General  Level of Consciousness: awake  Airway & Oxygen Therapy: Patient Spontanous Breathing and Patient connected to nasal cannula oxygen  Post-op Assessment: Report given to PACU RN, Post -op Vital signs reviewed and stable and Patient moving all extremities  Post vital signs: Reviewed and stable  Complications: No apparent anesthesia complications

## 2014-05-03 NOTE — H&P (Signed)
     INTERVAL PROCEDURE H&P  History and Physical Interval Note:  05/03/2014 7:40 AM  Adam Callahan has presented today for their planned procedure. The various methods of treatment have been discussed with the patient and family. After consideration of risks, benefits and other options for treatment, the patient has consented to the procedure.  The patients' outpatient history has been reviewed, patient examined, and no change in status from most recent office note within the past 30 days. I have reviewed the patients' chart and labs and will proceed as planned. Questions were answered to the patient's satisfaction.   Pixie Casino, MD, [facility] Attending Cardiologist [facility]  Muneeb Veras C 05/03/2014, 7:40 AM

## 2014-05-03 NOTE — Progress Notes (Signed)
Echocardiogram Echocardiogram Transesophageal has been performed.  Joelene Millin 05/03/2014, 11:24 AM

## 2014-05-03 NOTE — Progress Notes (Signed)
[facility] STROKE TEAM PROGRESS NOTE   HISTORY Adam Callahan is an 53 y.o. male with a past medical history significant for DM, PCA and PICA distribution infarcts with residual right hemiparesis and dysarthria, comes i For further evaluation of inability to move, HA, blurred vision. A family member is at the bedside and helps patient to gather some of his clinical information as patient seems to be a poor historian. Adam Callahan was seen in the ED couple of days ago with similar complains but they said that " things got worse today" and thus they came to the ED again. He said that he is having trouble getting up and also having some visual changes and right face pain. Denies vertigo, double vision, difficulty swallowing, or worsening slurred speech/right sided weakness. On aspirin and plavix. MRI brain today revealed acute right PCA territory infarct, patchy involvement most pronounced in the right para hippocampal gyrus. last known well: uncertain. tPA not given due to late presentation.    SUBJECTIVE (INTERVAL HISTORY) Patient is a poor historian. Just back from TEE where small PFO was found. Patient was intubated for short while during procedure.    OBJECTIVE Temp:  [96.5 F (35.8 C)-98.5 F (36.9 C)] 96.5 F (35.8 C) (10/13 1015) Pulse Rate:  [82-117] 88 (10/13 1100) Cardiac Rhythm:  [-] Normal sinus rhythm (10/12 2020) Resp:  [9-38] 17 (10/13 1100) BP: (111-186)/(78-115) 149/90 mmHg (10/13 1100) SpO2:  [95 %-100 %] 100 % (10/13 1100) Weight:  [75.978 kg (167 lb 8 oz)] 75.978 kg (167 lb 8 oz) (10/13 0503)   Recent Labs Lab 05/02/14 0734 05/02/14 1205 05/02/14 1700 05/02/14 2125 05/03/14 0746  GLUCAP 155* 214* 161* 125* 149*    Recent Labs Lab 04/27/14 1857 04/29/14 1911 04/30/14 0030 05/02/14 0405  NA 136* 136* 137 139  K 3.9 3.6* 3.5* 3.3*  CL 97 99 97 99  CO2 26  --  23 27  GLUCOSE 350* 250* 180* 147*  BUN 7 4* 6 12  CREATININE 0.60 0.60 0.62 0.75  CALCIUM 9.0  --  9.2  9.0    Recent Labs Lab 04/27/14 1857 05/02/14 0405  AST 12 15  ALT 20 12  ALKPHOS 98 76  BILITOT 0.5 0.7  PROT 7.7 7.4  ALBUMIN 3.7 3.5    Recent Labs Lab 04/27/14 1857 04/29/14 1911 04/30/14 0030  WBC 5.2  --  5.4  NEUTROABS 2.9  --   --   HGB 13.6 15.3 14.1  HCT 39.4 45.0 40.0  MCV 82.4  --  82.3  PLT 255  --  280   No results found for this basename: CKTOTAL, CKMB, CKMBINDEX, TROPONINI,  in the last 168 hours No results found for this basename: LABPROT, INR,  in the last 72 hours No results found for this basename: COLORURINE, APPERANCEUR, LABSPEC, [place], GLUCOSEU, HGBUR, BILIRUBINUR, KETONESUR, PROTEINUR, UROBILINOGEN, NITRITE, LEUKOCYTESUR,  in the last 72 hours     Component Value Date/Time   CHOL 195 04/30/2014 0015   TRIG 88 04/30/2014 0015   HDL 41 04/30/2014 0015   CHOLHDL 4.8 04/30/2014 0015   VLDL 18 04/30/2014 0015   LDLCALC 136* 04/30/2014 0015   Lab Results  Component Value Date   HGBA1C 11.1* 04/30/2014      Component Value Date/Time   LABOPIA NONE DETECTED 04/27/2014 1944   COCAINSCRNUR NONE DETECTED 04/27/2014 1944   LABBENZ NONE DETECTED 04/27/2014 1944   AMPHETMU NONE DETECTED 04/27/2014 [place] DETECTED 04/27/2014 1944  LABBARB NONE DETECTED 04/27/2014 1944     Recent Labs Lab 04/27/14 1919  ETH <11   Ischemic Hypercoagulable & Vasculitic Workup Normal - RPR, ESR, HIV Pending - C3, C4, CH50, ANA, lupus anticoagulant, cardiolipin antibody, homocysteine, prothrombin gene mutation, ANCA screen, SSA & SSB   Mr Brain Wo Contrast 04/29/2014    1. Acute right PCA territory infarct, patchy involvement most pronounced in the right para hippocampal gyrus. Mild edema. No associated hemorrhage or mass effect. 2. Extensive chronic left PCA infarct with encephalomalacia and Wallerian degeneration. Chronic left cerebellar infarct. 3. 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   [name] Head Wo  Contrast 04/30/2014    1. Anatomic variation of the right PCA supply, with "duplicated" fetal PCAs such that the inferior and superior PCA divisions are supplied by separate right posterior communicating arteries. 2. The inferior PCA division is occluded in the "P2" equivalent segment. The superior PCA division remains normal. 3. Chronic occlusion of the left PCA. 4. Interval improved distal left vertebral artery, such that that vessel now appears dominant. 5. Stable anterior circulation with chronic moderate stenosis at the origin of a left MCA M2 branch.      Carotid Doppler  Findings suggest 1-39% internal carotid artery stenosis bilaterally. The left vertebral artery is patent with antegrade flow, unable to visualize the right vertebral artery.  2D echo: Left ventricle: The cavity size was normal. There was mild concentric hypertrophy. Systolic function was normal. The estimated ejection fraction was in the range of 55% to 60%. Although no diagnostic regional wall motion abnormality was identified, this possibility cannot be completely excluded on the basis of this study. Doppler parameters are consistent with abnormal left ventricular relaxation (grade 1 diastolic dysfunction).  CT angio neck  Mild atherosclerotic disease involving the right carotid bulb and distal left common carotid artery without significant carotid stenosis. Both vertebral arteries are patent to the basilar without stenosis.   TEE  No LAA thrombus Small bidirectional PFO. LVEF 50-55%  Venous doppler - no evidence of DVT.  LDL 136 and A1C 11.3   PHYSICAL EXAM Exam: Gen: NAD Eyes: anicteric sclerae, moist conjunctivae                    CV: RRR, no MRG Neuro: Mental Status: Alert, awake following commands. Note oriented to date or year or president Speech:    dysarthria, impaired naming, but preserved repetition, no aphasia Cranial Nerves:    The pupils are equal, round, and reactive to light. Fundi flat. EOMI,  difficulty with finger counting right hemifields, Right facial droop. Hoarse voice with dysarthria.  Motor Observation:    no involuntary movements noted. Increased tone right arm (chronic from previous stroke) Sensory: decreased right extremities and face Strength: Weak right arm, 2+ Cortical Sensory Modalities:     right visual neglect, extinction on right   ASSESSMENT/PLAN Adam Callahan is an 53 y.o. male with a past medical history significant for DM, PCA and PICA distribution infarcts with residual right hemiparesis and dysarthria who was brought to the ED due to right-sided vision changes. not compliant with medications, stopping them several days previous. Denies drug use. MRi showed acute right PCA territory infarct, patchy involvement most pronounced in the right para hippocampal gyrus. Mild edema. No associated hemorrhage or mass effect. Also showed extensive chronic left PCA infarct with encephalomalacia and Wallerian degeneration and a chronic left cerebellar infarct.  Stroke - acute R PCA infarct in  the setting of R VA stenosis, fetal R PCA and medical non-compliance. Likely large vessel atherosclerosis given uncontrolled risk factor, medication non-compliance and multiple vessel stenosis on images.  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  - chronic L cerebellar infarct  CT angio neck - atherosclerosis without significant stenosis  Carotid Doppler  No significant  stenosis.   2D Echo  No source of embolus  TEE small PFO. PFO (patent foramen ovale) likely an incidental finding.   LE venous dopplers for DVT was negative.  Hyperlipidemia. LDL 136, goal < 70, on statin now  Diabetes type II. HgbA1c 11.3, uncontrolled  Suppose to be on ASA and Plavix prior to admission, but he is non-compliance due to financial difficulty. dural antiplatelet continued in hospital. Would continue dual antiplatelets x 3 months then one  alone.   Patient counseled to be compliant with his antithrombotic medications  Need aggressive risk factor control. He needs financial assistance to afford his medications.     hypercoagulable tests thus far negative. (lupus anticoagulant, cardiolipin antibody, prothrombin G20120A, homocysteine, C3, C4, CH50, ANCA screen w/ reflex titer, Sjogren's Syndrome Antibodies [SSA & SSB] pending)  No indication for loop given young age and lack of atrial fibrillation type symptoms.  CIR recommended. Admissions coordinator following for insurance approval.                 Diabetes, uncontrolled - A1C 11.3 - on insulin - SSI - need aggressive modificatoin to achieve goal of A1C < 6.5  Hyperlipidemia - LDL 136 - on lipitor - continue on discharge.  Other pertinent problem - pt non compliant with medication at home due to financial issue. Social worker consult requested for financial support.  Hospital day # 4  Burnetta Sabin, MSN, RN, ANVP-BC, ANP-BC, [facility] Pager: [phone] 05/03/2014 11:26 AM   I, the attending vascular neurologist, have personally obtained a history, examined the patient, evaluated laboratory data, individually viewed imaging studies, and formulated the assessment and plan of care.  I have made any additions or clarifications directly to the above note and agree with the findings and plan as currently documented.   Neurology will sign off. Please call with questions. Pt will follow up with Dr. Erlinda Hong at [facility] in about 2 months. Thanks for the consult.  Rosalin Hawking, MD PhD Stroke Neurology 05/03/2014 6:11 PM    To contact Stroke Continuity provider, please refer to [email or web address] After hours, contact General Neurology

## 2014-05-03 NOTE — Progress Notes (Signed)
  Cancellation note Speech Pathology  Pt currently in endoscopy for TEE.  SLP will f/u 10/14.

## 2014-05-03 NOTE — Care Management Note (Signed)
    Page 1 of 1   05/04/2014     11:03:09 AM CARE MANAGEMENT NOTE 05/04/2014  Patient:  HIREN, PEPLINSKI   Account Number:  [id number]  Date Initiated:  05/03/2014  Documentation initiated by:  GRAVES-BIGELOW,Corah Willeford  Subjective/Objective Assessment:   Pt admitted for positive stroke.     Action/Plan:   CIR to consult for possible admission with SNF back up. CM will continue to monitor. Pt has insurance, therefore will not be able to assist with medications at this time.   Anticipated DC Date:  05/04/2014   Anticipated DC Plan:  IP REHAB FACILITY      DC Planning Services  CM consult      Choice offered to / List presented to:             Status of service:  Completed, signed off Medicare Important Message given?  NO (If response is "NO", the following Medicare IM given date fields will be blank) Date Medicare IM given:   Medicare IM given by:   Date Additional Medicare IM given:   Additional Medicare IM given by:    Discharge Disposition:  IP REHAB FACILITY  Per UR Regulation:  Reviewed for med. necessity/level of care/duration of stay  If discussed at [place] of Stay Meetings, dates discussed:    Comments:  05-04-14 [place], [place] [phone] Plan for Admission to CIR. No further needs from CM at this time.

## 2014-05-03 NOTE — Anesthesia Postprocedure Evaluation (Signed)
  Anesthesia Post-op Note  Patient: Adam Callahan  Procedure(s) Performed: Procedure(s): TRANSESOPHAGEAL ECHOCARDIOGRAM (TEE) (N/A)  Patient Location: Endo  Anesthesia Type:General  Level of Consciousness: awake  Airway and Oxygen Therapy: Patient Spontanous Breathing  Post-op Pain: mild  Post-op Assessment: Post-op Vital signs reviewed  Post-op Vital Signs: Reviewed  Last Vitals:  Filed Vitals:   05/03/14 1015  BP: 153/101  Pulse: 101  Temp: 35.8 C  Resp: 14    Complications: No apparent anesthesia complications

## 2014-05-03 NOTE — Progress Notes (Addendum)
Inpatient Rehabilitation  I met with Adam Callahan a the bedside to discuss post acute rehab options including SNF and CIR.  He requests possible CIR admission. I will initiate insurance authorization process.  I do not anticipate I would hear back from insurance company today, however I likely  have no bed availability for today  in the event of a quick decision.  My co-worker Danne Baxter will follow up tomorrow in my absence and can be reached at [phone].  Please call if questions.  [place] Admissions Coordinator Cell [phone] Office [phone]

## 2014-05-03 NOTE — Progress Notes (Signed)
*  PRELIMINARY RESULTS* Vascular Ultrasound Lower extremity venous duplex has been completed.  Preliminary findings: no evidence of DVT.  Landry Mellow, RDMS, RVT  05/03/2014, 4:45 PM

## 2014-05-03 NOTE — CV Procedure (Signed)
TRANSESOPHAGEAL ECHOCARDIOGRAM (TEE) NOTE  INDICATIONS: stroke  PROCEDURE:   Informed consent was obtained prior to the procedure. The risks, benefits and alternatives for the procedure were discussed and the patient comprehended these risks.  Risks include, but are not limited to, cough, sore throat, vomiting, nausea, somnolence, esophageal and stomach trauma or perforation, bleeding, low blood pressure, aspiration, pneumonia, infection, trauma to the teeth and death.    After a procedural time-out, the patient was given 4 mg versed and 100 mcg fentanyl for moderate sedation.  The oropharynx was anesthetized 10 cc of topical 1% viscous lidocaine and 1 cetacaine spray.  The patient was difficult to achieve moderate sedation - he was noted to have periods of apnea, but then became awake when stimulated with the probe.  The procedure was then paused for airway management. At this point, it was determined that general anesthesia would be the safest route for the procedure. Anesthesia induced and maintained the airway. Please see their note.  The transesophageal probe was then inserted in the esophagus and stomach without difficulty and multiple views were obtained.  The patient was kept under observation until the patient left the procedure room.  The patient left the procedure room in stable condition.   Agitated microbubble saline contrast was administered.  COMPLICATIONS:    There were no immediate complications.  Findings:  1. LEFT VENTRICLE: The left ventricular wall thickness is mildly increased.  The left ventricular cavity is normal in size. Wall motion is normal.  LVEF is 50-55%.  2. RIGHT VENTRICLE:  The right ventricle is normal in structure and function without any thrombus or masses.    3. LEFT ATRIUM:  The left atrium is dilated in size without any thrombus or masses.  There is not spontaneous echo contrast ("smoke") in the left atrium consistent with a low flow  state.  4. LEFT ATRIAL APPENDAGE:  The left atrial appendage is free of any thrombus or masses. The appendage has single lobes. Pulse doppler indicates moderate flow in the appendage.  5. ATRIAL SEPTUM:  The atrial septum is aneurysmal. There is a small bidirectional PFO noted by color doppler and saline microbubble.  6. RIGHT ATRIUM:  The right atrium is normal in size and function without any thrombus or masses.  7. MITRAL VALVE:  The mitral valve is normal in structure and function with Mild regurgitation.  There were no vegetations or stenosis.  8. AORTIC VALVE:  The aortic valve is trileaflet, normal in structure and function with no regurgitation.  There were no vegetations or stenosis  9. TRICUSPID VALVE:  The tricuspid valve is normal in structure and function with Mild regurgitation.  There were no vegetations or stenosis  10.  PULMONIC VALVE:  The pulmonic valve is normal in structure and function with trace regurgitation.  There were no vegetations or stenosis.   11. AORTIC ARCH, ASCENDING AND DESCENDING AORTA:  There was grade 1 Ron Parker et. Al, 1992) atherosclerosis of the aortic arch and proximal descending aorta.  12. PULMONARY VEINS: Anomalous pulmonary venous return was not noted.  13. PERICARDIUM: The pericardium appeared normal and non-thickened.  There is no pericardial effusion.  IMPRESSION:   1. No LAA thrombus 2. Small bidirectional PFO. 3. LVEF 50-55%  RECOMMENDATIONS:    1.  Further management of stroke as per neurology. LE venous dopplers may be indicated.  Time Spent Directly with the Patient:  60 minutes   Pixie Casino, MD, [facility] Attending Cardiologist [facility]  05/03/2014,  10:10 AM

## 2014-05-03 NOTE — Anesthesia Preprocedure Evaluation (Addendum)
Anesthesia Evaluation  Patient identified by MRN, date of birth, ID band Patient confused    Reviewed: Allergy & Precautions, H&P , NPO status , Patient's Chart, lab work & pertinent test results  Airway Mallampati: III TM Distance: >3 FB Neck ROM: Limited    Dental  (+) Teeth Intact, Poor Dentition, Dental Advisory Given   Pulmonary former smoker,          Cardiovascular hypertension,     Neuro/Psych    GI/Hepatic   Endo/Other  diabetes, Type 2, Oral Hypoglycemic Agents  Renal/GU      Musculoskeletal   Abdominal   Peds  Hematology   Anesthesia Other Findings   Reproductive/Obstetrics                         Anesthesia Physical Anesthesia Plan  ASA: III  Anesthesia Plan: General   Post-op Pain Management:    Induction: Intravenous  Airway Management Planned: Oral ETT  Additional Equipment: None  Intra-op Plan:   Post-operative Plan: Extubation in OR  Informed Consent: I have reviewed the patients History and Physical, chart, labs and discussed the procedure including the risks, benefits and alternatives for the proposed anesthesia with the patient or authorized representative who has indicated his/her understanding and acceptance.   Dental advisory given  Plan Discussed with: CRNA, Anesthesiologist and Surgeon  Anesthesia Plan Comments:         Anesthesia Quick Evaluation

## 2014-05-03 NOTE — Progress Notes (Signed)
Physical Therapy Treatment Patient Details Name: Adam Callahan MRN: [id number] DOB: 03/17/1961 Today's Date: 05/03/2014    History of Present Illness Pt admitted for R PCA acute infarct.  PMHx consists of L PCA CVA.    PT Comments    Progressing as expected given his ataxia.  General mobility mildly unsteady, with worsening gait instability as distance increases.  Emphasis placed on transitions from static to dynamic stability.  Follow Up Recommendations  CIR;Supervision/Assistance - 24 hour     Equipment Recommendations  None recommended by PT    Recommendations for Other Services Rehab consult     Precautions / Restrictions      Mobility  Bed Mobility Overal bed mobility: Needs Assistance Bed Mobility: Supine to Sit     Supine to sit: Min guard     General bed mobility comments: up clumsily, but without assist  Transfers Overall transfer level: Needs assistance Equipment used: 1 person hand held assist Transfers: Sit to/from Stand Sit to Stand: Min assist         General transfer comment: assist to keep patient coming forward over BOS  Ambulation/Gait Ambulation/Gait assistance: Min assist;+2 safety/equipment Ambulation Distance (Feet): 175 Feet Assistive device: 2 person hand held assist Gait Pattern/deviations: Step-through pattern;Decreased step length - left;Decreased stance time - right;Ataxic;Narrow base of support Gait velocity: decreased   General Gait Details: short step length, midly ataxic on the R > L;L hip drop during L LE swing through difficult to control.  Needed w/shift assist.   Stairs            Wheelchair Mobility    Modified Rankin (Stroke Patients Only) Modified Rankin (Stroke Patients Only) Modified Rankin: Moderately severe disability     Balance Overall balance assessment: Needs assistance   Sitting balance-Leahy Scale: Good     Standing balance support: Bilateral upper extremity supported Standing  balance-Leahy Scale: Fair Standing balance comment: During regroup of posture and balance, pt able to stand unassisted once legs spread apart.  Pt able to scan through ~200 arc with trunk rotation without LOB>                    Cognition Arousal/Alertness: Awake/alert Behavior During Therapy: WFL for tasks assessed/performed Overall Cognitive Status: Difficult to assess                      Exercises      General Comments        Pertinent Vitals/Pain Pain Assessment: No/denies pain    Home Living Family/patient expects to be discharged to:: Private residence                    Prior Function            PT Goals (current goals can now be found in the care plan section) Acute Rehab PT Goals Patient Stated Goal: get better PT Goal Formulation: With patient Time For Goal Achievement: 05/15/14 Potential to Achieve Goals: Good Progress towards PT goals: Progressing toward goals    Frequency  Min 4X/week    PT Plan Current plan remains appropriate    Co-evaluation             End of Session Equipment Utilized During Treatment: Gait belt Activity Tolerance: Patient tolerated treatment well Patient left: in chair;with call bell/phone within reach     Time: 1210-1233 PT Time Calculation (min): 23 min  Charges:  $Gait Training: 8-22 mins $Therapeutic Activity: 8-22 mins  G Codes:      Tashara Suder, Tessie Fass 05/03/2014, 4:16 PM  05/03/2014  Donnella Sham, [place] [phone]  (pager)

## 2014-05-03 NOTE — Progress Notes (Addendum)
[facility] PROGRESS NOTE  Adam Callahan BTD:[id number] DOB: 05-10-1961 DOA: 04/29/2014 PCP: No PCP Per Patient  Assessment/Plan: Principal Problem:   CVA (cerebral infarction) Active Problems:   Diabetes mellitus type 2, uncontrolled   Dyslipidemia   Spastic hemiplegia affecting dominant side   Stroke   Stroke: He developed new stroke on the R PCA area as evidenced by MRI findings. Neurology was consulted. Dr. Armida Sans evaluated the patient to suggest stroke work up.  -will Admit to tele bed  - follow dr. Armida Sans recommendation as follows:  1. HgbA1c 11.3 , fasting lipid panel -LDL 136, start statin  2. CT angio neck - atherosclerosis without significant stenosis 3. Echocardiogram EF: 50% - 55%,Moderate LVH with LVEF 36-46%, grade 1 diastolic dysfunction. Trivial mitral and tricuspid regurgitation. Unable to estimate PASP. No obvious PFO or ASD.  TEE shows no fracture thrombus, small bidirectional PFO, LVEF 50-55%, venous Dopplers pending 4. Carotid dopplers 1-39% internal carotid artery stenosis bilaterally  5. Prophylactic therapy-aspirin and plavix ,. dural antiplatelet continued in hospital. Would continue dual antiplatelets x 3 months then one alone.  6. Risk factor modification  7. Telemetry monitoring  8. Frequent neuro checks  9. PT/OT SLP- CIR following for insurance approval  Neurology recommended Given his young age, to check hypercoagulable tests (lupus anticoagulant, cardiolipin antibody, prothrombin G20120A, homocysteine) and vasculitic labs (C3, C4, CH50, ANA, ESR, ANCA screen w/ reflex titer, Sjogren's Syndrome Antibodies [SSA & SSB]) as well as HIV and RPR as possible stroke sources. No indication for loop given young age and lack of atrial fibrillation type symptoms    Dm-II: Hemoglobin A1c 11.3. He is not compliant with his insulin. He supposed to take Lantus 25 units daily  Currently on a carb modified diet  Increase Lantus to 20 units  - Hemoglobin  A1c 11.3  NovoLog 4 units 3 times a day with meals added    HLD: continue Lipitor and check Lipid panel   HPI/Subjective: Better no complaints  Objective: Filed Vitals:   05/03/14 1045 05/03/14 1050 05/03/14 1055 05/03/14 1100  BP:  153/90  149/90  Pulse: 93 91 90 88  Temp:      TempSrc:      Resp: 16 12 9 17   Height:      Weight:      SpO2: 99% 100% 98% 100%    Intake/Output Summary (Last 24 hours) at 05/03/14 1137 Last data filed at 05/03/14 1012  Gross per 24 hour  Intake    400 ml  Output    800 ml  Net   -400 ml    Exam:  General: alert & oriented x 3 In NAD  Cardiovascular: RRR, nl S1 s2  Respiratory: Decreased breath sounds at the bases, scattered rhonchi, no crackles  Abdomen: soft +BS NT/ND, no masses palpable  Extremities: No cyanosis and no edema Neurologic nonfocal      Data Reviewed: Basic Metabolic Panel:  Recent Labs Lab 04/27/14 1857 04/29/14 1911 04/30/14 0030 05/02/14 0405  NA 136* 136* 137 139  K 3.9 3.6* 3.5* 3.3*  CL 97 99 97 99  CO2 26  --  23 27  GLUCOSE 350* 250* 180* 147*  BUN 7 4* 6 12  CREATININE 0.60 0.60 0.62 0.75  CALCIUM 9.0  --  9.2 9.0    Liver Function Tests:  Recent Labs Lab 04/27/14 1857 05/02/14 0405  AST 12 15  ALT 20 12  ALKPHOS 98 76  BILITOT 0.5 0.7  PROT 7.7 7.4  ALBUMIN 3.7 3.5   No results found for this basename: LIPASE, AMYLASE,  in the last 168 hours No results found for this basename: AMMONIA,  in the last 168 hours  CBC:  Recent Labs Lab 04/27/14 1857 04/29/14 1911 04/30/14 0030  WBC 5.2  --  5.4  NEUTROABS 2.9  --   --   HGB 13.6 15.3 14.1  HCT 39.4 45.0 40.0  MCV 82.4  --  82.3  PLT 255  --  280    Cardiac Enzymes: No results found for this basename: CKTOTAL, CKMB, CKMBINDEX, TROPONINI,  in the last 168 hours BNP (last 3 results) No results found for this basename: PROBNP,  in the last 8760 hours   CBG:  Recent Labs Lab 05/02/14 0734 05/02/14 1205 05/02/14 1700  05/02/14 2125 05/03/14 0746  GLUCAP 155* 214* 161* 125* 149*    No results found for this or any previous visit (from the past 240 hour(s)).   Studies: Ct Head Wo Contrast  04/27/2014   CLINICAL DATA:  Onset of blurry vision in the right high at 5 p.m. this same day. Initial encounter.  EXAM: CT HEAD WITHOUT CONTRAST  TECHNIQUE: Contiguous axial images were obtained from the base of the skull through the vertex without intravenous contrast.  COMPARISON:  Brain MRI 06/07/2013 and head CT scan 06/06/2013.  FINDINGS: Remote left cerebellar, thalamus and PCA territory infarcts are again seen. No evidence of acute intracranial abnormality including hemorrhage, infarct, mass lesion, mass effect, midline shift or abnormal extra-axial fluid collection is identified. No hydrocephalus or pneumocephalus. The calvarium is intact. Imaged paranasal sinuses and mastoid air cells are clear.  IMPRESSION: No acute finding in patient with remote infarcts as above.   Electronically Signed   By: Inge Rise M.D.   On: 04/27/2014 20:14   Ct Angio Neck W/cm &/or Wo/cm  05/02/2014   CLINICAL DATA:  Stroke.  EXAM: CT ANGIOGRAPHY NECK  TECHNIQUE: Multidetector CT imaging of the neck was performed using the standard protocol during bolus administration of intravenous contrast. Multiplanar CT image reconstructions and MIPs were obtained to evaluate the vascular anatomy. Carotid stenosis measurements (when applicable) are obtained utilizing NASCET criteria, using the distal internal carotid diameter as the denominator.  CONTRAST:  48m OMNIPAQUE IOHEXOL 350 MG/ML SOLN  COMPARISON:  MRI MRA of the head 04/30/2014  FINDINGS: Lung apices are clear.  No mass or adenopathy in the neck.  Two vessel aortic arch. Left carotid origin from the innominate artery, a normal variant. Proximal great vessels appear normal without significant atherosclerotic disease.  Right carotid: Right common carotid artery widely patent. Noncalcified low  density plaque is seen in the right carotid bulb not causing significant stenosis. Remainder of the internal carotid artery widely patent. Right external carotid widely patent.  Left carotid: Left common carotid artery widely patent with mild noncalcified plaque in the distal common carotid artery. Left carotid bifurcation widely patent. Left internal carotid artery widely patent without stenosis.  Vertebral arteries: Left vertebral artery is slightly larger than the right. Both vertebral arteries are widely patent without stenosis or dissection. Both vertebral arteries contribute to the basilar.  Review of the MIP images confirms the above findings.  IMPRESSION: Mild atherosclerotic disease involving the right carotid bulb and distal left common carotid artery without significant carotid stenosis. Both vertebral arteries are patent to the basilar without stenosis.   Electronically Signed   By: CFranchot GalloM.D.   On: 05/02/2014 16:19   Mr MHal Neer  Contrast  04/30/2014   CLINICAL DATA:  53 year old male with acute blurred vision and right facial pain diagnosed with right PCA infarct yesterday by MRI. Initial encounter.  EXAM: MRA HEAD WITHOUT CONTRAST  TECHNIQUE: Angiographic images of the Circle of Willis were obtained using MRA technique without intravenous contrast.  COMPARISON:  Brain MRI 11/27/2013. Neck and intracranial MRA 06/07/2013.  FINDINGS: Improved antegrade flow in the distal left vertebral artery, which now appears dominant compared to that on the right. The distal right vertebral functionally terminates in PICA as before. Improved flow at the vertebrobasilar junction. Patent AICA origins. No basilar artery stenosis. SCA origins remain patent, the right now appears irregular.  The left PCA remains occluded. Fetal type right PCA origin AND duplicated right posterior communicating artery is re - identified (separate supply of the right superior and inferior PCA territories. Both right posterior  communicating artery are patent. The superior right PCA division is patent. However, the inferior division branch (supplied by the right Pcomm which has a more cephalad coarse) now is occluded in the equivalent of the right P2 segment (arrow on series 204, image 19).  Antegrade flow in both ICA siphons which are stable and without stenosis. Ophthalmic and right posterior communicating artery origins remain normal. Patent carotid termini. MCA and ACA origins are within normal limits. Anterior communicating artery with median artery of the corpus callosum, and visualized bilateral ACA branches are stable and within normal limits (degraded by motion today). Visualized bilateral MCA branches are stable, with chronic moderate irregularity and stenosis at the origin of the middle sylvian division as seen in 2014. No major MCA branch occlusion.  IMPRESSION: 1. Anatomic variation of the right PCA supply, with "duplicated" fetal PCAs such that the inferior and superior PCA divisions are supplied by separate right posterior communicating arteries. 2. The inferior PCA division is occluded in the "P2" equivalent segment. The superior PCA division remains normal. 3. Chronic occlusion of the left PCA. 4. Interval improved distal left vertebral artery, such that that vessel now appears dominant. 5. Stable anterior circulation with chronic moderate stenosis at the origin of a left MCA M2 branch.   Electronically Signed   By: Lars Pinks M.D.   On: 04/30/2014 15:02   Mr Brain Wo Contrast  04/29/2014   CLINICAL DATA:  53 year old male with acute blurred vision and right facial pain. Initial encounter. History of prior strokes, including left PICA infarct in 2014.  EXAM: MRI HEAD WITHOUT CONTRAST  TECHNIQUE: Multiplanar, multiecho pulse sequences of the brain and surrounding structures were obtained without intravenous contrast.  COMPARISON:  Brain MRI 06/07/2013.  FINDINGS: Interval expected evolution of the extensive and partially  hemorrhagic left PCA infarct, which included involvement of the left thalamus. Wallerian degeneration. Ex vacuo changes to the left occipital and temporal horns. Chronic inferior left cerebellar infarct.  Major intracranial vascular flow voids are stable.  Multifocal restricted diffusion in the right PCA territory, including the occipital pole, central occipital white matter, as well as the posterior and mesial right temporal lobe. The right hippocampus is spared. Very hippocampal gyrus is significantly affected. No evidence of associated hemorrhage at this time. Early T2 and FLAIR hyperintensity in the affected areas.  Susceptibility artifact suspected on diffusion weighted imaging in the left PCA territory. No deep gray matter, brainstem, or cerebellar restricted diffusion. No associated mass effect. Elsewhere scattered cerebral white matter T2 and FLAIR hyperintensity is stable to mildly progressed. No ventriculomegaly or midline shift. Negative pituitary, cervicomedullary junction, and upper  cervical spine.  Visible internal auditory structures appear normal. Rightward gaze deviation. Visualized orbit soft tissues are within normal limits. Stable paranasal sinuses and mastoids. Stable scalp soft tissues. Normal bone marrow signal.  There is a C4 level disc herniation resulting in degenerative spinal stenosis which appears chronic.  IMPRESSION: 1. Acute right PCA territory infarct, patchy involvement most pronounced in the right para hippocampal gyrus. Mild edema. No associated hemorrhage or mass effect. 2. Extensive chronic left PCA infarct with encephalomalacia and Wallerian degeneration. Chronic left cerebellar infarct. 3. 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   Electronically Signed   By: Lars Pinks M.D.   On: 04/29/2014 21:44    Scheduled Meds: . [facility] HOLD] aspirin EC  81 mg Oral Daily  . [facility] HOLD] atorvastatin  20 mg Oral q1800  . [facility] HOLD] clopidogrel  75  mg Oral Q breakfast  . [[facility] HOLD] heparin  5,000 Units Subcutaneous 3 times per day  . [[facility] HOLD] insulin aspart  0-9 Units Subcutaneous TID WC  . [[facility] HOLD] insulin glargine  20 Units Subcutaneous Daily   Continuous Infusions:   Principal Problem:   CVA (cerebral infarction) Active Problems:   Diabetes mellitus type 2, uncontrolled   Dyslipidemia   Spastic hemiplegia affecting dominant side   Stroke    Time spent: 40 minutes   [facility] Pager [phone]. If 7PM-7AM, please contact night-coverage at [email or web address], password [facility] 05/03/2014, 11:37 AM  LOS: 4 days

## 2014-05-03 NOTE — Anesthesia Procedure Notes (Signed)
Procedure Name: Intubation Date/Time: 05/03/2014 9:44 AM Performed by: Melina Copa, Pilar Corrales R Pre-anesthesia Checklist: Patient identified, Emergency Drugs available, Suction available, Patient being monitored and Timeout performed Patient Re-evaluated:Patient Re-evaluated prior to inductionOxygen Delivery Method: Circle system utilized Preoxygenation: Pre-oxygenation with 100% oxygen Intubation Type: IV induction Ventilation: Mask ventilation without difficulty Laryngoscope Size: Mac and 3 Tube type: Oral Tube size: 8.0 mm Airway Equipment and Method: Stylet,  Rigid stylet and Video-laryngoscopy (Grade IV view with Mac 3, Grade III with [place] 4, esophageal intubation immediately recognized and ETT out, Easy mask ventilation, Glide scope with Grade I view, ETT through VC.) Placement Confirmation: ETT inserted through vocal cords under direct vision,  positive ETCO2 and breath sounds checked- equal and bilateral Secured at: 22 cm Tube secured with: Tape Dental Injury: Teeth and Oropharynx as per pre-operative assessment  Difficulty Due To: Difficulty was anticipated, Difficult Airway- due to anterior larynx, Difficult Airway- due to limited oral opening, Difficult Airway- due to reduced neck mobility and Difficult Airway- due to large tongue Future Recommendations: Recommend- induction with short-acting agent, and alternative techniques readily available

## 2014-05-04 ENCOUNTER — Inpatient Hospital Stay (HOSPITAL_COMMUNITY)
Admission: RE | Admit: 2014-05-04 | Discharge: 2014-05-17 | DRG: 057 | Disposition: A | Discharge status: Home / Self Care | Payer: BC Managed Care – PPO | Source: Intra-hospital | Attending: Physical Medicine & Rehabilitation | Admitting: Physical Medicine & Rehabilitation

## 2014-05-04 ENCOUNTER — Encounter (HOSPITAL_COMMUNITY): Payer: Self-pay | Admitting: Internal Medicine

## 2014-05-04 DIAGNOSIS — I739 Peripheral vascular disease, unspecified: Secondary | ICD-10-CM | POA: Diagnosis present

## 2014-05-04 DIAGNOSIS — I69920 Aphasia following unspecified cerebrovascular disease: Secondary | ICD-10-CM

## 2014-05-04 DIAGNOSIS — I6931 Cognitive deficits following cerebral infarction: Secondary | ICD-10-CM

## 2014-05-04 DIAGNOSIS — I6340 Cerebral infarction due to embolism of unspecified cerebral artery: Secondary | ICD-10-CM

## 2014-05-04 DIAGNOSIS — I639 Cerebral infarction, unspecified: Secondary | ICD-10-CM

## 2014-05-04 DIAGNOSIS — Z599 Problem related to housing and economic circumstances, unspecified: Secondary | ICD-10-CM

## 2014-05-04 DIAGNOSIS — R209 Unspecified disturbances of skin sensation: Secondary | ICD-10-CM

## 2014-05-04 DIAGNOSIS — E785 Hyperlipidemia, unspecified: Secondary | ICD-10-CM | POA: Diagnosis present

## 2014-05-04 DIAGNOSIS — I69351 Hemiplegia and hemiparesis following cerebral infarction affecting right dominant side: Principal | ICD-10-CM

## 2014-05-04 DIAGNOSIS — E1142 Type 2 diabetes mellitus with diabetic polyneuropathy: Secondary | ICD-10-CM | POA: Diagnosis present

## 2014-05-04 DIAGNOSIS — I69998 Other sequelae following unspecified cerebrovascular disease: Secondary | ICD-10-CM

## 2014-05-04 DIAGNOSIS — I69320 Aphasia following cerebral infarction: Secondary | ICD-10-CM

## 2014-05-04 DIAGNOSIS — I10 Essential (primary) hypertension: Secondary | ICD-10-CM | POA: Diagnosis present

## 2014-05-04 DIAGNOSIS — I69393 Ataxia following cerebral infarction: Secondary | ICD-10-CM

## 2014-05-04 DIAGNOSIS — Z9114 Patient's other noncompliance with medication regimen: Secondary | ICD-10-CM | POA: Diagnosis present

## 2014-05-04 DIAGNOSIS — K5900 Constipation, unspecified: Secondary | ICD-10-CM | POA: Diagnosis present

## 2014-05-04 DIAGNOSIS — IMO0002 Reserved for concepts with insufficient information to code with codable children: Secondary | ICD-10-CM

## 2014-05-04 DIAGNOSIS — I69392 Facial weakness following cerebral infarction: Secondary | ICD-10-CM

## 2014-05-04 DIAGNOSIS — G8110 Spastic hemiplegia affecting unspecified side: Secondary | ICD-10-CM

## 2014-05-04 DIAGNOSIS — G8194 Hemiplegia, unspecified affecting left nondominant side: Secondary | ICD-10-CM

## 2014-05-04 DIAGNOSIS — E1165 Type 2 diabetes mellitus with hyperglycemia: Secondary | ICD-10-CM

## 2014-05-04 DIAGNOSIS — G8190 Hemiplegia, unspecified affecting unspecified side: Secondary | ICD-10-CM

## 2014-05-04 DIAGNOSIS — Z87891 Personal history of nicotine dependence: Secondary | ICD-10-CM

## 2014-05-04 LAB — CBC
HEMATOCRIT: 37.9 % — AB (ref 39.0–52.0)
Hemoglobin: 12.9 g/dL — ABNORMAL LOW (ref 13.0–17.0)
MCH: 28.4 pg (ref 26.0–34.0)
MCHC: 34 g/dL (ref 30.0–36.0)
MCV: 83.5 fL (ref 78.0–100.0)
PLATELETS: 297 10*3/uL (ref 150–400)
RBC: 4.54 MIL/uL (ref 4.22–5.81)
RDW: 12.6 % (ref 11.5–15.5)
WBC: 6.1 10*3/uL (ref 4.0–10.5)

## 2014-05-04 LAB — GLUCOSE, CAPILLARY
GLUCOSE-CAPILLARY: 154 mg/dL — AB (ref 70–99)
GLUCOSE-CAPILLARY: 114 mg/dL — AB (ref 70–99)
Glucose-Capillary: 125 mg/dL — ABNORMAL HIGH (ref 70–99)
Glucose-Capillary: 136 mg/dL — ABNORMAL HIGH (ref 70–99)

## 2014-05-04 LAB — COMPREHENSIVE METABOLIC PANEL
ALBUMIN: 3.3 g/dL — AB (ref 3.5–5.2)
ALT: 15 U/L (ref 0–53)
AST: 16 U/L (ref 0–37)
Alkaline Phosphatase: 73 U/L (ref 39–117)
Anion gap: 12 (ref 5–15)
BUN: 7 mg/dL (ref 6–23)
CHLORIDE: 101 meq/L (ref 96–112)
CO2: 27 mEq/L (ref 19–32)
Calcium: 9 mg/dL (ref 8.4–10.5)
Creatinine, Ser: 0.77 mg/dL (ref 0.50–1.35)
GFR calc Af Amer: >90 mL/min (ref >90)
Glucose, Bld: 174 mg/dL — ABNORMAL HIGH (ref 70–99)
Potassium: 3.5 mEq/L — ABNORMAL LOW (ref 3.7–5.3)
Sodium: 140 mEq/L (ref 137–147)
Total Bilirubin: 0.4 mg/dL (ref 0.3–1.2)
Total Protein: 6.9 g/dL (ref 6.0–8.3)

## 2014-05-04 MED ORDER — ASPIRIN EC 81 MG PO TBEC
81.0000 mg | DELAYED_RELEASE_TABLET | Freq: Every day | ORAL | Status: DC
  Administered 2014-05-04 – 2014-05-17 (×14): 81 mg via ORAL
  Filled 2014-05-04 (×15): qty 1

## 2014-05-04 MED ORDER — TRAZODONE HCL 50 MG PO TABS
50.0000 mg | ORAL_TABLET | Freq: Every evening | ORAL | Status: DC | PRN
Start: 2014-05-04 — End: 2014-05-17
  Administered 2014-05-07 – 2014-05-13 (×4): 50 mg via ORAL
  Filled 2014-05-04 (×4): qty 1

## 2014-05-04 MED ORDER — SENNOSIDES-DOCUSATE SODIUM 8.6-50 MG PO TABS
1.0000 | ORAL_TABLET | Freq: Every evening | ORAL | Status: DC | PRN
  Administered 2014-05-07: 1 via ORAL
  Filled 2014-05-04: qty 1

## 2014-05-04 MED ORDER — ATORVASTATIN CALCIUM 20 MG PO TABS
20.0000 mg | ORAL_TABLET | Freq: Every day | ORAL | Status: DC
  Administered 2014-05-05 – 2014-05-16 (×13): 20 mg via ORAL
  Filled 2014-05-04 (×15): qty 1

## 2014-05-04 MED ORDER — SORBITOL 70 % SOLN
30.0000 mL | Freq: Every day | Status: DC | PRN
  Administered 2014-05-05 – 2014-05-15 (×3): 30 mL via ORAL
  Filled 2014-05-04 (×3): qty 30

## 2014-05-04 MED ORDER — INSULIN GLARGINE 100 UNIT/ML ~~LOC~~ SOLN: 20.0000 [IU] | Freq: Every day | SUBCUTANEOUS | Status: DC

## 2014-05-04 MED ORDER — CLOPIDOGREL BISULFATE 75 MG PO TABS
75.0000 mg | ORAL_TABLET | Freq: Every day | ORAL | Status: DC
  Administered 2014-05-05 – 2014-05-17 (×13): 75 mg via ORAL
  Filled 2014-05-04 (×15): qty 1

## 2014-05-04 MED ORDER — TRAMADOL HCL 50 MG PO TABS: 50.0000 mg | ORAL_TABLET | Freq: Four times a day (QID) | ORAL | Status: DC | PRN

## 2014-05-04 MED ORDER — ACETAMINOPHEN 325 MG PO TABS
325.0000 mg | ORAL_TABLET | ORAL | Status: DC | PRN
Start: 2014-05-04 — End: 2014-05-17

## 2014-05-04 MED ORDER — ONDANSETRON HCL 4 MG/2ML IJ SOLN: 4.0000 mg | Freq: Four times a day (QID) | INTRAMUSCULAR | Status: DC | PRN

## 2014-05-04 MED ORDER — INSULIN ASPART 100 UNIT/ML ~~LOC~~ SOLN: 4.0000 [IU] | Freq: Three times a day (TID) | SUBCUTANEOUS | Status: DC

## 2014-05-04 MED ORDER — ALUM & MAG HYDROXIDE-SIMETH 200-200-20 MG/5ML PO SUSP
30.0000 mL | Freq: Four times a day (QID) | ORAL | Status: DC | PRN
Start: 2014-05-04 — End: 2014-05-17

## 2014-05-04 MED ORDER — INSULIN ASPART 100 UNIT/ML ~~LOC~~ SOLN
4.0000 [IU] | Freq: Three times a day (TID) | SUBCUTANEOUS | Status: DC
  Administered 2014-05-05 – 2014-05-17 (×34): 4 [IU] via SUBCUTANEOUS

## 2014-05-04 MED ORDER — HEPARIN SODIUM (PORCINE) 5000 UNIT/ML IJ SOLN
5000.0000 [IU] | Freq: Three times a day (TID) | INTRAMUSCULAR | Status: DC
  Administered 2014-05-04 – 2014-05-17 (×38): 5000 [IU] via SUBCUTANEOUS
  Filled 2014-05-04 (×43): qty 1

## 2014-05-04 MED ORDER — SENNOSIDES-DOCUSATE SODIUM 8.6-50 MG PO TABS: 1.0000 | ORAL_TABLET | Freq: Every evening | ORAL | Status: DC | PRN

## 2014-05-04 MED ORDER — HEPARIN SODIUM (PORCINE) 5000 UNIT/ML IJ SOLN: 5000.0000 [IU] | Freq: Three times a day (TID) | INTRAMUSCULAR | Status: DC

## 2014-05-04 MED ORDER — POTASSIUM CHLORIDE CRYS ER 20 MEQ PO TBCR
40.0000 meq | EXTENDED_RELEASE_TABLET | Freq: Once | ORAL | Status: AC
  Administered 2014-05-04: 40 meq via ORAL
  Filled 2014-05-04: qty 2

## 2014-05-04 MED ORDER — ONDANSETRON HCL 4 MG PO TABS: 4.0000 mg | ORAL_TABLET | Freq: Four times a day (QID) | ORAL | Status: DC | PRN

## 2014-05-04 MED ORDER — INSULIN GLARGINE 100 UNIT/ML ~~LOC~~ SOLN
20.0000 [IU] | Freq: Every day | SUBCUTANEOUS | Status: DC
  Administered 2014-05-04 – 2014-05-17 (×14): 20 [IU] via SUBCUTANEOUS
  Filled 2014-05-04 (×14): qty 0.2

## 2014-05-04 MED ORDER — INSULIN ASPART 100 UNIT/ML ~~LOC~~ SOLN
0.0000 [IU] | Freq: Three times a day (TID) | SUBCUTANEOUS | Status: DC
  Administered 2014-05-05 (×2): 1 [IU] via SUBCUTANEOUS
  Administered 2014-05-06: 2 [IU] via SUBCUTANEOUS
  Administered 2014-05-07 – 2014-05-08 (×3): 1 [IU] via SUBCUTANEOUS
  Administered 2014-05-08: 2 [IU] via SUBCUTANEOUS
  Administered 2014-05-09: 1 [IU] via SUBCUTANEOUS
  Administered 2014-05-09: 4 [IU] via SUBCUTANEOUS
  Administered 2014-05-11: 2 [IU] via SUBCUTANEOUS
  Administered 2014-05-11 – 2014-05-14 (×6): 1 [IU] via SUBCUTANEOUS
  Administered 2014-05-15: 2 [IU] via SUBCUTANEOUS
  Administered 2014-05-15 – 2014-05-16 (×3): 1 [IU] via SUBCUTANEOUS
  Administered 2014-05-16 – 2014-05-17 (×2): 2 [IU] via SUBCUTANEOUS

## 2014-05-04 NOTE — Discharge Summary (Addendum)
Physician Discharge Summary  Adam Callahan MRN: [id number] DOB/AGE: 1961-02-22 53 y.o.  PCP: No PCP Per Patient   Admit date: 04/29/2014 Discharge date: 05/04/2014  Discharge Diagnoses:      CVA (cerebral infarction) Active Problems:   Diabetes mellitus type 2, uncontrolled   Dyslipidemia   Spastic hemiplegia affecting dominant side   Stroke  Follow up recommendations Follow up with PCP after being discharged from rehabilitation Continue dual antiplatelet therapy for 3 months, then either aspirin or Plavix alone Followup on hypercoagulable tests ordered by neurology     Medication List         aspirin 81 MG chewable tablet  Chew 1 tablet (81 mg total) by mouth daily.     atorvastatin 20 MG tablet  Commonly known as:  LIPITOR  Take 1 tablet (20 mg total) by mouth daily at 6 PM.     clopidogrel 75 MG tablet  Commonly known as:  PLAVIX  Take 1 tablet (75 mg total) by mouth daily with breakfast.              insulin aspart 100 UNIT/ML injection  Commonly known as:  novoLOG  Inject 4 Units into the skin 3 (three) times daily with meals.     insulin glargine 100 UNIT/ML injection  Commonly known as:  LANTUS  Inject 0.2 mLs (20 Units total) into the skin daily.     senna-docusate 8.6-50 MG per tablet  Commonly known as:  Senokot-S  Take 1 tablet by mouth at bedtime as needed for mild constipation.     traMADol 50 MG tablet  Commonly known as:  ULTRAM  Take 50 mg by mouth every 6 (six) hours as needed for moderate pain.        Discharge Condition: Stable Disposition: 01-Home or Self Care   Consults: None  Significant Diagnostic Studies: Ct Head Wo Contrast  04/27/2014   CLINICAL DATA:  Onset of blurry vision in the right high at 5 p.m. this same day. Initial encounter.  EXAM: CT HEAD WITHOUT CONTRAST  TECHNIQUE: Contiguous axial images were obtained from the base of the skull through the vertex without intravenous contrast.  COMPARISON:  Brain  MRI 06/07/2013 and head CT scan 06/06/2013.  FINDINGS: Remote left cerebellar, thalamus and PCA territory infarcts are again seen. No evidence of acute intracranial abnormality including hemorrhage, infarct, mass lesion, mass effect, midline shift or abnormal extra-axial fluid collection is identified. No hydrocephalus or pneumocephalus. The calvarium is intact. Imaged paranasal sinuses and mastoid air cells are clear.  IMPRESSION: No acute finding in patient with remote infarcts as above.   Electronically Signed   By: Adam Callahan M.D.   On: 04/27/2014 20:14   Ct Angio Neck W/cm &/or Wo/cm  05/02/2014   CLINICAL DATA:  Stroke.  EXAM: CT ANGIOGRAPHY NECK  TECHNIQUE: Multidetector CT imaging of the neck was performed using the standard protocol during bolus administration of intravenous contrast. Multiplanar CT image reconstructions and MIPs were obtained to evaluate the vascular anatomy. Carotid stenosis measurements (when applicable) are obtained utilizing NASCET criteria, using the distal internal carotid diameter as the denominator.  CONTRAST:  89m OMNIPAQUE IOHEXOL 350 MG/ML SOLN  COMPARISON:  MRI MRA of the head 04/30/2014  FINDINGS: Lung apices are clear.  No mass or adenopathy in the neck.  Two vessel aortic arch. Left carotid origin from the innominate artery, a normal variant. Proximal great vessels appear normal without significant atherosclerotic disease.  Right carotid: Right common carotid artery  widely patent. Noncalcified low density plaque is seen in the right carotid bulb not causing significant stenosis. Remainder of the internal carotid artery widely patent. Right external carotid widely patent.  Left carotid: Left common carotid artery widely patent with mild noncalcified plaque in the distal common carotid artery. Left carotid bifurcation widely patent. Left internal carotid artery widely patent without stenosis.  Vertebral arteries: Left vertebral artery is slightly larger than the  right. Both vertebral arteries are widely patent without stenosis or dissection. Both vertebral arteries contribute to the basilar.  Review of the MIP images confirms the above findings.  IMPRESSION: Mild atherosclerotic disease involving the right carotid bulb and distal left common carotid artery without significant carotid stenosis. Both vertebral arteries are patent to the basilar without stenosis.   Electronically Signed   By: Adam Callahan M.D.   On: 05/02/2014 16:19   Mr Adam Callahan Wo Contrast  04/30/2014   CLINICAL DATA:  53 year old male with acute blurred vision and right facial pain diagnosed with right PCA infarct yesterday by MRI. Initial encounter.  EXAM: MRA HEAD WITHOUT CONTRAST  TECHNIQUE: Angiographic images of the Circle of Willis were obtained using MRA technique without intravenous contrast.  COMPARISON:  Brain MRI 11/27/2013. Neck and intracranial MRA 06/07/2013.  FINDINGS: Improved antegrade flow in the distal left vertebral artery, which now appears dominant compared to that on the right. The distal right vertebral functionally terminates in PICA as before. Improved flow at the vertebrobasilar junction. Patent AICA origins. No basilar artery stenosis. SCA origins remain patent, the right now appears irregular.  The left PCA remains occluded. Fetal type right PCA origin AND duplicated right posterior communicating artery is re - identified (separate supply of the right superior and inferior PCA territories. Both right posterior communicating artery are patent. The superior right PCA division is patent. However, the inferior division branch (supplied by the right Pcomm which has a more cephalad coarse) now is occluded in the equivalent of the right P2 segment (arrow on series 204, image 19).  Antegrade flow in both ICA siphons which are stable and without stenosis. Ophthalmic and right posterior communicating artery origins remain normal. Patent carotid termini. MCA and ACA origins are  within normal limits. Anterior communicating artery with median artery of the corpus callosum, and visualized bilateral ACA branches are stable and within normal limits (degraded by motion today). Visualized bilateral MCA branches are stable, with chronic moderate irregularity and stenosis at the origin of the middle sylvian division as seen in 2014. No major MCA branch occlusion.  IMPRESSION: 1. Anatomic variation of the right PCA supply, with "duplicated" fetal PCAs such that the inferior and superior PCA divisions are supplied by separate right posterior communicating arteries. 2. The inferior PCA division is occluded in the "P2" equivalent segment. The superior PCA division remains normal. 3. Chronic occlusion of the left PCA. 4. Interval improved distal left vertebral artery, such that that vessel now appears dominant. 5. Stable anterior circulation with chronic moderate stenosis at the origin of a left MCA M2 branch.   Electronically Signed   By: Lars Pinks M.D.   On: 04/30/2014 15:02   Mr Brain Wo Contrast  04/29/2014   CLINICAL DATA:  53 year old male with acute blurred vision and right facial pain. Initial encounter. History of prior strokes, including left PICA infarct in 2014.  EXAM: MRI HEAD WITHOUT CONTRAST  TECHNIQUE: Multiplanar, multiecho pulse sequences of the brain and surrounding structures were obtained without intravenous contrast.  COMPARISON:  Brain MRI  06/07/2013.  FINDINGS: Interval expected evolution of the extensive and partially hemorrhagic left PCA infarct, which included involvement of the left thalamus. Wallerian degeneration. Ex vacuo changes to the left occipital and temporal horns. Chronic inferior left cerebellar infarct.  Major intracranial vascular flow voids are stable.  Multifocal restricted diffusion in the right PCA territory, including the occipital pole, central occipital white matter, as well as the posterior and mesial right temporal lobe. The right hippocampus is  spared. Very hippocampal gyrus is significantly affected. No evidence of associated hemorrhage at this time. Early T2 and FLAIR hyperintensity in the affected areas.  Susceptibility artifact suspected on diffusion weighted imaging in the left PCA territory. No deep gray matter, brainstem, or cerebellar restricted diffusion. No associated mass effect. Elsewhere scattered cerebral white matter T2 and FLAIR hyperintensity is stable to mildly progressed. No ventriculomegaly or midline shift. Negative pituitary, cervicomedullary junction, and upper cervical spine.  Visible internal auditory structures appear normal. Rightward gaze deviation. Visualized orbit soft tissues are within normal limits. Stable paranasal sinuses and mastoids. Stable scalp soft tissues. Normal bone marrow signal.  There is a C4 level disc herniation resulting in degenerative spinal stenosis which appears chronic.  IMPRESSION: 1. Acute right PCA territory infarct, patchy involvement most pronounced in the right para hippocampal gyrus. Mild edema. No associated hemorrhage or mass effect. 2. Extensive chronic left PCA infarct with encephalomalacia and Wallerian degeneration. Chronic left cerebellar infarct. 3. 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   Electronically Signed   By: Lars Pinks M.D.   On: 04/29/2014 21:44    TEE IMPRESSION:  1. No LAA thrombus 2. Small bidirectional PFO. 3. LVEF 50-55%     Microbiology: No results found for this or any previous visit (from the past 240 hour(s)).   Labs: Results for orders placed during the hospital encounter of 04/29/14 (from the past 48 hour(s))  GLUCOSE, CAPILLARY     Status: Abnormal   Collection Time    05/02/14 12:05 PM      Result Value Ref Range   Glucose-Capillary 214 (*) 70 - 99 mg/dL   Comment 1 Documented in Chart     Comment 2 Notify RN    GLUCOSE, CAPILLARY     Status: Abnormal   Collection Time    05/02/14  5:00 PM       Result Value Ref Range   Glucose-Capillary 161 (*) 70 - 99 mg/dL   Comment 1 Documented in Chart     Comment 2 Notify RN    GLUCOSE, CAPILLARY     Status: Abnormal   Collection Time    05/02/14  9:25 PM      Result Value Ref Range   Glucose-Capillary 125 (*) 70 - 99 mg/dL   Comment 1 Notify RN    ANA     Status: None   Collection Time    05/03/14  4:16 AM      Result Value Ref Range   ANA NEGATIVE  NEGATIVE   Comment: Performed at [place]     Status: None   Collection Time    05/03/14  4:16 AM      Result Value Ref Range   C3 Complement 126  90 - 180 mg/dL   Comment: Performed at [place]     Status: None   Collection Time    05/03/14  4:16 AM      Result Value Ref Range  Complement C4, Body Fluid 25  10 - 40 mg/dL   Comment: Performed at [facility] (ROUTINE TESTING)     Status: None   Collection Time    05/03/14  4:16 AM      Result Value Ref Range   HIV 1&2 Ab, 4th Generation NONREACTIVE  NONREACTIVE   Comment: (NOTE)     A NONREACTIVE HIV Ag/Ab result does not exclude HIV infection since     the time frame for seroconversion is variable. If acute HIV infection     is suspected, a HIV-1 RNA Qualitative TMA test is recommended.     HIV-1/2 Antibody Diff         Not indicated.     HIV-1 RNA, Qual TMA           Not indicated.     PLEASE NOTE: This information has been disclosed to you from records     whose confidentiality may be protected by state law. If your state     requires such protection, then the state law prohibits you from making     any further disclosure of the information without the specific written     consent of the person to whom it pertains, or as otherwise permitted     by law. A general authorization for the release of medical or other     information is NOT sufficient for this purpose.     The performance of this assay has not been clinically validated in     patients  less than 65 years old.     Performed at [facility]     Status: None   Collection Time    05/03/14  4:16 AM      Result Value Ref Range   Homocysteine 8.4  4.0 - 15.4 umol/L   Comment: Performed at [place], IGG, IGM, IGA     Status: Abnormal   Collection Time    05/03/14  4:16 AM      Result Value Ref Range   Anticardiolipin IgG 7 (*) <23 GPL U/mL   Anticardiolipin IgM 1 (*) <11 MPL U/mL   Anticardiolipin IgA 10 (*) <22 APL U/mL   Comment: (NOTE)     Reference Range:  Cardiolipin IgG       Normal                  <23       Low Positive (+)        23-35       Moderate Positive (+)   36-50       High Positive (+)       >50     Reference Range:  Cardiolipin IgM       Normal                  <11       Low Positive (+)        11-20       Moderate Positive (+)   21-30       High Positive (+)       >30     Reference Range:  Cardiolipin IgA       Normal                  <22       Low Positive (+)        22-35  Moderate Positive (+)   36-45       High Positive (+)       >45     Performed at [facility]  RPR     Status: None   Collection Time    05/03/14  4:16 AM      Result Value Ref Range   RPR NON REAC  NON REAC   Comment: Performed at [place]     Status: None   Collection Time    05/03/14  4:16 AM      Result Value Ref Range   Sed Rate 10  0 - 16 mm/hr  BETA-2-GLYCOPROTEIN I ABS, IGG/M/A     Status: None   Collection Time    05/03/14  4:16 AM      Result Value Ref Range   Beta-2 Glyco I IgG 0  <20 G Units   Beta-2-Glycoprotein I IgM 2  <20 M Units   Beta-2-Glycoprotein I IgA 4  <20 A Units   Comment: Performed at [place]     Status: None   Collection Time    05/03/14  4:16 AM      Result Value Ref Range   c-ANCA Screen NEGATIVE  NEGATIVE   p-ANCA Screen NEGATIVE  NEGATIVE   Atypical p-ANCA Screen NEGATIVE  NEGATIVE    Comment: (NOTE)     ANCA Screen includes evaluation for p-ANCA, c-ANCA and Atypical     p-ANCA.     Performed at [facility] ANTIBODY     Status: None   Collection Time    05/03/14  4:16 AM      Result Value Ref Range   SSA (Ro) (ENA) Antibody, IgG <1.0 NEG  <1.0 NEG AI   Comment: Performed at [place]     Status: None   Collection Time    05/03/14  4:16 AM      Result Value Ref Range   SSB (La) (ENA) Antibody, IgG <1.0 NEG  <1.0 NEG AI   Comment: Performed at [place], CAPILLARY     Status: Abnormal   Collection Time    05/03/14  7:46 AM      Result Value Ref Range   Glucose-Capillary 149 (*) 70 - 99 mg/dL  GLUCOSE, CAPILLARY     Status: Abnormal   Collection Time    05/03/14 11:51 AM      Result Value Ref Range   Glucose-Capillary 146 (*) 70 - 99 mg/dL  COMPREHENSIVE METABOLIC PANEL     Status: Abnormal   Collection Time    05/03/14  1:13 PM      Result Value Ref Range   Sodium 138  137 - 147 mEq/L   Potassium 4.0  3.7 - 5.3 mEq/L   Comment: DELTA CHECK NOTED   Chloride 99  96 - 112 mEq/L   CO2 26  19 - 32 mEq/L   Glucose, Bld 191 (*) 70 - 99 mg/dL   BUN 8  6 - 23 mg/dL   Creatinine, Ser 0.69  0.50 - 1.35 mg/dL   Calcium 9.3  8.4 - 10.5 mg/dL   Total Protein 8.0  6.0 - 8.3 g/dL   Albumin 3.9  3.5 - 5.2 g/dL   AST 19  0 - 37 U/L   ALT 16  0 - 53 U/L  Alkaline Phosphatase 82  39 - 117 U/L   Total Bilirubin 0.4  0.3 - 1.2 mg/dL   GFR calc non Af Amer >90  >90 mL/min   GFR calc Af Amer >90  >90 mL/min   Comment: (NOTE)     The eGFR has been calculated using the CKD EPI equation.     This calculation has not been validated in all clinical situations.     eGFR's persistently <90 mL/min signify possible Chronic Kidney     Disease.   Anion gap 13  5 - 15  GLUCOSE, CAPILLARY     Status: Abnormal   Collection Time    05/03/14  5:45 PM       Result Value Ref Range   Glucose-Capillary 148 (*) 70 - 99 mg/dL  GLUCOSE, CAPILLARY     Status: Abnormal   Collection Time    05/03/14  9:00 PM      Result Value Ref Range   Glucose-Capillary 147 (*) 70 - 99 mg/dL  COMPREHENSIVE METABOLIC PANEL     Status: Abnormal   Collection Time    05/04/14  4:00 AM      Result Value Ref Range   Sodium 140  137 - 147 mEq/L   Potassium 3.5 (*) 3.7 - 5.3 mEq/L   Chloride 101  96 - 112 mEq/L   CO2 27  19 - 32 mEq/L   Glucose, Bld 174 (*) 70 - 99 mg/dL   BUN 7  6 - 23 mg/dL   Creatinine, Ser 0.77  0.50 - 1.35 mg/dL   Calcium 9.0  8.4 - 10.5 mg/dL   Total Protein 6.9  6.0 - 8.3 g/dL   Albumin 3.3 (*) 3.5 - 5.2 g/dL   AST 16  0 - 37 U/L   ALT 15  0 - 53 U/L   Alkaline Phosphatase 73  39 - 117 U/L   Total Bilirubin 0.4  0.3 - 1.2 mg/dL   GFR calc non Af Amer >90  >90 mL/min   GFR calc Af Amer >90  >90 mL/min   Comment: (NOTE)     The eGFR has been calculated using the CKD EPI equation.     This calculation has not been validated in all clinical situations.     eGFR's persistently <90 mL/min signify possible Chronic Kidney     Disease.   Anion gap 12  5 - 15  CBC     Status: Abnormal   Collection Time    05/04/14  4:00 AM      Result Value Ref Range   WBC 6.1  4.0 - 10.5 K/uL   RBC 4.54  4.22 - 5.81 MIL/uL   Hemoglobin 12.9 (*) 13.0 - 17.0 g/dL   HCT 37.9 (*) 39.0 - 52.0 %   MCV 83.5  78.0 - 100.0 fL   MCH 28.4  26.0 - 34.0 pg   MCHC 34.0  30.0 - 36.0 g/dL   RDW 12.6  11.5 - 15.5 %   Platelets 297  150 - 400 K/uL  GLUCOSE, CAPILLARY     Status: Abnormal   Collection Time    05/04/14  8:12 AM      Result Value Ref Range   Glucose-Capillary 154 (*) 70 - 99 mg/dL   Comment 1 Documented in Chart     Comment 2 Notify RN       HPI : Adam Callahan is a 53 y.o. right-handed male with history of diabetes mellitus peripheral neuropathy  and left PCA infarct in the past with residual right-sided weakness and dysarthria and received  inpatient rehabilitation services December of 2014. Patient noncompliant with medications at home due to financial issues. Patient lives alone and used walker prior to admission. Admitted 04/30/2014 with blurred vision and headache. 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 MRA of the brain with chronic occlusion of left PCA. Patient did not receive TPA. Echocardiogram with ejection fraction 11% grade 1 diastolic dysfunction. Carotid Dopplers with no ICA stenosis.Lower Extremity Dopplers negative DVT. TEE completed showing no LAA thrombus and small incidental findings of small PFO. No plan for loop recorder. Neurology consulted maintained on aspirin and Plavix for CVA prophylaxis. Subcutaneous heparin for DVT prophylaxis. Hemoglobin A1c 11.3 with insulin therapy as directed. Physical and occupational therapy evaluations completed 05/01/2014 with recommendations for physical medicine rehabilitation consult. Patient was admitted for comprehensive rehabilitation program   HOSPITAL COURSE:  Stroke  - acute R PCA infarct in the setting of R VA stenosis, fetal R PCA and medical non-compliance. Likely large vessel atherosclerosis given uncontrolled risk factor, medication non-compliance and multiple vessel stenosis on images.  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  - chronic L cerebellar infarct  Ischemic Hypercoagulable & Vasculitic Workup  Normal - RPR, ESR, HIV  Negative C3, C4, CH50, ANA, homocysteine, , ANCA screen, SSA & SSB  Pending complement total, lupus anticoagulant, prothrombin gene mutation  Mr Brain Wo Contrast  04/29/2014 1. Acute right PCA territory infarct, patchy involvement most pronounced in the right para hippocampal gyrus. Mild edema. No associated hemorrhage or mass effect. 2. Extensive chronic left PCA infarct with encephalomalacia and Wallerian degeneration.  Chronic left cerebellar infarct. 3. 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  [name] Head Wo Contrast  04/30/2014 1. Anatomic variation of the right PCA supply, with "duplicated" fetal PCAs such that the inferior and superior PCA divisions are supplied by separate right posterior communicating arteries. 2. The inferior PCA division is occluded in the "P2" equivalent segment. The superior PCA division remains normal. 3. Chronic occlusion of the left PCA. 4. Interval improved distal left vertebral artery, such that that vessel now appears dominant. 5. Stable anterior circulation with chronic moderate stenosis at the origin of a left MCA M2 branch.  Carotid Doppler Findings suggest 1-39% internal carotid artery stenosis bilaterally. The left vertebral artery is patent with antegrade flow, unable to visualize the right vertebral artery.  2D echo: Left ventricle: The cavity size was normal. There was mild concentric hypertrophy. Systolic function was normal. The estimated ejection fraction was in the range of 55% to 60%. Although no diagnostic regional wall motion abnormality was identified, this possibility cannot be completely excluded on the basis of this study. Doppler parameters are consistent with abnormal left ventricular relaxation (grade 1 diastolic dysfunction).  CT angio neck Mild atherosclerotic disease involving the right carotid bulb and distal left common carotid artery without significant carotid stenosis. Both vertebral arteries are patent to the basilar without stenosis.  TEE No LAA thrombus Small bidirectional PFO. LVEF 50-55%  Venous doppler - no evidence of DVT.  LDL 136 and A1C 11.3  Suppose to be on ASA and Plavix prior to admission, but he is non-compliance due to financial difficulty. dural antiplatelet continued in hospital. Would continue dual antiplatelets x 3 months then one alone.  He needs financial assistance to afford  his medications.   hypercoagulable tests as above No indication for loop given young age and lack of atrial fibrillation type symptoms. CIR transfer today  Diabetes, uncontrolled  - A1C 11.3  - on Lantus 20 units, NovoLog 4 units before each meal May need up titration - need aggressive modificatoin to achieve goal of A1C < 6.5   Hyperlipidemia  - LDL 136  - on lipitor  - continue on discharge.   Other pertinent problem  - pt non compliant with medication at home due to financial issue. Social worker consult requested for financial support.      Discharge Exam: Blood pressure 139/83, pulse 91, temperature 98.5 F (36.9 C), temperature source Oral, resp. rate 18, height 5' 6"  (1.676 m), weight 76.839 kg (169 lb 6.4 oz), SpO2 99.00%.  General: alert & oriented x 3 In NAD  Cardiovascular: RRR, nl S1 s2  Respiratory: Decreased breath sounds at the bases, scattered rhonchi, no crackles  Abdomen: soft +BS NT/ND, no masses palpable  Extremities: No cyanosis and no edema  Neurologic nonfocal          Signed: Lainie Daubert 05/04/2014, 11:40 AM

## 2014-05-04 NOTE — Discharge Instructions (Addendum)
STROKE/TIA DISCHARGE INSTRUCTIONS SMOKING Cigarette smoking nearly doubles your risk of having a stroke & is the single most alterable risk factor  If you smoke or have smoked in the last 12 months, you are advised to quit smoking for your health.  Most of the excess cardiovascular risk related to smoking disappears within a year of stopping.  Ask you doctor about anti-smoking medications  [facility] Quit Line: 1-800-QUIT NOW  Free Smoking Cessation Classes [phone]  CHOLESTEROL Know your levels; limit fat & cholesterol in your diet  Lipid Panel     Component Value Date/Time   CHOL 195 04/30/2014 0015   TRIG 88 04/30/2014 0015   HDL 41 04/30/2014 0015   CHOLHDL 4.8 04/30/2014 0015   VLDL 18 04/30/2014 0015   LDLCALC 136* 04/30/2014 0015      Many patients benefit from treatment even if their cholesterol is at goal.  Goal: Total Cholesterol (CHOL) less than 160  Goal:  Triglycerides (TRIG) less than 150  Goal:  HDL greater than 40  Goal:  LDL (LDLCALC) less than 100   BLOOD PRESSURE American Stroke Association blood pressure target is less that 120/80 mm/Hg  Your discharge blood pressure is:  BP: 121/69 mmHg  Monitor your blood pressure  Limit your salt and alcohol intake  Many individuals will require more than one medication for high blood pressure  DIABETES (A1c is a blood sugar average for last 3 months) Goal HGBA1c is under 7% (HBGA1c is blood sugar average for last 3 months)  Diabetes: Diagnosis of diabetes:  Your A1c:11.1 %    Lab Results  Component Value Date   HGBA1C 11.1* 04/30/2014     Your HGBA1c can be lowered with medications, healthy diet, and exercise.  Check your blood sugar as directed by your physician  Call your physician if you experience unexplained or low blood sugars.  PHYSICAL ACTIVITY/REHABILITATION Goal is 30 minutes at least 4 days per week  Activity: Increase activity slowly, Therapies: Inpatient rehab Return to work:   Activity  decreases your risk of heart attack and stroke and makes your heart stronger.  It helps control your weight and blood pressure; helps you relax and can improve your mood.  Participate in a regular exercise program.  Talk with your doctor about the best form of exercise for you (dancing, walking, swimming, cycling).  DIET/WEIGHT Goal is to maintain a healthy weight  Your discharge diet is:   Carb mordified/Heart healthy with regular liquids Your height is:  Height: 5\' 6"  (167.6 cm) Your current weight is: Weight: 76.839 kg (169 lb 6.4 oz) Your Body Mass Index (BMI) is:  BMI (Calculated): 27.3  Following the type of diet specifically designed for you will help prevent another stroke.  Your goal weight range is:  115 to 154 lbs.  Your goal Body Mass Index (BMI) is 19-24.  Healthy food habits can help reduce 3 risk factors for stroke:  High cholesterol, hypertension, and excess weight.  RESOURCES Stroke/Support Group:  Call [phone]   STROKE EDUCATION PROVIDED/REVIEWED AND GIVEN TO PATIENT Stroke warning signs and symptoms How to activate emergency medical system (call 911). Medications prescribed at discharge. Need for follow-up after discharge. Personal risk factors for stroke. Pneumonia vaccine given: NO Flu vaccine given: 04/30/2014 My questions have been answered, the writing is legible, and I understand these instructions.  I will adhere to these goals & educational materials that have been provided to me after my discharge from the hospital.    Diabetes  [place] and Food It is important for you to manage your blood sugar (glucose) level. Your blood glucose level can be greatly affected by what you eat. Eating healthier foods in the appropriate amounts throughout the day at about the same time each day will help you control your blood glucose level. It can also help slow or prevent worsening of your diabetes mellitus. Healthy eating may even help you improve the level of your  blood pressure and reach or maintain a healthy weight.  HOW CAN FOOD AFFECT ME? Carbohydrates Carbohydrates affect your blood glucose level more than any other type of food. Your dietitian will help you determine how many carbohydrates to eat at each meal and teach you how to count carbohydrates. Counting carbohydrates is important to keep your blood glucose at a healthy level, especially if you are using insulin or taking certain medicines for diabetes mellitus. Alcohol Alcohol can cause sudden decreases in blood glucose (hypoglycemia), especially if you use insulin or take certain medicines for diabetes mellitus. Hypoglycemia can be a life-threatening condition. Symptoms of hypoglycemia (sleepiness, dizziness, and disorientation) are similar to symptoms of having too much alcohol.  If your health care provider has given you approval to drink alcohol, do so in moderation and use the following guidelines:  Women should not have more than one drink per day, and men should not have more than two drinks per day. One drink is equal to:  12 oz of beer.  5 oz of wine.  1 oz of hard liquor.  Do not drink on an empty stomach.  Keep yourself hydrated. Have water, diet soda, or unsweetened iced tea.  Regular soda, juice, and other mixers might contain a lot of carbohydrates and should be counted. WHAT FOODS ARE NOT RECOMMENDED? As you make food choices, it is important to remember that all foods are not the same. Some foods have fewer nutrients per serving than other foods, even though they might have the same number of calories or carbohydrates. It is difficult to get your body what it needs when you eat foods with fewer nutrients. Examples of foods that you should avoid that are high in calories and carbohydrates but low in nutrients include:  Trans fats (most processed foods list trans fats on the Nutrition Facts label).  Regular soda.  Juice.  Candy.  Sweets, such as cake, pie, doughnuts,  and cookies.  Fried foods. WHAT FOODS CAN I EAT? Have nutrient-rich foods, which will nourish your body and keep you healthy. The food you should eat also will depend on several factors, including:  The calories you need.  The medicines you take.  Your weight.  Your blood glucose level.  Your blood pressure level.  Your cholesterol level. You also should eat a variety of foods, including:  Protein, such as meat, poultry, fish, tofu, nuts, and seeds (lean animal proteins are best).  Fruits.  Vegetables.  Dairy products, such as milk, cheese, and yogurt (low fat is best).  Breads, grains, pasta, cereal, rice, and beans.  Fats such as olive oil, trans fat-free margarine, canola oil, avocado, and olives. DOES EVERYONE WITH DIABETES MELLITUS HAVE THE SAME MEAL PLAN? Because every person with diabetes mellitus is different, there is not one meal plan that works for everyone. It is very important that you meet with a dietitian who will help you create a meal plan that is just right for you. Document Released: 04/04/2005 Document Revised: 07/13/2013 Document Reviewed: 06/04/2013 [facility] Patient Information 2015 [place], [place]. This  information is not intended to replace advice given to you by your health care provider. Make sure you discuss any questions you have with your health care provider.  Stroke Prevention Some medical conditions and behaviors are associated with an increased chance of having a stroke. You may prevent a stroke by making healthy choices and managing medical conditions. HOW CAN I REDUCE MY RISK OF HAVING A STROKE?  Stay physically active. Get at least 30 minutes of activity on most or all days. Do not smoke. It may also be helpful to avoid exposure to secondhand smoke. Limit alcohol use. Moderate alcohol use is considered to be: No more than 2 drinks per day for men. No more than 1 drink per day for nonpregnant women. Eat healthy foods. This  involves: Eating 5 or more servings of fruits and vegetables a day. Making dietary changes that address high blood pressure (hypertension), high cholesterol, diabetes, or obesity. Manage your cholesterol levels. Making food choices that are high in fiber and low in saturated fat, trans fat, and cholesterol may control cholesterol levels. Take any prescribed medicines to control cholesterol as directed by your health care provider. Manage your diabetes. Controlling your carbohydrate and sugar intake is recommended to manage diabetes. Take any prescribed medicines to control diabetes as directed by your health care provider. Control your hypertension. Making food choices that are low in salt (sodium), saturated fat, trans fat, and cholesterol is recommended to manage hypertension. Take any prescribed medicines to control hypertension as directed by your health care provider. Maintain a healthy weight. Reducing calorie intake and making food choices that are low in sodium, saturated fat, trans fat, and cholesterol are recommended to manage weight. Stop drug abuse. Avoid taking birth control pills. Talk to your health care provider about the risks of taking birth control pills if you are over 45 years old, smoke, get migraines, or have ever had a blood clot. Get evaluated for sleep disorders (sleep apnea). Talk to your health care provider about getting a sleep evaluation if you snore a lot or have excessive sleepiness. Take medicines only as directed by your health care provider. For some people, aspirin or blood thinners (anticoagulants) are helpful in reducing the risk of forming abnormal blood clots that can lead to stroke. If you have the irregular heart rhythm of atrial fibrillation, you should be on a blood thinner unless there is a good reason you cannot take them. Understand all your medicine instructions. Make sure that other conditions (such as anemia or atherosclerosis) are  addressed. SEEK IMMEDIATE MEDICAL CARE IF:  You have sudden weakness or numbness of the face, arm, or leg, especially on one side of the body. Your face or eyelid droops to one side. You have sudden confusion. You have trouble speaking (aphasia) or understanding. You have sudden trouble seeing in one or both eyes. You have sudden trouble walking. You have dizziness. You have a loss of balance or coordination. You have a sudden, severe headache with no known cause. You have new chest pain or an irregular heartbeat. Any of these symptoms may represent a serious problem that is an emergency. Do not wait to see if the symptoms will go away. Get medical help at once. Call your local emergency services (911 in [place] Do not drive yourself to the hospital. Document Released: 08/15/2004 Document Revised: 11/22/2013 Document Reviewed: 01/08/2013 [facility] Patient Information 2015 [place], [place]. This information is not intended to replace advice given to you by your health care provider. Make  sure you discuss any questions you have with your health care provider.

## 2014-05-04 NOTE — Plan of Care (Signed)
Problem: Discharge/Transitional Outcomes Goal: PCP appointment made and transportation plan in place Outcome: Not Applicable Date Met:  66/59/93 Pt going to inpatient rehab.

## 2014-05-04 NOTE — Progress Notes (Signed)
Rehab admissions - We did receive insurance approval from [facility] for inpatient rehab and I received medical clearance from Dr. Allyson Sabal. Bed is available and will admit pt to inpatient rehab today.  I updated pt who was very pleased with this news and was a bit labile at times. I completed admission paperwork with pt and then updated his dtr by phone. I also reviewed admission paperwork details by phone with her. She is in agreement with the plan for inpatient rehab as well.  I updated pt's RN, Hassan Rowan with case management and [place], Education officer, museum. Please call me with any questions. Thanks.  Nanetta Batty, PT Rehabilitation Admissions Coordinator [phone]

## 2014-05-04 NOTE — H&P (Signed)
Physical Medicine and Rehabilitation Admission H&P      Chief Complaint   Patient presents with   .  Blurred Vision    : HPI: Adam Callahan is a 53 y.o. 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 Patient noncompliant with medications at home due to financial issues. Patient lives alone and used walker prior to admission. Admitted 04/30/2014 with blurred vision and headache. 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 MRA of the brain with chronic occlusion of left PCA. Patient did not receive TPA. Echocardiogram with ejection fraction 09% grade 1 diastolic dysfunction. Carotid Dopplers with no ICA stenosis.Lower Extremity Dopplers negative DVT. TEE completed showing no LAA thrombus and small incidental findings of small PFO. No plan for loop recorder. Neurology consulted maintained on aspirin and Plavix for CVA prophylaxis. Subcutaneous heparin for DVT prophylaxis. Hemoglobin A1c 11.3 with insulin therapy as directed. Physical and occupational therapy evaluations completed 05/01/2014 with recommendations for physical medicine rehabilitation consult. Patient was admitted for comprehensive rehabilitation program  Patient is tearful and emotionally labile but this is baseline from the prior stroke.   ROS Review of Systems   Eyes: Positive for blurred vision.   Gastrointestinal: Positive for constipation.   Musculoskeletal: Positive for myalgias.   Neurological: Positive for speech change and weakness.   All other systems reviewed and are negative    Past Medical History   Diagnosis  Date   .  Diabetes mellitus     .  Hypertension      Past Surgical History   Procedure  Laterality  Date   .  Tee without cardioversion  N/A  05/03/2014        Procedure: TRANSESOPHAGEAL ECHOCARDIOGRAM (TEE);  Surgeon: Pixie Casino, MD;  Location: [facility] ENDOSCOPY;  Service: Cardiovascular;  Laterality: N/A;    Family History   Problem  Relation  Age of Onset   .  Cervical cancer  Mother     .  Hypertension  Sister     .  Diabetes Mellitus II  Maternal Uncle      Social History: reports that he has quit smoking. He does not have any smokeless tobacco history on file. He reports that he does not drink alcohol or use illicit drugs. Allergies: No Known Allergies Medications Prior to Admission   Medication  Sig  Dispense  Refill   .  aspirin 81 MG chewable tablet  Chew 1 tablet (81 mg total) by mouth daily.         Marland Kitchen  atorvastatin (LIPITOR) 20 MG tablet  Take 1 tablet (20 mg total) by mouth daily at 6 PM.   30 tablet   1   .  clopidogrel (PLAVIX) 75 MG tablet  Take 1 tablet (75 mg total) by mouth daily with breakfast.   30 tablet   1   .  insulin aspart (NOVOLOG) 100 UNIT/ML injection  Inject 6 Units into the skin 3 (three) times daily with meals.   1 vial   12   .  insulin glargine (LANTUS) 100 UNIT/ML injection  Inject 0.25 mLs (25 Units total) into the skin daily.   10 mL   12   .  traMADol (ULTRAM) 50 MG tablet  Take 50 mg by mouth every 6 (six) hours as needed for moderate pain.  Home: Home Living Family/patient expects to be discharged to:: Private residence Living Arrangements: Alone Available Help at Discharge: Family;Friend(s);Available 24 hours/day Type of Home: House Home Access: Other (comment) (threshold to porch and inside house) Home Layout: One level Home Equipment: Teaching laboratory technician - manual;Shower seat  Lives With: Alone    Functional History: Prior Function Level of Independence: Independent with assistive device(s)   Functional Status:   Mobility: Bed Mobility Overal bed mobility: Needs Assistance Bed Mobility: Supine to Sit Supine to sit: Min guard Sit to supine: Supervision General bed mobility  comments: up clumsily, but without assist Transfers Overall transfer level: Needs assistance Equipment used: 1 person hand held assist Transfers: Sit to/from Stand Sit to Stand: Min assist Stand pivot transfers: Min assist General transfer comment: assist to keep patient coming forward over BOS Ambulation/Gait Ambulation/Gait assistance: Min assist;+2 safety/equipment Ambulation Distance (Feet): 175 Feet Assistive device: 2 person hand held assist Gait Pattern/deviations: Step-through pattern;Decreased step length - left;Decreased stance time - right;Ataxic;Narrow base of support Gait velocity: decreased General Gait Details: short step length, midly ataxic on the R > L;L hip drop during L LE swing through difficult to control.  Needed w/shift assist.   ADL: ADL Overall ADL's : Needs assistance/impaired Eating/Feeding: Set up;Sitting Grooming: Oral care;Wash/dry face;Applying deodorant;Minimal assistance;Standing Upper Body Bathing: Standing;Moderate assistance Lower Body Bathing: Min guard;Sit to/from stand Upper Body Dressing : Sitting;Standing;Minimal assistance Lower Body Dressing: Sit to/from stand;Minimal assistance Toilet Transfer: +2 for physical assistance;Moderate assistance;Ambulation (bed/chair) Toileting- Clothing Manipulation and Hygiene: Sit to/from stand;Minimal assistance Functional mobility during ADLs: +2 for physical assistance;Moderate assistance (hand held assist) General ADL Comments: Pt performed some ADLs at sink. +2 assist needed for ambulation. Spoke about safety. Encouraged pt to try to use RUE to support items if able. Stood at sink with Min guard assist for balance. Discussed UB dressing technique.   Cognition: Cognition Overall Cognitive Status: Difficult to assess Orientation Level: Oriented to person;Oriented to place;Oriented to situation;Disoriented to time Attention: Selective Selective Attention: Impaired Selective Attention Impairment:  Verbal basic Memory: Impaired Memory Impairment: Prospective memory;Storage deficit Awareness: Impaired Awareness Impairment: Anticipatory impairment Problem Solving: Impaired Problem Solving Impairment: Verbal complex Behaviors: Lability Safety/Judgment: Impaired Cognition Arousal/Alertness: Awake/alert Behavior During Therapy: WFL for tasks assessed/performed Overall Cognitive Status: Difficult to assess   Physical Exam: Blood pressure 139/83, pulse 91, temperature 98.5 F (36.9 C), temperature source Oral, resp. rate 18, height 5' 6"  (1.676 m), weight 76.839 kg (169 lb 6.4 oz), SpO2 99.00%. Physical Exam Constitutional: He is oriented to person, place, and time. He appears well-developed.   HENT:  Right facial droop   Eyes: EOM are normal.   Neck: Normal range of motion. Neck supple. No thyromegaly present.   Cardiovascular: Normal rate and regular rhythm.   Respiratory: Effort normal and breath sounds normal. No respiratory distress.   GI: Soft. Bowel sounds are normal. He exhibits no distension.  Neurological: He is alert and oriented to person, place, and time.  Speech is dysarthric but intelligible. He does follow simple commands. Fair awareness of his deficits. RUE 2/5 prox to distal, inconsistent. LUE 4/5. LE's 3+ to 4-HF to 4/5 ankles. Has difficulty with attention and sequencing. Had no overt weakness or FMC issues in the left arm and leg. ?visual acuity inconsistent.  Skin: Skin is warm and dry.  Psychiatric: He has a normal mood and affect. His behavior is normal. Thought content normal    Results for orders placed during the hospital encounter of 04/29/14 (from the past  48 hour(s))   GLUCOSE, CAPILLARY     Status: Abnormal     Collection Time      05/02/14 12:05 PM       Result  Value  Ref Range     Glucose-Capillary  214 (*)  70 - 99 mg/dL     Comment 1  Documented in Chart        Comment 2  Notify RN      GLUCOSE, CAPILLARY     Status: Abnormal     Collection  Time      05/02/14  5:00 PM       Result  Value  Ref Range     Glucose-Capillary  161 (*)  70 - 99 mg/dL     Comment 1  Documented in Chart        Comment 2  Notify RN      GLUCOSE, CAPILLARY     Status: Abnormal     Collection Time      05/02/14  9:25 PM       Result  Value  Ref Range     Glucose-Capillary  125 (*)  70 - 99 mg/dL     Comment 1  Notify RN      C3 COMPLEMENT     Status: None     Collection Time      05/03/14  4:16 AM       Result  Value  Ref Range     C3 Complement  126   90 - 180 mg/dL     Comment:  Performed at [place]     Status: None     Collection Time      05/03/14  4:16 AM       Result  Value  Ref Range     Complement C4, Body Fluid  25   10 - 40 mg/dL     Comment:  Performed at [facility]   HIV ANTIBODY (ROUTINE TESTING)     Status: None     Collection Time      05/03/14  4:16 AM       Result  Value  Ref Range     HIV 1&2 Ab, 4th Generation  NONREACTIVE   NONREACTIVE     Comment:  (NOTE)        A NONREACTIVE HIV Ag/Ab result does not exclude HIV infection since        the time frame for seroconversion is variable. If acute HIV infection        is suspected, a HIV-1 RNA Qualitative TMA test is recommended.        HIV-1/2 Antibody Diff         Not indicated.        HIV-1 RNA, Qual TMA           Not indicated.        PLEASE NOTE: This information has been disclosed to you from records        whose confidentiality may be protected by state law. If your state        requires such protection, then the state law prohibits you from making        any further disclosure of the information without the specific written        consent of the person to whom it pertains, or as otherwise permitted        by law. A general authorization for the  release of medical or other        information is NOT sufficient for this purpose.        The performance of this assay has not been clinically validated in        patients less than 62  years old.        Performed at [place]     Status: None     Collection Time      05/03/14  4:16 AM       Result  Value  Ref Range     Homocysteine  8.4   4.0 - 15.4 umol/L     Comment:  Performed at [place], IGG, IGM, IGA     Status: Abnormal     Collection Time      05/03/14  4:16 AM       Result  Value  Ref Range     Anticardiolipin IgG  7 (*)  <23 GPL U/mL     Anticardiolipin IgM  1 (*)  <11 MPL U/mL     Anticardiolipin IgA  10 (*)  <22 APL U/mL     Comment:  (NOTE)        Reference Range:  Cardiolipin IgG          Normal                  <23          Low Positive (+)        23-35          Moderate Positive (+)   36-50          High Positive (+)       >50        Reference Range:  Cardiolipin IgM          Normal                  <11          Low Positive (+)        11-20          Moderate Positive (+)   21-30          High Positive (+)       >30        Reference Range:  Cardiolipin IgA          Normal                  <22          Low Positive (+)        22-35          Moderate Positive (+)   36-45          High Positive (+)       >45        Performed at [facility]   RPR     Status: None     Collection Time      05/03/14  4:16 AM       Result  Value  Ref Range     RPR  NON REAC   NON REAC     Comment:  Performed at [place]     Status: None     Collection Time      05/03/14  4:16 AM       Result  Value  Ref Range  Sed Rate  10   0 - 16 mm/hr   BETA-2-GLYCOPROTEIN I ABS, IGG/M/A     Status: None     Collection Time      05/03/14  4:16 AM       Result  Value  Ref Range     Beta-2 Glyco I IgG  0   <20 G Units     Beta-2-Glycoprotein I IgM  2   <20 M Units     Beta-2-Glycoprotein I IgA  4   <20 A Units     Comment:  Performed at [place]     Status: None     Collection Time      05/03/14  4:16 AM       Result   Value  Ref Range     c-ANCA Screen  NEGATIVE   NEGATIVE     p-ANCA Screen  NEGATIVE   NEGATIVE     Atypical p-ANCA Screen  NEGATIVE   NEGATIVE     Comment:  (NOTE)        ANCA Screen includes evaluation for p-ANCA, c-ANCA and Atypical        p-ANCA.        Performed at [place] ANTIBODY     Status: None     Collection Time      05/03/14  4:16 AM       Result  Value  Ref Range     SSA (Ro) (ENA) Antibody, IgG  <1.0 NEG   <1.0 NEG AI     Comment:  Performed at [place] ANTIBODY     Status: None     Collection Time      05/03/14  4:16 AM       Result  Value  Ref Range     SSB (La) (ENA) Antibody, IgG  <1.0 NEG   <1.0 NEG AI     Comment:  Performed at [place], CAPILLARY     Status: Abnormal     Collection Time      05/03/14  7:46 AM       Result  Value  Ref Range     Glucose-Capillary  149 (*)  70 - 99 mg/dL   GLUCOSE, CAPILLARY     Status: Abnormal     Collection Time      05/03/14 11:51 AM       Result  Value  Ref Range     Glucose-Capillary  146 (*)  70 - 99 mg/dL   COMPREHENSIVE METABOLIC PANEL     Status: Abnormal     Collection Time      05/03/14  1:13 PM       Result  Value  Ref Range     Sodium  138   137 - 147 mEq/L     Potassium  4.0   3.7 - 5.3 mEq/L     Comment:  DELTA CHECK NOTED     Chloride  99   96 - 112 mEq/L     CO2  26   19 - 32 mEq/L     Glucose, Bld  191 (*)  70 - 99 mg/dL     BUN  8   6 - 23 mg/dL     Creatinine, Ser  0.69   0.50 - 1.35 mg/dL  Calcium  9.3   8.4 - 10.5 mg/dL     Total Protein  8.0   6.0 - 8.3 g/dL     Albumin  3.9   3.5 - 5.2 g/dL     AST  19   0 - 37 U/L     ALT  16   0 - 53 U/L     Alkaline Phosphatase  82   39 - 117 U/L     Total Bilirubin  0.4   0.3 - 1.2 mg/dL     GFR calc non Af Amer  >90   >90 mL/min     GFR calc Af Amer  >90   >90 mL/min     Comment:  (NOTE)        The eGFR has been  calculated using the CKD EPI equation.        This calculation has not been validated in all clinical situations.        eGFR's persistently <90 mL/min signify possible Chronic Kidney        Disease.     Anion gap  13   5 - 15   GLUCOSE, CAPILLARY     Status: Abnormal     Collection Time      05/03/14  5:45 PM       Result  Value  Ref Range     Glucose-Capillary  148 (*)  70 - 99 mg/dL   GLUCOSE, CAPILLARY     Status: Abnormal     Collection Time      05/03/14  9:00 PM       Result  Value  Ref Range     Glucose-Capillary  147 (*)  70 - 99 mg/dL   COMPREHENSIVE METABOLIC PANEL     Status: Abnormal     Collection Time      05/04/14  4:00 AM       Result  Value  Ref Range     Sodium  140   137 - 147 mEq/L     Potassium  3.5 (*)  3.7 - 5.3 mEq/L     Chloride  101   96 - 112 mEq/L     CO2  27   19 - 32 mEq/L     Glucose, Bld  174 (*)  70 - 99 mg/dL     BUN  7   6 - 23 mg/dL     Creatinine, Ser  0.77   0.50 - 1.35 mg/dL     Calcium  9.0   8.4 - 10.5 mg/dL     Total Protein  6.9   6.0 - 8.3 g/dL     Albumin  3.3 (*)  3.5 - 5.2 g/dL     AST  16   0 - 37 U/L     ALT  15   0 - 53 U/L     Alkaline Phosphatase  73   39 - 117 U/L     Total Bilirubin  0.4   0.3 - 1.2 mg/dL     GFR calc non Af Amer  >90   >90 mL/min     GFR calc Af Amer  >90   >90 mL/min     Comment:  (NOTE)        The eGFR has been calculated using the CKD EPI equation.        This calculation has not been validated in all clinical situations.        eGFR's  persistently <90 mL/min signify possible Chronic Kidney        Disease.     Anion gap  12   5 - 15   CBC     Status: Abnormal     Collection Time      05/04/14  4:00 AM       Result  Value  Ref Range     WBC  6.1   4.0 - 10.5 K/uL     RBC  4.54   4.22 - 5.81 MIL/uL     Hemoglobin  12.9 (*)  13.0 - 17.0 g/dL     HCT  37.9 (*)  39.0 - 52.0 %     MCV  83.5   78.0 - 100.0 fL     MCH  28.4   26.0 - 34.0 pg     MCHC  34.0   30.0 - 36.0 g/dL     RDW  12.6   11.5 -  15.5 %     Platelets  297   150 - 400 K/uL   GLUCOSE, CAPILLARY     Status: Abnormal     Collection Time      05/04/14  8:12 AM       Result  Value  Ref Range     Glucose-Capillary  154 (*)  70 - 99 mg/dL     Comment 1  Documented in Chart        Comment 2  Notify RN       Ct Angio Neck W/cm &/or Wo/cm   05/02/2014   CLINICAL DATA:  Stroke.  EXAM: CT ANGIOGRAPHY NECK  TECHNIQUE: Multidetector CT imaging of the neck was performed using the standard protocol during bolus administration of intravenous contrast. Multiplanar CT image reconstructions and MIPs were obtained to evaluate the vascular anatomy. Carotid stenosis measurements (when applicable) are obtained utilizing NASCET criteria, using the distal internal carotid diameter as the denominator.  CONTRAST:  43m OMNIPAQUE IOHEXOL 350 MG/ML SOLN  COMPARISON:  MRI MRA of the head 04/30/2014  FINDINGS: Lung apices are clear.  No mass or adenopathy in the neck.  Two vessel aortic arch. Left carotid origin from the innominate artery, a normal variant. Proximal great vessels appear normal without significant atherosclerotic disease.  Right carotid: Right common carotid artery widely patent. Noncalcified low density plaque is seen in the right carotid bulb not causing significant stenosis. Remainder of the internal carotid artery widely patent. Right external carotid widely patent.  Left carotid: Left common carotid artery widely patent with mild noncalcified plaque in the distal common carotid artery. Left carotid bifurcation widely patent. Left internal carotid artery widely patent without stenosis.  Vertebral arteries: Left vertebral artery is slightly larger than the right. Both vertebral arteries are widely patent without stenosis or dissection. Both vertebral arteries contribute to the basilar.  Review of the MIP images confirms the above findings.  IMPRESSION: Mild atherosclerotic disease involving the right carotid bulb and distal left common  carotid artery without significant carotid stenosis. Both vertebral arteries are patent to the basilar without stenosis.   Electronically Signed   By: CFranchot GalloM.D.   On: 05/02/2014 16:19           Medical Problem List and Plan: 1. Functional deficits secondary to new right PCA infarct felt to be secondary small vessel disease, prior left PCA infarct 2.  DVT Prophylaxis/Anticoagulation: Subcutaneous heparin. Monitor platelet counts any signs of bleeding 3. Pain Management: Ultram as needed. Monitor with increased  mobility 4. Diabetes mellitus with peripheral neuropathy. Hemoglobin A1c 11.3. NovoLog 4 units 3 times a day, Lantus insulin 20 units daily. Check CBGs a.c. and at bedtime. 5. Neuropsych: This patient is not capable of making decisions on his own behalf. 6. Skin/Wound Care: Routine skin checks 7. Fluids/Electrolytes/Nutrition: Strict I and O.'s. Followup chemistries. Provide nutritional supplements as needed 8. Hyperlipidemia. Lipitor 9. Poor medical compliance due to financial constraints. Follow financial counselor       Post Admission Physician Evaluation: Functional deficits secondary  to new right PCA infarct felt to be secondary small vessel disease, prior left PCA infarct . Patient is admitted to receive collaborative, interdisciplinary care between the physiatrist, rehab nursing staff, and therapy team. Patient's level of medical complexity and substantial therapy needs in context of that medical necessity cannot be provided at a lesser intensity of care such as a SNF. Patient has experienced substantial functional loss from his/her baseline which was documented above under the "Functional History" and "Functional Status" headings.  Judging by the patient's diagnosis, physical exam, and functional history, the patient has potential for functional progress which will result in measurable gains while on inpatient rehab.  These gains will be of substantial and  practical use upon discharge  in facilitating mobility and self-care at the household level. Physiatrist will provide 24 hour management of medical needs as well as oversight of the therapy plan/treatment and provide guidance as appropriate regarding the interaction of the two. 24 hour rehab nursing will assist with bladder management, bowel management, safety, skin/wound care, disease management, medication administration, pain management and patient education  and help integrate therapy concepts, techniques,education, etc. PT will assess and treat for/with: pre gait, gait training, endurance , safety, equipment, neuromuscular re education.   Goals are: Sup. OT will assess and treat for/with: ADLs, Cognitive perceptual skills, Neuromuscular re education, safety, endurance, equipment.   Goals are: Sup. Therapy may proceed with showering this patient. SLP will assess and treat for/with: aphasia, cognition, swallow.  Goals are: sup med management, express basic needs, safe po intake. Case Management and Social Worker will assess and treat for psychological issues and discharge planning. Team conference will be held weekly to assess progress toward goals and to determine barriers to discharge. Patient will receive at least 3 hours of therapy per day at least 5 days per week. ELOS: 7-10d        Prognosis:  good  Charlett Blake M.D. [facility] FAAPM&R (Sports Med, Neuromuscular Med) Diplomate Am Board of Electrodiagnostic Med  05/04/2014

## 2014-05-04 NOTE — Progress Notes (Signed)
CSW (Clinical Education officer, museum) received consult for SNF. However, CSW made aware by inpatient rehab admissions coordinator that they did receive insurance authorization and pt will be admitted to [facility] today is medically ready. Pt has no hospital social work needs at this time.   Adam Callahan, [place]

## 2014-05-04 NOTE — PMR Pre-admission (Signed)
PMR Admission Coordinator Pre-Admission Assessment  Patient: Adam Callahan is an 53 y.o., male MRN: [id number] DOB: Feb 12, 1961 Height: 5' 6"  (167.6 cm) Weight: 76.839 kg (169 lb 6.4 oz)              Insurance Information HMO:     PPO:      PCP:      IPA:      80/20:      OTHER:  PRIMARY: Blue Cross Blue Shield      Policy#: [id number]      Subscriber: self CM Name: Leeann Must      Phone#: [phone]     Fax#: [phone] Authorization for 2 weeks given on 05-04-14 through 05-18-14 with updates due on 05-18-14 or upon DC to [facility] at above fax Pre-Cert#: [id number]      Employer: [employer] Benefits:  Phone #: [phone]     Name: per Blue-E Eff. Date: 07-22-13     Deduct: $700 (met all)      Out of Pocket Max: $3910 (met $2306.90)      Life Max: none CIR: 80/20% and $233 copay per admit, pre-auth needed      SNF: 80/20%, visit limit 100 days, pre-auth needed Outpatient: 80%     Co-Pay: 20% and $52 copay, no visit limits Home Health: 80%      Co-Pay: 20%, no visit limits DME: 80%     Co-Pay: 20% Providers: in network  Emergency Contact Information Contact Information   Name Relation Home Work Mobile   [place] Daughter   [phone]     Current Medical History  Patient Admitting Diagnosis: New right PCA infarct, prior left PCA infarct  History of Present Illness: Adam Callahan is a 53 y.o. 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 Patient lives alone and used walker prior to admission. Admitted 04/30/2014 with blurred vision and headache. 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 MRA of the brain with chronic occlusion of left PCA. Patient did not receive TPA. Echocardiogram with ejection fraction 61% grade 1  diastolic dysfunction. Carotid Dopplers with no ICA stenosis. Neurology consulted maintained on aspirin and Plavix for CVA prophylaxis. Subcutaneous heparin for DVT prophylaxis. Hemoglobin A1c 11.3 with insulin therapy as directed. Physical therapy evaluation completed 05/01/2014 with recommendations for physical medicine rehabilitation consult.  NIH Total: 9  Past Medical History  Past Medical History  Diagnosis Date  . Diabetes mellitus   . Hypertension     Family History  family history includes Cervical cancer in his mother; Diabetes Mellitus II in his maternal uncle; Hypertension in his sister.  Prior Rehab/Hospitalizations: none   Current Medications  Current facility-administered medications:aspirin EC tablet 81 mg, 81 mg, Oral, Daily, Amie Portland, MD, 81 mg at 05/04/14 0951;  atorvastatin (LIPITOR) tablet 20 mg, 20 mg, Oral, q1800, Ivor Costa, MD, 20 mg at 05/03/14 1755;  clopidogrel (PLAVIX) tablet 75 mg, 75 mg, Oral, Q breakfast, Ivor Costa, MD, 75 mg at 05/04/14 9509;  heparin injection 5,000 Units, 5,000 Units, Subcutaneous, 3 times per day, Ivor Costa, MD, 5,000 Units at 05/03/14 2142 insulin aspart (novoLOG) injection 0-9 Units, 0-9 Units, Subcutaneous, TID WC, Reyne Dumas, MD, 1 Units at 05/04/14 1154;  insulin aspart (novoLOG) injection 4 Units, 4 Units, Subcutaneous, TID WC, Reyne Dumas, MD, 4 Units  at 05/04/14 1155;  insulin glargine (LANTUS) injection 20 Units, 20 Units, Subcutaneous, Daily, Reyne Dumas, MD, 20 Units at 05/04/14 0950;  senna-docusate (Senokot-S) tablet 1 tablet, 1 tablet, Oral, QHS PRN, Ivor Costa, MD traMADol Veatrice Bourbon) tablet 50 mg, 50 mg, Oral, Q6H PRN, Ivor Costa, MD  Patients Current Diet:  heart healthy/carb modified  Precautions / Restrictions Precautions Precautions: Fall Restrictions Weight Bearing Restrictions: No   Prior Activity Level Community (5-7x/wk): pt was currently working as a Sports coach for [employer], and was living  independently alone. Pt is very determined to get back to his own home and "keep my house" after he is recovered.   Home Assistive Devices / Equipment Home Assistive Devices/Equipment: None Home Equipment: Cane - quad;Wheelchair - manual;Shower seat  Prior Functional Level Prior Function Level of Independence: Independent with assistive device(s)  Current Functional Level Cognition  Overall Cognitive Status: Difficult to assess Orientation Level: Oriented to person;Oriented to place;Oriented to time;Disoriented to situation Attention: Selective Selective Attention: Impaired Selective Attention Impairment: Verbal basic Memory: Impaired Memory Impairment: Prospective memory;Storage deficit Awareness: Impaired Awareness Impairment: Anticipatory impairment Problem Solving: Impaired Problem Solving Impairment: Verbal complex Behaviors: Lability Safety/Judgment: Impaired    Extremity Assessment (includes Sensation/Coordination)          ADLs  Overall ADL's : Needs assistance/impaired Eating/Feeding: Set up;Sitting Grooming: Oral care;Wash/dry face;Applying deodorant;Minimal assistance;Standing Upper Body Bathing: Standing;Moderate assistance Lower Body Bathing: Min guard;Sit to/from stand Upper Body Dressing : Sitting;Standing;Minimal assistance Lower Body Dressing: Sit to/from stand;Minimal assistance Toilet Transfer: +2 for physical assistance;Moderate assistance;Ambulation (bed/chair) Toileting- Clothing Manipulation and Hygiene: Sit to/from stand;Minimal assistance Functional mobility during ADLs: +2 for physical assistance;Moderate assistance (hand held assist) General ADL Comments: Pt performed some ADLs at sink. +2 assist needed for ambulation. Spoke about safety. Encouraged pt to try to use RUE to support items if able. Stood at sink with Min guard assist for balance. Discussed UB dressing technique.    Mobility  Overal bed mobility: Needs Assistance Bed Mobility:  Supine to Sit Supine to sit: Min guard Sit to supine: Supervision General bed mobility comments: up clumsily, but without assist    Transfers  Overall transfer level: Needs assistance Equipment used: 1 person hand held assist Transfers: Sit to/from Stand Sit to Stand: Min assist Stand pivot transfers: Min assist General transfer comment: assist to keep patient coming forward over BOS    Ambulation / Gait / Stairs / Wheelchair Mobility  Ambulation/Gait Ambulation/Gait assistance: Min assist;+2 safety/equipment Ambulation Distance (Feet): 175 Feet Assistive device: 2 person hand held assist Gait Pattern/deviations: Step-through pattern;Decreased step length - left;Decreased stance time - right;Ataxic;Narrow base of support Gait velocity: decreased General Gait Details: short step length, midly ataxic on the R > L;L hip drop during L LE swing through difficult to control.  Needed w/shift assist.    Posture / Balance Dynamic Sitting Balance Sitting balance - Comments: Worked on w/shift and sitting balance/stability with reaching and leg Kick activities    Special needs/care consideration BiPAP/CPAP no CPM no  Continuous Drip IV no  Dialysis no         Life Vest no  Oxygen  Special Bed no  Trach Size no  Wound Vac (area) no        Skin - no issues                               Bowel mgmt: - no reported BM documented since admit.  Pt thinks his las BM was over the weekend PTA.  Bladder mgmt: using urinal currently Diabetic mgmt - managed at home with insulin  Note: pt did become labile at times during my interactions with him.   Previous Home Environment Living Arrangements: Alone  Lives With: Alone Available Help at Discharge: Family;Friend(s);Available 24 hours/day Type of Home: House Home Layout: One level Home Access: Other (comment) (threshold to porch and inside house) Bathroom Shower/Tub: Tub/shower unit;Curtain Bathroom Toilet: Standard Bathroom Accessibility:  Yes How Accessible: Accessible via wheelchair;Accessible via walker Home Care Services: Yes  Discharge Living Setting Plans for Discharge Living Setting: House;Other (Comment) (Plan is to go to dtr's house in [place], [place]) Type of Home at Discharge: House Discharge Home Layout: One level Discharge Home Access: Level entry Does the patient have any problems obtaining your medications?: No  Social/Family/Support Systems Patient Roles: Other (Comment) (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 Anticipated Caregiver's Contact Information: see above Ability/Limitations of Caregiver: dtr works but ex-wife can provide care during the day. Pt has two ex-wives. Pt's first ex-wife is Clance Baquero (mother of CeJie) and Enid Derry is willing to help provide assistance during the day for pt while dtr is at work. Pt was hesitant at first about this plan but was willing. "I love my dtr and I need the help. I haven't talked to my ex-wife in a long time."  Caregiver Availability: Other (Comment) (24-7 between dtr and ex-wife) Discharge Plan Discussed with Primary Caregiver:  (Discussed by phone with dtr on 10-13 and 10-4. ) Is Caregiver In Agreement with Plan?: Yes Does Caregiver/Family have Issues with Lodging/Transportation while Pt is in Rehab?: No  Note: pt's ex-wife Rita Prom does not have a phone and can be reached through dtr. CeJie's contact information. Enid Derry also lives in [place].  Dtr shared that she is having some car trouble and has not been able to visit her dad yet. She hopes to have the car issues taken care of soon.  Goals/Additional Needs Patient/Family Goal for Rehab: Supervision with PT, Supervision and Min Assist with OT, Supervision with SLP Expected length of stay: 10-14 days Cultural Considerations: none Dietary Needs: heart healthy, carb modified,  thin liquids Equipment Needs: to be determined Pt/Family Agrees to Admission and willing to participate:  (spoke with dtr by phone on 10-13 and 10-14) Program Orientation Provided & Reviewed with Pt/Caregiver Including Roles  & Responsibilities: Yes   Decrease burden of Care through IP rehab admission: NA   Possible need for SNF placement upon discharge: not anticipated   Patient Condition: This patient's medical and functional status has changed since the consult dated: 05-02-14 in which the Rehabilitation Physician determined and documented that the patient's condition is appropriate for intensive rehabilitative care in an inpatient rehabilitation facility. See "History of Present Illness" (above) for medical update. Functional changes are: minimal assistance of 2 to ambulate 175 ' with ataxic gait and minimal to moderate assistance with self care tasks. Patient's medical and functional status update has been discussed with the Rehabilitation physician and patient remains appropriate for inpatient rehabilitation. Will admit to inpatient rehab today.  Preadmission Screen Completed By:  Nanetta Batty, PT, 05/04/2014 12:58 PM ______________________________________________________________________   Discussed status with Dr. Letta Pate on 05/04/14 at 1251 and received telephone approval for admission today.  Admission Coordinator:  Nanetta Batty, PT, time 1251/Date 05/04/14

## 2014-05-04 NOTE — Discharge Planning (Signed)
Patient admitted to unit skin intact, no skin breakdown. Patient alert, orientated x4, forgetful. Rehab assessment complete.

## 2014-05-04 NOTE — Progress Notes (Addendum)
Discharge order received. Pt going to inpatient rehab. Discharge instructions reviewed with pt. Education reviewed with pt.   STROKE/TIA DISCHARGE INSTRUCTIONS SMOKING Cigarette smoking nearly doubles your risk of having a stroke & is the single most alterable risk factor  If you smoke or have smoked in the last 12 months, you are advised to quit smoking for your health.  Most of the excess cardiovascular risk related to smoking disappears within a year of stopping.  Ask you doctor about anti-smoking medications  [place] Quit Line: 1-800-QUIT NOW  Free Smoking Cessation Classes [phone]  CHOLESTEROL Know your levels; limit fat & cholesterol in your diet  Lipid Panel     Component Value Date/Time   CHOL 195 04/30/2014 0015   TRIG 88 04/30/2014 0015   HDL 41 04/30/2014 0015   CHOLHDL 4.8 04/30/2014 0015   VLDL 18 04/30/2014 0015   LDLCALC 136* 04/30/2014 0015      Many patients benefit from treatment even if their cholesterol is at goal.  Goal: Total Cholesterol (CHOL) less than 160  Goal:  Triglycerides (TRIG) less than 150  Goal:  HDL greater than 40  Goal:  LDL (LDLCALC) less than 100   BLOOD PRESSURE American Stroke Association blood pressure target is less that 120/80 mm/Hg  Your discharge blood pressure is:  BP: 121/69 mmHg  Monitor your blood pressure  Limit your salt and alcohol intake  Many individuals will require more than one medication for high blood pressure  DIABETES (A1c is a blood sugar average for last 3 months) Goal HGBA1c is under 7% (HBGA1c is blood sugar average for last 3 months)  Diabetes: Diagnosis of diabetes:  Your A1c:11.1 %    Lab Results  Component Value Date   HGBA1C 11.1* 04/30/2014     Your HGBA1c can be lowered with medications, healthy diet, and exercise.  Check your blood sugar as directed by your physician  Call your physician if you experience unexplained or low blood sugars.  PHYSICAL ACTIVITY/REHABILITATION Goal is 30  minutes at least 4 days per week  Activity: Increase activity slowly, Therapies: Inpatient rehab Return to work:   Activity decreases your risk of heart attack and stroke and makes your heart stronger.  It helps control your weight and blood pressure; helps you relax and can improve your mood.  Participate in a regular exercise program.  Talk with your doctor about the best form of exercise for you (dancing, walking, swimming, cycling).  DIET/WEIGHT Goal is to maintain a healthy weight  Your discharge diet is:   Carb mordified/Heart healthy with regular liquids Your height is:  Height: 5\' 6"  (167.6 cm) Your current weight is: Weight: 76.839 kg (169 lb 6.4 oz) Your Body Mass Index (BMI) is:  BMI (Calculated): 27.3  Following the type of diet specifically designed for you will help prevent another stroke.  Your goal weight range is:  115 to 154 lbs.  Your goal Body Mass Index (BMI) is 19-24.  Healthy food habits can help reduce 3 risk factors for stroke:  High cholesterol, hypertension, and excess weight.  RESOURCES Stroke/Support Group:  Call [phone]   STROKE EDUCATION PROVIDED/REVIEWED AND GIVEN TO PATIENT Stroke warning signs and symptoms How to activate emergency medical system (call 911). Medications prescribed at discharge. Need for follow-up after discharge. Personal risk factors for stroke. Pneumonia vaccine given: NO Flu vaccine given: 04/30/2014 My questions have been answered, the writing is legible, and I understand these instructions.  I will adhere to these goals &  educational materials that have been provided to me after my discharge from the hospital.    Diabetes Mellitus and Food It is important for you to manage your blood sugar (glucose) level. Your blood glucose level can be greatly affected by what you eat. Eating healthier foods in the appropriate amounts throughout the day at about the same time each day will help you control your blood glucose level. It  can also help slow or prevent worsening of your diabetes mellitus. Healthy eating may even help you improve the level of your blood pressure and reach or maintain a healthy weight.  HOW CAN FOOD AFFECT ME? Carbohydrates Carbohydrates affect your blood glucose level more than any other type of food. Your dietitian will help you determine how many carbohydrates to eat at each meal and teach you how to count carbohydrates. Counting carbohydrates is important to keep your blood glucose at a healthy level, especially if you are using insulin or taking certain medicines for diabetes mellitus. Alcohol Alcohol can cause sudden decreases in blood glucose (hypoglycemia), especially if you use insulin or take certain medicines for diabetes mellitus. Hypoglycemia can be a life-threatening condition. Symptoms of hypoglycemia (sleepiness, dizziness, and disorientation) are similar to symptoms of having too much alcohol.  If your health care provider has given you approval to drink alcohol, do so in moderation and use the following guidelines:  Women should not have more than one drink per day, and men should not have more than two drinks per day. One drink is equal to:  12 oz of beer.  5 oz of wine.  1 oz of hard liquor.  Do not drink on an empty stomach.  Keep yourself hydrated. Have water, diet soda, or unsweetened iced tea.  Regular soda, juice, and other mixers might contain a lot of carbohydrates and should be counted. WHAT FOODS ARE NOT RECOMMENDED? As you make food choices, it is important to remember that all foods are not the same. Some foods have fewer nutrients per serving than other foods, even though they might have the same number of calories or carbohydrates. It is difficult to get your body what it needs when you eat foods with fewer nutrients. Examples of foods that you should avoid that are high in calories and carbohydrates but low in nutrients include:  Trans fats (most processed foods  list trans fats on the Nutrition Facts label).  Regular soda.  Juice.  Candy.  Sweets, such as cake, pie, doughnuts, and cookies.  Fried foods. WHAT FOODS CAN I EAT? Have nutrient-rich foods, which will nourish your body and keep you healthy. The food you should eat also will depend on several factors, including:  The calories you need.  The medicines you take.  Your weight.  Your blood glucose level.  Your blood pressure level.  Your cholesterol level. You also should eat a variety of foods, including:  Protein, such as meat, poultry, fish, tofu, nuts, and seeds (lean animal proteins are best).  Fruits.  Vegetables.  Dairy products, such as milk, cheese, and yogurt (low fat is best).  Breads, grains, pasta, cereal, rice, and beans.  Fats such as olive oil, trans fat-free margarine, canola oil, avocado, and olives. DOES EVERYONE WITH DIABETES MELLITUS HAVE THE SAME MEAL PLAN? Because every person with diabetes mellitus is different, there is not one meal plan that works for everyone. It is very important that you meet with a dietitian who will help you create a meal plan that is just right  for you. Document Released: 04/04/2005 Document Revised: 07/13/2013 Document Reviewed: 06/04/2013 [facility] Patient Information 2015 [place], [place]. This information is not intended to replace advice given to you by your health care provider. Make sure you discuss any questions you have with your health care provider.  Stroke Prevention Some medical conditions and behaviors are associated with an increased chance of having a stroke. You may prevent a stroke by making healthy choices and managing medical conditions. HOW CAN I REDUCE MY RISK OF HAVING A STROKE?  Stay physically active. Get at least 30 minutes of activity on most or all days. Do not smoke. It may also be helpful to avoid exposure to secondhand smoke. Limit alcohol use. Moderate alcohol use is considered to be: No more  than 2 drinks per day for men. No more than 1 drink per day for nonpregnant women. Eat healthy foods. This involves: Eating 5 or more servings of fruits and vegetables a day. Making dietary changes that address high blood pressure (hypertension), high cholesterol, diabetes, or obesity. Manage your cholesterol levels. Making food choices that are high in fiber and low in saturated fat, trans fat, and cholesterol may control cholesterol levels. Take any prescribed medicines to control cholesterol as directed by your health care provider. Manage your diabetes. Controlling your carbohydrate and sugar intake is recommended to manage diabetes. Take any prescribed medicines to control diabetes as directed by your health care provider. Control your hypertension. Making food choices that are low in salt (sodium), saturated fat, trans fat, and cholesterol is recommended to manage hypertension. Take any prescribed medicines to control hypertension as directed by your health care provider. Maintain a healthy weight. Reducing calorie intake and making food choices that are low in sodium, saturated fat, trans fat, and cholesterol are recommended to manage weight. Stop drug abuse. Avoid taking birth control pills. Talk to your health care provider about the risks of taking birth control pills if you are over 78 years old, smoke, get migraines, or have ever had a blood clot. Get evaluated for sleep disorders (sleep apnea). Talk to your health care provider about getting a sleep evaluation if you snore a lot or have excessive sleepiness. Take medicines only as directed by your health care provider. For some people, aspirin or blood thinners (anticoagulants) are helpful in reducing the risk of forming abnormal blood clots that can lead to stroke. If you have the irregular heart rhythm of atrial fibrillation, you should be on a blood thinner unless there is a good reason you cannot take them. Understand all your  medicine instructions. Make sure that other conditions (such as anemia or atherosclerosis) are addressed. SEEK IMMEDIATE MEDICAL CARE IF:  You have sudden weakness or numbness of the face, arm, or leg, especially on one side of the body. Your face or eyelid droops to one side. You have sudden confusion. You have trouble speaking (aphasia) or understanding. You have sudden trouble seeing in one or both eyes. You have sudden trouble walking. You have dizziness. You have a loss of balance or coordination. You have a sudden, severe headache with no known cause. You have new chest pain or an irregular heartbeat. Any of these symptoms may represent a serious problem that is an emergency. Do not wait to see if the symptoms will go away. Get medical help at once. Call your local emergency services (911 in [place] Do not drive yourself to the hospital. Document Released: 08/15/2004 Document Revised: 11/22/2013 Document Reviewed: 01/08/2013 [facility] Patient Information 2015 [employer]. This information is not intended to replace advice given to you by your health care provider. Make sure you discuss any questions you have with your health care provider.

## 2014-05-05 ENCOUNTER — Inpatient Hospital Stay (HOSPITAL_COMMUNITY): Payer: Self-pay | Admitting: Occupational Therapy

## 2014-05-05 ENCOUNTER — Inpatient Hospital Stay (HOSPITAL_COMMUNITY): Payer: Self-pay | Admitting: Speech Pathology

## 2014-05-05 ENCOUNTER — Inpatient Hospital Stay (HOSPITAL_COMMUNITY): Payer: BC Managed Care – PPO | Admitting: Physical Therapy

## 2014-05-05 DIAGNOSIS — I639 Cerebral infarction, unspecified: Secondary | ICD-10-CM

## 2014-05-05 DIAGNOSIS — I69920 Aphasia following unspecified cerebrovascular disease: Secondary | ICD-10-CM

## 2014-05-05 DIAGNOSIS — I6340 Cerebral infarction due to embolism of unspecified cerebral artery: Secondary | ICD-10-CM

## 2014-05-05 LAB — COMPREHENSIVE METABOLIC PANEL
ALT: 21 U/L (ref 0–53)
AST: 26 U/L (ref 0–37)
Albumin: 3.7 g/dL (ref 3.5–5.2)
Alkaline Phosphatase: 74 U/L (ref 39–117)
Anion gap: 13 (ref 5–15)
BUN: 9 mg/dL (ref 6–23)
CALCIUM: 9.4 mg/dL (ref 8.4–10.5)
CO2: 26 meq/L (ref 19–32)
CREATININE: 0.76 mg/dL (ref 0.50–1.35)
Chloride: 101 mEq/L (ref 96–112)
GFR calc Af Amer: >90 mL/min (ref >90)
GLUCOSE: 87 mg/dL (ref 70–99)
Potassium: 3.6 mEq/L — ABNORMAL LOW (ref 3.7–5.3)
SODIUM: 140 meq/L (ref 137–147)
TOTAL PROTEIN: 7.7 g/dL (ref 6.0–8.3)
Total Bilirubin: 0.4 mg/dL (ref 0.3–1.2)

## 2014-05-05 LAB — CBC WITH DIFFERENTIAL/PLATELET
Basophils Absolute: 0.1 10*3/uL (ref 0.0–0.1)
Basophils Relative: 1 % (ref 0–1)
EOS ABS: 0.2 10*3/uL (ref 0.0–0.7)
Eosinophils Relative: 3 % (ref 0–5)
HCT: 40.9 % (ref 39.0–52.0)
Hemoglobin: 13.9 g/dL (ref 13.0–17.0)
LYMPHS ABS: 2.5 10*3/uL (ref 0.7–4.0)
Lymphocytes Relative: 39 % (ref 12–46)
MCH: 29 pg (ref 26.0–34.0)
MCHC: 34 g/dL (ref 30.0–36.0)
MCV: 85.4 fL (ref 78.0–100.0)
MONO ABS: 0.5 10*3/uL (ref 0.1–1.0)
Monocytes Relative: 8 % (ref 3–12)
Neutro Abs: 3 10*3/uL (ref 1.7–7.7)
Neutrophils Relative %: 49 % (ref 43–77)
PLATELETS: 376 10*3/uL (ref 150–400)
RBC: 4.79 MIL/uL (ref 4.22–5.81)
RDW: 12.6 % (ref 11.5–15.5)
WBC: 6.3 10*3/uL (ref 4.0–10.5)

## 2014-05-05 LAB — GLUCOSE, CAPILLARY
GLUCOSE-CAPILLARY: 88 mg/dL (ref 70–99)
GLUCOSE-CAPILLARY: 135 mg/dL — AB (ref 70–99)
Glucose-Capillary: 129 mg/dL — ABNORMAL HIGH (ref 70–99)
Glucose-Capillary: 84 mg/dL (ref 70–99)

## 2014-05-05 LAB — BETA-2-GLYCOPROTEIN I ABS, IGG/M/A
BETA-2-GLYCOPROTEIN I IGA: 5 A Units (ref <20)
BETA-2-GLYCOPROTEIN I IGM: 3 M Units (ref <20)
Beta-2 Glyco I IgG: 0 G Units (ref <20)

## 2014-05-05 MED ORDER — HYDROCERIN EX CREA
TOPICAL_CREAM | Freq: Two times a day (BID) | CUTANEOUS | Status: DC
  Administered 2014-05-05 – 2014-05-06 (×2): via TOPICAL
  Administered 2014-05-06: 1 via TOPICAL
  Administered 2014-05-07: 20:00:00 via TOPICAL
  Administered 2014-05-07: 1 via TOPICAL
  Administered 2014-05-08 – 2014-05-10 (×4): via TOPICAL
  Administered 2014-05-10: 1 via TOPICAL
  Administered 2014-05-11 – 2014-05-12 (×3): via TOPICAL
  Administered 2014-05-12: 1 via TOPICAL
  Administered 2014-05-13 – 2014-05-17 (×9): via TOPICAL
  Filled 2014-05-05 (×2): qty 113

## 2014-05-05 MED ORDER — CAMPHOR-MENTHOL 0.5-0.5 % EX LOTN
TOPICAL_LOTION | CUTANEOUS | Status: DC | PRN
  Administered 2014-05-05: 19:00:00 via TOPICAL
  Filled 2014-05-05: qty 222

## 2014-05-05 NOTE — Progress Notes (Signed)
Social Work Assessment and Plan Social Work Assessment and Plan  Patient Details  Name: Adam Callahan MRN: [id number] Date of Birth: 06/28/1961  Today's Date: 05/05/2014  Problem List:  Patient Active Problem List   Diagnosis Date Noted  . Acute left hemiparesis 05/04/2014  . Stroke 04/30/2014  . Spastic hemiplegia affecting dominant side 10/01/2013  . Aphasia as late effect of cerebrovascular accident 10/01/2013  . 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 Dehydration 06/07/2013  . Hypokalemia 06/07/2013  . Dyslipidemia 06/07/2013  . CVA (cerebral infarction) 06/06/2013  . Diabetes mellitus type 2, uncontrolled 06/06/2013  . Rhabdomyolysis 06/06/2013   Past Medical History:  Past Medical History  Diagnosis Date  . Diabetes mellitus   . Hypertension    Past Surgical History:  Past Surgical History  Procedure Laterality Date  . Tee without cardioversion N/A 05/03/2014    Procedure: TRANSESOPHAGEAL ECHOCARDIOGRAM (TEE);  Surgeon: Pixie Casino, MD;  Location: [facility] ENDOSCOPY;  Service: Cardiovascular;  Laterality: N/A;   Social History:  reports that he has quit smoking. He does not have any smokeless tobacco history on file. He reports that he does not drink alcohol or use illicit drugs.  Family / Support Systems Marital Status: Divorced Patient Roles: Other (Comment) (Father) Children: Tory Septer  [phone] Other Supports: Shirley-ex-wife & Rev Williams-friend Anticipated Caregiver: Daughter and Enid Derry Ability/Limitations of Caregiver: daughter works but is working on Probation officer: Other (Comment) (Aware he will need 24 hr care) Family Dynamics: Daughter was not in the picture much since lives further away and her child has health issues.  Pt has an Aunt here and his friend Rev [place].  He has very limited supports here and hopefully with going  to his daughter's he will more support and be looked out for.  Social History Preferred language: English Religion: Holiness Cultural Background: No issues Education: High School Read: Yes Write: Yes Employment Status: Employed Name of Employer: [employer] Return to Work Plans: Has not returned since last Oct 2014 after first stroke Freight forwarder Issues: Has an attorney from the last stroke feels was caused by falling and hitting his head-he feels this caused his stroke last year. Guardian/Conservator: None-according to MD pt is not capable of making his own decisions at this time.  Will look toward his daughter since she is next of kin and no formal POA.   Abuse/Neglect Physical Abuse: Denies Verbal Abuse: Denies Sexual Abuse: Denies Exploitation of patient/patient's resources: Denies Self-Neglect: Denies  Emotional Status Pt's affect, behavior adn adjustment status: Pt is confusing this time in the hospital with last year.  He wants to get back to his home, but will go to daughter's home for a short time.  His local family did not treat him well and was more concerned with getting paid then pt's care.  he reports doing well and recovering form his first stroke, but had yet to go back to work. Recent Psychosocial Issues: Other health issues-could not afford his meds and was not taking and his aunt did not set him up with a PCP to follow him last year. Pyschiatric History: No history-pt would benefit from Neuro-psych to see while here due to all of the stressors he has and now this second stroke.  Will follow to provide support and advocate for him. Substance Abuse History: Quit tobacco after last stroke  Patient / Family Perceptions, Expectations & Goals Pt/Family understanding of illness &  functional limitations: Pt can explain this stroke, he doesn't recognize his cognitive deficits.  He reports he was moving well and was living alone again.  He is focused on  getting back to his place, but is willing to go to daughter's home.  His daughter has a basic understanding of dad's condition.  She has yet to come due to car issues.  But plans to once car fixed. Premorbid pt/family roles/activities: Father, Employee, Friend, etc Anticipated changes in roles/activities/participation: resume Pt/family expectations/goals: Pt states: " I want to be independent again, but will go to my dauhgter's home."  Daughter states: " He needs someone to look out for him, I'm a CNA I can do what he needs."  [employer]: Other (Comment) Premorbid Home Care/DME Agencies: Other (Comment) ([facility] & OP last year) Transportation available at discharge: Friend Resource referrals recommended: Support group (specify);Neuropsychology (CVA SUpport group)  Discharge Planning Living Arrangements: Alone Support Systems: Children;Other relatives;Friends/neighbors Type of Residence: Private residence 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  Daughter has been having car issues and can not get up here to see him.  Disucssed he will need a PCP prior to discharge and SSD needs to applied for.  Daughter thinks an applicaiton has been submitted, will check on.  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   Social Work Anticipated Follow Up Needs: HH/OP;Support Group  Clinical Impression This pt is known to this worker from last year's admit to rehab.  He is pleasant  but with his cognitive deficits he needs someone to look out for him.  Daughter planning to do this when he goes to her home. Will try to clarify his leave issues with the school system and if a disability application has been completed. Will set him up with a PCP and see if his health insurance is still valid.  Local Aunt did not look out For his best interests, so will not work with her if she were to show up.  Pt would benefit from Neuro-psych seeing him while here.   Elease Hashimoto 05/05/2014, 12:13 PM

## 2014-05-05 NOTE — Progress Notes (Signed)
Meredith Staggers, MD Physician Signed Physical Medicine and Rehabilitation Consult Note Service date: 05/02/2014 8:48 AM  Related encounter: Admission (Discharged) from 04/29/2014 in [facility]           Physical Medicine and Rehabilitation Consult Reason for Consult: CVA Referring Physician: [facility]     HPI: Adam Callahan is a 54 y.o. 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 Patient lives alone and used walker prior to admission. Admitted 04/30/2014 with blurred vision and headache. 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 MRA of the brain with chronic occlusion of left PCA. Patient did not receive TPA. Echocardiogram with ejection fraction 54% grade 1 diastolic dysfunction. Carotid Dopplers with no ICA stenosis. Neurology consulted maintained on aspirin and Plavix for CVA prophylaxis. Subcutaneous heparin for DVT prophylaxis. Hemoglobin A1c 11.3 with insulin therapy as directed. Physical therapy evaluation completed 05/01/2014 with recommendations for physical medicine rehabilitation consult     Review of Systems  Eyes: Positive for blurred vision.  Gastrointestinal: Positive for constipation.  Musculoskeletal: Positive for myalgias.  Neurological: Positive for speech change and weakness.  All other systems reviewed and are negative. Past Medical History   Diagnosis  Date   .  Diabetes mellitus      No past surgical history on file. Family History   Problem  Relation  Age of Onset   .  Cervical cancer  Mother     .  Hypertension  Sister     .  Diabetes Mellitus II  Maternal Uncle      Social History: reports that he has quit smoking. He does not have any smokeless tobacco history on file. He  reports that he does not drink alcohol or use illicit drugs. Allergies: No Known Allergies Medications Prior to Admission   Medication  Sig  Dispense  Refill   .  aspirin 81 MG chewable tablet  Chew 1 tablet (81 mg total) by mouth daily.         Marland Kitchen  atorvastatin (LIPITOR) 20 MG tablet  Take 1 tablet (20 mg total) by mouth daily at 6 PM.   30 tablet   1   .  clopidogrel (PLAVIX) 75 MG tablet  Take 1 tablet (75 mg total) by mouth daily with breakfast.   30 tablet   1   .  insulin aspart (NOVOLOG) 100 UNIT/ML injection  Inject 6 Units into the skin 3 (three) times daily with meals.   1 vial   12   .  insulin glargine (LANTUS) 100 UNIT/ML injection  Inject 0.25 mLs (25 Units total) into the skin daily.   10 mL   12   .  traMADol (ULTRAM) 50 MG tablet  Take 50 mg by mouth every 6 (six) hours as needed for moderate pain.            Home: Home Living Family/patient expects to be discharged to:: Private residence Living Arrangements: Alone Available Help at Discharge: Family;Friend(s);Available 24 hours/day Type of Home: House Home Access: Other (comment) (threshold to porch and inside house) Home Layout: One level Home Equipment: Cane - quad;Wheelchair - manual;Shower seat   Functional History: Prior Function Level of Independence: Independent with assistive device(s) Functional Status:   Mobility: Bed Mobility Overal bed mobility: Needs Assistance Bed Mobility:  Supine to Sit;Sit to Supine Supine to sit: Mod assist;Supervision Sit to supine: Supervision General bed mobility comments: Intially requiring assist to come to EOB, then able to perform later with no physical assist and with bed rail. Transfers Overall transfer level: Needs assistance Equipment used: 1 person hand held assist Transfers: Sit to/from Stand Sit to Stand: Min assist;Mod assist Stand pivot transfers: Min assist Ambulation/Gait Ambulation/Gait assistance: +2 physical assistance;Min assist Ambulation Distance  (Feet): 75 Feet Assistive device: 2 person hand held assist Gait Pattern/deviations: Steppage;Decreased stance time - left (steppage on right) Gait velocity: decreased   ADL: ADL Overall ADL's : Needs assistance/impaired Eating/Feeding: Set up;Sitting Grooming: Oral care;Wash/dry face;Applying deodorant;Minimal assistance;Standing Upper Body Bathing: Standing;Moderate assistance Lower Body Bathing: Min guard;Sit to/from stand Upper Body Dressing : Sitting;Standing;Minimal assistance Lower Body Dressing: Sit to/from stand;Minimal assistance Toilet Transfer: +2 for physical assistance;Moderate assistance;Ambulation (bed/chair) Toileting- Clothing Manipulation and Hygiene: Sit to/from stand;Minimal assistance Functional mobility during ADLs: +2 for physical assistance;Moderate assistance (hand held assist) General ADL Comments: Pt performed some ADLs at sink. +2 assist needed for ambulation. Spoke about safety. Encouraged pt to try to use RUE to support items if able. Stood at sink with Min guard assist for balance. Discussed UB dressing technique.   Cognition: Cognition Overall Cognitive Status: History of cognitive impairments - at baseline Orientation Level: Oriented to person;Oriented to place;Oriented to situation;Disoriented to time Cognition Arousal/Alertness: Awake/alert Behavior During Therapy: WFL for tasks assessed/performed Overall Cognitive Status: History of cognitive impairments - at baseline   Blood pressure 116/85, pulse 61, temperature 98.6 F (37 C), temperature source Oral, resp. rate 16, height 5\' 6"  (1.676 m), weight 74.777 kg (164 lb 13.7 oz), SpO2 98.00%. Physical Exam  Vitals reviewed. Constitutional: He is oriented to person, place, and time. He appears well-developed.  HENT:  Right facial droop  Eyes: EOM are normal.  Neck: Normal range of motion. Neck supple. No thyromegaly present.  Cardiovascular: Normal rate and regular rhythm.   Respiratory:  Effort normal and breath sounds normal. No respiratory distress.  GI: Soft. Bowel sounds are normal. He exhibits no distension.  Neurological: He is alert and oriented to person, place, and time.  Speech is dysarthric but intelligible. He does follow simple commands. Fair awareness of his deficits. RUE 2/5 prox to distal, inconsistent. LUE 4/5. LE's 3+ to 4-HF to 4/5 ankles.  Has difficulty with attention and sequencing. Had no overt weakness or FMC issues in the left arm and leg. ?visual acuity inconsistent.   Skin: Skin is warm and dry.  Psychiatric: He has a normal mood and affect. His behavior is normal. Thought content normal.     Results for orders placed during the hospital encounter of 04/29/14 (from the past 24 hour(s))   GLUCOSE, CAPILLARY     Status: Abnormal     Collection Time      05/01/14 11:48 AM       Result  Value  Ref Range     Glucose-Capillary  226 (*)  70 - 99 mg/dL   GLUCOSE, CAPILLARY     Status: Abnormal     Collection Time      05/01/14  4:57 PM       Result  Value  Ref Range     Glucose-Capillary  220 (*)  70 - 99 mg/dL   GLUCOSE, CAPILLARY     Status: Abnormal     Collection Time      05/01/14  8:52 PM       Result  Value  Ref Range     Glucose-Capillary  174 (*)  70 - 99 mg/dL     Comment 1  Notify RN      COMPREHENSIVE METABOLIC PANEL     Status: Abnormal     Collection Time      05/02/14  4:05 AM       Result  Value  Ref Range     Sodium  139   137 - 147 mEq/L     Potassium  3.3 (*)  3.7 - 5.3 mEq/L     Chloride  99   96 - 112 mEq/L     CO2  27   19 - 32 mEq/L     Glucose, Bld  147 (*)  70 - 99 mg/dL     BUN  12   6 - 23 mg/dL     Creatinine, Ser  0.75   0.50 - 1.35 mg/dL     Calcium  9.0   8.4 - 10.5 mg/dL     Total Protein  7.4   6.0 - 8.3 g/dL     Albumin  3.5   3.5 - 5.2 g/dL     AST  15   0 - 37 U/L     ALT  12   0 - 53 U/L     Alkaline Phosphatase  76   39 - 117 U/L     Total Bilirubin  0.7   0.3 - 1.2 mg/dL     GFR calc non Af Amer  >90    >90 mL/min     GFR calc Af Amer  >90   >90 mL/min     Anion gap  13   5 - 15   GLUCOSE, CAPILLARY     Status: Abnormal     Collection Time      05/02/14  7:34 AM       Result  Value  Ref Range     Glucose-Capillary  155 (*)  70 - 99 mg/dL    Mr Virgel Paling Wo Contrast   04/30/2014   CLINICAL DATA:  53 year old male with acute blurred vision and right facial pain diagnosed with right PCA infarct yesterday by MRI. Initial encounter.  EXAM: MRA HEAD WITHOUT CONTRAST  TECHNIQUE: Angiographic images of the Circle of Willis were obtained using MRA technique without intravenous contrast.  COMPARISON:  Brain MRI 11/27/2013. Neck and intracranial MRA 06/07/2013.  FINDINGS: Improved antegrade flow in the distal left vertebral artery, which now appears dominant compared to that on the right. The distal right vertebral functionally terminates in PICA as before. Improved flow at the vertebrobasilar junction. Patent AICA origins. No basilar artery stenosis. SCA origins remain patent, the right now appears irregular.  The left PCA remains occluded. Fetal type right PCA origin AND duplicated right posterior communicating artery is re - identified (separate supply of the right superior and inferior PCA territories. Both right posterior communicating artery are patent. The superior right PCA division is patent. However, the inferior division branch (supplied by the right Pcomm which has a more cephalad coarse) now is occluded in the equivalent of the right P2 segment (arrow on series 204, image 19).  Antegrade flow in both ICA siphons which are stable and without stenosis. Ophthalmic and right posterior communicating artery origins remain normal. Patent carotid termini. MCA and ACA origins are within normal limits. Anterior communicating artery with median artery of the corpus callosum, and visualized bilateral ACA branches are stable and within  normal limits (degraded by motion today). Visualized bilateral MCA branches are  stable, with chronic moderate irregularity and stenosis at the origin of the middle sylvian division as seen in 2014. No major MCA branch occlusion.  IMPRESSION: 1. Anatomic variation of the right PCA supply, with "duplicated" fetal PCAs such that the inferior and superior PCA divisions are supplied by separate right posterior communicating arteries. 2. The inferior PCA division is occluded in the "P2" equivalent segment. The superior PCA division remains normal. 3. Chronic occlusion of the left PCA. 4. Interval improved distal left vertebral artery, such that that vessel now appears dominant. 5. Stable anterior circulation with chronic moderate stenosis at the origin of a left MCA M2 branch.   Electronically Signed   By: Lars Pinks M.D.   On: 04/30/2014 15:02     Assessment/Plan: Diagnosis: New right PCA infarct, prior left PCA infarct Does the need for close, 24 hr/day medical supervision in concert with the patient's rehab needs make it unreasonable for this patient to be served in a less intensive setting? Yes Co-Morbidities requiring supervision/potential complications: dm, right hemiparesis Due to bladder management, bowel management, safety, skin/wound care, disease management, medication administration, pain management and patient education, does the patient require 24 hr/day rehab nursing? Yes Does the patient require coordinated care of a physician, rehab nurse, PT (1-2 hrs/day, 5 days/week), OT (1-2 hrs/day, 5 days/week) and SLP (1-2 hrs/day, 5 days/week) to address physical and functional deficits in the context of the above medical diagnosis(es)? Yes Addressing deficits in the following areas: balance, endurance, locomotion, strength, transferring, bowel/bladder control, bathing, dressing, feeding, grooming, toileting, cognition, language and psychosocial support Can the patient actively participate in an intensive therapy program of at least 3 hrs of therapy per day at least 5 days per week?  Yes The potential for patient to make measurable gains while on inpatient rehab is good Anticipated functional outcomes upon discharge from inpatient rehab are supervision with PT, supervision and min assist with OT, supervision with SLP. Estimated rehab length of stay to reach the above functional goals is: 10-14 days Does the patient have adequate social supports to accommodate these discharge functional goals? Yes and Potentially Anticipated D/C setting: Home Anticipated post D/C treatments: HH therapy Overall Rehab/Functional Prognosis: excellent   RECOMMENDATIONS: This patient's condition is appropriate for continued rehabilitative care in the following setting: CIR Patient has agreed to participate in recommended program. Yes Note that insurance prior authorization may be required for reimbursement for recommended care.   Comment: Rehab Admissions Coordinator to follow up.   Thanks,   Meredith Staggers, MD, Mellody Drown         05/02/2014    Revision History...     Date/Time User Action   05/02/2014 9:32 AM Meredith Staggers, MD Sign   05/02/2014 9:05 AM Cathlyn Parsons, PA-C Pend  View Details Report   Routing History...     Date/Time From To Method   05/02/2014 9:32 AM Meredith Staggers, MD Meredith Staggers, MD In Basket   05/02/2014 9:32 AM Meredith Staggers, MD No Pcp Per Patient In Basket

## 2014-05-05 NOTE — Progress Notes (Signed)
Social Work [name], male   DOB: 1961/06/06, 53 y.o.   MRN: [id number] Spoke with daughter to inform [employer] needs to speak with her regarding forms and LTD papers to be completed. Gave her Butch Penny [phone] to contact to discuss what is needed for pt.  She reports she will follow up with this.

## 2014-05-05 NOTE — Progress Notes (Signed)
53 y.o. 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 Patient noncompliant with medications at home due to financial issues. Patient lives alone and used walker prior to admission. Admitted 04/30/2014 with blurred vision and headache. 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 MRA of the brain with chronic occlusion of left PCA. Patient did not receive TPA. Echocardiogram with ejection fraction 02% grade 1 diastolic dysfunction. Carotid Dopplers with no ICA stenosis.Lower Extremity Dopplers negative DVT. TEE completed showing no LAA thrombus and small incidental findings of small PFO. No plan for loop recorder. Neurology consulted maintained on aspirin and Plavix for CVA prophylaxis  Subjective/Complaints: No issues overnite Asking about Rigth sided weakness We discussed this is unlikely to improve since CVA was in 06/2013  Objective: Vital Signs: Blood pressure 129/81, pulse 81, temperature 97.6 F (36.4 C), temperature source Oral, resp. rate 18, weight 75.932 kg (167 lb 6.4 oz), SpO2 100.00%. No results found. Results for orders placed during the hospital encounter of 05/04/14 (from the past 72 hour(s))  GLUCOSE, CAPILLARY     Status: Abnormal   Collection Time    05/04/14  8:23 PM      Result Value Ref Range   Glucose-Capillary 114 (*) 70 - 99 mg/dL  CBC WITH DIFFERENTIAL     Status: None   Collection Time    05/05/14  5:00 AM      Result Value Ref Range   WBC 6.3  4.0 - 10.5 K/uL   Comment: WHITE COUNT CONFIRMED ON SMEAR   RBC 4.79  4.22 - 5.81 MIL/uL   Hemoglobin 13.9  13.0 - 17.0 g/dL   HCT 40.9  39.0 - 52.0 %   MCV 85.4  78.0 - 100.0 fL   MCH 29.0  26.0 - 34.0 pg   MCHC 34.0  30.0 - 36.0 g/dL   RDW 12.6  11.5 - 15.5 %   Platelets 376   150 - 400 K/uL   Neutrophils Relative % 49  43 - 77 %   Lymphocytes Relative 39  12 - 46 %   Monocytes Relative 8  3 - 12 %   Eosinophils Relative 3  0 - 5 %   Basophils Relative 1  0 - 1 %   Neutro Abs 3.0  1.7 - 7.7 K/uL   Lymphs Abs 2.5  0.7 - 4.0 K/uL   Monocytes Absolute 0.5  0.1 - 1.0 K/uL   Eosinophils Absolute 0.2  0.0 - 0.7 K/uL   Basophils Absolute 0.1  0.0 - 0.1 K/uL   Smear Review MORPHOLOGY UNREMARKABLE    COMPREHENSIVE METABOLIC PANEL     Status: Abnormal   Collection Time    05/05/14  5:00 AM      Result Value Ref Range   Sodium 140  137 - 147 mEq/L   Potassium 3.6 (*) 3.7 - 5.3 mEq/L   Chloride 101  96 - 112 mEq/L   CO2 26  19 - 32 mEq/L   Glucose, Bld 87  70 - 99 mg/dL   BUN 9  6 - 23 mg/dL   Creatinine, Ser 0.76  0.50 - 1.35 mg/dL   Calcium 9.4  8.4 - 10.5 mg/dL   Total Protein 7.7  6.0 - 8.3 g/dL   Albumin 3.7  3.5 - 5.2 g/dL   AST 26  0 - 37 U/L   Comment: HEMOLYSIS AT THIS LEVEL MAY AFFECT RESULT   ALT 21  0 - 53 U/L   Alkaline Phosphatase 74  39 - 117 U/L   Total Bilirubin 0.4  0.3 - 1.2 mg/dL   GFR calc non Af Amer >90  >90 mL/min   GFR calc Af Amer >90  >90 mL/min   Comment: (NOTE)     The eGFR has been calculated using the CKD EPI equation.     This calculation has not been validated in all clinical situations.     eGFR's persistently <90 mL/min signify possible Chronic Kidney     Disease.   Anion gap 13  5 - 15  GLUCOSE, CAPILLARY     Status: None   Collection Time    05/05/14  6:34 AM      Result Value Ref Range   Glucose-Capillary 88  70 - 99 mg/dL      Physical Exam  Constitutional: He is oriented to person, place, and time. He appears well-developed.  HENT:  Right facial droop  Eyes: EOM are normal.  Neck: Normal range of motion. Neck supple. No thyromegaly present.  Cardiovascular: Normal rate and regular rhythm.  Respiratory: Effort normal and breath sounds normal. No respiratory distress.  GI: Soft. Bowel sounds are normal. He  exhibits no distension.  Neurological: He is alert and oriented to person, place, and time.  Speech is dysarthric but intelligible. He does follow simple commands. Fair awareness of his deficits. RUE 2/5 prox to distal, inconsistent. LUE 4/5. LE's 4-HF to 4/5 ankles. Has difficulty with attention and sequencing. Had no overt weakness or FMC issues in the left arm and leg. ?visual acuity inconsistent.  Skin: Skin is warm and dry.  Psychiatric: He has a normal mood and affect. His behavior is normal. Thought content normal   Assessment/Plan: 1. 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 Physiatrist is providing close team supervision and 24 hour management of active medical problems listed below. Physiatrist and rehab team continue to assess barriers to discharge/monitor patient progress toward functional and medical goals. FIM:                   Comprehension Comprehension Mode: Auditory Comprehension: 5-Follows basic conversation/direction: With no assist  Expression Expression Mode: Verbal Expression: 5-Expresses basic needs/ideas: With no assist  Social Interaction Social Interaction: 7-Interacts appropriately with others - No medications needed.  Problem Solving Problem Solving: 4-Solves basic 75 - 89% of the time/requires cueing 10 - 24% of the time  Memory Memory: 4-Recognizes or recalls 75 - 89% of the time/requires cueing 10 - 24% of the time  Medical Problem List and Plan:  1. 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 DVT Prophylaxis/Anticoagulation: Subcutaneous heparin. Monitor platelet counts any signs of bleeding  3. Pain Management: Ultram as needed. Monitor with increased mobility  4. Diabetes  mellitus with peripheral neuropathy. Hemoglobin A1c 11.3. NovoLog 4 units 3 times a day, Lantus insulin 20 units daily. Check CBGs a.c. and at bedtime.  5. Neuropsych: This patient is not capable of making decisions on his own behalf.  6. Skin/Wound Care: Routine skin checks  7. Fluids/Electrolytes/Nutrition: Strict I and O.'s. Followup chemistries. Provide nutritional supplements as needed  8. Hyperlipidemia. Lipitor  9. Poor medical compliance due to financial constraints. Follow financial counselor   LOS (Days) Adam Callahan 05/05/2014, 11:38 AM

## 2014-05-05 NOTE — Evaluation (Signed)
Physical Therapy Assessment and Plan  Patient Details  Name: Adam Callahan MRN: [id number] Date of Birth: Apr 25, 1961  PT Diagnosis: Abnormal posture, Abnormality of gait, Ataxic gait, Cognitive deficits, Hemiplegia dominant, Impaired sensation and Muscle weakness Rehab Potential: Good ELOS: 7-10 days   Today's Date: 05/05/2014 PT Individual Time: 0830-0930 PT Individual Time Calculation (min): 60 min    Problem List:  Patient Active Problem List   Diagnosis Date Noted  . Acute left hemiparesis 05/04/2014  . Stroke 04/30/2014  . Spastic hemiplegia affecting dominant side 10/01/2013  . Aphasia as late effect of cerebrovascular accident 10/01/2013  . 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 Dehydration 06/07/2013  . Hypokalemia 06/07/2013  . Dyslipidemia 06/07/2013  . CVA (cerebral infarction) 06/06/2013  . Diabetes mellitus type 2, uncontrolled 06/06/2013  . Rhabdomyolysis 06/06/2013    Past Medical History:  Past Medical History  Diagnosis Date  . Diabetes mellitus   . Hypertension    Past Surgical History:  Past Surgical History  Procedure Laterality Date  . Tee without cardioversion N/A 05/03/2014    Procedure: TRANSESOPHAGEAL ECHOCARDIOGRAM (TEE);  Surgeon: Pixie Casino, MD;  Location[facility] ENDOSCOPY;  Service: Cardiovascular;  Laterality: N/A;    Assessment & Plan Clinical Impression: Adam Callahan is a 53 y.o. 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 Patient noncompliant with medications at home due to financial issues. Patient lives alone and used walker prior to admission. Admitted 04/30/2014 with blurred vision and headache. MRI shows acute right PCA territory infarct as well as extensive  chronic left PCA infarct with encephalomalacia. MRA of the brain with chronic occlusion of left PCA. Patient did not receive TPA. Echocardiogram with ejection fraction 70% grade 1 diastolic dysfunction. Carotid Dopplers with no ICA stenosis.Lower Extremity Dopplers negative DVT. TEE completed showing no LAA thrombus and small incidental findings of small PFO. No plan for loop recorder. Neurology consulted maintained on aspirin and Plavix for CVA prophylaxis. Subcutaneous heparin for DVT prophylaxis. Hemoglobin A1c 11.3 with insulin therapy as directed. Patient transferred to CIR on 05/04/2014.   Patient currently requires mod with mobility secondary to muscle weakness, decreased cardiorespiratoy endurance, impaired timing and sequencing, abnormal tone, unbalanced muscle activation, ataxia, decreased coordination and decreased motor planning, field cut, and decreased awareness, decreased problem solving, decreased safety awareness and decreased memory.  Prior to hospitalization, patient was modified independent  with mobility and lived with Alone in a House home.  Home access is  Other (comment) (threshold to porch and inside house).  Patient will benefit from skilled PT intervention to maximize safe functional mobility, minimize fall risk and decrease caregiver burden for planned discharge home with 24 hour supervision.  Anticipate patient will benefit from follow up [facility] at discharge.  PT - End of Session Activity Tolerance: Tolerates 30+ min activity with multiple rests Endurance Deficit: Yes PT Assessment Rehab Potential: Good Barriers to Discharge: Decreased caregiver support PT Patient demonstrates impairments in the following area(s): Balance;Edema;Endurance;Motor;Nutrition;Perception;Safety;Sensory PT Transfers Functional Problem(s): Bed Mobility;Bed to Chair;Car;Furniture PT Locomotion Functional Problem(s): Wheelchair Mobility;Stairs;Ambulation PT Plan PT Intensity: Minimum of 1-2 x/day ,45 to  90 minutes PT Frequency: 5 out of 7 days PT Duration Estimated Length of Stay: 7-10 days PT Treatment/Interventions: Ambulation/gait training;Balance/vestibular training;Discharge planning;Disease management/prevention;DME/adaptive equipment instruction;Functional mobility training;Neuromuscular re-education;Patient/family education;Stair training;Therapeutic Activities;Therapeutic Exercise;UE/LE Strength taining/ROM;UE/LE Coordination  activities;Wheelchair propulsion/positioning PT Transfers Anticipated Outcome(s): supervision PT Locomotion Anticipated Outcome(s): supervision PT Recommendation Follow Up Recommendations: Home health PT Patient destination: Home Equipment Recommended: To be determined  Skilled Therapeutic Intervention Skilled therapeutic intervention initiated after completion of evaluation. Discussed with patient falls risk, safety within room, and focus of therapy during stay. Discussed possible LOS, goals, and f/u therapy. Deficits on evaluation appear to be chronic in nature from prior L PCA infarct. Patient is poor historian and would benefit from family attending therapies to determine baseline.   PT Evaluation Precautions/Restrictions Precautions Precautions: Fall Precaution Comments: R hemiplegia from prior CVA Restrictions Weight Bearing Restrictions: No General Chart Reviewed: Yes Family/Caregiver Present: No Vital Signs  Pain Pain Assessment Pain Assessment: 0-10 Pain Score: 0-No pain Home Living/Prior Functioning Home Living Living Arrangements: Alone Available Help at Discharge: Family;Friend(s);Available PRN/intermittently Type of Home: House Home Access: Other (comment) (threshold to porch and inside house) Home Layout: One level Additional Comments: Patient reports he can stay with daughter who lives in [place] for a short period of time  Lives With: Alone Prior Function Level of Independence: Independent with gait;Independent with basic ADLs   Able to Take Stairs?: Yes Driving: No Vocation: Unemployed 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[employer]; also pastor Comments: friend would bring him meals or order out Vision/Perception   See OT evaluation  Cognition Overall Cognitive Status: History of cognitive impairments - at baseline Arousal/Alertness: Awake/alert Orientation Level: Oriented to person;Oriented to place;Oriented to situation Attention: Sustained Focused Attention: Appears intact Sustained Attention: Impaired Sustained Attention Impairment: Verbal basic Selective Attention: Impaired Selective Attention Impairment: Functional basic Memory: Impaired Memory Impairment: Decreased recall of new information;Decreased short term memory Decreased Long Term Memory: Functional basic Decreased Short Term Memory: Verbal basic Awareness: Impaired Awareness Impairment: Intellectual impairment Problem Solving: Impaired Problem Solving Impairment: Functional basic;Verbal basic Behaviors: Lability Safety/Judgment: Impaired Comments: pt is a poor historian and with poor awareness of acute versus baseline deficits  Sensation Sensation Light Touch: Impaired Detail Light Touch Impaired Details: Impaired RLE;Impaired RUE Stereognosis: Not tested Hot/Cold: Not tested Proprioception: Impaired Detail Proprioception Impaired Details: Impaired RLE Coordination Gross Motor Movements are Fluid and Coordinated: No Fine Motor Movements are Fluid and Coordinated: No Coordination and Movement Description: chronic spastic R hemiplegia from past CVA Motor  Motor Motor: Hemiplegia;Abnormal tone;Abnormal postural alignment and control Motor - Skilled Clinical Observations: R UE/LE hemiplegia, R wrist clonus, R LE appears ataxic with ambulation  Mobility Bed Mobility Bed Mobility: Supine to Sit;Sit to Supine Supine to Sit: 4: Min guard;HOB flat Sit to Supine: 4: Min guard;HOB flat Transfers Transfers: Yes Stand  Pivot Transfers: 3: Mod assist;With armrests (with RW) Stand Pivot Transfer Details: Verbal cues for technique;Verbal cues for sequencing;Manual facilitation for weight shifting Stand Pivot Transfer Details (indicate cue type and reason): assist for management of RW Squat Pivot Transfers: 3: Mod assist;With upper extremity assistance;With armrests Squat Pivot Transfer Details: Verbal cues for technique;Verbal cues for sequencing;Manual facilitation for weight shifting Locomotion  Ambulation Ambulation: Yes Ambulation/Gait Assistance: 3: Mod assist Ambulation Distance (Feet): 90 Feet Assistive device: Rolling walker;1 person hand held assist Ambulation/Gait Assistance Details: Verbal cues for technique;Verbal cues for gait pattern;Tactile cues for weight shifting;Verbal cues for precautions/safety;Verbal cues for safe use of DME/AE Ambulation/Gait Assistance Details: mod A for controlling RW and mod A for balance without AD Gait Gait: Yes Gait Pattern: Impaired Gait Pattern: Ataxic;Decreased stance time - right;Decreased stride length;Decreased weight shift to right;Lateral trunk lean to left;Decreased trunk rotation Gait velocity: decreased Stairs /  Additional Locomotion Stairs: Yes Stairs Assistance: 3: Mod assist Stair Management Technique: One rail Left;Step to pattern;Forwards (and R HHA) Number of Stairs: 5 Height of Stairs: 6 Wheelchair Mobility Wheelchair Mobility: Yes Wheelchair Assistance: 3: Building surveyor Details: Verbal cues for Marketing executive: Left upper extremity (unable to reach ground for LLE) Wheelchair Parts Management: Needs assistance Distance: 100  Trunk/Postural Assessment  Cervical Assessment Cervical Assessment: Within Functional Limits Thoracic Assessment Thoracic Assessment: Within Functional Limits Lumbar Assessment Lumbar Assessment: Within Functional Limits Postural Control Postural Control: Deficits on  evaluation Protective Responses: impaired/delayed Postural Limitations: Pt with right shoulder/scapular elevation, slight right trunk elongation  Balance Balance Balance Assessed: Yes Dynamic Sitting Balance Dynamic Sitting - Balance Support: No upper extremity supported Dynamic Sitting - Level of Assistance: 5: Stand by assistance Static Standing Balance Static Standing - Balance Support: No upper extremity supported Static Standing - Level of Assistance: 4: Min assist Dynamic Standing Balance Dynamic Standing - Balance Support: Right upper extremity supported Dynamic Standing - Level of Assistance: 4: Min assist Extremity Assessment  RUE Assessment RUE Assessment: Exceptions to [facility] RUE Strength RUE Overall Strength Comments: Pt with history of spastic right hemiparesis in the UE.  AAROM shoulder flexion 0-90 degrees.  Synergy pattern in Brunnstrum stage III level with the hand also at stage III.  He dmonstrates increased flexor tone in the right elbow and digits with pt demonstrating limiting PROM digit extension at DIP joints.  He is able to exhibit gross AROM digit flexion but not active extension. LUE Assessment LUE Assessment: Within Functional Limits (AROM and strength WFLs overall slight dysmetria with finger to nose testing but did not affect functional performance with ADLs. ) RLE Assessment RLE Assessment: Exceptions to [facility] RLE Strength RLE Overall Strength: Deficits;Due to premorbid status RLE Overall Strength Comments: 3-/5 for hip flexion, knee flexion, and ankle DF; 4+/5 knee extension and ankle PF LLE Assessment LLE Assessment: Within Functional Limits  FIM:  FIM - Control and instrumentation engineer Devices: Arm rests Bed/Chair Transfer: 3: Bed > Chair or W/C: Mod A (lift or lower assist);3: Chair or W/C > Bed: Mod A (lift or lower assist) FIM - Locomotion: Wheelchair Distance: 100 Locomotion: Wheelchair: 2: Travels 50 - 149 ft with moderate assistance  (Pt: 50 - 74%) FIM - Locomotion: Ambulation Locomotion: Ambulation Assistive Devices: Other (comment);Walker - Rolling (no AD) Ambulation/Gait Assistance: 3: Mod assist Locomotion: Ambulation: 2: Travels 50 - 149 ft with moderate assistance (Pt: 50 - 74%) FIM - Locomotion: Stairs Locomotion: Stairs Assistive Devices: Insurance account manager - 1;Other (comment) (and HHA) Locomotion: Stairs: 2: Up and Down 4 - 11 stairs with moderate assistance (Pt: 50 - 74%)   Refer to Care Plan for Long Term Goals  Recommendations for other services: None  Discharge Criteria: Patient will be discharged from PT if patient refuses treatment 3 consecutive times without medical reason, if treatment goals not met, if there is a change in medical status, if patient makes no progress towards goals or if patient is discharged from hospital.  The above assessment, treatment plan, treatment alternatives and goals were discussed and mutually agreed upon: by patient  Laretta Alstrom 05/05/2014, 12:51 PM

## 2014-05-05 NOTE — Progress Notes (Signed)
[facility] Admission Coordinator Signed Physical Medicine and Rehabilitation PMR Pre-admission Service date: 05/04/2014 9:27 AM  Related encounter: Admission (Discharged) from 04/29/2014 in [place]   PMR Admission Coordinator Pre-Admission Assessment  Patient: Adam Callahan is an 53 y.o., male  MRN: [id number]  DOB: 1961-05-12  Height: 5' 6"  (167.6 cm)  Weight: 76.839 kg (169 lb 6.4 oz)  Insurance Information  HMO: PPO: PCP: IPA: 80/20: OTHER:  PRIMARY: [employer] Policy#: [id number] Subscriber: self  CM Name: Leeann Must Phone#: [phone] Fax#: [phone]  Authorization for 2 weeks given on 05-04-14 through 05-18-14 with updates due on 05-18-14 or upon DC to [employer] at above fax  Pre-Cert#: [id number] Employer: [employer]  Benefits: Phone #: [phone] Name: per Blue-E  Eff. Date: 07-22-13 Deduct: $700 (met all) Out of Pocket Max: $3910 (met $2306.90)  Life Max: none  CIR: 80/20% and $233 copay per admit, pre-auth needed SNF: 80/20%, visit limit 100 days, pre-auth needed  Outpatient: 80% Co-Pay: 20% and $52 copay, no visit limits  Home Health: 80% Co-Pay: 20%, no visit limits  DME: 80% Co-Pay: 20%  Providers: in network  Emergency Contact Information    Contact Information     Name  Relation  Home  Work  Mobile     [place]  Daughter    [phone]        Current Medical History  Patient Admitting Diagnosis: New right PCA infarct, prior left PCA infarct  History of Present Illness: Adam Callahan is a 53 y.o. 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 Patient lives alone and used walker prior to admission. Admitted 04/30/2014 with blurred vision and headache. 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 MRA of the brain with chronic occlusion of left PCA. Patient did not receive TPA. Echocardiogram with ejection fraction 54% grade 1 diastolic dysfunction. Carotid Dopplers with no ICA stenosis. Neurology consulted maintained on aspirin and Plavix for CVA prophylaxis. Subcutaneous heparin for DVT prophylaxis. Hemoglobin A1c 11.3 with insulin therapy as directed. Physical therapy evaluation completed 05/01/2014 with recommendations for physical medicine rehabilitation consult.  NIH Total: 9  Past Medical History    Past Medical History    Diagnosis  Date    .  Diabetes mellitus     .  Hypertension      Family History  family history includes Cervical cancer in his mother; Diabetes Mellitus II in his maternal uncle; Hypertension in his sister.  Prior Rehab/Hospitalizations: none  Current Medications  Current facility-administered medications:aspirin EC tablet 81 mg, 81 mg, Oral, Daily, Amie Portland, MD, 81 mg at 05/04/14 0951; atorvastatin (LIPITOR) tablet 20 mg, 20 mg, Oral, q1800, Ivor Costa, MD, 20 mg at 05/03/14 1755; clopidogrel (PLAVIX) tablet 75 mg, 75 mg, Oral, Q breakfast, Ivor Costa, MD, 75 mg at 05/04/14 0086; heparin injection 5,000 Units, 5,000 Units, Subcutaneous, 3 times per day, Ivor Costa, MD, 5,000 Units at 05/03/14 2142  insulin aspart (novoLOG) injection 0-9 Units, 0-9 Units, Subcutaneous, TID WC, Reyne Dumas, MD, 1 Units at 05/04/14 1154; insulin aspart (novoLOG) injection 4 Units, 4 Units, Subcutaneous, TID WC, Reyne Dumas, MD, 4 Units at 05/04/14 1155; insulin glargine (LANTUS) injection 20 Units, 20 Units, Subcutaneous, Daily, Reyne Dumas, MD, 20 Units at 05/04/14 0950; senna-docusate (  Senokot-S) tablet 1 tablet, 1 tablet, Oral, QHS PRN, Ivor Costa, MD  traMADol Veatrice Bourbon) tablet 50 mg, 50 mg, Oral, Q6H PRN, Ivor Costa, MD  Patients Current Diet: heart healthy/carb modified  Precautions / Restrictions  Precautions   Precautions: Fall  Restrictions  Weight Bearing Restrictions: No  Prior Activity Level  Community (5-7x/wk): pt was currently working as a Sports coach for [employer], and was living independently alone. Pt is very determined to get back to his own home and "keep my house" after he is recovered.  Home Assistive Devices / Equipment  Home Assistive Devices/Equipment: None  Home Equipment: Cane - quad;Wheelchair - manual;Shower seat  Prior Functional Level  Prior Function  Level of Independence: Independent with assistive device(s)  Current Functional Level    Cognition  Overall Cognitive Status: Difficult to assess  Orientation Level: Oriented to person;Oriented to place;Oriented to time;Disoriented to situation  Attention: Selective  Selective Attention: Impaired  Selective Attention Impairment: Verbal basic  Memory: Impaired  Memory Impairment: Prospective memory;Storage deficit  Awareness: Impaired  Awareness Impairment: Anticipatory impairment  Problem Solving: Impaired  Problem Solving Impairment: Verbal complex  Behaviors: Lability  Safety/Judgment: Impaired    Extremity Assessment  (includes Sensation/Coordination)      ADLs  Overall ADL's : Needs assistance/impaired  Eating/Feeding: Set up;Sitting  Grooming: Oral care;Wash/dry face;Applying deodorant;Minimal assistance;Standing  Upper Body Bathing: Standing;Moderate assistance  Lower Body Bathing: Min guard;Sit to/from stand  Upper Body Dressing : Sitting;Standing;Minimal assistance  Lower Body Dressing: Sit to/from stand;Minimal assistance  Toilet Transfer: +2 for physical assistance;Moderate assistance;Ambulation (bed/chair)  Toileting- Clothing Manipulation and Hygiene: Sit to/from stand;Minimal assistance  Functional mobility during ADLs: +2 for physical assistance;Moderate assistance (hand held assist)  General ADL Comments: Pt performed some ADLs at sink. +2 assist needed for ambulation. Spoke about  safety. Encouraged pt to try to use RUE to support items if able. Stood at sink with Min guard assist for balance. Discussed UB dressing technique.    Mobility  Overal bed mobility: Needs Assistance  Bed Mobility: Supine to Sit  Supine to sit: Min guard  Sit to supine: Supervision  General bed mobility comments: up clumsily, but without assist    Transfers  Overall transfer level: Needs assistance  Equipment used: 1 person hand held assist  Transfers: Sit to/from Stand  Sit to Stand: Min assist  Stand pivot transfers: Min assist  General transfer comment: assist to keep patient coming forward over BOS    Ambulation / Gait / Stairs / Wheelchair Mobility  Ambulation/Gait  Ambulation/Gait assistance: Min assist;+2 safety/equipment  Ambulation Distance (Feet): 175 Feet  Assistive device: 2 person hand held assist  Gait Pattern/deviations: Step-through pattern;Decreased step length - left;Decreased stance time - right;Ataxic;Narrow base of support  Gait velocity: decreased  General Gait Details: short step length, midly ataxic on the R > L;L hip drop during L LE swing through difficult to control. Needed w/shift assist.    Posture / Balance  Dynamic Sitting Balance  Sitting balance - Comments: Worked on w/shift and sitting balance/stability with reaching and leg Kick activities    Special needs/care consideration  BiPAP/CPAP no  CPM no  Continuous Drip IV no  Dialysis no  Life Vest no  Oxygen  Special Bed no  Trach Size no  Wound Vac (area) no  Skin - no issues  Bowel mgmt: - no reported BM documented since admit. Pt thinks his las BM was over the weekend PTA.  Bladder mgmt: using urinal currently  Diabetic mgmt -  managed at home with insulin  Note: pt did become labile at times during my interactions with him.    Previous Home Environment  Living Arrangements: Alone  Lives With: Alone  Available Help at Discharge: Family;Friend(s);Available 24 hours/day  Type of Home: House  Home  Layout: One level  Home Access: Other (comment) (threshold to porch and inside house)  Bathroom Shower/Tub: Tub/shower unit;Curtain  Bathroom Toilet: Standard  Bathroom Accessibility: Yes  How Accessible: Accessible via wheelchair;Accessible via walker  Home Care Services: Yes  Discharge Living Setting  Plans for Discharge Living Setting: House;Other (Comment) (Plan is to go to dtr's house in [place], [place])  Type of Home at Discharge: House  Discharge Home Layout: One level  Discharge Home Access: Level entry  Does the patient have any problems obtaining your medications?: No  Social/Family/Support Systems  Patient Roles: Other (Comment) (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  Anticipated Caregiver's Contact Information: see above  Ability/Limitations of Caregiver: dtr works but ex-wife can provide care during the day. Pt has two ex-wives. Pt's first ex-wife is Nasario Czerniak (mother of CeJie) and Enid Derry is willing to help provide assistance during the day for pt while dtr is at work. Pt was hesitant at first about this plan but was willing. "I love my dtr and I need the help. I haven't talked to my ex-wife in a long time."  Caregiver Availability: Other (Comment) (24-7 between dtr and ex-wife)  Discharge Plan Discussed with Primary Caregiver: (Discussed by phone with dtr on 10-13 and 10-4. )  Is Caregiver In Agreement with Plan?: Yes  Does Caregiver/Family have Issues with Lodging/Transportation while Pt is in Rehab?: No  Note: pt's ex-wife Terrin Meddaugh does not have a phone and can be reached through dtr. CeJie's contact information. Enid Derry also lives in [place].  Dtr shared that she is having some car trouble and has not been able to visit her dad yet. She hopes to have the car issues taken care of soon.  Goals/Additional Needs  Patient/Family Goal for  Rehab: Supervision with PT, Supervision and Min Assist with OT, Supervision with SLP  Expected length of stay: 10-14 days  Cultural Considerations: none  Dietary Needs: heart healthy, carb modified, thin liquids  Equipment Needs: to be determined  Pt/Family Agrees to Admission and willing to participate: (spoke with dtr by phone on 10-13 and 10-14)  Program Orientation Provided & Reviewed with Pt/Caregiver Including Roles & Responsibilities: Yes  Decrease burden of Care through IP rehab admission: NA  Possible need for SNF placement upon discharge: not anticipated  Patient Condition: This patient's medical and functional status has changed since the consult dated: 05-02-14 in which the Rehabilitation Physician determined and documented that the patient's condition is appropriate for intensive rehabilitative care in an inpatient rehabilitation facility. See "History of Present Illness" (above) for medical update. Functional changes are: minimal assistance of 2 to ambulate 175 ' with ataxic gait and minimal to moderate assistance with self care tasks. Patient's medical and functional status update has been discussed with the Rehabilitation physician and patient remains appropriate for inpatient rehabilitation. Will admit to inpatient rehab today.  Preadmission Screen Completed By: Nanetta Batty, PT, 05/04/2014 12:58 PM  ______________________________________________________________________  Discussed status with Dr. Letta Pate on 05/04/14 at 1251 and received telephone approval for admission today.  Admission Coordinator: Nanetta Batty, PT, time 1251/Date 05/04/14    Cosigned by:  Charlett Blake, MD [05/04/2014 1:13 PM]

## 2014-05-05 NOTE — Progress Notes (Signed)
Patient information reviewed and entered into eRehab system by Pistol Kessenich, RN, CRRN, PPS Coordinator.  Information including medical coding and functional independence measure will be reviewed and updated through discharge.    

## 2014-05-05 NOTE — Evaluation (Signed)
Speech Language Pathology Assessment and Plan  Patient Details  Name: Adam Callahan MRN: [id number] Date of Birth: 15-Nov-1960  SLP Diagnosis: Cognitive Impairments;Speech and Language deficits  Rehab Potential: Fair ELOS: 10 days     Today's Date: 05/05/2014 SLP Individual Time: 1330-1430 SLP Individual Time Calculation (min): 60 min   Problem List:  Patient Active Problem List   Diagnosis Date Noted  . Acute left hemiparesis 05/04/2014  . Stroke 04/30/2014  . Spastic hemiplegia affecting dominant side 10/01/2013  . Aphasia as late effect of cerebrovascular accident 10/01/2013  . 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 Dehydration 06/07/2013  . Hypokalemia 06/07/2013  . Dyslipidemia 06/07/2013  . CVA (cerebral infarction) 06/06/2013  . Diabetes mellitus type 2, uncontrolled 06/06/2013  . Rhabdomyolysis 06/06/2013   Past Medical History:  Past Medical History  Diagnosis Date  . Diabetes mellitus   . Hypertension    Past Surgical History:  Past Surgical History  Procedure Laterality Date  . Tee without cardioversion N/A 05/03/2014    Procedure: TRANSESOPHAGEAL ECHOCARDIOGRAM (TEE);  Surgeon: Pixie Casino, MD;  Location: [facility] ENDOSCOPY;  Service: Cardiovascular;  Laterality: N/A;    Assessment / Plan / Recommendation Clinical Impression   Adam Callahan is a 53 y.o. 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 Patient noncompliant with medications at home due to financial issues. Patient lives alone and used walker prior to admission. Admitted 04/30/2014 with blurred vision and headache. 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  Physical and occupational therapy evaluations completed 05/01/2014 with recommendations for physical medicine rehabilitation consult. Patient was admitted for comprehensive rehabilitation program 05/04/2014.  SLP evaluation completed 05/05/2014 with the following results: Pt presents with moderate cognitive impairments characterized by decreased sustained attention, functional problem solving, error awareness, and short term memory for delayed recall of basic information.  Pt also presented with mild word finding impairments in conversation and mild dysarthria due to right oral facial weakness. Pt is a poor historian with inconsistent reports of baseline cognitive functioning.  Suspect pt had baseline impairments from previous stroke; however, pt was living independently.   Pt would benefit from skilled ST while inpatient in order to maximize functional independence and reduce burden of care upon discharge.  Anticipate that pt will need at least intermittent supervision upon discharge as well as assistance for medication and financial management.    Skilled Therapeutic Interventions          Cognitive-linguistic evaluation completed with results and recommendations reviewed with patient.     SLP Assessment  Patient will need skilled [facility] during CIR admission    Recommendations  Patient destination:  (TBD) Follow up Recommendations: Other (comment) (TBD pending progress while inpatient )    SLP Frequency 5 out of 7 days   SLP Treatment/Interventions Cognitive remediation/compensation;Cueing hierarchy;Internal/external aids;Patient/family education;Environmental controls    Pain Pain Assessment Pain Assessment: No/denies pain Prior Functioning Cognitive/Linguistic Baseline: Baseline deficits Baseline deficit details: Previous left PCA stroke with residual dysarthria and word finding deficits Type of Home: House  Lives With: Alone Available Help at Discharge:  Family;Friend(s);Available PRN/intermittently Education: 12 grade, but states he doesn't read well. Vocation: On disability  Short Term Goals: Week 1: SLP Short Term Goal 1 (Week 1): Pt  will improve functional problem solving during basic self care tasks for 75% accuracy with min assist.  SLP Short Term Goal 2 (Week 1): Pt will recall at least 3 compensatory strategies for memory to facilitate improved carryover of daily information.  SLP Short Term Goal 3 (Week 1): Pt will improve topic maintenance during functional conversations with no more than 3 cues needed for redirection.  SLP Short Term Goal 4 (Week 1): Pt will improve sustained attention during functional tasks for 5-7 minutes with min assist.    See FIM for current functional status Refer to Care Plan for Long Term Goals  Recommendations for other services: None  Discharge Criteria: Patient will be discharged from SLP if patient refuses treatment 3 consecutive times without medical reason, if treatment goals not met, if there is a change in medical status, if patient makes no progress towards goals or if patient is discharged from hospital.  The above assessment, treatment plan, treatment alternatives and goals were discussed and mutually agreed upon: by patient   Adam Callahan, M.A. CCC-SLP  Adam Callahan, Selinda Orion 05/05/2014, 4:54 PM

## 2014-05-05 NOTE — Evaluation (Signed)
Occupational Therapy Assessment and Plan  Patient Details  Name: Adam Callahan MRN: [id number] Date of Birth: [birth year]  OT Diagnosis: abnormal posture, cognitive deficits, hemiplegia affecting dominant side and muscle weakness (generalized) Rehab Potential: Rehab Potential: Good ELOS: 10-12 days   Today's Date: 05/05/2014 OT Individual Time: 0034-9179 OT Individual Time Calculation (min): 61 min     Problem List:  Patient Active Problem List   Diagnosis Date Noted  . Acute left hemiparesis 05/04/2014  . Stroke 04/30/2014  . Spastic hemiplegia affecting dominant side 10/01/2013  . Aphasia as late effect of cerebrovascular accident 10/01/2013  . 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 Dehydration 06/07/2013  . Hypokalemia 06/07/2013  . Dyslipidemia 06/07/2013  . CVA (cerebral infarction) 06/06/2013  . Diabetes mellitus type 2, uncontrolled 06/06/2013  . Rhabdomyolysis 06/06/2013    Past Medical History:  Past Medical History  Diagnosis Date  . Diabetes mellitus   . Hypertension    Past Surgical History:  Past Surgical History  Procedure Laterality Date  . Tee without cardioversion N/A 05/03/2014    Procedure: TRANSESOPHAGEAL ECHOCARDIOGRAM (TEE);  Surgeon: Pixie Casino, MD;  Location: [facility] ENDOSCOPY;  Service: Cardiovascular;  Laterality: N/A;    Assessment & Plan Clinical Impression: Patient is a 53 y.o. year old male with recent admission to the hospital on 04/30/2014 with blurred vision and headache. 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 MRA of the brain with chronic occlusion of left PCA. Patient did not receive TPA.  Patient transferred to CIR on 05/04/2014 .    Patient currently requires min with basic self-care skills secondary to muscle weakness and muscle joint tightness, impaired timing and  sequencing, abnormal tone, ataxia and decreased coordination, field cut, decreased attention to right, decreased awareness, decreased safety awareness and decreased memory and decreased standing balance, decreased postural control and hemiplegia.  Prior to hospitalization, patient could complete ADLs with modified independent .  Patient will benefit from skilled intervention to decrease level of assist with basic self-care skills and increase independence with basic self-care skills prior to discharge home with care partner.  Anticipate patient will require 24 hour supervision and follow up home health.  OT - End of Session Activity Tolerance: Tolerates 30+ min activity with multiple rests Endurance Deficit: Yes OT Assessment Rehab Potential: Good Barriers to Discharge: Decreased caregiver support Barriers to Discharge Comments: pt lives alone OT Patient demonstrates impairments in the following area(s): Balance;Cognition;Endurance;Motor;Safety;Perception;Sensory;Vision OT Basic ADL's Functional Problem(s): Eating;Grooming;Bathing;Dressing;Toileting OT Transfers Functional Problem(s): Toilet;Tub/Shower OT Additional Impairment(s): Fuctional Use of Upper Extremity OT Plan OT Intensity: Minimum of 1-2 x/day, 45 to 90 minutes OT Frequency: 5 out of 7 days OT Duration/Estimated Length of Stay: 10-12 days OT Treatment/Interventions: Balance/vestibular training;Cognitive remediation/compensation;DME/adaptive equipment instruction;Disease mangement/prevention;Community reintegration;Functional electrical stimulation;Functional mobility training;Discharge planning;Psychosocial support;Therapeutic Activities;Visual/perceptual remediation/compensation;UE/LE Coordination activities;Patient/family education;Splinting/orthotics;UE/LE Strength taining/ROM;Therapeutic Exercise;Self Care/advanced ADL retraining;Neuromuscular re-education OT Self Feeding Anticipated Outcome(s): modified independent level OT  Basic Self-Care Anticipated Outcome(s): supervision OT Toileting Anticipated Outcome(s): supervision OT Bathroom Transfers Anticipated Outcome(s): supervision OT Recommendation Patient destination: Home Follow Up Recommendations: Home health OT Equipment Recommended: To be determined   OT Evaluation Precautions/Restrictions  Precautions Precautions: Fall Precaution Comments: R hemiplegia from prior CVA, confusion Restrictions Weight Bearing Restrictions: No  Pain Pain Assessment Pain Assessment: No/denies pain Home Living/Prior Functioning Home Living Living Arrangements: Alone Available Help at Discharge: Family;Friend(s);Available PRN/intermittently Type of Home: House Home Access:  Other (comment) Home Layout: One level Additional Comments: Patient reports he can stay with daughter who lives in [place] for a short period of time  Lives With: Alone IADL History Homemaking Responsibilities: No Current License: Yes (Per pt report but feel this may be inaccurate.) Education: 12 grade, but states he doesn't read well. Prior Function Level of Independence: Independent with gait;Independent with basic ADLs  Able to Take Stairs?: Yes Driving: No Vocation: On disability Vocation Requirements: worked as Retail buyer at [employer]; also pastor Comments: friend would bring him meals or order out ADL  See FIM scale for details  Vision/Perception  Vision- History Baseline Vision/History: No visual deficits Patient Visual Report: No change from baseline Vision- Assessment Vision Assessment?: Yes Eye Alignment: Within Functional Limits Ocular Range of Motion: Within Functional Limits Alignment/Gaze Preference: Within Defined Limits Tracking/Visual Pursuits: Able to track stimulus in all quads without difficulty Visual Fields: Right homonymous hemianopsia;Right visual field deficit  Cognition Overall Cognitive Status: History of cognitive impairments - at  baseline Arousal/Alertness: Awake/alert Orientation Level: Oriented to person;Oriented to place;Oriented to situation Attention: Sustained Focused Attention: Appears intact Sustained Attention: Impaired Sustained Attention Impairment: Verbal basic Selective Attention: Impaired Selective Attention Impairment: Functional basic Memory: Impaired Memory Impairment: Decreased recall of new information;Decreased short term memory Decreased Long Term Memory: Functional basic Decreased Short Term Memory: Verbal basic Awareness: Impaired Awareness Impairment: Intellectual impairment Problem Solving: Impaired Problem Solving Impairment: Functional basic;Verbal basic Behaviors: Lability Safety/Judgment: Impaired Comments: pt is a poor historian and with poor awareness of acute versus baseline deficits  Sensation Sensation Light Touch: Impaired Detail Light Touch Impaired Details: Impaired RUE;Impaired LUE Stereognosis: Impaired Detail Stereognosis Impaired Details: Impaired RUE Hot/Cold: Not tested Proprioception: Impaired Detail Proprioception Impaired Details: Impaired RUE Additional Comments: Pt unable to detect light touch or proprioception, or deep pressure in the right forearm or hand.  Coordination Gross Motor Movements are Fluid and Coordinated: No Fine Motor Movements are Fluid and Coordinated: No Coordination and Movement Description: Pt with spastic hemiparesis in the RUE and RLE from previous CVA.  Pt able to use the RUE as a stabilizer during bathing only.  Increased flexor tone noted in the digits and elbow as well with only 50% gross digit flexion and no acitve extension present.  Motor  Motor Motor: Hemiplegia;Abnormal tone;Abnormal postural alignment and control Motor - Skilled Clinical Observations: R UE/LE hemiplegia, R wrist clonus, R LE appears ataxic with ambulation Mobility  Bed Mobility Bed Mobility: Supine to Sit;Sit to Supine Supine to Sit: 4: Min guard;HOB  flat Transfers Transfers: Sit to Stand;Stand to Sit Sit to Stand: 4: Min assist;From chair/3-in-1 Sit to Stand Details: Verbal cues for technique;Manual facilitation for placement;Verbal cues for safe use of DME/AE Stand to Sit: 4: Min assist Stand to Sit Details (indicate cue type and reason): Manual facilitation for placement;Verbal cues for technique;Verbal cues for safe use of DME/AE  Trunk/Postural Assessment  Cervical Assessment Cervical Assessment: Within Functional Limits Thoracic Assessment Thoracic Assessment: Within Functional Limits Lumbar Assessment Lumbar Assessment: Within Functional Limits Postural Control Postural Control: Deficits on evaluation Postural Limitations: Pt with right shoulder/scapular elevation and slight right trunk elongation as well.   Balance Balance Balance Assessed: Yes Dynamic Sitting Balance Dynamic Sitting - Balance Support: No upper extremity supported Dynamic Sitting - Level of Assistance: 5: Stand by assistance Static Standing Balance Static Standing - Balance Support: No upper extremity supported Static Standing - Level of Assistance: 4: Min assist Dynamic Standing Balance Dynamic Standing - Balance Support: Right upper extremity  supported Dynamic Standing - Level of Assistance: 4: Min assist Extremity/Trunk Assessment RUE Assessment RUE Assessment: Exceptions to [facility] RUE Strength RUE Overall Strength Comments: Pt with history of spastic right hemiparesis in the UE.  AAROM shoulder flexion 0-90 degrees.  Synergy pattern in Brunnstrum stage III level with the hand also at stage III.  He dmonstrates increased flexor tone in the right elbow and digits with pt demonstrating limiting PROM digit extension at DIP joints.  He is able to exhibit gross AROM digit flexion but not active extension. LUE Assessment LUE Assessment: Within Functional Limits (AROM and strength WFLs overall slight dysmetria with finger to nose testing but did not affect  functional performance with ADLs. )  FIM:  FIM - Eating Eating Activity: 5: Set-up assist for open containers FIM - Grooming Grooming Steps: Wash, rinse, dry face;Wash, rinse, dry hands Grooming: 5: Supervision: safety issues or verbal cues FIM - Bathing Bathing Steps Patient Completed: Chest;Right Arm;Abdomen;Front perineal area;Buttocks;Right upper leg;Left upper leg;Right lower leg (including foot);Left lower leg (including foot) Bathing: 4: Min-Patient completes 8-9 [id number] parts or 75+ percent FIM - Upper Body Dressing/Undressing Upper body dressing/undressing steps patient completed: Thread/unthread right sleeve of pullover shirt/dresss;Put head through opening of pull over shirt/dress Upper body dressing/undressing: 3: Mod-Patient completed 50-74% of tasks FIM - Lower Body Dressing/Undressing Lower body dressing/undressing steps patient completed: Thread/unthread right underwear leg;Thread/unthread left underwear leg;Pull underwear up/down;Thread/unthread right pants leg;Thread/unthread left pants leg;Pull pants up/down;Don/Doff right sock;Don/Doff left sock Lower body dressing/undressing: 4: Min-Patient completed 75 plus % of tasks FIM - Toileting Toileting: 0: Activity did not occur FIM - TSystems developerDevices: Tub transfer bench Tub/shower Transfers: 3-Into Tub/Shower: Mod A (lift or lower/lift 2 legs);3-Out of Tub/Shower: Mod A (lift or lower/lift 2 legs)   Refer to Care Plan for Long Term Goals  Recommendations for other services: None  Discharge Criteria: Patient will be discharged from OT if patient refuses treatment 3 consecutive times without medical reason, if treatment goals not met, if there is a change in medical status, if patient makes no progress towards goals or if patient is discharged from hospital.  The above assessment, treatment plan, treatment alternatives and goals were discussed and mutually agreed upon: by patient  Pt performed  selfcare re-training during session with emphasis on functional transfers, sequencing, safety, RUE functional use during B/D task.  Pt with increased confusion when asked about his PLOF.  Pt giving information on functional level prior to older CVA.  Was stating that he didn't understand why he was having so much difficulty with his RUE and RLE even though this current CVA did not affect the right side.  Min assist overall for most tasks but did need mod assist for UB dressing as pt could not sequence donning his pullover shirt.    Adam Callahan OTR/L 05/05/2014, 4:45 PM

## 2014-05-05 NOTE — Care Management Note (Signed)
[facility] Individual Statement of Services  Patient Name:  Adam Callahan  Date:  05/05/2014  Welcome to the [facility].  Our goal is to provide you with an individualized program based on your diagnosis and situation, designed to meet your specific needs.  With this comprehensive rehabilitation program, you will be expected to participate in at least 3 hours of rehabilitation therapies Monday-Friday, with modified therapy programming on the weekends.  Your rehabilitation program will include the following services:  Physical Therapy (PT), Occupational Therapy (OT), Speech Therapy (ST), 24 hour per day rehabilitation nursing, Neuropsychology, Case Management (Social Worker), Rehabilitation Medicine, Nutrition Services and Pharmacy Services  Weekly team conferences will be held on Wednesday to discuss your progress.  Your Social Worker will talk with you frequently to get your input and to update you on team discussions.  Team conferences with you and your family in attendance may also be held.  Expected length of stay: 7-10 days Overall anticipated outcome: supervision with cueing  Depending on your progress and recovery, your program may change. Your Social Worker will coordinate services and will keep you informed of any changes. Your Social Worker's name and contact numbers are listed  below.  The following services may also be recommended but are not provided by the [facility] will be made to provide these services after discharge if needed.  Arrangements include referral to agencies that provide these services.  Your insurance has been verified to be:  [employer] Your primary doctor is:  None  Pertinent information will be shared with your doctor and your insurance  company.  Social Worker:  Ovidio Kin, Helper or [phone]  Information discussed with and copy given to patient by: Elease Hashimoto, 05/05/2014, 1:29 PM

## 2014-05-05 NOTE — Progress Notes (Signed)
Social Work [name], male   DOB: 09/15/1960, 53 y.o.   MRN: [id number] Pt is aware of this plan and is agreeable, also wants this worker to contact [employer] to check on the status of his disability claim. Will check to see if one has been done and if this hospitalization can be added to the information.

## 2014-05-06 ENCOUNTER — Inpatient Hospital Stay (HOSPITAL_COMMUNITY): Payer: Self-pay | Admitting: Speech Pathology

## 2014-05-06 ENCOUNTER — Inpatient Hospital Stay (HOSPITAL_COMMUNITY): Payer: BC Managed Care – PPO | Admitting: Physical Therapy

## 2014-05-06 ENCOUNTER — Inpatient Hospital Stay (HOSPITAL_COMMUNITY): Payer: BC Managed Care – PPO | Admitting: Occupational Therapy

## 2014-05-06 LAB — CARDIOLIPIN ANTIBODIES, IGG, IGM, IGA
Anticardiolipin IgA: 10 APL U/mL — ABNORMAL LOW (ref <22)
Anticardiolipin IgG: 7 GPL U/mL — ABNORMAL LOW (ref <23)
Anticardiolipin IgM: 1 MPL U/mL — ABNORMAL LOW (ref <11)

## 2014-05-06 LAB — LUPUS ANTICOAGULANT PANEL
DRVVT: 33 secs (ref <42.9)
LUPUS ANTICOAGULANT: NOT DETECTED
PTT Lupus Anticoagulant: 35 secs (ref 28.0–43.0)

## 2014-05-06 LAB — HOMOCYSTEINE: HOMOCYSTEINE-NORM: 8.4 umol/L (ref 4.0–15.4)

## 2014-05-06 LAB — HIV ANTIBODY (ROUTINE TESTING W REFLEX): HIV 1&2 Ab, 4th Generation: NONREACTIVE

## 2014-05-06 LAB — SJOGRENS SYNDROME-B EXTRACTABLE NUCLEAR ANTIBODY: SSB (La) (ENA) Antibody, IgG: <1

## 2014-05-06 LAB — RPR

## 2014-05-06 LAB — C3 COMPLEMENT: C3 Complement: 126 mg/dL (ref 90–180)

## 2014-05-06 LAB — GLUCOSE, CAPILLARY
GLUCOSE-CAPILLARY: 113 mg/dL — AB (ref 70–99)
GLUCOSE-CAPILLARY: 151 mg/dL — AB (ref 70–99)
GLUCOSE-CAPILLARY: 93 mg/dL (ref 70–99)
Glucose-Capillary: 106 mg/dL — ABNORMAL HIGH (ref 70–99)

## 2014-05-06 LAB — PROTHROMBIN GENE MUTATION

## 2014-05-06 LAB — COMPLEMENT, TOTAL: Compl, Total (CH50): >60 U/mL — ABNORMAL HIGH (ref 31–60)

## 2014-05-06 LAB — ANCA SCREEN W REFLEX TITER
ATYPICAL P-ANCA SCREEN: NEGATIVE
c-ANCA Screen: NEGATIVE
p-ANCA Screen: NEGATIVE

## 2014-05-06 LAB — ANA: ANA: NEGATIVE

## 2014-05-06 LAB — C4 COMPLEMENT: Complement C4, Body Fluid: 25 mg/dL (ref 10–40)

## 2014-05-06 LAB — SJOGRENS SYNDROME-A EXTRACTABLE NUCLEAR ANTIBODY: SSA (RO) (ENA) ANTIBODY, IGG: NEGATIVE

## 2014-05-06 NOTE — Progress Notes (Signed)
Subjective/Complaints: No issues overnite Discussed neurologic impairments with PT We discussed this is unlikely to improve since CVA was in 06/2013  Objective: Vital Signs: Blood pressure 130/81, pulse 92, temperature 97.4 F (36.3 C), temperature source Oral, resp. rate 18, weight 75.932 kg (167 lb 6.4 oz), SpO2 100.00%. No results found. Results for orders placed during the hospital encounter of 05/04/14 (from the past 72 hour(s))  GLUCOSE, CAPILLARY     Status: Abnormal   Collection Time    05/04/14  8:23 PM      Result Value Ref Range   Glucose-Capillary 114 (*) 70 - 99 mg/dL  CBC WITH DIFFERENTIAL     Status: None   Collection Time    05/05/14  5:00 AM      Result Value Ref Range   WBC 6.3  4.0 - 10.5 K/uL   Comment: WHITE COUNT CONFIRMED ON SMEAR   RBC 4.79  4.22 - 5.81 MIL/uL   Hemoglobin 13.9  13.0 - 17.0 g/dL   HCT 40.9  39.0 - 52.0 %   MCV 85.4  78.0 - 100.0 fL   MCH 29.0  26.0 - 34.0 pg   MCHC 34.0  30.0 - 36.0 g/dL   RDW 12.6  11.5 - 15.5 %   Platelets 376  150 - 400 K/uL   Neutrophils Relative % 49  43 - 77 %   Lymphocytes Relative 39  12 - 46 %   Monocytes Relative 8  3 - 12 %   Eosinophils Relative 3  0 - 5 %   Basophils Relative 1  0 - 1 %   Neutro Abs 3.0  1.7 - 7.7 K/uL   Lymphs Abs 2.5  0.7 - 4.0 K/uL   Monocytes Absolute 0.5  0.1 - 1.0 K/uL   Eosinophils Absolute 0.2  0.0 - 0.7 K/uL   Basophils Absolute 0.1  0.0 - 0.1 K/uL   Smear Review MORPHOLOGY UNREMARKABLE    COMPREHENSIVE METABOLIC PANEL     Status: Abnormal   Collection Time    05/05/14  5:00 AM      Result Value Ref Range   Sodium 140  137 - 147 mEq/L   Potassium 3.6 (*) 3.7 - 5.3 mEq/L   Chloride 101  96 - 112 mEq/L   CO2 26  19 - 32 mEq/L   Glucose, Bld 87  70 - 99 mg/dL   BUN 9  6 - 23 mg/dL   Creatinine, Ser 0.76  0.50 - 1.35 mg/dL   Calcium 9.4  8.4 - 10.5 mg/dL   Total Protein 7.7  6.0 - 8.3 g/dL   Albumin 3.7  3.5 - 5.2 g/dL   AST 26  0 - 37 U/L   Comment: HEMOLYSIS AT  THIS LEVEL MAY AFFECT RESULT   ALT 21  0 - 53 U/L   Alkaline Phosphatase 74  39 - 117 U/L   Total Bilirubin 0.4  0.3 - 1.2 mg/dL   GFR calc non Af Amer >90  >90 mL/min   GFR calc Af Amer >90  >90 mL/min   Comment: (NOTE)     The eGFR has been calculated using the CKD EPI equation.     This calculation has not been validated in all clinical situations.     eGFR's persistently <90 mL/min signify possible Chronic Kidney     Disease.   Anion gap 13  5 - 15  GLUCOSE, CAPILLARY     Status: None   Collection Time  05/05/14  6:34 AM      Result Value Ref Range   Glucose-Capillary 88  70 - 99 mg/dL  GLUCOSE, CAPILLARY     Status: Abnormal   Collection Time    05/05/14 12:10 PM      Result Value Ref Range   Glucose-Capillary 135 (*) 70 - 99 mg/dL  GLUCOSE, CAPILLARY     Status: Abnormal   Collection Time    05/05/14  4:46 PM      Result Value Ref Range   Glucose-Capillary 129 (*) 70 - 99 mg/dL  GLUCOSE, CAPILLARY     Status: None   Collection Time    05/05/14  9:34 PM      Result Value Ref Range   Glucose-Capillary 84  70 - 99 mg/dL  GLUCOSE, CAPILLARY     Status: Abnormal   Collection Time    05/06/14  7:15 AM      Result Value Ref Range   Glucose-Capillary 113 (*) 70 - 99 mg/dL      Physical Exam  Constitutional: He is oriented to person, place, and time. He appears well-developed.  HENT:  Right facial droop  Eyes: EOM are normal.  Neck: Normal range of motion. Neck supple. No thyromegaly present.  Cardiovascular: Normal rate and regular rhythm.  Respiratory: Effort normal and breath sounds normal. No respiratory distress.  GI: Soft. Bowel sounds are normal. He exhibits no distension.  Neurological: He is alert and oriented to person, place, and time.  Speech is dysarthric but intelligible. He does follow simple commands. Fair awareness of his deficits. RUE 2/5 prox to distal, inconsistent.. Has difficulty with attention and sequencing. Had no overt weakness or FMC issues  in the left arm and leg. ?visual acuity inconsistent.  Skin: Skin is warm and dry.  Psychiatric:emotional lability   Assessment/Plan: 1. 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 Physiatrist is providing close team supervision and 24 hour management of active medical problems listed below. Physiatrist and rehab team continue to assess barriers to discharge/monitor patient progress toward functional and medical goals. FIM: FIM - Bathing Bathing Steps Patient Completed: Chest;Right Arm;Abdomen;Front perineal area;Buttocks;Right upper leg;Left upper leg;Right lower leg (including foot);Left lower leg (including foot) Bathing: 4: Min-Patient completes 8-9 78f10 parts or 75+ percent  FIM - Upper Body Dressing/Undressing Upper body dressing/undressing steps patient completed: Thread/unthread right sleeve of pullover shirt/dresss;Put head through opening of pull over shirt/dress Upper body dressing/undressing: 3: Mod-Patient completed 50-74% of tasks FIM - Lower Body Dressing/Undressing Lower body dressing/undressing steps patient completed: Thread/unthread right underwear leg;Thread/unthread left underwear leg;Pull underwear up/down;Thread/unthread right pants leg;Thread/unthread left pants leg;Pull pants up/down;Don/Doff right sock;Don/Doff left sock Lower body dressing/undressing: 4: Min-Patient completed 75 plus % of tasks  FIM - Toileting Toileting: 0: Activity did not occur     FIM - BControl and instrumentation engineerDevices: Arm rests Bed/Chair Transfer: 4: Bed > Chair or W/C: Min A (steadying Pt. > 75%)  FIM - Locomotion: Wheelchair Distance: 150 Locomotion: Wheelchair: 4: Travels 150 ft or more: maneuvers on rugs and over door sillls with minimal assistance (Pt.>75%) FIM - Locomotion:  Ambulation Locomotion: Ambulation Assistive Devices: Other (comment) (HHA or L rail in hallway) Ambulation/Gait Assistance: 4: Min assist;3: Mod assist Locomotion: Ambulation: 2: Travels 50 - 149 ft with moderate assistance (Pt: 50 - 74%)  Comprehension  Comprehension Mode: Auditory Comprehension: 3-Understands basic 50 - 74% of the time/requires cueing 25 - 50%  of the time  Expression Expression Mode: Verbal Expression: 5-Expresses basic 90% of the time/requires cueing < 10% of the time.  Social Interaction Social Interaction: 5-Interacts appropriately 90% of the time - Needs monitoring or encouragement for participation or interaction.  Problem Solving Problem Solving: 4-Solves basic 75 - 89% of the time/requires cueing 10 - 24% of the time  Memory Memory: 3-Recognizes or recalls 50 - 74% of the time/requires cueing 25 - 49% of the time  Medical Problem List and Plan:  1. 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 DVT Prophylaxis/Anticoagulation: Subcutaneous heparin. Monitor platelet counts any signs of bleeding  3. Pain Management: Ultram as needed. Monitor with increased mobility  4. Diabetes mellitus with peripheral neuropathy. Hemoglobin A1c 11.3. NovoLog 4 units 3 times a day, Lantus insulin 20 units daily. Check CBGs a.c. and at bedtime. Well controlled at present 5. Neuropsych: This patient is not capable of making decisions on his own behalf.  6. Skin/Wound Care: Routine skin checks  7. Fluids/Electrolytes/Nutrition: Strict I and O.'s. Followup chemistries. Provide nutritional supplements as needed  8. Hyperlipidemia. Lipitor  9. Poor medical compliance due to financial constraints. Follow financial counselor   LOS (Days) 2 A [place] E 05/06/2014, 10:47 AM

## 2014-05-06 NOTE — IPOC Note (Signed)
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  Hospital Problems: Active Problems:   CVA (cerebral infarction)   Acute left hemiparesis     Functional Problem List: Nursing Endurance;Medication Management;Motor;Nutrition;Pain;Safety;Skin Integrity  PT Balance;Edema;Endurance;Motor;Nutrition;Perception;Safety;Sensory  OT Balance;Cognition;Endurance;Motor;Safety;Perception;Sensory;Vision  SLP Safety;Cognition  TR         Basic ADL's: OT Eating;Grooming;Bathing;Dressing;Toileting     Advanced  ADL's: OT       Transfers: PT Bed Mobility;Bed to Chair;Car;Furniture  OT Toilet;Tub/Shower     Locomotion: PT Wheelchair Mobility;Stairs;Ambulation     Additional Impairments: OT Fuctional Use of Upper Extremity  SLP Social Cognition;Communication expression Problem Solving;Memory;Attention;Awareness  TR      Anticipated Outcomes Item Anticipated Outcome  Self Feeding modified independent level  Swallowing      Basic self-care  supervision  Toileting  supervision   Bathroom Transfers supervision  Bowel/Bladder  mod I  Transfers  supervision  Locomotion  supervision  Communication     Cognition  min assist  Pain  less than 3 on 0-10 scale   Safety/Judgment  min assist    Therapy Plan: PT Intensity: Minimum of 1-2 x/day ,45 to 90 minutes PT Frequency: 5 out of 7 days PT Duration Estimated Length of Stay: 7-10 days OT Intensity: Minimum of 1-2 x/day, 45 to 90 minutes OT Frequency: 5 out of 7 days OT Duration/Estimated Length of Stay: 10-12 days SLP Intensity: Minumum of 1-2 x/day, 30 to 90 minutes SLP Frequency: 5 out of 7 days SLP Duration/Estimated Length of Stay: 10 days        Team Interventions: Nursing Interventions Patient/Family Education;Disease Management/Prevention;Pain Management;Medication Management;Skin Care/Wound Management;Discharge Planning   PT interventions Ambulation/gait training;Balance/vestibular training;Discharge planning;Disease management/prevention;DME/adaptive equipment instruction;Functional mobility training;Neuromuscular re-education;Patient/family education;Stair training;Therapeutic Activities;Therapeutic Exercise;UE/LE Strength taining/ROM;UE/LE Coordination activities;Wheelchair propulsion/positioning  OT Interventions Balance/vestibular training;Cognitive remediation/compensation;DME/adaptive equipment instruction;Disease mangement/prevention;Community reintegration;Functional electrical stimulation;Functional mobility training;Discharge planning;Psychosocial support;Therapeutic Activities;Visual/perceptual remediation/compensation;UE/LE Coordination activities;Patient/family education;Splinting/orthotics;UE/LE Strength taining/ROM;Therapeutic Exercise;Self Care/advanced ADL retraining;Neuromuscular re-education  SLP Interventions Cognitive remediation/compensation;Cueing hierarchy;Internal/external aids;Patient/family education;Environmental controls  TR Interventions    SW/CM Interventions Discharge Planning;Psychosocial Support;Patient/Family Education    Team Discharge Planning: Destination: PT-Home ,OT- Home , SLP- (TBD) Projected Follow-up: PT-Home health PT, OT-  Home health OT, SLP-Other (comment) (TBD pending progress while inpatient ) Projected Equipment Needs: PT-To be determined, OT- To be determined, SLP-  Equipment Details: PT- , OT-  Patient/family involved in discharge planning: PT- Patient,  OT-Patient, SLP-Patient  MD ELOS: 7-10d          Medical Rehab Prognosis:  Good Assessment: 53 y.o. 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 Patient noncompliant with medications at home due to financial issues. Patient lives alone  and used walker prior to admission. Admitted 04/30/2014 with blurred vision and headache. 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 MRA of the brain with chronic occlusion of left PCA. Patient did not receive TPA. Echocardiogram with ejection fraction 40% grade 1 diastolic dysfunction. Carotid Dopplers with no ICA stenosis.Lower Extremity Dopplers negative DVT. TEE completed showing no LAA thrombus and small incidental findings of small PFO. No plan for loop recorder  Now requiring 24/7 Rehab RN,MD, as well as CIR level PT, OT and SLP.  Treatment team will focus on ADLs and mobility with goals  set at Sup    See Team Conference Notes for weekly updates to the plan of care

## 2014-05-06 NOTE — Progress Notes (Signed)
Physical Therapy Session Note  Patient Details  Name: Adam Callahan MRN: [id number] Date of Birth: 1960/11/06  Today's Date: 05/06/2014 PT Individual Time: 0832-0920 and 1133-1203 PT Individual Time Calculation (min): 48 min and 30 min  Short Term Goals: Week 1:  PT Short Term Goal 1 (Week 1): = LTGs due to anticipated LOS  Skilled Therapeutic Interventions/Progress Updates:   Session 1: Pt received sitting in w/c, agreeable to therapy. Continued discussion to identify new physical deficits following most current R PCA infarct and pt unable to verbalize anything different with mobility other than perseverating on "I could take a bath and get dressed," and stating, "Maybe I hit my knee on something," in regards to chronic RLE hemiplegia. Pt educated on impairments due to L PCA infarct s/p 2014 and how stroke impacts brain/body function. Pt propelled w/c using L hemi technique 2 x 150 ft with supervision-min A and verbal/tactile cues to decrease use of trunk momentum and utilize LUE on wheel rim for propulsion. Gait training with R HHA with R ankle DF wrap assist and therapist manually blocking R knee hyperextension during stance phase x 90 ft. Ankle DF wrap assist with no noted benefit due to tone and decreased ankle DF AROM during gait. Progressed to using L rail for HHA and therapist manually facilitating knee flexion in terminal stance, swing through, and heel strike on RLE. Increased tone noted. Pt negotiated up/down 5 stairs using L rail and R HHA, max cues for sequencing and overall min A, therapist blocking R knee hyperextension when descending with RLE. Pt propelled back to room and left sitting in w/c with quick release belt on and all needs within reach.   Session 2: Focus on gait training with NMES using Bioness L300 to facilitate R ankle dorsiflexion at heel strike and limit R knee hyperextension during stance phase. Gait training 2 x 50 ft with min A and verbal cues for R heel strike  and increased L step length to facilitate step-through pattern. Pt with decreased weight shift to R and decreased RLE WB, with manual facilitation of weight shifting patient demonstrates improved fluidity of gait and intermittently able to control R knee hyperextension. Pt performed sit <> stand with focus on weight shifting to R to facilitate equal weightbearing through BLEs. Pt returned to room and left sitting in w/c with quick release belt on and all needs within reach.    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, confusion Restrictions Weight Bearing Restrictions: No Pain: Pain Assessment Pain Assessment: No/denies pain  See FIM for current functional status  Therapy/Group: Individual Therapy  Laretta Alstrom 05/06/2014, 10:25 AM

## 2014-05-06 NOTE — Progress Notes (Signed)
Occupational Therapy Session Note  Patient Details  Name: Adam Callahan MRN: [id number] Date of Birth: 07/16/1961  Today's Date: 05/06/2014 OT Individual Time: 1000-1100 OT Individual Time Calculation (min): 60 min    Short Term Goals: Week 1:  OT Short Term Goal 1 (Week 1): Pt performed bathing with supervison sit to stand. OT Short Term Goal 2 (Week 1): Pt will perform all dressing with supervision. OT Short Term Goal 3 (Week 1): Pt will perform toilet transfers with supervision to elevated toilet with rail vs 3:1. OT Short Term Goal 4 (Week 1): Pt will perform self AAROM exercises for the LUE with supervision following handout. OT Short Term Goal 5 (Week 1): Pt will perform walk-in shower transfers with supervision using LRAD.  Skilled Therapeutic Interventions/Progress Updates:    Pt performed shower transfer with mod assist ambulating to the shower from his wheelchair, without assistive device.  He was able to perform all bathing with min instructional cueing and min assist for sequencing and completion.  Max assist needed for integration of the RUE into washing the left arm.  Pt performed dressing sit to stand at the sink.  This session he was able to donn his pullover shirt but needed increased time and mod instructional cueing to begin with the RUE and push it over his elbow before attempting to pull the shirt over his head.  Therapist provided shoe buttons for shoes as pt cannot tie them.  Still unsure at this point his PLOF with selfcare tasks secondary to pt reporting that he was tying his shoes prior to admission and lives alone.  Per MRI no new changes in previous CVA that would have made his RUE paresis more severe.     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, confusion Restrictions Weight Bearing Restrictions: No  Pain: Pain Assessment Pain Assessment: No/denies pain ADL: See FIM for current  functional status  Therapy/Group: Individual Therapy  Crystallynn Noorani OTR/L 05/06/2014, 1:21 PM

## 2014-05-06 NOTE — Progress Notes (Signed)
Speech Language Pathology Daily Session Note  Patient Details  Name: Adam Callahan MRN: [id number] Date of Birth: 03/22/1961  Today's Date: 05/06/2014 SLP Individual Time: 1315-1400 SLP Individual Time Calculation (min): 45 min  Short Term Goals: Week 1: SLP Short Term Goal 1 (Week 1): Pt will improve functional problem solving during basic self care tasks for 75% accuracy with min assist.  SLP Short Term Goal 2 (Week 1): Pt will recall at least 3 compensatory strategies for memory to facilitate improved carryover of daily information.  SLP Short Term Goal 3 (Week 1): Pt will improve topic maintenance during functional conversations with no more than 3 cues needed for redirection.  SLP Short Term Goal 4 (Week 1): Pt will improve sustained attention during functional tasks for 5-7 minutes with min assist.    Skilled Therapeutic Interventions:  Pt was seen for skilled speech therapy targeting cognitive goals.  Upon arrival, pt was seated upright in the wheelchair, awake, alert, and pleasantly interactive.  SLP facilitated the session with max assist across 100% of opportunities for pt to recall 3 scheduled medications.  SLP provided pt with a written aid for medication management to facilitate improved recall of new, complex information.  SLP further facilitated the session with mod-max verbal cues for planning, thought organization, and error awareness to load a pill box with medications of varying dosages and frequencies.  SLP strongly recommended that pt have assistance for medication management at discharge, at which point pt became very tearful saying "I did everything right, I just ran out of money." (Referring to previous issues with medical compliance for financial reasons).  Pt benefited from mod encouragement from SLP for redirection.  Continue per current plan of care.    FIM:  Comprehension Comprehension Mode: Auditory Comprehension: 3-Understands basic 50 - 74% of the  time/requires cueing 25 - 50%  of the time Expression Expression Mode: Verbal Expression: 5-Expresses basic 90% of the time/requires cueing < 10% of the time. Social Interaction Social Interaction: 4-Interacts appropriately 75 - 89% of the time - Needs redirection for appropriate language or to initiate interaction. Problem Solving Problem Solving: 3-Solves basic 50 - 74% of the time/requires cueing 25 - 49% of the time Memory Memory: 2-Recognizes or recalls 25 - 49% of the time/requires cueing 51 - 75% of the time  Pain Pain Assessment Pain Assessment: No/denies pain  Therapy/Group: Individual Therapy  [name], M.A. CCC-SLP  Malayshia All, Selinda Orion 05/06/2014, 5:03 PM

## 2014-05-07 ENCOUNTER — Encounter (HOSPITAL_COMMUNITY): Payer: Self-pay

## 2014-05-07 ENCOUNTER — Inpatient Hospital Stay (HOSPITAL_COMMUNITY): Payer: BC Managed Care – PPO | Admitting: Physical Therapy

## 2014-05-07 LAB — GLUCOSE, CAPILLARY
GLUCOSE-CAPILLARY: 124 mg/dL — AB (ref 70–99)
Glucose-Capillary: 124 mg/dL — ABNORMAL HIGH (ref 70–99)
Glucose-Capillary: 100 mg/dL — ABNORMAL HIGH (ref 70–99)
Glucose-Capillary: 196 mg/dL — ABNORMAL HIGH (ref 70–99)

## 2014-05-07 NOTE — Progress Notes (Signed)
Occupational Therapy Session Note  Patient Details  Name: ALDER MURRI MRN: [id number] Date of Birth: Jan 10, 1961  Today's Date: 05/07/2014 OT Individual Time: 1045-1130 OT Individual Time Calculation (min): 45 min    Short Term Goals: Week 1:  OT Short Term Goal 1 (Week 1): Pt performed bathing with supervison sit to stand. OT Short Term Goal 2 (Week 1): Pt will perform all dressing with supervision. OT Short Term Goal 3 (Week 1): Pt will perform toilet transfers with supervision to elevated toilet with rail vs 3:1. OT Short Term Goal 4 (Week 1): Pt will perform self AAROM exercises for the LUE with supervision following handout. OT Short Term Goal 5 (Week 1): Pt will perform walk-in shower transfers with supervision using LRAD.  Skilled Therapeutic Interventions/Progress Updates:    Pt engaged in BADL retraining including bathing at shower level and dressing with sit<>stand from w/c.  Pt amb with HHA (mod A) to bathroom for shower and returned to room to complete dressing.  Pt required min verbal cues for compensatory techniques during bathing tasks but remembered strategies when dressing.  Pt required min verbal cues for redirection to tasks during bathing.  Focus on activity tolerance, functional amb with HHA, sit<>stand, standing balance, compensatory techniques/strategies for BADLs, and safety awareness.  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, confusion Restrictions Weight Bearing Restrictions: No   Pain: Pain Assessment Pain Assessment: No/denies pain  See FIM for current functional status  Therapy/Group: Individual Therapy  Leroy Libman 05/07/2014, 11:41 AM

## 2014-05-07 NOTE — Progress Notes (Signed)
Physical Therapy Session Note  Patient Details  Name: Adam Callahan MRN: [id number] Date of Birth: 1961-04-17  Today's Date: 05/07/2014 PT Individual Time: Session 1: 0800-0900 (60 min); Session 2: 1015-1045 (30 min); Session 3: 1300-1345 (45 min)  Short Term Goals: Week 1:  PT Short Term Goal 1 (Week 1): = LTGs due to anticipated LOS  Skilled Therapeutic Interventions/Progress Updates:   Session 1: Focus on neuro re-education for movement patterns with bed mobility and transfers. Pt received sitting EOB finishing breakfast, requesting to use bathroom. Pt ambulated from bed to bathroom and performed toilet transfer to standard toilet using grab bar with min A and verbal cues for safety. Pt able to perform hygiene but requires assist for clothing management. Pt washed hands at w/c level with verbal cues to include R hand with supervision. W/c mobility using L hemi technique with verbal/tactile cues to decrease use of trunk and utilize LUE on wheel rim x  150 ft with supervision. Squat pivot transfer training to increase safety and independence from w/c <> mat table to right and left with therapist manually facilitating forward lean and preventing patient from standing up completely, min A to supervision. Remainder of session pt performed bed mobility training with multiple reps to right and left for increased utilization of R UE/LE, decreasing compensatory strategies, breathing technique, and rolling versus pulling up on edge of mat with LUE. Pt with decreased memory/recall immediately following practice and able to verbalize technique when prompted x1 out of many opportunities and unable to perform sequencing without mod-max verbal cues. Practiced repositioning on mat table with therapist stabilizing BLEs to facilitate bridging with noted decreased motor planning and decreased trunk dissociation. Pt noted to have scratched L forearm resulting in bleeding during session, RN notified. Pt left sitting  in w/c with quick release belt on and all needs within reach.   Session 2: Session focused on RLE NMR and orientation/patient education. From standard arm chair, patient performed sit <> stand with focus on weightbearing through RLE. Forced use of RLE/NMR facilitated by leaning to R and standing without LUE support, therapist positioning LLE extended and blocking patient from using LLE only, and therapist manually facilitating weight shift to R in standing before allowing patient to sit. Patient unable to demonstrate weight shifting to R, only able to stick R hip out to side with trunk lean to L. Patient oriented to self and city only initially. With increased time he stated that he was at a nursing home and with cues that there are doctors, nurses, and therapists, patient able to state he is at the hospital. Patient continues to demonstrate confusion regarding old CVA and new CVA timeline and impairments. Pt unable to identify cause of chronic R sided hemiplegia from stroke last year and continues to state he must have hurt his arm and leg from injury by falling or hitting his head. Education provided. Patient left sitting in w/c with quick release belt on and all needs within reach.    Session 3: Pt received supine in bed, agreeable to therapy. Pt with no carryover of bed mobility training from previous session, requires total cues for sequencing for supine > sit with HOB flat. Pt performed stand pivot transfer bed <> w/c with min A, therapist facilitating weight shift to RLE. W/c mobility using L hemi technique, requires cues with each initiation of propulsion to use LUE vs whole trunk to propel w/c, 2 x 150 ft with supervision. In therapy gym, pt performed tall kneeling on  mat table with bench in front for UE support. Patient requires total A to maintain RUE on bench due to increased flexor tone/spasticity. Pt performed squats in tall kneeling with focus on glute and core activation as well as manual  facilitation for R sided WB. In sitting with propping on elbow to R and L, patient with no lateral trunk flexion noted. Seated edge of mat, worked on ipsilateral lateral trunk shortening and contralateral lateral trunk elongation with use of exercise ball with little benefit noted progressed to propping over bolster for prolonged stretch and trunk dissociation. Pt performed seated active cervical/trunk rotation L > R. Pt with no recall of squat pivot transfer training from earlier session. Requires min A for squat pivot transfer mat > w/c. Upon returning to room, pt requesting to return to bed and left semi reclined in bed with all needs within reach.   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, confusion Restrictions Weight Bearing Restrictions: No Pain: Pain Assessment Pain Assessment: No/denies pain  See FIM for current functional status  Therapy/Group: Individual Therapy  Laretta Alstrom 05/07/2014, 9:08 AM

## 2014-05-07 NOTE — Progress Notes (Signed)
 Subjective/Complaints: Patient with poor memory, perseverates on right upper extremity. We discussed once again that this was a chronic issue. His new stroke was on the right side and would not affect the right arm We discussed this is unlikely to improve since CVA was in 06/2013 Review of systems cannot obtain. Accurate reports since he has poor awareness and global cognitive deficit Objective: Vital Signs: Blood pressure 142/85, pulse 105, temperature 97.9 F (36.6 C), temperature source Oral, resp. rate 20, weight 75.932 kg (167 lb 6.4 oz), SpO2 98.00%. No results found. Results for orders placed during the hospital encounter of 05/04/14 (from the past 72 hour(s))  GLUCOSE, CAPILLARY     Status: Abnormal   Collection Time    05/04/14  8:23 PM      Result Value Ref Range   Glucose-Capillary 114 (*) 70 - 99 mg/dL  CBC WITH DIFFERENTIAL     Status: None   Collection Time    05/05/14  5:00 AM      Result Value Ref Range   WBC 6.3  4.0 - 10.5 K/uL   Comment: WHITE COUNT CONFIRMED ON SMEAR   RBC 4.79  4.22 - 5.81 MIL/uL   Hemoglobin 13.9  13.0 - 17.0 g/dL   HCT 40.9  39.0 - 52.0 %   MCV 85.4  78.0 - 100.0 fL   MCH 29.0  26.0 - 34.0 pg   MCHC 34.0  30.0 - 36.0 g/dL   RDW 12.6  11.5 - 15.5 %   Platelets 376  150 - 400 K/uL   Neutrophils Relative % 49  43 - 77 %   Lymphocytes Relative 39  12 - 46 %   Monocytes Relative 8  3 - 12 %   Eosinophils Relative 3  0 - 5 %   Basophils Relative 1  0 - 1 %   Neutro Abs 3.0  1.7 - 7.7 K/uL   Lymphs Abs 2.5  0.7 - 4.0 K/uL   Monocytes Absolute 0.5  0.1 - 1.0 K/uL   Eosinophils Absolute 0.2  0.0 - 0.7 K/uL   Basophils Absolute 0.1  0.0 - 0.1 K/uL   Smear Review MORPHOLOGY UNREMARKABLE    COMPREHENSIVE METABOLIC PANEL     Status: Abnormal   Collection Time    05/05/14  5:00 AM      Result Value Ref Range   Sodium 140  137 - 147 mEq/L   Potassium 3.6 (*) 3.7 - 5.3 mEq/L   Chloride 101  96 - 112 mEq/L   CO2 26  19 - 32 mEq/L   Glucose,  Bld 87  70 - 99 mg/dL   BUN 9  6 - 23 mg/dL   Creatinine, Ser 0.76  0.50 - 1.35 mg/dL   Calcium 9.4  8.4 - 10.5 mg/dL   Total Protein 7.7  6.0 - 8.3 g/dL   Albumin 3.7  3.5 - 5.2 g/dL   AST 26  0 - 37 U/L   Comment: HEMOLYSIS AT THIS LEVEL MAY AFFECT RESULT   ALT 21  0 - 53 U/L   Alkaline Phosphatase 74  39 - 117 U/L   Total Bilirubin 0.4  0.3 - 1.2 mg/dL   GFR calc non Af Amer >90  >90 mL/min   GFR calc Af Amer >90  >90 mL/min   Comment: (NOTE)     The eGFR has been calculated using the CKD EPI equation.     This calculation has not been validated in all clinical   situations.     eGFR's persistently <90 mL/min signify possible Chronic Kidney     Disease.   Anion gap 13  5 - 15  GLUCOSE, CAPILLARY     Status: None   Collection Time    05/05/14  6:34 AM      Result Value Ref Range   Glucose-Capillary 88  70 - 99 mg/dL  GLUCOSE, CAPILLARY     Status: Abnormal   Collection Time    05/05/14 12:10 PM      Result Value Ref Range   Glucose-Capillary 135 (*) 70 - 99 mg/dL  GLUCOSE, CAPILLARY     Status: Abnormal   Collection Time    05/05/14  4:46 PM      Result Value Ref Range   Glucose-Capillary 129 (*) 70 - 99 mg/dL  GLUCOSE, CAPILLARY     Status: None   Collection Time    05/05/14  9:34 PM      Result Value Ref Range   Glucose-Capillary 84  70 - 99 mg/dL  GLUCOSE, CAPILLARY     Status: Abnormal   Collection Time    05/06/14  7:15 AM      Result Value Ref Range   Glucose-Capillary 113 (*) 70 - 99 mg/dL  GLUCOSE, CAPILLARY     Status: Abnormal   Collection Time    05/06/14 11:23 AM      Result Value Ref Range   Glucose-Capillary 106 (*) 70 - 99 mg/dL  GLUCOSE, CAPILLARY     Status: Abnormal   Collection Time    05/06/14  4:53 PM      Result Value Ref Range   Glucose-Capillary 151 (*) 70 - 99 mg/dL  GLUCOSE, CAPILLARY     Status: None   Collection Time    05/06/14  8:45 PM      Result Value Ref Range   Glucose-Capillary 93  70 - 99 mg/dL   Comment 1 Notify RN     GLUCOSE, CAPILLARY     Status: Abnormal   Collection Time    05/07/14  6:58 AM      Result Value Ref Range   Glucose-Capillary 124 (*) 70 - 99 mg/dL   Comment 1 Notify RN        Physical Exam  Constitutional: He is oriented to person, place, and time. He appears well-developed.  HENT:  Right facial droop  Eyes: EOM are normal.  Neck: Normal range of motion. Neck supple. No thyromegaly present.  Cardiovascular: Normal rate and regular rhythm.  Respiratory: Effort normal and breath sounds normal. No respiratory distress.  GI: Soft. Bowel sounds are normal. He exhibits no distension.  Neurological: He is alert and oriented to person, place, and time.  Speech is dysarthric but intelligible. He does follow simple commands. Fair awareness of his deficits. RUE 2/5 prox to distal, inconsistent.. Has difficulty with attention and sequencing. Had no overt weakness or FMC issues in the left arm and leg. ?visual acuity inconsistent.  Skin: Skin is warm and dry.  Psychiatric:emotional lability   Assessment/Plan: 1. 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 Physiatrist is providing close team supervision and 24 hour management of active medical problems listed below. Physiatrist and rehab team continue to assess barriers to discharge/monitor patient progress toward functional and medical goals. FIM: FIM - Bathing Bathing Steps Patient   Completed: Chest;Right Arm;Abdomen;Front perineal area;Buttocks;Right upper leg;Left upper leg;Right lower leg (including foot);Left lower leg (including foot) Bathing: 4: Min-Patient completes 8-9 0f 10 parts or 75+ percent  FIM - Upper Body Dressing/Undressing Upper body dressing/undressing steps patient completed: Thread/unthread right sleeve of pullover shirt/dresss;Put head  through opening of pull over shirt/dress;Thread/unthread left sleeve of pullover shirt/dress;Pull shirt over trunk Upper body dressing/undressing: 5: Supervision: Safety issues/verbal cues FIM - Lower Body Dressing/Undressing Lower body dressing/undressing steps patient completed: Thread/unthread right underwear leg;Thread/unthread left underwear leg;Pull underwear up/down;Thread/unthread right pants leg;Thread/unthread left pants leg;Pull pants up/down;Don/Doff left shoe;Don/Doff right shoe Lower body dressing/undressing: 4: Min-Patient completed 75 plus % of tasks  FIM - Toileting Toileting: 0: Activity did not occur     FIM - Bed/Chair Transfer Bed/Chair Transfer Assistive Devices: Arm rests Bed/Chair Transfer: 4: Bed > Chair or W/C: Min A (steadying Pt. > 75%)  FIM - Locomotion: Wheelchair Distance: 150 Locomotion: Wheelchair: 4: Travels 150 ft or more: maneuvers on rugs and over door sillls with minimal assistance (Pt.>75%) FIM - Locomotion: Ambulation Locomotion: Ambulation Assistive Devices: Other (comment) (HHA or L rail in hallway) Ambulation/Gait Assistance: 4: Min assist;3: Mod assist Locomotion: Ambulation: 2: Travels 50 - 149 ft with moderate assistance (Pt: 50 - 74%)  Comprehension Comprehension Mode: Auditory Comprehension: 4-Understands basic 75 - 89% of the time/requires cueing 10 - 24% of the time  Expression Expression Mode: Verbal Expression: 4-Expresses basic 75 - 89% of the time/requires cueing 10 - 24% of the time. Needs helper to occlude trach/needs to repeat words.  Social Interaction Social Interaction: 4-Interacts appropriately 75 - 89% of the time - Needs redirection for appropriate language or to initiate interaction.  Problem Solving Problem Solving: 4-Solves basic 75 - 89% of the time/requires cueing 10 - 24% of the time  Memory Memory: 4-Recognizes or recalls 75 - 89% of the time/requires cueing 10 - 24% of the time  Medical Problem List and  Plan:  1. 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 DVT Prophylaxis/Anticoagulation: Subcutaneous heparin. Monitor platelet counts any signs of bleeding  3. Pain Management: Ultram as needed. Monitor with increased mobility  4. Diabetes mellitus with peripheral neuropathy. Hemoglobin A1c 11.3. NovoLog 4 units 3 times a day, Lantus insulin 20 units daily. Check CBGs a.c. and at bedtime. Well controlled at present 5. Neuropsych: This patient is not capable of making decisions on his own behalf.  6. Skin/Wound Care: Routine skin checks  7. Fluids/Electrolytes/Nutrition: Strict I and O.'s. Followup chemistries. Provide nutritional supplements as needed  8. Hyperlipidemia. Lipitor  9. Poor medical compliance due to financial constraints. Follow financial counselor   LOS (Days) 3 A FACE TO FACE EVALUATION WAS PERFORMED  KIRSTEINS,ANDREW E 05/07/2014, 10:35 AM    

## 2014-05-07 NOTE — Plan of Care (Signed)
Problem: RH BOWEL ELIMINATION Goal: RH STG MANAGE BOWEL WITH ASSISTANCE STG Manage Bowel with min Assistance.  Outcome: Progressing LBM 10/16 Goal: RH STG MANAGE BOWEL W/MEDICATION W/ASSISTANCE STG Manage Bowel with Medication with min Assistance.  Outcome: Progressing LBM 10/16 Goal: RH STG MANAGE BOWEL W/EQUIPMENT W/ASSISTANCE STG Manage Bowel With Equipment With mod Assistance  Outcome: Progressing LBM 10/16

## 2014-05-08 ENCOUNTER — Inpatient Hospital Stay (HOSPITAL_COMMUNITY): Payer: Self-pay | Admitting: Physical Therapy

## 2014-05-08 LAB — GLUCOSE, CAPILLARY
GLUCOSE-CAPILLARY: 140 mg/dL — AB (ref 70–99)
Glucose-Capillary: 160 mg/dL — ABNORMAL HIGH (ref 70–99)
Glucose-Capillary: 87 mg/dL (ref 70–99)
Glucose-Capillary: 134 mg/dL — ABNORMAL HIGH (ref 70–99)

## 2014-05-08 NOTE — Progress Notes (Signed)
Physical Therapy Session Note  Patient Details  Name: Adam Callahan MRN: [id number] Date of Birth: 09-06-1960  Today's Date: 05/08/2014 PT Individual Time: 6789-3810 PT Individual Time Calculation (min): 60 min   Short Term Goals: Week 1:  PT Short Term Goal 1 (Week 1): = LTGs due to anticipated LOS  Skilled Therapeutic Interventions/Progress Updates:    Pt demonstrates improvement using DF assist wrap in session for increased LE clearance and improved stability. Pt may be a candidate for AFO to maximize safety with home mobility. Pt with 10 degrees ROM increase following back and rib mobilization techniques. Pt still limited carryover into active movement though. Pt would continue to benefit from skilled PT services to increase functional mobility.   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 Pt performs gait training with cues for tonic core control, LUE activation, and increased LE clearance, pt trialed with theraband DF assist wrap. Locomotion : Ambulation Ambulation/Gait Assistance: 3: Mod assist 90'x4 with cues for core control and LE clearance. Wheelchair Mobility Distance: 150'x2  Balance: Dynamic Sitting Balance Dynamic Sitting - Balance Support: No upper extremity supported Dynamic Sitting - Level of Assistance: 5: Stand by assistance Static Standing Balance Static Standing - Balance Support: No upper extremity supported Static Standing - Level of Assistance: 5: Stand by assistance Camera operator) Dynamic Standing Balance Dynamic Standing - Balance Support: Right upper extremity supported (and LUE support for trials) Dynamic Standing - Level of Assistance: 4: Min assist  Other Treatments:   Pt performs  transfers x20 in session. Pt performs LUE shoulder abd and flexion  AAROM with ventilatory cues following quadratus lumborum and L/S releases. Rib mobs also performed. R sided diaphragm breathing performed with facilitation followed by active participation. LAQs with ventilatory cues. Pt performs standing with RUE forced use pre-gait 4x10. Pt performs static standing balance 2'x2. Pt educated on nature on injury, rehab plan, and safety in mobility.  See FIM for current functional status  Therapy/Group: Individual Therapy  Adam Callahan 05/08/2014, 12:50 PM

## 2014-05-08 NOTE — Progress Notes (Signed)
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 Accurate reports since he has poor awareness and global cognitive deficit Objective: Vital Signs: Blood pressure 141/82, pulse 82, temperature 98.3 F (36.8 C), temperature source Oral, resp. rate 20, weight 75.932 kg (167 lb 6.4 oz), SpO2 100.00%. No results found. Results for orders placed during the hospital encounter of 05/04/14 (from the past 72 hour(s))  GLUCOSE, CAPILLARY     Status: Abnormal   Collection Time    05/05/14 12:10 PM      Result Value Ref Range   Glucose-Capillary 135 (*) 70 - 99 mg/dL  GLUCOSE, CAPILLARY     Status: Abnormal   Collection Time    05/05/14  4:46 PM      Result Value Ref Range   Glucose-Capillary 129 (*) 70 - 99 mg/dL  GLUCOSE, CAPILLARY     Status: None   Collection Time    05/05/14  9:34 PM      Result Value Ref Range   Glucose-Capillary 84  70 - 99 mg/dL  GLUCOSE, CAPILLARY     Status: Abnormal   Collection Time    05/06/14  7:15 AM      Result Value Ref Range   Glucose-Capillary 113 (*) 70 - 99 mg/dL  GLUCOSE, CAPILLARY     Status: Abnormal   Collection Time    05/06/14 11:23 AM      Result Value Ref Range   Glucose-Capillary 106 (*) 70 - 99 mg/dL  GLUCOSE, CAPILLARY     Status: Abnormal   Collection Time    05/06/14  4:53 PM      Result Value Ref Range   Glucose-Capillary 151 (*) 70 - 99 mg/dL  GLUCOSE, CAPILLARY     Status: None   Collection Time    05/06/14  8:45 PM      Result Value Ref Range   Glucose-Capillary 93  70 - 99 mg/dL   Comment 1 Notify RN    GLUCOSE, CAPILLARY     Status: Abnormal   Collection Time    05/07/14  6:58 AM      Result Value Ref Range   Glucose-Capillary 124 (*) 70 - 99 mg/dL   Comment 1 Notify RN    GLUCOSE, CAPILLARY     Status: Abnormal   Collection Time    05/07/14 11:46 AM      Result Value Ref Range   Glucose-Capillary 124 (*) 70 - 99 mg/dL   Comment 1 Notify RN    GLUCOSE, CAPILLARY     Status: Abnormal   Collection Time    05/07/14  5:07 PM      Result Value Ref Range   Glucose-Capillary 100 (*) 70 - 99 mg/dL   Comment 1 Notify RN    GLUCOSE, CAPILLARY     Status: Abnormal   Collection Time    05/07/14  8:35 PM      Result Value Ref Range   Glucose-Capillary 196 (*) 70 - 99 mg/dL   Comment 1 Notify RN    GLUCOSE, CAPILLARY     Status: Abnormal   Collection Time    05/08/14  6:46 AM      Result Value Ref Range   Glucose-Capillary 160 (*) 70 - 99 mg/dL   Comment 1 Notify RN        Physical Exam  Constitutional: He is oriented to person, place, and time.  He appears well-developed.  HENT:  Right facial droop  Eyes: EOM are normal.  Neck: Normal range of motion. Neck supple. No thyromegaly present.  Cardiovascular: Normal rate and regular rhythm.  Respiratory: Effort normal and breath sounds normal. No respiratory distress.  GI: Soft. Bowel sounds are normal. He exhibits no distension.  Neurological: He is alert and oriented to person, place, and time.  Speech is dysarthric but intelligible. He does follow simple commands. Fair awareness of his deficits. RUE 2/5 prox to distal, inconsistent.. Has difficulty with attention and sequencing. Had no overt weakness or FMC issues in the left arm and leg. ?visual acuity inconsistent.  Skin: Skin is warm and dry.  Psychiatric:emotional lability   Assessment/Plan: 1. 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 Physiatrist is providing close team supervision and 24 hour management of active medical problems listed below. Physiatrist and rehab team continue to assess barriers to discharge/monitor patient progress toward functional and medical goals. FIM: FIM  - Bathing Bathing Steps Patient Completed: Chest;Right Arm;Abdomen;Front perineal area;Buttocks;Right upper leg;Left upper leg;Right lower leg (including foot);Left lower leg (including foot) Bathing: 4: Min-Patient completes 8-9 16f 10 parts or 75+ percent  FIM - Upper Body Dressing/Undressing Upper body dressing/undressing steps patient completed: Thread/unthread right sleeve of front closure shirt/dress;Thread/unthread left sleeve of front closure shirt/dress;Pull shirt around back of front closure shirt/dress Upper body dressing/undressing: 4: Min-Patient completed 75 plus % of tasks FIM - Lower Body Dressing/Undressing Lower body dressing/undressing steps patient completed: Thread/unthread right underwear leg;Thread/unthread left underwear leg;Pull underwear up/down;Thread/unthread right pants leg;Thread/unthread left pants leg;Pull pants up/down Lower body dressing/undressing: 4: Min-Patient completed 75 plus % of tasks  FIM - Toileting Toileting: 0: Activity did not occur     FIM - Control and instrumentation engineer Devices: Arm rests Bed/Chair Transfer: 4: Bed > Chair or W/C: Min A (steadying Pt. > 75%);4: Chair or W/C > Bed: Min A (steadying Pt. > 75%);5: Supine > Sit: Supervision (verbal cues/safety issues);5: Sit > Supine: Supervision (verbal cues/safety issues)  FIM - Locomotion: Wheelchair Distance: 150 Locomotion: Wheelchair: 5: Travels 150 ft or more: maneuvers on rugs and over door sills with supervision, cueing or coaxing FIM - Locomotion: Ambulation Locomotion: Ambulation Assistive Devices: Other (comment) (HHA) Ambulation/Gait Assistance: 4: Min assist;3: Mod assist Locomotion: Ambulation: 1: Travels less than 50 ft with moderate assistance (Pt: 50 - 74%)  Comprehension Comprehension Mode: Auditory Comprehension: 4-Understands basic 75 - 89% of the time/requires cueing 10 - 24% of the time  Expression Expression Mode: Verbal Expression: 4-Expresses  basic 75 - 89% of the time/requires cueing 10 - 24% of the time. Needs helper to occlude trach/needs to repeat words.  Social Interaction Social Interaction: 4-Interacts appropriately 75 - 89% of the time - Needs redirection for appropriate language or to initiate interaction.  Problem Solving Problem Solving: 4-Solves basic 75 - 89% of the time/requires cueing 10 - 24% of the time  Memory Memory: 4-Recognizes or recalls 75 - 89% of the time/requires cueing 10 - 24% of the time  Medical Problem List and Plan:  1. 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 DVT Prophylaxis/Anticoagulation: Subcutaneous heparin. Monitor platelet counts any signs of bleeding  3. Pain Management: Ultram as needed. Monitor with increased mobility  4. Diabetes  mellitus with peripheral neuropathy. Hemoglobin A1c 11.3. NovoLog 4 units 3 times a day, Lantus insulin 20 units daily. Check CBGs a.c. and at bedtime. Well controlled at present 5. Neuropsych: This patient is not capable of making decisions on his own behalf.  6. Skin/Wound Care: Routine skin checks  7. Fluids/Electrolytes/Nutrition: Strict I and O.'s. Followup chemistries. Provide nutritional supplements as needed  8. Hyperlipidemia. Lipitor  9. Poor medical compliance due to financial constraints. Follow financial counselor   LOS (Days) [place] EVALUATION WAS PERFORMED  KIRSTEINS,ANDREW E 05/08/2014, 10:10 AM

## 2014-05-09 ENCOUNTER — Inpatient Hospital Stay (HOSPITAL_COMMUNITY): Payer: BC Managed Care – PPO | Admitting: Occupational Therapy

## 2014-05-09 ENCOUNTER — Inpatient Hospital Stay (HOSPITAL_COMMUNITY): Payer: BC Managed Care – PPO | Admitting: Speech Pathology

## 2014-05-09 ENCOUNTER — Inpatient Hospital Stay (HOSPITAL_COMMUNITY): Payer: BC Managed Care – PPO | Admitting: Physical Therapy

## 2014-05-09 DIAGNOSIS — E1165 Type 2 diabetes mellitus with hyperglycemia: Secondary | ICD-10-CM

## 2014-05-09 LAB — GLUCOSE, CAPILLARY
GLUCOSE-CAPILLARY: 92 mg/dL (ref 70–99)
Glucose-Capillary: 138 mg/dL — ABNORMAL HIGH (ref 70–99)
Glucose-Capillary: 121 mg/dL — ABNORMAL HIGH (ref 70–99)
Glucose-Capillary: 111 mg/dL — ABNORMAL HIGH (ref 70–99)

## 2014-05-09 NOTE — Progress Notes (Signed)
Subjective/Complaints:  No complaints today Significant expressive aphasia Review of systems cannot obtain. Accurate reports since he has poor awareness and global cognitive deficit Objective: Vital Signs: Blood pressure 134/84, pulse 84, temperature 98.2 F (36.8 C), temperature source Oral, resp. rate 18, weight 75.932 kg (167 lb 6.4 oz), SpO2 100.00%. No results found. Results for orders placed during the hospital encounter of 05/04/14 (from the past 72 hour(s))  GLUCOSE, CAPILLARY     Status: Abnormal   Collection Time    05/06/14 11:23 AM      Result Value Ref Range   Glucose-Capillary 106 (*) 70 - 99 mg/dL  GLUCOSE, CAPILLARY     Status: Abnormal   Collection Time    05/06/14  4:53 PM      Result Value Ref Range   Glucose-Capillary 151 (*) 70 - 99 mg/dL  GLUCOSE, CAPILLARY     Status: None   Collection Time    05/06/14  8:45 PM      Result Value Ref Range   Glucose-Capillary 93  70 - 99 mg/dL   Comment 1 Notify RN    GLUCOSE, CAPILLARY     Status: Abnormal   Collection Time    05/07/14  6:58 AM      Result Value Ref Range   Glucose-Capillary 124 (*) 70 - 99 mg/dL   Comment 1 Notify RN    GLUCOSE, CAPILLARY     Status: Abnormal   Collection Time    05/07/14 11:46 AM      Result Value Ref Range   Glucose-Capillary 124 (*) 70 - 99 mg/dL   Comment 1 Notify RN    GLUCOSE, CAPILLARY     Status: Abnormal   Collection Time    05/07/14  5:07 PM      Result Value Ref Range   Glucose-Capillary 100 (*) 70 - 99 mg/dL   Comment 1 Notify RN    GLUCOSE, CAPILLARY     Status: Abnormal   Collection Time    05/07/14  8:35 PM      Result Value Ref Range   Glucose-Capillary 196 (*) 70 - 99 mg/dL   Comment 1 Notify RN    GLUCOSE, CAPILLARY     Status: Abnormal   Collection Time    05/08/14  6:46 AM      Result Value Ref Range   Glucose-Capillary 160 (*) 70 - 99 mg/dL   Comment 1 Notify RN    GLUCOSE, CAPILLARY     Status: None   Collection Time    05/08/14 12:04 PM    Result Value Ref Range   Glucose-Capillary 87  70 - 99 mg/dL   Comment 1 Notify RN    GLUCOSE, CAPILLARY     Status: Abnormal   Collection Time    05/08/14  4:44 PM      Result Value Ref Range   Glucose-Capillary 140 (*) 70 - 99 mg/dL   Comment 1 Notify RN    GLUCOSE, CAPILLARY     Status: Abnormal   Collection Time    05/08/14  8:31 PM      Result Value Ref Range   Glucose-Capillary 134 (*) 70 - 99 mg/dL  GLUCOSE, CAPILLARY     Status: Abnormal   Collection Time    05/09/14  7:29 AM      Result Value Ref Range   Glucose-Capillary 138 (*) 70 - 99 mg/dL      Physical Exam  Constitutional: He is oriented to person,  place, and time. He appears well-developed.  HENT:  Right facial droop  Eyes: EOM are normal.  Neck: Normal range of motion. Neck supple. No thyromegaly present.  Cardiovascular: Normal rate and regular rhythm.  Respiratory: Effort normal and breath sounds normal. No respiratory distress.  GI: Soft. Bowel sounds are normal. He exhibits no distension.  Neurological: He is alert and oriented to person, place, and time.  Speech is dysarthric but intelligible. He does follow simple commands. Fair awareness of his deficits. RUE 2/5 prox to distal, inconsistent.. Has difficulty with attention and sequencing. Had no overt weakness or FMC issues in the left arm and leg. ?visual acuity inconsistent.  Skin: Skin is warm and dry.  Psychiatric:emotional lability   Assessment/Plan: 1. 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 Physiatrist is providing close team supervision and 24 hour management of active medical problems listed below. Physiatrist and rehab team continue to assess barriers to discharge/monitor patient progress toward functional and medical goals. FIM: FIM - Bathing Bathing Steps  Patient Completed: Chest;Right Arm;Abdomen;Front perineal area;Buttocks;Right upper leg;Left upper leg;Right lower leg (including foot);Left lower leg (including foot) Bathing: 4: Min-Patient completes 8-9 26f 10 parts or 75+ percent  FIM - Upper Body Dressing/Undressing Upper body dressing/undressing steps patient completed: Thread/unthread right sleeve of front closure shirt/dress;Thread/unthread left sleeve of front closure shirt/dress;Pull shirt around back of front closure shirt/dress Upper body dressing/undressing: 4: Min-Patient completed 75 plus % of tasks FIM - Lower Body Dressing/Undressing Lower body dressing/undressing steps patient completed: Thread/unthread right underwear leg;Thread/unthread left underwear leg;Pull underwear up/down;Thread/unthread right pants leg;Thread/unthread left pants leg;Pull pants up/down Lower body dressing/undressing: 4: Min-Patient completed 75 plus % of tasks  FIM - Toileting Toileting: 0: Activity did not occur     FIM - Control and instrumentation engineer Devices: Arm rests Bed/Chair Transfer: 4: Bed > Chair or W/C: Min A (steadying Pt. > 75%);4: Chair or W/C > Bed: Min A (steadying Pt. > 75%);5: Supine > Sit: Supervision (verbal cues/safety issues);5: Sit > Supine: Supervision (verbal cues/safety issues)  FIM - Locomotion: Wheelchair Distance: 150'x2 Locomotion: Wheelchair: 5: Travels 150 ft or more: maneuvers on rugs and over door sills with supervision, cueing or coaxing FIM - Locomotion: Ambulation Locomotion: Ambulation Assistive Devices: Other (comment);Walker - Rolling (and HHA) Ambulation/Gait Assistance: 3: Mod assist Locomotion: Ambulation: 1: Travels less than 50 ft with moderate assistance (Pt: 50 - 74%)  Comprehension Comprehension Mode: Auditory Comprehension: 4-Understands basic 75 - 89% of the time/requires cueing 10 - 24% of the time  Expression Expression Mode: Verbal Expression: 4-Expresses basic 75 - 89% of  the time/requires cueing 10 - 24% of the time. Needs helper to occlude trach/needs to repeat words.  Social Interaction Social Interaction: 4-Interacts appropriately 75 - 89% of the time - Needs redirection for appropriate language or to initiate interaction.  Problem Solving Problem Solving: 4-Solves basic 75 - 89% of the time/requires cueing 10 - 24% of the time  Memory Memory: 4-Recognizes or recalls 75 - 89% of the time/requires cueing 10 - 24% of the time  Medical Problem List and Plan:  1. 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 DVT Prophylaxis/Anticoagulation: Subcutaneous heparin. Monitor platelet counts any signs of bleeding  3. Pain Management: Ultram as needed. Monitor with increased  mobility  4. Diabetes mellitus with peripheral neuropathy. Hemoglobin A1c 11.3. NovoLog 4 units 3 times a day, Lantus insulin 20 units daily. Check CBGs a.c. and at bedtime. Well controlled at present 5. Neuropsych: This patient is not capable of making decisions on his own behalf.  6. Skin/Wound Care: Routine skin checks  7. Fluids/Electrolytes/Nutrition: Strict I and O.'s. Followup chemistries. Provide nutritional supplements as needed  8. Hyperlipidemia. Lipitor  9. Poor medical compliance due to financial constraints. Follow financial counselor   LOS (Days) [place] EVALUATION WAS PERFORMED  KIRSTEINS,ANDREW E 05/09/2014, 8:20 AM

## 2014-05-09 NOTE — Progress Notes (Signed)
Occupational Therapy Session Note  Patient Details  Name: Adam Callahan MRN: [id number] Date of Birth: [birth year]  Today's Date: 05/09/2014 OT Individual Time: 1300-1333 OT Individual Time Calculation (min): 33 min    Short Term Goals: Week 1:  OT Short Term Goal 1 (Week 1): Pt performed bathing with supervison sit to stand. OT Short Term Goal 2 (Week 1): Pt will perform all dressing with supervision. OT Short Term Goal 3 (Week 1): Pt will perform toilet transfers with supervision to elevated toilet with rail vs 3:1. OT Short Term Goal 4 (Week 1): Pt will perform self AAROM exercises for the LUE with supervision following handout. OT Short Term Goal 5 (Week 1): Pt will perform walk-in shower transfers with supervision using LRAD.  Skilled Therapeutic Interventions/Progress Updates:    Therapist rolled pt down to the therapy gym for session.  He performed stand pivot transfer to the mat with min assist stand pivot.  Applied NMES to the right forearm to help stimulate digit extension and help reduce flexor tone.  Used preset program for atrophy with level 34 intensity for 10 mins.  Program interval was set for 10 seconds on and then 10 seconds off.  While stimulation was active had pt work on forward reaching to target while not hiking shoulder with min facilitation.  Also placed the right hand on the mat and had pt work on small movements of elbow extension while attempting to keep the right hand in contact with weightbearing surface.  Pt with no adverse reactions to therapy stimulation.  Returned to room via wheelchair at end of session.   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

## 2014-05-09 NOTE — Progress Notes (Signed)
Physical Therapy Session Note  Patient Details  Name: Adam Callahan MRN: [id number] Date of Birth: [birth year]  Today's Date: 05/09/2014 PT Individual Time: 1115-1200 PT Individual Time Calculation (min): 45 min   Short Term Goals: Week 1:  PT Short Term Goal 1 (Week 1): = LTGs due to anticipated LOS  Skilled Therapeutic Interventions/Progress Updates:   Pt received sitting in w/c, ready for therapy. W/c mobility using L hemi technique 2 x 150 ft and supervision, continues to require cues for technique to use L hand instead of rocking trunk forward to propel w/c with momentum. Session focused on R LE NMR, increased RLE WB, and increased weight shift to R with sit <> stand from edge of mat with manual facilitation for hip hinge with anterior weight shift, foot placement, weight shift to R, and decreasing trunk rotation. Patient also performed RLE NMR in standing with back against standing frame for feedback/UE support and lifting L foot off ground. Core activation and trunk shortening/elongation exercises with lifting LUE overhead/LLE hip flexion off ground in sitting without leaning posteriorly. Greatly improved RLE muscle activation noted throughout session. Pt with limited carryover of technique for sit <> stand at end of session. Pt unable to recall room number and requires total A for path finding to return to room. Pt left sitting in w/c with RN tech present.   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

## 2014-05-09 NOTE — Progress Notes (Signed)
Occupational Therapy Session Note  Patient Details  Name: Adam Callahan MRN: [id number] Date of Birth: 1961/01/09  Today's Date: 05/09/2014 OT Individual Time: 6283-1517 OT Individual Time Calculation (min): 45 min    Short Term Goals: Week 1:  OT Short Term Goal 1 (Week 1): Pt performed bathing with supervison sit to stand. OT Short Term Goal 2 (Week 1): Pt will perform all dressing with supervision. OT Short Term Goal 3 (Week 1): Pt will perform toilet transfers with supervision to elevated toilet with rail vs 3:1. OT Short Term Goal 4 (Week 1): Pt will perform self AAROM exercises for the LUE with supervision following handout. OT Short Term Goal 5 (Week 1): Pt will perform walk-in shower transfers with supervision using LRAD.  Skilled Therapeutic Interventions/Progress Updates:    Pt began session by ambulating to the shower using the RW.  Mod demonstrational cueing to place the RUE on the walker and min assist to complete the transfer.  He was able to perform all bathing with overall min assist.  Provided mod hand over hand assist for pt to wash the LUE with the right hand but also demonstrated hemi techniques for washing the LUE as well.  Pt able to return demonstrate with mod instructional cueing.  He was able to ambulate back out to the wheelchair for dressing with min assist but needed mod demonstrational cueing for using the RW correctly.  Increased flexor tone noted in the RUE and hand as well.  He was able to perform all dressing with min guard assist except for donning TEDs and tying shoes.  Therapist provided shoe buttons however the strings had been untied.  Pt very appreciative and thankful for therapist helping him.   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

## 2014-05-09 NOTE — Progress Notes (Signed)
Speech Language Pathology Daily Session Note  Patient Details  Name: Adam Callahan MRN: [id number] Date of Birth: 04/04/1961  Today's Date: 05/09/2014 SLP Individual Time: 0930-1030 SLP Individual Time Calculation (min): 60 min  Short Term Goals: Week 1: SLP Short Term Goal 1 (Week 1): Pt will improve functional problem solving during basic self care tasks for 75% accuracy with min assist.  SLP Short Term Goal 2 (Week 1): Pt will recall at least 3 compensatory strategies for memory to facilitate improved carryover of daily information.  SLP Short Term Goal 3 (Week 1): Pt will improve topic maintenance during functional conversations with no more than 3 cues needed for redirection.  SLP Short Term Goal 4 (Week 1): Pt will improve sustained attention during functional tasks for 5-7 minutes with min assist.    Skilled Therapeutic Interventions: Skilled treatment session focused on addressing cognitive goals.  SLP facilitated session with medication management task and Max-Totat assist for recall of medications, functions and frequency.  Patient reported that reading was difficult at baseline and he relied on his memory; however, repetition and association were not effective strategies to facilitate carryover following brief delays.  Patient verbalized that he will just have to make himself concentrate and read better before he goes home.  SLP initiated a discussion regarding need for 24/7 Supervision following discharge which appeared to upset patient.  Session was ended with Max assist multimodal cues during route finding back to the patient's room.  Continue with current plan of care.     FIM:  Comprehension Comprehension Mode: Auditory Comprehension: 4-Understands basic 75 - 89% of the time/requires cueing 10 - 24% of the time Expression Expression Mode: Verbal Expression: 4-Expresses basic 75 - 89% of the time/requires cueing 10 - 24% of the time. Needs helper to occlude trach/needs to  repeat words. Social Interaction Social Interaction: 3-Interacts appropriately 50 - 74% of the time - May be physically or verbally inappropriate. Problem Solving Problem Solving: 3-Solves basic 50 - 74% of the time/requires cueing 25 - 49% of the time Memory Memory: 3-Recognizes or recalls 50 - 74% of the time/requires cueing 25 - 49% of the time  Pain Pain Assessment Pain Assessment: No/denies pain  Therapy/Group: Individual Therapy  Carmelia Roller., [place] [phone]  [place] 05/09/2014, 1:23 PM

## 2014-05-10 ENCOUNTER — Inpatient Hospital Stay (HOSPITAL_COMMUNITY): Payer: BC Managed Care – PPO

## 2014-05-10 ENCOUNTER — Inpatient Hospital Stay (HOSPITAL_COMMUNITY): Payer: BC Managed Care – PPO | Admitting: Physical Therapy

## 2014-05-10 ENCOUNTER — Inpatient Hospital Stay (HOSPITAL_COMMUNITY): Payer: BC Managed Care – PPO | Admitting: Speech Pathology

## 2014-05-10 LAB — GLUCOSE, CAPILLARY
GLUCOSE-CAPILLARY: 74 mg/dL (ref 70–99)
GLUCOSE-CAPILLARY: 155 mg/dL — AB (ref 70–99)
Glucose-Capillary: 109 mg/dL — ABNORMAL HIGH (ref 70–99)
Glucose-Capillary: 79 mg/dL (ref 70–99)

## 2014-05-10 NOTE — Progress Notes (Signed)
Occupational Therapy Session Note  Patient Details  Name: Adam Callahan MRN: [id number] Date of Birth: Aug 07, 1960  Today's Date: 05/10/2014 OT Individual Time: 6761-9509 OT Individual Time Calculation (min): 45 min    Short Term Goals: Week 1:  OT Short Term Goal 1 (Week 1): Pt performed bathing with supervison sit to stand. OT Short Term Goal 2 (Week 1): Pt will perform all dressing with supervision. OT Short Term Goal 3 (Week 1): Pt will perform toilet transfers with supervision to elevated toilet with rail vs 3:1. OT Short Term Goal 4 (Week 1): Pt will perform self AAROM exercises for the LUE with supervision following handout. OT Short Term Goal 5 (Week 1): Pt will perform walk-in shower transfers with supervision using LRAD.  Skilled Therapeutic Interventions/Progress Updates: ADL-retraining with emphasis on NMR of R-UE, AE training (wash mit for right hand, RW right hand grip orthosis), functional mobility using RW, transfers, and dynamic standing balance.   Pt received seated in w/c, right shoulder internally rotated and right elbow flexed (consistent with [place] level 3).   OT re-educated pt on SROM and minimizing/reducing compensatory movement patterns (elevating shoulder, left lateral leans while raising right hand).    Pt then ambulated to shower with min assist (steadying) required while using RW.   Pt transferred to bench with only min guard assist and performed seated shower with left hand (80%) and with right hand (20%) after receiving wash mitt for right hand.   Pt required re-ed on hemi dressing and struggled with donning shirt d/t size of shirt fitting too tightly.   OT educated pt on modifying clothing choices to simplify dressing, ex: wearing extra large shirts and shorts.    Pt required mod-max assist for lower body dressing d/t time limitations although attempting dressing with carry-over of hemi dressing skills.   Pt required total assist to don shoes while seated in  w/c.   Pt left in w/c with safety belt affixed and call light within reach.    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

## 2014-05-10 NOTE — Progress Notes (Signed)
Speech Language Pathology Daily Session Note  Patient Details  Name: Adam Callahan MRN: [id number] Date of Birth: Oct 07, 1960  Today's Date: 05/10/2014 SLP Individual Time: 0930-1030 SLP Individual Time Calculation (min): 60 min  Short Term Goals: Week 1: SLP Short Term Goal 1 (Week 1): Pt will improve functional problem solving during basic self care tasks for 75% accuracy with min assist.  SLP Short Term Goal 2 (Week 1): Pt will recall at least 3 compensatory strategies for memory to facilitate improved carryover of daily information.  SLP Short Term Goal 3 (Week 1): Pt will improve topic maintenance during functional conversations with no more than 3 cues needed for redirection.  SLP Short Term Goal 4 (Week 1): Pt will improve sustained attention during functional tasks for 5-7 minutes with min assist.    Skilled Therapeutic Interventions: Skilled treatment session focused on addressing cognitive-linguistic goals.  Student facilitated session by providing Min A multimodal cues for orientation to situation and supervision question cues for recall of discharge planning discussed in previous speech session. Patient demonstrated perseveration in regard to events from prior stroke in 2014 and required Min A multimodal cues for redirection back to topic about most recent stroke. Student also facilitated session by providing Mod-Max A multimodal cues for sustained attention and problem solving during a basic money management task. Patient verbalized difficulty with vision in right eye. Session was ended with Mod A multimodal cues during route finding back to the patient's room and Min-Mod A multimodal cues for utilization of call bell to request assistance and remote to turn on the television. Continue with current plan of care.    FIM:  Comprehension Comprehension Mode: Auditory Comprehension: 3-Understands basic 50 - 74% of the time/requires cueing 25 - 50%  of the time Expression Expression  Mode: Verbal Expression: 4-Expresses basic 75 - 89% of the time/requires cueing 10 - 24% of the time. Needs helper to occlude trach/needs to repeat words. Social Interaction Social Interaction: 4-Interacts appropriately 75 - 89% of the time - Needs redirection for appropriate language or to initiate interaction. Problem Solving Problem Solving: 3-Solves basic 50 - 74% of the time/requires cueing 25 - 49% of the time Memory Memory: 3-Recognizes or recalls 50 - 74% of the time/requires cueing 25 - 49% of the time FIM - Eating Eating Activity: 5: Set-up assist for open containers  Pain Pain Assessment Pain Assessment: No/denies pain  Therapy/Group: Individual Therapy  [place] 05/10/2014, 12:03 PM

## 2014-05-10 NOTE — Plan of Care (Signed)
Problem: RH Stairs Goal: LTG Patient will ambulate up and down stairs w/assist (PT) LTG: Patient will ambulate up and down # of stairs with assistance (PT)  Goal changed to more accurately reflect home entry at d/c

## 2014-05-10 NOTE — Progress Notes (Signed)
The skilled treatment note has been reviewed and SLP is in agreement. Dontel Harshberger, M.A., CCC-SLP [phone]  

## 2014-05-10 NOTE — Progress Notes (Signed)
Physical Therapy Session Note  Patient Details  Name: Adam Callahan MRN: [id number] Date of Birth: [birth year]  Today's Date: 05/10/2014 PT Individual Time: 1130-1205 and 1400-1445 PT Individual Time Calculation (min): 35 min and 45 min  Short Term Goals: Week 1:  PT Short Term Goal 1 (Week 1): = LTGs due to anticipated LOS  Skilled Therapeutic Interventions/Progress Updates:   AM Session: Session focused on NMR, bed mobility, transfers, and gait. Pt performed w/c mobility using L hemi technique, verbal cues for technique, 2 x 150 ft with supervision. Pt performed sit <> stand with max cues for sequencing and technique to increase RLE WB with verbal cues for foot placement and shifting to R with transfer. Pt performed bed mobility on flat bed in ADL apartment with cues for sequencing to increase efficiency of movement/energy conservation. Pt with no carryover noted following training. Gait training with unilateral HHA with R toe off AFO and swedish knee cage to reduce knee hyperextension in stance phase, 2 x 50-75 ft with min A and max verbal cues for sequencing, therapist facilitating weight shift to R for weight acceptance. Pt unable to sequence side stepping or retro stepping in order to back up to w/c despite max verbal/tactile/visual cuing. Pt returned to room and left sitting in w/c with all needs within reach, RN tech present.   PM Session: Pt received sitting in w/c, second ex-wife Adam Callahan present for family training. Adam Callahan reporting that patient's d/c plan is to go to his house and [place] providing 24/7 assist. Pt's ex-wife also reporting that patient has not been walking for past year and every time she has seen him, he has been in the w/c. Following ambulation, Adam Callahan reports that patient is currently walking better than at baseline and stated, "Something was wrong with his (right) leg before, it was just dangling," and "Why is his arm like that?" Pt and family education provided regarding  impairments due to previous CVA and how stroke affects brain function that controls arm and leg. Hands-on family training initiated for gait training x 50 ft and x 90 ft with R HHA with R AFO and knee cage and min-mod A, how to instruct patient in rolling for bed mobility for increased efficiency, and how to instruct patient in setup for sit <> stand to facilitate RLE WB with transfer and in standing. Adam Callahan reports patient has one 5" step after driveway and two 5" steps to get into house with no rails. She reports it would be difficult to have rails installed. Pt negotiated up/down three 5" steps with L HHA and mod A, requires total cues for sequencing. When attempting stairs with HHA from ex-wife, pt with increased LOB and therapist stepping in to safely return patient to seated position. Pt left sitting in w/c to propel back to room with ex-wife. Continue family training.   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

## 2014-05-10 NOTE — Progress Notes (Signed)
Subjective/Complaints:  Slept ok, no pain c/os Notes abnl sensation in RLE Significant expressive aphasia Review of systems cannot obtain. Accurate reports since he has poor awareness and global cognitive deficit Objective: Vital Signs: Blood pressure 138/71, pulse 78, temperature 98 F (36.7 C), temperature source Oral, resp. rate 18, weight 75.932 kg (167 lb 6.4 oz), SpO2 100.00%. No results found. Results for orders placed during the hospital encounter of 05/04/14 (from the past 72 hour(s))  GLUCOSE, CAPILLARY     Status: Abnormal   Collection Time    05/07/14 11:46 AM      Result Value Ref Range   Glucose-Capillary 124 (*) 70 - 99 mg/dL   Comment 1 Notify RN    GLUCOSE, CAPILLARY     Status: Abnormal   Collection Time    05/07/14  5:07 PM      Result Value Ref Range   Glucose-Capillary 100 (*) 70 - 99 mg/dL   Comment 1 Notify RN    GLUCOSE, CAPILLARY     Status: Abnormal   Collection Time    05/07/14  8:35 PM      Result Value Ref Range   Glucose-Capillary 196 (*) 70 - 99 mg/dL   Comment 1 Notify RN    GLUCOSE, CAPILLARY     Status: Abnormal   Collection Time    05/08/14  6:46 AM      Result Value Ref Range   Glucose-Capillary 160 (*) 70 - 99 mg/dL   Comment 1 Notify RN    GLUCOSE, CAPILLARY     Status: None   Collection Time    05/08/14 12:04 PM      Result Value Ref Range   Glucose-Capillary 87  70 - 99 mg/dL   Comment 1 Notify RN    GLUCOSE, CAPILLARY     Status: Abnormal   Collection Time    05/08/14  4:44 PM      Result Value Ref Range   Glucose-Capillary 140 (*) 70 - 99 mg/dL   Comment 1 Notify RN    GLUCOSE, CAPILLARY     Status: Abnormal   Collection Time    05/08/14  8:31 PM      Result Value Ref Range   Glucose-Capillary 134 (*) 70 - 99 mg/dL  GLUCOSE, CAPILLARY     Status: Abnormal   Collection Time    05/09/14  7:29 AM      Result Value Ref Range   Glucose-Capillary 138 (*) 70 - 99 mg/dL  GLUCOSE, CAPILLARY     Status: None   Collection  Time    05/09/14 12:03 PM      Result Value Ref Range   Glucose-Capillary 92  70 - 99 mg/dL  GLUCOSE, CAPILLARY     Status: Abnormal   Collection Time    05/09/14  5:08 PM      Result Value Ref Range   Glucose-Capillary 121 (*) 70 - 99 mg/dL  GLUCOSE, CAPILLARY     Status: Abnormal   Collection Time    05/09/14  9:10 PM      Result Value Ref Range   Glucose-Capillary 111 (*) 70 - 99 mg/dL  GLUCOSE, CAPILLARY     Status: Abnormal   Collection Time    05/10/14  6:57 AM      Result Value Ref Range   Glucose-Capillary 109 (*) 70 - 99 mg/dL      Physical Exam  Constitutional: He is oriented to person, place, and time. He appears well-developed.  HENT:  Right facial droop  Eyes: EOM are normal.  Neck: Normal range of motion. Neck supple. No thyromegaly present.  Cardiovascular: Normal rate and regular rhythm.  Respiratory: Effort normal and breath sounds normal. No respiratory distress.  GI: Soft. Bowel sounds are normal. He exhibits no distension.  Neurological: He is alert and oriented to person, place, and time.  Speech is dysarthric but intelligible. He does follow simple commands. Fair awareness of his deficits. RUE 2/5 prox to distal, inconsistent.. Has difficulty with attention and sequencing. Had no overt weakness or FMC issues in the left arm and leg. ?visual acuity inconsistent. Decreased sensory on Right side Skin: Skin is warm and dry.  Psychiatric:emotional lability   Assessment/Plan: 1. 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 Physiatrist is providing close team supervision and 24 hour management of active medical problems listed below. Physiatrist and rehab team continue to assess barriers to discharge/monitor patient progress toward functional and medical goals. FIM: FIM -  Bathing Bathing Steps Patient Completed: Chest;Right Arm;Abdomen;Front perineal area;Buttocks;Right upper leg;Left upper leg;Right lower leg (including foot);Left lower leg (including foot) Bathing: 4: Min-Patient completes 8-9 30f 10 parts or 75+ percent  FIM - Upper Body Dressing/Undressing Upper body dressing/undressing steps patient completed: Thread/unthread right sleeve of pullover shirt/dresss;Thread/unthread left sleeve of pullover shirt/dress;Put head through opening of pull over shirt/dress;Pull shirt over trunk Upper body dressing/undressing: 5: Supervision: Safety issues/verbal cues FIM - Lower Body Dressing/Undressing Lower body dressing/undressing steps patient completed: Thread/unthread right underwear leg;Thread/unthread left underwear leg;Pull underwear up/down;Thread/unthread right pants leg;Thread/unthread left pants leg;Pull pants up/down;Don/Doff right shoe;Don/Doff left shoe Lower body dressing/undressing: 4: Min-Patient completed 75 plus % of tasks  FIM - Toileting Toileting: 0: Activity did not occur     FIM - Control and instrumentation engineer Devices: Arm rests Bed/Chair Transfer: 4: Bed > Chair or W/C: Min A (steadying Pt. > 75%);4: Chair or W/C > Bed: Min A (steadying Pt. > 75%)  FIM - Locomotion: Wheelchair Distance: 150 Locomotion: Wheelchair: 5: Travels 150 ft or more: maneuvers on rugs and over door sills with supervision, cueing or coaxing FIM - Locomotion: Ambulation Locomotion: Ambulation Assistive Devices: Other (comment) (HHA) Ambulation/Gait Assistance: 3: Mod assist Locomotion: Ambulation: 1: Travels less than 50 ft with moderate assistance (Pt: 50 - 74%)  Comprehension Comprehension Mode: Auditory Comprehension: 4-Understands basic 75 - 89% of the time/requires cueing 10 - 24% of the time  Expression Expression Mode: Verbal Expression: 4-Expresses basic 75 - 89% of the time/requires cueing 10 - 24% of the time. Needs helper to  occlude trach/needs to repeat words.  Social Interaction Social Interaction: 3-Interacts appropriately 50 - 74% of the time - May be physically or verbally inappropriate.  Problem Solving Problem Solving: 3-Solves basic 50 - 74% of the time/requires cueing 25 - 49% of the time  Memory Memory: 3-Recognizes or recalls 50 - 74% of the time/requires cueing 25 - 49% of the time  Medical Problem List and Plan:  1. 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 DVT Prophylaxis/Anticoagulation: Subcutaneous heparin. Monitor platelet counts any signs of bleeding  3. Pain Management: Ultram as needed. Monitor with increased mobility  4. Diabetes mellitus with peripheral neuropathy. Hemoglobin A1c 11.3. NovoLog 4 units 3 times a day, Lantus insulin 20  units daily. Check CBGs a.c. and at bedtime. Well controlled at present 5. Neuropsych: This patient is not capable of making decisions on his own behalf.  6. Skin/Wound Care: Routine skin checks  7. Fluids/Electrolytes/Nutrition: Strict I and O.'s. Followup chemistries. Provide nutritional supplements as needed  8. Hyperlipidemia. Lipitor  9. Poor medical compliance due to financial constraints. Follow financial counselor   LOS (Days) [place] E 05/10/2014, 8:35 AM

## 2014-05-10 NOTE — Plan of Care (Signed)
Problem: RH BOWEL ELIMINATION Goal: RH STG MANAGE BOWEL WITH ASSISTANCE STG Manage Bowel with Assistance. Mod I  Outcome: Progressing No report of incontinent episode     

## 2014-05-10 NOTE — Plan of Care (Signed)
Problem: RH SKIN INTEGRITY Goal: RH STG SKIN FREE OF INFECTION/BREAKDOWN No skin breakdown this admission  Outcome: Progressing No report of skin breakdown

## 2014-05-10 NOTE — Plan of Care (Signed)
Problem: RH PAIN MANAGEMENT Goal: RH STG PAIN MANAGED AT OR BELOW PT'S PAIN GOAL Less than or equal to 2  Outcome: Progressing No c/o pain

## 2014-05-11 ENCOUNTER — Inpatient Hospital Stay (HOSPITAL_COMMUNITY): Payer: BC Managed Care – PPO | Admitting: Speech Pathology

## 2014-05-11 ENCOUNTER — Inpatient Hospital Stay (HOSPITAL_COMMUNITY): Payer: BC Managed Care – PPO | Admitting: Physical Therapy

## 2014-05-11 ENCOUNTER — Inpatient Hospital Stay (HOSPITAL_COMMUNITY): Payer: BC Managed Care – PPO | Admitting: Occupational Therapy

## 2014-05-11 LAB — GLUCOSE, CAPILLARY
GLUCOSE-CAPILLARY: 132 mg/dL — AB (ref 70–99)
GLUCOSE-CAPILLARY: 96 mg/dL (ref 70–99)
Glucose-Capillary: 177 mg/dL — ABNORMAL HIGH (ref 70–99)
Glucose-Capillary: 77 mg/dL (ref 70–99)

## 2014-05-11 NOTE — Progress Notes (Signed)
Social Work [name], male   DOB: Jan 01, 1961, 53 y.o.   MRN: [id number] Spoke with pt's daughter regarding team conference and change in plans.  She is not happy with this and expressed [name] is up to no good and wants to take advantage of her Dad. She is aware of his plan to sty here and have [name] provide assist.  She plans to come on Sunday and get POA so no one but she can access his accounts, she does not want him to be  \taken advantage of like last time with his aunt.  She states: " [name] has wormed her way in, she heard he will be getting a check."  Pt is aware of daughter's distrust of [name], but continues to want her  To assist him at home.  Daughter aware of pt's discharge date and care needs.  Will work toward a safe discharge plan.

## 2014-05-11 NOTE — Progress Notes (Signed)
Social Work [name], male   DOB: 1961-04-21, 53 y.o.   MRN: [id number] Met with pt and spoke with [name]-ex-wife via telephone to discuss team conference goals of supervision/min level and discharge 10/27. Most of his deficits are from his first CVA last year.  He is glad to be going home and staying here in [place].  He loves his daughter but doesn't want To go to [place].  Will contact daughter to inform of the plan.  Pt will discuss with [name] OP versus Home health therapies, has had both. [name] to come back in from more Education either Friday or Monday.

## 2014-05-11 NOTE — Progress Notes (Signed)
Occupational Therapy Session Note  Patient Details  Name: Adam Callahan MRN: [id number] Date of Birth: 1960-09-19  Today's Date: 05/11/2014 OT Individual Time: 9675-9163 OT Individual Time Calculation (min): 60 min    Short Term Goals: Week 1:  OT Short Term Goal 1 (Week 1): Pt performed bathing with supervison sit to stand. OT Short Term Goal 2 (Week 1): Pt will perform all dressing with supervision. OT Short Term Goal 3 (Week 1): Pt will perform toilet transfers with supervision to elevated toilet with rail vs 3:1. OT Short Term Goal 4 (Week 1): Pt will perform self AAROM exercises for the LUE with supervision following handout. OT Short Term Goal 5 (Week 1): Pt will perform walk-in shower transfers with supervision using LRAD.  Skilled Therapeutic Interventions/Progress Updates:  Upon entering the room, pt supine in bed with no c/o pain this session. Supine > sit with Min A to EOB. OT session with focus on ADL retraining,functional use of R UE, safety awareness, functional mobility, and patient education. During session, pt repeated several times, same conversation,  concerns regarding discharge situation from hospital. Pt required assistance to insert and remove R hand from orthosis adhered to RW. Pt ambulated to toilet and TTB with steady assist. Toileting with Mod A this session as pt required assist for hygiene. Transfer onto TTB with RW and steady assist. Bathing with min A to wash buttocks and L UE for thoroughness. Bath mitt donned and doffed by therapist with pt utilizing to assist in washing chest, abdomen, and L UE. Pt requiring verbal cues to utilize. Pt seated in chair with quick release belt donned at end of session for safety. Food set up for pt to begin eating breakfast as therapist exited the room. Call bell and all other needed items within reach.    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

## 2014-05-11 NOTE — Progress Notes (Addendum)
Subjective/Complaints: Pt remains aphasic Pt without new issues Review of systems cannot obtain. Accurate reports since he has poor awareness and global cognitive deficit Objective: Vital Signs: Blood pressure 126/86, pulse 84, temperature 97.6 F (36.4 C), temperature source Oral, resp. rate 18, weight 75.932 kg (167 lb 6.4 oz), SpO2 99.00%. No results found. Results for orders placed during the hospital encounter of 05/04/14 (from the past 72 hour(s))  GLUCOSE, CAPILLARY     Status: None   Collection Time    05/08/14 12:04 PM      Result Value Ref Range   Glucose-Capillary 87  70 - 99 mg/dL   Comment 1 Notify RN    GLUCOSE, CAPILLARY     Status: Abnormal   Collection Time    05/08/14  4:44 PM      Result Value Ref Range   Glucose-Capillary 140 (*) 70 - 99 mg/dL   Comment 1 Notify RN    GLUCOSE, CAPILLARY     Status: Abnormal   Collection Time    05/08/14  8:31 PM      Result Value Ref Range   Glucose-Capillary 134 (*) 70 - 99 mg/dL  GLUCOSE, CAPILLARY     Status: Abnormal   Collection Time    05/09/14  7:29 AM      Result Value Ref Range   Glucose-Capillary 138 (*) 70 - 99 mg/dL  GLUCOSE, CAPILLARY     Status: None   Collection Time    05/09/14 12:03 PM      Result Value Ref Range   Glucose-Capillary 92  70 - 99 mg/dL  GLUCOSE, CAPILLARY     Status: Abnormal   Collection Time    05/09/14  5:08 PM      Result Value Ref Range   Glucose-Capillary 121 (*) 70 - 99 mg/dL  GLUCOSE, CAPILLARY     Status: Abnormal   Collection Time    05/09/14  9:10 PM      Result Value Ref Range   Glucose-Capillary 111 (*) 70 - 99 mg/dL  GLUCOSE, CAPILLARY     Status: Abnormal   Collection Time    05/10/14  6:57 AM      Result Value Ref Range   Glucose-Capillary 109 (*) 70 - 99 mg/dL  GLUCOSE, CAPILLARY     Status: None   Collection Time    05/10/14 12:05 PM      Result Value Ref Range   Glucose-Capillary 74  70 - 99 mg/dL  GLUCOSE, CAPILLARY     Status: None   Collection Time     05/10/14  4:38 PM      Result Value Ref Range   Glucose-Capillary 79  70 - 99 mg/dL  GLUCOSE, CAPILLARY     Status: Abnormal   Collection Time    05/10/14  9:17 PM      Result Value Ref Range   Glucose-Capillary 155 (*) 70 - 99 mg/dL  GLUCOSE, CAPILLARY     Status: Abnormal   Collection Time    05/11/14  7:41 AM      Result Value Ref Range   Glucose-Capillary 132 (*) 70 - 99 mg/dL   Comment 1 Notify RN        Physical Exam  Constitutional: He is oriented to person, place, and time. He appears well-developed.  HENT:  Right facial droop  Eyes: EOM are normal.  Neck: Normal range of motion. Neck supple. No thyromegaly present.  Cardiovascular: Normal rate and regular rhythm.  Respiratory:  Effort normal and breath sounds normal. No respiratory distress.  GI: Soft. Bowel sounds are normal. He exhibits no distension.  Neurological: He is alert and oriented to person, place, and time.  Speech is dysarthric but intelligible. He does follow simple commands. Fair awareness of his deficits. RUE 2/5 prox to distal, inconsistent.. Has difficulty with attention and sequencing. Had no overt weakness or FMC issues in the left arm and leg. ?visual acuity inconsistent. Decreased sensory on Right side Skin: Skin is warm and dry.  Psychiatric:emotional lability   Assessment/Plan: 1. 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 Physiatrist is providing close team supervision and 24 hour management of active medical problems listed below. Physiatrist and rehab team continue to assess barriers to discharge/monitor patient progress toward functional and medical goals. Team conference today please see physician documentation under team conference tab, met with team face-to-face to discuss problems,progress, and goals.  Formulized individual treatment plan based on medical history, underlying problem and comorbidities. FIM: FIM - Bathing Bathing Steps Patient Completed: Chest;Right Arm;Abdomen;Front perineal area;Right upper leg;Left upper leg;Right lower leg (including foot);Left lower leg (including foot) Bathing: 4: Min-Patient completes 8-9 30f10 parts or 75+ percent  FIM - Upper Body Dressing/Undressing Upper body dressing/undressing steps patient completed: Thread/unthread right sleeve of pullover shirt/dresss;Thread/unthread left sleeve of pullover shirt/dress Upper body dressing/undressing: 3: Mod-Patient completed 50-74% of tasks FIM - Lower Body Dressing/Undressing Lower body dressing/undressing steps patient completed: Thread/unthread right underwear leg;Thread/unthread left underwear leg;Thread/unthread right pants leg;Thread/unthread left pants leg Lower body dressing/undressing: 3: Mod-Patient completed 50-74% of tasks  FIM - Toileting Toileting: 1: Total-Patient completed zero steps, helper did all 3 (per [employer])  FIM - Toilet Transfers Toilet Transfers: 2-To toilet/BSC: Max A (lift and lower assist);2-From toilet/BSC: Max A (lift and lower assist) (per [employer])  FIM - Bed/Chair Transfer Bed/Chair Transfer Assistive Devices: Arm rests Bed/Chair Transfer: 4: Bed > Chair or W/C: Min A (steadying Pt. > 75%);4: Chair or W/C > Bed: Min A (steadying Pt. > 75%);5: Sit > Supine: Supervision (verbal cues/safety issues);5: Supine > Sit: Supervision (verbal cues/safety issues)  FIM - Locomotion: Wheelchair Distance: 150 Locomotion: Wheelchair: 5: Travels 150 ft or more: maneuvers on rugs and over door sills with supervision, cueing or coaxing FIM - Locomotion: Ambulation Locomotion: Ambulation Assistive Devices: Other (comment) (HHA) Ambulation/Gait Assistance: 4: Min assist Locomotion: Ambulation: 2: Travels 50 - 149 ft with minimal assistance  (Pt.>75%)  Comprehension Comprehension Mode: Auditory Comprehension: 3-Understands basic 50 - 74% of the time/requires cueing 25 - 50%  of the time  Expression Expression Mode: Verbal Expression: 4-Expresses basic 75 - 89% of the time/requires cueing 10 - 24% of the time. Needs helper to occlude trach/needs to repeat words.  Social Interaction Social Interaction: 4-Interacts appropriately 75 - 89% of the time - Needs redirection for appropriate language or to initiate interaction.  Problem Solving Problem Solving: 3-Solves basic 50 - 74% of the time/requires cueing 25 - 49% of the time  Memory Memory: 3-Recognizes or recalls 50 - 74% of the time/requires cueing 25 - 49% of the time  Medical Problem List and Plan:  1. 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 DVT Prophylaxis/Anticoagulation: Subcutaneous heparin. Monitor platelet counts any signs of bleeding  3.  Pain Management: Ultram as needed. Monitor with increased mobility  4. Diabetes mellitus with peripheral neuropathy. Hemoglobin A1c 11.3. NovoLog 4 units 3 times a day, Lantus insulin 20 units daily. Check CBGs a.c. and at bedtime. Well controlled at present 5. Neuropsych: This patient is not capable of making decisions on his own behalf.  6. Skin/Wound Care: Routine skin checks  7. Fluids/Electrolytes/Nutrition: Strict I and O.'s. Followup chemistries. Provide nutritional supplements as needed  8. Hyperlipidemia. Lipitor  9. Poor medical compliance due to financial constraints. Follow financial counselor   LOS (Days) [place] E 05/11/2014, 8:17 AM

## 2014-05-11 NOTE — Progress Notes (Signed)
Speech Language Pathology Daily Session Note  Patient Details  Name: Adam Callahan MRN: [id number] Date of Birth: [birth year]  Today's Date: 05/11/2014 SLP Individual Time: 1400-1500 SLP Individual Time Calculation (min): 60 min  Short Term Goals: Week 1: SLP Short Term Goal 1 (Week 1): Pt will improve functional problem solving during basic self care tasks for 75% accuracy with min assist.  SLP Short Term Goal 2 (Week 1): Pt will recall at least 3 compensatory strategies for memory to facilitate improved carryover of daily information.  SLP Short Term Goal 3 (Week 1): Pt will improve topic maintenance during functional conversations with no more than 3 cues needed for redirection.  SLP Short Term Goal 4 (Week 1): Pt will improve sustained attention during functional tasks for 5-7 minutes with min assist.    Skilled Therapeutic Interventions: Skilled treatment session focused on addressing cognitive-linguistic goals.  Student facilitated session by providing Min A multimodal cues for during route finding to kitchen and Mod-Max A multimodal cues for recall, problem solving, safety awareness and sustained attention during a functional basic task of making grits. Patient demonstrated limited mental flexibility to task and perseverated intermittently throughout session about being a picky eater. Student also facilitated session by providing Mod-Max A semantic and phonemic cues during a confrontational naming task in which patient spontaneously named 3 of 10 items. Patient demonstrated intellectual awareness into difficulty of task and asked why it was so hard to name some items. Patient also required Max A multimodal cues for problem solving and sustained attention during a visual scanning task given a field of eight and decreased to Mod A multimodal cues when field was reduced to three. Session was ended with Mod A multimodal cues during route finding back to the patient's room. Continue with current  plan of care.   FIM:  Comprehension Comprehension Mode: Auditory Comprehension: 3-Understands basic 50 - 74% of the time/requires cueing 25 - 50%  of the time Expression Expression Mode: Verbal Expression: 3-Expresses basic 50 - 74% of the time/requires cueing 25 - 50% of the time. Needs to repeat parts of sentences. Social Interaction Social Interaction: 3-Interacts appropriately 50 - 74% of the time - May be physically or verbally inappropriate. Problem Solving Problem Solving: 3-Solves basic 50 - 74% of the time/requires cueing 25 - 49% of the time Memory Memory: 3-Recognizes or recalls 50 - 74% of the time/requires cueing 25 - 49% of the time  Pain Pain Assessment Pain Assessment: No/denies pain  Therapy/Group: Individual Therapy  Dayanis Bergquist 05/11/2014, 4:10 PM

## 2014-05-11 NOTE — Progress Notes (Signed)
The skilled treatment note has been reviewed and SLP is in agreement. Tu Bayle, M.A., CCC-SLP [phone]  

## 2014-05-11 NOTE — Patient Care Conference (Signed)
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 Acute left hemiparesis 05/04/2014  . Stroke 04/30/2014  . Spastic hemiplegia affecting dominant side 10/01/2013  . Aphasia as late effect of cerebrovascular accident 10/01/2013  . 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 Dehydration 06/07/2013  . Hypokalemia 06/07/2013  . Dyslipidemia 06/07/2013  . CVA (cerebral infarction) 06/06/2013  . Diabetes mellitus type 2, uncontrolled 06/06/2013  . Rhabdomyolysis 06/06/2013    Expected Discharge Date: Expected Discharge Date: 05/17/14  Team Members Present: Physician leading conference: Dr. Alysia Penna Social Worker Present: Ovidio Kin, LCSW Nurse Present: Heather Roberts, RN PT Present: Raylene Everts, PT;Dennys Traughber Varner, Jimmie Molly, PT OT Present: Willeen Cass, Rhetta Mura, OT SLP Present: Gunnar Fusi, SLP PPS Coordinator present : Daiva Nakayama, RN, CRRN     Current Status/Progress Goal Weekly Team Focus  Medical   severe cog def, acute on chronic  home d/c  d/c planning   Bowel/Bladder   Continent of bowel and bladder. LBM 05/07/14  Pt to remain continent of bowel and bladder  Monitor   Swallow/Nutrition/ Hydration     regular diet        ADL's   Min A for transfers onto toilet and tub bench,  Min A bathing, Mod A UB and LB dressing, Mod A toileting  S overall, Min A to use R UE at gross assist level, Mod I dynamic sitting balance  functional transfers, patient education, memory/recall, self care retraining   Mobility   S bed mobility and w/c propulsion, min A transfers, min-mod A gait with HHA  supervision overall, downgraded to min A overall except supervision bed mob and transfers based on family report of baseline  R sided NMR, transfers, bed mobility, patient education, memory/recall   Communication     [facility]        Safety/Cognition/ Behavioral Observations  Mod-Max assist   Min assist; downgraded to some Mod assist   increase safety awareness and requests for help as needed as well as very basic carryover   Pain   No c/o pain  <3  Assess for nonverbal cues of pain   Skin   Scratch to R forearm with Allevyn dressing intact  No additional skin breakdown  Remind pt to turn q 2hrs      *See Care Plan and progress notes for long and short-term goals.  Barriers to Discharge: didn't have 24/7 pre admit    Possible Resolutions to Barriers:       Discharge Planning/Teaching Needs:  Now home with ex-wife [name] providing care along with Ron Agee.  He wants to stay local and [name] has committed to providing care and coming in to learn.      Team Conference: Deficits are from CVA last year-poor recall, memory and safety issues.  Recommendation  is 24 hr care.  Plan to downgrade goals to min level. AFO and Knee cage ordered.  Need more family education  Revisions to Treatment Plan:  Downgraded goals to min level   Continued Need for Acute Rehabilitation Level of Care: The patient requires daily medical management by a physician with specialized training in physical medicine and rehabilitation for the following conditions: Daily direction of a multidisciplinary physical rehabilitation program to ensure safe treatment while eliciting the highest outcome that is of practical  value to the patient.: Yes Daily medical management of patient stability for increased activity during participation in an intensive rehabilitation regime.: Yes Daily analysis of laboratory values and/or radiology reports with any subsequent need for medication adjustment of medical intervention for : Neurological problems  Kinzly Pierrelouis, Gardiner Rhyme 05/11/2014, 2:55 PM

## 2014-05-11 NOTE — Progress Notes (Signed)
Physical Therapy Session Note  Patient Details  Name: Adam Callahan MRN: [id number] Date of Birth: [birth year]  Today's Date: 05/11/2014 PT Individual Time: 2409-7353 and 1535-1615 PT Individual Time Calculation (min): 45 min and 40 min  Short Term Goals: Week 1:  PT Short Term Goal 1 (Week 1): = LTGs due to anticipated LOS  Skilled Therapeutic Interventions/Progress Updates:   AM Session: Pt received sitting in bedside chair, ready for therapy. Session focused on gait training with orthotist and stair negotiation. Therapist donned pt's shoes and R AFO/[place] knee cage due to time constraints. Pt performed stand pivot transfer bed > w/c with min A. Pt able to propel w/c using L hemi technique without verbal cues to decrease use of trunk for momentum for first time, 2 x 150 ft with supervision. Pt requires verbal cues for RLE WB with sit <> stand, to not use LUE on armrest, and to weight shift to R with transfer, supervision. Gait training with R HHA and min-mod A for balance and therapist facilitating weight shifting to R, 3 x 50-100 ft in controlled environment. Chris from [employer] [facility] [employer] present for assessment of gait with final determination to utilize [employer] with knee cage to control knee hyperextension and sturdier AFO due to tone with medial strut due to pt tendency for eversion. Pt negotiated up/down 3 stairs x 2 with R HHA only to facilitate home entry, mod A and verbal cues for sequencing. Pt returned to room and left sitting in w/c with quick release belt on and all needs within reach.   PM Session: Focus on hands-on family training with ex-wife [name] (to be primary caregiver). Performed w/c propulsion via L hemi technique with supervision x 200 ft, gait training with wife providing min R HHA multiple bouts x 50-75 ft, bed mobility to R and L on flat bed in ADL apartment with wife providing cues for sequencing rolling and requires assist from therapist to  facilitate correct technique, overall supervision. Pt negotiated up/down curb step with L HHA from therapist x 4 with mod A and max cues for sequencing. Pt then negotiated up/down curb step x2 with wife providing L HHA, mod A, and able to perform without cues for sequencing. Pt left sitting in w/c to return to room with wife.   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, confusion Restrictions Weight Bearing Restrictions: No Pain: Pain Assessment Pain Assessment: No/denies pain Pain Score: 0-No pain  See FIM for current functional status  Therapy/Group: Individual Therapy  Laretta Alstrom 05/11/2014, 11:24 AM

## 2014-05-12 ENCOUNTER — Inpatient Hospital Stay (HOSPITAL_COMMUNITY): Payer: BC Managed Care – PPO | Admitting: *Deleted

## 2014-05-12 ENCOUNTER — Inpatient Hospital Stay (HOSPITAL_COMMUNITY): Payer: BC Managed Care – PPO | Admitting: Occupational Therapy

## 2014-05-12 ENCOUNTER — Inpatient Hospital Stay (HOSPITAL_COMMUNITY): Payer: BC Managed Care – PPO | Admitting: Speech Pathology

## 2014-05-12 LAB — GLUCOSE, CAPILLARY
GLUCOSE-CAPILLARY: 117 mg/dL — AB (ref 70–99)
GLUCOSE-CAPILLARY: 147 mg/dL — AB (ref 70–99)
Glucose-Capillary: 147 mg/dL — ABNORMAL HIGH (ref 70–99)
Glucose-Capillary: 121 mg/dL — ABNORMAL HIGH (ref 70–99)

## 2014-05-12 NOTE — Progress Notes (Signed)
The skilled treatment and weekly notes have been reviewed and SLP is in agreement. Gunnar Fusi, M.A., CCC-SLP [phone]

## 2014-05-12 NOTE — Progress Notes (Signed)
Speech Language Pathology Weekly Progress and Session Note  Patient Details  Name: Adam Callahan MRN: [id number] Date of Birth: 10/17/1960  Beginning of progress report period: May 04, 2014 End of progress report period: May 12, 2014  Today's Date: 05/12/2014 SLP Individual Time: 0930-1000 SLP Individual Time Calculation (min): 30 min  Short Term Goals: Week 1: SLP Short Term Goal 1 (Week 1): Pt will improve functional problem solving during basic self care tasks for 75% accuracy with min assist.  SLP Short Term Goal 1 - Progress (Week 1): Progressing toward goal SLP Short Term Goal 2 (Week 1): Pt will recall at least 3 compensatory strategies for memory to facilitate improved carryover of daily information.  SLP Short Term Goal 2 - Progress (Week 1): Progressing toward goal SLP Short Term Goal 3 (Week 1): Pt will improve topic maintenance during functional conversations with no more than 3 cues needed for redirection.  SLP Short Term Goal 3 - Progress (Week 1): Progressing toward goal SLP Short Term Goal 4 (Week 1): Pt will improve sustained attention during functional tasks for 5-7 minutes with min assist.   SLP Short Term Goal 4 - Progress (Week 1): Met    New Short Term Goals: Week 2: SLP Short Term Goal 1 (Week 2): Pt will demonstrate functional problem solving for basic and familiar tasks with Min A multimodal cues.  SLP Short Term Goal 2 (Week 2): Pt will utilize memory compensatory strategies to recall and facilitate improved carryover of daily information with Mod A multimodal cues.  SLP Short Term Goal 3 (Week 2): Pt will improve topic maintenance during functional conversations with no more than 3 cues needed for redirection.  SLP Short Term Goal 4 (Week 2): Pt will improve sustained attention during functional tasks for 10-15 minutes with min assist.    Weekly Progress Updates: Patient has made minimal gains and has met 1 of 4 STG's this reporting period due to  increased sustained attention. Currently, patient requires Mod-Max A multimodal cues for problem solving during basic self-care tasks and recall of new, daily information. Patient also requires Mod A multimodal cues for topic maintenance and redirection back to tasks. Patient/family education ongoing and patient would benefit from continued skilled SLP intervention to maximize his cognitive-linguistic function and overall functional independence prior to discharge home with 24 hour supervision.     Intensity: Minumum of 1-2 x/day, 30 to 90 minutes Frequency: 5 out of 7 days Duration/Length of Stay: 05-17-14 Treatment/Interventions: Cognitive remediation/compensation;Cueing hierarchy;Internal/external aids;Patient/family education;Environmental controls;Functional tasks   Daily Session  Skilled Therapeutic Interventions:  Skilled treatment session focused on cognitive-linguistic goals. Student facilitated session with Max A multimodal cues for orientation to date and recall of previous therapy sessions. Student also facilitated session by providing Mod A multimodal cues for problem solving during a visual scanning task given a field of three and increasing to field of eight. Session was ended with Mod A multimodal cues during route finding back to the patient's room and patient required Max A multimodal cues for utilization of call bell. Continue with current plan of care.        FIM:  Comprehension Comprehension Mode: Auditory Comprehension: 3-Understands basic 50 - 74% of the time/requires cueing 25 - 50%  of the time Expression Expression Mode: Verbal Expression: 3-Expresses basic 50 - 74% of the time/requires cueing 25 - 50% of the time. Needs to repeat parts of sentences. Social Interaction Social Interaction: 4-Interacts appropriately 75 - 89% of the time - Needs  redirection for appropriate language or to initiate interaction. Problem Solving Problem Solving: 3-Solves basic 50 - 74% of  the time/requires cueing 25 - 49% of the time Memory Memory: 4-Recognizes or recalls 75 - 89% of the time/requires cueing 10 - 24% of the time FIM - Eating Eating Activity: 5: Set-up assist for cut food General    Pain Pain Assessment Pain Assessment: No/denies pain  Therapy/Group: Individual Therapy  Adam Callahan 05/12/2014, 4:02 PM

## 2014-05-12 NOTE — Progress Notes (Signed)
Occupational Therapy Session Note  Patient Details  Name: Adam Callahan MRN: [id number] Date of Birth: April 14, 1961  Today's Date: 05/12/2014 OT Individual Time: 0800-0900 OT Individual Time Calculation (min): 60 min    Short Term Goals: Week 1:  OT Short Term Goal 1 (Week 1): Pt performed bathing with supervison sit to stand. OT Short Term Goal 2 (Week 1): Pt will perform all dressing with supervision. OT Short Term Goal 3 (Week 1): Pt will perform toilet transfers with supervision to elevated toilet with rail vs 3:1. OT Short Term Goal 4 (Week 1): Pt will perform self AAROM exercises for the LUE with supervision following handout. OT Short Term Goal 5 (Week 1): Pt will perform walk-in shower transfers with supervision using LRAD.  Skilled Therapeutic Interventions/Progress Updates:    Pt transferred via ambulation to the walk-in shower with min assist.  Pt with noted compensatory gait with right trunk elongation, leaning to the left and right hip hiking with attempted advancement of the RLE.  Trunk flexion noted when attempting to step with the LLE and maintain stance phase.  Therapist helping to facilitate but pt with increased trunk and hip tightness on the right.  He was able to perform all bathing with min assist and mod instructional cueing to sequence.  Pt used wash mit on the right hand to help with washing the left arm, but needed mod assist to complete.  Dressing sit to stand at the sink as well.  Supervision for UB dressing with pt needing mod instructional cueing to follow hemi techniques.  Mod assist needed for LB dressing including fastening pants, donning TEDs, and donning shoes and right AFO.   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 Restrictions Weight Bearing Restrictions: No  Pain: Pain Assessment Pain Assessment: No/denies pain ADL: See FIM for current functional status  Therapy/Group:  Individual Therapy  Kaylanie Capili OTR/L 05/12/2014, 9:00 AM

## 2014-05-12 NOTE — Progress Notes (Signed)
Physical Therapy Weekly Progress Note  Patient Details  Name: Adam Callahan MRN: [id number] Date of Birth: [birth year]  Beginning of progress report period: May 05, 2014 End of progress report period: May 12, 2014  Today's Date: 05/12/2014 PT Concurrent Time: 1300-1430 PT Concurrent Time Calculation (min): 90 min  Patient has met 3 of 7 long term goals.  Short term goals not set due to estimated length of stay. Family training initiated with ex-wife Adam Callahan) who reports she will be able to provide 24/7 assist at discharge. Continue family training. Pt with limited carryover for functional mobility due to chronicity of condition, learned non-use of RUE/LE, and cognitive impairments. Pt at supervision level for w/c mobility and bed mobility with cues for rolling, min A for stand pivot transfers, sit <> stand with supervision and verbal cues for R LE foot placement, weight shift to R, and not using LUE for increased R LE NMR, gait with R HHA and min-mod A, stairs with L HHA and mod A.   Patient continues to demonstrate the following deficits: chronic R UE and LE spastic hemiplegia, cognitive impairments, decreased balance, R inattention, decreased safety awareness and therefore will continue to benefit from skilled PT intervention to enhance overall performance with activity tolerance, balance, postural control, ability to compensate for deficits, functional use of  right upper extremity and right lower extremity, attention, awareness and coordination.  See Patient's Care Plan for progression toward long term goals. Patient not progressing toward long term goals.  See goal revision.  Plan of care revisions: Goals downgraded to overall min A due to family present to report baseline function/chronicity of impairments.   Skilled Therapeutic Interventions/Progress Updates:    Pt participated in community outing to address functional use of RUE, R LE NMR with transfers, w/c mobility, van  transfer, and gait x 150 ft at supervision-min A level. See outing goal sheet and shadow chart for full details.  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 Restrictions Weight Bearing Restrictions: No Pain: Pain Assessment Pain Assessment: No/denies pain  See FIM for current functional status  Therapy/Group: Concurrent therapy  Laretta Alstrom 05/12/2014, 4:18 PM

## 2014-05-12 NOTE — Progress Notes (Signed)
Orthopedic Tech Progress Note Patient Details:  Adam Callahan Jan 13, 1961 [id number] Called in order to [facility]. Patient ID: LOGHAN KURTZMAN, male   DOB: 21-Jul-1961, 53 y.o.   MRN: [id number]   Darrol Poke 05/12/2014, 11:51 AM

## 2014-05-12 NOTE — Progress Notes (Signed)
Social Work [name], male   DOB: 08/17/1960, 53 y.o.   MRN: [id number] Met with pt to discuss follow up therapies, he wants to do OP since he did this last time.  Will make referral for OP.

## 2014-05-12 NOTE — Progress Notes (Signed)
Subjective/Complaints: Pt remains aphasic,l felt hot last noc but no fever Pt without new issues Review of systems cannot obtain. Accurate reports since he has poor awareness and global cognitive deficit Objective: Vital Signs: Blood pressure 146/91, pulse 93, temperature 98.3 F (36.8 C), temperature source Oral, resp. rate 18, weight 75.932 kg (167 lb 6.4 oz), SpO2 98.00%. No results found. Results for orders placed during the hospital encounter of 05/04/14 (from the past 72 hour(s))  GLUCOSE, CAPILLARY     Status: None   Collection Time    05/09/14 12:03 PM      Result Value Ref Range   Glucose-Capillary 92  70 - 99 mg/dL  GLUCOSE, CAPILLARY     Status: Abnormal   Collection Time    05/09/14  5:08 PM      Result Value Ref Range   Glucose-Capillary 121 (*) 70 - 99 mg/dL  GLUCOSE, CAPILLARY     Status: Abnormal   Collection Time    05/09/14  9:10 PM      Result Value Ref Range   Glucose-Capillary 111 (*) 70 - 99 mg/dL  GLUCOSE, CAPILLARY     Status: Abnormal   Collection Time    05/10/14  6:57 AM      Result Value Ref Range   Glucose-Capillary 109 (*) 70 - 99 mg/dL  GLUCOSE, CAPILLARY     Status: None   Collection Time    05/10/14 12:05 PM      Result Value Ref Range   Glucose-Capillary 74  70 - 99 mg/dL  GLUCOSE, CAPILLARY     Status: None   Collection Time    05/10/14  4:38 PM      Result Value Ref Range   Glucose-Capillary 79  70 - 99 mg/dL  GLUCOSE, CAPILLARY     Status: Abnormal   Collection Time    05/10/14  9:17 PM      Result Value Ref Range   Glucose-Capillary 155 (*) 70 - 99 mg/dL  GLUCOSE, CAPILLARY     Status: Abnormal   Collection Time    05/11/14  7:41 AM      Result Value Ref Range   Glucose-Capillary 132 (*) 70 - 99 mg/dL   Comment 1 Notify RN    GLUCOSE, CAPILLARY     Status: None   Collection Time    05/11/14 12:00 PM      Result Value Ref Range   Glucose-Capillary 96  70 - 99 mg/dL  GLUCOSE, CAPILLARY     Status: Abnormal   Collection  Time    05/11/14  4:59 PM      Result Value Ref Range   Glucose-Capillary 177 (*) 70 - 99 mg/dL  GLUCOSE, CAPILLARY     Status: None   Collection Time    05/11/14  8:59 PM      Result Value Ref Range   Glucose-Capillary 77  70 - 99 mg/dL   Comment 1 Notify RN    GLUCOSE, CAPILLARY     Status: Abnormal   Collection Time    05/12/14  7:03 AM      Result Value Ref Range   Glucose-Capillary 147 (*) 70 - 99 mg/dL   Comment 1 Notify RN        Physical Exam  Constitutional: He is oriented to person, place, and time. He appears well-developed.  HENT:  Right facial droop  Eyes: EOM are normal.  Neck: Normal range of motion. Neck supple. No thyromegaly present.  Cardiovascular:  Normal rate and regular rhythm.  Respiratory: Effort normal and breath sounds normal. No respiratory distress.  GI: Soft. Bowel sounds are normal. He exhibits no distension.  Neurological: He is alert and oriented to person, place, and time.  Speech is dysarthric but intelligible. He does follow simple commands. Fair awareness of his deficits. RUE 2/5 prox to distal, inconsistent.. Has difficulty with attention and sequencing. Had no overt weakness or FMC issues in the left arm and leg. ?visual acuity inconsistent. Decreased sensory on Right side Skin: Skin is warm and dry.  Psychiatric:emotional lability   Assessment/Plan: 1. 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 Physiatrist is providing close team supervision and 24 hour management of active medical problems listed below. Physiatrist and rehab team continue to assess barriers to discharge/monitor patient progress toward functional and medical goals.  FIM: FIM - Bathing Bathing Steps Patient Completed: Chest;Right Arm;Abdomen;Front perineal area;Right upper leg;Left upper  leg;Left lower leg (including foot);Right lower leg (including foot) Bathing: 4: Min-Patient completes 8-9 55f 10 parts or 75+ percent  FIM - Upper Body Dressing/Undressing Upper body dressing/undressing steps patient completed: Thread/unthread right sleeve of front closure shirt/dress;Thread/unthread left sleeve of front closure shirt/dress Upper body dressing/undressing: 3: Mod-Patient completed 50-74% of tasks FIM - Lower Body Dressing/Undressing Lower body dressing/undressing steps patient completed: Thread/unthread right underwear leg;Thread/unthread left underwear leg;Thread/unthread right pants leg;Thread/unthread left pants leg;Don/Doff right sock;Don/Doff left sock Lower body dressing/undressing: 3: Mod-Patient completed 50-74% of tasks  FIM - Toileting Toileting steps completed by patient: Adjust clothing prior to toileting;Adjust clothing after toileting Toileting: 3: Mod-Patient completed 2 of 3 steps  FIM - Radio producer Devices: Insurance account manager Transfers: 4-To toilet/BSC: Min A (steadying Pt. > 75%);4-From toilet/BSC: Min A (steadying Pt. > 75%)  FIM - Bed/Chair Transfer Bed/Chair Transfer Assistive Devices: Arm rests Bed/Chair Transfer: 4: Bed > Chair or W/C: Min A (steadying Pt. > 75%);4: Chair or W/C > Bed: Min A (steadying Pt. > 75%)  FIM - Locomotion: Wheelchair Distance: 150 Locomotion: Wheelchair: 5: Travels 150 ft or more: maneuvers on rugs and over door sills with supervision, cueing or coaxing FIM - Locomotion: Ambulation Locomotion: Ambulation Assistive Devices: Other (comment) (R HHA) Ambulation/Gait Assistance: 4: Min assist;3: Mod assist Locomotion: Ambulation: 2: Travels 50 - 149 ft with moderate assistance (Pt: 50 - 74%)  Comprehension Comprehension Mode: Auditory Comprehension: 3-Understands basic 50 - 74% of the time/requires cueing 25 - 50%  of the time  Expression Expression Mode: Verbal Expression: 3-Expresses basic 50  - 74% of the time/requires cueing 25 - 50% of the time. Needs to repeat parts of sentences.  Social Interaction Social Interaction: 3-Interacts appropriately 50 - 74% of the time - May be physically or verbally inappropriate.  Problem Solving Problem Solving: 3-Solves basic 50 - 74% of the time/requires cueing 25 - 49% of the time  Memory Memory: 3-Recognizes or recalls 50 - 74% of the time/requires cueing 25 - 49% of the time  Medical Problem List and Plan:  1. 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 DVT Prophylaxis/Anticoagulation: Subcutaneous heparin. Monitor platelet counts any signs of bleeding  3. Pain Management: Ultram as needed. Monitor with increased mobility  4. Diabetes mellitus with peripheral neuropathy. Hemoglobin A1c 11.3. NovoLog 4 units 3  times a day, Lantus insulin 20 units daily. Check CBGs a.c. and at bedtime. Well controlled at present 5. Neuropsych: This patient is not capable of making decisions on his own behalf.  6. Skin/Wound Care: Routine skin checks  7. Fluids/Electrolytes/Nutrition: Strict I and O.'s. Followup chemistries. Provide nutritional supplements as needed  8. Hyperlipidemia. Lipitor  9. Poor medical compliance due to financial constraints. Follow financial counselor   LOS (Days) Sheridan E 05/12/2014, 8:45 AM

## 2014-05-13 ENCOUNTER — Inpatient Hospital Stay (HOSPITAL_COMMUNITY): Payer: BC Managed Care – PPO | Admitting: Speech Pathology

## 2014-05-13 ENCOUNTER — Inpatient Hospital Stay (HOSPITAL_COMMUNITY): Payer: BC Managed Care – PPO | Admitting: Physical Therapy

## 2014-05-13 ENCOUNTER — Encounter (HOSPITAL_COMMUNITY): Payer: Self-pay | Admitting: Occupational Therapy

## 2014-05-13 LAB — GLUCOSE, CAPILLARY
GLUCOSE-CAPILLARY: 144 mg/dL — AB (ref 70–99)
Glucose-Capillary: 94 mg/dL (ref 70–99)
Glucose-Capillary: 127 mg/dL — ABNORMAL HIGH (ref 70–99)
Glucose-Capillary: 200 mg/dL — ABNORMAL HIGH (ref 70–99)

## 2014-05-13 NOTE — Progress Notes (Signed)
Subjective/Complaints: Right face feels warm/numb (no change) Pt without new issues Review of systems cannot obtain. Accurate reports since he has poor awareness and global cognitive deficit  Objective: Vital Signs: Blood pressure 138/66, pulse 80, temperature 98.3 F (36.8 C), temperature source Oral, resp. rate 18, weight 75.932 kg (167 lb 6.4 oz), SpO2 100.00%. No results found. Results for orders placed during the hospital encounter of 05/04/14 (from the past 72 hour(s))  GLUCOSE, CAPILLARY     Status: None   Collection Time    05/10/14 12:05 PM      Result Value Ref Range   Glucose-Capillary 74  70 - 99 mg/dL  GLUCOSE, CAPILLARY     Status: None   Collection Time    05/10/14  4:38 PM      Result Value Ref Range   Glucose-Capillary 79  70 - 99 mg/dL  GLUCOSE, CAPILLARY     Status: Abnormal   Collection Time    05/10/14  9:17 PM      Result Value Ref Range   Glucose-Capillary 155 (*) 70 - 99 mg/dL  GLUCOSE, CAPILLARY     Status: Abnormal   Collection Time    05/11/14  7:41 AM      Result Value Ref Range   Glucose-Capillary 132 (*) 70 - 99 mg/dL   Comment 1 Notify RN    GLUCOSE, CAPILLARY     Status: None   Collection Time    05/11/14 12:00 PM      Result Value Ref Range   Glucose-Capillary 96  70 - 99 mg/dL  GLUCOSE, CAPILLARY     Status: Abnormal   Collection Time    05/11/14  4:59 PM      Result Value Ref Range   Glucose-Capillary 177 (*) 70 - 99 mg/dL  GLUCOSE, CAPILLARY     Status: None   Collection Time    05/11/14  8:59 PM      Result Value Ref Range   Glucose-Capillary 77  70 - 99 mg/dL   Comment 1 Notify RN    GLUCOSE, CAPILLARY     Status: Abnormal   Collection Time    05/12/14  7:03 AM      Result Value Ref Range   Glucose-Capillary 147 (*) 70 - 99 mg/dL   Comment 1 Notify RN    GLUCOSE, CAPILLARY     Status: Abnormal   Collection Time    05/12/14 11:20 AM      Result Value Ref Range   Glucose-Capillary 121 (*) 70 - 99 mg/dL  GLUCOSE,  CAPILLARY     Status: Abnormal   Collection Time    05/12/14  4:29 PM      Result Value Ref Range   Glucose-Capillary 117 (*) 70 - 99 mg/dL  GLUCOSE, CAPILLARY     Status: Abnormal   Collection Time    05/12/14  9:14 PM      Result Value Ref Range   Glucose-Capillary 147 (*) 70 - 99 mg/dL   Comment 1 Notify RN    GLUCOSE, CAPILLARY     Status: Abnormal   Collection Time    05/13/14  7:19 AM      Result Value Ref Range   Glucose-Capillary 144 (*) 70 - 99 mg/dL   Comment 1 Notify RN        Physical Exam  Constitutional: He is oriented to person, place, and time. He appears well-developed.  HENT:  Right facial droop  Eyes: EOM are normal.  Neck: Normal range of motion. Neck supple. No thyromegaly present.  Cardiovascular: Normal rate and regular rhythm.  Respiratory: Effort normal and breath sounds normal. No respiratory distress.  GI: Soft. Bowel sounds are normal. He exhibits no distension.  Neurological: He is alert and oriented to person, place, and time.  Speech is dysarthric but intelligible. He does follow simple commands. Fair awareness of his deficits. RUE 2/5 prox to distal.. Has difficulty with attention and sequencing. LUE and LLE 4-5/5.  Decreased sensory on Right face, arm, leg. Fair sitting balance Skin: Skin is warm and dry.  Psychiatric:emotional lability   Assessment/Plan: 1. 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 Physiatrist is providing close team supervision and 24 hour management of active medical problems listed below. Physiatrist and rehab team continue to assess barriers to discharge/monitor patient progress toward functional and medical goals.  FIM: FIM - Bathing Bathing Steps Patient Completed: Chest;Right Arm;Abdomen;Front perineal area;Right upper leg;Left upper  leg;Left lower leg (including foot);Right lower leg (including foot) Bathing: 4: Min-Patient completes 8-9 2f 10 parts or 75+ percent  FIM - Upper Body Dressing/Undressing Upper body dressing/undressing steps patient completed: Thread/unthread right sleeve of front closure shirt/dress;Thread/unthread left sleeve of front closure shirt/dress Upper body dressing/undressing: 5: Supervision: Safety issues/verbal cues FIM - Lower Body Dressing/Undressing Lower body dressing/undressing steps patient completed: Thread/unthread right underwear leg;Thread/unthread left underwear leg;Thread/unthread right pants leg;Thread/unthread left pants leg;Don/Doff right sock;Don/Doff left sock;Don/Doff left shoe Lower body dressing/undressing: 3: Mod-Patient completed 50-74% of tasks  FIM - Toileting Toileting steps completed by patient: Adjust clothing prior to toileting;Adjust clothing after toileting Toileting: 3: Mod-Patient completed 2 of 3 steps  FIM - Radio producer Devices: Insurance account manager Transfers: 4-To toilet/BSC: Min A (steadying Pt. > 75%);4-From toilet/BSC: Min A (steadying Pt. > 75%)  FIM - Bed/Chair Transfer Bed/Chair Transfer Assistive Devices: Arm rests Bed/Chair Transfer: 4: Chair or W/C > Bed: Min A (steadying Pt. > 75%);4: Bed > Chair or W/C: Min A (steadying Pt. > 75%)  FIM - Locomotion: Wheelchair Distance: 500 Locomotion: Wheelchair: 5: Travels 150 ft or more: maneuvers on rugs and over door sills with supervision, cueing or coaxing FIM - Locomotion: Ambulation Locomotion: Ambulation Assistive Devices: Other (comment) (R HHA) Ambulation/Gait Assistance: 4: Min assist Locomotion: Ambulation: 4: Travels 150 ft or more with minimal assistance (Pt.>75%)  Comprehension Comprehension Mode: Auditory Comprehension: 3-Understands basic 50 - 74% of the time/requires cueing 25 - 50%  of the time  Expression Expression Mode: Verbal Expression: 3-Expresses basic  50 - 74% of the time/requires cueing 25 - 50% of the time. Needs to repeat parts of sentences.  Social Interaction Social Interaction: 4-Interacts appropriately 75 - 89% of the time - Needs redirection for appropriate language or to initiate interaction.  Problem Solving Problem Solving: 3-Solves basic 50 - 74% of the time/requires cueing 25 - 49% of the time  Memory Memory: 4-Recognizes or recalls 75 - 89% of the time/requires cueing 10 - 24% of the time  Medical Problem List and Plan:  1. 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 DVT Prophylaxis/Anticoagulation: Subcutaneous heparin. Monitor platelet counts any signs of bleeding  3. Pain Management: Ultram as needed. Monitor with increased mobility  4. Diabetes mellitus with peripheral neuropathy. Hemoglobin A1c 11.3.  NovoLog 4 units 3 times a day, Lantus insulin 20 units daily.   -reasonable control at present 5. Neuropsych: This patient is not capable of making decisions on his own behalf.  6. Skin/Wound Care: Routine skin checks  7. Fluids/Electrolytes/Nutrition: Strict I and O.'s. Followup chemistries. Provide nutritional supplements as needed  8. Hyperlipidemia. Lipitor  9. Poor medical compliance due to financial constraints. Follow financial counselor   LOS (Days) [place] EVALUATION WAS PERFORMED  SWARTZ,ZACHARY T 05/13/2014, 8:52 AM

## 2014-05-13 NOTE — Progress Notes (Signed)
Physical Therapy Session Note  Patient Details  Name: Adam Callahan MRN: [id number] Date of Birth: 1961-06-22  Today's Date: 05/13/2014 PT Individual Time: 1507-1609 PT Individual Time Calculation (min): 62 min   Short Term Goals: Week 2:   STG's = LTG's secondary to anticipated LOS  Skilled Therapeutic Interventions/Progress Updates:   Pt received seated in w/c; agreeable to therapy. Per primary OT, orthotist planning to return to make modifications to KAFO, as pt continues to demonstrate R genu recurvatum during gait. Session planned to focus on hands-on family training; however, no family/caregivers present during initial 45 minutes of session. Therefore, removed R KAFO focused on NMR to address compensatory movement patterns during gait; see below for detailed description of NMR.   Pt's friend, "Adam Callahan", present for final 15 minutes of session. [name] friend reports he will providing hands-on assist at times upon D/C home. CSW Jacqlyn Larsen confirmed this. Placed heel wedge in R shoe to prevent genu recurvatum. Therapist explained, demonstrated the following: gait with R HHA and negotiation of 3 stairs without rails with L HHA requiring min-mod A. Friend gave effective return demonstration of gait with R HHA; however, will require reinforcement of assisting with stair negotiation. This PT recommended that friend return to future session for additional hands-on training. Friend verbally agreed. Session ended in pt room, where pt was left seated in w/c with quick release belt in place for safety and all needs within reach.  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 Restrictions Weight Bearing Restrictions: No Vital Signs: Therapy Vitals Temp: 98.2 F (36.8 C) Temp Source: Oral Pulse Rate: 83 Resp: 18 BP: 143/82 mmHg Patient Position (if appropriate): Sitting Oxygen Therapy SpO2: 98 % O2 Device: Not  Delivered Pain: Pain Assessment Pain Assessment: No/denies pain Locomotion : Ambulation Ambulation/Gait Assistance: 4: Min assist Wheelchair Mobility Distance: 200  NMR: Neuromuscular Facilitation: Lower Extremity;Activity to increase motor control;Activity to increase grading;Activity to increase lateral weight shifting;Activity to increase sustained activation RUE Weight Bearing Technique: High kneeling Pt performed the following in tall kneeling: forward walking to promote R hip flexion without R pelvic elevation; backwards walking to focus on selective control of R hip extension with knee flexion; LUE reaching across midline to increase lateral weight shift to R side. Multimodal cueing provided to promote bilat hip extension during all activities.  See FIM for current functional status  Therapy/Group: Individual Therapy  Stefano Gaul 05/13/2014, 4:38 PM

## 2014-05-13 NOTE — Progress Notes (Signed)
Speech Language Pathology Daily Session Note  Patient Details  Name: Adam Callahan MRN: [id number] Date of Birth: Nov 22, 1960  Today's Date: 05/13/2014 SLP Individual Time: 1000-1100 SLP Individual Time Calculation (min): 60 min  Short Term Goals: Week 2: SLP Short Term Goal 1 (Week 2): Pt will demonstrate functional problem solving for basic and familiar tasks with Min A multimodal cues.  SLP Short Term Goal 2 (Week 2): Pt will utilize memory compensatory strategies to recall and facilitate improved carryover of daily information with Mod A multimodal cues.  SLP Short Term Goal 3 (Week 2): Pt will improve topic maintenance during functional conversations with no more than 3 cues needed for redirection.  SLP Short Term Goal 4 (Week 2): Pt will improve sustained attention during functional tasks for 10-15 minutes with min assist.    Skilled Therapeutic Interventions: Skilled treatment session focused on addressing cognitive-linguistic goals.  SLP facilitated session by providing Max faded to Mod assist multimodal cues during a 4 step photo sequencing task.  Patient demonstrated limited mental flexibility while sequencing and perseverated on if and how he would do it at home.  SLP also facilitated session by providing Mod assist semantic and phonemic cues to describe what was happening in the photos.  Patient required Mod assist multimodal cues to route find back to his room at the end of the session. Continue with current plan of care.   FIM:  Comprehension Comprehension Mode: Auditory Comprehension: 3-Understands basic 50 - 74% of the time/requires cueing 25 - 50%  of the time Expression Expression Mode: Verbal Expression: 3-Expresses basic 50 - 74% of the time/requires cueing 25 - 50% of the time. Needs to repeat parts of sentences. Social Interaction Social Interaction: 4-Interacts appropriately 75 - 89% of the time - Needs redirection for appropriate language or to initiate  interaction. Problem Solving Problem Solving: 3-Solves basic 50 - 74% of the time/requires cueing 25 - 49% of the time Memory Memory: 2-Recognizes or recalls 25 - 49% of the time/requires cueing 51 - 75% of the time  Pain Pain Assessment Pain Assessment: No/denies pain  Therapy/Group: Individual Therapy  Carmelia Roller., [place] [phone]  [place] 05/13/2014, 12:35 PM

## 2014-05-13 NOTE — Progress Notes (Signed)
Occupational Therapy Weekly Progress Note  Patient Details  Name: Adam Callahan MRN: [id number] Date of Birth: 08-02-1960  Beginning of progress report period: May 05, 2014 End of progress report period: May 13, 2014  Today's Date: 05/13/2014 OT Individual Time: 1102-1204 OT Individual Time Calculation (min): 62 min    Patient has met 1 of 5 short term goals.  Pt continues to need min assist for mobility and functional transfers at this time.  Moderate RUE and RLE paresis from previous CVA with pt using compensatory strategies for transfers and mobility.  Still needs mod to max instructional cueing for recalling new techniques for sit to stand or for dressing.  He is not able to carryover these techniques from day to day.  Mod to max assist to integrate the LUE into functional tasks. Still feel he needs continued OT to progress toward established supervision goals.    Patient continues to demonstrate the following deficits: decreased balance, decreased RUE and RLE functional use, decreased memory,and therefore will continue to benefit from skilled OT intervention to enhance overall performance with BADL.  Patient progressing toward long term goals..  Continue plan of care.  OT Short Term Goals Week 2:  OT Short Term Goal 1 (Week 2): Pt performed bathing with supervison sit to stand. OT Short Term Goal 2 (Week 2): Pt will perform toileting sit to stand with supervision. OT Short Term Goal 3 (Week 2): Pt will perform shower transfers with min assist ambulating. OT Short Term Goal 4 (Week 2): Pt will perform LB dressing with min assist sit to stand. OT Short Term Goal 5 (Week 2): Pt will recall and demonstrate hemi technique for donning pullover shirt without verbal cueing. Week 3:     Skilled Therapeutic Interventions/Progress Updates:    Pt performed bathing and dressing shower level during session.  He was able to transfer to the shower with min hand held assist and  compensatory gait on the left side.  Mod instructional cueing for hemi washing techniques and for thoroughness as pt did not attempt to wash his LUE.  Mod assist using the wash mit on the RUE to wash the left arm.  Pt performed dressing sit to stand at the sink.  He needed mod instructional cueing to recall hemi techniques for donning pullover shirt.  Therapist assisted with donning the KAFO at end of session.   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

## 2014-05-14 ENCOUNTER — Inpatient Hospital Stay (HOSPITAL_COMMUNITY): Payer: BC Managed Care – PPO | Admitting: Occupational Therapy

## 2014-05-14 LAB — GLUCOSE, CAPILLARY
Glucose-Capillary: 117 mg/dL — ABNORMAL HIGH (ref 70–99)
Glucose-Capillary: 103 mg/dL — ABNORMAL HIGH (ref 70–99)
Glucose-Capillary: 143 mg/dL — ABNORMAL HIGH (ref 70–99)
Glucose-Capillary: 121 mg/dL — ABNORMAL HIGH (ref 70–99)

## 2014-05-14 NOTE — Progress Notes (Signed)
Patient's ex-wife educated on insulin administration, including drawing up the insulin. She returned demonstration. Kiyana Vazguez A. Tam Delisle, RN

## 2014-05-14 NOTE — Progress Notes (Signed)
Subjective/Complaints: Denies pain. Excited to be going home in a few days. Review of systems otherwise neg   Objective: Vital Signs: Blood pressure 152/77, pulse 97, temperature 98.2 F (36.8 C), temperature source Oral, resp. rate 18, weight 75.932 kg (167 lb 6.4 oz), SpO2 97.00%. No results found. Results for orders placed during the hospital encounter of 05/04/14 (from the past 72 hour(s))  GLUCOSE, CAPILLARY     Status: None   Collection Time    05/11/14 12:00 PM      Result Value Ref Range   Glucose-Capillary 96  70 - 99 mg/dL  GLUCOSE, CAPILLARY     Status: Abnormal   Collection Time    05/11/14  4:59 PM      Result Value Ref Range   Glucose-Capillary 177 (*) 70 - 99 mg/dL  GLUCOSE, CAPILLARY     Status: None   Collection Time    05/11/14  8:59 PM      Result Value Ref Range   Glucose-Capillary 77  70 - 99 mg/dL   Comment 1 Notify RN    GLUCOSE, CAPILLARY     Status: Abnormal   Collection Time    05/12/14  7:03 AM      Result Value Ref Range   Glucose-Capillary 147 (*) 70 - 99 mg/dL   Comment 1 Notify RN    GLUCOSE, CAPILLARY     Status: Abnormal   Collection Time    05/12/14 11:20 AM      Result Value Ref Range   Glucose-Capillary 121 (*) 70 - 99 mg/dL  GLUCOSE, CAPILLARY     Status: Abnormal   Collection Time    05/12/14  4:29 PM      Result Value Ref Range   Glucose-Capillary 117 (*) 70 - 99 mg/dL  GLUCOSE, CAPILLARY     Status: Abnormal   Collection Time    05/12/14  9:14 PM      Result Value Ref Range   Glucose-Capillary 147 (*) 70 - 99 mg/dL   Comment 1 Notify RN    GLUCOSE, CAPILLARY     Status: Abnormal   Collection Time    05/13/14  7:19 AM      Result Value Ref Range   Glucose-Capillary 144 (*) 70 - 99 mg/dL   Comment 1 Notify RN    GLUCOSE, CAPILLARY     Status: None   Collection Time    05/13/14 12:00 PM      Result Value Ref Range   Glucose-Capillary 94  70 - 99 mg/dL  GLUCOSE, CAPILLARY     Status: Abnormal   Collection Time   05/13/14  4:45 PM      Result Value Ref Range   Glucose-Capillary 127 (*) 70 - 99 mg/dL  GLUCOSE, CAPILLARY     Status: Abnormal   Collection Time    05/13/14  8:39 PM      Result Value Ref Range   Glucose-Capillary 200 (*) 70 - 99 mg/dL   Comment 1 Notify RN    GLUCOSE, CAPILLARY     Status: Abnormal   Collection Time    05/14/14  6:54 AM      Result Value Ref Range   Glucose-Capillary 117 (*) 70 - 99 mg/dL   Comment 1 Notify RN        Physical Exam  Constitutional: He is oriented to person, place, and time. He appears well-developed.  HENT:  Right facial droop  Eyes: EOM are normal.  Neck: Normal  range of motion. Neck supple. No thyromegaly present.  Cardiovascular: Normal rate and regular rhythm.  Respiratory: Effort normal and breath sounds normal. No respiratory distress.  GI: Soft. Bowel sounds are normal. He exhibits no distension.  Neurological: He is alert and oriented to person, place, and time.  Speech is dysarthric but intelligible. He does follow simple commands. Fair awareness of his deficits. RUE 2/5 prox to distal.. Has difficulty with attention and sequencing. LUE and LLE 4-5/5.  Decreased sensory to LT on Right face, arm, leg. Fair sitting balance Skin: Skin is warm and dry.  Psychiatric:emotional lability   Assessment/Plan: 1. 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 Physiatrist is providing close team supervision and 24 hour management of active medical problems listed below. Physiatrist and rehab team continue to assess barriers to discharge/monitor patient progress toward functional and medical goals.  FIM: FIM - Bathing Bathing Steps Patient Completed: Chest;Right Arm;Abdomen;Front perineal area;Right upper leg;Left upper leg;Left lower leg (including foot);Right lower leg  (including foot);Buttocks Bathing: 4: Min-Patient completes 8-9 59f 10 parts or 75+ percent  FIM - Upper Body Dressing/Undressing Upper body dressing/undressing steps patient completed: Thread/unthread left sleeve of pullover shirt/dress;Put head through opening of pull over shirt/dress;Thread/unthread right sleeve of pullover shirt/dresss;Pull shirt over trunk Upper body dressing/undressing: 5: Supervision: Safety issues/verbal cues FIM - Lower Body Dressing/Undressing Lower body dressing/undressing steps patient completed: Thread/unthread right pants leg;Thread/unthread left pants leg;Don/Doff left shoe;Fasten/unfasten left shoe Lower body dressing/undressing: 3: Mod-Patient completed 50-74% of tasks  FIM - Toileting Toileting steps completed by patient: Adjust clothing prior to toileting;Adjust clothing after toileting Toileting: 3: Mod-Patient completed 2 of 3 steps  FIM - Radio producer Devices: Insurance account manager Transfers: 4-To toilet/BSC: Min A (steadying Pt. > 75%);4-From toilet/BSC: Min A (steadying Pt. > 75%)  FIM - Bed/Chair Transfer Bed/Chair Transfer Assistive Devices: Arm rests;Orthosis Bed/Chair Transfer: 4: Chair or W/C > Bed: Min A (steadying Pt. > 75%);4: Bed > Chair or W/C: Min A (steadying Pt. > 75%)  FIM - Locomotion: Wheelchair Distance: 200 Locomotion: Wheelchair: 5: Travels 150 ft or more: maneuvers on rugs and over door sills with supervision, cueing or coaxing FIM - Locomotion: Ambulation Locomotion: Ambulation Assistive Devices: Other (comment);Orthosis (R HHA) Ambulation/Gait Assistance: 4: Min assist Locomotion: Ambulation: 4: Travels 150 ft or more with minimal assistance (Pt.>75%)  Comprehension Comprehension Mode: Auditory Comprehension: 3-Understands basic 50 - 74% of the time/requires cueing 25 - 50%  of the time  Expression Expression Mode: Verbal Expression: 3-Expresses basic 50 - 74% of the time/requires cueing 25 - 50%  of the time. Needs to repeat parts of sentences.  Social Interaction Social Interaction: 4-Interacts appropriately 75 - 89% of the time - Needs redirection for appropriate language or to initiate interaction.  Problem Solving Problem Solving: 3-Solves basic 50 - 74% of the time/requires cueing 25 - 49% of the time  Memory Memory: 2-Recognizes or recalls 25 - 49% of the time/requires cueing 51 - 75% of the time  Medical Problem List and Plan:  1. 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 DVT Prophylaxis/Anticoagulation: Subcutaneous heparin. Monitor platelet counts any signs of bleeding  3. Pain Management: Ultram as needed. Monitor with increased mobility  4. Diabetes mellitus with peripheral neuropathy. Hemoglobin A1c 11.3.  NovoLog 4 units 3 times a day, Lantus insulin 20 units daily.   -fair control at present 5. Neuropsych: This patient is not capable of making decisions on his own behalf.  6. Skin/Wound Care: Routine skin checks  7. Fluids/Electrolytes/Nutrition: Strict I and O.'s.  Provide nutritional supplements as needed  8. Hyperlipidemia. Lipitor  9. Poor medical compliance due to financial constraints. Follow financial counselor   LOS (Days) [place] PERFORMED  SWARTZ,ZACHARY T 05/14/2014, 8:19 AM

## 2014-05-14 NOTE — Progress Notes (Signed)
Occupational Therapy Session Note  Patient Details  Name: Adam Callahan MRN: [id number] Date of Birth: Oct 15, 1960  Today's Date: 05/14/2014 OT Individual Time: 1400-1430 OT Individual Time Calculation (min): 30 min    Short Term Goals: Week 1:  OT Short Term Goal 1 (Week 1): Pt performed bathing with supervison sit to stand. OT Short Term Goal 1 - Progress (Week 1): Not met OT Short Term Goal 2 (Week 1): Pt will perform all dressing with supervision. OT Short Term Goal 2 - Progress (Week 1): Not met OT Short Term Goal 3 (Week 1): Pt will perform toilet transfers with supervision to elevated toilet with rail vs 3:1. OT Short Term Goal 3 - Progress (Week 1): Not met OT Short Term Goal 4 (Week 1): Pt will perform self AAROM exercises for the LUE with supervision following handout. OT Short Term Goal 4 - Progress (Week 1): Met OT Short Term Goal 5 (Week 1): Pt will perform walk-in shower transfers with supervision using LRAD. OT Short Term Goal 5 - Progress (Week 1): Not met Week 2:  OT Short Term Goal 1 (Week 2): Pt performed bathing with supervison sit to stand. OT Short Term Goal 2 (Week 2): Pt will perform toileting sit to stand with supervision. OT Short Term Goal 3 (Week 2): Pt will perform shower transfers with min assist ambulating. OT Short Term Goal 4 (Week 2): Pt will perform LB dressing with min assist sit to stand. OT Short Term Goal 5 (Week 2): Pt will recall and demonstrate hemi technique for donning pullover shirt without verbal cueing.      Skilled Therapeutic Interventions/Progress Updates:    Pt seen this session to address RUE motor control and dynamic balance with sit to stand practice.  Pt in w/c in room and anxious to go to gym to work on [place].  Pt repeated story of how he had his first CVA while working at least 4x throughout the session. He was able to focus and work well once his attention was brought back to the tasks he was working on. Pt transferred to mat  with min A stand step pivot. Pt presented with increased tone and finger flexor tightness. He worked on [employer] bearing on mat with leaning forward and laterally and PROM. His tone relaxed and finger extension was fully achieved. Active scapular movements for shoulder stability, followed by BUE AROM using dowel bar with forward reaching and trunk rotation. Resisted sh flexion with pt holding yoga block in B hands and pushing block forward against resistance from therapist.  Also worked on controlled shoulder extension with pulling. Sit to stand from [facility] without use of arms 12x with min A to guide hips. Pt returned to w/c, returned to room with Quick release belt, phone and call light in reach.  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

## 2014-05-15 ENCOUNTER — Inpatient Hospital Stay (HOSPITAL_COMMUNITY): Payer: BC Managed Care – PPO

## 2014-05-15 DIAGNOSIS — R208 Other disturbances of skin sensation: Secondary | ICD-10-CM

## 2014-05-15 DIAGNOSIS — I69898 Other sequelae of other cerebrovascular disease: Secondary | ICD-10-CM

## 2014-05-15 LAB — GLUCOSE, CAPILLARY
GLUCOSE-CAPILLARY: 123 mg/dL — AB (ref 70–99)
GLUCOSE-CAPILLARY: 174 mg/dL — AB (ref 70–99)
Glucose-Capillary: 159 mg/dL — ABNORMAL HIGH (ref 70–99)
Glucose-Capillary: 129 mg/dL — ABNORMAL HIGH (ref 70–99)

## 2014-05-15 NOTE — Progress Notes (Signed)
Physical Therapy Session Note  Patient Details  Name: Adam Callahan MRN: [id number] Date of Birth: Jul 30, 1960  Today's Date: 05/15/2014 PT Individual Time: 6195-0932 PT Individual Time Calculation (min): 30 min   Short Term Goals: Week 1:  PT Short Term Goal 1 (Week 1): = LTGs due to anticipated LOS  Skilled Therapeutic Interventions/Progress Updates:    Pt reports his daughter is coming, but did no show up during session. Session focused on neuro re-ed for balance, transfer techniques, gait training, and forced use of RLE while performing balance tasks using step to force increased WB through RLE. Min to mod A for balance during reaching tasks and cues for upright posture. Min A for gait without AD 40' x 2 with focus on RLE control, step length, and posture. Did not use KAFO during this session for increased functional use of RLE with therapist facilitation. Self propelled w/c to and from therapy gym for overall endurance.   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  Locomotion:Ambulation Ambulation/Gait Assistance: 4: Min assist   See FIM for current functional status  Therapy/Group: Individual Therapy  Canary Brim [facility] 05/15/2014, 3:40 PM

## 2014-05-15 NOTE — Progress Notes (Signed)
Subjective/Complaints: No new complaints. Denies pain, cough, sob, n/v Review of systems otherwise neg   Objective: Vital Signs: Blood pressure 132/74, pulse 84, temperature 98 F (36.7 C), temperature source Oral, resp. rate 18, weight 75.932 kg (167 lb 6.4 oz), SpO2 99.00%. No results found. Results for orders placed during the hospital encounter of 05/04/14 (from the past 72 hour(s))  GLUCOSE, CAPILLARY     Status: Abnormal   Collection Time    05/12/14 11:20 AM      Result Value Ref Range   Glucose-Capillary 121 (*) 70 - 99 mg/dL  GLUCOSE, CAPILLARY     Status: Abnormal   Collection Time    05/12/14  4:29 PM      Result Value Ref Range   Glucose-Capillary 117 (*) 70 - 99 mg/dL  GLUCOSE, CAPILLARY     Status: Abnormal   Collection Time    05/12/14  9:14 PM      Result Value Ref Range   Glucose-Capillary 147 (*) 70 - 99 mg/dL   Comment 1 Notify RN    GLUCOSE, CAPILLARY     Status: Abnormal   Collection Time    05/13/14  7:19 AM      Result Value Ref Range   Glucose-Capillary 144 (*) 70 - 99 mg/dL   Comment 1 Notify RN    GLUCOSE, CAPILLARY     Status: None   Collection Time    05/13/14 12:00 PM      Result Value Ref Range   Glucose-Capillary 94  70 - 99 mg/dL  GLUCOSE, CAPILLARY     Status: Abnormal   Collection Time    05/13/14  4:45 PM      Result Value Ref Range   Glucose-Capillary 127 (*) 70 - 99 mg/dL  GLUCOSE, CAPILLARY     Status: Abnormal   Collection Time    05/13/14  8:39 PM      Result Value Ref Range   Glucose-Capillary 200 (*) 70 - 99 mg/dL   Comment 1 Notify RN    GLUCOSE, CAPILLARY     Status: Abnormal   Collection Time    05/14/14  6:54 AM      Result Value Ref Range   Glucose-Capillary 117 (*) 70 - 99 mg/dL   Comment 1 Notify RN    GLUCOSE, CAPILLARY     Status: Abnormal   Collection Time    05/14/14 11:41 AM      Result Value Ref Range   Glucose-Capillary 103 (*) 70 - 99 mg/dL  GLUCOSE, CAPILLARY     Status: Abnormal   Collection  Time    05/14/14  4:55 PM      Result Value Ref Range   Glucose-Capillary 143 (*) 70 - 99 mg/dL  GLUCOSE, CAPILLARY     Status: Abnormal   Collection Time    05/14/14  8:46 PM      Result Value Ref Range   Glucose-Capillary 121 (*) 70 - 99 mg/dL  GLUCOSE, CAPILLARY     Status: Abnormal   Collection Time    05/15/14  6:58 AM      Result Value Ref Range   Glucose-Capillary 159 (*) 70 - 99 mg/dL      Physical Exam  Constitutional: He is oriented to person, place, and time. He appears well-developed.  HENT:  Right facial droop  Eyes: EOM are normal.  Neck: Normal range of motion. Neck supple. No thyromegaly present.  Cardiovascular: Normal rate and regular rhythm.  Respiratory:  Effort normal and breath sounds normal. No respiratory distress.  GI: Soft. Bowel sounds are normal. He exhibits no distension.  Neurological: He is alert and oriented to person, place, and time.  Speech is dysarthric but intelligible. He does follow simple commands. Fair awareness of his deficits. RUE 2/5 prox to distal.. Has difficulty with attention and sequencing. LUE and LLE 4-5/5.  Decreased sensory to LT on Right face, arm, leg. Fair sitting balance Skin: Skin is warm and dry.  Psychiatric:emotional lability   Assessment/Plan: 1. 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 Physiatrist is providing close team supervision and 24 hour management of active medical problems listed below. Physiatrist and rehab team continue to assess barriers to discharge/monitor patient progress toward functional and medical goals.  FIM: FIM - Bathing Bathing Steps Patient Completed: Chest;Right Arm;Abdomen;Front perineal area;Right upper leg;Left upper leg;Left lower leg (including foot);Right lower leg (including foot);Buttocks Bathing: 4:  Min-Patient completes 8-9 62f 10 parts or 75+ percent  FIM - Upper Body Dressing/Undressing Upper body dressing/undressing steps patient completed: Thread/unthread left sleeve of pullover shirt/dress;Put head through opening of pull over shirt/dress;Thread/unthread right sleeve of pullover shirt/dresss;Pull shirt over trunk Upper body dressing/undressing: 5: Supervision: Safety issues/verbal cues FIM - Lower Body Dressing/Undressing Lower body dressing/undressing steps patient completed: Thread/unthread right pants leg;Thread/unthread left pants leg;Don/Doff left shoe;Fasten/unfasten left shoe Lower body dressing/undressing: 3: Mod-Patient completed 50-74% of tasks  FIM - Toileting Toileting steps completed by patient: Adjust clothing prior to toileting;Adjust clothing after toileting Toileting: 3: Mod-Patient completed 2 of 3 steps  FIM - Radio producer Devices: Insurance account manager Transfers: 4-To toilet/BSC: Min A (steadying Pt. > 75%);4-From toilet/BSC: Min A (steadying Pt. > 75%)  FIM - Bed/Chair Transfer Bed/Chair Transfer Assistive Devices: Arm rests;Orthosis Bed/Chair Transfer: 4: Chair or W/C > Bed: Min A (steadying Pt. > 75%);4: Bed > Chair or W/C: Min A (steadying Pt. > 75%)  FIM - Locomotion: Wheelchair Distance: 200 Locomotion: Wheelchair: 5: Travels 150 ft or more: maneuvers on rugs and over door sills with supervision, cueing or coaxing FIM - Locomotion: Ambulation Locomotion: Ambulation Assistive Devices: Other (comment);Orthosis (R HHA) Ambulation/Gait Assistance: 4: Min assist Locomotion: Ambulation: 4: Travels 150 ft or more with minimal assistance (Pt.>75%)  Comprehension Comprehension Mode: Auditory Comprehension: 3-Understands basic 50 - 74% of the time/requires cueing 25 - 50%  of the time  Expression Expression Mode: Verbal Expression: 3-Expresses basic 50 - 74% of the time/requires cueing 25 - 50% of the time. Needs to repeat parts of  sentences.  Social Interaction Social Interaction: 4-Interacts appropriately 75 - 89% of the time - Needs redirection for appropriate language or to initiate interaction.  Problem Solving Problem Solving: 3-Solves basic 50 - 74% of the time/requires cueing 25 - 49% of the time  Memory Memory: 2-Recognizes or recalls 25 - 49% of the time/requires cueing 51 - 75% of the time  Medical Problem List and Plan:  1. 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 DVT Prophylaxis/Anticoagulation: Subcutaneous heparin. Monitor platelet counts any signs of bleeding  3. Pain Management: Ultram as needed. Monitor with increased mobility  4. Diabetes mellitus with peripheral neuropathy. Hemoglobin A1c 11.3. NovoLog 4 units 3 times a day, Lantus insulin 20 units daily.   -good control at  present 5. Neuropsych: This patient is not capable of making decisions on his own behalf.  6. Skin/Wound Care: Routine skin checks  7. Fluids/Electrolytes/Nutrition: Strict I and O.'s.  Eating well  8. Hyperlipidemia. Lipitor  9. Poor medical compliance due to financial constraints.     LOS (Days) 11 A FACE TO FACE EVALUATION WAS PERFORMED  Ary Rudnick T 05/15/2014, 7:42 AM

## 2014-05-16 ENCOUNTER — Inpatient Hospital Stay (HOSPITAL_COMMUNITY): Payer: BC Managed Care – PPO | Admitting: Occupational Therapy

## 2014-05-16 ENCOUNTER — Inpatient Hospital Stay (HOSPITAL_COMMUNITY): Payer: Self-pay | Admitting: Speech Pathology

## 2014-05-16 ENCOUNTER — Inpatient Hospital Stay (HOSPITAL_COMMUNITY): Payer: BC Managed Care – PPO | Admitting: Physical Therapy

## 2014-05-16 LAB — GLUCOSE, CAPILLARY
GLUCOSE-CAPILLARY: 115 mg/dL — AB (ref 70–99)
GLUCOSE-CAPILLARY: 151 mg/dL — AB (ref 70–99)
Glucose-Capillary: 144 mg/dL — ABNORMAL HIGH (ref 70–99)
Glucose-Capillary: 169 mg/dL — ABNORMAL HIGH (ref 70–99)

## 2014-05-16 MED ORDER — CLOPIDOGREL BISULFATE 75 MG PO TABS: 75.0000 mg | ORAL_TABLET | Freq: Every day | ORAL | Status: DC

## 2014-05-16 MED ORDER — ASPIRIN 81 MG PO CHEW: 81.0000 mg | CHEWABLE_TABLET | Freq: Every day | ORAL | Status: DC

## 2014-05-16 MED ORDER — INSULIN ASPART 100 UNIT/ML FLEXPEN: 4.0000 [IU] | PEN_INJECTOR | Freq: Three times a day (TID) | SUBCUTANEOUS | Status: DC

## 2014-05-16 MED ORDER — SENNOSIDES-DOCUSATE SODIUM 8.6-50 MG PO TABS: 2.0000 | ORAL_TABLET | Freq: Every day | ORAL | Status: DC

## 2014-05-16 MED ORDER — ATORVASTATIN CALCIUM 20 MG PO TABS: 20.0000 mg | ORAL_TABLET | Freq: Every day | ORAL | Status: DC

## 2014-05-16 MED ORDER — INSULIN GLARGINE 100 UNIT/ML SOLOSTAR PEN: 20.0000 [IU] | PEN_INJECTOR | Freq: Every day | SUBCUTANEOUS | Status: DC

## 2014-05-16 NOTE — Progress Notes (Signed)
Occupational Therapy Discharge Summary  Patient Details  Name: Adam Callahan MRN: [id number] Date of Birth: 1960/11/19  Today's Date: 05/16/2014 OT Individual Time: 1330-1405 OT Individual Time Calculation (min): 35 min    Session Note: Pt's daughter and ex-wife present for session.  Both were instructed on shower/tub transfers with use of the gait belt and hand held assist.  Pt's ex-wife provided hands on practice since she will be the one assisting him with this.  Educated her on use of the tub bench as well as hand held shower for safety.  Re-emphasized the need for pt to have 24 hour assistance and that pt cannot perform any transfers without use of the gait belt and someone assisting him.  They both voiced understanding of the situation.  Pt needed min assist to perform the transfer to the tub bench.  Also discussed the need for mod to max instructional cueing to keep pt safe and to incorporate hemi dressing techniques as pt cannot carry them over from session to session.    Patient has met 6 of 10 long term goals due to improved balance, postural control, ability to compensate for deficits and improved awareness.  Patient to discharge at [facility] Assist level.  Patient's care partner is independent to provide the necessary physical and cognitive assistance at discharge.    Reasons goals not met: Pt continues to need min assist for all selfcare related transfers.  He also continues to need mod facilitation for RUE functional use during selfcare tasks.   Recommendation:  Patient will benefit from ongoing skilled OT services in home health setting to continue to advance functional skills in the area of BADL.  Feel pt continues to need 24 hour supervision for safety with min assist level for all functional transfers to the toilet and shower.  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  Pt also with decreased ability to recall and carry over from one session to  the next with regards to safety and hemi techniques.    Equipment: No equipment provided  Reasons for discharge: treatment goals met and discharge from hospital  Patient/family agrees with progress made and goals achieved: Yes  OT Discharge Precautions/Restrictions  Precautions Precautions: Fall Precaution Comments: R hemiplegia from prior CVA Restrictions Weight Bearing Restrictions: No  Pain Pain Assessment Pain Assessment: No/denies pain ADL  See FIM scale for details  Vision/Perception  Vision- History Baseline Vision/History: No visual deficits Patient Visual Report: No change from baseline Vision- Assessment Vision Assessment?: Yes Eye Alignment: Within Functional Limits Ocular Range of Motion: Within Functional Limits Alignment/Gaze Preference: Within Defined Limits Tracking/Visual Pursuits: Able to track stimulus in all quads without difficulty Visual Fields: Right homonymous hemianopsia;Right visual field deficit  Cognition Overall Cognitive Status: History of cognitive impairments - at baseline Arousal/Alertness: Awake/alert Orientation Level: Oriented X4 Attention: Selective Focused Attention: Appears intact Sustained Attention: Impaired Sustained Attention Impairment: Functional basic Selective Attention Impairment: Functional basic Memory: Impaired Memory Impairment: Decreased recall of new information;Decreased short term memory Awareness: Impaired Awareness Impairment: Intellectual impairment Problem Solving: Impaired Problem Solving Impairment: Functional basic Safety/Judgment: Impaired Comments: Pt with very little carryover from day to day with regards to selfcare techniques and safety.  Pt continues to need mod to max instructional cueing for sequencing basic ADL tasks including dressing.  Sensation Sensation Light Touch: Impaired Detail Light Touch Impaired Details: Impaired RUE;Impaired RLE Stereognosis Impaired Details: Impaired  RUE;Impaired RLE Hot/Cold: Not tested Proprioception: Impaired Detail Proprioception Impaired Details:  Impaired RUE;Impaired RLE Coordination Gross Motor Movements are Fluid and Coordinated: No Fine Motor Movements are Fluid and Coordinated: No Coordination and Movement Description: Pt with spastic hemiparesis in the RUE and RLE from previous CVA.  Pt able to use the RUE as a stabilizer during bathing with mod hand over hand assist. .  Increased flexor tone noted in the digits and elbow as well with only 50% gross digit flexion and no acitve extension present.  Motor  Motor Motor: Abnormal postural alignment and control;Hemiplegia Motor - Discharge Observations: R hemiplegia; impaired selective control and grading of RLE movement Mobility  Bed Mobility Bed Mobility: Supine to Sit Supine to Sit: HOB flat;5: Supervision Supine to Sit Details: Verbal cues for precautions/safety;Verbal cues for technique Transfers Transfers: Sit to Stand Sit to Stand: 5: Supervision;From toilet;Without upper extremity assist Sit to Stand Details: Verbal cues for precautions/safety;Verbal cues for technique Stand to Sit: 5: Supervision Stand to Sit Details (indicate cue type and reason): Verbal cues for precautions/safety;Verbal cues for technique  Trunk/Postural Assessment  Cervical Assessment Cervical Assessment: Within Functional Limits Thoracic Assessment Thoracic Assessment: Within Functional Limits Lumbar Assessment Lumbar Assessment: Within Functional Limits Postural Control Postural Limitations: Pt with right shoulder/scapular elevation, slight right trunk elongation  Balance Dynamic Sitting Balance Dynamic Sitting - Balance Support: No upper extremity supported Dynamic Sitting - Level of Assistance: 5: Stand by assistance Static Standing Balance Static Standing - Balance Support: No upper extremity supported Static Standing - Level of Assistance: 5: Stand by assistance (during selfcare  tasks) Dynamic Standing Balance Dynamic Standing - Balance Support: Left upper extremity supported Dynamic Standing - Level of Assistance: 4: Min assist Dynamic Standing - Comments: Pt continues to need assistance for functional transfers, hand held assist as well as use of the gait belt.  Extremity/Trunk Assessment RUE Assessment RUE Assessment: Exceptions to [facility] RUE Strength RUE Overall Strength Comments: Pt with history of spastic right hemiparesis in the UE.  AAROM shoulder flexion 0-90 degrees.  Synergy pattern in Brunnstrum stage III level with the hand also at stage III.  He dmonstrates increased flexor tone in the right elbow and digits with pt demonstrating limiting PROM digit extension at DIP joints.  He is able to exhibit gross AROM digit flexion but not active extension. LUE Assessment LUE Assessment: Within Functional Limits  See FIM for current functional status  Linwood Gullikson OTR/L 05/16/2014, 4:13 PM

## 2014-05-16 NOTE — Discharge Summary (Signed)
Discharge summary job # [id number]

## 2014-05-16 NOTE — Plan of Care (Signed)
Problem: RH PAIN MANAGEMENT Goal: RH STG PAIN MANAGED AT OR BELOW PT'S PAIN GOAL Less than or equal to 2  Outcome: Completed/Met Date Met:  05/16/14 No c/o pain

## 2014-05-16 NOTE — Progress Notes (Signed)
Social Work [name], male   DOB: Oct 16, 1960, 53 y.o.   MRN: [id number] Met with pt to discuss PCP he has no preference have found him a PCP through [facility].  Spoke with [name] who will be here this afternoon for educatoon with OT and SP. Asked her to come in tomorrow to have PT for 30 min have asked to schedule for 8;30-9;00 am.  [name] aware of our recommendation for 24 hr care at discharge.  She voiced understanding. This worker still has doubts pt will be left alone at home and the intentions of [name], coming back into the picture now.  Daughter to take care of pt's finances but unsure if she will follow through with. Pt wants to go home and feels he will have 24 hr care there.  His main goal is to get back to his home.  Have made referral to OP rehab for his further therapies. [name] to transport him there. See if [name] shows up for therapies.

## 2014-05-16 NOTE — Progress Notes (Signed)
Social Work [name], male   DOB: 1960/09/04, 53 y.o.   MRN: [id number] Met with pt and daughter was here to discuss discharge and needs.  She will get his medicines before she leaves today to go back to [place]. Pam-PA sent prescriptions to [facility] on [address] where pt has gotten his medicines before.  Have given daughter written prescriptions for CBG machine and strips.  She also is going to pay pt's electric bill.  She reports she and pt plan to have his check go to her and she will pay his bills.  This way she does not need to worry about him being taken advantage of.  Pt is in full agreement with this.  She is working on [employer] also.  She is ok with [place] staying with Dad, but not dealing with his money. Pt is happy to see his daughter.  He states: " She is my heart."  Daughter reports his disability has come thorough and she plans to be the responsible for his bill paying.  Plan to finish education tomorrow.

## 2014-05-16 NOTE — Progress Notes (Signed)
Occupational Therapy Session Note  Patient Details  Name: Adam Callahan MRN: [id number] Date of Birth: 28-Oct-1960  Today's Date: 05/16/2014 OT Individual Time: 1000-1100 OT Individual Time Calculation (min): 60 min    Short Term Goals: Week 2:  OT Short Term Goal 1 (Week 2): Pt performed bathing with supervison sit to stand. OT Short Term Goal 2 (Week 2): Pt will perform toileting sit to stand with supervision. OT Short Term Goal 3 (Week 2): Pt will perform shower transfers with min assist ambulating. OT Short Term Goal 4 (Week 2): Pt will perform LB dressing with min assist sit to stand. OT Short Term Goal 5 (Week 2): Pt will recall and demonstrate hemi technique for donning pullover shirt without verbal cueing.  Skilled Therapeutic Interventions/Progress Updates:    Pt performed bathing and dressing during session.  Pt needed min hand held assist to transfer to the walk-in shower.  He was able to bathe sit to stand with mod instructional cueing to remember the techniques for washing the LUE.  He needed mod instructional cueing as well to not stand up with soapy feet to attempt washing his bottom.  He was able to dry off with supervision but also needed max instructional cueing to sequence as pt tends to dry off his bottom and then sit back down on the wet shower bench.  Pt repeated this process 3 times.  Dressing sit to stand at the sink.  He was able to donn his pants with supervision including buttoning them.  He was unable to remember hemi technique for donning pullover shirt and instead donned the left sleeve and then pulled the garment over his head without threading the right sleeve first.  He was able to complete the task with supervision but with decreased efficiency.  Therapist assisted with donning KAFO and right shoe.  Pt was able to donn the left.    Therapy Documentation Precautions:  Precautions Precautions: Fall Precaution Comments: R hemiplegia from prior  CVA Restrictions Weight Bearing Restrictions: No  Pain: Pain Assessment Pain Assessment: No/denies pain Faces Pain Scale: No hurt ADL: See FIM for current functional status  Therapy/Group: Individual Therapy  Eileene Kisling OTR/L 05/16/2014, 11:03 AM

## 2014-05-16 NOTE — Progress Notes (Signed)
Physical Therapy Session Note  Patient Details  Name: Adam Callahan MRN: [id number] Date of Birth: 1961/03/08  Today's Date: 05/16/2014 PT Individual Time: 0827-0929 PT Individual Time Calculation (min): 62 min   Short Term Goals: Week 2:   STG's = LTG's secondary to anticipated LOS.  Skilled Therapeutic Interventions/Progress Updates:    Pt received seated in w/c; agreeable to therapy. Session focused on assessing/addressing stability and independence with functional mobility. See discharge evaluation for detailed findings. Strongly recommended to pt that caregiver, [name], attend physical therapy session prior to discharge to ensure adequate hands-on training is received to facilitate safe discharge home. Pt verbalized understanding. Notified CSW, Jacqlyn Larsen, of need for completion of family training with caregiver. Session ended in pt room, where pt was left seated in w/c with quick release belt in place for safety and all needs within reach.   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 Restrictions Weight Bearing Restrictions: No Pain: Pain Assessment Pain Assessment: No/denies pain Mobility: Bed Mobility Bed Mobility: Supine to Sit Supine to Sit: HOB flat;5: Supervision Supine to Sit Details: Verbal cues for precautions/safety;Verbal cues for technique Transfers Sit to Stand: 5: Supervision;From toilet;Without upper extremity assist Sit to Stand Details: Verbal cues for precautions/safety;Verbal cues for technique Stand to Sit: 5: Supervision Stand to Sit Details (indicate cue type and reason): Verbal cues for precautions/safety;Verbal cues for technique Trunk/Postural Assessment : Cervical Assessment Cervical Assessment: Within Functional Limits Thoracic Assessment Thoracic Assessment: Within Functional Limits Lumbar Assessment Lumbar Assessment: Within Functional Limits Postural Control Postural  Limitations: Pt with right shoulder/scapular elevation, slight right trunk elongation  Balance: Dynamic Sitting Balance Dynamic Sitting - Balance Support: No upper extremity supported Dynamic Sitting - Level of Assistance: 5: Stand by assistance Static Standing Balance Static Standing - Balance Support: No upper extremity supported Static Standing - Level of Assistance: 5: Stand by assistance (during selfcare tasks) Dynamic Standing Balance Dynamic Standing - Balance Support: Left upper extremity supported Dynamic Standing - Level of Assistance: 4: Min assist Dynamic Standing - Comments: Pt continues to need assistance for functional transfers, hand held assist as well as use of the gait belt.   See FIM for current functional status  Therapy/Group: Individual Therapy  [place], Malva Cogan 05/16/2014, 7:14 PM

## 2014-05-16 NOTE — Plan of Care (Signed)
Problem: RH Bed to Chair Transfers Goal: LTG Patient will perform bed/chair transfers w/assist (PT) LTG: Patient will perform bed/chair transfers with assistance, with/without cues (PT).  Outcome: Not Met (add Reason) Pt able to perform sit<>stand with supervision; however, pt required min A to transfer from bed<>chair.

## 2014-05-16 NOTE — Progress Notes (Signed)
Speech Language Pathology Daily Session Note  Patient Details  Name: Adam Callahan MRN: [id number] Date of Birth: Oct 06, 1960  Today's Date: 05/16/2014 SLP Individual Time: 1700-1749 SLP Individual Time Calculation (min): 30 min  Short Term Goals: Week 2: SLP Short Term Goal 1 (Week 2): Pt will demonstrate functional problem solving for basic and familiar tasks with Min A multimodal cues.  SLP Short Term Goal 2 (Week 2): Pt will utilize memory compensatory strategies to recall and facilitate improved carryover of daily information with Mod A multimodal cues.  SLP Short Term Goal 3 (Week 2): Pt will improve topic maintenance during functional conversations with no more than 3 cues needed for redirection.  SLP Short Term Goal 4 (Week 2): Pt will improve sustained attention during functional tasks for 10-15 minutes with min assist.    Skilled Therapeutic Interventions:  Pt was seen for skilled ST targeting family training.  Upon arrival, pt was seated upright in wheelchair, awake, alert, and pleasantly interactive.  Pt's daughter, Cleon Dew, was present for the duration of today's training session.  SLP provided skilled education related to distraction management techniques and compensatory strategies to facilitate improved recall of daily information.  SLP also strongly recommended that pt have 24/7 supervision for safety as well as assistance for medication and financial management at discharge.  Pt's daughter remained somewhat engaged throughout training session; however, she was frequently preoccupied with making and receiving phone calls to prevent pt's electricity from being shut off.  Due to this, in addition to the fact that pt's daughter will not be his primary caregiver, SLP recommends further education prior to discharge with pt's ex-wife, [name].  Continue per current plan of care.     FIM:  Comprehension Comprehension Mode: Auditory Comprehension: 3-Understands basic 50 - 74% of the  time/requires cueing 25 - 50%  of the time Expression Expression Mode: Verbal Expression: 4-Expresses basic 75 - 89% of the time/requires cueing 10 - 24% of the time. Needs helper to occlude trach/needs to repeat words. Social Interaction Social Interaction: 4-Interacts appropriately 75 - 89% of the time - Needs redirection for appropriate language or to initiate interaction. Problem Solving Problem Solving: 3-Solves basic 50 - 74% of the time/requires cueing 25 - 49% of the time Memory Memory: 2-Recognizes or recalls 25 - 49% of the time/requires cueing 51 - 75% of the time   Pain Pain Assessment Pain Assessment: No/denies pain  Therapy/Group: Individual Therapy  Sarin Comunale, Selinda Orion 05/16/2014, 4:26 PM

## 2014-05-16 NOTE — Plan of Care (Signed)
Problem: RH Dressing Goal: LTG Patient will perform lower body dressing w/assist (OT) LTG: Patient will perform lower body dressing with assist, with/without cues in positioning using equipment (OT)  Outcome: Not Met (add Reason) Pt still needs min assist level.  Problem: RH Functional Use of Upper Extremity Goal: LTG Patient will use RT/LT upper extremity as a (OT) LTG: Patient will use right/left upper extremity as a stabilizer/gross assist/diminished/nondominant/dominant level with assist, with/without cues during functional activity (OT)  Outcome: Not Met (add Reason) Pt needs mod assist for use of the RUE with bathing tasks.  Problem: RH Toilet Transfers Goal: LTG Patient will perform toilet transfers w/assist (OT) LTG: Patient will perform toilet transfers with assist, with/without cues using equipment (OT)  Outcome: Not Met (add Reason) Pt needs min assist level  Problem: RH Tub/Shower Transfers Goal: LTG Patient will perform tub/shower transfers w/assist (OT) LTG: Patient will perform tub/shower transfers with assist, with/without cues using equipment (OT)  Outcome: Not Met (add Reason) Pt needs min assist.

## 2014-05-16 NOTE — Discharge Summary (Signed)
NAME:  Adam Callahan, Adam Callahan NO.:  [id number]  MEDICAL RECORD NO.:  [id number]  LOCATION:  [id number]                        FACILITY:  [place]  PHYSICIAN:  Adam Callahan, M.D.DATE OF BIRTH:  1961-01-30  DATE OF ADMISSION:  05/04/2014 DATE OF DISCHARGE:  05/17/2014                              DISCHARGE SUMMARY   DISCHARGE DIAGNOSES: 1. Functional deficits secondary to right PCA infarct, felt to be     secondary to small vessel disease as well as history of prior left     PCA infarct. 2. Subcutaneous heparin for DVT prophylaxis. 3. Diabetes mellitus, peripheral neuropathy. 4. Pain management. 5. Hyperlipidemia. 6. Poor medical compliance due to financial constraints.  HISTORY OF PRESENT ILLNESS:  This is a 53 year old right-handed male with history of diabetes mellitus, left PCA infarct in the past with residual right-sided weakness and slurred speech which he did receive inpatient rehab services in December of 2014.  Patient noncompliant with medications at home due to financial issues.  He lives alone, used a walker prior to admission.  Admitted on April 30, 2014, with blurred vision and headache.  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  The patient did not receive tPA.  Echocardiogram with ejection fraction 46%, grade 1 diastolic dysfunction.  Carotid Dopplers with no ICA stenosis.  Lower extremity Dopplers negative for DVT.  TEE completed showing no thrombus, small incidental findings of small PFO.  No plan for loop recorder. Neurology Services consulted, placed on aspirin and Plavix for CVA prophylaxis.  Subcutaneous heparin for DVT prophylaxis.  Hemoglobin A1c of 11.3 with insulin therapy as directed.  Physical and occupational therapy evaluations completed.  The patient was admitted for comprehensive rehab.  PAST MEDICAL HISTORY:   See discharge diagnoses.  SOCIAL HISTORY:  Lives alone.  FUNCTIONAL HISTORY:  Prior to admission independent with assistive device.  Functional status upon admission to rehab services minimal assist ambulate 175 feet to person handheld assistance.  Min assist stand pivot transfers.  Min to mod assist activities of daily living.  PHYSICAL EXAMINATION:  VITAL SIGNS:  Blood pressure 139/83, pulse 91, temperature 98.5, respirations 18. GENERAL:  This was an alert male, oriented x3.  Speech was mildly dysarthric, but intelligible.  He followed simple commands with fair and awareness of his deficits. LUNGS:  Clear to auscultation. CARDIAC:  Regular rate and rhythm. ABDOMEN:  Soft, nontender.  Good bowel sounds.  REHABILITATION HOSPITAL COURSE:  Patient was admitted to inpatient rehab services with therapies initiated on a 3-hour daily basis consisting of physical therapy, occupational therapy, and speech therapy with rehabilitation nursing.  Following issues were addressed during the patient's rehabilitation stay.  Pertaining to [name] right PCA infarct secondary to small vessel disease with prior history of left PCA infarct remained stable, maintained on aspirin and Plavix therapy.  He would follow up with Neurology Services.  He was continued on subcutaneous heparin during his hospital stay for DVT prophylaxis.  No bleeding episodes.  Diabetes mellitus, peripheral neuropathy. Hemoglobin A1c of 11.3.  Insulin therapy as directed.  He received full counseling in regard  to maintaining diabetic diet.  He was continued on Lipitor for hyperlipidemia.  He did have a history of poor medical compliance due to financial constraints.  Follow up financial counselor, all issues in regard to maintain medications had been discussed.  The patient was using Ultram on limited basis for pain.  The patient received weekly collaborative interdisciplinary team conferences to discuss estimated length of  stay, family teaching, and any barriers to discharge.  Sessions focused on balance transfer techniques, gait training, and forced use of right lower extremity while performing balance tasks.  Min to mod assist for balance during reaching task and cues for upright posture, minimal assist ambulate without assistive device 40 feet x2.  Activities of daily living.  Patient in wheelchair in his room.  Continuing to work towards discharge.  He was able to gather his belongings for bathing, dressing, and grooming.  Also worked on controlled shoulder extension with pulling.  Family teaching was to be completed with his daughter and discharged to home with home health physical and occupational therapy.  DISCHARGE MEDICATIONS:  Aspirin 81 mg p.o. daily, Lipitor 20 mg p.o. daily, Plavix 75 mg p.o. daily, NovoLog insulin 4 units t.i.d., Lantus insulin 20 units subcutaneously daily, Ultram 50 mg p.o. every 6 hours as needed pain and dispense of #60 tablets.  DIET:  Diabetic diet.  SPECIAL INSTRUCTIONS:  The patient would follow up Dr. Alysia Callahan at the outpatient rehab center as directed; Dr. Erlinda Callahan of Neurology Services, 1 month call for appointment.  Case management arranging PCP followup.     Adam Callahan, P.A.   ______________________________ Adam Callahan, M.D.    DA/MEDQ  D:  05/16/2014  T:  05/16/2014  Job:  [id number]  cc:   Adam Callahan, Dr. Charlett Callahan, M.D.

## 2014-05-16 NOTE — Progress Notes (Signed)
Pt able to administer insulin with use of left hand. However, unable to draw appropriate amount in syringe due to limited use of R hand. Therefore, initiated insulin administration education and teach back with caregiver on 05/13/14 with instructions for education and teach back to continue.   Per caregiver, Adam [place]vale, RN continued with insulin instructions during the evening shift on 05/15/14. Caregiver  indicated that she had given her mother insulin in the past while she was her caregiver. . However, she would like additional training in the morning, prior to pt's discharge on 05/17/14., to solidify her skill set.

## 2014-05-16 NOTE — Progress Notes (Signed)
Subjective/Complaints:  Discussed importance of PCP f/u  Objective: Vital Signs: Blood pressure 122/81, pulse 85, temperature 97.5 F (36.4 C), temperature source Oral, resp. rate 18, weight 75.932 kg (167 lb 6.4 oz), SpO2 100.00%. No results found. Results for orders placed during the hospital encounter of 05/04/14 (from the past 72 hour(s))  GLUCOSE, CAPILLARY     Status: None   Collection Time    05/13/14 12:00 PM      Result Value Ref Range   Glucose-Capillary 94  70 - 99 mg/dL  GLUCOSE, CAPILLARY     Status: Abnormal   Collection Time    05/13/14  4:45 PM      Result Value Ref Range   Glucose-Capillary 127 (*) 70 - 99 mg/dL  GLUCOSE, CAPILLARY     Status: Abnormal   Collection Time    05/13/14  8:39 PM      Result Value Ref Range   Glucose-Capillary 200 (*) 70 - 99 mg/dL   Comment 1 Notify RN    GLUCOSE, CAPILLARY     Status: Abnormal   Collection Time    05/14/14  6:54 AM      Result Value Ref Range   Glucose-Capillary 117 (*) 70 - 99 mg/dL   Comment 1 Notify RN    GLUCOSE, CAPILLARY     Status: Abnormal   Collection Time    05/14/14 11:41 AM      Result Value Ref Range   Glucose-Capillary 103 (*) 70 - 99 mg/dL  GLUCOSE, CAPILLARY     Status: Abnormal   Collection Time    05/14/14  4:55 PM      Result Value Ref Range   Glucose-Capillary 143 (*) 70 - 99 mg/dL  GLUCOSE, CAPILLARY     Status: Abnormal   Collection Time    05/14/14  8:46 PM      Result Value Ref Range   Glucose-Capillary 121 (*) 70 - 99 mg/dL  GLUCOSE, CAPILLARY     Status: Abnormal   Collection Time    05/15/14  6:58 AM      Result Value Ref Range   Glucose-Capillary 159 (*) 70 - 99 mg/dL  GLUCOSE, CAPILLARY     Status: Abnormal   Collection Time    05/15/14 11:35 AM      Result Value Ref Range   Glucose-Capillary 123 (*) 70 - 99 mg/dL  GLUCOSE, CAPILLARY     Status: Abnormal   Collection Time    05/15/14  4:44 PM      Result Value Ref Range   Glucose-Capillary 129 (*) 70 - 99 mg/dL   GLUCOSE, CAPILLARY     Status: Abnormal   Collection Time    05/15/14  8:43 PM      Result Value Ref Range   Glucose-Capillary 174 (*) 70 - 99 mg/dL  GLUCOSE, CAPILLARY     Status: Abnormal   Collection Time    05/16/14  7:01 AM      Result Value Ref Range   Glucose-Capillary 144 (*) 70 - 99 mg/dL      Physical Exam  Constitutional: He is oriented to person, place, and time. He appears well-developed.  HENT:  Right facial droop  Eyes: EOM are normal.  Neck: Normal range of motion. Neck supple. No thyromegaly present.  Cardiovascular: Normal rate and regular rhythm.  Respiratory: Effort normal and breath sounds normal. No respiratory distress.  GI: Soft. Bowel sounds are normal. He exhibits no distension.  Neurological: He is  alert and oriented to person, place, and time.  Speech is dysarthric but intelligible. He does follow simple commands. Fair awareness of his deficits. RUE 2/5 prox to distal.. Has difficulty with attention and sequencing. LUE and LLE 4-5/5.  Decreased sensory to LT on Right face, arm, leg. Fair sitting balance Skin: Skin is warm and dry.  Psychiatric:emotional lability   Assessment/Plan: 1. 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 Physiatrist is providing close team supervision and 24 hour management of active medical problems listed below. Physiatrist and rehab team continue to assess barriers to discharge/monitor patient progress toward functional and medical goals. Should be ready for D/C in am FIM: FIM - Bathing Bathing Steps Patient Completed: Chest;Right Arm;Abdomen;Front perineal area;Right upper leg;Left upper leg;Left lower leg (including foot);Right lower leg (including foot);Buttocks Bathing: 4: Min-Patient completes 8-9 37f 10 parts or 75+ percent  FIM - Upper Body  Dressing/Undressing Upper body dressing/undressing steps patient completed: Thread/unthread left sleeve of pullover shirt/dress;Put head through opening of pull over shirt/dress;Thread/unthread right sleeve of pullover shirt/dresss;Pull shirt over trunk Upper body dressing/undressing: 5: Supervision: Safety issues/verbal cues FIM - Lower Body Dressing/Undressing Lower body dressing/undressing steps patient completed: Thread/unthread right pants leg;Thread/unthread left pants leg;Don/Doff left shoe;Fasten/unfasten left shoe Lower body dressing/undressing: 3: Mod-Patient completed 50-74% of tasks  FIM - Toileting Toileting steps completed by patient: Adjust clothing after toileting Toileting: 3: Mod-Patient completed 2 of 3 steps  FIM - Radio producer Devices: Insurance account manager Transfers: 4-From toilet/BSC: Min A (steadying Pt. > 75%);4-To toilet/BSC: Min A (steadying Pt. > 75%)  FIM - Bed/Chair Transfer Bed/Chair Transfer Assistive Devices: Arm rests Bed/Chair Transfer: 4: Chair or W/C > Bed: Min A (steadying Pt. > 75%);4: Bed > Chair or W/C: Min A (steadying Pt. > 75%)  FIM - Locomotion: Wheelchair Distance: 200 Locomotion: Wheelchair: 5: Travels 150 ft or more: maneuvers on rugs and over door sills with supervision, cueing or coaxing FIM - Locomotion: Ambulation Locomotion: Ambulation Assistive Devices: Other (comment);Orthosis (R HHA) Ambulation/Gait Assistance: 4: Min assist Locomotion: Ambulation: 2: Travels 85 - 149 ft with minimal assistance (Pt.>75%)  Comprehension Comprehension Mode: Auditory Comprehension: 3-Understands basic 50 - 74% of the time/requires cueing 25 - 50%  of the time  Expression Expression Mode: Verbal Expression: 3-Expresses basic 50 - 74% of the time/requires cueing 25 - 50% of the time. Needs to repeat parts of sentences.  Social Interaction Social Interaction: 4-Interacts appropriately 75 - 89% of the time - Needs redirection  for appropriate language or to initiate interaction.  Problem Solving Problem Solving: 3-Solves basic 50 - 74% of the time/requires cueing 25 - 49% of the time  Memory Memory: 2-Recognizes or recalls 25 - 49% of the time/requires cueing 51 - 75% of the time  Medical Problem List and Plan:  1. 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 DVT Prophylaxis/Anticoagulation: Subcutaneous heparin. Monitor platelet counts any signs of bleeding  3. Pain Management: Ultram as needed. Monitor with increased mobility  4. Diabetes mellitus with peripheral neuropathy. Hemoglobin A1c 11.3. NovoLog 4 units 3 times a day, Lantus insulin 20 units daily.   -good control at present 5. Neuropsych: This patient is not capable of making decisions on his own behalf.  6. Skin/Wound Care: Routine skin checks  7. Fluids/Electrolytes/Nutrition: Strict I and O.'s.  Eating well  8. Hyperlipidemia. Lipitor  9. Poor medical compliance due to financial constraints.     LOS (Days) 12 A FACE TO FACE EVALUATION WAS PERFORMED  KIRSTEINS,ANDREW E 05/16/2014, 8:32 AM

## 2014-05-16 NOTE — Discharge Instructions (Signed)
Inpatient Rehab Discharge Instructions  Adam Callahan Discharge date and time: No discharge date for patient encounter.   Activities/Precautions/ Functional Status: Activity: activity as tolerated Diet: diabetic diet Wound Care: none needed Functional status:  ___ No restrictions     ___ Walk up steps independently ___ 24/7 supervision/assistance   ___ Walk up steps with assistance ___ Intermittent supervision/assistance  ___ Bathe/dress independently ___ Walk with walker     ___ Bathe/dress with assistance ___ Walk Independently    ___ Shower independently _x__ Walk with assistance    ___ Shower with assistance ___ No alcohol     ___ Return to work/school ________    STROKE/TIA DISCHARGE INSTRUCTIONS SMOKING Cigarette smoking nearly doubles your risk of having a stroke & is the single most alterable risk factor  If you smoke or have smoked in the last 12 months, you are advised to quit smoking for your health.  Most of the excess cardiovascular risk related to smoking disappears within a year of stopping.  Ask you doctor about anti-smoking medications  [place] Quit Line: 1-800-QUIT NOW  Free Smoking Cessation Classes [phone]  CHOLESTEROL Know your levels; limit fat & cholesterol in your diet  Lipid Panel     Component Value Date/Time   CHOL 195 04/30/2014 0015   TRIG 88 04/30/2014 0015   HDL 41 04/30/2014 0015   CHOLHDL 4.8 04/30/2014 0015   VLDL 18 04/30/2014 0015   LDLCALC 136* 04/30/2014 0015      Many patients benefit from treatment even if their cholesterol is at goal.  Goal: Total Cholesterol (CHOL) less than 160  Goal:  Triglycerides (TRIG) less than 150  Goal:  HDL greater than 40  Goal:  LDL (LDLCALC) less than 100   BLOOD PRESSURE American Stroke Association blood pressure target is less that 120/80 mm/Hg  Your discharge blood pressure is:  BP: 122/81 mmHg  Monitor your blood pressure  Limit your salt and alcohol intake  Many individuals will  require more than one medication for high blood pressure  DIABETES (A1c is a blood sugar average for last 3 months) Goal HGBA1c is under 7% (HBGA1c is blood sugar average for last 3 months)  Diabetes:     Lab Results  Component Value Date   HGBA1C 11.1* 04/30/2014     Your HGBA1c can be lowered with medications, healthy diet, and exercise.  Check your blood sugar as directed by your physician  Call your physician if you experience unexplained or low blood sugars.  PHYSICAL ACTIVITY/REHABILITATION Goal is 30 minutes at least 4 days per week  Activity: Increase activity slowly, Therapies: Physical Therapy: Home Health Return to work:   Activity decreases your risk of heart attack and stroke and makes your heart stronger.  It helps control your weight and blood pressure; helps you relax and can improve your mood.  Participate in a regular exercise program.  Talk with your doctor about the best form of exercise for you (dancing, walking, swimming, cycling).  DIET/WEIGHT Goal is to maintain a healthy weight  Your discharge diet is:    liquids Your height is:    Your current weight is: Weight: 75.932 kg (167 lb 6.4 oz) Your Body Mass Index (BMI) is:     Following the type of diet specifically designed for you will help prevent another stroke.  Your goal weight range is:    Your goal Body Mass Index (BMI) is 19-24.  Healthy food habits can help reduce 3 risk factors for  stroke:  High cholesterol, hypertension, and excess weight.  RESOURCES Stroke/Support Group:  Call [phone]   STROKE EDUCATION PROVIDED/REVIEWED AND GIVEN TO PATIENT Stroke warning signs and symptoms How to activate emergency medical system (call 911). Medications prescribed at discharge. Need for follow-up after discharge. Personal risk factors for stroke. Pneumonia vaccine given:  Flu vaccine given:  My questions have been answered, the writing is legible, and I understand these instructions.  I will adhere  to these goals & educational materials that have been provided to me after my discharge from the hospital.     Special Instructions: 1. Check blood sugars before meals three times a day. Take Novolog 4 units with meals three times.    COMMUNITY REFERRALS UPON DISCHARGE:    Outpatient: PT, OT, SP  Agency:[facility] ZJIRC:[phone] Date of Last Service:05/17/2014  Appointment Date/Time:NOVEMBER 4 Wednesday  9;00-11;45 AM  Medical Equipment/Items Ordered:HAS FROM PREVIOUS ADMITS   Other:DISABILITY APPLICATION PENDING  GENERAL COMMUNITY RESOURCES FOR PATIENT/FAMILY: Support Groups:[facility] SUPPORT GROUP   My questions have been answered and I understand these instructions. I will adhere to these goals and the provided educational materials after my discharge from the hospital.  Patient/Caregiver Signature _______________________________ Date __________  Clinician Signature _______________________________________ Date __________  Please bring this form and your medication list with you to all your follow-up doctor's appointments.

## 2014-05-16 NOTE — Progress Notes (Addendum)
Physical Therapy Discharge Summary  Patient Details  Name: Adam Callahan MRN: [id number] Date of Birth: Dec 26, 1960  Today's Date: 05/17/2014 PT Individual Time: 3007-6226 and 1035-1100 PT Individual Time Calculation (min): 25 min and 25 min   Patient has met 9 of 10 long term goals due to improved balance, improved postural control, increased strength and functional use of  right lower extremity.  Patient to discharge at an ambulatory level [place].   Patient's care partner is independent to provide the necessary physical and cognitive assistance at discharge.  Reasons goals not met: Goal addressing basic transfers with supervision not fully met, as pt requires min guard-min A for pivoting to get to/from bed<>chair once standing.  Recommendation:  Patient will benefit from ongoing skilled PT services in home health setting to continue to advance safe functional mobility, address ongoing impairments in stability and independence with functional mobility, and minimize fall risk.  Equipment: R knee ankle foot orthotic  Reasons for discharge: treatment goals met and discharge from hospital  Patient/family agrees with progress made and goals achieved: Yes  Skilled Therapeutic Interventions/Progress Updates Treatment Session 1: Pt received seated EOM, finishing breakfast. Session planned to focus on hands-on family training with primary caregiver (ex-wife, [name]); however, no family present. Therefore, assisted pt in getting dressed, donning R KAFO. In gym, explained and demonstrated floor transfer. Pt gave effective return demonstration of floor transfer with supervision and mod cueing for technique. Pt engaged in interactive discussion of the following:  reasons for activating EMS after fall, symptoms of stroke. Session ended in pt room, where pt was left seated in w/c with quick release belt in place for safety and all needs within reach. Notified CSW Jacqlyn Larsen of no family during family  education session.  Treatment Session 2: Pt received seated in w/c accompanied by ex-wife [place] and orthotist. Orthotist completing education on donning/doffing R KAFO; [name] verbalized understanding. Current session focused on completion of hands-on training with caregiver. Therapist explained and demonstrated the following: gait x150' in controlled and home environments with min A, R HHA; negotiation of 2 stairs without rails with min A, L HHA; simulated car transfer with min A and mod cueing for safety and hand placement. Caregiver initially required max encouragement to provide hands-on assist with functional mobility. Caregiver also required 2 trials for stair negotiation as well as car transfer to ensure safe technique for assisting as well as appropriate cueing.   PT educated caregiver on assistance required with all aspects of functional mobility upon discharge home. Emphasis given to strong recommendation that pt requires 24/7 supervision and hands-on assistance with all standing and ambulation. Explained very high fall risk due to cognitive impairments and decreased stability with functional mobility. Caregiver, [name], verbalized understanding and was full agreement with discharge plan and all education provided. Pt and caregiver verbalized feeling safe and comfortable performing/assisting with all aspects of functional mobility at this time.  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 Restrictions Weight Bearing Restrictions: No Vital Signs Therapy Vitals Temp: 97.5 F (36.4 C) Temp Source: Oral Pulse Rate: 85 Resp: 18 BP: 122/81 mmHg Patient Position (if appropriate): Lying Oxygen Therapy SpO2: 100 % O2 Device: Not Delivered Pain Pain Assessment Pain Assessment: No/denies pain Cognition Overall Cognitive Status: History of cognitive impairments - at baseline Arousal/Alertness: Awake/alert Orientation  Level: Oriented X4 Attention: Selective Selective Attention: Impaired Selective Attention Impairment: Functional basic;Verbal basic Memory: Impaired Memory Impairment: Decreased recall of new information;Decreased short term memory Decreased  Short Term Memory: Verbal basic;Functional basic Awareness: Impaired Awareness Impairment: Emergent impairment Problem Solving: Impaired Problem Solving Impairment: Functional basic;Verbal basic Behaviors: Perseveration;Lability Safety/Judgment: Impaired Sensation Sensation Light Touch: Impaired Detail Light Touch Impaired Details: Impaired RLE Proprioception: Impaired Detail Proprioception Impaired Details: Impaired RLE Additional Comments: Light touch and proprioception impaired in RLE. Coordination Gross Motor Movements are Fluid and Coordinated: No Fine Motor Movements are Fluid and Coordinated: No Motor  Motor Motor: Hemiplegia;Abnormal tone;Abnormal postural alignment and control Motor - Discharge Observations: R hemiplegia; impaired selective control and grading of RLE movements Mobility Bed Mobility Bed Mobility: Supine to Sit;Sit to Supine Supine to Sit: HOB flat;5: Supervision Sit to Supine: 5: Supervision;HOB flat Transfers Transfers: Yes Sit to Stand: 5: Supervision Sit to Stand Details: Verbal cues for technique Sit to Stand Details (indicate cue type and reason): verbal cueing to perform transfer without UE use Stand to Sit: 5: Supervision Stand to Sit Details (indicate cue type and reason): Verbal cues for technique Stand to Sit Details: verbal cueing to perform transfer without UE use Squat Pivot Transfers: 5: Supervision Squat Pivot Transfer Details: Verbal cues for technique Squat Pivot Transfer Details (indicate cue type and reason): Verbal cueing for setup, technique Locomotion  Ambulation Ambulation: Yes Ambulation/Gait Assistance: 4: Min assist Ambulation Distance (Feet): 175 Feet Assistive device: 1 person  hand held assist Ambulation/Gait Assistance Details: Verbal cues for gait pattern;Tactile cues for weight shifting Gait Gait: Yes Gait Pattern: Impaired Gait Pattern: Decreased stance time - right;Decreased weight shift to right;Decreased trunk rotation;Lateral trunk lean to left;Decreased dorsiflexion - right;Right genu recurvatum;Trunk rotated posteriorly on right Gait velocity: decreased Stairs / Additional Locomotion Stairs: Yes Stairs Assistance: 4: Min assist Stair Management Technique: One rail Left;Step to pattern;Forwards Number of Stairs: International aid/development worker: Yes Wheelchair Assistance: 5: Supervision Wheelchair Propulsion: Left upper extremity;Left lower extremity Wheelchair Parts Management: Needs assistance Distance: 200  Trunk/Postural Assessment  Cervical Assessment Cervical Assessment: Within Functional Limits Thoracic Assessment Thoracic Assessment: Within Functional Limits Lumbar Assessment Lumbar Assessment: Within Functional Limits Postural Control Postural Control: Deficits on evaluation Righting Reactions: With posterior LOB, hip strategy intact but no R ankle or stepping strategies. With anterior LOB, hiip and stepping strategy intact but R ankle strategy delayed/ineffective.  Balance Balance Balance Assessed: Yes Dynamic Standing Balance Dynamic Standing - Balance Support: No upper extremity supported Dynamic Standing - Level of Assistance: 5: Stand by assistance;4: Min assist Dynamic Standing - Balance Activities: Reaching for objects Extremity Assessment  RLE Assessment RLE Assessment: Exceptions to [facility] RLE Strength RLE Overall Strength: Deficits;Due to premorbid status RLE Overall Strength Comments: 4-/5 hip flexion; 3-/5 knee flexion/ankle dorsiflexion; knee extension/ankle plantarflexion grossly WFL LLE Assessment LLE Assessment: Within Functional Limits  Addendum: date  See FIM for current functional status  Pacey Altizer,  Malva Cogan 05/17/2014, 12:26 PM

## 2014-05-17 ENCOUNTER — Ambulatory Visit (HOSPITAL_COMMUNITY): Payer: BC Managed Care – PPO | Admitting: Physical Therapy

## 2014-05-17 ENCOUNTER — Encounter (HOSPITAL_COMMUNITY): Payer: Self-pay | Admitting: Speech Pathology

## 2014-05-17 LAB — GLUCOSE, CAPILLARY: Glucose-Capillary: 188 mg/dL — ABNORMAL HIGH (ref 70–99)

## 2014-05-17 NOTE — Progress Notes (Signed)
Social Work Discharge Note Discharge Note  The overall goal for the admission was met for:   Discharge location: Yes-HOME WITH [name] PROVIDING 24 HR CARE, REV WILLIAMS ALSO INVOLVED IN PT'S CARE  Length of Stay: Yes-13 DAYS  Discharge activity level: Yes-MIN LEVEL  Home/community participation: Yes  Services provided included: MD, RD, PT, OT, SLP, RN, CM, TR, Pharmacy and SW  Financial Services: Private Insurance: [place] BC[employer]  Follow-up services arranged: Outpatient: [facility],OT,SP 11/4 9;00-11;45 AM  Comments (or additional information):[name] TO BE WITH HIM 24 HR AND PROVIDE CARE.  DAUGHTER OFFERED FOR HIM TO COME TO [facility] BUT HE DECLINED.  HE WANTS TO BE IN HIS HOME. HAS ALL DME FROM PREVIOUS ADMIT LAST YEAR.  DISABILITY PENDING AND STD PAPERS COMPLETED, GIVEN TO [name]. STILL CONCERNS OVER PT'S CARE AND NOT BEING TAKEN ADVANTAGE OF.  Patient/Family verbalized understanding of follow-up arrangements: Yes  Individual responsible for coordination of the follow-up plan: [name]-EX-WIFE, REV WILLIAMS & CEJIE-DAUGHTER  Confirmed correct DME delivered: Dupree, Rebecca G 05/17/2014    Dupree, Rebecca G 

## 2014-05-17 NOTE — Progress Notes (Signed)
Speech Language Pathology Discharge Summary  Patient Details  Name: Adam Callahan MRN: [id number] Date of Birth: September 05, 1960  Cancellation:  SLP attempted to see pt for family education prior to discharge.  Pt's family not present for training.  As a result, pt missed 30 minutes of skilled ST.    Patient has met 4 of 4 long term goals.  Patient to discharge at overall Mod;Max level.   Clinical Impression/Discharge Summary:  Pt made slow gains while inpatient and is discharging having met 4 out of 4 long term goals due to improved attention, awareness, problem solving, and recall of basic daily information.  Pt currently requires mod-max assist for basic cognitive-linguistic tasks due to poor carryover of new information, decreased mental flexibility,and perseveration.  Pt's progress has also been limited by emotional lability during therapy sessions and poor topic maintenance to where he requires frequent redirection to structured tasks.  Pt is discharging home with 24/7 supervision from family, although pt's ex-wife [name] was not present for family education.  Brief education was completed with pt's daughter during yesterday's therapy session including recommendation for assistance for medication and financial management; however, recommend further education at next level of care regarding cognitive remediation/compensation and need for 24/7 supervision.  Pt would benefit from skilled ST follow up upon discharge to maximize functional independence and reduce burden of care upon discharge.   Care Partner:  Caregiver Able to Provide Assistance: Yes  Type of Caregiver Assistance: Physical;Cognitive  Recommendation:  Outpatient SLP;24 hour supervision/assistance  Rationale for SLP Follow Up: Maximize cognitive function and independence;Reduce caregiver burden   Equipment: none recommended by SLP    Reasons for discharge: Discharged from hospital   Patient/Family Agrees with Progress Made and  Goals Achieved: Yes   See FIM for current functional status  [name], M.A. CCC-SLP  [name], Selinda Orion 05/17/2014, 4:03 PM

## 2014-05-17 NOTE — Progress Notes (Signed)
Pt was discharged by [facility]) in-patient rehab. Pt escorted off unit by Elmyra Ricks (NA) and pts spouse.

## 2014-05-17 NOTE — Progress Notes (Signed)
Subjective/Complaints:  Pt talking about prior CVA and injury at work  Objective: Vital Signs: Blood pressure 129/68, pulse 80, temperature 98 F (36.7 C), temperature source Oral, resp. rate 18, weight 75.932 kg (167 lb 6.4 oz), SpO2 100.00%. No results found. Results for orders placed during the hospital encounter of 05/04/14 (from the past 72 hour(s))  GLUCOSE, CAPILLARY     Status: Abnormal   Collection Time    05/14/14 11:41 AM      Result Value Ref Range   Glucose-Capillary 103 (*) 70 - 99 mg/dL  GLUCOSE, CAPILLARY     Status: Abnormal   Collection Time    05/14/14  4:55 PM      Result Value Ref Range   Glucose-Capillary 143 (*) 70 - 99 mg/dL  GLUCOSE, CAPILLARY     Status: Abnormal   Collection Time    05/14/14  8:46 PM      Result Value Ref Range   Glucose-Capillary 121 (*) 70 - 99 mg/dL  GLUCOSE, CAPILLARY     Status: Abnormal   Collection Time    05/15/14  6:58 AM      Result Value Ref Range   Glucose-Capillary 159 (*) 70 - 99 mg/dL  GLUCOSE, CAPILLARY     Status: Abnormal   Collection Time    05/15/14 11:35 AM      Result Value Ref Range   Glucose-Capillary 123 (*) 70 - 99 mg/dL  GLUCOSE, CAPILLARY     Status: Abnormal   Collection Time    05/15/14  4:44 PM      Result Value Ref Range   Glucose-Capillary 129 (*) 70 - 99 mg/dL  GLUCOSE, CAPILLARY     Status: Abnormal   Collection Time    05/15/14  8:43 PM      Result Value Ref Range   Glucose-Capillary 174 (*) 70 - 99 mg/dL  GLUCOSE, CAPILLARY     Status: Abnormal   Collection Time    05/16/14  7:01 AM      Result Value Ref Range   Glucose-Capillary 144 (*) 70 - 99 mg/dL  GLUCOSE, CAPILLARY     Status: Abnormal   Collection Time    05/16/14 11:31 AM      Result Value Ref Range   Glucose-Capillary 115 (*) 70 - 99 mg/dL  GLUCOSE, CAPILLARY     Status: Abnormal   Collection Time    05/16/14  4:43 PM      Result Value Ref Range   Glucose-Capillary 151 (*) 70 - 99 mg/dL  GLUCOSE, CAPILLARY      Status: Abnormal   Collection Time    05/16/14  8:51 PM      Result Value Ref Range   Glucose-Capillary 169 (*) 70 - 99 mg/dL  GLUCOSE, CAPILLARY     Status: Abnormal   Collection Time    05/17/14  8:00 AM      Result Value Ref Range   Glucose-Capillary 188 (*) 70 - 99 mg/dL      Physical Exam  Constitutional: He is oriented to person, place, and time. He appears well-developed.  HENT:  Right facial droop  Eyes: EOM are normal.  Neck: Normal range of motion. Neck supple. No thyromegaly present.  Cardiovascular: Normal rate and regular rhythm.  Respiratory: Effort normal and breath sounds normal. No respiratory distress.  GI: Soft. Bowel sounds are normal. He exhibits no distension.  Neurological: He is alert and oriented to person, place, and time. Exp>recept aphasia Speech is  dysarthric but intelligible. He does follow simple commands. Fair awareness of his deficits. RUE 2/5 prox to distal.. Has difficulty with attention and sequencing. LUE and LLE 4-5/5.  Decreased sensory to LT on Right face, arm, leg. Fair sitting balance Skin: Skin is warm and dry.  Psychiatric:emotional lability   Assessment/Plan: 1. 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, prior left PCA infarct  Stable for D/C today F/u PCP in 1-2 weeks F/u PM&R 3 weeks See D/C summary See D/C instructions  FIM: FIM - Bathing Bathing Steps Patient Completed: Chest;Right Arm;Abdomen;Front perineal area;Right upper leg;Left upper leg;Left lower leg (including foot);Right lower leg (including foot);Buttocks;Left Arm Bathing: 5: Supervision: Safety issues/verbal cues  FIM - Upper Body Dressing/Undressing Upper body dressing/undressing steps patient completed: Thread/unthread left sleeve of pullover shirt/dress;Put head through opening of pull over shirt/dress;Thread/unthread right sleeve of pullover shirt/dresss;Pull shirt over trunk Upper body dressing/undressing: 5:  Supervision: Safety issues/verbal cues FIM - Lower Body Dressing/Undressing Lower body dressing/undressing steps patient completed: Thread/unthread right pants leg;Thread/unthread left pants leg;Don/Doff left shoe;Fasten/unfasten left shoe;Pull pants up/down;Fasten/unfasten pants Lower body dressing/undressing: 4: Min-Patient completed 75 plus % of tasks  FIM - Toileting Toileting steps completed by patient: Adjust clothing after toileting Toileting: 3: Mod-Patient completed 2 of 3 steps  FIM - Radio producer Devices: Bedside commode;Grab bars Toilet Transfers: 4-To toilet/BSC: Min A (steadying Pt. > 75%);4-From toilet/BSC: Min A (steadying Pt. > 75%)  FIM - Bed/Chair Transfer Bed/Chair Transfer Assistive Devices: Arm rests Bed/Chair Transfer: 5: Supine > Sit: Supervision (verbal cues/safety issues);5: Sit > Supine: Supervision (verbal cues/safety issues);4: Bed > Chair or W/C: Min A (steadying Pt. > 75%);4: Chair or W/C > Bed: Min A (steadying Pt. > 75%)  FIM - Locomotion: Wheelchair Distance: 200 Locomotion: Wheelchair: 5: Travels 150 ft or more: maneuvers on rugs and over door sills with supervision, cueing or coaxing FIM - Locomotion: Ambulation Locomotion: Ambulation Assistive Devices: Other (comment);Orthosis (R KAFO; R HHA) Ambulation/Gait Assistance: 4: Min assist Locomotion: Ambulation: 4: Travels 150 ft or more with minimal assistance (Pt.>75%)  Comprehension Comprehension Mode: Auditory Comprehension: 3-Understands basic 50 - 74% of the time/requires cueing 25 - 50%  of the time  Expression Expression Mode: Verbal Expression: 4-Expresses basic 75 - 89% of the time/requires cueing 10 - 24% of the time. Needs helper to occlude trach/needs to repeat words.  Social Interaction Social Interaction: 4-Interacts appropriately 75 - 89% of the time - Needs redirection for appropriate language or to initiate interaction.  Problem Solving Problem  Solving: 3-Solves basic 50 - 74% of the time/requires cueing 25 - 49% of the time  Memory Memory: 3-Recognizes or recalls 50 - 74% of the time/requires cueing 25 - 49% of the time  Medical Problem List and Plan:  1. 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 DVT Prophylaxis/Anticoagulation: Subcutaneous heparin. Monitor platelet counts any signs of bleeding  3. Pain Management: Ultram as needed. Monitor with increased mobility  4. Diabetes mellitus with peripheral neuropathy. Hemoglobin A1c 11.3. NovoLog 4 units 3 times a day, Lantus insulin 20 units daily.   -good control at present 5. Neuropsych: This patient is not capable of making decisions on his own behalf.  6. Skin/Wound Care: Routine skin checks  7. Fluids/Electrolytes/Nutrition: Strict I and O.'s.  Eating well  8. Hyperlipidemia. Lipitor  9. Poor medical compliance due to financial constraints. Needs Int med  f/u post d/c    LOS (Days) 13 A FACE TO FACE EVALUATION WAS PERFORMED  Adam Callahan E 05/17/2014, 8:39 AM

## 2014-05-25 ENCOUNTER — Ambulatory Visit: Payer: BC Managed Care – PPO

## 2014-05-25 ENCOUNTER — Ambulatory Visit: Payer: BC Managed Care – PPO | Admitting: Occupational Therapy

## 2014-05-25 ENCOUNTER — Encounter: Payer: Self-pay | Admitting: Occupational Therapy

## 2014-05-25 ENCOUNTER — Ambulatory Visit: Payer: BC Managed Care – PPO | Attending: Physical Medicine & Rehabilitation

## 2014-05-25 DIAGNOSIS — R41841 Cognitive communication deficit: Secondary | ICD-10-CM | POA: Insufficient documentation

## 2014-05-25 DIAGNOSIS — I639 Cerebral infarction, unspecified: Secondary | ICD-10-CM

## 2014-05-25 DIAGNOSIS — R471 Dysarthria and anarthria: Secondary | ICD-10-CM | POA: Diagnosis not present

## 2014-05-25 DIAGNOSIS — R269 Unspecified abnormalities of gait and mobility: Secondary | ICD-10-CM | POA: Insufficient documentation

## 2014-05-25 DIAGNOSIS — I69320 Aphasia following cerebral infarction: Secondary | ICD-10-CM | POA: Insufficient documentation

## 2014-05-25 DIAGNOSIS — G8110 Spastic hemiplegia affecting unspecified side: Secondary | ICD-10-CM | POA: Diagnosis not present

## 2014-05-25 DIAGNOSIS — R4701 Aphasia: Secondary | ICD-10-CM

## 2014-05-25 NOTE — Therapy (Signed)
Speech Language Pathology Evaluation  Patient Details  Name: GUILLAUME WENINGER MRN: [id number] Date of Birth: 12/16/1960  Encounter Date: 05/25/2014      End of Session - 05/25/14 1150    Visit Number 1   Number of Visits 16   Date for OT Re-Evaluation 07/25/14   SLP Start Time 1021   SLP Stop Time 1103   SLP Time Calculation (min) 42 min   Activity Tolerance Patient tolerated treatment well;Other (comment)  was initially slighlty resistant to SLP      Past Medical History  Diagnosis Date  . Diabetes mellitus   . Hypertension     Past Surgical History  Procedure Laterality Date  . Tee without cardioversion N/A 05/03/2014    Procedure: TRANSESOPHAGEAL ECHOCARDIOGRAM (TEE);  Surgeon: Pixie Casino, MD;  Location: [facility] ENDOSCOPY;  Service: Cardiovascular;  Laterality: N/A;    There were no vitals taken for this visit.  Visit Diagnosis: Aphasia - Plan: SLP PLAN OF CARE CERT/RE-CERT  Cognitive communication deficit - Plan: SLP PLAN OF CARE CERT/RE-CERT  Dysarthria - Plan: SLP PLAN OF CARE CERT/RE-CERT      Subjective Assessment - 05/25/14 1028    Symptoms "Nothing wrong with my speech - I gotta get up and walking again."   Currently in Pain? No/denies   Multiple Pain Sites No          SLP Evaluation [facility] - 05/25/14 1028    SLP Visit Information   SLP Received On 05/25/14   Onset Date 04-29-14   Medical Diagnosis CVA   Subjective   Subjective "nothing wrong with my speech. I just need to get my hand and my leg straight"   Patient/Family Stated Goal walking, rt leg   General Information   Behavioral/Cognition Pt denies anything wrong with speech or cognition. [name] (ex-wife) states pt's speech worse as well as memory worse.   Mobility Status arrived in wheelchair   Prior Functional Status   Cognitive/Linguistic Baseline Baseline deficits   Baseline deficit details Previous left PCA stroke with residual dysarthria and word finding deficits, as well as  cognitive changes.    Lives With --  Ex-wife   Available Support Friend(s)   Education 12 grade, but states he doesn't read well.   Vocation --  applied for disability   Pain Assessment   Pain Assessment No/denies pain   Cognition   Overall Cognitive Status Impaired/Different from baseline   Area of Impairment Memory;Awareness  Awareness may be more personality than a awareness defiicit   Memory Decreased short-term memory   Memory Comments recalled average 3 items in a paragraph ([place]). Pt stated he would have recalled more ([name] agrees) prior to latest CVA   Attention Divided;Alternating   Alternating Attention Impaired   Alternating Attention Impairment Functional basic;Functional complex   Divided Attention Impaired   Divided Attention Impairment Functional basic;Functional complex;Verbal basic;Verbal complex   Memory Impaired   Memory Impairment Storage deficit;Retrieval deficit;Decreased short term memory;Prospective memory   Decreased Short Term Memory Verbal basic;Verbal complex;Functional basic;Functional complex   Awareness --  Questionable anticipatory awareness   Executive Function --  suspect this may be complicated by reduced  memory skills   Behaviors Lability  Reported lability   Reading Comprehension   Reading Status Not tested  pt with sub-average reading level for 12th grade education   Verbal Expression   Overall Verbal Expression Impaired   Level of Generative/Spontaneous Verbalization Conversation;Sentence   Divergent 75-100% accurate   Other Naming Comments mild word  finding impairments in conversation   pt not utilizing compensations   Interfering Components Attention;Premorbid deficit   Motor Speech   Overall Motor Speech Impaired at baseline   Respiration Impaired   Level of Impairment Conversation   Phonation Low vocal intensity   Resonance Within functional limits   Articulation Impaired   Level of Impairment Conversation    Intelligibility Intelligibility reduced   Conversation 75-100% accurate  approx 90%   Motor Planning Witnin functional limits   Interfering Components Premorbid status   Effective Techniques Increased vocal intensity;Over-articulate   Phonation WFL   Standardized Assessments   Standardized Assessments  --  component of [place] (paragraph memory)   Assessment   Therapy Diagnosis Cognitive Impairments;Dysarthria;Aphasia   Clinical Impression Statement Presents today with baseline impaired cognitive-linguistics (memory), dysarthria, and expressive aphasia. However, pt's anomia appears worse than previous thearpy course with this SLP. Pt's ex-wife tells SLP that pt's short term memory is worse than premorbid status, and SLP agrees. Ex-wife currently in charge of pt meds, appointments, and she cues pt for details re: recent and future events and recent conversations.Marland Kitchen   SLP Recommendation/Assessment Patient needs continued Speech Lanaguage Pathology Services   Problem List Verbal expression;Memory  speech intelligibilty   Plan   Speech Therapy Frequency min 2x/week   Duration --  8 weeks   Treatment/Interventions Language facilitation;Cueing hierarchy;Internal/external aids;Functional tasks;SLP instruction and feedback;Compensatory strategies;Patient/family education   Potential to Achieve Goals Good   Potential Considerations Ability to learn/carryover information;Cooperation/participation level;Previous level of function   SLP Recommendations   Follow up Recommendations None   Individuals Consulted   Consulted and Agree with Results and Recommendations Patient;Family member/caregiver   Family Member Consulted  Ex wife            Education - 05/25/14 1149    Education provided Yes   Education Details memory log rationale and how-to, compensations for times for medication administration   Education Details Patient;Caregiver(s)   Methods Explanation   Comprehension Verbalized  understanding;Need further instruction          SLP Short Term Goals - 05/25/14 1351    SLP SHORT TERM GOAL #1   Title Pt will use compensations in 8/10 sentence responses with occasional min A   Time 4   Period Weeks   Status New   SLP SHORT TERM GOAL #2   Title Pt will utilize memory compensation system (memory log) for recall of past events with occasional min A   Time 4   Period Weeks   Status New   SLP SHORT TERM GOAL #3   Title Pt will complete simple naming tasks with occasional min A   Time 4   Period Weeks   Status New   SLP SHORT TERM GOAL #4   Title Pt will use attempt compensations for anomia in conversation with rare min A to do so   Time 4   Period Weeks   Status New          SLP Long Term Goals - 05/25/14 1410    SLP LONG TERM GOAL #1   Title Pt will utilize memory compensation system (memory log) for recall of past events with rare min A   Time 8   Period Weeks   Status New   SLP LONG TERM GOAL #2   Title Pt will use attempt compensations for anomia in conversation with rare min A to do so   Time 8   Period Weeks   Status New  SLP LONG TERM GOAL #3   Title Pt will exhibit >90% intelligibility in min-mod complex conversation   Time 8   Period Weeks   Status New   SLP LONG TERM GOAL #4   Title Pt will demo knowledge when caregiver to give him meds with modified independence (external reminder)   Time 8   Period Weeks   Status New   SLP LONG TERM GOAL #5   Title LTG number one: Pt will utilize memory compensation system (memory log) for recall of past events with rare min A   Time 8   Period Weeks   Status New          Plan - 05/25/14 1151    Clinical Impression Statement Presents today with baseline impaired cognitive-linguistics (memory), dysarthria, and expressive aphasia. However, pt's anomia appears worse than previous thearpy course with this SLP. Pt's ex-wife tells SLP that pt's short term memory is worse than premorbid status, and  SLP agrees. Ex-wife currently in charge of pt meds, appointments, and she cues pt for details re: recent and future events and recent conversations.Marland Kitchen   Speech Therapy Frequency min 2x/week   Duration --  8 weeks   Treatment/Interventions Compensatory strategies;Internal/external aids;Environmental Environmental consultant;Other (comment);SLP instruction and feedback;Functional tasks   Potential to Achieve Goals Good   Potential Considerations Ability to learn/carryover information;Cooperation/participation level;Previous level of function        Problem List Patient Active Problem List   Diagnosis Date Noted  . Acute left hemiparesis 05/04/2014  . Stroke 04/30/2014  . Spastic hemiplegia affecting dominant side 10/01/2013  . Aphasia as late effect of cerebrovascular accident 10/01/2013  . 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 Dehydration 06/07/2013  . Hypokalemia 06/07/2013  . Dyslipidemia 06/07/2013  . CVA (cerebral infarction) 06/06/2013  . Diabetes mellitus type 2, uncontrolled 06/06/2013  . Rhabdomyolysis 06/06/2013                                            [facility] 05/25/2014, 3:22 PM

## 2014-05-25 NOTE — Therapy (Signed)
Occupational Therapy Evaluation  Patient Details  Name: Adam Callahan MRN: [id number] Date of Birth: 1960-12-21  Encounter Date: 05/25/2014      OT End of Session - 05/25/14 1218    Visit Number 1   Date for OT Re-Evaluation 07/20/14   OT Start Time 1108   OT Stop Time 1155   OT Time Calculation (min) 47 min   OT Charge Details OT eval and treatment   Activity Tolerance Patient tolerated treatment well      Past Medical History  Diagnosis Date  . Diabetes mellitus   . Hypertension     Past Surgical History  Procedure Laterality Date  . Tee without cardioversion N/A 05/03/2014    Procedure: TRANSESOPHAGEAL ECHOCARDIOGRAM (TEE);  Surgeon: Pixie Casino, MD;  Location: [facility] ENDOSCOPY;  Service: Cardiovascular;  Laterality: N/A;    There were no vitals taken for this visit.  Visit Diagnosis:  CVA (cerebral vascular accident) - Plan: Ot plan of care cert/re-cert      Subjective Assessment - 05/25/14 1110    Symptoms Pt presents today in out pt Neuro Rehab s/o CVA on 04/29/14 and was in-pt rehab unit and was d/c on 05/17/14. Pt is RHD and presents w/ RUE weakness.   Patient Stated Goals "I want to get well, I want to use that right hand b/c I draw, I want that."    Currently in Pain? No/denies   Pain Score 0-No pain   Multiple Pain Sites No          [facility] OT Assessment - 05/25/14 1100    Home  Environment   Family/patient expects to be discharged to: Private residence   Living Arrangements Other(Comment)  Pt's ex-wife currently staying with him   Available Help at Discharge Other (Comment)  Ex-wife   Type of [place] One level   Bathroom Shower/Tub Tub/Shower unit  Chart review indicates tub bench, pt/ex-wife report chair   Bathroom Accessibility Yes   How accessible Other (Comment)  Pt uses w/c to bathroom door & then quad cane into bathroom   Home Equipment Shower seat  Chart reivew indicates tub bench, pt has chair    Lives With Other (Comment)  Lives alone, ex-wife currently staying w/ pt   Prior Function   Level of Independence Needs assistance with ADLs;Needs assistance with homemaking;Needs assistance with gait;Needs assistance with transfers   Level of Independence - Bath Minimal  bathing LUE, left side   Toileting Minimal  Clothing management, fasteners   Dressing Minimal  don/doffing, fasteners, tying shoes etc   Grooming Minimal  Assist opening containers, shaving etc   ADL   Eating/Feeding Minimal assistance   Grooming Modified independent   Grooming Patient Percentage --  Pt is using L hand only, not using RUE   Upper Body Bathing Minimal assistance   Lower Body Bathing Minimal assistance   Upper Body Dressing Simulated;Minimal assistance   Lower Body Dressing Simulated;Moderate assistance   Toilet Tranfer Simulated;Minimal assistance   Toileting - Clothing Manipulation Simulated;Minimal assistance   Toileting - Clothing Manipulation Details --  Min A for fasteners and pulling clothing up/down   Tub/Shower Transfer Minimal assistance   ADL comments --  Pt currently requires Min A for bahting/dress/grooming    Written Expression   Dominant Hand Right   Sensation   Light Touch Impaired by gross assessment  Impaired, unable to feel light touch   Hot/Cold Impaired by gross assessment  Coordination   Gross Motor Movements are Fluid and Coordinated --  Impaired   Fine Motor Movements are Fluid and Coordinated --  Impaired   Box and Blocks --  Unable, impaired   Coordination --  Impaired, unable to perform AROM digits   Tone   Assessment Location Right Upper Extremity  Tone noted at shoulder, elbow   PROM   Overall PROM  Deficits  Due to tone RUE   RUE Tone   RUE Tone Moderate            Education - 05/25/14 1149    Education provided Yes   Education Details memory log rationale and how-to, compensations for times for medication administration   Education Details  Patient;Caregiver(s)   Methods Explanation   Comprehension Verbalized understanding;Need further instruction    The above education was provided by Speech therapy, OT education is available in Patient encounter.      OT Short Term Goals - 05/25/14 1229    OT SHORT TERM GOAL #1   Title Pt will be Mod I HEP RUE    Baseline Min A and vc's   Time 4   Period Weeks   Status New   OT SHORT TERM GOAL #2   Title Pt will be Mod I precautions realted to impaired sensation RUE   Baseline Pt requires VC's    Time 4   Period Weeks   Status New   OT SHORT TERM GOAL #3   Title Pt will be Mod I UB hemi dressing techniques seated.   Baseline Min A buttons etc   Time 4   Period Weeks   Status New   OT SHORT TERM GOAL #4   Title Pt will be Mod I gross grasp release w/ RUE in prep for increase dindependence with ADL's/self care tasks.   Baseline Min-Mod A    Time 4   Period Weeks   Status New          OT Long Term Goals - 05/25/14 1233    OT LONG TERM GOAL #1   Title Pt will be Mod I signs and symptoms of CVA   Time 8   Period Weeks   Status New   OT LONG TERM GOAL #2   Title Pt will be Mod I upgraded HEP    Time 8   Period Weeks   Status New   OT LONG TERM GOAL #3   Title Pt wil lbe Mod I UB/LB dressing sitting and sit to stand using a/e & hemi-dressing techniques PRN.   Time 8   Period Weeks   Status New   OT LONG TERM GOAL #4   Title Pt will use RUE as assist during ADL and functional use R dominant UE 75% of the time or greater for dressing and eating.   Time 8   Period Weeks   Status New          Plan - 05/25/14 1228    OT Frequency Min 2X/week   OT Duration 8 weeks   OT Treatment/Interventions Self-care/ADL training;Therapeutic exercise;Neuromuscular education;DME and/or AE instruction;Cognitive remediation/compensation;Therapeutic activities;Patient/family education;Balance training   OT Plan Review home ex program (PROM RUE) and uprade as able.         Problem List Patient Active Problem List   Diagnosis Date Noted  . Acute left hemiparesis 05/04/2014  . Stroke 04/30/2014  . Spastic hemiplegia affecting dominant side 10/01/2013  . Aphasia as late effect of cerebrovascular accident 10/01/2013  .  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 Dehydration 06/07/2013  . Hypokalemia 06/07/2013  . Dyslipidemia 06/07/2013  . CVA (cerebral infarction) 06/06/2013  . Diabetes mellitus type 2, uncontrolled 06/06/2013  . Rhabdomyolysis 06/06/2013                                             Percell Miller Beth Dixon 05/25/2014, 12:47 PM

## 2014-05-25 NOTE — Patient Instructions (Signed)
Begin to write down daily events in Memory Log, provided to you today. Set an alarm for med times so that [name] can begin to tell [name] when to give him his meds.

## 2014-05-25 NOTE — Patient Instructions (Addendum)
[name] were educated in decreased sensibility RUE and absent light touch. Pt was cautioned re: ADL's, self care tasks. He and his ex-wife verbalized understanding of this, he will need reinforcement of this secondary to decreased cognition.Flexion (Passive)   Use other hand to bend elbow, with thumb toward same shoulder. Do NOT force this motion. Hold 10__ seconds. Repeat __10__ times. Do _2-3___ sessions per day.  Copyright  VHI. All rights reserved.  Extension (Passive)   Place thick telephone book on table and rest upper arm on it. Grasp forearm with other hand and use a steady downward and outward pull to straighten elbow. Hold 10__ seconds. Repeat __5-10__ times. Do 2-3__ sessions per day. Activity: Swing object, such as briefcase, to straighten elbow.*  Copyright  VHI. All rights reserved.  Flexion (Passive)   Sitting upright, slide forearm forward along table, bending from the waist until a stretch is felt. Hold _10___ seconds. Repeat __5-10__ times. Do _2-3___ sessions per day.  Copyright  VHI. All rights reserved.  Extension (Passive)   Rest arm straight out behind you on back of chair and lean forward until a stretch is felt. Hold _10___ seconds. Repeat 5-10__ times. Do _2-3___ sessions per day.  Copyright  VHI. All rights reserved.

## 2014-06-06 ENCOUNTER — Ambulatory Visit (HOSPITAL_BASED_OUTPATIENT_CLINIC_OR_DEPARTMENT_OTHER): Payer: BC Managed Care – PPO | Admitting: Physical Medicine & Rehabilitation

## 2014-06-06 ENCOUNTER — Encounter: Payer: BC Managed Care – PPO | Attending: Physical Medicine & Rehabilitation

## 2014-06-06 ENCOUNTER — Encounter: Payer: Self-pay | Admitting: Physical Medicine & Rehabilitation

## 2014-06-06 VITALS — BP 154/87 | HR 92 | Resp 14 | Wt 180.0 lb

## 2014-06-06 DIAGNOSIS — I69398 Other sequelae of cerebral infarction: Secondary | ICD-10-CM

## 2014-06-06 DIAGNOSIS — I69920 Aphasia following unspecified cerebrovascular disease: Secondary | ICD-10-CM | POA: Diagnosis not present

## 2014-06-06 DIAGNOSIS — I69898 Other sequelae of other cerebrovascular disease: Secondary | ICD-10-CM | POA: Insufficient documentation

## 2014-06-06 DIAGNOSIS — H53469 Homonymous bilateral field defects, unspecified side: Secondary | ICD-10-CM | POA: Insufficient documentation

## 2014-06-06 DIAGNOSIS — G8110 Spastic hemiplegia affecting unspecified side: Secondary | ICD-10-CM | POA: Diagnosis not present

## 2014-06-06 DIAGNOSIS — I69320 Aphasia following cerebral infarction: Secondary | ICD-10-CM

## 2014-06-06 NOTE — Patient Instructions (Signed)
Next visit is for Botox injection  Dr. Joseph Art is a family doctor that you can see for your general medical problems

## 2014-06-06 NOTE — Progress Notes (Signed)
Subjective:    [name], male    DOB: [birth year], 53 y.o.   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  Patient noncompliant with medications at home due to financial issues.  He lives alone, used a walker prior to admission.  Admitted on April 30, 2014, with blurred vision and headache.  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  MRA of the brain with chronic occlusion left PCA  DATE OF ADMISSION:  05/04/2014 DATE OF DISCHARGE:  05/17/2014  Still requiring some assistance for dressing Using 4 point cane at home No falls  Attending outpatient PT and OT speech. PT has not started yet.  Patient having bowel movement about every 4 days. Using fruits as well as yogurt to help with this. Pain Inventory Average Pain 3 Pain Right Now 2 My pain is other  In the last 24 hours, has pain interfered with the following? General activity 2 Relation with others 3 Enjoyment of life 2 What TIME of day is your pain at its worst? morning Sleep (in general) Good  Pain is worse with: some activites Pain improves with: n/a Relief from Meds: 8  Mobility walk with assistance use a cane ability to climb steps?  no do you drive?  no use a wheelchair needs help with transfers  Function not employed: date last employed . I need assistance with the following:  bathing, toileting, meal prep, household duties and shopping  Neuro/Psych bowel control problems trouble walking confusion depression anxiety  Prior Studies Any changes since last visit?  no  Physicians involved in your care Any changes since last visit?  no   Family History  Problem Relation Age of Onset  . Cervical cancer Mother   .  Hypertension Sister   . Diabetes Mellitus II Maternal Uncle    History   Social History  . Marital Status: Single    Spouse Name: N/A    Number of Children: N/A  . Years of Education: N/A   Social History Main Topics  . Smoking status: Former Research scientist (life sciences)  . Smokeless tobacco: None  . Alcohol Use: No  . Drug Use: No  . Sexual Activity: None   Other Topics Concern  . None   Social History Narrative   Past Surgical History  Procedure Laterality Date  . Tee without cardioversion N/A 05/03/2014    Procedure: TRANSESOPHAGEAL ECHOCARDIOGRAM (TEE);  Surgeon: Pixie Casino, MD;  Location: [facility] ENDOSCOPY;  Service: Cardiovascular;  Laterality: N/A;   Past Medical History  Diagnosis Date  . Diabetes mellitus   . Hypertension    BP 154/87 mmHg  Pulse 92  Resp 14  Wt 180 lb (81.647 kg)  SpO2 97%  Opioid Risk Score:   Fall Risk Score: High Fall Risk (>13 points) (educated and given handout on fall prevention in the home)  Review of Systems  Musculoskeletal: Positive for gait problem.  Psychiatric/Behavioral: Positive for confusion and dysphoric mood. The patient is nervous/anxious.   All other systems reviewed and are negative.      Objective:   Physical Exam Reduced sensation to light touch right arm and right leg Right field cut on confrontation testing  Motor strength is trace in the right elbow flexors and finger flexors are with excessive  tone MAS 3  Right lower 3 minus hip flexor and knee extensor Decreased motor control right lower Heel-to-shin with mild to moderate dysmetria right side, normal on left Motor strength 5/5 on the left upper and left lower extremity      Assessment & Plan:  1. 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 He was able to live alone after his first stroke. The majority of his current deficits are from the previous stroke. Over half of the 25 min visit was spent counseling and coordinating care.

## 2014-06-07 ENCOUNTER — Encounter: Payer: Self-pay | Admitting: Physical Therapy

## 2014-06-07 ENCOUNTER — Ambulatory Visit: Payer: BC Managed Care – PPO | Admitting: Physical Therapy

## 2014-06-07 DIAGNOSIS — G8111 Spastic hemiplegia affecting right dominant side: Secondary | ICD-10-CM

## 2014-06-07 DIAGNOSIS — G8110 Spastic hemiplegia affecting unspecified side: Secondary | ICD-10-CM

## 2014-06-07 DIAGNOSIS — I69320 Aphasia following cerebral infarction: Secondary | ICD-10-CM | POA: Diagnosis not present

## 2014-06-07 DIAGNOSIS — R269 Unspecified abnormalities of gait and mobility: Secondary | ICD-10-CM

## 2014-06-07 NOTE — Therapy (Signed)
Physical Therapy Evaluation  Patient Details  Name: Adam Callahan MRN: [id number] Date of Birth: [birth year]  Encounter Date: 06/07/2014      PT End of Session - 06/07/14 1500    Visit Number 1   Number of Visits 16   Date for PT Re-Evaluation 08/06/14   PT Start Time 6720   PT Stop Time 9470   PT Time Calculation (min) 40 min   Equipment Utilized During Treatment Gait belt  large based quad cane   Activity Tolerance Patient tolerated treatment well;Other (comment)   Behavior During Therapy WFL for tasks assessed/performed      Past Medical History  Diagnosis Date  . Diabetes mellitus   . Hypertension     Past Surgical History  Procedure Laterality Date  . Tee without cardioversion N/A 05/03/2014    Procedure: TRANSESOPHAGEAL ECHOCARDIOGRAM (TEE);  Surgeon: Pixie Casino, MD;  Location: [facility] ENDOSCOPY;  Service: Cardiovascular;  Laterality: N/A;    There were no vitals taken for this visit.  Visit Diagnosis:  Spastic hemiplegia affecting dominant side - Plan: PT PLAN OF CARE CERT/RE-CERT  Right spastic hemiparesis - Plan: PT PLAN OF CARE CERT/RE-CERT  Abnormality of gait - Plan: PT PLAN OF CARE CERT/RE-CERT      Subjective Assessment - 06/07/14 1319    Symptoms Pt is a 53 y/o male who presents to OPPT s/p CVA on 04/29/14.  Pt. hospitalized 04/29/14-05/17/14 including CIR admission.  Pt. presents today with residual R sided weakness, decreased memory, and decreased mobility.     Limitations Standing;Walking;House hold activities   Patient Stated Goals improve RLE strength, improve mobility and balance, negotiate stairs at ex-wife's home   Currently in Pain? No/denies          [facility] PT Assessment - 06/07/14 1324    Assessment   Medical Diagnosis L CVA   Onset Date 04/29/14   Next MD Visit 07/04/14   Prior Therapy CIR and previous CVA OP therapies   Precautions   Precautions Fall   Restrictions   Weight Bearing Restrictions No   Balance Screen   Has the  patient fallen in the past 6 months No   Has the patient had a decrease in activity level because of a fear of falling?  No   Is the patient reluctant to leave their home because of a fear of falling?  No   Home Environment   Living Enviornment Private residence   Living Arrangements Spouse/significant other  ex wife   Available Help at Discharge Family;Friend(s);Available 24 hours/day   Type of Home House   Home Access Stairs to enter   Entrance Stairs-Number of Steps 1   Entrance Stairs-Rails None   Home Layout One level   [place] - manual;Bedside commode;Shower seat   Prior Function   Level of Independence Requires assistive device for independence;Independent with homemaking with wheelchair   Cognition   Overall Cognitive Status Impaired/Different from baseline   Observation/Other Assessments   Focus on Therapeutic Outcomes (FOTO)  40  predicted 62   Strength   Right Hip Flexion 3/5   Left Hip Flexion 4/5   Right Knee Flexion 1/5   Right Knee Extension 3/5   Left Knee Flexion 5/5   Left Knee Extension 5/5   Right Ankle Dorsiflexion 1/5   Left Ankle Dorsiflexion 5/5   Transfers   Transfers Sit to Stand;Stand to Sit   Transfers Yes   Sit to Stand 5: Supervision;Without upper extremity assist;From chair/3-in-1  Sit to Stand Details (indicate cue type and reason) lean to left with decreased weight bearing on RLE   Stand to Sit 5: Supervision;Without upper extremity assist   Stand Pivot Transfers 4: Min assist   Stand Pivot Transfer Details (indicate cue type and reason) without device and cues for safety   Ambulation/Gait   Ambulation/Gait Assistance 4: Min assist   Ambulation Distance (Feet) 150 Feet   Assistive device Large base quad cane   Gait Pattern Decreased stance time - right;Decreased weight shift to right;Decreased trunk rotation;Lateral trunk lean to left;Decreased dorsiflexion - right;Trunk rotated posteriorly on right;Right  steppage   Gait velocity 0.67 ft/sec  48.69 sec   Berg Balance Test   Sit to Stand Able to stand without using hands and stabilize independently   Standing Unsupported Able to stand safely 2 minutes   Sitting with Back Unsupported but Feet Supported on Floor or Stool Able to sit safely and securely 2 minutes   Stand to Sit Controls descent by using hands   Transfers Needs one person to assist   Standing Unsupported with Eyes Closed Able to stand 10 seconds with supervision   Standing Ubsupported with Feet Together Able to place feet together independently and stand for 1 minute with supervision   From Standing, Reach Forward with Outstretched Arm Can reach forward >5 cm safely (2")   From Standing Position, Pick up Object from [place] to pick up shoe, needs supervision   From Standing Position, Turn to Look Behind Over each Shoulder Looks behind one side only/other side shows less weight shift   Turn 360 Degrees Needs assistance while turning   Standing Unsupported, Alternately Place Feet on Step/Stool Needs assistance to keep from falling or unable to try   Standing Unsupported, One Foot in Front Able to take small step independently and hold 30 seconds   Standing on One Leg Tries to lift leg/unable to hold 3 seconds but remains standing independently  33/56   Timed Up and Go Test   Normal TUG (seconds) 46.14   RUE Tone   RUE Tone Moderate            PT Education - 06/07/14 1500    Education provided No          PT Short Term Goals - 06/07/14 1505    PT SHORT TERM GOAL #1   Title independent with HEP (07/05/14)   Time 4   Period Weeks   Status New   PT SHORT TERM GOAL #2   Title improve BERG balance score to >/= 40/56 for improved balance (07/05/14)   Time 4   Period Weeks   Status New   PT SHORT TERM GOAL #3   Title improve gait velocity to >0.9 ft/sec with LRAD for improved mobility (07/05/14)   Time 4   Period Weeks   Status New   PT SHORT TERM GOAL #4    Title improve timed up and go to < 40 sec with LRAD for improved mobility (07/05/14)   Time 4   Period Weeks   Status New          PT Long Term Goals - 06/07/14 1506    PT LONG TERM GOAL #1   Title verbalize understanding of CVA risk factors/warning signs (08/02/14)   Time 8   Period Weeks   Status New   PT LONG TERM GOAL #2   Title improve BERG balance score to >/= 45/56 for decreased fall risk (08/02/14)  Time 8   Period Weeks   Status New   PT LONG TERM GOAL #3   Title negotiate stairs with LRAD with supervision for improved mobility and community access (08/02/14)   Time 8   Period Weeks   Status New   PT LONG TERM GOAL #4   Title improve gait velocity to > 1.31 ft/sec for improved functional mobility (08/02/14)   Time 8   Period Weeks   Status New   PT LONG TERM GOAL #5   Title improve timed up and go to < 35 sec with LRAD for improved mobility (08/02/14)   Time 8   Period Weeks   Status New          Plan - 06/07/14 1501    Clinical Impression Statement Pt presents to OPPT with residual R sided weakness affecting safe functional mobility in the home and community.  Will benefit from PT to maximize functional mobility and decrease fall risk.  Pt. presented today with small based quad cane missing one rubber stopper.  Reapplied rubber stopper however cane is not able to be set up for correct and safe pt use.  Recommend new small based quad cane for pt to safely mobilize at home.   Pt will benefit from skilled therapeutic intervention in order to improve on the following deficits Abnormal gait;Decreased coordination;Difficulty walking;Decreased safety awareness;Decreased endurance;Decreased activity tolerance;Decreased knowledge of precautions;Impaired tone;Decreased knowledge of use of DME;Decreased balance;Decreased cognition;Decreased mobility;Decreased strength;Impaired perceived functional ability   Rehab Potential Good   Clinical Impairments Affecting Rehab Potential  decreased memory and awareness of deficits   PT Frequency 2x / week   PT Duration 8 weeks   PT Treatment/Interventions ADLs/Self Care Home Management;Electrical Stimulation;Moist Heat;Cryotherapy;Balance training;DME Instruction;Manual techniques;Therapeutic exercise;Therapeutic activities;Patient/family education;Passive range of motion;Functional mobility training;Ultrasound;Stair training;Cognitive remediation;Gait training;Neuromuscular re-education   PT Next Visit Plan review CVA ed; HEP for lower extremity strengthening and gait with small based quad cane   Consulted and Agree with Plan of Care Patient        Problem List Patient Active Problem List   Diagnosis Date Noted  . Homonymous hemianopsia following cerebrovascular accident 06/06/2014  . Acute left hemiparesis 05/04/2014  . Stroke 04/30/2014  . Spastic hemiplegia affecting dominant side 10/01/2013  . Aphasia as late effect of cerebrovascular accident 10/01/2013  . 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 Dehydration 06/07/2013  . Hypokalemia 06/07/2013  . Dyslipidemia 06/07/2013  . CVA (cerebral infarction) 06/06/2013  . Diabetes mellitus type 2, uncontrolled 06/06/2013  . Rhabdomyolysis 06/06/2013                                         Laureen Abrahams, PT, DPT 06/07/2014 3:13 PM

## 2014-06-10 ENCOUNTER — Ambulatory Visit: Payer: BC Managed Care – PPO | Admitting: Physical Therapy

## 2014-06-10 ENCOUNTER — Encounter: Payer: Self-pay | Admitting: Physical Therapy

## 2014-06-10 ENCOUNTER — Ambulatory Visit: Payer: BC Managed Care – PPO

## 2014-06-10 DIAGNOSIS — R269 Unspecified abnormalities of gait and mobility: Secondary | ICD-10-CM

## 2014-06-10 DIAGNOSIS — G8110 Spastic hemiplegia affecting unspecified side: Secondary | ICD-10-CM

## 2014-06-10 DIAGNOSIS — I69320 Aphasia following cerebral infarction: Secondary | ICD-10-CM | POA: Diagnosis not present

## 2014-06-10 DIAGNOSIS — G8111 Spastic hemiplegia affecting right dominant side: Secondary | ICD-10-CM

## 2014-06-10 DIAGNOSIS — R4701 Aphasia: Secondary | ICD-10-CM

## 2014-06-10 NOTE — Patient Instructions (Signed)
  Keep using the alarms for helping you to remember you medication time.  Do your homework for all therapies - if you do it, it will help you get better faster.

## 2014-06-10 NOTE — Therapy (Signed)
Speech Language Pathology Treatment  Patient Details  Name: Adam Callahan MRN: [id number] Date of Birth: [birth year]  Encounter Date: 06/10/2014      End of Session - 06/10/14 1501    Visit Number 2   Number of Visits 16   SLP Start Time 1025   SLP Time Calculation (min) 1528   SLP Time Calculation (min) 41 min      Past Medical History  Diagnosis Date  . Diabetes mellitus   . Hypertension     Past Surgical History  Procedure Laterality Date  . Tee without cardioversion N/A 05/03/2014    Procedure: TRANSESOPHAGEAL ECHOCARDIOGRAM (TEE);  Surgeon: Pixie Casino, MD;  Location: [facility] ENDOSCOPY;  Service: Cardiovascular;  Laterality: N/A;    There were no vitals taken for this visit.  Visit Diagnosis: Aphasia  S: "My words aren't the same."  O: Divergent naming with mod-max A usually -pt generated average 2.5 items in simple categories prior to needing cues. He frequently named items (approx 1-2/category) that were already named. Pt stated, "It's on the tip of my tongue" x7 during tasks today, and req'd cues to think of how to compensate for anomic episodes.  Homework provided.         SLP Education - 06/10/14 1452    Education provided Yes   Education Details aphasia/anomia and therapy tasks today   [name];Caregiver(s)   Methods Explanation   Comprehension Verbalized understanding              Plan - 06/10/14 1521    Clinical Impression Statement Pt presents with cont impaired language and cognitiion skills. Cognition is likely negatively impacting language deficit.   Treatment/Interventions Compensatory strategies;Internal/external aids;Environmental Environmental consultant;Other (comment);SLP instruction and feedback;Functional tasks   Consulted and Agree with Plan of Care Patient        Problem List Patient Active Problem List   Diagnosis Date Noted  . Homonymous hemianopsia following cerebrovascular accident 06/06/2014   . Acute left hemiparesis 05/04/2014  . Stroke 04/30/2014  . Spastic hemiplegia affecting dominant side 10/01/2013  . Aphasia as late effect of cerebrovascular accident 10/01/2013  . 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 Dehydration 06/07/2013  . Hypokalemia 06/07/2013  . Dyslipidemia 06/07/2013  . CVA (cerebral infarction) 06/06/2013  . Diabetes mellitus type 2, uncontrolled 06/06/2013  . Rhabdomyolysis 06/06/2013                       Garald Balding, [place], [place] Phone: [phone] Fax: [phone]   06/10/2014, 3:23 PM

## 2014-06-10 NOTE — Therapy (Signed)
Physical Therapy Treatment  Patient Details  Name: Adam Callahan MRN: [id number] Date of Birth: May 16, 1961  Encounter Date: 06/10/2014      PT End of Session - 06/10/14 1451    Visit Number 2   Number of Visits 16   Date for PT Re-Evaluation 08/06/14   PT Start Time 1400   PT Stop Time 1443   PT Time Calculation (min) 43 min   Equipment Utilized During Treatment Gait belt   Activity Tolerance Patient tolerated treatment well;Other (comment)   Behavior During Therapy WFL for tasks assessed/performed      Past Medical History  Diagnosis Date  . Diabetes mellitus   . Hypertension     Past Surgical History  Procedure Laterality Date  . Tee without cardioversion N/A 05/03/2014    Procedure: TRANSESOPHAGEAL ECHOCARDIOGRAM (TEE);  Surgeon: Pixie Casino, MD;  Location: [facility] ENDOSCOPY;  Service: Cardiovascular;  Laterality: N/A;    There were no vitals taken for this visit.  Visit Diagnosis:  Spastic hemiplegia affecting dominant side  Right spastic hemiparesis  Abnormality of gait      Subjective Assessment - 06/10/14 1439    Symptoms Doing well, no c/o today   Limitations Standing;Walking;House hold activities   Patient Stated Goals improve RLE strength, improve mobility and balance, negotiate stairs at ex-wife's home   Currently in Pain? No/denies            [facility] Adult PT Treatment/Exercise - 06/10/14 1440    Ambulation/Gait   Ambulation/Gait Yes   Ambulation/Gait Assistance 4: Min assist   Ambulation Distance (Feet) 250 Feet   Assistive device Small based quad cane   Gait Pattern Decreased stance time - right;Decreased weight shift to right;Decreased trunk rotation;Lateral trunk lean to left;Decreased dorsiflexion - right;Trunk rotated posteriorly on right;Right steppage   Stairs Yes   Stairs Assistance 3: Mod assist;4: Min assist   Stairs Assistance Details (indicate cue type and reason) mod A needed without rail; min A needed with single rail.  Mod  cues for sequencing and safety negotiating stairs forward and sideways with single rail.  Educated pt/ex wife on safe stair negotiating   Stair Management Technique One rail Right;One rail Left;Step to pattern;Sideways;Forwards;No rails;With cane   Number of Stairs 4  x 3 reps   Height of Stairs 6          PT Education - 06/10/14 1451    Education provided Yes   Education Details stairs with hand held support   [name];Caregiver(s)   Methods Explanation   Comprehension Verbalized understanding;Need further instruction;Verbal cues required;Tactile cues required          PT Short Term Goals - 06/10/14 1509    PT SHORT TERM GOAL #1   Title independent with HEP (07/05/14)   Time 4   Period Weeks   Status On-going   PT SHORT TERM GOAL #2   Title improve BERG balance score to >/= 40/56 for improved balance (07/05/14)   Time 4   Period Weeks   Status On-going   PT SHORT TERM GOAL #3   Title improve gait velocity to >0.9 ft/sec with LRAD for improved mobility (07/05/14)   Time 4   Period Weeks   Status On-going   PT SHORT TERM GOAL #4   Title improve timed up and go to < 40 sec with LRAD for improved mobility (07/05/14)   Time 4   Period Weeks   Status On-going  PT Long Term Goals - 06/10/14 1510    PT LONG TERM GOAL #1   Title verbalize understanding of CVA risk factors/warning signs (08/02/14)   Time 8   Period Weeks   Status On-going   PT LONG TERM GOAL #2   Title improve BERG balance score to >/= 45/56 for decreased fall risk (08/02/14)   Time 8   Period Weeks   Status On-going   PT LONG TERM GOAL #3   Title negotiate stairs with LRAD with supervision for improved mobility and community access (08/02/14)   Time 8   Period Weeks   Status On-going   PT LONG TERM GOAL #4   Title improve gait velocity to > 1.31 ft/sec for improved functional mobility (08/02/14)   Time 8   Period Weeks   Status On-going   PT LONG TERM GOAL #5   Title  improve timed up and go to < 35 sec with LRAD for improved mobility (08/02/14)   Time 8   Period Weeks   Status On-going          Plan - 06/10/14 1451    Clinical Impression Statement Pt continues to need assist to negotiate stairs safely.  Pt needing mod verbal and tactile cues for sequencing and techniques responding best to "good" and "bad" instead of "right" and "left."   Pt will benefit from skilled therapeutic intervention in order to improve on the following deficits Abnormal gait;Decreased coordination;Difficulty walking;Decreased safety awareness;Decreased endurance;Decreased activity tolerance;Decreased knowledge of precautions;Impaired tone;Decreased knowledge of use of DME;Decreased balance;Decreased cognition;Decreased mobility;Decreased strength;Impaired perceived functional ability   Rehab Potential Good   Clinical Impairments Affecting Rehab Potential decreased memory and awareness of deficits   PT Frequency 2x / week   PT Duration 8 weeks   PT Treatment/Interventions ADLs/Self Care Home Management;Electrical Stimulation;Moist Heat;Cryotherapy;Balance training;DME Instruction;Manual techniques;Therapeutic exercise;Therapeutic activities;Patient/family education;Passive range of motion;Functional mobility training;Ultrasound;Stair training;Cognitive remediation;Gait training;Neuromuscular re-education   PT Next Visit Plan review CVA ed; HEP for lower extremity strengthening and gait with small based quad cane        Problem List Patient Active Problem List   Diagnosis Date Noted  . Homonymous hemianopsia following cerebrovascular accident 06/06/2014  . Acute left hemiparesis 05/04/2014  . Stroke 04/30/2014  . Spastic hemiplegia affecting dominant side 10/01/2013  . Aphasia as late effect of cerebrovascular accident 10/01/2013  . 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 Dehydration  06/07/2013  . Hypokalemia 06/07/2013  . Dyslipidemia 06/07/2013  . CVA (cerebral infarction) 06/06/2013  . Diabetes mellitus type 2, uncontrolled 06/06/2013  . Rhabdomyolysis 06/06/2013                                  Balance Exercises - 06/10/14 1444    Balance Exercises: Standing   Standing, One Foot on a Step Eyes open;4 inch;5 reps;20 secs;15 secs  15-20 sec with cues for RLE/UE weightbearing   Stepping Strategy Anterior;Posterior;UE support;Other reps (comment)  20 reps LLE only to promote RLE weight bearing   Other Standing Exercises standing lateral weight shifting at hips and trunk to promote RLE/UE weight bearing.  Mod cues needed to follow instructions.                     Laureen Abrahams, PT, DPT 06/10/2014 3:12 PM

## 2014-06-13 ENCOUNTER — Ambulatory Visit: Payer: BC Managed Care – PPO | Admitting: Physical Therapy

## 2014-06-13 ENCOUNTER — Encounter: Payer: Self-pay | Admitting: Physical Therapy

## 2014-06-13 DIAGNOSIS — R269 Unspecified abnormalities of gait and mobility: Secondary | ICD-10-CM

## 2014-06-13 DIAGNOSIS — G8111 Spastic hemiplegia affecting right dominant side: Secondary | ICD-10-CM

## 2014-06-13 DIAGNOSIS — G8110 Spastic hemiplegia affecting unspecified side: Secondary | ICD-10-CM

## 2014-06-13 DIAGNOSIS — I639 Cerebral infarction, unspecified: Secondary | ICD-10-CM

## 2014-06-13 DIAGNOSIS — I69320 Aphasia following cerebral infarction: Secondary | ICD-10-CM | POA: Diagnosis not present

## 2014-06-13 NOTE — Therapy (Signed)
Physical Therapy Treatment  Patient Details  Name: Adam Callahan MRN: [id number] Date of Birth: 09/10/1960  Encounter Date: 06/13/2014      PT End of Session - 06/13/14 1400    Visit Number 3   Number of Visits 16   Date for PT Re-Evaluation 08/06/14   PT Start Time 1325   PT Stop Time 1607   PT Time Calculation (min) 34 min   Equipment Utilized During Treatment Gait belt   Activity Tolerance Patient tolerated treatment well;Other (comment)   Behavior During Therapy WFL for tasks assessed/performed      Past Medical History  Diagnosis Date  . Diabetes mellitus   . Hypertension     Past Surgical History  Procedure Laterality Date  . Tee without cardioversion N/A 05/03/2014    Procedure: TRANSESOPHAGEAL ECHOCARDIOGRAM (TEE);  Surgeon: Pixie Casino, MD;  Location: [facility] ENDOSCOPY;  Service: Cardiovascular;  Laterality: N/A;    There were no vitals taken for this visit.  Visit Diagnosis:  Spastic hemiplegia affecting dominant side  Right spastic hemiparesis  Abnormality of gait  CVA (cerebral vascular accident)      Subjective Assessment - 06/13/14 1328    Symptoms Doing well today; pt arrived 10 min late to appt   Limitations Standing;Walking;House hold activities   Patient Stated Goals improve RLE strength, improve mobility and balance, negotiate stairs at ex-wife's home   Currently in Pain? No/denies            [facility] Adult PT Treatment/Exercise - 06/13/14 1359    Ambulation/Gait   Ambulation/Gait Yes   Ambulation/Gait Assistance 4: Min guard   Ambulation/Gait Assistance Details with R blue rocker and without AFO; pt reported increased stability with AFO over no AFO.  Recommended continued use at home and in therapy   Ambulation Distance (Feet) 230 Feet  115 x 2   Assistive device Small based quad cane   Gait Pattern Decreased stance time - right;Decreased weight shift to right;Decreased trunk rotation;Lateral trunk lean to left;Decreased  dorsiflexion - right;Trunk rotated posteriorly on right;Right steppage          PT Education - 06/13/14 1400    Education provided Yes   Education Details CVA ed   [employer]) Educated Caregiver(s);Patient   Methods Explanation;Handout   Comprehension Verbalized understanding;Need further instruction          PT Short Term Goals - 06/13/14 1552    PT SHORT TERM GOAL #1   Title independent with HEP (07/05/14)   Time 4   Period Weeks   Status On-going   PT SHORT TERM GOAL #2   Title improve BERG balance score to >/= 40/56 for improved balance (07/05/14)   Time 4   Period Weeks   Status On-going   PT SHORT TERM GOAL #3   Title improve gait velocity to >0.9 ft/sec with LRAD for improved mobility (07/05/14)   Time 4   Period Weeks   Status On-going   PT SHORT TERM GOAL #4   Title improve timed up and go to < 40 sec with LRAD for improved mobility (07/05/14)   Time 4   Period Weeks   Status On-going          PT Long Term Goals - 06/13/14 1552    PT LONG TERM GOAL #1   Title verbalize understanding of CVA risk factors/warning signs (08/02/14)   Time 8   Period Weeks   Status On-going   PT LONG TERM GOAL #2   Title  improve BERG balance score to >/= 45/56 for decreased fall risk (08/02/14)   Period Weeks   Status On-going   PT LONG TERM GOAL #3   Title negotiate stairs with LRAD with supervision for improved mobility and community access (08/02/14)   Time 8   Period Weeks   Status On-going   PT LONG TERM GOAL #4   Title improve gait velocity to > 1.31 ft/sec for improved functional mobility (08/02/14)   Time 8   Period Weeks   Status On-going   PT LONG TERM GOAL #5   Title improve timed up and go to < 35 sec with LRAD for improved mobility (08/02/14)   Time 8   Period Weeks   Status On-going          Plan - 06/13/14 1550    Clinical Impression Statement Pt demonstrated improved stability with his R blue rocker AFO.  No significant need for knee combo anymore  and therefore recommend continued gait with AFO only.  Will continue to benefit from PT to maximize functional mobility.   Pt will benefit from skilled therapeutic intervention in order to improve on the following deficits Abnormal gait;Decreased coordination;Difficulty walking;Decreased safety awareness;Decreased endurance;Decreased activity tolerance;Decreased knowledge of precautions;Impaired tone;Decreased knowledge of use of DME;Decreased balance;Decreased cognition;Decreased mobility;Decreased strength;Impaired perceived functional ability   Rehab Potential Good   Clinical Impairments Affecting Rehab Potential decreased memory and awareness of deficits   PT Frequency 2x / week   PT Duration 8 weeks   PT Treatment/Interventions ADLs/Self Care Home Management;Electrical Stimulation;Moist Heat;Cryotherapy;Balance training;DME Instruction;Manual techniques;Therapeutic exercise;Therapeutic activities;Patient/family education;Passive range of motion;Functional mobility training;Ultrasound;Stair training;Cognitive remediation;Gait training;Neuromuscular re-education   PT Next Visit Plan HEP for strengthening and gait with small based quad cane; practice stairs   Consulted and Agree with Plan of Care Patient;Family member/caregiver   Family Member Consulted ex wife        Problem List Patient Active Problem List   Diagnosis Date Noted  . Homonymous hemianopsia following cerebrovascular accident 06/06/2014  . Acute left hemiparesis 05/04/2014  . Stroke 04/30/2014  . Spastic hemiplegia affecting dominant side 10/01/2013  . Aphasia as late effect of cerebrovascular accident 10/01/2013  . 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 Dehydration 06/07/2013  . Hypokalemia 06/07/2013  . Dyslipidemia 06/07/2013  . CVA (cerebral infarction) 06/06/2013  . Diabetes mellitus type 2, uncontrolled 06/06/2013  .  Rhabdomyolysis 06/06/2013                                         Laureen Abrahams, PT, DPT 06/13/2014 3:54 PM [place]. [phone] (office) [phone] (fax)

## 2014-06-14 ENCOUNTER — Telehealth: Payer: Self-pay | Admitting: *Deleted

## 2014-06-14 ENCOUNTER — Ambulatory Visit: Payer: BC Managed Care – PPO | Admitting: Physical Therapy

## 2014-06-14 ENCOUNTER — Encounter: Payer: Self-pay | Admitting: Physical Therapy

## 2014-06-14 VITALS — BP 172/105 | HR 96

## 2014-06-14 DIAGNOSIS — R269 Unspecified abnormalities of gait and mobility: Secondary | ICD-10-CM

## 2014-06-14 DIAGNOSIS — I69320 Aphasia following cerebral infarction: Secondary | ICD-10-CM | POA: Diagnosis not present

## 2014-06-14 NOTE — Therapy (Signed)
Physical Therapy Treatment  Patient Details  Name: Adam Callahan MRN: [id number] Date of Birth: [birth year]  Encounter Date: 06/14/2014      PT End of Session - 06/14/14 1102    Visit Number 4   Number of Visits 16   Date for PT Re-Evaluation 08/06/14   PT Start Time 1024   PT Stop Time 1055   PT Time Calculation (min) 31 min   Activity Tolerance --  Did not provide activity-based therapy due to blood pressure; provided education to pt/family      Past Medical History  Diagnosis Date  . Diabetes mellitus   . Hypertension     Past Surgical History  Procedure Laterality Date  . Tee without cardioversion N/A 05/03/2014    Procedure: TRANSESOPHAGEAL ECHOCARDIOGRAM (TEE);  Surgeon: Pixie Casino, MD;  Location: [facility] ENDOSCOPY;  Service: Cardiovascular;  Laterality: N/A;    BP 172/105 mmHg  Pulse 96  Visit Diagnosis:  Abnormality of gait      Subjective Assessment - 06/14/14 1027    Symptoms Waiting to get approval for new quad cane   Currently in Pain? No/denies            [facility] Adult PT Treatment/Exercise - 06/14/14 1027    Ambulation/Gait   Ambulation/Gait --          PT Education - 06/14/14 1058    Education provided Yes   Education Details Reviewed CVA education; discussed pt's BP today (172/105); discussed HTN symptoms, CVA symptoms, ways to check blood pressure and need to follow up with Dr. Letta Pate re:  blood pressure   [name];Caregiver(s)   Methods Explanation   Comprehension Verbalized understanding    Initially discussed possibility of looking into [facility] care management      PT Short Term Goals - 06/13/14 1552    PT SHORT TERM GOAL #1   Title independent with HEP (07/05/14)   Time 4   Period Weeks   Status On-going   PT SHORT TERM GOAL #2   Title improve BERG balance score to >/= 40/56 for improved balance (07/05/14)   Time 4   Period Weeks   Status On-going   PT SHORT TERM GOAL #3   Title improve gait  velocity to >0.9 ft/sec with LRAD for improved mobility (07/05/14)   Time 4   Period Weeks   Status On-going   PT SHORT TERM GOAL #4   Title improve timed up and go to < 40 sec with LRAD for improved mobility (07/05/14)   Time 4   Period Weeks   Status On-going          PT Long Term Goals - 06/13/14 1552    PT LONG TERM GOAL #1   Title verbalize understanding of CVA risk factors/warning signs (08/02/14)   Time 8   Period Weeks   Status On-going   PT LONG TERM GOAL #2   Title improve BERG balance score to >/= 45/56 for decreased fall risk (08/02/14)   Period Weeks   Status On-going   PT LONG TERM GOAL #3   Title negotiate stairs with LRAD with supervision for improved mobility and community access (08/02/14)   Time 8   Period Weeks   Status On-going   PT LONG TERM GOAL #4   Title improve gait velocity to > 1.31 ft/sec for improved functional mobility (08/02/14)   Time 8   Period Weeks   Status On-going   PT LONG TERM GOAL #5  Title improve timed up and go to < 35 sec with LRAD for improved mobility (08/02/14)   Time 8   Period Weeks   Status On-going          Plan - 06/14/14 1103    Clinical Impression Statement Due to pt's blood pressure, pt/caregiver received education on HTN and CVA symptoms.  Recommended pt follow up with physician (pt reported no primary care physician-only Dr. Letta Pate), who was notified of pt's blood pressure by PT.   Pt will benefit from skilled therapeutic intervention in order to improve on the following deficits Abnormal gait;Decreased coordination;Difficulty walking;Decreased safety awareness;Decreased endurance;Decreased activity tolerance;Decreased knowledge of precautions;Impaired tone;Decreased knowledge of use of DME;Decreased balance;Decreased cognition;Decreased mobility;Decreased strength;Impaired perceived functional ability   Rehab Potential Good   Clinical Impairments Affecting Rehab Potential decreased memory and awareness of  deficits   PT Frequency 2x / week   PT Duration 8 weeks   PT Treatment/Interventions ADLs/Self Care Home Management;Electrical Stimulation;Moist Heat;Cryotherapy;Balance training;DME Instruction;Manual techniques;Therapeutic exercise;Therapeutic activities;Patient/family education;Passive range of motion;Functional mobility training;Ultrasound;Stair training;Cognitive remediation;Gait training;Neuromuscular re-education   PT Next Visit Plan Check BP. HEP for strength; gait with small based quad cane; practice stairs   Consulted and Agree with Plan of Care Patient;Family member/caregiver   Family Member Consulted ex wife        Problem List Patient Active Problem List   Diagnosis Date Noted  . Homonymous hemianopsia following cerebrovascular accident 06/06/2014  . Acute left hemiparesis 05/04/2014  . Stroke 04/30/2014  . Spastic hemiplegia affecting dominant side 10/01/2013  . Aphasia as late effect of cerebrovascular accident 10/01/2013  . 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 Dehydration 06/07/2013  . Hypokalemia 06/07/2013  . Dyslipidemia 06/07/2013  . CVA (cerebral infarction) 06/06/2013  . Diabetes mellitus type 2, uncontrolled 06/06/2013  . Rhabdomyolysis 06/06/2013                                              Zayveon Raschke W. 06/14/2014, 11:07 AM     Mady Haagensen, PT 06/14/2014 11:09 AM Phone: [phone] Fax: [phone]

## 2014-06-14 NOTE — Telephone Encounter (Signed)
Pt came in for PT, during stroke screening his B/P was recorded as 172/105. PT was cancelled for today. Therapist would like patient to have some kind of f/u with a physician. Pt does not have a PCP. Therapist would like recommendation on how to proceed

## 2014-06-15 NOTE — Telephone Encounter (Signed)
Needs to go to urgent care at [facility], they can help set up [facility] clinic f/u

## 2014-06-15 NOTE — Telephone Encounter (Signed)
Called back, relayed Dr. Letta Pate message, they will encourage pt to do so

## 2014-06-20 ENCOUNTER — Ambulatory Visit: Payer: BC Managed Care – PPO | Admitting: Physical Therapy

## 2014-06-22 ENCOUNTER — Ambulatory Visit: Payer: BC Managed Care – PPO

## 2014-06-22 ENCOUNTER — Ambulatory Visit: Payer: BC Managed Care – PPO | Admitting: Physical Therapy

## 2014-06-22 ENCOUNTER — Ambulatory Visit: Payer: BC Managed Care – PPO | Attending: Physical Medicine & Rehabilitation | Admitting: Occupational Therapy

## 2014-06-22 ENCOUNTER — Encounter: Payer: Self-pay | Admitting: Physical Therapy

## 2014-06-22 VITALS — BP 146/96

## 2014-06-22 DIAGNOSIS — R41841 Cognitive communication deficit: Secondary | ICD-10-CM | POA: Diagnosis not present

## 2014-06-22 DIAGNOSIS — I69320 Aphasia following cerebral infarction: Secondary | ICD-10-CM | POA: Insufficient documentation

## 2014-06-22 DIAGNOSIS — R269 Unspecified abnormalities of gait and mobility: Secondary | ICD-10-CM | POA: Insufficient documentation

## 2014-06-22 DIAGNOSIS — G8110 Spastic hemiplegia affecting unspecified side: Secondary | ICD-10-CM | POA: Diagnosis not present

## 2014-06-22 DIAGNOSIS — R4701 Aphasia: Secondary | ICD-10-CM

## 2014-06-22 DIAGNOSIS — R471 Dysarthria and anarthria: Secondary | ICD-10-CM | POA: Diagnosis not present

## 2014-06-22 NOTE — Therapy (Signed)
[facility] [address] [place], [place], [address] Phone: [phone]   Fax:  [phone]  Physical Therapy Treatment  Patient Details  Name: KATELYN KOHLMEYER MRN: [id number] Date of Birth: 11-Feb-1961  Encounter Date: 06/22/2014      PT End of Session - 06/22/14 1242    Visit Number 5   Number of Visits 16   Date for PT Re-Evaluation 08/06/14   PT Start Time 7209   PT Stop Time 1233   PT Time Calculation (min) 38 min   Activity Tolerance Patient tolerated treatment well      Past Medical History  Diagnosis Date  . Diabetes mellitus   . Hypertension     Past Surgical History  Procedure Laterality Date  . Tee without cardioversion N/A 05/03/2014    Procedure: TRANSESOPHAGEAL ECHOCARDIOGRAM (TEE);  Surgeon: Pixie Casino, MD;  Location: [facility] ENDOSCOPY;  Service: Cardiovascular;  Laterality: N/A;    BP 146/96 mmHg  Visit Diagnosis:  Abnormality of gait      Subjective Assessment - 06/22/14 1154    Symptoms Went to the doctor and my blood pressure was better.   Currently in Pain? No/denies            [facility] Adult PT Treatment/Exercise - 06/22/14 1156    Bed Mobility   Bed Mobility Sitting - Scoot to Edge of Bed;Sit to Supine;Supine to Sit   Supine to Sit 4: Min assist   Supine to Sit Details (indicate cue type and reason) assistance at UE given to complete supine<>sit   Sitting - Scoot to Edge of Bed 4: Min guard  cues for lateral unweighting to scoot   Sit to Supine 5: Supervision   Transfers   Transfers Sit to Stand;Stand to Sit   Transfers Yes   Sit to Stand 5: Supervision;Without upper extremity assist  from 22 inch surface 15 reps, 5 reps from elevated mat   Sit to Stand Details (indicate cue type and reason) cues for foot placement, equal weightbearing;   Stand to Sit 5: Supervision   Stand Pivot Transfers 4: Min guard;From elevated surface  wheelchair to mat   Exercises   Exercises Knee/Hip   Knee/Hip Exercises: Supine   Quad Sets Right;10 reps   Short Arc Quad Sets Right;10 reps  therapist's assistance given to control descent   Short Arc Quad Sets Limitations uncontrolled descent onto mat surface, extensor tone kicks in with cues for hamstring activation on R   Bridges 10 reps  unilateral bridge RLE 10 reps   Other Supine Knee Exercises hooklying marching 10 reps          PT Education - 06/22/14 1240    Education provided Yes   Education Details Provided HEP-bridging, sit<>stand with attention to foot placement and weightbearing, quad sets; provided information from follow-up call from Dr. Dianna Limbo office with recommendation for pt to follow up at [facility] for primary care provider   [name];Other (comment)  ex-wife   Methods Explanation;Demonstration;Handout   Comprehension Verbalized understanding;Returned demonstration;Need further instruction          PT Short Term Goals - 06/22/14 1246    PT SHORT TERM GOAL #1   Title independent with HEP (07/05/14)   Status On-going   PT SHORT TERM GOAL #2   Title improve BERG balance score to >/= 40/56 for improved balance (07/05/14)   Status On-going   PT SHORT TERM GOAL #3   Title improve gait velocity to >0.9  ft/sec with LRAD for improved mobility (07/05/14)   Status On-going   PT SHORT TERM GOAL #4   Title improve timed up and go to < 40 sec with LRAD for improved mobility (07/05/14)   Status On-going          PT Long Term Goals - 06/22/14 1247    PT LONG TERM GOAL #1   Title verbalize understanding of CVA risk factors/warning signs (08/02/14)   Status On-going   PT LONG TERM GOAL #2   Title improve BERG balance score to >/= 45/56 for decreased fall risk (08/02/14)   Status On-going   PT LONG TERM GOAL #3   Title negotiate stairs with LRAD with supervision for improved mobility and community access (08/02/14)   Status On-going   PT LONG TERM GOAL #4   Title improve gait  velocity to > 1.31 ft/sec for improved functional mobility (08/02/14)   Status On-going   PT LONG TERM GOAL #5   Title improve timed up and go to < 35 sec with LRAD for improved mobility (08/02/14)   Status On-going          Plan - 06/22/14 1243    Clinical Impression Statement Initiated HEP this visit to address RLE strength, weightbearing for improved RLE functional strength.  Pt requires verbal and tactile cues, as he has difficulty placing RLE independently.    Pt would continue to benefit from further PT to address additional RLE strengthening and neuro reeducation.   Pt will benefit from skilled therapeutic intervention in order to improve on the following deficits Abnormal gait;Decreased coordination;Difficulty walking;Decreased safety awareness;Decreased endurance;Decreased activity tolerance;Decreased knowledge of precautions;Impaired tone;Decreased knowledge of use of DME;Decreased balance;Decreased cognition;Decreased mobility;Decreased strength;Impaired perceived functional ability   Rehab Potential Good   Clinical Impairments Affecting Rehab Potential decreased memory and awareness of deficits   PT Frequency 2x / week   PT Duration 8 weeks   PT Treatment/Interventions ADLs/Self Care Home Management;Electrical Stimulation;Moist Heat;Cryotherapy;Balance training;DME Instruction;Manual techniques;Therapeutic exercise;Therapeutic activities;Patient/family education;Passive range of motion;Functional mobility training;Ultrasound;Stair training;Cognitive remediation;Gait training;Neuromuscular re-education   PT Next Visit Plan Review HEP, gait with small based quad cane; practice stairs   Consulted and Agree with Plan of Care Patient;Family member/caregiver                               Problem List Patient Active Problem List   Diagnosis Date Noted  . Homonymous hemianopsia following cerebrovascular accident 06/06/2014  . Acute left hemiparesis 05/04/2014  .  Stroke 04/30/2014  . Spastic hemiplegia affecting dominant side 10/01/2013  . Aphasia as late effect of cerebrovascular accident 10/01/2013  . 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 Dehydration 06/07/2013  . Hypokalemia 06/07/2013  . Dyslipidemia 06/07/2013  . CVA (cerebral infarction) 06/06/2013  . Diabetes mellitus type 2, uncontrolled 06/06/2013  . Rhabdomyolysis 06/06/2013    Toba Claudio W. 06/22/2014, 12:48 PM     Eleena Grater, PT 06/22/2014 12:51 PM Phone: [phone] Fax: [phone]

## 2014-06-22 NOTE — Patient Instructions (Signed)
Remember to describe if you are having trouble with finding the word, or think of another word to say instead of the word you want to say.

## 2014-06-22 NOTE — Patient Instructions (Signed)
HIP / KNEE: Extension - Sit to Stand   Sitting on your bed, scoot out to the edge.  Make sure your feet are shoulder-width apart and beside each other.  Lean chest forward, raise hips up from surface. Straighten hips and knees. Weight bear equally on left and right sides. Backs of legs should not push off surface. 10___ reps per set, __2_ sets per day.  Have a chair in front for support if needed.  Copyright  VHI. All rights reserved.    Bridging   Slowly raise buttocks from bed, keeping stomach tight.  *Make sure to keeping breathing.  Repeat __10_ times per set. Do __2__ sets per session. Do __1-2_ sessions per day.  http://orth.exer.us/1096   Copyright  VHI. All rights reserved.    [employer] down on your bed.  Slowly tighten thigh muscles of your right leg while counting out loud to __3_. You should be pushing your right knee down into the bed.Relax. Repeat _10__ times. Do __2__ sessions per day.  http://gt2.exer.us/292   Copyright  VHI. All rights reserved.

## 2014-06-22 NOTE — Therapy (Signed)
[facility] [address] [place], [place], [address] Phone: [phone]   Fax:  [phone]  Speech Language Pathology Treatment  Patient Details  Name: Adam Callahan MRN: [id number] Date of Birth: January 23, 1961  Encounter Date: 06/22/2014      End of Session - 06/22/14 1139    Visit Number 3   Number of Visits 16   Date for SLP Re-Evaluation 07/25/14   SLP Start Time 1108   SLP Time Calculation (min) 1145   SLP Time Calculation (min) 37 min      Past Medical History  Diagnosis Date  . Diabetes mellitus   . Hypertension     Past Surgical History  Procedure Laterality Date  . Tee without cardioversion N/A 05/03/2014    Procedure: TRANSESOPHAGEAL ECHOCARDIOGRAM (TEE);  Surgeon: Pixie Casino, MD;  Location: [facility] ENDOSCOPY;  Service: Cardiovascular;  Laterality: N/A;    There were no vitals taken for this visit.  Visit Diagnosis: Cognitive communication deficit, Aphasia      Subjective Assessment - 06/22/14 1124    Symptoms (ex-wife) "He's doing real good with it now' re: medication times with alarms.    Currently in Pain? No/denies   Pain Score 0-No pain            ADULT SLP TREATMENT - 06/22/14 1505    General Information   Behavior/Cognition Cooperative;Pleasant mood;Confused;Distractible   Treatment Provided   Treatment provided Cognitive-Linquistic   Cognitive-Linquistic Treatment   Treatment focused on Aphasia   Skilled Treatment Describing pictures, pt req'd occasinal min-mod assist for success (household items). Pt req'd cues occasionally to DESCRIBE and not NAME the pictures. His success with anomia with multiple meaning words was 60%. Usual min A needed with this task. His intelligibility in conversation remained 95-100%. LTG met. [name] (ex-wife) and pt agree that alarms help pt to recall med administration. LTG met.   [place] [employer] Scales of Cognitive Functioning Confused/appropriate   Assessment / Recommendations / Plan   Plan Continue with current plan of care   Progression Toward Goals   Progression toward goals Progressing toward goals          SLP Education - 06/22/14 1138    Education provided Yes   Education Details Talked with pt/ex-wife re: compensations for pt's anomia.   [name];Spouse   Methods Explanation;Demonstration;Verbal cues   Comprehension Verbalized understanding;Verbal cues required;Need further instruction          SLP Short Term Goals - 06/22/14 [place] #1   Title Pt will use compensations in 8/10 sentence responses with occasional min A   Time 2   Period Weeks   Status On-going   SLP SHORT TERM GOAL #2   Title Pt will utilize memory compensation system (memory log) for recall of past events with occasional min A   Time 2   Period Weeks   Status On-going   SLP SHORT TERM GOAL #3   Status On-going   SLP SHORT TERM GOAL #4   Title Pt will use attempt compensations for anomia in conversation with rare min A to do so   Time 2   Period Weeks   Status On-going   SLP SHORT TERM GOAL #5   Title demo understanding of CVA education   Time 2   Period Weeks   Status On-going          SLP Long Term Goals - 06/22/14 1143    SLP LONG  TERM GOAL #1   Title Pt will utilize memory compensation system (memory log) for recall of past events with rare min A   Time 6   Period Weeks   Status On-going   SLP LONG TERM GOAL #2   Title Pt will use attempt compensations for anomia in conversation with rare min A to do so   Time 6   Period Weeks   Status On-going   SLP LONG TERM GOAL #3   Title Pt will exhibit >90% intelligibility in min-mod complex conversation   Period Weeks   Status Achieved   SLP LONG TERM GOAL #4   Title Pt will demo knowledge when caregiver to give him meds with modified independence (external reminder)   Status Achieved   SLP LONG TERM GOAL #5   Title LTG number one: Pt will  utilize memory compensation system (memory log) for recall of past events with rare min A   Time 6   Status Deferred    LTG#5 was a doc error and was thus deferred.      Plan - 06/22/14 1140    Clinical Impression Statement Pt presents with cont impaired language (anomia) and cognition skills. He is not utilizing compensations for anomia adequately and requires cues to do so. Baseline cognitive deficits hinder pt's performance somewhat.   Speech Therapy Frequency 2x / week   Duration --  6 weeks remain after this week   Treatment/Interventions Compensatory strategies;Internal/external aids;Environmental Environmental consultant;Other (comment);SLP instruction and feedback;Functional tasks   Potential to Achieve Goals Good   Potential Considerations Ability to learn/carryover information;Severity of impairments;Previous level of function   Consulted and Agree with Plan of Care Patient              Problem List Patient Active Problem List   Diagnosis Date Noted  . Homonymous hemianopsia following cerebrovascular accident 06/06/2014  . Acute left hemiparesis 05/04/2014  . Stroke 04/30/2014  . Spastic hemiplegia affecting dominant side 10/01/2013  . Aphasia as late effect of cerebrovascular accident 10/01/2013  . 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 Dehydration 06/07/2013  . Hypokalemia 06/07/2013  . Dyslipidemia 06/07/2013  . CVA (cerebral infarction) 06/06/2013  . Diabetes mellitus type 2, uncontrolled 06/06/2013  . Rhabdomyolysis 06/06/2013    Garald Balding, [place], [place]  06/22/2014 3:16 PM  Phone: [phone] Fax: [phone]

## 2014-06-24 ENCOUNTER — Ambulatory Visit: Payer: BC Managed Care – PPO

## 2014-06-24 ENCOUNTER — Encounter: Payer: Self-pay | Admitting: Physical Therapy

## 2014-06-24 ENCOUNTER — Ambulatory Visit: Payer: BC Managed Care – PPO | Admitting: Physical Therapy

## 2014-06-24 ENCOUNTER — Ambulatory Visit: Payer: BC Managed Care – PPO | Admitting: *Deleted

## 2014-06-24 DIAGNOSIS — M6281 Muscle weakness (generalized): Secondary | ICD-10-CM

## 2014-06-24 DIAGNOSIS — R269 Unspecified abnormalities of gait and mobility: Secondary | ICD-10-CM

## 2014-06-24 DIAGNOSIS — R4701 Aphasia: Secondary | ICD-10-CM

## 2014-06-24 DIAGNOSIS — I69320 Aphasia following cerebral infarction: Secondary | ICD-10-CM | POA: Diagnosis not present

## 2014-06-24 NOTE — Therapy (Signed)
[facility] [address] [place], [place], [address] Phone: [phone]   Fax:  [phone]  Speech Language Pathology Treatment  Patient Details  Name: Adam Callahan MRN: [id number] Date of Birth: 1960/10/02  Encounter Date: 06/24/2014      End of Session - 06/24/14 1041    Visit Number 4   Number of Visits 16   Date for SLP Re-Evaluation 07/25/14   SLP Start Time 0931   SLP Stop Time  1950   SLP Time Calculation (min) 43 min   Activity Tolerance Patient tolerated treatment well      Past Medical History  Diagnosis Date  . Diabetes mellitus   . Hypertension     Past Surgical History  Procedure Laterality Date  . Tee without cardioversion N/A 05/03/2014    Procedure: TRANSESOPHAGEAL ECHOCARDIOGRAM (TEE);  Surgeon: Pixie Casino, MD;  Location: [facility] ENDOSCOPY;  Service: Cardiovascular;  Laterality: N/A;    There were no vitals taken for this visit.  Visit Diagnosis: No diagnosis found.      Subjective Assessment - 06/24/14 1036    Symptoms "It's (the word is) on the tip of my head!"   Currently in Pain? No/denies            ADULT SLP TREATMENT - 06/24/14 1036    General Information   Behavior/Cognition Alert;Cooperative;Pleasant mood;Confused;Requires cueing;Distractible   Treatment Provided   Treatment provided Cognitive-Linquistic   Pain Assessment   Pain Assessment No/denies pain   Cognitive-Linquistic Treatment   Treatment focused on Aphasia   Skilled Treatment "3 clues" activity required max A from SLP usually 75%. Without cues, 25% sucess overall. In object description task, pt was req'd to be given mod cues by SLP usually.    Assessment / Recommendations / Plan   Plan Continue with current plan of care   Progression Toward Goals   Progression toward goals Progressing toward goals          SLP Education - 06/24/14 1041    Education provided Yes   Education Details Compensations for  anomia.   [name];Caregiver(s)   Methods Explanation;Demonstration   Comprehension Verbalized understanding;Need further instruction          SLP Short Term Goals - 06/24/14 1043    SLP SHORT TERM GOAL #1   Title Pt will use compensations in 8/10 sentence responses with occasional min A   Time 2   Period Weeks   Status On-going   SLP SHORT TERM GOAL #2   Title Pt will utilize memory compensation system (memory log) for recall of past events with occasional min A   Time 2   Period Weeks   Status On-going   SLP SHORT TERM GOAL #3   Title Pt will complete simple naming tasks with occasional min A   Time 2   Period Weeks   Status On-going   SLP SHORT TERM GOAL #4   Title Pt will use attempt compensations for anomia in conversation with rare min A to do so   Time 2   Period Weeks   Status On-going   SLP SHORT TERM GOAL #5   Title demo understanding of CVA education   Time 2   Period Weeks   Status On-going          SLP Long Term Goals - 06/24/14 1043    SLP LONG TERM GOAL #1   Title Pt will utilize memory compensation system (memory log) for recall of past events  with rare min A   Time 6   Period Weeks   Status On-going   SLP LONG TERM GOAL #2   Title Pt will use attempt compensations for anomia in conversation with rare min A to do so   Time 6   Period Weeks   Status On-going   SLP LONG TERM GOAL #3   Title Pt will exhibit >90% intelligibility in min-mod complex conversation   Period Weeks   Status Achieved   SLP LONG TERM GOAL #4   Title Pt will demo knowledge when caregiver to give him meds with modified independence (external reminder)   Status Achieved   SLP LONG TERM GOAL #5   Title LTG number one: Pt will utilize memory compensation system (memory log) for recall of past events with rare min A   Time 6   Status Deferred          Plan - 06/24/14 1042    Clinical Impression Statement Pt presents with cont impaired language  (anomia) and cognition skills. He is not utilizing compensations for anomia adequately and requires cues to do so. Baseline cognitive deficits hinder pt's performance somewhat.   Speech Therapy Frequency 2x / week   Duration --  6 weeks   Treatment/Interventions Compensatory strategies;Internal/external aids;Environmental Environmental consultant;Other (comment);SLP instruction and feedback;Functional tasks   Potential to Achieve Goals Good   Potential Considerations Ability to learn/carryover information;Severity of impairments;Previous level of function   Consulted and Agree with Plan of Care Patient;Family member/caregiver          Problem List Patient Active Problem List   Diagnosis Date Noted  . Homonymous hemianopsia following cerebrovascular accident 06/06/2014  . Acute left hemiparesis 05/04/2014  . Stroke 04/30/2014  . Spastic hemiplegia affecting dominant side 10/01/2013  . Aphasia as late effect of cerebrovascular accident 10/01/2013  . 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 Dehydration 06/07/2013  . Hypokalemia 06/07/2013  . Dyslipidemia 06/07/2013  . CVA (cerebral infarction) 06/06/2013  . Diabetes mellitus type 2, uncontrolled 06/06/2013  . Rhabdomyolysis 06/06/2013    Garald Balding, [place], [place]  06/24/2014 10:44 AM  Phone: [phone] Fax: [phone]

## 2014-06-24 NOTE — Therapy (Signed)
[facility] [address] [place], [place], [address] Phone: [phone]   Fax:  [phone]  Physical Therapy Treatment  Patient Details  Name: Adam Callahan MRN: [id number] Date of Birth: [birth year]  Encounter Date: 06/24/2014      PT End of Session - 06/24/14 1143    Visit Number 6   Number of Visits 16   Date for PT Re-Evaluation 08/06/14   PT Start Time 1016   PT Stop Time 1102   PT Time Calculation (min) 46 min   Equipment Utilized During Treatment Gait belt   Activity Tolerance Patient tolerated treatment well      Past Medical History  Diagnosis Date  . Diabetes mellitus   . Hypertension     Past Surgical History  Procedure Laterality Date  . Tee without cardioversion N/A 05/03/2014    Procedure: TRANSESOPHAGEAL ECHOCARDIOGRAM (TEE);  Surgeon: Pixie Casino, MD;  Location: [facility] ENDOSCOPY;  Service: Cardiovascular;  Laterality: N/A;    There were no vitals taken for this visit.  Visit Diagnosis:  Abnormality of gait      Subjective Assessment - 06/24/14 1142    Symptoms No complaints today.   Currently in Pain? No/denies      Gait:  Issued large base quad cane to patient (per patient preference vs small base quad cane).  Adjusted to appropriate height.  Pt ambulates 150 ft. X 2 reps with [facility] with min guard assistance and minimal cues for increased weightshift to RLE.  Gait 100 ft x 3 reps with [facility] with min guard assistance.  Neuro Reeducation:  Sit<>stand x 6 reps with cues for equal weightbearing through bilateral lower extremities.  Standing neuro reeducation at counter, with lateral weighshifting x 10 reps, tactile cues at R quads for activation.  Standing with LUE reach for improved upright posture and quad activation with standing.  At 6 inch step LLE step taps with tactile and verbal cues for quad activation, terminal knee extension on RLE.  Attempted static standing with RLE weightbearing and LLE  propped on step with minimal assistance.        PT Education - 06/24/14 1142    Education provided Yes   Education Details Provided patient with quad cane; adjusted to appropriate height and pt completed paperwork for quad cane.   [name];Caregiver(s)   Methods Explanation   Comprehension Verbalized understanding          PT Short Term Goals - 06/24/14 1145    PT SHORT TERM GOAL #1   Title independent with HEP (07/05/14)   Status On-going   PT SHORT TERM GOAL #2   Title improve BERG balance score to >/= 40/56 for improved balance (07/05/14)   Status On-going   PT SHORT TERM GOAL #3   Title improve gait velocity to >0.9 ft/sec with LRAD for improved mobility (07/05/14)   Status On-going   PT SHORT TERM GOAL #4   Title improve timed up and go to < 40 sec with LRAD for improved mobility (07/05/14)   Status On-going          PT Long Term Goals - 06/24/14 1145    PT LONG TERM GOAL #1   Title verbalize understanding of CVA risk factors/warning signs (08/02/14)   Status On-going   PT LONG TERM GOAL #2   Title improve BERG balance score to >/= 45/56 for decreased fall risk (08/02/14)   Status On-going   PT LONG TERM GOAL #3   Title  negotiate stairs with LRAD with supervision for improved mobility and community access (08/02/14)   Status On-going   PT LONG TERM GOAL #4   Title improve gait velocity to > 1.31 ft/sec for improved functional mobility (08/02/14)   Status On-going   PT LONG TERM GOAL #5   Title improve timed up and go to < 35 sec with LRAD for improved mobility (08/02/14)   Status On-going          Plan - 06/24/14 1143    Clinical Impression Statement Pt receives new large base quad cane today.  Pt ambulates with min guard assistance with quad cane, with cues for increased RLE weightbearing and stance time.   Pt will benefit from skilled therapeutic intervention in order to improve on the following deficits Abnormal gait;Decreased  coordination;Difficulty walking;Decreased safety awareness;Decreased endurance;Decreased activity tolerance;Decreased knowledge of precautions;Impaired tone;Decreased knowledge of use of DME;Decreased balance;Decreased cognition;Decreased mobility;Decreased strength;Impaired perceived functional ability   Rehab Potential Good   Clinical Impairments Affecting Rehab Potential decreased memory and awareness of deficits   PT Frequency 2x / week   PT Duration 8 weeks   PT Treatment/Interventions ADLs/Self Care Home Management;Electrical Stimulation;Moist Heat;Cryotherapy;Balance training;DME Instruction;Manual techniques;Therapeutic exercise;Therapeutic activities;Patient/family education;Passive range of motion;Functional mobility training;Ultrasound;Stair training;Cognitive remediation;Gait training;Neuromuscular re-education   PT Next Visit Plan Review HEP, continue gait with large based quad cane (pt preference) standing neuro reducation and strength RLE   Consulted and Agree with Plan of Care Patient;Family member/caregiver                               Problem List Patient Active Problem List   Diagnosis Date Noted  . Homonymous hemianopsia following cerebrovascular accident 06/06/2014  . Acute left hemiparesis 05/04/2014  . Stroke 04/30/2014  . Spastic hemiplegia affecting dominant side 10/01/2013  . Aphasia as late effect of cerebrovascular accident 10/01/2013  . 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 Dehydration 06/07/2013  . Hypokalemia 06/07/2013  . Dyslipidemia 06/07/2013  . CVA (cerebral infarction) 06/06/2013  . Diabetes mellitus type 2, uncontrolled 06/06/2013  . Rhabdomyolysis 06/06/2013    Shravya Wickwire W. 06/24/2014, 11:46 AM    Mady Haagensen, PT 06/24/2014 11:51 AM Phone: [phone] Fax: [phone]

## 2014-06-24 NOTE — Patient Instructions (Signed)
  Please complete the assigned speech therapy homework and return it to your next session.  

## 2014-06-27 ENCOUNTER — Encounter: Payer: Self-pay | Admitting: *Deleted

## 2014-06-27 NOTE — Therapy (Signed)
[facility] [address] [place], [place], [address] Phone: [phone]   Fax:  [phone]  Occupational Therapy Treatment  Patient Details  Name: Adam Callahan MRN: [id number] Date of Birth: 1961/06/21  Encounter Date: 06/24/2014  Note: This therapy treatment note was created during EPIC down time.   Past Medical History  Diagnosis Date  . Diabetes mellitus   . Hypertension     Past Surgical History  Procedure Laterality Date  . Tee without cardioversion N/A 05/03/2014    Procedure: TRANSESOPHAGEAL ECHOCARDIOGRAM (TEE);  Surgeon: Pixie Casino, MD;  Location: [facility] ENDOSCOPY;  Service: Cardiovascular;  Laterality: N/A;    There were no vitals taken for this visit.  Visit Diagnosis:  Generalized muscle weakness  Pt seen for Neuromuscular Re-education today RUE. Gentle PROM hand,wrist and digits for flexion & fisting (x31min). WB activities on sitting edge of mat, through right UE for WB and proximal stabilization ex's x10-15 reps each w/ Min vc's for positioning and follow through (x34min).   Gentle PROM on mat in supine for gentle elbow, wrist and forearm as well as shoulder flexion/extension, IR/ER, ABD (x53min) Pt & caregiver verbally educated and demonstrated/performance on self PROM, pt able to demo in clinic after vc's/min A. Caregiver educated as well.          OT Education - 06/27/14 0818    Education provided Yes   Education Details Self PROM RUE, home program.   [name];Caregiver(s)   Methods Explanation;Demonstration;Tactile cues;Verbal cues   Comprehension Verbalized understanding;Tactile cues required;Verbal cues required     Pt and caregiver were educated verbally and via demonstration and performance in clinic for self/PROM RUE gentle shoulder flexion, Abd, horizontal ABD in supine, elbow flexion/extension, and digital PROM/tendon glides as well as WB/stabilization ex's through UE  in sitting. Pt requires Min A for positioning and follow through noted, he verbalized understanding as did his caregiver today in clinic.         Plan - 06/27/14 0821    Clinical Impression Statement Pt is a 53 y/o RHD male s/p CVA and R sided weakness. He presents with decreased ROM RUE s/p CVA, decreased cognition, decreased mobility and impaired tone.                               Problem List Patient Active Problem List   Diagnosis Date Noted  . Homonymous hemianopsia following cerebrovascular accident 06/06/2014  . Acute left hemiparesis 05/04/2014  . Stroke 04/30/2014  . Spastic hemiplegia affecting dominant side 10/01/2013  . Aphasia as late effect of cerebrovascular accident 10/01/2013  . 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 Dehydration 06/07/2013  . Hypokalemia 06/07/2013  . Dyslipidemia 06/07/2013  . CVA (cerebral infarction) 06/06/2013  . Diabetes mellitus type 2, uncontrolled 06/06/2013  . Rhabdomyolysis 06/06/2013    Almyra Deforest, OT 06/27/2014, 8:26 AM

## 2014-06-27 NOTE — Patient Instructions (Signed)
Pt and caregiver were educated verbally and via demonstration and performance in clinic for self/PROM RUE gentle shoulder flexion, Abd, horizontal ABD in supine, elbow flexion/extension, and digital PROM/tendon glides as well as WB/stabilization ex's through UE in sitting. Pt requires Min A for positioning and follow through noted, he verbalized understanding as did his caregiver today in clinic.

## 2014-06-28 ENCOUNTER — Encounter: Payer: Self-pay | Admitting: Physical Therapy

## 2014-06-28 ENCOUNTER — Ambulatory Visit: Payer: BC Managed Care – PPO

## 2014-06-28 ENCOUNTER — Ambulatory Visit: Payer: BC Managed Care – PPO | Admitting: Physical Therapy

## 2014-06-28 ENCOUNTER — Ambulatory Visit: Payer: BC Managed Care – PPO | Admitting: Occupational Therapy

## 2014-06-28 ENCOUNTER — Encounter: Payer: Self-pay | Admitting: Occupational Therapy

## 2014-06-28 DIAGNOSIS — M6281 Muscle weakness (generalized): Secondary | ICD-10-CM

## 2014-06-28 DIAGNOSIS — I69998 Other sequelae following unspecified cerebrovascular disease: Secondary | ICD-10-CM

## 2014-06-28 DIAGNOSIS — R269 Unspecified abnormalities of gait and mobility: Secondary | ICD-10-CM

## 2014-06-28 DIAGNOSIS — I69320 Aphasia following cerebral infarction: Secondary | ICD-10-CM | POA: Diagnosis not present

## 2014-06-28 DIAGNOSIS — R4701 Aphasia: Secondary | ICD-10-CM

## 2014-06-28 DIAGNOSIS — G8111 Spastic hemiplegia affecting right dominant side: Secondary | ICD-10-CM

## 2014-06-28 DIAGNOSIS — G8110 Spastic hemiplegia affecting unspecified side: Secondary | ICD-10-CM

## 2014-06-28 DIAGNOSIS — R209 Unspecified disturbances of skin sensation: Secondary | ICD-10-CM

## 2014-06-28 NOTE — Patient Instructions (Signed)
  Please complete the assigned speech therapy homework and return it to your next session.  

## 2014-06-28 NOTE — Therapy (Signed)
[facility] [address] [place], [place], [address] Phone: [phone]   Fax:  [phone]  Physical Therapy Treatment  Patient Details  Name: Adam Callahan MRN: [id number] Date of Birth: [birth year]  Encounter Date: 06/28/2014      PT End of Session - 06/28/14 1647    Visit Number 7   Number of Visits 16   Date for PT Re-Evaluation 08/06/14   PT Start Time 3300   PT Stop Time 1450   PT Time Calculation (min) 45 min   Equipment Utilized During Treatment Gait belt   Activity Tolerance Patient tolerated treatment well   Behavior During Therapy [facility] for tasks assessed/performed      Past Medical History  Diagnosis Date  . Diabetes mellitus   . Hypertension     Past Surgical History  Procedure Laterality Date  . Tee without cardioversion N/A 05/03/2014    Procedure: TRANSESOPHAGEAL ECHOCARDIOGRAM (TEE);  Surgeon: Pixie Casino, MD;  Location: [facility] ENDOSCOPY;  Service: Cardiovascular;  Laterality: N/A;    There were no vitals taken for this visit.  Visit Diagnosis:  Generalized muscle weakness  Abnormality of gait      Subjective Assessment - 06/28/14 1455    Symptoms No c/o.  Initially nonverbal but began talking as treatment progressed.   Currently in Pain? No/denies            [facility] Adult PT Treatment/Exercise - 06/28/14 1641    Bed Mobility   Bed Mobility Sitting - Scoot to Edge of Bed;Sit to Supine;Supine to Sit   Supine to Sit 4: Min assist   Supine to Sit Details (indicate cue type and reason) assist for trunk pushing up from RUE   Sitting - Scoot to Edge of Bed 4: Min guard   Sit to Supine 5: Supervision   Transfers   Transfers Sit to Stand;Stand to Sit   Transfers Yes   Sit to Stand 5: Supervision;Without upper extremity assist;From chair/3-in-1;Other/comment  repeated x 10 times as part of HEP   Stand to Sit 5: Supervision   Stand Pivot Transfers 4: Min guard   Stand Pivot Transfer Details  (indicate cue type and reason) used [facility] for transfer   Knee/Hip Exercises: Standing   Other Standing Knee Exercises sit-stand x 10 with LLE on green bubble, green disc, 4"step and 6"step to work on RLE weight bearing with transfer.  Used Geologist, engineering for Warden/ranger.   Knee/Hip Exercises: Supine   Quad Sets Right;1 set;10 reps   Short Arc Quad Sets Right;1 set;10 reps  2# weight   Bridges 2 sets;10 reps   Other Supine Knee Exercises hooklying hip abduction x 10          PT Education - 06/28/14 1647    Education provided Yes   Education Details HEP, equal LE WB with sit-stand   [name];Spouse   Methods Explanation;Demonstration   Comprehension Verbalized understanding;Need further instruction           Plan - 06/28/14 1647    Clinical Impression Statement Pt tolerated treatment well and responded well to verbal and visual feedback.     Pt will benefit from skilled therapeutic intervention in order to improve on the following deficits Abnormal gait;Decreased coordination;Difficulty walking;Decreased safety awareness;Decreased endurance;Decreased activity tolerance;Decreased knowledge of precautions;Impaired tone;Decreased knowledge of use of DME;Decreased balance;Decreased cognition;Decreased mobility;Decreased strength;Impaired perceived functional ability   Rehab Potential Good   Clinical Impairments Affecting Rehab Potential decreased memory and awareness of deficits  PT Frequency 2x / week   PT Duration 8 weeks   PT Treatment/Interventions ADLs/Self Care Home Management;Electrical Stimulation;Moist Heat;Cryotherapy;Balance training;DME Instruction;Manual techniques;Therapeutic exercise;Therapeutic activities;Patient/family education;Passive range of motion;Functional mobility training;Ultrasound;Stair training;Cognitive remediation;Gait training;Neuromuscular re-education   PT Next Visit Plan Continue gait with [facility], standing neuro reeducation.   Consulted and  Agree with Plan of Care Patient;Family member/caregiver      Problem List Patient Active Problem List   Diagnosis Date Noted  . Homonymous hemianopsia following cerebrovascular accident 06/06/2014  . Acute left hemiparesis 05/04/2014  . Stroke 04/30/2014  . Spastic hemiplegia affecting dominant side 10/01/2013  . Aphasia as late effect of cerebrovascular accident 10/01/2013  . 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 Dehydration 06/07/2013  . Hypokalemia 06/07/2013  . Dyslipidemia 06/07/2013  . CVA (cerebral infarction) 06/06/2013  . Diabetes mellitus type 2, uncontrolled 06/06/2013  . Rhabdomyolysis 06/06/2013    [name] 06/28/2014, 4:52 PM     [name], PTA [place] 06/28/2014 4:52 PM Phone: [phone] Fax: [phone]

## 2014-06-28 NOTE — Therapy (Signed)
[facility] [address] [place], [place], [address] Phone: [phone]   Fax:  [phone]  Speech Language Pathology Treatment  Patient Details  Name: Adam Callahan MRN: [id number] Date of Birth: Nov 14, 1960  Encounter Date: 06/28/2014      End of Session - 06/28/14 1447    Visit Number 5   Number of Visits 16   Date for SLP Re-Evaluation 07/25/14   SLP Start Time 74   SLP Stop Time  1358   SLP Time Calculation (min) 40 min      Past Medical History  Diagnosis Date  . Diabetes mellitus   . Hypertension     Past Surgical History  Procedure Laterality Date  . Tee without cardioversion N/A 05/03/2014    Procedure: TRANSESOPHAGEAL ECHOCARDIOGRAM (TEE);  Surgeon: Pixie Casino, MD;  Location: [facility] ENDOSCOPY;  Service: Cardiovascular;  Laterality: N/A;    There were no vitals taken for this visit.  Visit Diagnosis: Aphasia      Subjective Assessment - 06/28/14 1407    Currently in Pain? No/denies       S: "I can't think of the word - I know it though."     ADULT SLP TREATMENT - 06/28/14 1408    General Information   Behavior/Cognition Alert;Cooperative;Pleasant mood;Confused;Requires cueing;Distractible   Treatment Provided   Treatment provided Cognitive-Linquistic   Pain Assessment   Pain Assessment No/denies pain   Cognitive-Linquistic Treatment   Treatment focused on Aphasia   Skilled Treatment Convergent naming req'd occasional min-mod assist. Divergent naming (simple) with usual min-mod assist. Described concrete items with occasional min A.    Assessment / Recommendations / Plan   Plan Continue with current plan of care   Progression Toward Goals   Progression toward goals Progressing toward goals          SLP Education - 06/28/14 1446    Education provided Yes   Education Details compensations for anomia - gestures   [name];Spouse   Methods Explanation   Comprehension Verbalized understanding;Need further instruction          SLP Short Term Goals - 06/28/14 1448    SLP SHORT TERM GOAL #1   Title Pt will use compensations in 8/10 sentence responses with occasional min A   Time 2   Period Weeks   Status On-going   SLP SHORT TERM GOAL #2   Title Pt will utilize memory compensation system (memory log) for recall of past events with occasional min A   Time 2   Period Weeks   Status On-going   SLP SHORT TERM GOAL #3   Title Pt will complete simple naming tasks with occasional min A   Time 2   Period Weeks   Status On-going   SLP SHORT TERM GOAL #4   Title Pt will use attempt compensations for anomia in conversation with rare min A to do so   Time 2   Period Weeks   Status On-going   SLP SHORT TERM GOAL #5   Title demo understanding of CVA education   Time 2   Period Weeks   Status On-going          SLP Long Term Goals - 06/28/14 1449    SLP LONG TERM GOAL #1   Title Pt will utilize memory compensation system (memory log) for recall of past events with rare min A   Time 6   Period Weeks   Status On-going   SLP LONG TERM  GOAL #2   Title Pt will use attempt compensations for anomia in conversation with rare min A to do so   Time 6   Period Weeks   Status On-going   SLP LONG TERM GOAL #3   Title Pt will exhibit >90% intelligibility in min-mod complex conversation   Period Weeks   Status Achieved   SLP LONG TERM GOAL #4   Title Pt will demo knowledge when caregiver to give him meds with modified independence (external reminder)   Status Achieved   SLP LONG TERM GOAL #5   Title LTG number one: Pt will utilize memory compensation system (memory log) for recall of past events with rare min A   Time 6   Status Deferred          Plan - 06/28/14 1447    Clinical Impression Statement Pt presents with cont impaired language (anomia) and cognition skills. He is not utilizing compensations for anomia adequately and  requires cues to do so. Baseline cognitive deficits continue to somewhat hinder pt's performance.   Speech Therapy Frequency 2x / week   Duration --  5 weeks, next week   Treatment/Interventions Compensatory strategies;Internal/external aids;Environmental Environmental consultant;Other (comment);SLP instruction and feedback;Functional tasks   Potential to Achieve Goals Good   Potential Considerations Ability to learn/carryover information;Severity of impairments;Previous level of function   Consulted and Agree with Plan of Care Patient;Family member/caregiver                               Problem List Patient Active Problem List   Diagnosis Date Noted  . Homonymous hemianopsia following cerebrovascular accident 06/06/2014  . Acute left hemiparesis 05/04/2014  . Stroke 04/30/2014  . Spastic hemiplegia affecting dominant side 10/01/2013  . Aphasia as late effect of cerebrovascular accident 10/01/2013  . 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 Dehydration 06/07/2013  . Hypokalemia 06/07/2013  . Dyslipidemia 06/07/2013  . CVA (cerebral infarction) 06/06/2013  . Diabetes mellitus type 2, uncontrolled 06/06/2013  . Rhabdomyolysis 06/06/2013      Adam Callahan, [place], [place]  06/28/2014 2:50 PM  Phone: [phone] Fax: [phone]

## 2014-06-28 NOTE — Therapy (Signed)
[facility] [address] [place], [place], [address] Phone: [phone]   Fax:  [phone]  Occupational Therapy Treatment  Patient Details  Name: Adam Callahan MRN: [id number] Date of Birth: [birth year]  Encounter Date: 06/28/2014      OT End of Session - 06/28/14 1358    Visit Number 3   Number of Visits 17   Date for OT Re-Evaluation 08/22/14  Changed from 07/20/14 secondary to pt not seen after eval on 05/25/14 until 06/24/14 (Pt is private insurance)   Authorization Type BCBS   OT Start Time 1320   OT Stop Time 1402   OT Time Calculation (min) 42 min   Activity Tolerance Patient tolerated treatment well      Past Medical History  Diagnosis Date  . Diabetes mellitus   . Hypertension     Past Surgical History  Procedure Laterality Date  . Tee without cardioversion N/A 05/03/2014    Procedure: TRANSESOPHAGEAL ECHOCARDIOGRAM (TEE);  Surgeon: Pixie Casino, MD;  Location: [facility] ENDOSCOPY;  Service: Cardiovascular;  Laterality: N/A;    There were no vitals taken for this visit.  Visit Diagnosis:  Spastic hemiplegia affecting dominant side  Right spastic hemiparesis  Generalized muscle weakness  Alterations of sensations, late effect of cerebrovascular disease      Subjective Assessment - 06/28/14 1328    Symptoms My ex is helping with my arm stretches (ex wife reports no questions w/ PROM ex's for RUE)   Currently in Pain? No/denies            OT Treatments/Exercises (OP) - 06/28/14 1354    Neurological Re-education Exercises   Other Exercises 1 AA/ROM w/ UE Ranger to work on low range shoulder flexion with elbow extension, horizontal abduction and sh. extension. Pt only able to control at low range. (When attempting to perform a slightly higher range, pt would compensate w/ shoulder IR and supination)   Modalities   Modalities Electrical Stimulation   Electrical Stimulation   Electrical Stimulation  Location Rt dorsal forearm   Electrical Stimulation Action wrist/finger extension   Electrical Stimulation Parameters 50 pps, 248 pw, 10 on/10 off cycle   Electrical Stimulation Goals Neuromuscular facilitation   Manual Therapy   Manual Therapy Passive ROM   Passive ROM In shoulder flexion, and hand flexion and extension (seated)          OT Education - 06/28/14 1358    Education provided No          OT Short Term Goals - 06/28/14 1341    OT SHORT TERM GOAL #1   Title Pt will be Mod I HEP RUE (due 07/24/14)   Baseline Min A and vc's   Status On-going   OT SHORT TERM GOAL #2   Title Pt will be Mod I precautions realted to impaired sensation RUE (due 07/24/14)   Baseline Pt requires VC's    Status On-going   OT SHORT TERM GOAL #3   Title Pt will be Mod I UB hemi dressing techniques seated. (due 07/24/14)   Baseline Min A buttons etc   Status On-going   OT SHORT TERM GOAL #4   Title Pt will be Mod I gross grasp release w/ RUE in prep for increase dindependence with ADL's/self care tasks. (due 07/24/14)   Baseline Min-Mod A    Status On-going          OT Long Term Goals - 06/28/14 1343    OT LONG TERM GOAL #1  Title Pt will be Mod I signs and symptoms of CVA (due 08/22/14)   Status On-going   OT LONG TERM GOAL #2   Title Pt will be Mod I upgraded HEP (due 08/22/14)   Status On-going   OT LONG TERM GOAL #3   Title Pt wil lbe Mod I UB/LB dressing sitting and sit to stand using a/e & hemi-dressing techniques PRN. (due 08/22/14)   Status On-going   OT LONG TERM GOAL #4   Title Pt will use RUE as assist during ADL and functional use R dominant UE 75% of the time or greater for dressing and eating. (due 08/22/14)   Status On-going          Plan - 06/28/14 1401    Clinical Impression Statement Pt tolerating estim well for wrist/finger extensors (no precautions for estim) secondary to pt unable to actively extend fingers. Pt still a fall risk   Plan Continue neuro re-educ. and estim  to RUE, ADLS, STG's due 03/29/32                               Problem List Patient Active Problem List   Diagnosis Date Noted  . Homonymous hemianopsia following cerebrovascular accident 06/06/2014  . Acute left hemiparesis 05/04/2014  . Stroke 04/30/2014  . Spastic hemiplegia affecting dominant side 10/01/2013  . Aphasia as late effect of cerebrovascular accident 10/01/2013  . 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 Dehydration 06/07/2013  . Hypokalemia 06/07/2013  . Dyslipidemia 06/07/2013  . CVA (cerebral infarction) 06/06/2013  . Diabetes mellitus type 2, uncontrolled 06/06/2013  . Rhabdomyolysis 06/06/2013    Adam Callahan 06/28/2014, 2:09 PM  Redmond Baseman, OTR/L 06/28/2014 2:12 PM Phone [phone] FAX [phone]

## 2014-06-30 ENCOUNTER — Ambulatory Visit: Payer: BC Managed Care – PPO | Admitting: Occupational Therapy

## 2014-06-30 ENCOUNTER — Encounter: Payer: Self-pay | Admitting: Physical Therapy

## 2014-06-30 ENCOUNTER — Ambulatory Visit: Payer: BC Managed Care – PPO | Admitting: Speech Pathology

## 2014-06-30 ENCOUNTER — Ambulatory Visit: Payer: BC Managed Care – PPO | Admitting: Physical Therapy

## 2014-06-30 ENCOUNTER — Encounter: Payer: Self-pay | Admitting: Occupational Therapy

## 2014-06-30 DIAGNOSIS — I639 Cerebral infarction, unspecified: Secondary | ICD-10-CM

## 2014-06-30 DIAGNOSIS — R269 Unspecified abnormalities of gait and mobility: Secondary | ICD-10-CM

## 2014-06-30 DIAGNOSIS — I69998 Other sequelae following unspecified cerebrovascular disease: Secondary | ICD-10-CM

## 2014-06-30 DIAGNOSIS — M6281 Muscle weakness (generalized): Secondary | ICD-10-CM

## 2014-06-30 DIAGNOSIS — R41841 Cognitive communication deficit: Secondary | ICD-10-CM

## 2014-06-30 DIAGNOSIS — I69320 Aphasia following cerebral infarction: Secondary | ICD-10-CM | POA: Diagnosis not present

## 2014-06-30 DIAGNOSIS — G8110 Spastic hemiplegia affecting unspecified side: Secondary | ICD-10-CM

## 2014-06-30 DIAGNOSIS — R4701 Aphasia: Secondary | ICD-10-CM

## 2014-06-30 DIAGNOSIS — R209 Unspecified disturbances of skin sensation: Secondary | ICD-10-CM

## 2014-06-30 DIAGNOSIS — G8111 Spastic hemiplegia affecting right dominant side: Secondary | ICD-10-CM

## 2014-06-30 NOTE — Therapy (Signed)
[facility] [address] [place], [place], [address] Phone: [phone]   Fax:  [phone]  Speech Language Pathology Treatment  Patient Details  Name: Adam Callahan MRN: [id number] Date of Birth: [birth year]  Encounter Date: 06/30/2014      End of Session - 06/30/14 1442    Visit Number 6   Number of Visits 16   Date for SLP Re-Evaluation 07/25/14   SLP Start Time 63   SLP Stop Time  1446   SLP Time Calculation (min) 44 min      Past Medical History  Diagnosis Date  . Diabetes mellitus   . Hypertension     Past Surgical History  Procedure Laterality Date  . Tee without cardioversion N/A 05/03/2014    Procedure: TRANSESOPHAGEAL ECHOCARDIOGRAM (TEE);  Surgeon: Pixie Casino, MD;  Location: [facility] ENDOSCOPY;  Service: Cardiovascular;  Laterality: N/A;    There were no vitals taken for this visit.  Visit Diagnosis: Cognitive communication deficit  Aphasia      Subjective Assessment - 06/30/14 1407    Symptoms "I just answer his questions" re what are you doing in [place]. Caregiver reports she will get a med organizer   Currently in Pain? No/denies            ADULT SLP TREATMENT - 06/30/14 1410    General Information   Behavior/Cognition Alert;Cooperative;Pleasant mood;Confused;Requires cueing;Distractible   Treatment Provided   Treatment provided Cognitive-Linquistic   Pain Assessment   Pain Assessment No/denies pain   Cognitive-Linquistic Treatment   Treatment focused on Aphasia;Cognition   Skilled Treatment Pt verbalized memory strategies for meds. Plan to get a med organizer and use timer to request AM meds from caregiver. Category naming 5 items  with usual min to mod A. Generate multiple meaning sentences with 70% accuracy and usual moderate cues.    Assessment / Recommendations / Plan   Plan Continue with current plan of care   Progression Toward Goals   Progression toward goals Progressing toward  goals          SLP Education - 06/30/14 1441    Education provided Yes   Education Details naming compensations,    [name];Spouse   Methods Explanation   Comprehension Verbalized understanding          SLP Short Term Goals - 06/30/14 1446    SLP SHORT TERM GOAL #1   Title Pt will use compensations in 8/10 sentence responses with occasional min A   Time 2   Period Weeks   Status On-going   SLP SHORT TERM GOAL #2   Title Pt will utilize memory compensation system (memory log) for recall of past events with occasional min A   Time 2   Period Weeks   Status On-going   SLP SHORT TERM GOAL #3   Title Pt will complete simple naming tasks with occasional min A   Time 2   Period Weeks   Status On-going   SLP SHORT TERM GOAL #4   Title Pt will use attempt compensations for anomia in conversation with rare min A to do so   Time 2   Period Weeks   Status On-going   SLP SHORT TERM GOAL #5   Title demo understanding of CVA education   Time 2   Period Weeks   Status On-going          SLP Long Term Goals - 06/30/14 1447    SLP LONG TERM GOAL #1  Title Pt will utilize memory compensation system (memory log) for recall of past events with rare min A   Time 6   Period Weeks   Status On-going   SLP LONG TERM GOAL #2   Title Pt will use attempt compensations for anomia in conversation with rare min A to do so   Time 6   Period Weeks   Status On-going   SLP LONG TERM GOAL #3   Title Pt will exhibit >90% intelligibility in min-mod complex conversation   Time 6   Period Weeks   Status Achieved          Plan - 06/30/14 1442    Clinical Impression Statement Pt presents with cont impaired language (anomia) and cognition skills. He is not utilizing compensations for anomia adequately and requires cues to do so. Baseline cognitive deficits continue to somewhat hinder pt's performance.   Speech Therapy Frequency 2x / week   Potential to Achieve Goals  Good   Potential Considerations Ability to learn/carryover information;Severity of impairments;Previous level of function   Consulted and Agree with Plan of Care Patient;Family member/caregiver                               Problem List Patient Active Problem List   Diagnosis Date Noted  . Homonymous hemianopsia following cerebrovascular accident 06/06/2014  . Acute left hemiparesis 05/04/2014  . Stroke 04/30/2014  . Spastic hemiplegia affecting dominant side 10/01/2013  . Aphasia as late effect of cerebrovascular accident 10/01/2013  . 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 Dehydration 06/07/2013  . Hypokalemia 06/07/2013  . Dyslipidemia 06/07/2013  . CVA (cerebral infarction) 06/06/2013  . Diabetes mellitus type 2, uncontrolled 06/06/2013  . Rhabdomyolysis 06/06/2013    Katurah Karapetian, Annye Rusk 06/30/2014, 2:49 PM  Georgiann Hahn, [place], [place] 06/30/2014 2:49 PM Phone: [phone] Fax: [phone]

## 2014-06-30 NOTE — Patient Instructions (Signed)
Divergent naming homework provided. Workbook for Aphasia pp 94 & 95.

## 2014-06-30 NOTE — Therapy (Signed)
[facility] [address] [place], [place], [address] Phone: [phone]   Fax:  [phone]  Physical Therapy Treatment  Patient Details  Name: Adam Callahan MRN: [id number] Date of Birth: [birth year]  Encounter Date: 06/30/2014      PT End of Session - 06/30/14 1711    Visit Number 8   Number of Visits 16   Date for PT Re-Evaluation 08/06/14   PT Start Time 1450   PT Stop Time 1530   PT Time Calculation (min) 40 min   Equipment Utilized During Treatment Gait belt   Activity Tolerance Patient tolerated treatment well   Behavior During Therapy [facility] for tasks assessed/performed      Past Medical History  Diagnosis Date  . Diabetes mellitus   . Hypertension     Past Surgical History  Procedure Laterality Date  . Tee without cardioversion N/A 05/03/2014    Procedure: TRANSESOPHAGEAL ECHOCARDIOGRAM (TEE);  Surgeon: Pixie Casino, MD;  Location: [facility] ENDOSCOPY;  Service: Cardiovascular;  Laterality: N/A;    There were no vitals taken for this visit.  Visit Diagnosis:  Generalized muscle weakness  Abnormality of gait      Subjective Assessment - 06/30/14 1701    Symptoms No c/o.  "I want to do what ever I can to walk like you do".   Currently in Pain? No/denies            [facility] Adult PT Treatment/Exercise - 06/30/14 1702    Transfers   Transfers Sit to Stand;Stand to Sit   Transfers Yes   Sit to Stand 5: Supervision;With upper extremity assist;From chair/3-in-1   Sit to Stand Details (indicate cue type and reason) cues for equal weight bearing   Stand to Sit 5: Supervision   Stand to Sit Details cues for safety   Ambulation/Gait   Ambulation/Gait Yes   Ambulation/Gait Assistance 4: Min guard;Other (comment)  min assist for several bouts of RLE catching   Ambulation/Gait Assistance Details Pt did not wear AFO into clinic today   Ambulation Distance (Feet) 330 Feet  230 x 2   Assistive device Large base  quad cane   Gait Pattern Decreased stance time - right;Decreased weight shift to right;Decreased trunk rotation;Lateral trunk lean to left;Decreased dorsiflexion - right;Trunk rotated posteriorly on right;Right steppage   Knee/Hip Exercises: Stretches   Gastroc Stretch 3 reps;60 seconds   Gastroc Stretch Limitations performed by PTA in sitting   Knee/Hip Exercises: Standing   Other Standing Knee Exercises standing in parallel bars stepping forward/backward with LLE for RLE control.  Needs assist to prevent RLE hyperextension.   Ambulation   Ambulation/Gait Assistance Details Tactile cues for weight shifting;Tactile cues for posture;Manual facilitation for weight shifting;Other (comment)  Ambulation in parallel bars with 1 UE assist          PT Education - 06/30/14 1710    Education provided Yes   Education Details equal weight bearing with gait   [name]   Methods Explanation;Demonstration;Tactile cues   Comprehension Verbalized understanding            Plan - 06/30/14 1712    Clinical Impression Statement Continues to respond well to feedback during session and motivated to improve mobility   Pt will benefit from skilled therapeutic intervention in order to improve on the following deficits Abnormal gait;Decreased coordination;Difficulty walking;Decreased safety awareness;Decreased endurance;Decreased activity tolerance;Decreased knowledge of precautions;Impaired tone;Decreased knowledge of use of DME;Decreased balance;Decreased cognition;Decreased mobility;Decreased strength;Impaired perceived functional ability   Rehab  Potential Good   PT Frequency 2x / week   PT Duration 8 weeks   PT Treatment/Interventions ADLs/Self Care Home Management;Electrical Stimulation;Moist Heat;Cryotherapy;Balance training;DME Instruction;Manual techniques;Therapeutic exercise;Therapeutic activities;Patient/family education;Passive range of motion;Functional mobility  training;Ultrasound;Stair training;Cognitive remediation;Gait training;Neuromuscular re-education   PT Next Visit Plan Trial of gait with RW and RUE splint to improve equal weight bearing.  Continue neuro reeducation.   Consulted and Agree with Plan of Care Patient       Problem List Patient Active Problem List   Diagnosis Date Noted  . Homonymous hemianopsia following cerebrovascular accident 06/06/2014  . Acute left hemiparesis 05/04/2014  . Stroke 04/30/2014  . Spastic hemiplegia affecting dominant side 10/01/2013  . Aphasia as late effect of cerebrovascular accident 10/01/2013  . 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 Dehydration 06/07/2013  . Hypokalemia 06/07/2013  . Dyslipidemia 06/07/2013  . CVA (cerebral infarction) 06/06/2013  . Diabetes mellitus type 2, uncontrolled 06/06/2013  . Rhabdomyolysis 06/06/2013    [name] 06/30/2014, 5:17 PM     [name], PTA [place] 06/30/2014 5:17 PM Phone: [phone] Fax: [phone]

## 2014-06-30 NOTE — Therapy (Signed)
[facility] [address] [place], [place], [address] Phone: [phone]   Fax:  [phone]  Occupational Therapy Treatment  Patient Details  Name: Adam Callahan MRN: [id number] Date of Birth: 1960/11/07  Encounter Date: 06/30/2014      OT End of Session - 06/30/14 1556    Visit Number 4   Number of Visits 17   Date for OT Re-Evaluation 08/22/14   Authorization Type BCBS   OT Start Time 1325   OT Stop Time 1405   OT Time Calculation (min) 40 min   Activity Tolerance Patient tolerated treatment well      Past Medical History  Diagnosis Date  . Diabetes mellitus   . Hypertension     Past Surgical History  Procedure Laterality Date  . Tee without cardioversion N/A 05/03/2014    Procedure: TRANSESOPHAGEAL ECHOCARDIOGRAM (TEE);  Surgeon: Pixie Casino, MD;  Location: [facility] ENDOSCOPY;  Service: Cardiovascular;  Laterality: N/A;    There were no vitals taken for this visit.  Visit Diagnosis:  Spastic hemiplegia affecting dominant side  Right spastic hemiparesis  Alterations of sensations, late effect of cerebrovascular disease  CVA (cerebral vascular accident)      Subjective Assessment - 06/30/14 1335    Symptoms I'm running late b/c of my transportation" (ex wife)   Patient Stated Goals "I want to get well, I want to use that right hand b/c I draw, I want that."    Currently in Pain? No/denies            OT Treatments/Exercises (OP) - 06/30/14 1359    ADLs   LB Dressing Pt/caregiver shown how to correctly use shoe buttons    ADL Comments Discussion w/ pt/caregiver about long term needs for meal prep, grocery shopping, etc. Encouraged pt to start thinking about this for his own safety (pt's ex wife currently staying with him, but this is not a long term plan per ex wife). Recommended possibly getting an aide to come for 2-3 hours/daily. Also encouraged pt to get Lif[employer] (due to fall risk). Pt reports being  mod I for all BADLS. However, needs assist for all IADLS.    Neurological Re-education Exercises   Other Exercises 1 AA/ROM w/ UE Ranger to work on low range sh.flexion w/ elbow extension, horizontal abduction, and sh. extension. Progressed to AA/ROM w/ PVC frame to slide along table w/ min assist to hold Rt hand around frame.    Acupuncturist Location Rt dorsal forearm  x 10 min. at end of session    Electrical Stimulation Action wrist and finger extension   Electrical Stimulation Parameters 50 pps, 248 pw, 10 sec on/off cycle   Electrical Stimulation Goals Neuromuscular facilitation   Manual Therapy   Passive ROM Therapist performed to pt's RUE in shoulder flexion, forearm supination and finger flexion and extension                Plan - 06/30/14 1557    Clinical Impression Statement Pt progressing towards STG's.    Plan Continue Neuro re-educ., estim Rt hand/wrist, ADLS, STG's due 0/8/14                               Problem List Patient Active Problem List   Diagnosis Date Noted  . Homonymous hemianopsia following cerebrovascular accident 06/06/2014  . Acute left hemiparesis 05/04/2014  . Stroke 04/30/2014  . Spastic hemiplegia affecting  dominant side 10/01/2013  . Aphasia as late effect of cerebrovascular accident 10/01/2013  . 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 Dehydration 06/07/2013  . Hypokalemia 06/07/2013  . Dyslipidemia 06/07/2013  . CVA (cerebral infarction) 06/06/2013  . Diabetes mellitus type 2, uncontrolled 06/06/2013  . Rhabdomyolysis 06/06/2013    Carey Bullocks 06/30/2014, 4:04 PM  Redmond Baseman, OTR/L 06/30/2014 4:05 PM Phone [phone] FAX [phone]

## 2014-07-04 ENCOUNTER — Ambulatory Visit: Payer: Self-pay | Admitting: Physical Medicine & Rehabilitation

## 2014-07-04 ENCOUNTER — Encounter: Payer: BC Managed Care – PPO | Attending: Physical Medicine & Rehabilitation

## 2014-07-04 DIAGNOSIS — G8110 Spastic hemiplegia affecting unspecified side: Secondary | ICD-10-CM | POA: Insufficient documentation

## 2014-07-04 DIAGNOSIS — H53469 Homonymous bilateral field defects, unspecified side: Secondary | ICD-10-CM | POA: Insufficient documentation

## 2014-07-04 DIAGNOSIS — I69920 Aphasia following unspecified cerebrovascular disease: Secondary | ICD-10-CM | POA: Insufficient documentation

## 2014-07-04 DIAGNOSIS — I69898 Other sequelae of other cerebrovascular disease: Secondary | ICD-10-CM | POA: Insufficient documentation

## 2014-07-05 ENCOUNTER — Ambulatory Visit: Payer: BC Managed Care – PPO

## 2014-07-05 ENCOUNTER — Ambulatory Visit: Payer: BC Managed Care – PPO | Admitting: Physical Therapy

## 2014-07-05 ENCOUNTER — Ambulatory Visit: Payer: BC Managed Care – PPO | Admitting: Occupational Therapy

## 2014-07-05 ENCOUNTER — Encounter: Payer: Self-pay | Admitting: Physical Therapy

## 2014-07-05 DIAGNOSIS — R41841 Cognitive communication deficit: Secondary | ICD-10-CM

## 2014-07-05 DIAGNOSIS — M6281 Muscle weakness (generalized): Secondary | ICD-10-CM

## 2014-07-05 DIAGNOSIS — I69320 Aphasia following cerebral infarction: Secondary | ICD-10-CM | POA: Diagnosis not present

## 2014-07-05 DIAGNOSIS — R269 Unspecified abnormalities of gait and mobility: Secondary | ICD-10-CM

## 2014-07-05 DIAGNOSIS — G8110 Spastic hemiplegia affecting unspecified side: Secondary | ICD-10-CM

## 2014-07-05 DIAGNOSIS — I69998 Other sequelae following unspecified cerebrovascular disease: Secondary | ICD-10-CM

## 2014-07-05 DIAGNOSIS — R209 Unspecified disturbances of skin sensation: Secondary | ICD-10-CM

## 2014-07-05 NOTE — Therapy (Signed)
[facility] [address] [place], [place], [address] Phone: [phone]   Fax:  [phone]  Occupational Therapy Treatment  Patient Details  Name: Adam Callahan MRN: [id number] Date of Birth: July 09, 1961  Encounter Date: 07/05/2014      OT End of Session - 07/05/14 1411    Visit Number 5   Number of Visits 17   Date for OT Re-Evaluation 08/22/14   Authorization Type BCBS   OT Start Time 1315   OT Stop Time 1400   OT Time Calculation (min) 45 min   Activity Tolerance Patient tolerated treatment well      Past Medical History  Diagnosis Date  . Diabetes mellitus   . Hypertension     Past Surgical History  Procedure Laterality Date  . Tee without cardioversion N/A 05/03/2014    Procedure: TRANSESOPHAGEAL ECHOCARDIOGRAM (TEE);  Surgeon: Pixie Casino, MD;  Location: [facility] ENDOSCOPY;  Service: Cardiovascular;  Laterality: N/A;    There were no vitals taken for this visit.  Visit Diagnosis:  Spastic hemiplegia affecting dominant side  Alterations of sensations, late effect of cerebrovascular disease  Generalized muscle weakness      Subjective Assessment - 07/05/14 1324    Symptoms The arm is doing ok   Currently in Pain? No/denies            OT Treatments/Exercises (OP) - 07/05/14 1336    ADLs   ADL Comments Pt/caregiver issued handout on stroke warning signs and risk factors and reviewed. Also gave pt/ex-wife information on Lifeline and discussed. Reviewed w/ pt and ex-wife what pt would need in order to safely live alone including assist w/ meals, medication, heavy cleaning, and transportation.    Neurological Re-education Exercises   Other Exercises 1 AA/ROM in low range shoulder flexion and abduction along transfer board w/ min to mod facilitation to prevent compensations. Pt and ex-wife were discussing potential of botox and therapist made recommendation on specific muscle groups to botox.    Acupuncturist Location Rt dorsal forearm   Electrical Stimulation Action wrist and finger extension Rt hand   Electrical Stimulation Parameters 50 pps, 248 pw, 10 sec on/10 sec off cylce x 15 min. at end of session   Electrical Stimulation Goals Neuromuscular facilitation   Manual Therapy   Passive ROM In shoulder flexion and abduction, elbow extension, supination, and wrist/finger extension          OT Education - 07/05/14 1410    Education provided Yes   Education Details CVA warning signs and risk factors, Lifeline information   [name];Caregiver(s)   Methods Explanation;Handout   Comprehension Verbalized understanding          OT Short Term Goals - 07/05/14 1328    OT SHORT TERM GOAL #1   Title Pt will be Mod I HEP RUE (due 07/24/14)   Baseline Min A and vc's   Status On-going   OT SHORT TERM GOAL #2   Title Pt will be Mod I precautions realted to impaired sensation RUE (due 07/24/14)   Baseline Pt requires VC's    Status On-going   OT SHORT TERM GOAL #3   Title Pt will be Mod I UB hemi dressing techniques seated. (due 07/24/14)   Baseline Min A buttons etc   Status On-going   OT SHORT TERM GOAL #4   Title Pt will be Mod I gross grasp release w/ RUE in prep for increase dindependence with ADL's/self care  tasks. (due 07/24/14)   Baseline Min-Mod A    Status On-going          OT Long Term Goals - 07/05/14 1330    OT LONG TERM GOAL #1   Title Pt will be Mod I signs and symptoms of CVA (due 08/22/14)   Status On-going   OT LONG TERM GOAL #2   Title Pt will be Mod I upgraded HEP (due 08/22/14)   Status On-going   OT LONG TERM GOAL #3   Title Pt wil lbe Mod I UB/LB dressing sitting and sit to stand using a/e & hemi-dressing techniques PRN. (due 08/22/14)   Status On-going   OT LONG TERM GOAL #4   Title Pt will use RUE as assist during ADL and functional use R dominant UE 75% of the time or greater for dressing and eating.  (due 08/22/14)   Status On-going          Plan - 07/05/14 1411    Clinical Impression Statement Pt progressing towards all STG's. Pt w/ decreased sensation RUE   Plan supine - PROM RUE, sidelying - AAROM RUE,  snack prep from w/c level (STG's due 07/24/14)                               Problem List Patient Active Problem List   Diagnosis Date Noted  . Homonymous hemianopsia following cerebrovascular accident 06/06/2014  . Acute left hemiparesis 05/04/2014  . Stroke 04/30/2014  . Spastic hemiplegia affecting dominant side 10/01/2013  . Aphasia as late effect of cerebrovascular accident 10/01/2013  . 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 Dehydration 06/07/2013  . Hypokalemia 06/07/2013  . Dyslipidemia 06/07/2013  . CVA (cerebral infarction) 06/06/2013  . Diabetes mellitus type 2, uncontrolled 06/06/2013  . Rhabdomyolysis 06/06/2013    Carey Bullocks 07/05/2014, 2:14 PM  Redmond Baseman, OTR/L 07/05/2014 2:16 PM Phone [phone] FAX [phone]

## 2014-07-05 NOTE — Therapy (Signed)
[facility] [address] [place], [place], [address] Phone: [phone]   Fax:  [phone]  Speech Language Pathology Treatment  Patient Details  Name: Adam Callahan MRN: [id number] Date of Birth: 04/28/1961  Encounter Date: 07/05/2014      End of Session - 07/05/14 1437    Visit Number 7   Number of Visits 16   Date for SLP Re-Evaluation 07/25/14   SLP Start Time 1404   SLP Stop Time  1740   SLP Time Calculation (min) 41 min      Past Medical History  Diagnosis Date  . Diabetes mellitus   . Hypertension     Past Surgical History  Procedure Laterality Date  . Tee without cardioversion N/A 05/03/2014    Procedure: TRANSESOPHAGEAL ECHOCARDIOGRAM (TEE);  Surgeon: Pixie Casino, MD;  Location: [facility] ENDOSCOPY;  Service: Cardiovascular;  Laterality: N/A;    There were no vitals taken for this visit.  Visit Diagnosis: Cognitive communication deficit      Subjective Assessment - 07/05/14 1428    Symptoms Pt and ex-wife [name]) report pt's aphasia has resolved to baseline.    Currently in Pain? No/denies            ADULT SLP TREATMENT - 07/05/14 1430    General Information   Behavior/Cognition Confused;Pleasant mood;Alert;Cooperative   Treatment Provided   Treatment provided Cognitive-Linquistic   Pain Assessment   Pain Assessment No/denies pain   Cognitive-Linquistic Treatment   Treatment focused on Cognition   Skilled Treatment Pt req'd mod cues usually for verbal organization of med ication schedule. Pt/[name] to purchase med organzier in order to A pt in organizing his four meds at three times during the day (including insulin).  Suggested to pt/[name] that pt could ask MD tomorrow during Botox appointment re: insulin in pill form to ease med schedule (all pills). Pt's verbal problem solving was WNL. Attention appears at baseline prior to last CVA.    Assessment / Recommendations / Plan   Plan Continue with  current plan of care  consider reducing frequency/discharge due to baseline status   Progression Toward Goals   Progression toward goals Progressing toward goals            SLP Short Term Goals - 07/05/14 1442    SLP SHORT TERM GOAL #1   Title Pt will use compensations in 8/10 sentence responses with occasional min A   Time 2   Period Weeks   Status Achieved   SLP SHORT TERM GOAL #2   Title Pt will utilize memory compensation system (memory log) for recall of past events with occasional min A   Time 2   Period Weeks   Status Deferred   SLP SHORT TERM GOAL #3   Title Pt will complete simple naming tasks with occasional min A   Time 2   Period Weeks   Status Achieved   SLP SHORT TERM GOAL #4   Title Pt will use attempt compensations for anomia in conversation with rare min A to do so   Time 2   Period Weeks   Status Achieved   SLP SHORT TERM GOAL #5   Title demo understanding of CVA education   Time 2   Period Weeks   Status On-going   Additional Short Term Goals   Additional Short Term Goals Yes   SLP SHORT TERM GOAL #6   Title pt will demo use of medication administration with rare min A (reported by caregiver/pt)  Time 2   Period Weeks   Status New          SLP Long Term Goals - 07/05/14 1444    SLP LONG TERM GOAL #1   Title Pt will utilize medication administration system Engineer, manufacturing systems) with modified independence   Time 6   Period Weeks   Status Revised   SLP LONG TERM GOAL #2   Title Pt will use attempt compensations for anomia in conversation with rare min A to do so   Time 6   Period Weeks   Status Achieved   SLP LONG TERM GOAL #3   Title Pt will exhibit >90% intelligibility in min-mod complex conversation   Time 6   Period Weeks   Status Achieved          Plan - 07/05/14 1438    Clinical Impression Statement Pt's word finding has improved, and is now commensurate with skills premorbidly. [name] and pt agree that a Fish farm manager would be  helpful at this time and will purchase one soon. Consider decr in frequency due to progress.   Speech Therapy Frequency 2x / week   Duration --  5 weeks   Treatment/Interventions Compensatory strategies;Internal/external aids;Environmental controls;SLP instruction and feedback;Functional tasks   Potential to Achieve Goals Good   Potential Considerations Ability to learn/carryover information;Previous level of function           Problem List Patient Active Problem List   Diagnosis Date Noted  . Homonymous hemianopsia following cerebrovascular accident 06/06/2014  . Acute left hemiparesis 05/04/2014  . Stroke 04/30/2014  . Spastic hemiplegia affecting dominant side 10/01/2013  . Aphasia as late effect of cerebrovascular accident 10/01/2013  . 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 Dehydration 06/07/2013  . Hypokalemia 06/07/2013  . Dyslipidemia 06/07/2013  . CVA (cerebral infarction) 06/06/2013  . Diabetes mellitus type 2, uncontrolled 06/06/2013  . Rhabdomyolysis 06/06/2013     Garald Balding, [place], [place]  07/05/2014 2:48 PM  Phone: [phone] Fax: [phone]

## 2014-07-05 NOTE — Therapy (Signed)
[facility] [address] [place], [place], [address] Phone: [phone]   Fax:  [phone]  Physical Therapy Treatment  Patient Details  Name: Adam Callahan MRN: [id number] Date of Birth: 11-Oct-1960  Encounter Date: 07/05/2014      PT End of Session - 07/05/14 2042    Visit Number 9   Number of Visits 16   Date for PT Re-Evaluation 08/06/14   PT Start Time 1450   PT Stop Time 1532   PT Time Calculation (min) 42 min   Equipment Utilized During Treatment Gait belt   Activity Tolerance Patient tolerated treatment well   Behavior During Therapy [facility] for tasks assessed/performed      Past Medical History  Diagnosis Date  . Diabetes mellitus   . Hypertension     Past Surgical History  Procedure Laterality Date  . Tee without cardioversion N/A 05/03/2014    Procedure: TRANSESOPHAGEAL ECHOCARDIOGRAM (TEE);  Surgeon: Pixie Casino, MD;  Location: [facility] ENDOSCOPY;  Service: Cardiovascular;  Laterality: N/A;    There were no vitals taken for this visit.  Visit Diagnosis:  Generalized muscle weakness  Abnormality of gait      Subjective Assessment - 07/05/14 2034    Symptoms Denies falls or changes.   Currently in Pain? No/denies          [facility] Adult PT Treatment/Exercise - 07/05/14 2036    Transfers   Transfers Sit to Stand;Stand to Sit   Transfers Yes   Sit to Stand 5: Supervision;Other/comment  cues for equal weight bearing   Sit to Stand Details (indicate cue type and reason) Leans to Left   Stand to Sit 5: Supervision   Stand to Sit Details cues for safety   Ambulation/Gait   Ambulation/Gait Yes   Ambulation/Gait Assistance 4: Min guard   Ambulation/Gait Assistance Details with R blue rocker.  Cues for heel strike and equal weight bearing bilaterally and to decrease L lean.  Used mirrors as visual cues.   Ambulation Distance (Feet) 220 Feet  x 3   Assistive device Large base quad cane  vs Rolling  walker with RUE held in place with ace bandage   Gait Pattern Decreased stance time - right;Decreased weight shift to right;Decreased trunk rotation;Lateral trunk lean to left;Decreased dorsiflexion - right;Trunk rotated posteriorly on right;Right steppage   Knee/Hip Exercises: Standing   Other Standing Knee Exercises sit-stand x 10 with LLE on 4" block to work on RLE weight bearing.  Needed min assist and verbal cues to weight bear on RLE          PT Education - 07/05/14 2042    Education provided Yes   Education Details equal weight bearing with transfers and ambulation   [name];Caregiver(s)   Methods Explanation;Demonstration   Comprehension Verbalized understanding;Need further instruction           Plan - 07/05/14 2043    Clinical Impression Statement Pt able to decrease L lean with ambulation today with use of RW and visual cues.  Was able to carry over into ambulation with quad cane.  Works well during therapy with little rest breaks needed.   Pt will benefit from skilled therapeutic intervention in order to improve on the following deficits Abnormal gait;Decreased coordination;Difficulty walking;Decreased safety awareness;Decreased endurance;Decreased activity tolerance;Decreased knowledge of precautions;Impaired tone;Decreased knowledge of use of DME;Decreased balance;Decreased cognition;Decreased mobility;Decreased strength;Impaired perceived functional ability   Rehab Potential Good   PT Frequency 2x / week   PT Duration 8  weeks   PT Treatment/Interventions ADLs/Self Care Home Management;Electrical Stimulation;Moist Heat;Cryotherapy;Balance training;DME Instruction;Manual techniques;Therapeutic exercise;Therapeutic activities;Patient/family education;Passive range of motion;Functional mobility training;Ultrasound;Stair training;Cognitive remediation;Gait training;Neuromuscular re-education   PT Next Visit Plan Continue to work on equal weight bearing and  decreasing lateral lean.  RLE strengthening.   Consulted and Agree with Plan of Care Patient      Problem List Patient Active Problem List   Diagnosis Date Noted  . Homonymous hemianopsia following cerebrovascular accident 06/06/2014  . Acute left hemiparesis 05/04/2014  . Stroke 04/30/2014  . Spastic hemiplegia affecting dominant side 10/01/2013  . Aphasia as late effect of cerebrovascular accident 10/01/2013  . 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 Dehydration 06/07/2013  . Hypokalemia 06/07/2013  . Dyslipidemia 06/07/2013  . CVA (cerebral infarction) 06/06/2013  . Diabetes mellitus type 2, uncontrolled 06/06/2013  . Rhabdomyolysis 06/06/2013    Adam Callahan 07/05/2014, 8:50 PM     Adam Callahan, PTA [place] 07/05/2014 8:51 PM Phone: [phone] Fax: [phone]

## 2014-07-05 NOTE — Patient Instructions (Addendum)
  Ask Dr. Letta Pate about medication in pill form, instead of injection for insulin in order to simplify medication administration during the day.  Buy a medication organizer to organize medications for the day/week. You may need someone to fill it for you [name] or your daughter).

## 2014-07-07 ENCOUNTER — Encounter: Payer: Self-pay | Admitting: Physical Therapy

## 2014-07-07 ENCOUNTER — Ambulatory Visit: Payer: BC Managed Care – PPO

## 2014-07-07 ENCOUNTER — Ambulatory Visit: Payer: BC Managed Care – PPO | Admitting: Occupational Therapy

## 2014-07-07 ENCOUNTER — Ambulatory Visit: Payer: BC Managed Care – PPO | Admitting: Physical Therapy

## 2014-07-07 DIAGNOSIS — I69998 Other sequelae following unspecified cerebrovascular disease: Secondary | ICD-10-CM

## 2014-07-07 DIAGNOSIS — R209 Unspecified disturbances of skin sensation: Secondary | ICD-10-CM

## 2014-07-07 DIAGNOSIS — G8110 Spastic hemiplegia affecting unspecified side: Secondary | ICD-10-CM

## 2014-07-07 DIAGNOSIS — M6281 Muscle weakness (generalized): Secondary | ICD-10-CM

## 2014-07-07 DIAGNOSIS — R269 Unspecified abnormalities of gait and mobility: Secondary | ICD-10-CM

## 2014-07-07 DIAGNOSIS — I69320 Aphasia following cerebral infarction: Secondary | ICD-10-CM | POA: Diagnosis not present

## 2014-07-07 DIAGNOSIS — R41841 Cognitive communication deficit: Secondary | ICD-10-CM

## 2014-07-07 NOTE — Therapy (Signed)
[facility] [address] [place], [place], [address] Phone: [phone]   Fax:  [phone]  Speech Language Pathology Treatment  Patient Details  Name: MONTOYA BRANDEL MRN: [id number] Date of Birth: 1960-11-30  Encounter Date: 07/07/2014      End of Session - 07/07/14 1450    Visit Number 8   Number of Visits 16   Date for SLP Re-Evaluation 07/25/14   SLP Start Time 59   SLP Stop Time  1445   SLP Time Calculation (min) 43 min      Past Medical History  Diagnosis Date  . Diabetes mellitus   . Hypertension     Past Surgical History  Procedure Laterality Date  . Tee without cardioversion N/A 05/03/2014    Procedure: TRANSESOPHAGEAL ECHOCARDIOGRAM (TEE);  Surgeon: Pixie Casino, MD;  Location: [facility] ENDOSCOPY;  Service: Cardiovascular;  Laterality: N/A;    There were no vitals taken for this visit.  Visit Diagnosis: Cognitive communication deficit      Subjective Assessment - 07/07/14 1408    Symptoms "His daughter thinks he's taking too much insulin." SLP encouraged pt/[name] to check this with PCP/MD. Pt told [name] to get out the piece of paper with the medicine that SLP wrote last time (SLP diid not write anything last time).   Currently in Pain? No/denies            ADULT SLP TREATMENT - 07/07/14 1413    General Information   Behavior/Cognition Confused;Cooperative;Pleasant mood   Treatment Provided   Treatment provided Cognitive-Linquistic   Pain Assessment   Pain Assessment No/denies pain   Cognitive-Linquistic Treatment   Treatment focused on Cognition   Skilled Treatment Pt and SLP worked together on attention (sustained) by workiing on written language problems re: money. Pt req'd consistent min-mod cues. In counting change, pt attention appropriate 90% of the time, success 88% first attempt. With cue to recount, pt incr'd success to 100%. [name] stated this was much like premorbid status.    Assessment / Recommendations / Plan   Plan --  Reduce to x1/week. Work on Fish farm manager with pt/[name].   Progression Toward Goals   Progression toward goals Progressing toward goals            SLP Short Term Goals - 07/07/14 1452    SLP SHORT TERM GOAL #1   Title Pt will use compensations in 8/10 sentence responses with occasional min A   Period Weeks   Status Achieved   SLP SHORT TERM GOAL #2   Title Pt will utilize memory compensation system (memory log) for recall of past events with occasional min A   Time 2   Period Weeks   Status Deferred   SLP SHORT TERM GOAL #3   Title Pt will complete simple naming tasks with occasional min A   Time 2   Period Weeks   Status Achieved   SLP SHORT TERM GOAL #4   Title Pt will use attempt compensations for anomia in conversation with rare min A to do so   Period Weeks   Status Achieved   SLP SHORT TERM GOAL #5   Title demo understanding of CVA education   Time 2   Period Weeks   Status On-going   SLP SHORT TERM GOAL #6   Title pt will demo use of medication administration with rare min A (reported by caregiver/pt)   Time 1   Period Weeks   Status On-going  SLP Long Term Goals - 07/07/14 1453    SLP LONG TERM GOAL #1   Title Pt will utilize medication administration system Engineer, manufacturing systems) with modified independence   Time 4   Period Weeks   Status Revised   SLP LONG TERM GOAL #2   Title Pt will use attempt compensations for anomia in conversation with rare min A to do so   Period Weeks   Status Achieved   SLP LONG TERM GOAL #3   Title Pt will exhibit >90% intelligibility in min-mod complex conversation   Time 6   Period Weeks   Status Achieved   Additional Long Term Goals   Additional Long Term Goals Yes          Plan - 07/07/14 1451    Clinical Impression Statement Skilled ST needed to cont to work with pt on Automotive engineer of meds with med Environmental education officer. Language resolved. Pt/[name] agree  appropriate to decr frequency to x1/week.   Speech Therapy Frequency 1x /week   Duration 4 weeks   Treatment/Interventions Compensatory strategies;Internal/external aids;Environmental controls;SLP instruction and feedback;Functional tasks   Potential to Achieve Goals Good   Potential Considerations Ability to learn/carryover information;Previous level of function                               Problem List Patient Active Problem List   Diagnosis Date Noted  . Homonymous hemianopsia following cerebrovascular accident 06/06/2014  . Acute left hemiparesis 05/04/2014  . Stroke 04/30/2014  . Spastic hemiplegia affecting dominant side 10/01/2013  . Aphasia as late effect of cerebrovascular accident 10/01/2013  . 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 Dehydration 06/07/2013  . Hypokalemia 06/07/2013  . Dyslipidemia 06/07/2013  . CVA (cerebral infarction) 06/06/2013  . Diabetes mellitus type 2, uncontrolled 06/06/2013  . Rhabdomyolysis 06/06/2013     Garald Balding, [place], [place]  07/07/2014 2:55 PM  Phone: [phone] Fax: [phone]

## 2014-07-07 NOTE — Therapy (Signed)
[facility] [address] [place], [place], [address] Phone: [phone]   Fax:  [phone]  Occupational Therapy Treatment  Patient Details  Name: Adam Callahan MRN: [id number] Date of Birth: January 26, 1961  Encounter Date: 07/07/2014      OT End of Session - 07/07/14 1529    Visit Number 6   Number of Visits 17   Date for OT Re-Evaluation 08/22/14   Authorization Type [employer]   OT Start Time 1500   OT Stop Time 1535   OT Time Calculation (min) 35 min   Activity Tolerance Patient tolerated treatment well      Past Medical History  Diagnosis Date  . Diabetes mellitus   . Hypertension     Past Surgical History  Procedure Laterality Date  . Tee without cardioversion N/A 05/03/2014    Procedure: TRANSESOPHAGEAL ECHOCARDIOGRAM (TEE);  Surgeon: Pixie Casino, MD;  Location: [facility] ENDOSCOPY;  Service: Cardiovascular;  Laterality: N/A;    There were no vitals taken for this visit.  Visit Diagnosis:  Generalized muscle weakness  Spastic hemiplegia affecting dominant side  Alterations of sensations, late effect of cerebrovascular disease      Subjective Assessment - 07/07/14 1500    Symptoms "the arm is good"   Currently in Pain? No/denies            OT Treatments/Exercises (OP) - 07/07/14 1501    Neurological Re-education Exercises   Other Exercises 1 Sidelying: AA/ROM in shoulder flexion/extension (diagonal pattern) w/ mod facilitation for elbow extension and to prevent compensations into IR and abduction. Shoulder protraction/retraction w/ min facilitation for elbow extension and to prevent compensations into abduction and pronation.    Acupuncturist Location Rt dorsal forearm x 10 min.   Electrical Stimulation Action wris/finger extension   Electrical Stimulation Parameters 50 pps, 248 pw, 10 on/10 off cycle   Electrical Stimulation Goals Neuromuscular facilitation   Manual  Therapy   Passive ROM Supine: PROM in shoulder flexion, abduction, circumduction; followed by elbow flex/ext and gross finger extension.             OT Short Term Goals - 07/07/14 1529    OT SHORT TERM GOAL #1   Title Pt will be Mod I HEP RUE (due 07/24/14)   Baseline Min A and vc's   Status On-going   OT SHORT TERM GOAL #2   Title Pt will be Mod I precautions realted to impaired sensation RUE (due 07/24/14)   Baseline Pt requires VC's    Status On-going   OT SHORT TERM GOAL #3   Title Pt will be Mod I UB hemi dressing techniques seated. (due 07/24/14)   Baseline Min A buttons etc   Status On-going   OT SHORT TERM GOAL #4   Title Pt will be Mod I gross grasp release w/ RUE in prep for increase dindependence with ADL's/self care tasks. (due 07/24/14)   Baseline Min-Mod A    Status On-going          OT Long Term Goals - 07/07/14 1530    OT LONG TERM GOAL #1   Title Pt will be Mod I signs and symptoms of CVA (due 08/22/14)   Status On-going   OT LONG TERM GOAL #2   Title Pt will be Mod I upgraded HEP (due 08/22/14)   Status On-going   OT LONG TERM GOAL #3   Title Pt wil lbe Mod I UB/LB dressing sitting and sit to stand  using a/e & hemi-dressing techniques PRN. (due 08/22/14)   Status On-going   OT LONG TERM GOAL #4   Title Pt will use RUE as assist during ADL and functional use R dominant UE 75% of the time or greater for dressing and eating. (due 08/22/14)   Status On-going          Plan - 07/07/14 1530    Clinical Impression Statement Pt progressing towards STG's. Pt still compensates significantly into IR and abduction during low to mid level AA/ROM   Plan Snack prep from w/c level, (STG's due 07/24/14)                               Problem List Patient Active Problem List   Diagnosis Date Noted  . Homonymous hemianopsia following cerebrovascular accident 06/06/2014  . Acute left hemiparesis 05/04/2014  . Stroke 04/30/2014  . Spastic hemiplegia  affecting dominant side 10/01/2013  . Aphasia as late effect of cerebrovascular accident 10/01/2013  . 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 Dehydration 06/07/2013  . Hypokalemia 06/07/2013  . Dyslipidemia 06/07/2013  . CVA (cerebral infarction) 06/06/2013  . Diabetes mellitus type 2, uncontrolled 06/06/2013  . Rhabdomyolysis 06/06/2013    Carey Bullocks 07/07/2014, 3:32 PM  Redmond Baseman, OTR/L 07/07/2014 3:33 PM Phone [phone] FAX [phone]

## 2014-07-07 NOTE — Therapy (Signed)
[facility] [address] [place], [place], [address] Phone: [phone]   Fax:  [phone]  Physical Therapy Treatment  Patient Details  Name: Adam Callahan MRN: [id number] Date of Birth: Aug 01, 1960  Encounter Date: 07/07/2014      PT End of Session - 07/07/14 1556    Visit Number 10   Number of Visits 16   Date for PT Re-Evaluation 08/06/14   PT Start Time 1330   PT Stop Time 1402   PT Time Calculation (min) 32 min   Equipment Utilized During Treatment Gait belt   Activity Tolerance Patient tolerated treatment well   Behavior During Therapy [facility] for tasks assessed/performed      Past Medical History  Diagnosis Date  . Diabetes mellitus   . Hypertension     Past Surgical History  Procedure Laterality Date  . Tee without cardioversion N/A 05/03/2014    Procedure: TRANSESOPHAGEAL ECHOCARDIOGRAM (TEE);  Surgeon: Pixie Casino, MD;  Location: [facility] ENDOSCOPY;  Service: Cardiovascular;  Laterality: N/A;    There were no vitals taken for this visit.  Visit Diagnosis:  Generalized muscle weakness  Abnormality of gait      Subjective Assessment - 07/07/14 1357    Symptoms Denies falls or changes.  Didn't feel like coming to therapy today.   Currently in Pain? No/denies           [facility] Adult PT Treatment/Exercise - 07/07/14 1359    Bed Mobility   Bed Mobility Supine to Sit;Sit to Supine   Supine to Sit 5: Supervision   Sitting - Scoot to Edge of Bed 5: Supervision   Sit to Supine 5: Supervision   Transfers   Transfers Sit to Stand;Stand to Sit;Stand Pivot Transfers   Transfers Yes   Sit to Stand 5: Supervision;From chair/3-in-1;With armrests   Sit to Stand Details (indicate cue type and reason) cues for equal weight bearing   Stand to Sit 5: Supervision   Stand Pivot Transfers 4: Min guard   Stand Pivot Transfer Details (indicate cue type and reason) without device   Ambulation/Gait   Ambulation/Gait Yes   Ambulation/Gait Assistance 5: Supervision   Ambulation/Gait Assistance Details pt did not wear R AFO today   Ambulation Distance (Feet) 120 Feet  twice   Assistive device Small based quad cane   Gait Pattern Right foot flat;Lateral trunk lean to left;Poor foot clearance - right   Lumbar Exercises: Aerobic   Stationary Bike Scifit level 1.5 x bil LE x 10 minutes   Knee/Hip Exercises: Seated   Long Arc Quad Left;AAROM;AROM;2 sets;10 reps  assist for end ROM   Other Seated Knee Exercises RLE seated hip flexion x 10 x 2 sets   Knee/Hip Exercises: Supine   Short Arc Quad Sets Right;2 sets;10 reps  assist for end ROM   Hip Adduction Isometric Left;1 set;10 reps   Bridges Both;1 set;10 reps   Other Supine Knee Exercises RLE bridge x 10            Plan - 07/07/14 1557    Clinical Impression Statement Pt arrived 15 minutes late for appointment today.  Tolerated Scifit well.  Less rest breaks today.   Pt will benefit from skilled therapeutic intervention in order to improve on the following deficits Abnormal gait;Decreased coordination;Difficulty walking;Decreased safety awareness;Decreased endurance;Decreased activity tolerance;Decreased knowledge of precautions;Impaired tone;Decreased knowledge of use of DME;Decreased cognition;Decreased mobility;Decreased strength;Impaired perceived functional ability   Rehab Potential Good   PT Frequency 2x /  week   PT Duration 8 weeks   PT Treatment/Interventions ADLs/Self Care Home Management;Electrical Stimulation;Moist Heat;Cryotherapy;Balance training;DME Instruction;Manual techniques;Therapeutic exercise;Therapeutic activities;Patient/family education;Passive range of motion;Functional mobility training;Ultrasound;Stair training;Cognitive remediation;Gait training;Neuromuscular re-education   PT Next Visit Plan Check STG's.   Consulted and Agree with Plan of Care Patient;Family member/caregiver   Family Member Consulted ex  wife        Problem List Patient Active Problem List   Diagnosis Date Noted  . Homonymous hemianopsia following cerebrovascular accident 06/06/2014  . Acute left hemiparesis 05/04/2014  . Stroke 04/30/2014  . Spastic hemiplegia affecting dominant side 10/01/2013  . Aphasia as late effect of cerebrovascular accident 10/01/2013  . 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 Dehydration 06/07/2013  . Hypokalemia 06/07/2013  . Dyslipidemia 06/07/2013  . CVA (cerebral infarction) 06/06/2013  . Diabetes mellitus type 2, uncontrolled 06/06/2013  . Rhabdomyolysis 06/06/2013    [name] 07/07/2014, 4:00 PM  [place] [address] [place], [place], [address] Phone: [phone]   Fax:  [place], [place] 07/07/2014 4:01 PM Phone: [phone] Fax: [phone]

## 2014-07-07 NOTE — Patient Instructions (Signed)
GET THE MED ORGANIZER AND BRING IT TO THERAPY NEXT VISIT.

## 2014-07-08 ENCOUNTER — Ambulatory Visit (INDEPENDENT_AMBULATORY_CARE_PROVIDER_SITE_OTHER): Payer: BC Managed Care – PPO | Admitting: Neurology

## 2014-07-08 ENCOUNTER — Encounter: Payer: Self-pay | Admitting: Neurology

## 2014-07-08 VITALS — BP 136/88 | HR 101

## 2014-07-08 DIAGNOSIS — IMO0002 Reserved for concepts with insufficient information to code with codable children: Secondary | ICD-10-CM

## 2014-07-08 DIAGNOSIS — I63339 Cerebral infarction due to thrombosis of unspecified posterior cerebral artery: Secondary | ICD-10-CM

## 2014-07-08 DIAGNOSIS — E1165 Type 2 diabetes mellitus with hyperglycemia: Secondary | ICD-10-CM

## 2014-07-08 DIAGNOSIS — E785 Hyperlipidemia, unspecified: Secondary | ICD-10-CM

## 2014-07-08 NOTE — Progress Notes (Signed)
STROKE NEUROLOGY FOLLOW UP NOTE  NAME: Adam Callahan DOB: 09-12-1960  REASON FOR VISIT: stroke follow up HISTORY FROM: wife and chart  Today we had the pleasure of seeing Adam Callahan in follow-up at our Neurology Clinic. Pt was accompanied by wife.   History Summary Adam Callahan is an 53 y.o. 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 MRI brain revealed acute right PCA territory infarct, patchy involvement most pronounced in the right para hippocampal gyrus. MRA showed diffuse athero including left M2, left PCA, right P2. TEE showed small PFO. Negative DVT. Hypercoagulable work up negative. A1C 11.3 and LDL 136. His stroke was considered as large vessel atherosclerosis given uncontrolled risk factor, medication non-compliance and multiple vessel stenosis on images. He was put on ASA and plavix for 3 months as well as statin and discharged to rehab.  Interval History During the interval time, the patient has been doing better. He is doing outpt rehab now and recently saw Dr. Elnita Maxwell for follow up and going to do botox injection on the right side. He still has visual field cut on the right but he stated that it is getting better. He has not setup PCP yet. BP today 136/88 in clinic. Wife said his glucose at home is around 130s.     REVIEW OF SYSTEMS: Full 14 system review of systems performed and notable only for those listed below and in HPI above, all others are negative:  Constitutional: N/A  Cardiovascular: N/A  Ear/Nose/Throat: N/A  Skin: N/A  Eyes: N/A  Respiratory: N/A  Gastroitestinal: N/A  Genitourinary: N/A Hematology/Lymphatic: N/A  Endocrine: N/A  Musculoskeletal: N/A  Allergy/Immunology: N/A  Neurological: N/A  Psychiatric: N/A  The following  represents the patient's updated allergies and side effects list: No Known Allergies  The neurologically relevant items on the patient's problem list were reviewed on today's visit.  Neurologic Examination  A problem focused neurological exam (12 or more points of the single system neurologic examination, vital signs counts as 1 point, cranial nerves count for 8 points) was performed.  Blood pressure 136/88, pulse 101, height 0' (0 m), weight 0 lb (0 kg).  General - Well nourished, well developed, in no apparent distress.  Ophthalmologic - not cooperative on exam.  Cardiovascular - Regular rate and rhythm with no murmur.  Mental Status -  Level of arousal and orientation to place, and person were intact, but not to time and president. Language including expression, naming, repetition, comprehension was assessed and found intact, but dysarthric.  Cranial Nerves II - XII - II - Visual field partial cut on the right. III, IV, VI - Extraocular movements intact. V - Facial sensation decreased on the right. VII - Facial movement showed right facial droop. VIII - Hearing & vestibular intact bilaterally. X - Palate elevates symmetrically, but dysarthric. XI - Chin turning & shoulder shrug intact bilaterally. XII - Tongue protrusion intact.  Motor Strength - The patient's strength was RUE 3/5 proximal and 0/5 distally, RLE 5/5 proximal and 1/5 distally.  Bulk was normal and fasciculations were absent.   Motor Tone - Muscle tone was assessed at the neck and appendages and was increased on the right.  Reflexes - The patient's reflexes were 3+ RUE and RLE and he had no pathological reflexes.  Sensory -  Light touch, temperature/pinprick were decreased on the right.    Coordination - The patient had normal movements in the left hand with no ataxia or dysmetria.  Tremor was absent.  Gait and Station - not tested due to weakness, in wheelchair.  Data reviewed: I personally reviewed the  images and agree with the radiology interpretations.  Mr Brain Wo Contrast 04/29/2014 1. Acute right PCA territory infarct, patchy involvement most pronounced in the right para hippocampal gyrus. Mild edema. No associated hemorrhage or mass effect. 2. Extensive chronic left PCA infarct with encephalomalacia and Wallerian degeneration. Chronic left cerebellar infarct. 3. 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   Mr Adam Callahan Head Wo Contrast 04/30/2014 1. Anatomic variation of the right PCA supply, with "duplicated" fetal PCAs such that the inferior and superior PCA divisions are supplied by separate right posterior communicating arteries. 2. The inferior PCA division is occluded in the "P2" equivalent segment. The superior PCA division remains normal. 3. Chronic occlusion of the left PCA. 4. Interval improved distal left vertebral artery, such that that vessel now appears dominant. 5. Stable anterior circulation with chronic moderate stenosis at the origin of a left MCA M2 branch.   MRI and MRA head and neck 06/07/13  Acute infarct left posterior cerebral artery territory involving the left inferior cerebellum, and left temporal and occipital lobe as well as the left thalamus. There appears to be in embolus the left posterior cerebral artery. Mild amount of hemorrhage in the left posterior inferior cerebellar artery infarct. Occlusions of the left posterior cerebral artery and left posterior inferior cerebellar artery compatible with acute infarct and probable embolism. Moderate to severe focal stenosis in the proximal aspect of the temporal branch of the left middle cerebral artery. Segmental irregularity left vertebral artery most likely a cause for embolus causing acute infarction. Favor dissection given the recent history of a fall. Atherosclerotic disease also possible.  Carotid Doppler Findings suggest 1-39% internal carotid artery stenosis  bilaterally. The left vertebral artery is patent with antegrade flow, unable to visualize the right vertebral artery.  2D echo: Left ventricle: The cavity size was normal. There was mild concentric hypertrophy. Systolic function was normal. The estimated ejection fraction was in the range of 55% to 60%. Although no diagnostic regional wall motion abnormality was identified, this possibility cannot be completely excluded on the basis of this study. Doppler parameters are consistent with abnormal left ventricular relaxation (grade 1 diastolic dysfunction).  CT angio neck Mild atherosclerotic disease involving the right carotid bulb and distal left common carotid artery without significant carotid stenosis. Both vertebral arteries are patent to the basilar without stenosis.   TEE No LAA thrombus Small bidirectional PFO. LVEF 50-55%  Venous doppler - no evidence of DVT.  Component     Latest Ref Rng 04/30/2014 04/30/2014 04/30/2014        12:15 AM 12:30 AM  7:25 PM  PTT Lupus Anticoagulant     28.0 - 43.0 secs     PTTLA Confirmation     <8.0 secs     PTTLA 4:1 Mix     28.0 - 43.0 secs     DRVVT     <42.9 secs     Drvvt confirmation     <1.15 Ratio     dRVVT Incubated 1:1 Mix     <42.9 secs     Lupus Anticoagulant     NOT DETECTED     Cholesterol     0 - 200  mg/dL 195    Triglycerides     <150 mg/dL 88    HDL     >39 mg/dL 41    Total CHOL/HDL Ratio      4.8    VLDL     0 - 40 mg/dL 18    LDL (calc)     0 - 99 mg/dL 136 (H)    Interpretation-PTGENE:          Recommendations-PTGENE:          Reviewer          Anticardiolipin IgG     <23 GPL U/mL     Anticardiolipin IgM     <11 MPL U/mL     Anticardiolipin IgA     <22 APL U/mL     c-ANCA Screen     NEGATIVE     p-ANCA Screen     NEGATIVE     Atypical p-ANCA Screen     NEGATIVE     Beta-2 Glyco I IgG     <20 G Units     Beta-2-Glycoprotein I IgM     <20 M Units     Beta-2-Glycoprotein I IgA     <20 A Units       Hgb A1c MFr Bld     <5.7 %  11.3 (H) 11.1 (H)  Mean Plasma Glucose     <117 mg/dL  278 (H) 272 (H)  ANA Ser Ql     NEGATIVE     C3 Complement     90 - 180 mg/dL     Complement C4, Body Fluid     10 - 40 mg/dL     HIV     NONREACTIVE     Homocysteine     4.0 - 15.4 umol/L     RPR     NON REAC     Sed Rate     0 - 16 mm/hr     SSA (Ro) (ENA) Antibody, IgG     <1.0 NEG AI     SSB (La) (ENA) Antibody, IgG     <1.0 NEG AI     Compl, Total (CH50)     31 - 60 U/mL      Component     Latest Ref Rng 05/03/2014 05/04/2014           PTT Lupus Anticoagulant     28.0 - 43.0 secs  35.0  PTTLA Confirmation     <8.0 secs  NOT APPL  PTTLA 4:1 Mix     28.0 - 43.0 secs  NOT APPL  DRVVT     <42.9 secs  33.0  Drvvt confirmation     <1.15 Ratio  NOT APPL  dRVVT Incubated 1:1 Mix     <42.9 secs  NOT APPL  Lupus Anticoagulant     NOT DETECTED  NOT DETECTED  Cholesterol     0 - 200 mg/dL    Triglycerides     <150 mg/dL    HDL     >39 mg/dL    Total CHOL/HDL Ratio         VLDL     0 - 40 mg/dL    LDL (calc)     0 - 99 mg/dL    Interpretation-PTGENE:      REPORT   Recommendations-PTGENE:      REPORT   Reviewer      REPORT   Anticardiolipin IgG     <23 GPL U/mL 7 (L)  Anticardiolipin IgM     <11 MPL U/mL 1 (L)   Anticardiolipin IgA     <22 APL U/mL 10 (L)   c-ANCA Screen     NEGATIVE NEGATIVE   p-ANCA Screen     NEGATIVE NEGATIVE   Atypical p-ANCA Screen     NEGATIVE NEGATIVE   Beta-2 Glyco I IgG     <20 G Units  0  Beta-2-Glycoprotein I IgM     <20 M Units  3  Beta-2-Glycoprotein I IgA     <20 A Units  5  Hgb A1c MFr Bld     <5.7 %    Mean Plasma Glucose     <117 mg/dL    ANA Ser Ql     NEGATIVE NEGATIVE   C3 Complement     90 - 180 mg/dL 126   Complement C4, Body Fluid     10 - 40 mg/dL 25   HIV     NONREACTIVE NONREACTIVE   Homocysteine     4.0 - 15.4 umol/L 8.4   RPR     NON REAC NON REAC   Sed Rate     0 - 16 mm/hr 10   SSA (Ro) (ENA)  Antibody, IgG     <1.0 NEG AI <1.0 NEG   SSB (La) (ENA) Antibody, IgG     <1.0 NEG AI <1.0 NEG   Compl, Total (CH50)     31 - 60 U/mL  >60 (H)    Assessment: As you may recall, he is a 53 y.o. 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 MRA showed diffuse athero including left M2, left PCA, right P2. TEE showed small PFO. Negative DVT. Hypercoagulable work up negative. A1C 11.3 and LDL 136. His stroke was considered as large vessel atherosclerosis given uncontrolled risk factor, medication non-compliance and multiple vessel stenosis on images. He was put on ASA and plavix for 3 total months. He needs close follow up with PCP for risk factor control and continue PT/OT  Plan:  - continue dural antiplatelet for one more month and then plavix alone. - continue lipitor for stroke prevention - continue PT/OT and home exercise - establish PCP ASAP and follow up with your primary care physician for stroke risk factor modification. Recommend maintain blood pressure goal <130/80, diabetes with hemoglobin A1c goal below 6.5% and lipids with LDL cholesterol goal below 70 mg/dL.  - check BP and glucose at home and record and bring over to PCP for review - RTC in 2 months.  No orders of the defined types were placed in this encounter.    Meds ordered this encounter  Medications  . Blood Glucose Monitoring Suppl (ONE TOUCH ULTRA 2) W/DEVICE KIT    Sig:     Refill:  0  . ONE TOUCH ULTRA TEST test strip    Sig:     Refill:  0  . B-D ULTRAFINE III SHORT PEN 31G X 8 MM MISC    Sig:     Refill:  0  . ONETOUCH DELICA LANCETS 82N MISC    Sig:     Refill:  0    Patient Instructions  - continue ASA and plavix for another month and then plavix  alone - continue PT/OT - establish family  doctor ASAP and Follow up with your primary care physician for stroke risk factor modification. Recommend maintain blood pressure goal <130/80, diabetes with hemoglobin A1c goal below 6.5% and lipids with LDL cholesterol goal below 70 mg/dL.  - continue lipitor for stoke prevention - check BP and glucose at home - exercise your self also - follow up in 2 months.   Rosalin Hawking, MD PhD [facility] [address] [place], [place] [address] [phone]

## 2014-07-08 NOTE — Patient Instructions (Signed)
-   continue ASA and plavix for another month and then plavix alone - continue PT/OT - establish family doctor ASAP and Follow up with your primary care physician for stroke risk factor modification. Recommend maintain blood pressure goal <130/80, diabetes with hemoglobin A1c goal below 6.5% and lipids with LDL cholesterol goal below 70 mg/dL.  - continue lipitor for stoke prevention - check BP and glucose at home - exercise your self also - follow up in 2 months.

## 2014-07-11 ENCOUNTER — Ambulatory Visit: Payer: BC Managed Care – PPO | Admitting: Occupational Therapy

## 2014-07-11 ENCOUNTER — Ambulatory Visit: Payer: BC Managed Care – PPO

## 2014-07-11 ENCOUNTER — Ambulatory Visit: Payer: BC Managed Care – PPO | Admitting: Physical Therapy

## 2014-07-11 ENCOUNTER — Other Ambulatory Visit: Payer: Self-pay | Admitting: Physical Medicine and Rehabilitation

## 2014-07-12 ENCOUNTER — Ambulatory Visit: Payer: BC Managed Care – PPO | Admitting: Physical Therapy

## 2014-07-12 ENCOUNTER — Ambulatory Visit: Payer: BC Managed Care – PPO | Admitting: Occupational Therapy

## 2014-07-12 ENCOUNTER — Ambulatory Visit: Payer: BC Managed Care – PPO

## 2014-07-18 ENCOUNTER — Other Ambulatory Visit: Payer: Self-pay | Admitting: Physical Medicine and Rehabilitation

## 2014-07-19 ENCOUNTER — Ambulatory Visit: Payer: BC Managed Care – PPO | Admitting: Physical Therapy

## 2014-07-19 ENCOUNTER — Encounter: Payer: Self-pay | Admitting: Occupational Therapy

## 2014-07-19 ENCOUNTER — Telehealth: Payer: Self-pay | Admitting: *Deleted

## 2014-07-19 ENCOUNTER — Ambulatory Visit: Payer: BC Managed Care – PPO | Admitting: Occupational Therapy

## 2014-07-19 ENCOUNTER — Ambulatory Visit: Payer: BC Managed Care – PPO

## 2014-07-19 ENCOUNTER — Encounter: Payer: Self-pay | Admitting: Physical Therapy

## 2014-07-19 DIAGNOSIS — I69998 Other sequelae following unspecified cerebrovascular disease: Secondary | ICD-10-CM

## 2014-07-19 DIAGNOSIS — I69320 Aphasia following cerebral infarction: Secondary | ICD-10-CM | POA: Diagnosis not present

## 2014-07-19 DIAGNOSIS — R41841 Cognitive communication deficit: Secondary | ICD-10-CM

## 2014-07-19 DIAGNOSIS — R209 Unspecified disturbances of skin sensation: Secondary | ICD-10-CM

## 2014-07-19 DIAGNOSIS — R471 Dysarthria and anarthria: Secondary | ICD-10-CM

## 2014-07-19 DIAGNOSIS — M6281 Muscle weakness (generalized): Secondary | ICD-10-CM

## 2014-07-19 DIAGNOSIS — R269 Unspecified abnormalities of gait and mobility: Secondary | ICD-10-CM

## 2014-07-19 DIAGNOSIS — G8110 Spastic hemiplegia affecting unspecified side: Secondary | ICD-10-CM
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
## 2014-07-19 NOTE — Telephone Encounter (Signed)
Pt called asking for refill on cholesterol medication and aspirin, I called pt informing that our clinic is specialized towards pain mgmt, I advised that medications should be secured through PCP, family doctor, or urgent care

## 2014-07-19 NOTE — Therapy (Signed)
[facility] [address] [place], [place], [address] Phone: [phone]   Fax:  [phone]  Physical Therapy Treatment  Patient Details  Name: Adam Callahan MRN: [id number] Date of Birth: 28-Aug-1960  Encounter Date: 07/19/2014      PT End of Session - 07/19/14 1247    Visit Number 11   Number of Visits 16  wk of 8   Date for PT Re-Evaluation 08/06/14   PT Start Time 1150   PT Stop Time 1230   PT Time Calculation (min) 40 min   Equipment Utilized During Treatment Gait belt   Activity Tolerance Patient tolerated treatment well      Past Medical History  Diagnosis Date  . Diabetes mellitus   . Hypertension     Past Surgical History  Procedure Laterality Date  . Tee without cardioversion N/A 05/03/2014    Procedure: TRANSESOPHAGEAL ECHOCARDIOGRAM (TEE);  Surgeon: Pixie Casino, MD;  Location: [facility] ENDOSCOPY;  Service: Cardiovascular;  Laterality: N/A;    There were no vitals taken for this visit.  Visit Diagnosis:  Generalized muscle weakness  Abnormality of gait      Subjective Assessment - 07/19/14 1153    Symptoms No falls, denies changes since last visit; [place].     Currently in Pain? No/denies                    [facility] Adult PT Treatment/Exercise - 07/19/14 1211    Ambulation/Gait   Ambulation/Gait Yes   Ambulation/Gait Assistance 5: Supervision   Ambulation Distance (Feet) 125 Feet  ft in 3 minute walk test; 250 ft total   Assistive device Large base quad cane   Gait Pattern Right foot flat;Lateral trunk lean to left;Poor foot clearance - right   Gait velocity 0.89 ft/sec   Standardized Balance Assessment   Standardized Balance Assessment Timed Up and Go Test;Berg Balance Test   Berg Balance Test   Sit to Stand Able to stand without using hands and stabilize independently   Standing Unsupported Able to stand 2 minutes with  supervision   Sitting with Back Unsupported but Feet Supported on Floor or Stool Able to sit safely and securely 2 minutes   Stand to Sit Sits safely with minimal use of hands   Transfers Able to transfer safely, minor use of hands   Standing Unsupported with Eyes Closed Able to stand 10 seconds with supervision   Standing Ubsupported with Feet Together Able to place feet together independently and stand for 1 minute with supervision   From Standing, Reach Forward with Outstretched Arm Can reach forward >12 cm safely (5")   From Standing Position, Pick up Object from Floor Able to pick up shoe, needs supervision   From Standing Position, Turn to Look Behind Over each Shoulder Looks behind one side only/other side shows less weight shift   Turn 360 Degrees Needs close supervision or verbal cueing   Standing Unsupported, Alternately Place Feet on Step/Stool Needs assistance to keep from falling or unable to try   Standing Unsupported, One Foot in Front Able to plae foot ahead of the other independently and hold 30 seconds   Standing on One Leg Tries to lift leg/unable to hold 3 seconds but remains standing independently   Total Score 39   Timed Up and Go Test   TUG Normal TUG   Normal TUG (seconds) 36.37   Knee/Hip Exercises: Standing  Other Standing Knee Exercises Sit<>stand x 10 reps with cues for foot placement for equal weightbearing   Knee/Hip Exercises: Supine   Quad Sets Right;10 reps   Bridges Both;10 reps                PT Education - 07/19/14 1245    Education provided Yes   Education Details plan of care; progress towards goals   [name]   Methods Explanation   Comprehension Verbalized understanding          PT Short Term Goals - 07/19/14 1155    PT SHORT TERM GOAL #1   Title independent with HEP (07/05/14)   Status Not Met   PT SHORT TERM GOAL #2   Title improve BERG balance score to >/= 40/56 for improved balance (07/05/14)   Status Not  Met  Berg balance score 39/56   PT SHORT TERM GOAL #3   Title improve gait velocity to >0.9 ft/sec with LRAD for improved mobility (07/05/14)   Status Achieved  gait velocity 0.89 (0.9 ft/sec)   PT SHORT TERM GOAL #4   Title improve timed up and go to < 40 sec with LRAD for improved mobility (07/05/14)   Status Achieved  36.37 sec           PT Long Term Goals - 06/24/14 1145    PT LONG TERM GOAL #1   Title verbalize understanding of CVA risk factors/warning signs (08/02/14)   Status On-going   PT LONG TERM GOAL #2   Title improve BERG balance score to >/= 45/56 for decreased fall risk (08/02/14)   Status On-going   PT LONG TERM GOAL #3   Title negotiate stairs with LRAD with supervision for improved mobility and community access (08/02/14)   Status On-going   PT LONG TERM GOAL #4   Title improve gait velocity to > 1.31 ft/sec for improved functional mobility (08/02/14)   Status On-going   PT LONG TERM GOAL #5   Title improve timed up and go to < 35 sec with LRAD for improved mobility (08/02/14)   Status On-going               Plan - 07/19/14 1251    Clinical Impression Statement Pt has met LTG #3 and 4.  LTG #1 and 2 not met, though pt progressed to nearly meet Berg Balance score goal.  Pt has not been seen since wk of 07/07/14; therefore STGs checked today.  Pt will continue to benefit from further skilled PT to address neuro-reeducation, strength and gait.   Pt will benefit from skilled therapeutic intervention in order to improve on the following deficits Abnormal gait;Decreased coordination;Difficulty walking;Decreased safety awareness;Decreased endurance;Decreased activity tolerance;Decreased knowledge of precautions;Impaired tone;Decreased knowledge of use of DME;Decreased cognition;Decreased mobility;Decreased strength;Impaired perceived functional ability   Rehab Potential Good   Clinical Impairments Affecting Rehab Potential decreased memory and awareness of deficits    PT Frequency 2x / week   PT Duration 8 weeks  wk 6 of 8   PT Treatment/Interventions ADLs/Self Care Home Management;Electrical Stimulation;Moist Heat;Cryotherapy;Balance training;DME Instruction;Manual techniques;Therapeutic exercise;Therapeutic activities;Patient/family education;Passive range of motion;Functional mobility training;Ultrasound;Stair training;Cognitive remediation;Gait training;Neuromuscular re-education   PT Next Visit Plan update/progress HEP for balance and strength   Consulted and Agree with Plan of Care Patient        Problem List Patient Active Problem List   Diagnosis Date Noted  . 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 Homonymous hemianopsia following  cerebrovascular accident 06/06/2014  . Acute left hemiparesis 05/04/2014  . Stroke 04/30/2014  . Spastic hemiplegia affecting dominant side 10/01/2013  . Aphasia as late effect of cerebrovascular accident 10/01/2013  . 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 Dehydration 06/07/2013  . Hypokalemia 06/07/2013  . Dyslipidemia 06/07/2013  . CVA (cerebral infarction) 06/06/2013  . Diabetes mellitus type 2, uncontrolled 06/06/2013  . Rhabdomyolysis 06/06/2013    MARRIOTT,AMY W. 07/19/2014, 12:56 PM   Amy Marriott, PT 07/19/2014 12:57 PM Phone: [phone] Fax: [phone]   [facility] [address] [place], [place], [address] Phone: [phone]   Fax:  [phone]      

## 2014-07-19 NOTE — Patient Instructions (Signed)
  Please have Rev. Jimmye Norman get med Environmental education officer. This is your last goal in ST.

## 2014-07-19 NOTE — Therapy (Signed)
[facility] [address] [place], [place], [address] Phone: [phone]   Fax:  [phone]  Speech Language Pathology Treatment  Patient Details  Name: Adam Callahan MRN: [id number] Date of Birth: [birth year]  Encounter Date: 07/19/2014      End of Session - 07/19/14 1133    Visit Number 9   Number of Visits 16   Date for SLP Re-Evaluation 07/25/14   SLP Start Time 53   SLP Stop Time  1100   SLP Time Calculation (min) 42 min      Past Medical History  Diagnosis Date  . Diabetes mellitus   . Hypertension     Past Surgical History  Procedure Laterality Date  . Tee without cardioversion N/A 05/03/2014    Procedure: TRANSESOPHAGEAL ECHOCARDIOGRAM (TEE);  Surgeon: Pixie Casino, MD;  Location: [facility] ENDOSCOPY;  Service: Cardiovascular;  Laterality: N/A;    There were no vitals taken for this visit.  Visit Diagnosis: Cognitive communication deficit  Dysarthria      Subjective Assessment - 07/19/14 1032    Symptoms Pt arrived with Adam Callahan. Pt stated, "I don't mess with Adam Callahan" when asked where Adam Callahan was today. "My people taking care of me now."   Currently in Pain? No/denies             ADULT SLP TREATMENT - 07/19/14 1038    General Information   Behavior/Cognition Confused;Distractible;Requires cueing   Treatment Provided   Treatment provided Cognitive-Linquistic   Pain Assessment   Pain Assessment No/denies pain   Cognitive-Linquistic Treatment   Treatment focused on Cognition   Skilled Treatment Difficult getting info on pt's current living situation from pt today. SLP is under the impression that ex-wife no longer with patient. Pt's caregiver is no longer in lobby for SLP to obtain pt's current status. SLP req'd to redirect pt back to topic occasionally. Pt demo'd understanding of CVA warning signs.   Assessment / Recommendations / Plan   Plan --  Talk with Adam Callahan  re: med organizer.   Progression Toward Goals   Progression toward goals Not progressing toward goals (comment)  SLP wrote note for pt/caregiver to get med organizer.            SLP Short Term Goals - 07/19/14 1136    SLP SHORT TERM GOAL #1   Title Pt will use compensations in 8/10 sentence responses with occasional min A   Period Weeks   Status Achieved   SLP SHORT TERM GOAL #2   Title Pt will utilize memory compensation system (memory log) for recall of past events with occasional min A   Time 2   Period Weeks   Status Deferred   SLP SHORT TERM GOAL #3   Title Pt will complete simple naming tasks with occasional min A   Time 2   Period Weeks   Status Achieved   SLP SHORT TERM GOAL #4   Title Pt will use attempt compensations for anomia in conversation with rare min A to do so   Period Weeks   Status Achieved   SLP SHORT TERM GOAL #5   Title demo understanding of CVA education   Time 2   Period Weeks   Status Achieved   SLP SHORT TERM GOAL #6   Title pt will demo use of medication administration with rare min A (reported by caregiver/pt)   Time 1   Period Weeks   Status On-going  SLP Long Term Goals - 07/19/14 1136    SLP LONG TERM GOAL #1   Title Pt will utilize medication administration system Engineer, manufacturing systems) with modified independence   Time 4   Period Weeks   Status Revised   SLP LONG TERM GOAL #2   Title Pt will use attempt compensations for anomia in conversation with rare min A to do so   Period Weeks   Status Achieved   SLP LONG TERM GOAL #3   Title Pt will exhibit >90% intelligibility in min-mod complex conversation   Time 6   Period Weeks   Status Achieved          Plan - 07/19/14 1134    Clinical Impression Statement Pt to obtain med organizer so SLP can assist with having it filled. Pt states he takes meds out of the bottle currently, and family helped him over the weekend.   Speech Therapy Frequency 1x /week   Duration 2 weeks    Treatment/Interventions Compensatory strategies;Internal/external aids;Environmental controls;SLP instruction and feedback;Functional tasks   Potential to Achieve Goals Good   Potential Considerations Ability to learn/carryover information;Previous level of function        Problem List Patient Active Problem List   Diagnosis Date Noted  . 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 Homonymous hemianopsia following cerebrovascular accident 06/06/2014  . Acute left hemiparesis 05/04/2014  . Stroke 04/30/2014  . Spastic hemiplegia affecting dominant side 10/01/2013  . Aphasia as late effect of cerebrovascular accident 10/01/2013  . 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 Dehydration 06/07/2013  . Hypokalemia 06/07/2013  . Dyslipidemia 06/07/2013  . CVA (cerebral infarction) 06/06/2013  . Diabetes mellitus type 2, uncontrolled 06/06/2013  . Rhabdomyolysis 06/06/2013    [facility], SLP 07/19/2014, 11:37 AM  [facility] [address] [place], [place], [address] Phone: [phone]   Fax:  [phone]

## 2014-07-21 ENCOUNTER — Other Ambulatory Visit: Payer: Self-pay | Admitting: *Deleted

## 2014-07-21 ENCOUNTER — Ambulatory Visit: Payer: BC Managed Care – PPO

## 2014-07-21 ENCOUNTER — Telehealth: Payer: Self-pay | Admitting: *Deleted

## 2014-07-21 ENCOUNTER — Ambulatory Visit: Payer: BC Managed Care – PPO | Admitting: Occupational Therapy

## 2014-07-21 ENCOUNTER — Encounter: Payer: Self-pay | Admitting: Physical Therapy

## 2014-07-21 ENCOUNTER — Ambulatory Visit: Payer: BC Managed Care – PPO | Admitting: Physical Therapy

## 2014-07-21 DIAGNOSIS — I69320 Aphasia following cerebral infarction: Secondary | ICD-10-CM | POA: Diagnosis not present

## 2014-07-21 DIAGNOSIS — R209 Unspecified disturbances of skin sensation: Secondary | ICD-10-CM

## 2014-07-21 DIAGNOSIS — R41841 Cognitive communication deficit: Secondary | ICD-10-CM

## 2014-07-21 DIAGNOSIS — M6281 Muscle weakness (generalized): Secondary | ICD-10-CM

## 2014-07-21 DIAGNOSIS — I69998 Other sequelae following unspecified cerebrovascular disease: Secondary | ICD-10-CM

## 2014-07-21 DIAGNOSIS — R269 Unspecified abnormalities of gait and mobility: Secondary | ICD-10-CM

## 2014-07-21 DIAGNOSIS — G8110 Spastic hemiplegia affecting unspecified side: Secondary | ICD-10-CM

## 2014-07-21 DIAGNOSIS — I639 Cerebral infarction, unspecified: Secondary | ICD-10-CM

## 2014-07-21 DIAGNOSIS — R471 Dysarthria and anarthria: Secondary | ICD-10-CM

## 2014-07-21 MED ORDER — CLOPIDOGREL BISULFATE 75 MG PO TABS: 75.0000 mg | ORAL_TABLET | Freq: Every day | ORAL | Status: DC

## 2014-07-21 MED ORDER — ATORVASTATIN CALCIUM 20 MG PO TABS: 20.0000 mg | ORAL_TABLET | Freq: Every day | ORAL | Status: DC

## 2014-07-21 NOTE — Therapy (Signed)
[facility] [address] [place], [place], [address] Phone: [phone]   Fax:  [phone]  Occupational Therapy Treatment  Patient Details  Name: Adam Callahan MRN: [id number] Date of Birth: 07/18/1961  Encounter Date: 07/21/2014      OT End of Session - 07/21/14 1443    Visit Number 8   Number of Visits 17   Date for OT Re-Evaluation 08/22/14   Authorization Type [employer], no visit limit   OT Start Time 1405   OT Stop Time 1445   OT Time Calculation (min) 40 min   Activity Tolerance Patient tolerated treatment well      Past Medical History  Diagnosis Date  . Diabetes mellitus   . Hypertension     Past Surgical History  Procedure Laterality Date  . Tee without cardioversion N/A 05/03/2014    Procedure: TRANSESOPHAGEAL ECHOCARDIOGRAM (TEE);  Surgeon: Pixie Casino, MD;  Location: [facility] ENDOSCOPY;  Service: Cardiovascular;  Laterality: N/A;    There were no vitals taken for this visit.  Visit Diagnosis:  Spastic hemiplegia affecting dominant side  Alterations of sensations, late effect of cerebrovascular disease  Generalized muscle weakness  CVA (cerebral vascular accident)      Subjective Assessment - 07/21/14 1405    Symptoms "My New Year's resolution is to walk again"   Currently in Pain? No/denies                 OT Treatments/Exercises (OP) - 07/21/14 1406    ADLs   ADL Comments snack prep from w/c level: Pt asked to make sandwich and required min cues for safety and to encourage pt to participate in activity. Pt required extra time to perform task. Pt also given [employer] again. Discussed safety and precautions w/ RUE secondary to decreased sensation. Assessed remaining STG's.                 OT Education - 07/21/14 1442    Education provided Yes   Education Details Re-issued hemiplegic stretches HEP and briefly discussed, re-issued Environmental health practitioner) Educated Patient   Methods Explanation   Comprehension Verbalized understanding          OT Short Term Goals - 07/21/14 1444    OT SHORT TERM GOAL #1   Title Pt will be Mod I HEP RUE (due 07/24/14)   Status Achieved   OT SHORT TERM GOAL #2   Title Pt will be Mod I precautions realted to impaired sensation RUE (due 07/24/14)   Status Achieved   OT SHORT TERM GOAL #3   Title Pt will be Mod I UB hemi dressing techniques seated. (due 07/24/14)   Status Achieved  met per pt report   OT SHORT TERM GOAL #4   Title Pt will be Mod I gross grasp release w/ RUE in prep for increase dindependence with ADL's/self care tasks. (due 07/24/14)   Status Not Met  min-mod A           OT Long Term Goals - 07/07/14 1530    OT LONG TERM GOAL #1   Title Pt will be Mod I signs and symptoms of CVA (due 08/22/14)   Status On-going   OT LONG TERM GOAL #2   Title Pt will be Mod I upgraded HEP (due 08/22/14)   Status On-going   OT LONG TERM GOAL #3   Title Pt wil lbe Mod I UB/LB dressing sitting and sit to stand using a/e &  hemi-dressing techniques PRN. (due 08/22/14)   Status On-going   OT LONG TERM GOAL #4   Title Pt will use RUE as assist during ADL and functional use R dominant UE 75% of the time or greater for dressing and eating. (due 08/22/14)   Status On-going               Plan - 07/21/14 1445    Clinical Impression Statement Pt met STG's #1 and 2 today. Pt met STG #3 last session. STG #4 not met (requires min to mod assist). Therapist concerned about pt now living alone and made recommendations for [employer] secondary to pt adimant about living alone and a high fall risk.    Plan fabricate resting hand splint        Problem List Patient Active Problem List   Diagnosis Date Noted  . 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 Homonymous hemianopsia following cerebrovascular accident 06/06/2014  . Acute left hemiparesis 05/04/2014  . Stroke  04/30/2014  . Spastic hemiplegia affecting dominant side 10/01/2013  . Aphasia as late effect of cerebrovascular accident 10/01/2013  . 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 Dehydration 06/07/2013  . Hypokalemia 06/07/2013  . Dyslipidemia 06/07/2013  . CVA (cerebral infarction) 06/06/2013  . Diabetes mellitus type 2, uncontrolled 06/06/2013  . Rhabdomyolysis 06/06/2013    Carey Bullocks, OTR/L 07/21/2014, 2:48 PM  [facility] [address] [place], [place], [address] Phone: [phone]   Fax:  [phone]

## 2014-07-21 NOTE — Patient Instructions (Addendum)
I have contacted Dr. Letta Pate via email and asked him if it is at all possible to have your insulin via pill form.  It's good that your daughter and friends are helping you fill your medication box and checking to see if you have taken your medications. If you are unsure about what to take, call your daughter or [name].

## 2014-07-21 NOTE — Therapy (Signed)
[facility] [address] [place], [place], [address] Phone: [phone]   Fax:  [phone]  Speech Language Pathology Treatment  Patient Details  Name: Adam Callahan MRN: [id number] Date of Birth: 1960-10-01  Encounter Date: 07/21/2014      End of Session - 07/21/14 1521    Visit Number 10   Number of Visits 16   Date for SLP Re-Evaluation 07/25/14   SLP Start Time 1450   SLP Stop Time  1528   SLP Time Calculation (min) 38 min      Past Medical History  Diagnosis Date  . Diabetes mellitus   . Hypertension     Past Surgical History  Procedure Laterality Date  . Tee without cardioversion N/A 05/03/2014    Procedure: TRANSESOPHAGEAL ECHOCARDIOGRAM (TEE);  Surgeon: Pixie Casino, MD;  Location: [facility] ENDOSCOPY;  Service: Cardiovascular;  Laterality: N/A;    There were no vitals taken for this visit.  Visit Diagnosis: Cognitive communication deficit  Dysarthria      Subjective Assessment - 07/21/14 1510    Symptoms SLP asked [name] if he has got new medication organizer and pt obtained one since last visit.   Currently in Pain? No/denies             ADULT SLP TREATMENT - 07/21/14 1511    General Information   Behavior/Cognition Confused;Cooperative;Pleasant mood   Treatment Provided   Treatment provided Cognitive-Linquistic   Pain Assessment   Pain Assessment No/denies pain   Cognitive-Linquistic Treatment   Treatment focused on Cognition   Skilled Treatment Pt reports [name] put [employer] in Environmental education officer for him, but that insulin is a challenge for administration if pt is alone. He suggested asking MD if pt could take insulin via pill form. He told SLP plausible and feasible solutions to medicaiton administration problems.    Assessment / Recommendations / Plan   Plan Discharge SLP treatment due to (comment)  met goals possble currently. At baseline speech/cognition.    Progression Toward Goals   Progression toward goals Goals met, education completed, patient discharged from [facility] - 07/21/14 [place] #1   Title Pt will use compensations in 8/10 sentence responses with occasional min A   Status Achieved   SLP SHORT TERM GOAL #2   Title Pt will utilize memory compensation system (memory log) for recall of past events with occasional min A   Status Deferred   SLP SHORT TERM GOAL #3   Title Pt will complete simple naming tasks with occasional min A   Status Achieved   SLP SHORT TERM GOAL #4   Title Pt will use attempt compensations for anomia in conversation with rare min A to do so   Status Achieved   SLP SHORT TERM GOAL #5   Title demo understanding of CVA education   Status Achieved   SLP SHORT TERM GOAL #6   Title pt will demo use of medication administration with rare min A (reported by caregiver/pt)   Status Achieved          SLP Long Term Goals - 07/21/14 1523    SLP LONG TERM GOAL #1   Title Pt will utilize medication administration system Engineer, manufacturing systems) with modified independence   Time 4   Period Weeks   Status Not Met   SLP LONG TERM GOAL #2   Title  Pt will use attempt compensations for anomia in conversation with rare min A to do so   Period Weeks   Status Achieved   SLP LONG TERM GOAL #3   Title Pt will exhibit >90% intelligibility in min-mod complex conversation   Time 6   Period Weeks   Status Achieved          Problem List Patient Active Problem List   Diagnosis Date Noted  . 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 Homonymous hemianopsia following cerebrovascular accident 06/06/2014  . Acute left hemiparesis 05/04/2014  . Stroke 04/30/2014  . Spastic hemiplegia affecting dominant side 10/01/2013  . Aphasia as late effect of cerebrovascular accident 10/01/2013  . Alterations of sensations, late effect of  cerebrovascular disease(438.6) 10/01/2013  . 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 Dehydration 06/07/2013  . Hypokalemia 06/07/2013  . Dyslipidemia 06/07/2013  . CVA (cerebral infarction) 06/06/2013  . Diabetes mellitus type 2, uncontrolled 06/06/2013  . Rhabdomyolysis 06/06/2013        SPEECH THERAPY DISCHARGE SUMMARY  Visits from Start of Care: 10  Current functional level related to goals / functional outcomes: Pt is at baseline for cognition as well as language and dysarthria. He met all STGs and LTGs except medication administration with modified assistance. He still requires assistance for med times, however has friends and family assisting with this. Notification to his MD has been made re: possible to change insulin to another means other than injection due to difficulty with manipulating with one hand. Pt obtained a new med Environmental education officer filled by friend [name]).   Remaining deficits: Cognitive deficits, at baseline level before last CVA.    Education / Equipment: Memory strategies, medication administration hints/helps.  Plan: Patient agrees to discharge.  Patient goals were partially met. Patient is being discharged due to                                                     ?????reaching max levels at this time.       Clifford 07/21/2014, 4:55 PM  [facility] [address] [place], [place], [address] Phone: [phone]   Fax:  [phone]

## 2014-07-21 NOTE — Telephone Encounter (Signed)
Pt called, unable to get into see Dr. Joseph Art as he is not taking new patients. Pt is in dire need of cholesterol medication (Plavix, Lipitor). I consulted with Johnette and she advised that we refill the medication out of medical necessity. Pt will be in to see Dr. Letta Pate on 07/26/2014 for a Botox procedure. I advised pt that this would be a good time to speak with Dr. Letta Pate about other possible physicians he might see to manage his cholesterol medications. Rx's were sent electronically to pt's preferred pharmacy. Pt called and notified

## 2014-07-21 NOTE — Therapy (Signed)
[facility] [address] [place], [place], [address] Phone: [phone]   Fax:  [phone]  Physical Therapy Treatment  Patient Details  Name: Adam Callahan MRN: [id number] Date of Birth: June 05, 1961  Encounter Date: 07/21/2014      PT End of Session - 07/21/14 1414    Visit Number 12   Number of Visits 16  wk 5 of 8   Date for PT Re-Evaluation 08/06/14   PT Start Time 1318   PT Stop Time 1400   PT Time Calculation (min) 42 min   Equipment Utilized During Treatment Gait belt   Activity Tolerance Patient tolerated treatment well      Past Medical History  Diagnosis Date  . Diabetes mellitus   . Hypertension     Past Surgical History  Procedure Laterality Date  . Tee without cardioversion N/A 05/03/2014    Procedure: TRANSESOPHAGEAL ECHOCARDIOGRAM (TEE);  Surgeon: Pixie Casino, MD;  Location: [facility] ENDOSCOPY;  Service: Cardiovascular;  Laterality: N/A;    There were no vitals taken for this visit.  Visit Diagnosis:  Generalized muscle weakness  Abnormality of gait  Spastic hemiplegia affecting dominant side      Subjective Assessment - 07/21/14 1318    Symptoms Nothing has changed.     Currently in Pain? No/denies        Objective:   Neuro Re-education:  Repetitive sit<>stand x 8 reps from elevated mat height; sit<>stand x 5 reps from mat height with hip adduction (ball squeezes); then sit<>stand x 10 reps with disk under LLE, all with cues/assistance for equal weightbearing/weightshifting through bilateral lower extremities.  Standing at counter:  Standing glut sets x 10 reps for upright posture, lateral weightshifting to increase RLE weightbearing:  LLE step taps to side x 10 reps, then to back x 10reps, with postural cues and cues for glut activation. Tandem stance 3 reps with each foot placement with intermittent UE support and cues for upright posture.  At 6 inch step:  RLE weightbearing-LLE  step taps x 10 reps, static standing position with LLE propped on 6 inch step with terminal R knee extension, with minimal assistance.  Forward step ups on RLE x 10 reps with LUE support with minimal assistance, with tactile and verbal cues for full quad extension.    TherEx:  Seated SciFit Level 1, lower extremities only, with theraband around knees to assist with knee positioning, x 10 minutes, with cues for equal weightbearing through bilateral lower extremities for lower extremity strengthening.  Seated RLE hamstring stretch 2 x 30 seconds with therapist's assistance for foot placement.                       PT Short Term Goals - 07/19/14 1155    PT SHORT TERM GOAL #1   Title independent with HEP (07/05/14)   Status Not Met   PT SHORT TERM GOAL #2   Title improve BERG balance score to >/= 40/56 for improved balance (07/05/14)   Status Not Met  Berg balance score 39/56   PT SHORT TERM GOAL #3   Title improve gait velocity to >0.9 ft/sec with LRAD for improved mobility (07/05/14)   Status Achieved  gait velocity 0.89 (0.9 ft/sec)   PT SHORT TERM GOAL #4   Title improve timed up and go to < 40 sec with LRAD for improved mobility (07/05/14)   Status Achieved  36.37 sec  PT Long Term Goals - 06/24/14 1145    PT LONG TERM GOAL #1   Title verbalize understanding of CVA risk factors/warning signs (08/02/14)   Status On-going   PT LONG TERM GOAL #2   Title improve BERG balance score to >/= 45/56 for decreased fall risk (08/02/14)   Status On-going   PT LONG TERM GOAL #3   Title negotiate stairs with LRAD with supervision for improved mobility and community access (08/02/14)   Status On-going   PT LONG TERM GOAL #4   Title improve gait velocity to > 1.31 ft/sec for improved functional mobility (08/02/14)   Status On-going   PT LONG TERM GOAL #5   Title improve timed up and go to < 35 sec with LRAD for improved mobility (08/02/14)   Status On-going                Plan - 07/21/14 1415    Clinical Impression Statement Pt requires verbal and tactil cues for glut and quad activation to aid in upright posture and equal weightbearing.  Pt continues to demonstrate RLE weakness.  Pt will continue to benefit from further skilled PT to address neuro-reeducation, strength and gait.   Pt will benefit from skilled therapeutic intervention in order to improve on the following deficits Abnormal gait;Decreased coordination;Difficulty walking;Decreased safety awareness;Decreased endurance;Decreased activity tolerance;Decreased knowledge of precautions;Impaired tone;Decreased knowledge of use of DME;Decreased cognition;Decreased mobility;Decreased strength;Impaired perceived functional ability;Decreased balance   Rehab Potential Good   Clinical Impairments Affecting Rehab Potential decreased memory and awareness of deficits   PT Frequency 2x / week   PT Duration 8 weeks  wk 6 of 8   PT Treatment/Interventions ADLs/Self Care Home Management;Electrical Stimulation;Moist Heat;Cryotherapy;Balance training;DME Instruction;Manual techniques;Therapeutic exercise;Therapeutic activities;Patient/family education;Passive range of motion;Functional mobility training;Ultrasound;Stair training;Cognitive remediation;Gait training;Neuromuscular re-education   PT Next Visit Plan update/progress HEP for balance and strength; hamstring stretches   Consulted and Agree with Plan of Care Patient        Problem List Patient Active Problem List   Diagnosis Date Noted  . 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 Homonymous hemianopsia following cerebrovascular accident 06/06/2014  . Acute left hemiparesis 05/04/2014  . Stroke 04/30/2014  . Spastic hemiplegia affecting dominant side 10/01/2013  . Aphasia as late effect of cerebrovascular accident 10/01/2013  . 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 Dehydration 06/07/2013  . Hypokalemia 06/07/2013  . Dyslipidemia 06/07/2013  . CVA (cerebral infarction) 06/06/2013  . Diabetes mellitus type 2, uncontrolled 06/06/2013  . Rhabdomyolysis 06/06/2013    Kaydyn Sayas W. 07/21/2014, 2:23 PM  Tavyn Kurka Gerrit Friends, PT 07/21/2014 2:25 PM Phone: [phone] Fax: [facility] [place] [facility] [address] [place], [place], [address] Phone: [phone]   Fax:  [phone]

## 2014-07-25 NOTE — Telephone Encounter (Signed)
Please refer to community wellness if there is no insurance If there is insurance refer to [employer] primary care- no MD preference

## 2014-07-26 ENCOUNTER — Encounter: Payer: BC Managed Care – PPO | Attending: Physical Medicine & Rehabilitation

## 2014-07-26 ENCOUNTER — Ambulatory Visit (HOSPITAL_BASED_OUTPATIENT_CLINIC_OR_DEPARTMENT_OTHER): Payer: Worker's Compensation | Admitting: Physical Medicine & Rehabilitation

## 2014-07-26 ENCOUNTER — Encounter: Payer: Self-pay | Admitting: Physical Medicine & Rehabilitation

## 2014-07-26 VITALS — BP 160/88 | HR 90 | Resp 16

## 2014-07-26 DIAGNOSIS — G8110 Spastic hemiplegia affecting unspecified side: Secondary | ICD-10-CM | POA: Diagnosis present

## 2014-07-26 DIAGNOSIS — H53469 Homonymous bilateral field defects, unspecified side: Secondary | ICD-10-CM | POA: Diagnosis not present

## 2014-07-26 DIAGNOSIS — I69920 Aphasia following unspecified cerebrovascular disease: Secondary | ICD-10-CM | POA: Insufficient documentation

## 2014-07-26 DIAGNOSIS — I69898 Other sequelae of other cerebrovascular disease: Secondary | ICD-10-CM | POA: Insufficient documentation

## 2014-07-26 NOTE — Patient Instructions (Signed)

## 2014-07-26 NOTE — Progress Notes (Signed)
Botox Injection for spasticity using needle EMG guidance  Dilution: 50 Units/ml Indication: Severe spasticity which interferes with ADL,mobility and/or  hygiene and is unresponsive to medication management and other conservative care Informed consent was obtained after describing risks and benefits of the procedure with the patient. This includes bleeding, bruising, infection, excessive weakness, or medication side effects. A REMS form is on file and signed. Needle: 25g 2" needle electrode Number of units per muscle  FCR50 FCU0 FDS50 FDP50 FPL25 PT25 All injections were done after obtaining appropriate EMG activity and after negative drawback for blood. The patient tolerated the procedure well. Post procedure instructions were given. A followup appointment was made.

## 2014-07-28 ENCOUNTER — Ambulatory Visit: Payer: BC Managed Care – PPO | Attending: Physical Medicine & Rehabilitation | Admitting: Physical Therapy

## 2014-07-28 ENCOUNTER — Encounter: Payer: Self-pay | Admitting: Physical Therapy

## 2014-07-28 DIAGNOSIS — I69320 Aphasia following cerebral infarction: Secondary | ICD-10-CM | POA: Insufficient documentation

## 2014-07-28 DIAGNOSIS — R471 Dysarthria and anarthria: Secondary | ICD-10-CM | POA: Insufficient documentation

## 2014-07-28 DIAGNOSIS — R41841 Cognitive communication deficit: Secondary | ICD-10-CM | POA: Insufficient documentation

## 2014-07-28 DIAGNOSIS — G8110 Spastic hemiplegia affecting unspecified side: Secondary | ICD-10-CM | POA: Insufficient documentation

## 2014-07-28 DIAGNOSIS — R269 Unspecified abnormalities of gait and mobility: Secondary | ICD-10-CM | POA: Insufficient documentation

## 2014-07-28 DIAGNOSIS — I639 Cerebral infarction, unspecified: Secondary | ICD-10-CM

## 2014-07-28 NOTE — Therapy (Signed)
[facility] [address] [place], [place], [address] Phone: [phone]   Fax:  [phone]  Physical Therapy Treatment  Patient Details  Name: Adam Callahan MRN: [id number] Date of Birth: 06-11-1961 Referring Provider:  Charlett Blake, MD  Encounter Date: 07/28/2014      PT End of Session - 07/28/14 1644    Visit Number 13   Number of Visits 16   Date for PT Re-Evaluation 08/06/14   PT Start Time 1319   PT Stop Time 1402   PT Time Calculation (min) 43 min   Equipment Utilized During Treatment Gait belt   Activity Tolerance Patient tolerated treatment well   Behavior During Therapy [facility] for tasks assessed/performed      Past Medical History  Diagnosis Date  . Diabetes mellitus   . Hypertension     Past Surgical History  Procedure Laterality Date  . Tee without cardioversion N/A 05/03/2014    Procedure: TRANSESOPHAGEAL ECHOCARDIOGRAM (TEE);  Surgeon: Pixie Casino, MD;  Location: [facility] ENDOSCOPY;  Service: Cardiovascular;  Laterality: N/A;    There were no vitals taken for this visit.  Visit Diagnosis:  CVA (cerebral vascular accident)  Abnormality of gait      Subjective Assessment - 07/28/14 1349    Symptoms Denies falls.  Had botox in RUE 2 days ago per pt.   Currently in Pain? No/denies                    [facility] Adult PT Treatment/Exercise - 07/28/14 1638    Transfers   Transfers Sit to Stand;Stand to Sit;Stand Pivot Transfers   Transfers Yes   Sit to Stand 5: Supervision;From chair/3-in-1;With armrests   Sit to Stand Details (indicate cue type and reason) continues to lean to left with decreased RLE weight bearing   Stand to Sit 5: Supervision   Stand Pivot Transfers 4: Min guard   Stand Pivot Transfer Details (indicate cue type and reason) without device with UE's   Ambulation/Gait   Ambulation/Gait Yes   Ambulation/Gait Assistance 5: Supervision   Ambulation/Gait  Assistance Details cues for equal weight bearing and heel strike   Ambulation Distance (Feet) 110 Feet  220 then 110   Assistive device Large base quad cane   Gait Pattern Right foot flat;Lateral trunk lean to left;Poor foot clearance - right   Stairs Assistance Details (indicate cue type and reason) pt declined doing stairs-stated he only has one step at home   High Level Balance   High Level Balance Activities Side stepping;Backward walking;Other (comment)  in parallel bars   High Level Balance Comments standing weight shifting side to side with mirror for visual cues   Lumbar Exercises: Aerobic   Stationary Bike Nustep level 4 bil LE and LUE x 8 minutes   Knee/Hip Exercises: Stretches   Passive Hamstring Stretch 1 rep;20 seconds  seated on right side                  PT Short Term Goals - 07/19/14 1155    PT SHORT TERM GOAL #1   Title independent with HEP (07/05/14)   Status Not Met   PT SHORT TERM GOAL #2   Title improve BERG balance score to >/= 40/56 for improved balance (07/05/14)   Status Not Met  Berg balance score 39/56   PT SHORT TERM GOAL #3   Title improve gait velocity to >0.9 ft/sec with LRAD for improved mobility (07/05/14)   Status  Achieved  gait velocity 0.89 (0.9 ft/sec)   PT SHORT TERM GOAL #4   Title improve timed up and go to < 40 sec with LRAD for improved mobility (07/05/14)   Status Achieved  36.37 sec           PT Long Term Goals - 06/24/14 1145    PT LONG TERM GOAL #1   Title verbalize understanding of CVA risk factors/warning signs (08/02/14)   Status On-going   PT LONG TERM GOAL #2   Title improve BERG balance score to >/= 45/56 for decreased fall risk (08/02/14)   Status On-going   PT LONG TERM GOAL #3   Title negotiate stairs with LRAD with supervision for improved mobility and community access (08/02/14)   Status On-going   PT LONG TERM GOAL #4   Title improve gait velocity to > 1.31 ft/sec for improved functional mobility  (08/02/14)   Status On-going   PT LONG TERM GOAL #5   Title improve timed up and go to < 35 sec with LRAD for improved mobility (08/02/14)   Status On-going               Plan - 07/28/14 1645    Clinical Impression Statement Pt appears frustrated today over lack of RUE mobility.  Continue PT per POC.  Per Mady Haagensen, PT, plan is to d/c next week.   Pt will benefit from skilled therapeutic intervention in order to improve on the following deficits Abnormal gait;Decreased coordination;Difficulty walking;Decreased safety awareness;Decreased endurance;Decreased activity tolerance;Decreased knowledge of precautions;Impaired tone;Decreased knowledge of use of DME;Decreased cognition;Decreased mobility;Decreased strength;Impaired perceived functional ability;Decreased balance   Rehab Potential Good   Clinical Impairments Affecting Rehab Potential decreased memory and awareness of deficits   PT Frequency 2x / week   PT Duration 8 weeks   PT Treatment/Interventions ADLs/Self Care Home Management;Electrical Stimulation;Moist Heat;Cryotherapy;Balance training;DME Instruction;Manual techniques;Therapeutic exercise;Therapeutic activities;Patient/family education;Passive range of motion;Functional mobility training;Ultrasound;Stair training;Cognitive remediation;Gait training;Neuromuscular re-education   Consulted and Agree with Plan of Care Patient        Problem List Patient Active Problem List   Diagnosis Date Noted  . 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 Homonymous hemianopsia following cerebrovascular accident 06/06/2014  . Acute left hemiparesis 05/04/2014  . Stroke 04/30/2014  . Spastic hemiplegia affecting dominant side 10/01/2013  . Aphasia as late effect of cerebrovascular accident 10/01/2013  . 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 Dehydration  06/07/2013  . Hypokalemia 06/07/2013  . Dyslipidemia 06/07/2013  . CVA (cerebral infarction) 06/06/2013  . Diabetes mellitus type 2, uncontrolled 06/06/2013  . Rhabdomyolysis 06/06/2013    [name] 07/28/2014, 4:53 PM  [facility] [address] [place], [place], [address] Phone: [phone]   Fax:  [place] 07/28/2014 4:53 PM Phone: [phone] Fax: [phone]

## 2014-08-02 ENCOUNTER — Encounter: Payer: Self-pay | Admitting: Physical Therapy

## 2014-08-02 ENCOUNTER — Encounter: Payer: Self-pay | Admitting: Occupational Therapy

## 2014-08-02 ENCOUNTER — Ambulatory Visit: Payer: BC Managed Care – PPO | Admitting: Physical Therapy

## 2014-08-02 ENCOUNTER — Ambulatory Visit: Payer: BC Managed Care – PPO | Admitting: Occupational Therapy

## 2014-08-02 DIAGNOSIS — G8110 Spastic hemiplegia affecting unspecified side: Secondary | ICD-10-CM

## 2014-08-02 DIAGNOSIS — M6281 Muscle weakness (generalized): Secondary | ICD-10-CM

## 2014-08-02 DIAGNOSIS — R269 Unspecified abnormalities of gait and mobility: Secondary | ICD-10-CM

## 2014-08-02 DIAGNOSIS — R209 Unspecified disturbances of skin sensation: Secondary | ICD-10-CM

## 2014-08-02 DIAGNOSIS — I69998 Other sequelae following unspecified cerebrovascular disease: Secondary | ICD-10-CM

## 2014-08-02 NOTE — Therapy (Signed)
[facility] [address] [place], [place], [address] Phone: [phone]   Fax:  [phone]  Occupational Therapy Treatment  Patient Details  Name: Adam Callahan MRN: [id number] Date of Birth: January 31, 1961 Referring Provider:  Charlett Blake, MD  Encounter Date: 08/02/2014      OT End of Session - 08/02/14 1247    Visit Number 9   Number of Visits 17   Date for OT Re-Evaluation 08/22/14   Authorization Type [employer], no visit limit   OT Start Time 1151   OT Stop Time 1230   OT Time Calculation (min) 39 min   Activity Tolerance Patient tolerated treatment well      Past Medical History  Diagnosis Date  . Diabetes mellitus   . Hypertension     Past Surgical History  Procedure Laterality Date  . Tee without cardioversion N/A 05/03/2014    Procedure: TRANSESOPHAGEAL ECHOCARDIOGRAM (TEE);  Surgeon: Pixie Casino, MD;  Location: [facility] ENDOSCOPY;  Service: Cardiovascular;  Laterality: N/A;    There were no vitals taken for this visit.  Visit Diagnosis:  Spastic hemiplegia affecting dominant side  Generalized muscle weakness  Alterations of sensations, late effect of cerebrovascular disease      Subjective Assessment - 08/02/14 1201    Symptoms Pt reports that he had Botox last week.  Pt reports that he had house painted and new floors put in last week.   Currently in Pain? No/denies                 OT Treatments/Exercises (OP) - 08/02/14 0001    Splinting   Splinting Fabricated R resting hand splint.                OT Education - 08/02/14 1246    Education Details Splint wear/care   [name]   Methods Explanation;Demonstration;Verbal cues   Comprehension Verbalized understanding;Returned demonstration;Verbal cues required          OT Short Term Goals - 07/21/14 1444    OT SHORT TERM GOAL #1   Title Pt will be Mod I HEP RUE (due 07/24/14)   Status Achieved    OT SHORT TERM GOAL #2   Title Pt will be Mod I precautions realted to impaired sensation RUE (due 07/24/14)   Status Achieved   OT SHORT TERM GOAL #3   Title Pt will be Mod I UB hemi dressing techniques seated. (due 07/24/14)   Status Achieved  met per pt report   OT SHORT TERM GOAL #4   Title Pt will be Mod I gross grasp release w/ RUE in prep for increase dindependence with ADL's/self care tasks. (due 07/24/14)   Status Not Met  min-mod A           OT Long Term Goals - 07/07/14 1530    OT LONG TERM GOAL #1   Title Pt will be Mod I signs and symptoms of CVA (due 08/22/14)   Status On-going   OT LONG TERM GOAL #2   Title Pt will be Mod I upgraded HEP (due 08/22/14)   Status On-going   OT LONG TERM GOAL #3   Title Pt wil lbe Mod I UB/LB dressing sitting and sit to stand using a/e & hemi-dressing techniques PRN. (due 08/22/14)   Status On-going   OT LONG TERM GOAL #4   Title Pt will use RUE as assist during ADL and functional use R dominant UE 75% of the time or  greater for dressing and eating. (due 08/22/14)   Status On-going               Plan - 08/02/14 1249    Clinical Impression Statement Pt verbalized understanding of splint wear/care after splint fabricated, but may need reinforcement.   Plan check splint prn        Problem List Patient Active Problem List   Diagnosis Date Noted  . 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 Homonymous hemianopsia following cerebrovascular accident 06/06/2014  . Acute left hemiparesis 05/04/2014  . Stroke 04/30/2014  . Spastic hemiplegia affecting dominant side 10/01/2013  . Aphasia as late effect of cerebrovascular accident 10/01/2013  . 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 Dehydration 06/07/2013  . Hypokalemia 06/07/2013  . Dyslipidemia 06/07/2013  . CVA (cerebral infarction) 06/06/2013  . Diabetes  mellitus type 2, uncontrolled 06/06/2013  . Rhabdomyolysis 06/06/2013    FREEMAN,ANGELA, OTR/L 08/02/2014, 12:55 PM  [facility] [address] [place], [place], [address] Phone: [phone]   Fax:  [phone]

## 2014-08-02 NOTE — Therapy (Signed)
[facility] [address] [place], [place], [address] Phone: [phone]   Fax:  [phone]  Physical Therapy Treatment  Patient Details  Name: Adam Callahan MRN: [id number] Date of Birth: 11/06/1960 Referring Provider:  Charlett Blake, MD  Encounter Date: 08/02/2014      PT End of Session - 08/02/14 1653    Visit Number 14   Number of Visits 16   Date for PT Re-Evaluation 08/06/14   PT Start Time 1105   PT Stop Time 1147   PT Time Calculation (min) 42 min   Equipment Utilized During Treatment Gait belt   Activity Tolerance Patient tolerated treatment well   Behavior During Therapy [facility] for tasks assessed/performed      Past Medical History  Diagnosis Date  . Diabetes mellitus   . Hypertension     Past Surgical History  Procedure Laterality Date  . Tee without cardioversion N/A 05/03/2014    Procedure: TRANSESOPHAGEAL ECHOCARDIOGRAM (TEE);  Surgeon: Pixie Casino, MD;  Location: [facility] ENDOSCOPY;  Service: Cardiovascular;  Laterality: N/A;    There were no vitals taken for this visit.  Visit Diagnosis:  Generalized muscle weakness  Abnormality of gait      Subjective Assessment - 08/02/14 1117    Symptoms Denies falls or changes.  Reports RUE is "relaxing" after botox.   Currently in Pain? No/denies                    [facility] Adult PT Treatment/Exercise - 08/02/14 1120    Transfers   Transfers Sit to Stand;Stand to [employer] Transfers   Transfers Yes   Sit to Stand 5: Supervision;From chair/3-in-1;With armrests   Sit to Stand Details (indicate cue type and reason) leans left despite constant cues   Stand to Sit 5: Supervision   Stand Pivot Transfers 5: Supervision   Stand Pivot Transfer Details (indicate cue type and reason) without device   Ambulation/Gait   Ambulation/Gait Yes   Ambulation/Gait Assistance 5: Supervision   Ambulation/Gait Assistance Details cues for  decreased lean/upright trunk and heel strike on L   Ambulation Distance (Feet) 110 Feet  twice   Assistive device Small based quad cane   Gait Pattern Decreased dorsiflexion - left;Decreased stance time - left;Right hip hike;Lateral trunk lean to left   Gait velocity .99 ft/sec   Berg Balance Test   Sit to Stand Able to stand without using hands and stabilize independently   Standing Unsupported Able to stand safely 2 minutes   Sitting with Back Unsupported but Feet Supported on Floor or Stool Able to sit safely and securely 2 minutes   Stand to Sit Sits safely with minimal use of hands   Transfers Able to transfer safely, minor use of hands   Standing Unsupported with Eyes Closed Able to stand 10 seconds safely   Standing Ubsupported with Feet Together Able to place feet together independently and stand for 1 minute with supervision   From Standing, Reach Forward with Outstretched Arm Can reach confidently >25 cm (10")   From Standing Position, Pick up Object from Floor Able to pick up shoe, needs supervision   From Standing Position, Turn to Look Behind Over each Shoulder Looks behind one side only/other side shows less weight shift   Turn 360 Degrees Needs close supervision or verbal cueing   Standing Unsupported, Alternately Place Feet on Step/Stool Needs assistance to keep from falling or unable to try   Standing Unsupported, One  Foot in [place] to plae foot ahead of the other independently and hold 30 seconds   Standing on One Leg Tries to lift leg/unable to hold 3 seconds but remains standing independently   Total Score 42   Timed Up and Go Test   TUG Normal TUG   Normal TUG (seconds) 32.7   Transfers   Sit to Stand Details Manual facilitation for weight shifting;Other (comment)  repeated x 10 on yellow bubble L then 4"step on L                 PT Education - 08/02/14 1652    Education provided Yes   Education Details CVA risk factors/warning signs   Person(s)  Educated Patient   Methods Explanation   Comprehension Verbalized understanding          PT Short Term Goals - 07/19/14 1155    PT SHORT TERM GOAL #1   Title independent with HEP (07/05/14)   Status Not Met   PT SHORT TERM GOAL #2   Title improve BERG balance score to >/= 40/56 for improved balance (07/05/14)   Status Not Met  Berg balance score 39/56   PT SHORT TERM GOAL #3   Title improve gait velocity to >0.9 ft/sec with LRAD for improved mobility (07/05/14)   Status Achieved  gait velocity 0.89 (0.9 ft/sec)   PT SHORT TERM GOAL #4   Title improve timed up and go to < 40 sec with LRAD for improved mobility (07/05/14)   Status Achieved  36.37 sec           PT Long Term Goals - 08/02/14 1655    PT LONG TERM GOAL #1   Title verbalize understanding of CVA risk factors/warning signs (08/02/14)   Status Achieved   PT LONG TERM GOAL #2   Title improve BERG balance score to >/= 45/56 for decreased fall risk (08/02/14)   Status Not Met   PT LONG TERM GOAL #3   Title negotiate stairs with LRAD with supervision for improved mobility and community access (08/02/14)   Status Unable to assess  Pt refused stating he did not have to do any steps.   PT LONG TERM GOAL #4   Title improve gait velocity to > 1.31 ft/sec for improved functional mobility (08/02/14)   Baseline .99 ft/sec on 08/02/14   Status Not Met   PT LONG TERM GOAL #5   Title improve timed up and go to < 35 sec with LRAD for improved mobility (08/02/14)   Status Achieved               Plan - 08/02/14 1653    Clinical Impression Statement Pt continues to appear frustrated during treatment.  Pt aware of d/c at next visit.   Pt will benefit from skilled therapeutic intervention in order to improve on the following deficits Abnormal gait;Decreased coordination;Difficulty walking;Decreased safety awareness;Decreased endurance;Decreased activity tolerance;Decreased knowledge of precautions;Impaired tone;Decreased  knowledge of use of DME;Decreased cognition;Decreased mobility;Decreased strength;Impaired perceived functional ability;Decreased balance   Rehab Potential Good   Clinical Impairments Affecting Rehab Potential decreased memory and awareness of deficits   PT Frequency 2x / week   PT Duration 8 weeks   PT Treatment/Interventions ADLs/Self Care Home Management;Electrical Stimulation;Moist Heat;Cryotherapy;Balance training;DME Instruction;Manual techniques;Therapeutic exercise;Therapeutic activities;Patient/family education;Passive range of motion;Functional mobility training;Ultrasound;Stair training;Cognitive remediation;Gait training;Neuromuscular re-education   PT Next Visit Plan Review HEP.  Complete FOTO.   Consulted and Agree with Plan of Care Patient  Problem List Patient Active Problem List   Diagnosis Date Noted  . 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 Homonymous hemianopsia following cerebrovascular accident 06/06/2014  . Acute left hemiparesis 05/04/2014  . Stroke 04/30/2014  . Spastic hemiplegia affecting dominant side 10/01/2013  . Aphasia as late effect of cerebrovascular accident 10/01/2013  . 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 Dehydration 06/07/2013  . Hypokalemia 06/07/2013  . Dyslipidemia 06/07/2013  . CVA (cerebral infarction) 06/06/2013  . Diabetes mellitus type 2, uncontrolled 06/06/2013  . Rhabdomyolysis 06/06/2013    [name] 08/02/2014, 4:58 PM  [facility] [address] [place], [place], [address] Phone: [phone]   Fax:  [facility], [place] 08/02/2014 4:59 PM Phone: [phone] Fax: [phone]

## 2014-08-02 NOTE — Patient Instructions (Signed)
Your Splint This splint should initially be fitted by a healthcare practitioner.  The healthcare practitioner is responsible for providing wearing instructions and precautions to the patient, other healthcare practitioners and care provider involved in the patient's care.  This splint was custom made for you. Please read the following instructions to learn about wearing and caring for your splint.  Precautions Should your splint cause any of the following problems, remove the splint immediately and contact your therapist/physician.  Swelling  Severe Pain  Pressure Areas  Stiffness  Numbness  Do not wear your splint while operating machinery unless it has been fabricated for that purpose.  When To Wear Your Splint Where your splint according to your therapist/physician instructions. Wear for 3-4 hrs day the first 2 days.  If no problems, increase wearing time to 6hrs during the day.  If no problems wearing for 6 hrs, begin wearing at night.  Care and Cleaning of Your Splint 1. Keep your splint away from open flames. 2. Your splint will lose its shape in temperatures over 135 degrees Farenheit, ( in car windows, near radiators, ovens or in hot water).  Never make any adjustments to your splint, if the splint needs adjusting remove it and make an appointment to see your therapist. 3. Your splint, including the cushion liner may be cleaned with soap and lukewarm water.  Do not immerse in hot water over 135 degrees Farenheit. 4. Straps may be washed with soap and water, but do not moisten the self-adhesive portion. 5. For ink or hard to remove spots use a scouring cleanser which contains chlorine.  Rinse the splint thoroughly after using chlorine cleanser.

## 2014-08-04 ENCOUNTER — Ambulatory Visit: Payer: BC Managed Care – PPO | Admitting: Physical Therapy

## 2014-08-04 ENCOUNTER — Ambulatory Visit: Payer: BC Managed Care – PPO | Admitting: Occupational Therapy

## 2014-08-04 ENCOUNTER — Encounter: Payer: Self-pay | Admitting: Occupational Therapy

## 2014-08-04 DIAGNOSIS — I69998 Other sequelae following unspecified cerebrovascular disease: Secondary | ICD-10-CM

## 2014-08-04 DIAGNOSIS — G8111 Spastic hemiplegia affecting right dominant side: Secondary | ICD-10-CM

## 2014-08-04 DIAGNOSIS — R209 Unspecified disturbances of skin sensation: Secondary | ICD-10-CM

## 2014-08-04 DIAGNOSIS — G8110 Spastic hemiplegia affecting unspecified side: Secondary | ICD-10-CM

## 2014-08-04 NOTE — Therapy (Signed)
[facility] [address] [place], [place], [address] Phone: [phone]   Fax:  [phone]  Occupational Therapy Treatment  Patient Details  Name: Adam Callahan MRN: [id number] Date of Birth: 11/28/1960 Referring Provider:  Charlett Blake, MD  Encounter Date: 08/04/2014      OT End of Session - 08/04/14 1611    Visit Number 10   Number of Visits 17   Date for OT Re-Evaluation 08/22/14   Authorization Type BCBS, no visit limit   OT Start Time 1535   OT Stop Time 1615   OT Time Calculation (min) 40 min   Activity Tolerance Patient tolerated treatment well      Past Medical History  Diagnosis Date  . Diabetes mellitus   . Hypertension     Past Surgical History  Procedure Laterality Date  . Tee without cardioversion N/A 05/03/2014    Procedure: TRANSESOPHAGEAL ECHOCARDIOGRAM (TEE);  Surgeon: Pixie Casino, MD;  Location: [facility] ENDOSCOPY;  Service: Cardiovascular;  Laterality: N/A;    There were no vitals taken for this visit.  Visit Diagnosis:  Spastic hemiplegia affecting dominant side  Alterations of sensations, late effect of cerebrovascular disease  Right spastic hemiparesis      Subjective Assessment - 08/04/14 1541    Symptoms "My splint is fine"   Currently in Pain? No/denies                 OT Treatments/Exercises (OP) - 08/04/14 1606    Neurological Re-education Exercises   Other Exercises 1 Supine: AA/ROM w/ PVC frame in chest press motion, followed by sh. flex/ext w/ cane (straight arm) w/ min to mod facilitation provided by therapist for RUE to maintain elbow extension and proper positioning. Seated: AA/ROM w/ mod to max assist from therapist.    Acupuncturist Location dorsal wrist/forearm x10 min   Electrical Stimulation Action wrist and finger extension   Electrical Stimulation Parameters 50 pps, 248 pw, 10 on/10 off cycle   Electrical Stimulation Goals Neuromuscular facilitation   Manual Therapy   Manual Therapy Passive ROM   Passive ROM in shoulder flexion, elbow extension, and finger flexion and extension                  OT Short Term Goals - 07/21/14 1444    OT SHORT TERM GOAL #1   Title Pt will be Mod I HEP RUE (due 07/24/14)   Status Achieved   OT SHORT TERM GOAL #2   Title Pt will be Mod I precautions realted to impaired sensation RUE (due 07/24/14)   Status Achieved   OT SHORT TERM GOAL #3   Title Pt will be Mod I UB hemi dressing techniques seated. (due 07/24/14)   Status Achieved  met per pt report   OT SHORT TERM GOAL #4   Title Pt will be Mod I gross grasp release w/ RUE in prep for increase dindependence with ADL's/self care tasks. (due 07/24/14)   Status Not Met  min-mod A           OT Long Term Goals - 07/07/14 1530    OT LONG TERM GOAL #1   Title Pt will be Mod I signs and symptoms of CVA (due 08/22/14)   Status On-going   OT LONG TERM GOAL #2   Title Pt will be Mod I upgraded HEP (due 08/22/14)   Status On-going   OT LONG TERM GOAL #3   Title Pt  wil lbe Mod I UB/LB dressing sitting and sit to stand using a/e & hemi-dressing techniques PRN. (due 08/22/14)   Status On-going   OT LONG TERM GOAL #4   Title Pt will use RUE as assist during ADL and functional use R dominant UE 75% of the time or greater for dressing and eating. (due 08/22/14)   Status On-going               Plan - 08/04/14 1612    Clinical Impression Statement Pt reports splint is doing fine. Pt reports not doing ex's recently   Plan continue neuro re-education, estim   Consulted and Agree with Plan of Care Patient        Problem List Patient Active Problem List   Diagnosis Date Noted  . 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 Homonymous hemianopsia following cerebrovascular accident 06/06/2014  . Acute left hemiparesis 05/04/2014  . Stroke 04/30/2014  .  Spastic hemiplegia affecting dominant side 10/01/2013  . Aphasia as late effect of cerebrovascular accident 10/01/2013  . 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 Dehydration 06/07/2013  . Hypokalemia 06/07/2013  . Dyslipidemia 06/07/2013  . CVA (cerebral infarction) 06/06/2013  . Diabetes mellitus type 2, uncontrolled 06/06/2013  . Rhabdomyolysis 06/06/2013    Carey Bullocks, OTR/L 08/04/2014, 4:22 PM  [facility] [address] [place] [address] [place], [place], [address] Phone: [phone]   Fax:  [phone]

## 2014-08-10 ENCOUNTER — Ambulatory Visit: Payer: BC Managed Care – PPO | Admitting: Physical Therapy

## 2014-08-10 ENCOUNTER — Ambulatory Visit: Payer: BC Managed Care – PPO | Admitting: Occupational Therapy

## 2014-08-10 ENCOUNTER — Encounter: Payer: Self-pay | Admitting: Occupational Therapy

## 2014-08-10 DIAGNOSIS — M6281 Muscle weakness (generalized): Secondary | ICD-10-CM

## 2014-08-10 DIAGNOSIS — R269 Unspecified abnormalities of gait and mobility: Secondary | ICD-10-CM

## 2014-08-10 DIAGNOSIS — G8110 Spastic hemiplegia affecting unspecified side: Secondary | ICD-10-CM

## 2014-08-10 NOTE — Therapy (Signed)
[facility] [address] [place], [place], [address] Phone: [phone]   Fax:  [phone]  Occupational Therapy Treatment  Patient Details  Name: Adam Callahan MRN: [id number] Date of Birth: 08-Oct-1960 Referring Provider:  Charlett Blake, MD  Encounter Date: 08/10/2014      OT End of Session - 08/10/14 1139    Visit Number 11   Number of Visits 17   Date for OT Re-Evaluation 08/22/14   Authorization Type BCBS, no visit limit   OT Start Time 1108   Activity Tolerance Patient tolerated treatment well      Past Medical History  Diagnosis Date  . Diabetes mellitus   . Hypertension     Past Surgical History  Procedure Laterality Date  . Tee without cardioversion N/A 05/03/2014    Procedure: TRANSESOPHAGEAL ECHOCARDIOGRAM (TEE);  Surgeon: Pixie Casino, MD;  Location: [facility] ENDOSCOPY;  Service: Cardiovascular;  Laterality: N/A;    There were no vitals taken for this visit.  Visit Diagnosis:  Spastic hemiplegia affecting dominant side      Subjective Assessment - 08/10/14 1135    Symptoms Pt reports no problems with splint   Currently in Pain? No/denies                 OT Treatments/Exercises (OP) - 08/10/14 0001    Neurological Re-education Exercises   Shoulder Flexion AROM  low range with min cues for compensation   Other Exercises 1 In supine:  AAROM chest press with PVC frame with mod facilitation, AAROM shoulder flex with cane with min-mod facilitation at elbow and for control.  In sitting, AAROM shoulder flex/elbow extension with min facillitation/cues.   Seated with weight on hand --  with body on arm movements with mod facilitation/cues   Electrical Stimulation   Electrical Stimulation Location dorsal wrist/foream x61mn   Electrical Stimulation Action wrist/finger ext   Electrical Stimulation Parameters 50pps, 250 pulse width, 10sec cycle, intensity=33, 2sec ramp with no  adverse reactions   Electrical Stimulation Goals Neuromuscular facilitation                  OT Short Term Goals - 07/21/14 1444    OT SHORT TERM GOAL #1   Title Pt will be Mod I HEP RUE (due 07/24/14)   Status Achieved   OT SHORT TERM GOAL #2   Title Pt will be Mod I precautions realted to impaired sensation RUE (due 07/24/14)   Status Achieved   OT SHORT TERM GOAL #3   Title Pt will be Mod I UB hemi dressing techniques seated. (due 07/24/14)   Status Achieved  met per pt report   OT SHORT TERM GOAL #4   Title Pt will be Mod I gross grasp release w/ RUE in prep for increase dindependence with ADL's/self care tasks. (due 07/24/14)   Status Not Met  min-mod A           OT Long Term Goals - 07/07/14 1530    OT LONG TERM GOAL #1   Title Pt will be Mod I signs and symptoms of CVA (due 08/22/14)   Status On-going   OT LONG TERM GOAL #2   Title Pt will be Mod I upgraded HEP (due 08/22/14)   Status On-going   OT LONG TERM GOAL #3   Title Pt wil lbe Mod I UB/LB dressing sitting and sit to stand using a/e & hemi-dressing techniques PRN. (due 08/22/14)   Status On-going  OT LONG TERM GOAL #4   Title Pt will use RUE as assist during ADL and functional use R dominant UE 75% of the time or greater for dressing and eating. (due 08/22/14)   Status On-going               Plan - 08/10/14 1140    Clinical Impression Statement Pt needs min-mod facilitation for AAROM and wt. bearing with cues for attention to taks.   Plan neuro re-ed, estim        Problem List Patient Active Problem List   Diagnosis Date Noted  . 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 Homonymous hemianopsia following cerebrovascular accident 06/06/2014  . Acute left hemiparesis 05/04/2014  . Stroke 04/30/2014  . Spastic hemiplegia affecting dominant side 10/01/2013  . Aphasia as late effect of cerebrovascular accident 10/01/2013  . Alterations of sensations, late  effect of cerebrovascular disease(438.6) 10/01/2013  . 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 Dehydration 06/07/2013  . Hypokalemia 06/07/2013  . Dyslipidemia 06/07/2013  . CVA (cerebral infarction) 06/06/2013  . Diabetes mellitus type 2, uncontrolled 06/06/2013  . Rhabdomyolysis 06/06/2013    Arney Mayabb, OTR/L 08/10/2014, 11:57 AM  [facility] [address] [place], [place], [address] Phone: [phone]   Fax:  [phone]

## 2014-08-10 NOTE — Therapy (Signed)
[facility] [address] [place], [place], [address] Phone: [phone]   Fax:  [phone]  Physical Therapy Treatment  Patient Details  Name: Adam Callahan MRN: [id number] Date of Birth: 06-25-1961 Referring Provider:  Charlett Blake, MD  Encounter Date: 08/10/2014      PT End of Session - 08/11/14 1457    Visit Number 15   Number of Visits 16   PT Start Time 0174   PT Stop Time 1233   PT Time Calculation (min) 39 min   Equipment Utilized During Treatment Gait belt   Activity Tolerance Patient tolerated treatment well      Past Medical History  Diagnosis Date  . Diabetes mellitus   . Hypertension     Past Surgical History  Procedure Laterality Date  . Tee without cardioversion N/A 05/03/2014    Procedure: TRANSESOPHAGEAL ECHOCARDIOGRAM (TEE);  Surgeon: Pixie Casino, MD;  Location: [facility] ENDOSCOPY;  Service: Cardiovascular;  Laterality: N/A;    There were no vitals taken for this visit.  Visit Diagnosis:  Generalized muscle weakness - Plan: PT plan of care cert/re-cert  Abnormality of gait - Plan: PT plan of care cert/re-cert      Subjective Assessment - 08/10/14 1156    Symptoms Denies any falls; reports doing his exercises at home.   Currently in Pain? No/denies         Gait velocity:  46.58 seconds with quad cane ( in 10 meter walk) (0.74 ft/sec) Second trial of gait velocity with quad cane 42.88 seconds.  Pt feels that he wants to walk without assistive device, and reports he walks short distances without assistive device.  Gait training without assistive device x 80 ft, with one episode of loss of balance.  Gait training with small base quad cane 120 ft. X 2 reps with cues for weightshifting and equal weightbearing onto RLE, with increased LLE step length for step through gait.   Had conversation about optimal safety with gait using cane, still try to think about weightshifting equally  and step through pattern for gait when using cane.Transfers wheelchair<>mat with standing pivot transfer modified independently.  TherEx:  Pt performs sit<>stand transfers from mat surfaces x 5 reps , then from wheelchair, x  10 reps with UE support with cues for foot placement.  Pt performs bridging and quad sets as part of HEP.                      PT Education - 08/11/14 1456    Education provided Yes   Education Details reviewed HEP; discussed plan of care and plans for discharge this visit   [name]   Methods Explanation   Comprehension Verbalized understanding          PT Short Term Goals - 07/19/14 1155    PT SHORT TERM GOAL #1   Title independent with HEP (07/05/14)   Status Not Met   PT SHORT TERM GOAL #2   Title improve BERG balance score to >/= 40/56 for improved balance (07/05/14)   Status Not Met  Berg balance score 39/56   PT SHORT TERM GOAL #3   Title improve gait velocity to >0.9 ft/sec with LRAD for improved mobility (07/05/14)   Status Achieved  gait velocity 0.89 (0.9 ft/sec)   PT SHORT TERM GOAL #4   Title improve timed up and go to < 40 sec with LRAD for improved mobility (07/05/14)   Status Achieved  36.37 sec           PT Long Term Goals - 08/02/14 1655    PT LONG TERM GOAL #1   Title verbalize understanding of CVA risk factors/warning signs (08/02/14)   Status Achieved   PT LONG TERM GOAL #2   Title improve BERG balance score to >/= 45/56 for decreased fall risk (08/02/14)   Status Not Met   PT LONG TERM GOAL #3   Title negotiate stairs with LRAD with supervision for improved mobility and community access (08/02/14)   Status Unable to assess  Pt refused stating he did not have to do any steps.   PT LONG TERM GOAL #4   Title improve gait velocity to > 1.31 ft/sec for improved functional mobility (08/02/14)   Baseline .99 ft/sec on 08/02/14   Status Not Met   PT LONG TERM GOAL #5   Title improve timed up and go  to < 35 sec with LRAD for improved mobility (08/02/14)   Status Achieved               Plan - 08/11/14 1457    Clinical Impression Statement Pt has met LTG #1 and #5.  Remaining LTGs not met.  Pt continues to remain at fall risk and continues to have decreased strength, weight shifting, functional use on RLE.  Pt appears to have decreased carryover from PT sessions to home activities.  Attempted to have pt fill out FOTO at end of session-pt refuses.   PT Next Visit Plan D/C this visit        Problem List Patient Active Problem List   Diagnosis Date Noted  . 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 Homonymous hemianopsia following cerebrovascular accident 06/06/2014  . Acute left hemiparesis 05/04/2014  . Stroke 04/30/2014  . Spastic hemiplegia affecting dominant side 10/01/2013  . Aphasia as late effect of cerebrovascular accident 10/01/2013  . 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 Dehydration 06/07/2013  . Hypokalemia 06/07/2013  . Dyslipidemia 06/07/2013  . CVA (cerebral infarction) 06/06/2013  . Diabetes mellitus type 2, uncontrolled 06/06/2013  . Rhabdomyolysis 06/06/2013   PHYSICAL THERAPY DISCHARGE SUMMARY  Visits from Start of Care: 14  Current functional level related to goals / functional outcomes: See above goals   Remaining deficits:Decreased strength, decreased balance, decreased gait   Education / Equipment: Pt has been educated in CVA education, HEP  Plan: Patient agrees to discharge.  Patient goals were not met. Patient is being discharged due to lack of progress.  ?????      Tacara Hadlock W. 08/11/2014, 3:04 PM Mady Haagensen, PT 08/11/2014 3:04 PM Phone: [phone] Fax: [facility] [address] [place], [place], [address] Phone:  [phone]   Fax:  [phone]

## 2014-08-11 ENCOUNTER — Encounter: Payer: Self-pay | Admitting: Occupational Therapy

## 2014-08-11 ENCOUNTER — Ambulatory Visit: Payer: BC Managed Care – PPO

## 2014-08-11 ENCOUNTER — Ambulatory Visit: Payer: BC Managed Care – PPO | Admitting: Occupational Therapy

## 2014-08-11 DIAGNOSIS — G8111 Spastic hemiplegia affecting right dominant side: Secondary | ICD-10-CM

## 2014-08-11 DIAGNOSIS — I69998 Other sequelae following unspecified cerebrovascular disease: Secondary | ICD-10-CM

## 2014-08-11 DIAGNOSIS — R209 Unspecified disturbances of skin sensation: Secondary | ICD-10-CM

## 2014-08-11 DIAGNOSIS — G8110 Spastic hemiplegia affecting unspecified side: Secondary | ICD-10-CM

## 2014-08-11 NOTE — Therapy (Signed)
[facility] [address] [place], [place], [address] Phone: [phone]   Fax:  [phone]  Occupational Therapy Treatment  Patient Details  Name: Adam Callahan MRN: [id number] Date of Birth: [birth year] Referring Provider:  Charlett Blake, MD  Encounter Date: 08/11/2014      OT End of Session - 08/11/14 1547    Visit Number 12   Number of Visits 17   Date for OT Re-Evaluation 08/22/14   Authorization Type BCBS, no visit limit   OT Start Time 1445   OT Stop Time 1530   OT Time Calculation (min) 45 min   Activity Tolerance Patient tolerated treatment well      Past Medical History  Diagnosis Date  . Diabetes mellitus   . Hypertension     Past Surgical History  Procedure Laterality Date  . Tee without cardioversion N/A 05/03/2014    Procedure: TRANSESOPHAGEAL ECHOCARDIOGRAM (TEE);  Surgeon: Pixie Casino, MD;  Location: [facility] ENDOSCOPY;  Service: Cardiovascular;  Laterality: N/A;    There were no vitals taken for this visit.  Visit Diagnosis:  Spastic hemiplegia affecting dominant side  Alterations of sensations, late effect of cerebrovascular disease  Right spastic hemiparesis      Subjective Assessment - 08/11/14 1453    Symptoms No changes - my arm ain't working   Patient Stated Goals "I want to get well, I want to use that right hand b/c I draw, I want that."    Currently in Pain? No/denies                 OT Treatments/Exercises (OP) - 08/11/14 0001    Neurological Re-education Exercises   Other Exercises 1 AA/ROM w/ UE ranger to work on low range sh. flexion w/ elbow extension to horizontal abduction and sh. extension. Pt able to control at low to midrange level.    Other Weight-Bearing Exercises 1 seated: wt bearing on Rt elbow while performing ipsilateral and contralateral reaching LUE.    Other Weight-Bearing Exercises 2 SEATED: wt bearing over RUE (straight arm) w/ trunk  rotation and sit to squat activities w/ min assist to support Rt hand   Acupuncturist Stimulation Location dorsal wrist/foream x26mn   Electrical Stimulation Action wrist/finger extension   Electrical Stimulation Parameters 50 pps, 248 pw, 10 on/10 off cycle   Electrical Stimulation Goals Neuromuscular facilitation                  OT Short Term Goals - 07/21/14 1444    OT SHORT TERM GOAL #1   Title Pt will be Mod I HEP RUE (due 07/24/14)   Status Achieved   OT SHORT TERM GOAL #2   Title Pt will be Mod I precautions realted to impaired sensation RUE (due 07/24/14)   Status Achieved   OT SHORT TERM GOAL #3   Title Pt will be Mod I UB hemi dressing techniques seated. (due 07/24/14)   Status Achieved  met per pt report   OT SHORT TERM GOAL #4   Title Pt will be Mod I gross grasp release w/ RUE in prep for increase dindependence with ADL's/self care tasks. (due 07/24/14)   Status Not Met  min-mod A           OT Long Term Goals - 08/11/14 1551    OT LONG TERM GOAL #1   Title Pt will be Mod I signs and symptoms of CVA (due 08/22/14)   Status  Achieved   OT LONG TERM GOAL #2   Title Pt will be Mod I upgraded HEP (due 08/22/14)   Status Achieved   OT LONG TERM GOAL #3   Title Pt wil lbe Mod I UB/LB dressing sitting and sit to stand using a/e & hemi-dressing techniques PRN. (due 08/22/14)   Status Achieved   OT LONG TERM GOAL #4   Title Pt will use RUE as assist during ADL and functional use R dominant UE 75% of the time or greater for dressing and eating. (due 08/22/14)   Status On-going               Plan - 08/11/14 1548    Clinical Impression Statement Pt with improved control in AA/ROM low to midrange level (closed chain). Pt met LTG's #1-3   Plan anticipate d/c next week. Finish checking LTG #4        Problem List Patient Active Problem List   Diagnosis Date Noted  . 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 Homonymous hemianopsia following cerebrovascular accident 06/06/2014  . Acute left hemiparesis 05/04/2014  . Stroke 04/30/2014  . Spastic hemiplegia affecting dominant side 10/01/2013  . Aphasia as late effect of cerebrovascular accident 10/01/2013  . 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 Dehydration 06/07/2013  . Hypokalemia 06/07/2013  . Dyslipidemia 06/07/2013  . CVA (cerebral infarction) 06/06/2013  . Diabetes mellitus type 2, uncontrolled 06/06/2013  . Rhabdomyolysis 06/06/2013    Carey Bullocks, OTR/L 08/11/2014, 3:52 PM  [facility] [address] [place], [place], [address] Phone: [phone]   Fax:  [phone]

## 2014-08-15 ENCOUNTER — Ambulatory Visit: Payer: BC Managed Care – PPO | Admitting: Physical Therapy

## 2014-08-15 ENCOUNTER — Ambulatory Visit: Payer: BC Managed Care – PPO | Admitting: Occupational Therapy

## 2014-08-17 ENCOUNTER — Ambulatory Visit: Payer: Self-pay | Admitting: Physical Therapy

## 2014-08-17 ENCOUNTER — Ambulatory Visit: Payer: BC Managed Care – PPO | Admitting: Occupational Therapy

## 2014-08-17 ENCOUNTER — Encounter: Payer: Self-pay | Admitting: Occupational Therapy

## 2014-08-17 DIAGNOSIS — R209 Unspecified disturbances of skin sensation: Secondary | ICD-10-CM

## 2014-08-17 DIAGNOSIS — I69998 Other sequelae following unspecified cerebrovascular disease: Secondary | ICD-10-CM

## 2014-08-17 DIAGNOSIS — M6281 Muscle weakness (generalized): Secondary | ICD-10-CM

## 2014-08-17 DIAGNOSIS — G8110 Spastic hemiplegia affecting unspecified side: Secondary | ICD-10-CM

## 2014-08-17 NOTE — Patient Instructions (Signed)
Lateral Weight Shift: Upper Trunk Leading   Sit with feet flat on floor. Bring _Rt___ shoulder, head and arm toward side until forearm/elbow just touches sitting surface. Hold __10__ seconds. Return to upright position by pushing through arm Repeat _10___ times per session. Do __2__ sessions per day.  Can do with straight arm and then bent arm on elbow!     SITTING: Forward Weight Shift   Sit upright, place both hands on sitting surface. Lean chest forward, keep back straight. Hold _5__ seconds. _10__ reps per set, _2__ sets per day.  Place wedge under hand if wrist range of motion is limited.  Marland Kitchen

## 2014-08-17 NOTE — Therapy (Signed)
[facility] [address] Adam Callahan [place], [place], [address] Phone: [phone]   Fax:  [phone]  Occupational Therapy Treatment  Patient Details  Name: Adam Callahan MRN: [id number] Date of Birth: December 21, 1960 Referring Provider:  Charlett Blake, MD  Encounter Date: 08/17/2014      OT End of Session - 08/17/14 1257    Visit Number 13   Number of Visits 17   Date for OT Re-Evaluation 08/22/14   Authorization Type BCBS, no visit limit   OT Start Time 3903   OT Stop Time 1315   OT Time Calculation (min) 40 min   Activity Tolerance Patient tolerated treatment well      Past Medical History  Diagnosis Date  . Diabetes mellitus   . Hypertension     Past Surgical History  Procedure Laterality Date  . Tee without cardioversion N/A 05/03/2014    Procedure: TRANSESOPHAGEAL ECHOCARDIOGRAM (TEE);  Surgeon: Adam Casino, MD;  Location: [facility] ENDOSCOPY;  Service: Cardiovascular;  Laterality: N/A;    There were no vitals taken for this visit.  Visit Diagnosis:  Spastic hemiplegia affecting dominant side  Alterations of sensations, late effect of cerebrovascular disease  Generalized muscle weakness      Subjective Assessment - 08/17/14 1241    Symptoms I wish I could open my hand   Currently in Pain? No/denies                 OT Treatments/Exercises (OP) - 08/17/14 1253    Exercises   Exercises Shoulder  UBE x 10 min. Level 1 for reciprocal mvmt, (hand wrapped)   Neurological Re-education Exercises   Other Information Reviewed wt bearing HEP (seated) for RUE and issued handout today.    Acupuncturist Location dorsal wrist/foream x10 min   Electrical Stimulation Action wrist/finger extension   Electrical Stimulation Parameters 50 pps, 248 pw, 10 on/10 off cycle   Electrical Stimulation Goals Neuromuscular facilitation                OT Education -  08/17/14 1245    Education provided Yes   Education Details weight bearning HEP for RUE   [name]   Methods Explanation;Demonstration;Handout   Comprehension Verbalized understanding          OT Short Term Goals - 07/21/14 1444    OT SHORT TERM GOAL #1   Title Pt will be Mod I HEP RUE (due 07/24/14)   Status Achieved   OT SHORT TERM GOAL #2   Title Pt will be Mod I precautions realted to impaired sensation RUE (due 07/24/14)   Status Achieved   OT SHORT TERM GOAL #3   Title Pt will be Mod I UB hemi dressing techniques seated. (due 07/24/14)   Status Achieved  met per pt report   OT SHORT TERM GOAL #4   Title Pt will be Mod I gross grasp release w/ RUE in prep for increase dindependence with ADL's/self care tasks. (due 07/24/14)   Status Not Met  min-mod A           OT Long Term Goals - 08/17/14 1248    OT LONG TERM GOAL #1   Title Pt will be Mod I signs and symptoms of CVA (due 08/22/14)   Status Achieved   OT LONG TERM GOAL #2   Title Pt will be Mod I upgraded HEP (due 08/22/14)   Status Achieved   OT LONG TERM GOAL #  3   Title Pt wil lbe Mod I UB/LB dressing sitting and sit to stand using a/e & hemi-dressing techniques PRN. (due 08/22/14)   Status Achieved   OT LONG TERM GOAL #4   Title Pt will use RUE as assist during ADL and functional use R dominant UE 75% of the time or greater for dressing and eating. (due 08/22/14)   Status Not Met  Unable to use RUE functionally. Can only use upper arm to stabalize objects               Plan - 08/17/14 1257    Clinical Impression Statement Pt met LTG's #1-3. LTG #4 not met.    Plan D/C AdamDonnajean Callahan and Agree with Plan of Care Patient        Problem List Patient Active Problem List   Diagnosis Date Noted  . 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 Homonymous hemianopsia following cerebrovascular accident 06/06/2014  . Acute left hemiparesis 05/04/2014  .  Stroke 04/30/2014  . Spastic hemiplegia affecting dominant side 10/01/2013  . Aphasia as late effect of cerebrovascular accident 10/01/2013  . 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 Dehydration 06/07/2013  . Hypokalemia 06/07/2013  . Dyslipidemia 06/07/2013  . CVA (cerebral infarction) 06/06/2013  . Diabetes mellitus type 2, uncontrolled 06/06/2013  . Rhabdomyolysis 06/06/2013        OCCUPATIONAL THERAPY DISCHARGE SUMMARY  Visits from Start of Care: 13  Current functional level related to goals / functional outcomes: SEE ABOVE   Remaining deficits: Spasticity RUE Decreased ROM and function RUE   Education / Equipment: CVA education, HEP's  Plan: Patient agrees to discharge.  Patient goals were partially met. Patient is being discharged due to                                                     Reaching maximal rehab potential ?????                                                                       Adam Callahan, OTR/L 08/17/2014, 1:00 PM  [facility] [address] [place], [place], [address] Phone: [phone]   Fax:  [phone]

## 2014-09-06 ENCOUNTER — Encounter: Payer: BC Managed Care – PPO | Attending: Physical Medicine & Rehabilitation

## 2014-09-06 ENCOUNTER — Ambulatory Visit (HOSPITAL_BASED_OUTPATIENT_CLINIC_OR_DEPARTMENT_OTHER): Payer: Worker's Compensation | Admitting: Physical Medicine & Rehabilitation

## 2014-09-06 ENCOUNTER — Encounter: Payer: Self-pay | Admitting: Physical Medicine & Rehabilitation

## 2014-09-06 VITALS — BP 166/97 | HR 95 | Resp 14

## 2014-09-06 DIAGNOSIS — G8110 Spastic hemiplegia affecting unspecified side: Secondary | ICD-10-CM | POA: Insufficient documentation

## 2014-09-06 DIAGNOSIS — I69898 Other sequelae of other cerebrovascular disease: Secondary | ICD-10-CM | POA: Insufficient documentation

## 2014-09-06 DIAGNOSIS — I69920 Aphasia following unspecified cerebrovascular disease: Secondary | ICD-10-CM | POA: Insufficient documentation

## 2014-09-06 DIAGNOSIS — H53469 Homonymous bilateral field defects, unspecified side: Secondary | ICD-10-CM | POA: Insufficient documentation

## 2014-09-06 NOTE — Patient Instructions (Addendum)
Tibial nerve block with phenol today. This medication may start taking the fact today however full effect will be at about one week Duration of the effect is 3-6 months Side effects of medication may include right heel numbness or burning. Call if you have burning pain so we can recommend any medication for that.   Stop by Urgent care on [address] today since you are out of medications  See Neurology next week

## 2014-09-06 NOTE — Progress Notes (Signed)
Phenol neurolysis of the Right tibial nerve  Indication: Severe spasticity in the plantar flexor muscles which is not responding to medical management and other conservative care and interfering with functional use.  Informed consent was obtained after describing the risks and benefits of the procedure with the patient this includes bleeding bruising and infection as well as medication side effects. The patient elected to proceed and has given written consent. Patient placed in a prone position on the exam table. External DC stimulation was applied to the popliteal space using a nerve stimulator. Plantar flexion twitch was obtained. The popliteal region was prepped with Betadine and then entered with a 22-gauge 40 mm needle electrode under electrical stimulation guidance. Plantar flexion which was obtained and confirmed. Then 4 cc of 5% phenol were injected. The patient tolerated procedure well. Post procedure instructions and followup visit were given.

## 2014-09-13 ENCOUNTER — Ambulatory Visit: Payer: BC Managed Care – PPO | Admitting: Neurology

## 2014-09-14 ENCOUNTER — Encounter: Payer: Self-pay | Admitting: Neurology

## 2014-09-22 ENCOUNTER — Telehealth: Payer: Self-pay | Admitting: Physical Medicine & Rehabilitation

## 2014-09-22 NOTE — Telephone Encounter (Signed)
Faxed update [employer] forms to [employer]. Copy of documents sent to scan center

## 2014-11-07 ENCOUNTER — Ambulatory Visit: Payer: Self-pay | Admitting: Physical Medicine & Rehabilitation

## 2014-11-14 ENCOUNTER — Ambulatory Visit (HOSPITAL_BASED_OUTPATIENT_CLINIC_OR_DEPARTMENT_OTHER): Payer: BLUE CROSS/BLUE SHIELD | Admitting: Physical Medicine & Rehabilitation

## 2014-11-14 ENCOUNTER — Encounter: Payer: BC Managed Care – PPO | Attending: Physical Medicine & Rehabilitation

## 2014-11-14 ENCOUNTER — Encounter: Payer: Self-pay | Admitting: Physical Medicine & Rehabilitation

## 2014-11-14 VITALS — BP 146/78 | HR 104 | Resp 14

## 2014-11-14 DIAGNOSIS — I6931 Cognitive deficits following cerebral infarction: Secondary | ICD-10-CM

## 2014-11-14 DIAGNOSIS — G8110 Spastic hemiplegia affecting unspecified side: Secondary | ICD-10-CM | POA: Insufficient documentation

## 2014-11-14 DIAGNOSIS — I69920 Aphasia following unspecified cerebrovascular disease: Secondary | ICD-10-CM | POA: Diagnosis not present

## 2014-11-14 DIAGNOSIS — I69319 Unspecified symptoms and signs involving cognitive functions following cerebral infarction: Secondary | ICD-10-CM

## 2014-11-14 DIAGNOSIS — I69320 Aphasia following cerebral infarction: Secondary | ICD-10-CM

## 2014-11-14 NOTE — Patient Instructions (Signed)
No need to repeat phenoil nerve block  May consider Repeat Botox to R forearm if hand gets tighter  The right sided weakness is from the stroke sustained in 2014

## 2014-11-14 NOTE — Progress Notes (Signed)
Subjective:    [name], male    DOB: 1961/06/15, 54 y.o.   MRN: [id number] Sep 06, 2014 1:06 PM EST  09/06/2014   Phenol neurolysis of the Right tibial nerve  Patient states that the tibial nerve block was helpful  HPI 54 year old right-handed male with history of diabetes mellitus, left PCA infarct in the past with residual right-sided weakness and slurred speech which he did receive inpatient rehab services in December of 2014.  Patient noncompliant with medications at home due to financial issues.  He lives alone, used a walker prior to admission.  Admitted on April 30, 2014, with blurred vision and headache.  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  MRA of the brain with chronic occlusion left PCA  DATE OF ADMISSION:  05/04/2014 DATE OF DISCHARGE:  05/17/2014  Completed inpatient, home health and outpatient therapy. Patient confused, states that he return to work after his stroke in 2014 which he did not.Both his friend and I tried to convince him but he remains skeptical. He also is concerned about the right side of his body the weakness as well as his facial droop. We discussed that this was an effect from his stroke from 2014 which was a left PCA infarct. The more recent CVA with a right PCA infarct was October 2015 and that has had little residual other than a worsening of his cognitive function He continues to have a right field cut as well as aphasia Pain Inventory Average Pain 4 Pain Right Now 5 My pain is constant, burning, dull, stabbing, tingling and aching  In the last 24 hours, has pain interfered with the following? General activity 5 Relation with others 8 Enjoyment of life 7 What TIME of day is your pain at its worst? night Sleep (in general) Fair  Pain is worse with: some activites Pain improves with: therapy/exercise Relief from Meds: 6  Mobility use a  walker how many minutes can you walk? 10 ability to climb steps?  no do you drive?  no use a wheelchair  Function disabled: date disabled .  Neuro/Psych weakness trouble walking depression anxiety  Prior Studies Any changes since last visit?  no  Physicians involved in your care Any changes since last visit?  no   Family History  Problem Relation Age of Onset  . Cervical cancer Mother   . Hypertension Sister   . Diabetes Mellitus II Maternal Uncle    History   Social History  . Marital Status: Single    Spouse Name: N/A  . Number of Children: 2  . Years of Education: 12   Social History Main Topics  . Smoking status: Former Research scientist (life sciences)  . Smokeless tobacco: Never Used  . Alcohol Use: No  . Drug Use: No  . Sexual Activity: Not on file   Other Topics Concern  . None   Social History Narrative   Patient is single and has 2 children.   Patient is right handed.   Patient has hs education.   Patient drinks diet caffeine free sodas daily.   Past Surgical History  Procedure Laterality Date  . Tee without cardioversion N/A 05/03/2014    Procedure: TRANSESOPHAGEAL ECHOCARDIOGRAM (TEE);  Surgeon: Pixie Casino, MD;  Location: [facility] ENDOSCOPY;  Service: Cardiovascular;  Laterality: N/A;   Past Medical History  Diagnosis Date  . Diabetes mellitus   . Hypertension    BP 146/78 mmHg  Pulse 104  Resp 14  SpO2 97%  Opioid Risk Score:   Fall Risk Score: Low Fall Risk (0-5 points)`1  Depression screen PHQ 2/9  Depression screen PHQ 2/9 11/14/2014  Decreased Interest 2  Down, Depressed, Hopeless 1  PHQ - 2 Score 3  Altered sleeping 0  Tired, decreased energy 2  Change in appetite 0  Feeling bad or failure about yourself  3  Trouble concentrating 2  Moving slowly or fidgety/restless 1  Suicidal thoughts 0  PHQ-9 Score 11     Review of Systems  Eyes: Negative.   Respiratory: Negative.   Cardiovascular: Negative.   Gastrointestinal: Negative.     Endocrine: Negative.   Genitourinary: Negative.   Musculoskeletal: Positive for myalgias and back pain.  Neurological: Positive for dizziness, weakness and headaches.       PAIN IN FACE RIGHT SIDE, HEADACHES, RIGHT ARM PAIN, TROUBLE WALKING  Hematological: Negative.   Psychiatric/Behavioral: Positive for dysphoric mood. The patient is nervous/anxious.        Objective:   Physical Exam  Constitutional: He appears well-developed and well-nourished.  HENT:  Head: Normocephalic and atraumatic.  Eyes: Conjunctivae and EOM are normal. Pupils are equal, round, and reactive to light.  Neurological: He is alert.  Right upper extremity to minus elbow flexion and finger flexion trace elbow extension 0 finger extension 2 minus deltoid Right lower extremity 3 minus hip flexion and knee extension trace ankle dorsiflexion  Ambulates without assisted device but has a stifflegged gait on the right side. Hyperextension at the knee and plantar flexion at the ankle  Does not have his walker with him but uses this sometimes as well  Right facial droop Right homonymous hemianopsia  MAS 2-3 at the right finger flexors and wrist flexors as well as biceps  Psychiatric: He has a normal mood and affect.  Nursing note and vitals reviewed.         Assessment & Plan:  1. Left PCA infarct with chronic right spastic hemiplegia, right homonymous hemianopsia and aphasia Will Hold off on repeat tibial nerve block I'm not convinced that it really had much beneficial effect.  He may benefit from repeat Botox if he has increased problems with hand hygiene. As discussed the Botox would not return his hand and arm to normal functioning, Would however help reducing tone.  Reassess in 3 months  Also discussed that he needs a primary care physician on a regular basis to monitor his medications. He has seen somebody but complains of the cost. 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

## 2015-02-13 ENCOUNTER — Encounter: Payer: Worker's Compensation | Attending: Physical Medicine & Rehabilitation

## 2015-02-13 ENCOUNTER — Ambulatory Visit: Payer: Worker's Compensation | Admitting: Physical Medicine & Rehabilitation

## 2015-02-13 DIAGNOSIS — G8110 Spastic hemiplegia affecting unspecified side: Secondary | ICD-10-CM | POA: Insufficient documentation

## 2015-02-13 DIAGNOSIS — I6931 Cognitive deficits following cerebral infarction: Secondary | ICD-10-CM | POA: Insufficient documentation

## 2015-02-13 DIAGNOSIS — I69920 Aphasia following unspecified cerebrovascular disease: Secondary | ICD-10-CM | POA: Insufficient documentation

## 2015-02-28 ENCOUNTER — Telehealth: Payer: Self-pay | Admitting: *Deleted

## 2015-02-28 ENCOUNTER — Ambulatory Visit (INDEPENDENT_AMBULATORY_CARE_PROVIDER_SITE_OTHER): Payer: Worker's Compensation | Admitting: Physician Assistant

## 2015-02-28 ENCOUNTER — Other Ambulatory Visit: Payer: Self-pay | Admitting: Physician Assistant

## 2015-02-28 DIAGNOSIS — T7589XA Other specified effects of external causes, initial encounter: Secondary | ICD-10-CM

## 2015-02-28 LAB — HIV ANTIBODY (ROUTINE TESTING W REFLEX): HIV: NONREACTIVE

## 2015-02-28 NOTE — Telephone Encounter (Signed)
Crystal called on behalf of this patient stating she is trying to get this patient RX assistance for his Lipitor and some of his other medications.  Patient does not have PCP. Would you be willing to write for this patient... Last seen 4/25 and next appt is 8/23.  Please advise as soon as possible because this is a high risk patient and is out of all his medications

## 2015-02-28 NOTE — Progress Notes (Signed)
Source patient for Adam Callahan 06/23/1969.  He is not physically present at [facility], however his blood has been collected and will be sent for analysis. He tested Negative for HIV on 05/03/2014. He has never been tested for Hep C per [facility]. [name], MS, PA-C   7:45 PM, 02/28/2015    Orders Placed This Encounter  Procedures  . Hepatitis B surface antigen  . HIV rapid screen (bld or body fld expos)  . Hepatitis C Ab Reflex HCV RNA, QUANT

## 2015-02-28 NOTE — Telephone Encounter (Signed)
Patient has been instructed on multiple occasions to get a primary care physician he has not complied with this. I can see him for stroke rehabilitation but not for primary care

## 2015-03-01 LAB — HEPATITIS B SURFACE ANTIGEN: Hepatitis B Surface Ag: NEGATIVE

## 2015-03-01 LAB — HEPATITIS C ANTIBODY: HCV AB: NEGATIVE

## 2015-03-01 NOTE — Telephone Encounter (Signed)
Notified Clinical research associate.

## 2015-03-08 ENCOUNTER — Encounter (HOSPITAL_COMMUNITY): Payer: Self-pay | Admitting: *Deleted

## 2015-03-08 ENCOUNTER — Emergency Department (HOSPITAL_COMMUNITY)
Admission: EM | Admit: 2015-03-08 | Discharge: 2015-03-08 | Discharge status: Against Medical Advice | Payer: Medicaid Other | Attending: Emergency Medicine | Admitting: Emergency Medicine

## 2015-03-08 DIAGNOSIS — E1165 Type 2 diabetes mellitus with hyperglycemia: Secondary | ICD-10-CM | POA: Diagnosis not present

## 2015-03-08 DIAGNOSIS — I10 Essential (primary) hypertension: Secondary | ICD-10-CM | POA: Insufficient documentation

## 2015-03-08 DIAGNOSIS — R51 Headache: Secondary | ICD-10-CM | POA: Diagnosis not present

## 2015-03-08 LAB — CBC
HCT: 42.7 % (ref 39.0–52.0)
Hemoglobin: 14.4 g/dL (ref 13.0–17.0)
MCH: 28.6 pg (ref 26.0–34.0)
MCHC: 33.7 g/dL (ref 30.0–36.0)
MCV: 84.9 fL (ref 78.0–100.0)
Platelets: 328 10*3/uL (ref 150–400)
RBC: 5.03 MIL/uL (ref 4.22–5.81)
RDW: 12.7 % (ref 11.5–15.5)
WBC: 5.2 10*3/uL (ref 4.0–10.5)

## 2015-03-08 LAB — CBG MONITORING, ED: GLUCOSE-CAPILLARY: 344 mg/dL — AB (ref 65–99)

## 2015-03-08 LAB — BASIC METABOLIC PANEL
Anion gap: 9 (ref 5–15)
BUN: 9 mg/dL (ref 6–20)
CALCIUM: 9.3 mg/dL (ref 8.9–10.3)
CO2: 27 mmol/L (ref 22–32)
Chloride: 101 mmol/L (ref 101–111)
Creatinine, Ser: 0.67 mg/dL (ref 0.61–1.24)
GFR calc non Af Amer: >60 mL/min (ref >60)
Glucose, Bld: 326 mg/dL — ABNORMAL HIGH (ref 65–99)
Potassium: 4 mmol/L (ref 3.5–5.1)
SODIUM: 137 mmol/L (ref 135–145)

## 2015-03-08 NOTE — [facility] Counselor (Signed)
Pending review for possible placement with [facility].  

## 2015-03-08 NOTE — ED Notes (Signed)
Pt complains of a headache and weakness for 1 month. Pt states he ran out of diabetes and hypertension medication 1 month ago. Pt states he has not been checking his blood glucose levels.

## 2015-03-09 ENCOUNTER — Emergency Department (HOSPITAL_COMMUNITY)
Admission: EM | Admit: 2015-03-09 | Discharge: 2015-03-09 | Disposition: A | Discharge status: Home / Self Care | Payer: Self-pay | Attending: Emergency Medicine | Admitting: Emergency Medicine

## 2015-03-09 ENCOUNTER — Encounter (HOSPITAL_COMMUNITY): Payer: Self-pay

## 2015-03-09 DIAGNOSIS — Z760 Encounter for issue of repeat prescription: Secondary | ICD-10-CM | POA: Insufficient documentation

## 2015-03-09 DIAGNOSIS — Z79899 Other long term (current) drug therapy: Secondary | ICD-10-CM | POA: Insufficient documentation

## 2015-03-09 DIAGNOSIS — Z794 Long term (current) use of insulin: Secondary | ICD-10-CM | POA: Insufficient documentation

## 2015-03-09 DIAGNOSIS — R739 Hyperglycemia, unspecified: Secondary | ICD-10-CM

## 2015-03-09 DIAGNOSIS — R519 Headache, unspecified: Secondary | ICD-10-CM

## 2015-03-09 DIAGNOSIS — R358 Other polyuria: Secondary | ICD-10-CM | POA: Insufficient documentation

## 2015-03-09 DIAGNOSIS — I10 Essential (primary) hypertension: Secondary | ICD-10-CM | POA: Insufficient documentation

## 2015-03-09 DIAGNOSIS — Z87891 Personal history of nicotine dependence: Secondary | ICD-10-CM | POA: Insufficient documentation

## 2015-03-09 DIAGNOSIS — R51 Headache: Secondary | ICD-10-CM | POA: Insufficient documentation

## 2015-03-09 DIAGNOSIS — R631 Polydipsia: Secondary | ICD-10-CM | POA: Insufficient documentation

## 2015-03-09 DIAGNOSIS — E1165 Type 2 diabetes mellitus with hyperglycemia: Secondary | ICD-10-CM | POA: Insufficient documentation

## 2015-03-09 DIAGNOSIS — Z7982 Long term (current) use of aspirin: Secondary | ICD-10-CM | POA: Insufficient documentation

## 2015-03-09 DIAGNOSIS — R531 Weakness: Secondary | ICD-10-CM | POA: Insufficient documentation

## 2015-03-09 DIAGNOSIS — R632 Polyphagia: Secondary | ICD-10-CM | POA: Insufficient documentation

## 2015-03-09 LAB — URINALYSIS, ROUTINE W REFLEX MICROSCOPIC
BILIRUBIN URINE: NEGATIVE
Glucose, UA: >1000 mg/dL — AB
Hgb urine dipstick: NEGATIVE
KETONES UR: NEGATIVE mg/dL
Leukocytes, UA: NEGATIVE
NITRITE: NEGATIVE
PH: 5 (ref 5.0–8.0)
Protein, ur: NEGATIVE mg/dL
Specific Gravity, Urine: 1.036 — ABNORMAL HIGH (ref 1.005–1.030)
UROBILINOGEN UA: 0.2 mg/dL (ref 0.0–1.0)

## 2015-03-09 LAB — URINE MICROSCOPIC-ADD ON

## 2015-03-09 LAB — COMPREHENSIVE METABOLIC PANEL
ALK PHOS: 82 U/L (ref 38–126)
ALT: 27 U/L (ref 17–63)
ANION GAP: 10 (ref 5–15)
AST: 18 U/L (ref 15–41)
Albumin: 4.1 g/dL (ref 3.5–5.0)
BILIRUBIN TOTAL: 0.7 mg/dL (ref 0.3–1.2)
BUN: 9 mg/dL (ref 6–20)
CALCIUM: 9.3 mg/dL (ref 8.9–10.3)
CO2: 24 mmol/L (ref 22–32)
Chloride: 103 mmol/L (ref 101–111)
Creatinine, Ser: 0.65 mg/dL (ref 0.61–1.24)
Glucose, Bld: 282 mg/dL — ABNORMAL HIGH (ref 65–99)
Potassium: 4 mmol/L (ref 3.5–5.1)
Sodium: 137 mmol/L (ref 135–145)
TOTAL PROTEIN: 7.9 g/dL (ref 6.5–8.1)

## 2015-03-09 LAB — CBC WITH DIFFERENTIAL/PLATELET
Basophils Absolute: 0 10*3/uL (ref 0.0–0.1)
Basophils Relative: 1 % (ref 0–1)
Eosinophils Absolute: 0.1 10*3/uL (ref 0.0–0.7)
Eosinophils Relative: 2 % (ref 0–5)
HEMATOCRIT: 41.8 % (ref 39.0–52.0)
HEMOGLOBIN: 14.3 g/dL (ref 13.0–17.0)
LYMPHS ABS: 2 10*3/uL (ref 0.7–4.0)
Lymphocytes Relative: 42 % (ref 12–46)
MCH: 29.2 pg (ref 26.0–34.0)
MCHC: 34.2 g/dL (ref 30.0–36.0)
MCV: 85.3 fL (ref 78.0–100.0)
MONOS PCT: 7 % (ref 3–12)
Monocytes Absolute: 0.4 10*3/uL (ref 0.1–1.0)
NEUTROS ABS: 2.4 10*3/uL (ref 1.7–7.7)
NEUTROS PCT: 48 % (ref 43–77)
Platelets: 317 10*3/uL (ref 150–400)
RBC: 4.9 MIL/uL (ref 4.22–5.81)
RDW: 12.7 % (ref 11.5–15.5)
WBC: 4.9 10*3/uL (ref 4.0–10.5)

## 2015-03-09 LAB — CBG MONITORING, ED: Glucose-Capillary: 285 mg/dL — ABNORMAL HIGH (ref 65–99)

## 2015-03-09 MED ORDER — IBUPROFEN 800 MG PO TABS
800.0000 mg | ORAL_TABLET | Freq: Once | ORAL | Status: AC
  Administered 2015-03-09: 800 mg via ORAL
  Filled 2015-03-09: qty 1

## 2015-03-09 MED ORDER — ASPIRIN 81 MG PO CHEW: 81.0000 mg | CHEWABLE_TABLET | Freq: Every day | ORAL | Status: DC

## 2015-03-09 MED ORDER — CLOPIDOGREL BISULFATE 75 MG PO TABS: 75.0000 mg | ORAL_TABLET | Freq: Every day | ORAL | Status: DC

## 2015-03-09 MED ORDER — ATORVASTATIN CALCIUM 20 MG PO TABS: 20.0000 mg | ORAL_TABLET | Freq: Every day | ORAL | Status: DC

## 2015-03-09 MED ORDER — INSULIN ASPART 100 UNIT/ML FLEXPEN: 4.0000 [IU] | PEN_INJECTOR | Freq: Three times a day (TID) | SUBCUTANEOUS | Status: DC

## 2015-03-09 MED ORDER — INSULIN ASPART 100 UNIT/ML ~~LOC~~ SOLN
5.0000 [IU] | Freq: Once | SUBCUTANEOUS | Status: AC
  Administered 2015-03-09: 5 [IU] via SUBCUTANEOUS
  Filled 2015-03-09: qty 1

## 2015-03-09 MED ORDER — INSULIN GLARGINE 100 UNIT/ML SOLOSTAR PEN: 20.0000 [IU] | PEN_INJECTOR | Freq: Every day | SUBCUTANEOUS | Status: DC

## 2015-03-09 NOTE — ED Notes (Signed)
Pt c/o headache and weakness for 1 month.  Pain score 10/10.  Pt reports he ran out of diabetes and HTN medication 1 month ago. Sts he has not been checking his blood glucose levels.  Pt LWBS after Triage yesterday.

## 2015-03-09 NOTE — ED Notes (Signed)
Patient would like a referral to [facility] for primary health care.

## 2015-03-09 NOTE — Discharge Instructions (Signed)
1. Medications: usual home medications - all medications refilled today 2. Treatment: rest, drink plenty of fluids, take medications as directed  3. Follow Up: Please followup with your primary doctor in 7 days for discussion of your diagnoses and further evaluation after today's visit; if you do not have a primary care doctor use the resource guide provided to find one; Please return to the ER for worsening symptoms    Hyperglycemia Hyperglycemia occurs when the glucose (sugar) in your blood is too high. Hyperglycemia can happen for many reasons, but it most often happens to people who do not know they have diabetes or are not managing their diabetes properly.  CAUSES  Whether you have diabetes or not, there are other causes of hyperglycemia. Hyperglycemia can occur when you have diabetes, but it can also occur in other situations that you might not be as aware of, such as: Diabetes  If you have diabetes and are having problems controlling your blood glucose, hyperglycemia could occur because of some of the following reasons:  Not following your meal plan.  Not taking your diabetes medications or not taking it properly.  Exercising less or doing less activity than you normally do.  Being sick. Pre-diabetes  This cannot be ignored. Before people develop Type 2 diabetes, they almost always have "pre-diabetes." This is when your blood glucose levels are higher than normal, but not yet high enough to be diagnosed as diabetes. Research has shown that some long-term damage to the body, especially the heart and circulatory system, may already be occurring during pre-diabetes. If you take action to manage your blood glucose when you have pre-diabetes, you may delay or prevent Type 2 diabetes from developing. Stress  If you have diabetes, you may be "diet" controlled or on oral medications or insulin to control your diabetes. However, you may find that your blood glucose is higher than usual in the  hospital whether you have diabetes or not. This is often referred to as "stress hyperglycemia." Stress can elevate your blood glucose. This happens because of hormones put out by the body during times of stress. If stress has been the cause of your high blood glucose, it can be followed regularly by your caregiver. That way he/she can make sure your hyperglycemia does not continue to get worse or progress to diabetes. Steroids  Steroids are medications that act on the infection fighting system (immune system) to block inflammation or infection. One side effect can be a rise in blood glucose. Most people can produce enough extra insulin to allow for this rise, but for those who cannot, steroids make blood glucose levels go even higher. It is not unusual for steroid treatments to "uncover" diabetes that is developing. It is not always possible to determine if the hyperglycemia will go away after the steroids are stopped. A special blood test called an A1c is sometimes done to determine if your blood glucose was elevated before the steroids were started. SYMPTOMS  Thirsty.  Frequent urination.  Dry mouth.  Blurred vision.  Tired or fatigue.  Weakness.  Sleepy.  Tingling in feet or leg. DIAGNOSIS  Diagnosis is made by monitoring blood glucose in one or all of the following ways:  A1c test. This is a chemical found in your blood.  Fingerstick blood glucose monitoring.  Laboratory results. TREATMENT  First, knowing the cause of the hyperglycemia is important before the hyperglycemia can be treated. Treatment may include, but is not be limited to:  Education.  Change or  adjustment in medications.  Change or adjustment in meal plan.  Treatment for an illness, infection, etc.  More frequent blood glucose monitoring.  Change in exercise plan.  Decreasing or stopping steroids.  Lifestyle changes. HOME CARE INSTRUCTIONS   Test your blood glucose as directed.  Exercise  regularly. Your caregiver will give you instructions about exercise. Pre-diabetes or diabetes which comes on with stress is helped by exercising.  Eat wholesome, balanced meals. Eat often and at regular, fixed times. Your caregiver or nutritionist will give you a meal plan to guide your sugar intake.  Being at an ideal weight is important. If needed, losing as little as 10 to 15 pounds may help improve blood glucose levels. SEEK MEDICAL CARE IF:   You have questions about medicine, activity, or diet.  You continue to have symptoms (problems such as increased thirst, urination, or weight gain). SEEK IMMEDIATE MEDICAL CARE IF:   You are vomiting or have diarrhea.  Your breath smells fruity.  You are breathing faster or slower.  You are very sleepy or incoherent.  You have numbness, tingling, or pain in your feet or hands.  You have chest pain.  Your symptoms get worse even though you have been following your caregiver's orders.  If you have any other questions or concerns. Document Released: 01/01/2001 Document Revised: 09/30/2011 Document Reviewed: 11/04/2011 [facility] Patient Information 2015 [place], [place]. This information is not intended to replace advice given to you by your health care provider. Make sure you discuss any questions you have with your health care provider.    Emergency Department Resource Guide 1) Find a Doctor and Pay Out of Pocket Although you won't have to find out who is covered by your insurance plan, it is a good idea to ask around and get recommendations. You will then need to call the office and see if the doctor you have chosen will accept you as a new patient and what types of options they offer for patients who are self-pay. Some doctors offer discounts or will set up payment plans for their patients who do not have insurance, but you will need to ask so you aren't surprised when you get to your appointment.  2) Contact Your Local Health  Department Not all health departments have doctors that can see patients for sick visits, but many do, so it is worth a call to see if yours does. If you don't know where your local health department is, you can check in your phone book. The CDC also has a tool to help you locate your state's health department, and many state websites also have listings of all of their local health departments.  3) Find a [facility] If your illness is not likely to be very severe or complicated, you may want to try a walk in clinic. These are popping up all over the country in pharmacies, drugstores, and shopping centers. They're usually staffed by nurse practitioners or physician assistants that have been trained to treat common illnesses and complaints. They're usually fairly quick and inexpensive. However, if you have serious medical issues or chronic medical problems, these are probably not your best option.  No Primary Care Doctor: - Call Health Connect at  [phone] - they can help you locate a primary care doctor that  accepts your insurance, provides certain services, etc. - Physician Referral Service- [phone]  Chronic Pain Problems: Organization         Address  Phone   Notes  [facility]  [phone] Patients need to be referred by their primary care doctor.   Medication Assistance: Organization         Address  Phone   Notes  [facility] Medication [facility] [address] [place], [place] [address] [phone] --Must be a resident of [facility] -- Must have NO insurance coverage whatsoever (no Medicaid/ Medicare, etc.) -- The pt. MUST have a primary care doctor that directs their care regularly and follows them in the community   MedAssist  [phone]   [employer]  [phone]    Agencies that provide inexpensive medical care: Organization         Address  Phone   Notes  [place]  [phone]   [facility]    [phone]   [facility] [place],  [address] [phone]   [place] [address] [place] [phone]   Planned Parenthood    [phone]   [facility]    [phone]   [place] and [place] [address], [place] Phone:  [phone], Fax:  [phone] Hours of Operation:  9 am - 6 pm, M-F.  Also accepts Medicaid/Medicare and self-pay.  [facility] for [place] [address], [address], [place] Phone: [phone], Fax: [phone]. Hours of Operation:  8:30 am - 5:30 pm, M-F.  Also accepts Medicaid and self-pay.  [facility] [place] [address], [place] Phone: [phone]   [place], [place], [place] [phone], Ext. [phone] Mondays & Thursdays: 7-9 AM.  First 15 patients are seen on a first come, first serve basis.    [place] Providers:  Organization         Address  Phone   Notes  [facility] [address],  [phone] Also accepts self-pay patients.  [facility] [address], [place]  [phone]   [place], [address], [place] [phone]   [facility] [address] [place] [phone]   Lucianne Lei [address], [place]   [phone] Only accepts [place] Access [place] patients after they have their name applied to their card.   Self-Pay (no insurance) in [facility]:  Organization         Address  Phone   Notes  Sickle Cell Patients, [facility] [place] [phone]   [facility] [place] [phone]   [facility] [place]  [place], [place],  [place] [phone]   [facility]/Dr. Osei-Bonsu  [address] [place] or [address], [place] [phone] Phone number for both [place] and [place] locations is the same.  Urgent Medical and [facility] [address], [place] [phone]   [facility] [address], [place] or [address] [phone]   [facility] [address] [place] [phone], phone; [phone], fax Sees patients 1st and 3rd Saturday of every month.  Must not qualify for public or private insurance (i.e. Medicaid, Medicare, [place]  Choice, Veterans' Benefits)  Household income should be no more than 200% of the poverty level The clinic cannot treat you if you are pregnant or think you are pregnant  Sexually transmitted diseases are not treated at the clinic.    Dental Care: Organization         Address  Phone  Notes  [facility] of [facility] [phone] Accepts children up to age 13 who are enrolled in [place] or [place]; pregnant women with a Medicaid card; and children who have applied for Medicaid or [place] Health Choice, but were declined, whose parents can pay a reduced fee at time of service.  [facility] Department of [facility]  [address], [place] [phone] Accepts children up to age 39 who are enrolled in [place] or [place]; pregnant women with a Medicaid card; and children who have applied for Medicaid or [place] Health Choice, but were declined, whose parents can pay a reduced fee at time of service.  [place] Adult [facility] Access PROGRAM  [place] [phone] Patients are seen by appointment only. Walk-ins are not accepted. [place] will see patients 2 years of age and older. Monday - Tuesday (8am-5pm) Most Wednesdays  (8:30-5pm) $30 per visit, cash only  [facility] Adult Dental Access PROGRAM  [address], [facility] [phone] Patients are seen by appointment only. Walk-ins are not accepted. [place] will see patients 51 years of age and older. One Wednesday Evening (Monthly: Volunteer Based).  $30 per visit, cash only  [place]  [phone] for adults; Children under age 30, call Graduate Pediatric Dentistry at [phone]. Children aged 64-14, please call [phone] to request a pediatric application.  Dental services are provided in all areas of dental care including fillings, crowns and bridges, complete and partial dentures, implants, gum treatment, root canals, and extractions. Preventive care is also provided. Treatment is provided to both adults and children. Patients are selected via a lottery and there is often a waiting list.   [facility] [address] [place]  [phone] [email or web address]   [facility] [address] [place], [place] [phone], Ext. [phone] Second and Fourth Thursday of each month, opens at 6:30 AM; Clinic ends at 9 AM.  Patients are seen on a first-come first-served basis, and a limited number are seen during each clinic.   [facility]  [address] Hillard Danker [place], [place] [phone]   Eligibility Requirements You must have lived in [place], [place], or [place] counties for at least the last three months.   You cannot be eligible for state or federal sponsored [employer], including [employer], [place], or [employer].   You generally cannot be eligible for healthcare insurance through your employer.    How to apply: Eligibility screenings are held every Tuesday and Wednesday afternoon from 1:00 pm until 4:00 pm. You do not need an appointment for the interview!  [facility] [address], [place], [place]   [place] Department  [place]  [phone]    Behavioral Health Resources in the Community: Intensive Outpatient Programs Organization         Address  Phone  Notes  [place].  [address] [place], [place] [phone]   [facility] [address] [place], [place]   ADS: Alcohol & Drug Svcs [address], [place], [place]   [place] [address]  [place], [place] or [phone]   Substance Abuse Resources Organization         Address  Phone  Notes  Alcohol and Drug Services  [phone]   [place]  [phone]   [facility] [place]   Chinita Pester  [phone]   Residential & Outpatient Substance Abuse Program  [phone]   Psychological Services Organization         Address  Phone  Notes  [facility] [place]  [place]  [phone]   [place] [address] [place] or [phone]    Mobile Crisis Teams Organization         Address  Phone  Notes  [facility], Mobile Crisis Care Unit  [phone]   [facility]  [address] [place], [place]   [place] [address], [place] [phone]    Self-Help/Support Groups Organization         Address  Phone             Notes  [place]. of [place] - variety of support groups  [place] Call for more information  Narcotics Anonymous (NA), [facility] [address], [employer] [place]  2 meetings at this location   Special educational needs teacher         Address  Phone  Notes  [facility] Residential Treatment [place],    [place]  [phone]   [facility]  [address] [place] [address], [place], [place]   Paia [place], [place] [phone] Admissions: 8am-3pm M-F  Incentives Substance [place] [address],    [place], [place] [phone]   [facility] [address] [place], [place], [place]   [facility] [address],  [place], [place]   [facility] Programs - Intensive Outpatient [address] Kristeen [address], [place], [place]   [facility] ([address] [place].,  [place], [place] [phone] or [phone]   [facility] Services (RTS) [address] [place], [place] Accepts Medicaid  [facility] [place] [address]  [place] [phone] Substance Abuse/Addiction Treatment   [facility] Organization         Address  Phone  Notes  [facility]  [phone]   Domenic Schwab, PhD [address] Arlis Porta [place], [place]   [phone] or [phone]   [facility] [place], [place] [phone]   [facility] [address], [place], [place] [phone] Insurance/Medicaid/sponsorship through [facility] and Families [address]                                    [place], [place] [phone] [place] [address], [place] [phone]    Dr. Adele Schilder  [phone]   [facility] Dept. 1) [address] [place]  2) [place] 3)  [place], [place] [phone]  9802104104   [facility] Child Abuse Hotline 717-740-8457 or [phone] (After Hours)

## 2015-03-09 NOTE — ED Notes (Signed)
Bed: WA22 Expected date:  Expected time:  Means of arrival:  Comments: 

## 2015-03-09 NOTE — ED Provider Notes (Signed)
CSN: [id number]     Arrival date & time 03/09/15  9518 History   First MD Initiated Contact with Patient 03/09/15 1019     Chief Complaint  Patient presents with  . Headache  . Weakness  . Medication Refill     (Consider location/radiation/quality/duration/timing/severity/associated sxs/prior Treatment) Patient is a 54 y.o. male presenting with headaches and weakness. The history is provided by the patient and medical records. No language interpreter was used.  Headache Associated symptoms: weakness ( generalized)   Associated symptoms: no abdominal pain, no back pain, no cough, no diarrhea, no fatigue, no fever, no nausea, no neck stiffness and no vomiting   Weakness Associated symptoms include headaches and weakness ( generalized). Pertinent negatives include no abdominal pain, chest pain, coughing, diaphoresis, fatigue, fever, nausea, rash or vomiting.     Adam Callahan is a 54 y.o. male  with a hx of insulin-dependent diabetes and hypertension presents to the Emergency Department complaining of gradual, persistent, generalized and throbbing headache onset 4 days ago. Patient reports headache is at 10/10 but denies association with visual changes, double vision, blurred vision, weakness, numbness, difficulty walking, seizures, photophobia, nausea, vomiting.  No treatments attempted prior to arrival.  Patient also reports he has a history of insulin-dependent diabetes and hypertension for which he has been out of his medications for greater than one month. He reports that he does not have a primary care physician. He endorses polyuria, polydipsia, generalized weakness and generally feeling unwell. He states he has not been checking his blood glucose levels. Record review shows the patient checked in yesterday however left after triage. Patient reports his ride had to leave yesterday.  Patient states nothing makes the symptoms better or worse.  Patient also denies neck pain, neck stiffness,  fevers, chills, chest pain, shortness of breath, abdominal pain, nausea, vomiting, diarrhea, syncope, dysuria, hematuria.  Past Medical History  Diagnosis Date  . Diabetes mellitus   . Hypertension    Past Surgical History  Procedure Laterality Date  . Tee without cardioversion N/A 05/03/2014    Procedure: TRANSESOPHAGEAL ECHOCARDIOGRAM (TEE);  Surgeon: Pixie Casino, MD;  Location: [facility] ENDOSCOPY;  Service: Cardiovascular;  Laterality: N/A;   Family History  Problem Relation Age of Onset  . Cervical cancer Mother   . Hypertension Sister   . Diabetes Mellitus II Maternal Uncle    Social History  Substance Use Topics  . Smoking status: Former Research scientist (life sciences)  . Smokeless tobacco: Never Used  . Alcohol Use: No    Review of Systems  Constitutional: Negative for fever, diaphoresis, appetite change, fatigue and unexpected weight change.  HENT: Negative for mouth sores.   Eyes: Negative for visual disturbance.  Respiratory: Negative for cough, chest tightness, shortness of breath and wheezing.   Cardiovascular: Negative for chest pain.  Gastrointestinal: Negative for nausea, vomiting, abdominal pain, diarrhea and constipation.  Endocrine: Positive for polydipsia, polyphagia and polyuria.  Genitourinary: Negative for dysuria, urgency, frequency and hematuria.  Musculoskeletal: Negative for back pain and neck stiffness.  Skin: Negative for rash.  Allergic/Immunologic: Negative for immunocompromised state.  Neurological: Positive for weakness ( generalized) and headaches. Negative for syncope and light-headedness.  Hematological: Does not bruise/bleed easily.  Psychiatric/Behavioral: Negative for sleep disturbance. The patient is not nervous/anxious.       Allergies  Review of patient's allergies indicates no known allergies.  Home Medications   Prior to Admission medications   Medication Sig Start Date End Date Taking? Authorizing Provider  Multiple Vitamin (MULTIVITAMIN WITH  MINERALS) TABS tablet Take 1 tablet by mouth daily.   Yes Historical Provider, MD  aspirin 81 MG chewable tablet Chew 1 tablet (81 mg total) by mouth daily. 03/09/15   Seanmichael Salmons, PA-C  atorvastatin (LIPITOR) 20 MG tablet Take 1 tablet (20 mg total) by mouth daily at 6 PM. 03/09/15   Jarrett Soho Anadelia Kintz, PA-C  clopidogrel (PLAVIX) 75 MG tablet Take 1 tablet (75 mg total) by mouth daily with breakfast. 03/09/15   Jarrett Soho Brittony Billick, PA-C  insulin aspart (NOVOLOG FLEXPEN) 100 UNIT/ML FlexPen Inject 4 Units into the skin 3 (three) times daily with meals. 03/09/15   Reno Clasby, PA-C  Insulin Glargine (LANTUS) 100 UNIT/ML Solostar Pen Inject 20 Units into the skin daily at 10 pm. 03/09/15   Jarrett Soho Emil Klassen, PA-C   BP 162/95 mmHg  Pulse 92  Temp(Src) 97.8 F (36.6 C) (Oral)  Resp 18  SpO2 98% Physical Exam  Constitutional: He is oriented to person, place, and time. He appears well-developed and well-nourished. No distress.  Awake, alert, nontoxic appearance  HENT:  Head: Normocephalic and atraumatic.  Mouth/Throat: Oropharynx is clear and moist. No oropharyngeal exudate.  Eyes: Conjunctivae and EOM are normal. Pupils are equal, round, and reactive to light. No scleral icterus.  No horizontal, vertical or rotational nystagmus  Neck: Normal range of motion. Neck supple.  Full active and passive ROM without pain No midline or paraspinal tenderness No nuchal rigidity or meningeal signs  Cardiovascular: Normal rate, regular rhythm, normal heart sounds and intact distal pulses.   No murmur heard. Pulmonary/Chest: Effort normal and breath sounds normal. No respiratory distress. He has no wheezes. He has no rales.  Equal chest expansion  Abdominal: Soft. Bowel sounds are normal. He exhibits no mass. There is no tenderness. There is no rebound and no guarding.  Musculoskeletal: Normal range of motion. He exhibits no edema.  Lymphadenopathy:    He has no cervical adenopathy.   Neurological: He is alert and oriented to person, place, and time. He has normal reflexes. No cranial nerve deficit. He exhibits normal muscle tone. Coordination normal.  Mental Status:  Alert, oriented, thought content appropriate. Speech fluent without evidence of aphasia. Able to follow 2 step commands without difficulty.  Cranial Nerves:  II:  Peripheral visual fields grossly normal, pupils equal, round, reactive to light III,IV, VI: ptosis not present, extra-ocular motions intact bilaterally  V,VII: smile symmetric, facial light touch sensation equal VIII: hearing grossly normal bilaterally  IX,X: midline uvula rise  XI: bilateral shoulder shrug equal and strong XII: midline tongue extension  Motor:  5/5 in upper and lower extremities bilaterally including strong and equal grip strength and dorsiflexion/plantar flexion Sensory: Pinprick and light touch normal in all extremities.  Deep Tendon Reflexes: 2+ and symmetric  Cerebellar: normal finger-to-nose with bilateral upper extremities Gait: baseline gait with cane and normal balance CV: distal pulses palpable throughout   Skin: Skin is warm and dry. No rash noted. He is not diaphoretic.  Psychiatric: He has a normal mood and affect. His behavior is normal. Judgment and thought content normal.  Nursing note and vitals reviewed.   ED Course  Procedures (including critical care time) Labs Review Labs Reviewed  COMPREHENSIVE METABOLIC PANEL - Abnormal; Notable for the following:    Glucose, Bld 282 (*)    All other components within normal limits  URINALYSIS, ROUTINE W REFLEX MICROSCOPIC (NOT AT [facility]) - Abnormal; Notable for the following:    Specific Gravity, Urine 1.036 (*)    Glucose, UA >  1000 (*)    All other components within normal limits  URINE MICROSCOPIC-ADD ON - Abnormal; Notable for the following:    Bacteria, UA FEW (*)    All other components within normal limits  CBG MONITORING, ED - Abnormal; Notable for the  following:    Glucose-Capillary 285 (*)    All other components within normal limits  CBC WITH DIFFERENTIAL/PLATELET    Imaging Review No results found. I have personally reviewed and evaluated these images and lab results as part of my medical decision-making.   EKG Interpretation None      MDM   Final diagnoses:  Nonintractable headache, unspecified chronicity pattern, unspecified headache type  Hyperglycemia without ketosis  Medication refill   Adam Callahan presents with hypertension, hyperglycemia and headache. Patient with normal neurologic exam. Previous history of CVA and he walks with a cane but is ambulatory with steady gait and normal balance.  No signs of skin infection.  Labs reassuring. No ketones in his urine. No leukocytosis.  CBG 285 and anion gap 10. No evidence of DKA. Patient given his insulin here in the emergency department along with ibuprofen for his headache.  1:30 PM Patient reports complete resolution of his headache. He reports that he feels well.    Orthostatic Vitals:  13:01:07 Vital Signs JB  Vital Signs - Pulse Rate: 76 ; BP: 134/84 mmHg ; BP Location: Left Arm ; BP Method: Automatic ; Patient Position (if appropriate): Lying      13:03:16 Vital Signs JB  Vital Signs - Pulse Rate: 84 ; BP: 142/92 mmHg ; BP Location: Left Arm ; BP Method: Automatic ; Patient Position (if appropriate): Sitting     Patients without orthostasis. Concentrated urine. Discussed the need for adequate watertight hydration. He has tolerated 3 glasses of water here in the emergency department without complication.  Patient given referral to [place]. All of his medications were refilled for 1 month. Strict return precautions discussed.  BP 162/95 mmHg  Pulse 92  Temp(Src) 97.8 F (36.6 C) (Oral)  Resp 18  SpO2 98%      Abigail Butts, PA-C 03/09/15 [place], PA-C 03/09/15 1658  Charlesetta Shanks,  MD 03/11/15 1556

## 2015-03-14 ENCOUNTER — Ambulatory Visit: Payer: Worker's Compensation | Admitting: Physical Medicine & Rehabilitation

## 2015-03-14 ENCOUNTER — Encounter: Payer: Worker's Compensation | Attending: Physical Medicine & Rehabilitation

## 2015-03-14 DIAGNOSIS — G8110 Spastic hemiplegia affecting unspecified side: Secondary | ICD-10-CM | POA: Insufficient documentation

## 2015-03-14 DIAGNOSIS — I69920 Aphasia following unspecified cerebrovascular disease: Secondary | ICD-10-CM | POA: Insufficient documentation

## 2015-03-14 DIAGNOSIS — I6931 Cognitive deficits following cerebral infarction: Secondary | ICD-10-CM | POA: Insufficient documentation

## 2015-07-27 IMAGING — CR DG LUMBAR SPINE COMPLETE 4+V
5 series · 5 of 5 positions shown · non-contrast
Comparison: None

CLINICAL DATA: Fall with low back pain.

EXAM:
LUMBAR SPINE - COMPLETE 4+ VIEW

[t lumbar spine ap]
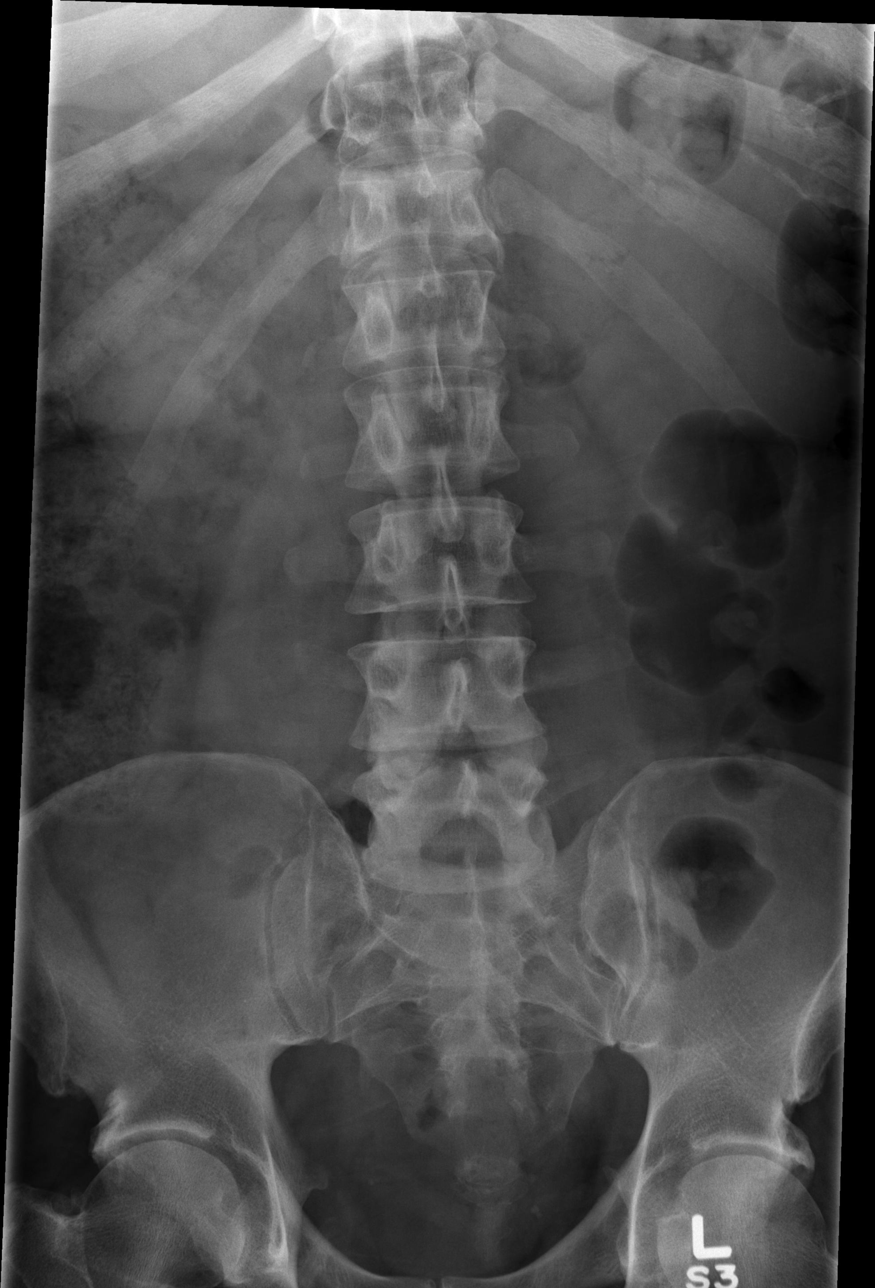

[t lumbar spine obl (1 of 2)]
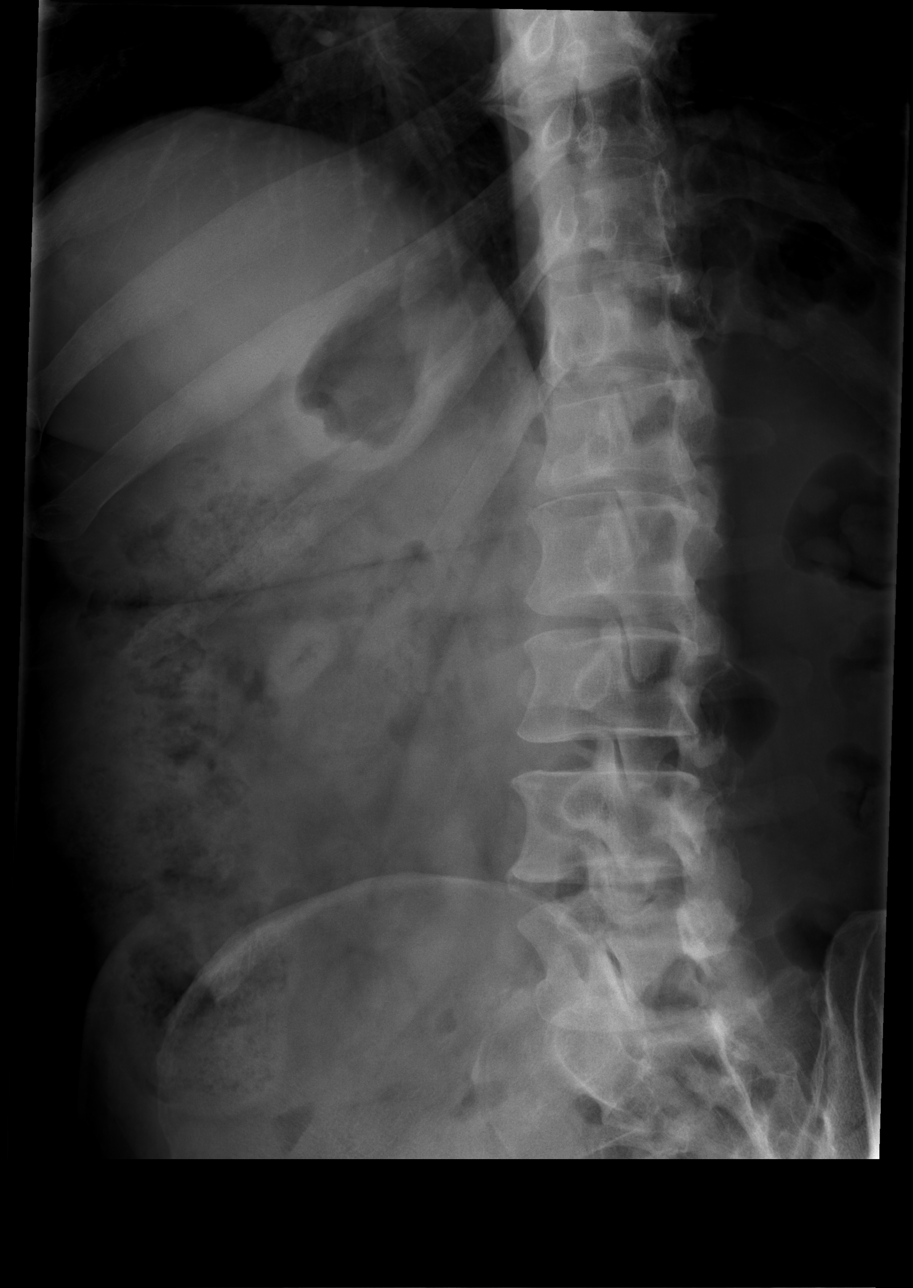

[t lumbar spine obl (2 of 2)]
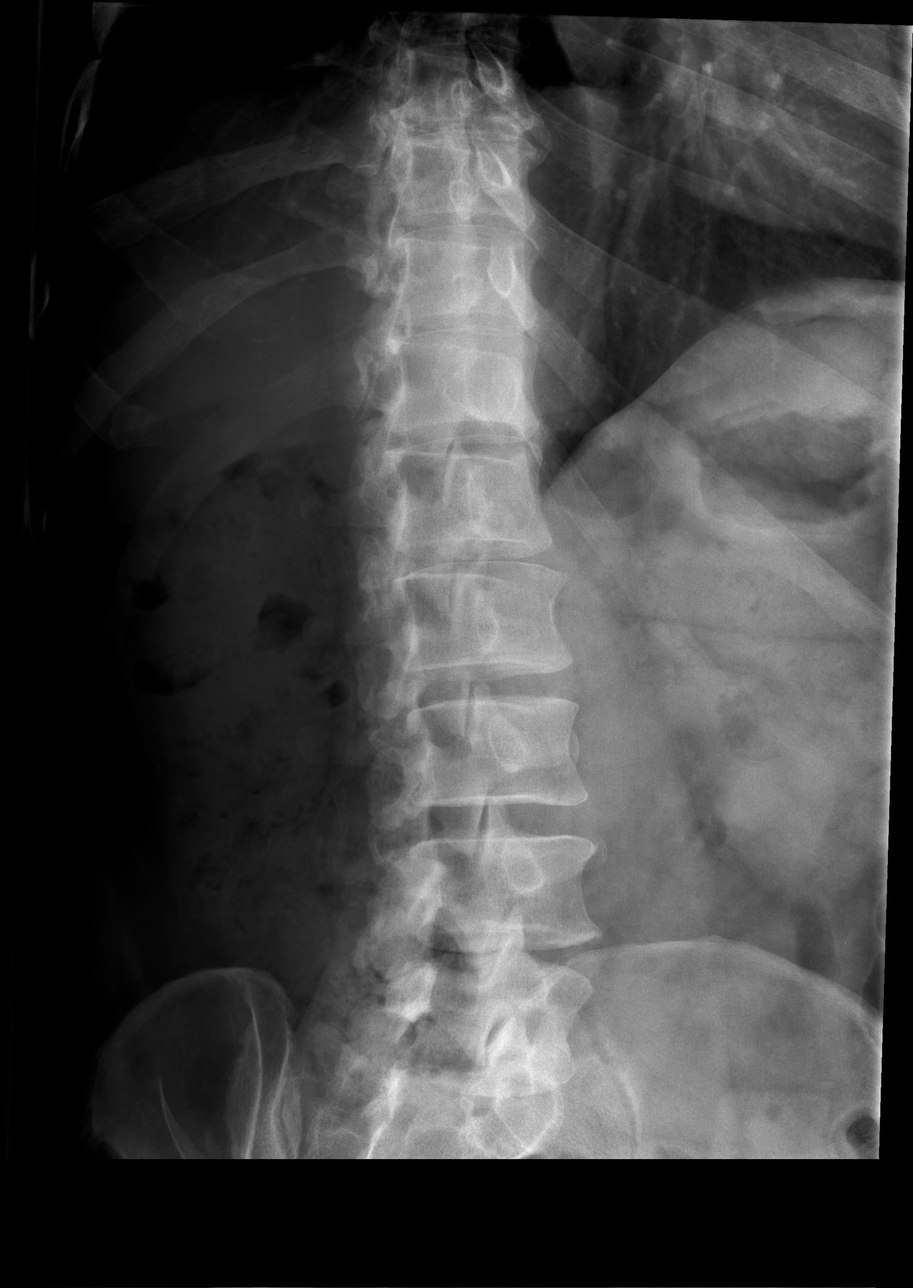

[t lumbar spine lat]
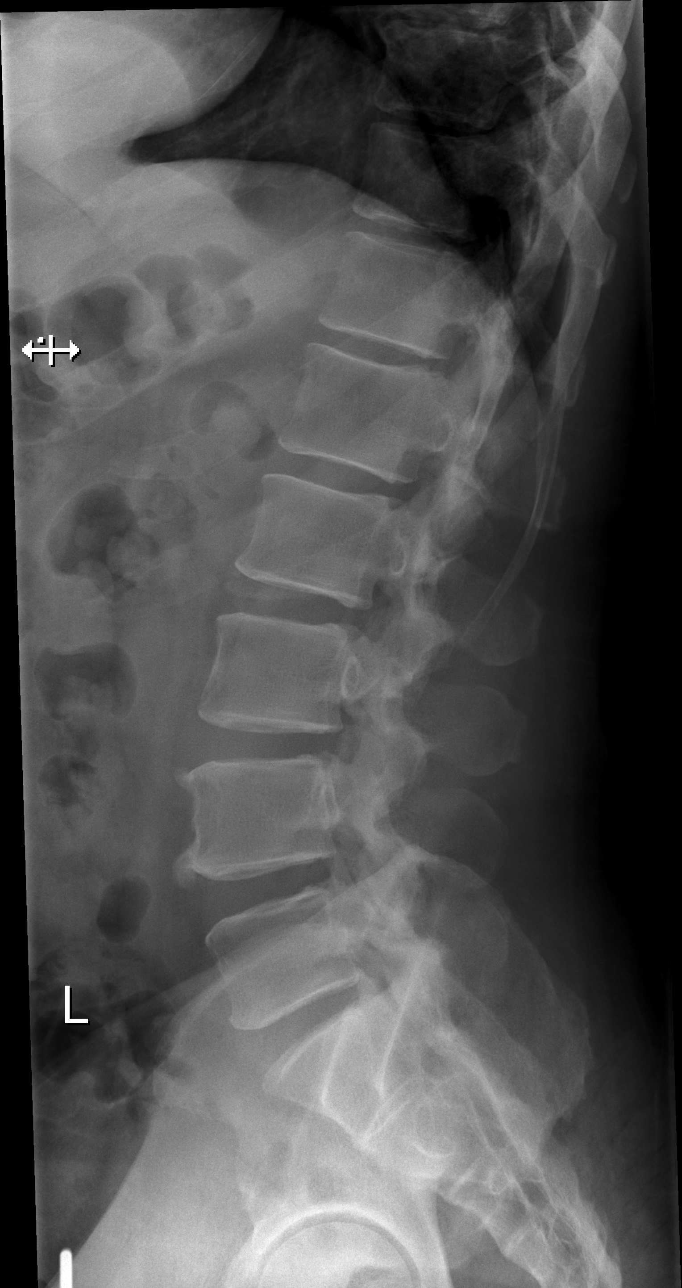

[t lumbar l-5 s-1 spot]
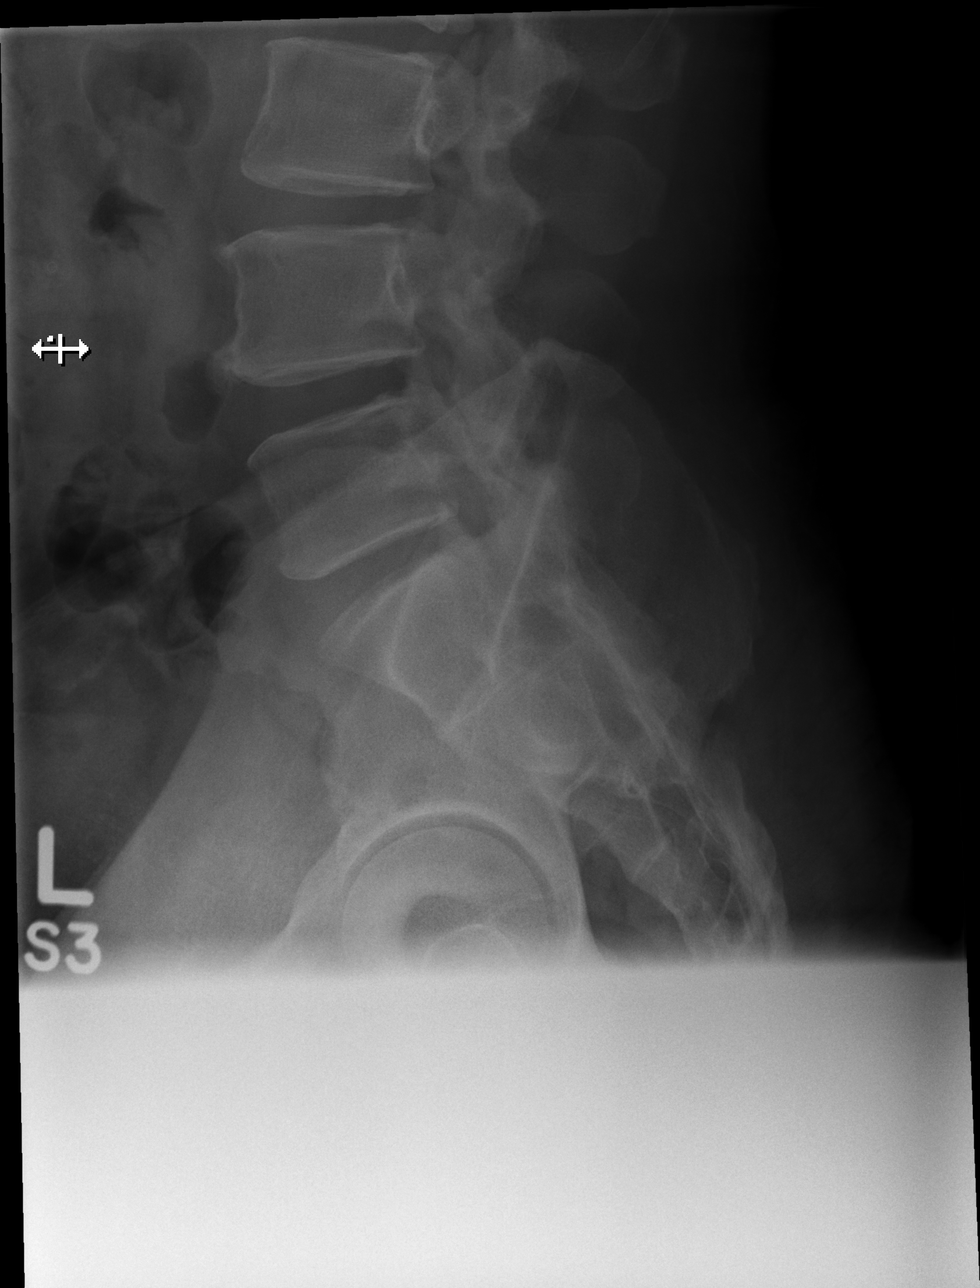

[5 of 5 positions shown; findings below may reference images not displayed]

FINDINGS: 5 non rib-bearing lumbar type vertebra are identified in normal
alignment.

There is no evidence of fracture or subluxation.

No focal bony lesions or spondylolysis noted.

Minimal multilevel degenerative disc disease noted.
IMPRESSION: No static evidence of acute injury to the lumbar spine.

## 2015-07-27 IMAGING — CR DG PELVIS 1-2V
1 series · 1 of 1 positions shown · non-contrast
Comparison: None.

CLINICAL DATA: 52-year-old male with pelvic pain following fall

EXAM:
PELVIS - 1-2 VIEW

[t pelvis ap]
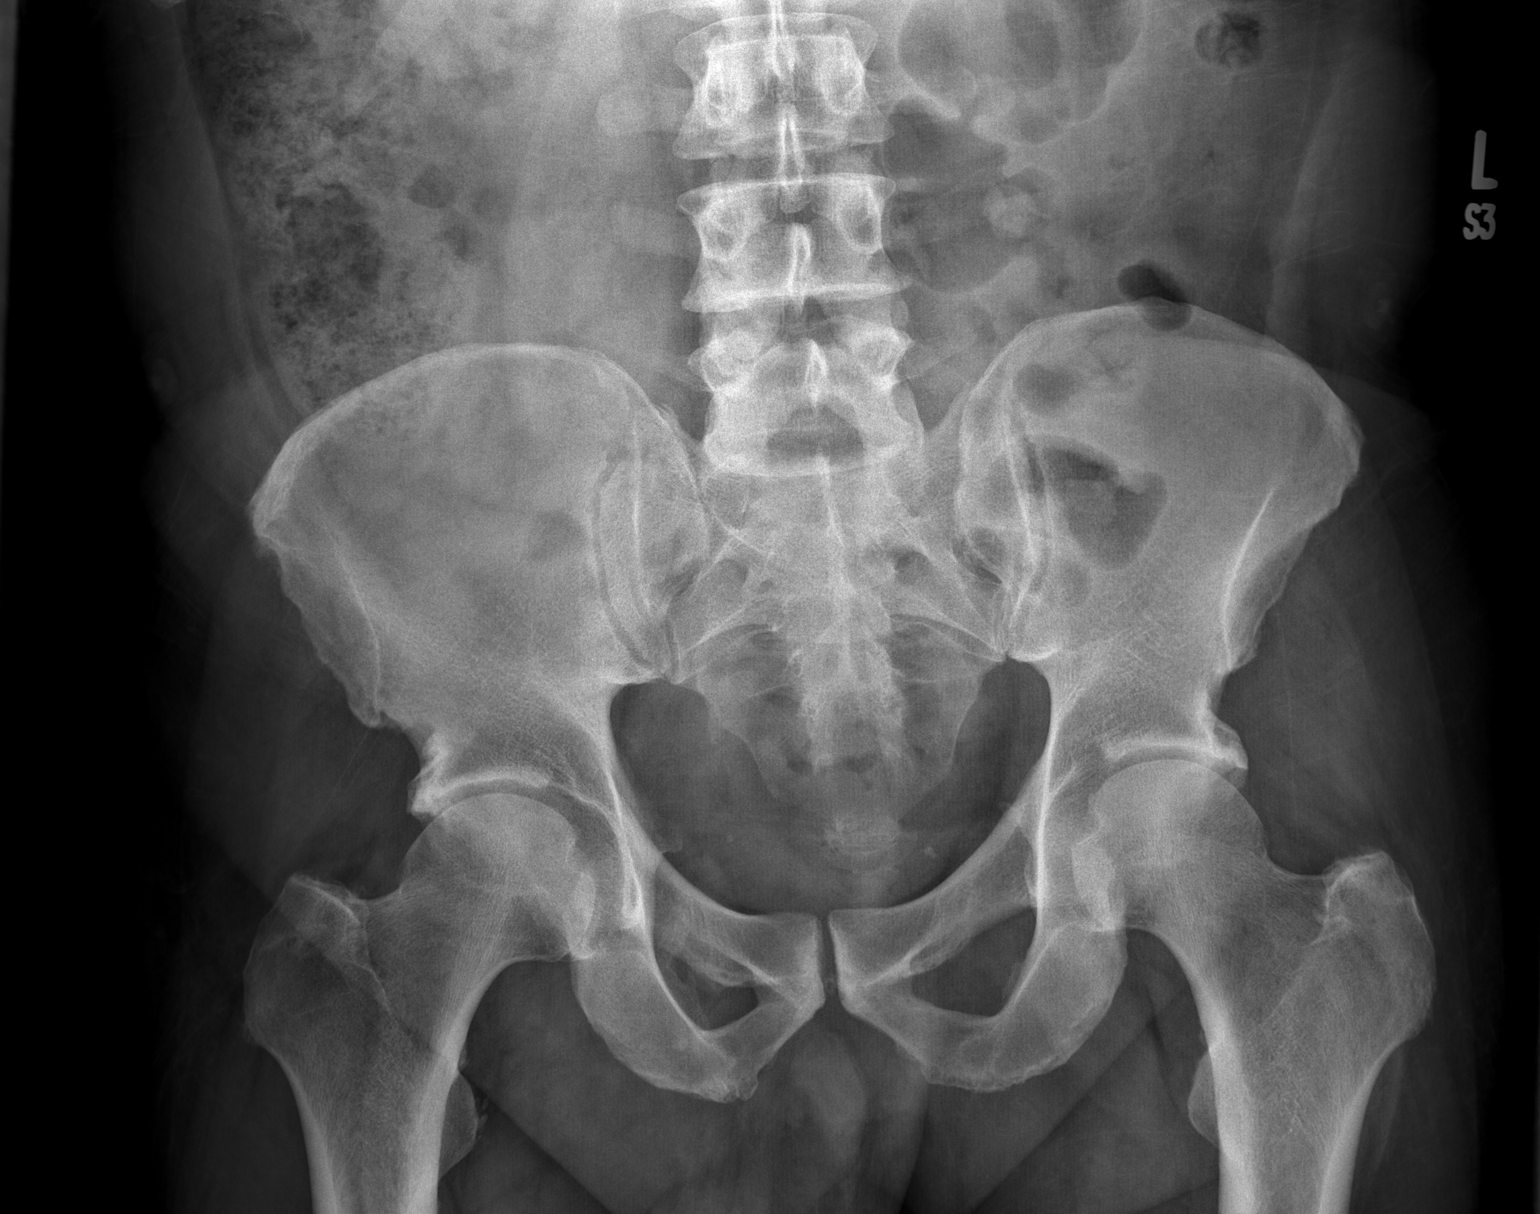

[1 of 1 positions shown; findings below may reference images not displayed]

FINDINGS: There is no evidence of fracture, subluxation or dislocation.

Mild degenerative changes within the hips noted.

No focal bony lesions are present.
IMPRESSION: No acute bony abnormalities..

## 2015-07-27 IMAGING — CT CT HEAD W/O CM
2 of 4 series · 11 of 47 positions shown, 13 images · non-contrast
Comparison: Head and neck CT 06/19/2010

CLINICAL DATA: Fall from ladder.  Blunt trauma to head and back.

EXAM:
CT HEAD WITHOUT CONTRAST
CT CERVICAL SPINE WITHOUT CONTRAST
TECHNIQUE: Multidetector CT imaging of the head and cervical spine was
performed following the standard protocol without intravenous
contrast. Multiplanar CT image reconstructions of the cervical spine
were also generated.

[Series 5: coronals · coronal · 0.30mm/px · 3 of 54 slices shown]
[im 18/54  brain]
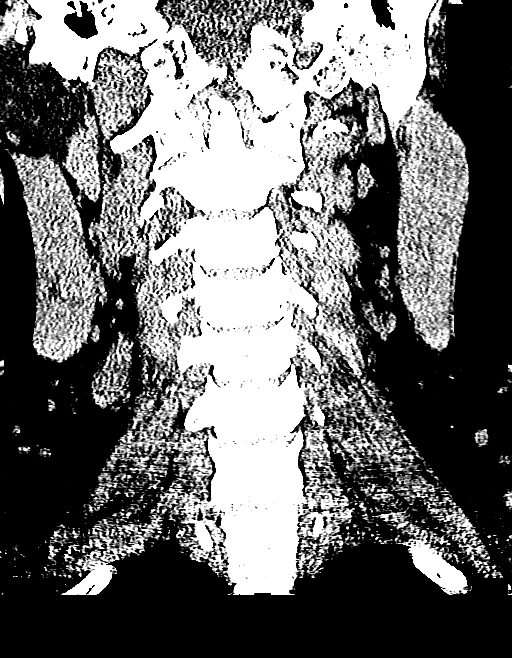
[im 24/54  brain]
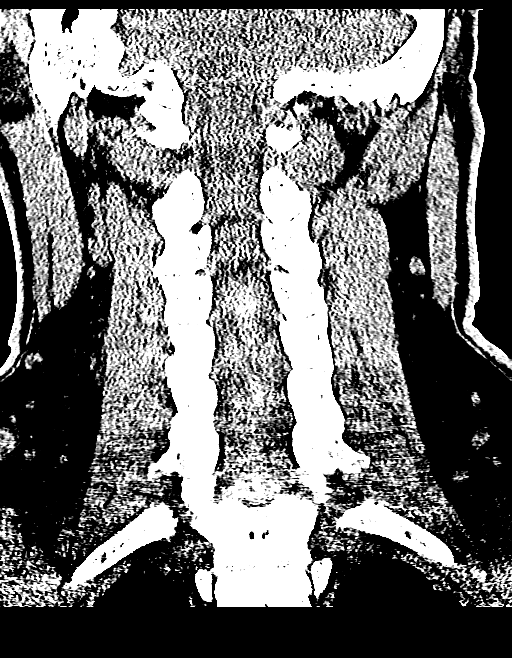
[im 30/54  brain]
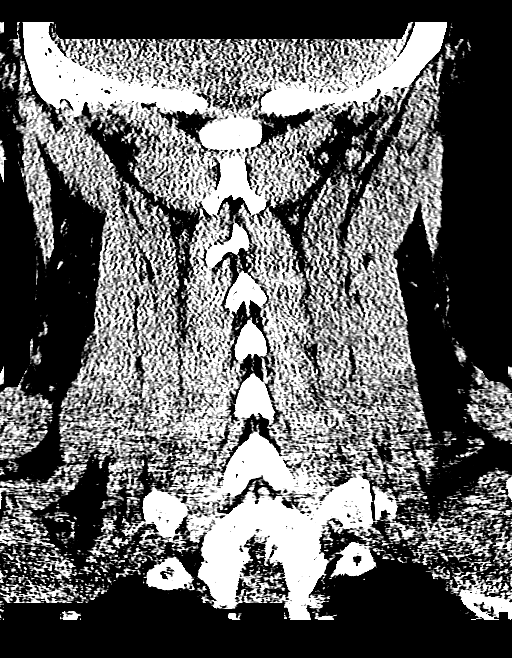

[Series 7: orthogonals · axial · 0.23mm/px · z∈[-281,-140]mm · 8 of 90 slices shown, 10 images]
[im 7/90  brain]
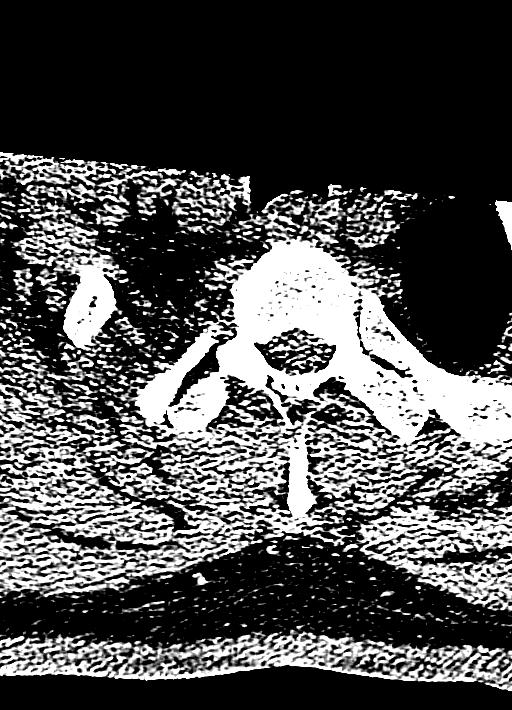
[im 7/90  bone]
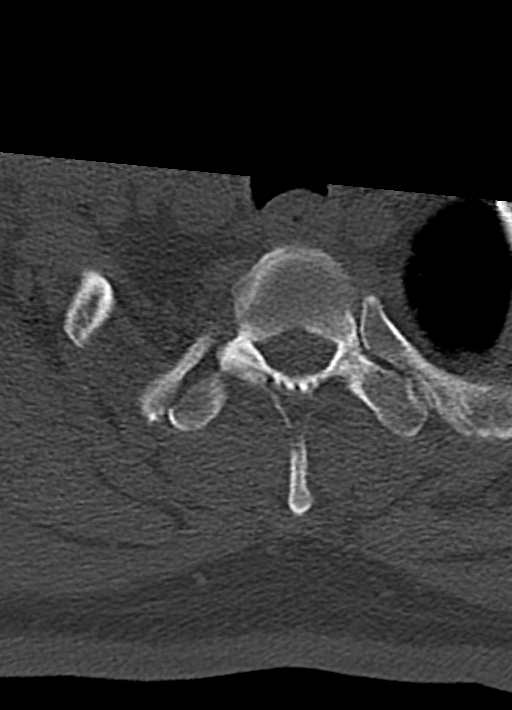
[im 20/90  brain]
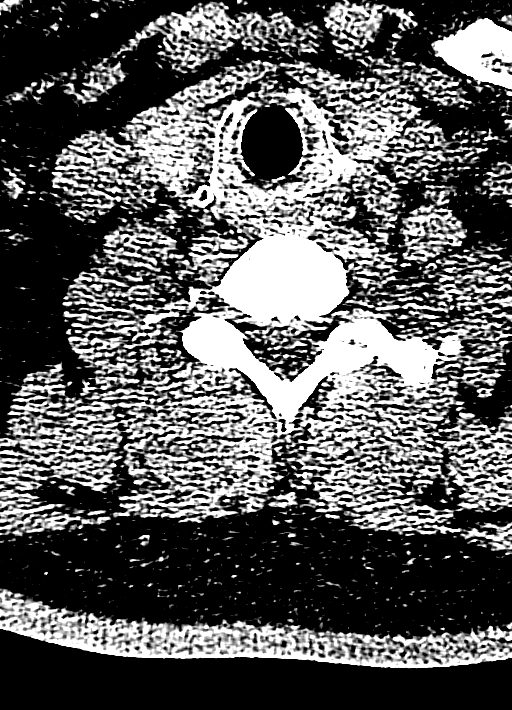
[im 32/90  brain]
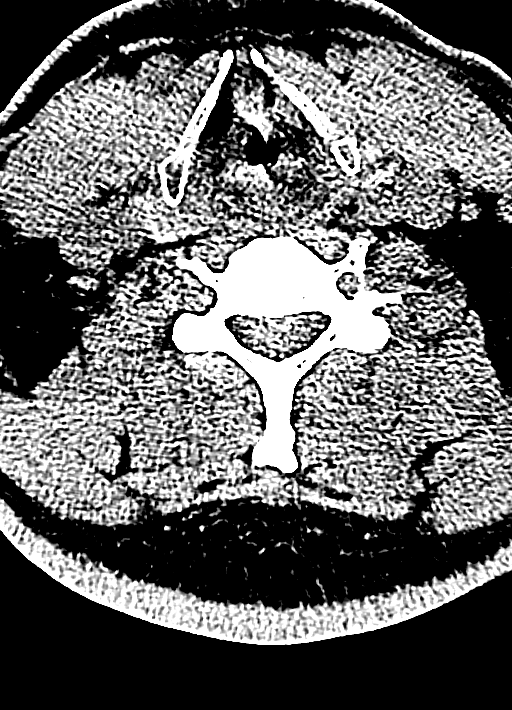
[im 39/90  brain]
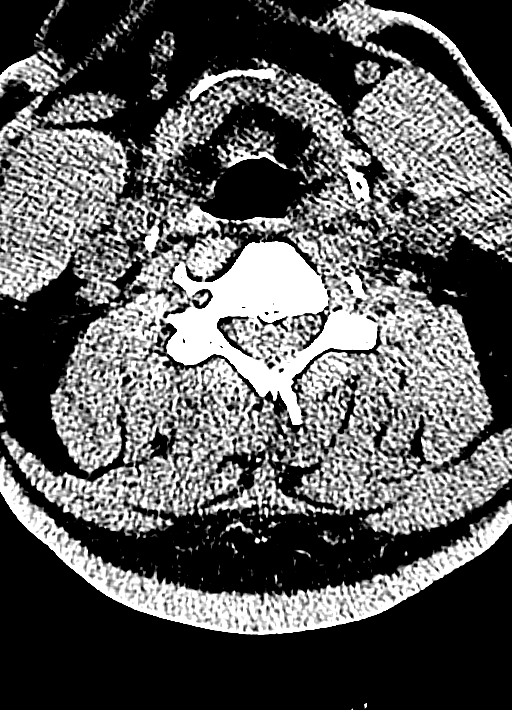
[im 51/90  brain]
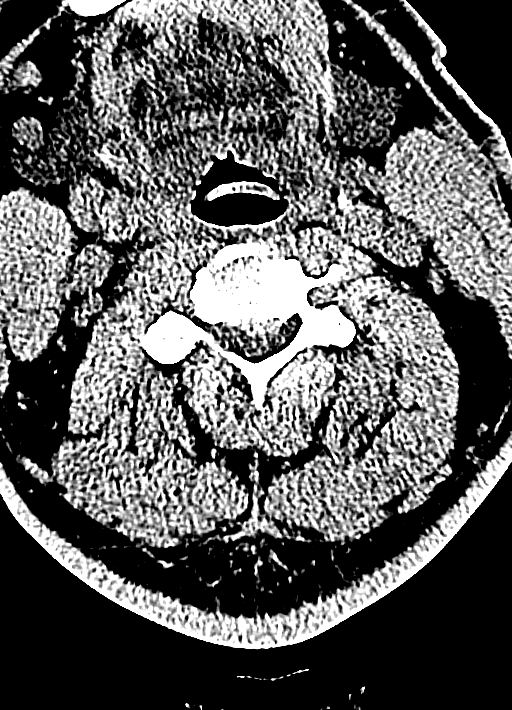
[im 51/90  bone]
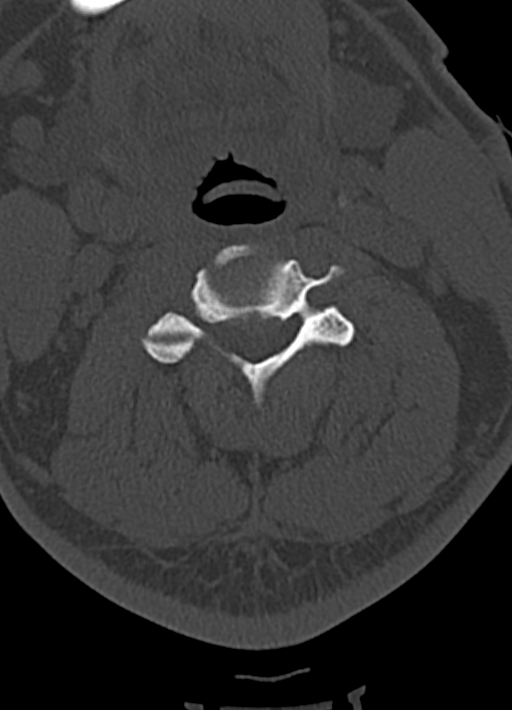
[im 58/90  brain]
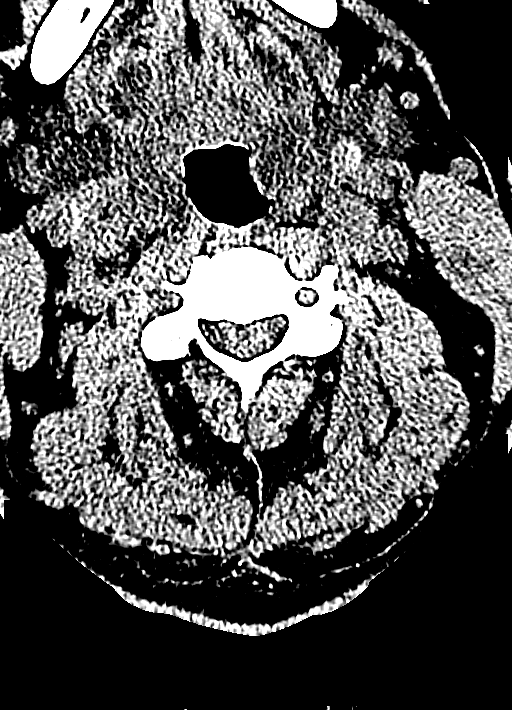
[im 70/90  brain]
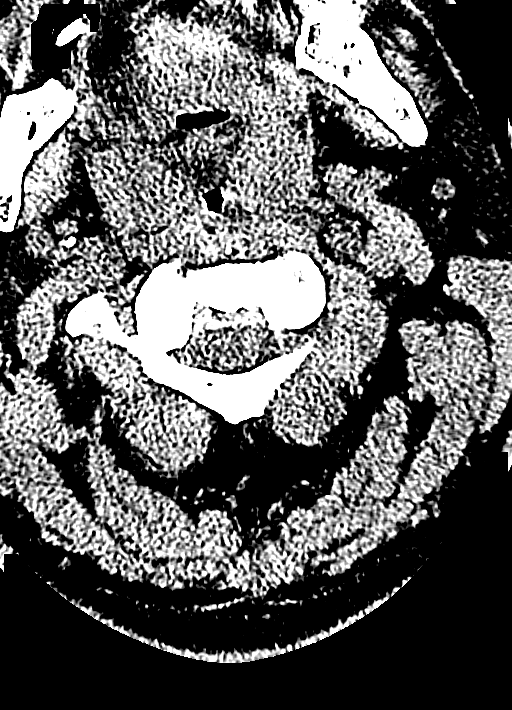
[im 83/90  brain]
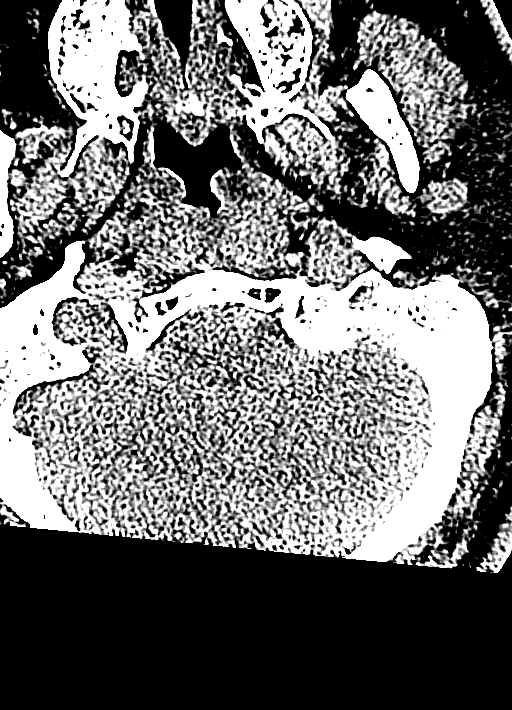

[11 of 47 positions shown; findings below may reference images not displayed]

FINDINGS: CT HEAD FINDINGS

No intracranial hemorrhage. No parenchymal contusion. No midline
shift or mass effect. Basilar cisterns are patent. No skull base
fracture. No fluid in the paranasal sinuses or mastoid air cells.

CT CERVICAL SPINE FINDINGS

No prevertebral soft tissue swelling. Normal alignment of cervical
vertebral bodies. No loss of vertebral body height. Normal facet
articulation. Normal craniocervical junction. No evidence epidural
or paraspinal hematoma.

There is thickening along the anterior longitudinal ligament
posterior to the C4 vertebral body to 3-4 mm. There is a focal bulge
at this level seen on the axial images 45, series 7. This is not
changed from CT of 06/19/2010.
IMPRESSION: 1. No evidence of intracranial trauma.

2.  No cervical spine fracture

3. Disc bulge posterior to the C4 vertebral body is stable compared
to prior.

## 2015-08-08 ENCOUNTER — Ambulatory Visit: Payer: Self-pay | Admitting: Internal Medicine

## 2015-11-17 ENCOUNTER — Emergency Department (HOSPITAL_COMMUNITY)
Admission: EM | Admit: 2015-11-17 | Discharge: 2015-11-17 | Disposition: A | Discharge status: Home / Self Care | Payer: Medicare Other | Attending: Emergency Medicine | Admitting: Emergency Medicine

## 2015-11-17 ENCOUNTER — Encounter (HOSPITAL_COMMUNITY): Payer: Self-pay | Admitting: Emergency Medicine

## 2015-11-17 ENCOUNTER — Emergency Department (HOSPITAL_COMMUNITY)
Admission: EM | Admit: 2015-11-17 | Discharge: 2015-11-17 | Disposition: A | Discharge status: Home / Self Care | Payer: Medicare Other | Source: Home / Self Care | Attending: Emergency Medicine | Admitting: Emergency Medicine

## 2015-11-17 DIAGNOSIS — Z79899 Other long term (current) drug therapy: Secondary | ICD-10-CM | POA: Diagnosis not present

## 2015-11-17 DIAGNOSIS — R42 Dizziness and giddiness: Secondary | ICD-10-CM | POA: Diagnosis present

## 2015-11-17 DIAGNOSIS — Z7901 Long term (current) use of anticoagulants: Secondary | ICD-10-CM | POA: Insufficient documentation

## 2015-11-17 DIAGNOSIS — Z794 Long term (current) use of insulin: Secondary | ICD-10-CM | POA: Insufficient documentation

## 2015-11-17 DIAGNOSIS — Z8673 Personal history of transient ischemic attack (TIA), and cerebral infarction without residual deficits: Secondary | ICD-10-CM

## 2015-11-17 DIAGNOSIS — E119 Type 2 diabetes mellitus without complications: Secondary | ICD-10-CM | POA: Insufficient documentation

## 2015-11-17 DIAGNOSIS — Z87891 Personal history of nicotine dependence: Secondary | ICD-10-CM

## 2015-11-17 DIAGNOSIS — E1165 Type 2 diabetes mellitus with hyperglycemia: Secondary | ICD-10-CM | POA: Insufficient documentation

## 2015-11-17 DIAGNOSIS — Z7982 Long term (current) use of aspirin: Secondary | ICD-10-CM

## 2015-11-17 DIAGNOSIS — I10 Essential (primary) hypertension: Secondary | ICD-10-CM

## 2015-11-17 DIAGNOSIS — R739 Hyperglycemia, unspecified: Secondary | ICD-10-CM

## 2015-11-17 HISTORY — DX: Cerebral infarction, unspecified: I63.9

## 2015-11-17 LAB — CBC
HCT: 41.4 % (ref 39.0–52.0)
Hemoglobin: 14.3 g/dL (ref 13.0–17.0)
MCH: 29.1 pg (ref 26.0–34.0)
MCHC: 34.5 g/dL (ref 30.0–36.0)
MCV: 84.3 fL (ref 78.0–100.0)
Platelets: 315 10*3/uL (ref 150–400)
RBC: 4.91 MIL/uL (ref 4.22–5.81)
RDW: 12.6 % (ref 11.5–15.5)
WBC: 6.4 10*3/uL (ref 4.0–10.5)

## 2015-11-17 LAB — COMPREHENSIVE METABOLIC PANEL
ALT: 33 U/L (ref 17–63)
AST: 21 U/L (ref 15–41)
Albumin: 4.1 g/dL (ref 3.5–5.0)
Alkaline Phosphatase: 80 U/L (ref 38–126)
Anion gap: 11 (ref 5–15)
BUN: 7 mg/dL (ref 6–20)
CO2: 25 mmol/L (ref 22–32)
Calcium: 9.5 mg/dL (ref 8.9–10.3)
Chloride: 103 mmol/L (ref 101–111)
Creatinine, Ser: 0.65 mg/dL (ref 0.61–1.24)
GFR calc Af Amer: >60 mL/min (ref >60)
GFR calc non Af Amer: >60 mL/min (ref >60)
Glucose, Bld: 246 mg/dL — ABNORMAL HIGH (ref 65–99)
Potassium: 4 mmol/L (ref 3.5–5.1)
Sodium: 139 mmol/L (ref 135–145)
Total Bilirubin: 0.5 mg/dL (ref 0.3–1.2)
Total Protein: 8 g/dL (ref 6.5–8.1)

## 2015-11-17 LAB — URINE MICROSCOPIC-ADD ON: Bacteria, UA: NONE SEEN

## 2015-11-17 LAB — CBG MONITORING, ED: GLUCOSE-CAPILLARY: 206 mg/dL — AB (ref 65–99)

## 2015-11-17 LAB — URINALYSIS, ROUTINE W REFLEX MICROSCOPIC
Bilirubin Urine: NEGATIVE
Bilirubin Urine: NEGATIVE
Glucose, UA: >1000 mg/dL — AB
HGB URINE DIPSTICK: NEGATIVE
Hgb urine dipstick: NEGATIVE
KETONES UR: NEGATIVE mg/dL
Ketones, ur: NEGATIVE mg/dL
Leukocytes, UA: NEGATIVE
Leukocytes, UA: NEGATIVE
Nitrite: NEGATIVE
Nitrite: NEGATIVE
PROTEIN: NEGATIVE mg/dL
Protein, ur: NEGATIVE mg/dL
Specific Gravity, Urine: 1.014 (ref 1.005–1.030)
Specific Gravity, Urine: 1.011 (ref 1.005–1.030)
pH: 6 (ref 5.0–8.0)
pH: 6 (ref 5.0–8.0)

## 2015-11-17 LAB — LIPASE, BLOOD: Lipase: 22 U/L (ref 11–51)

## 2015-11-17 MED ORDER — CLOPIDOGREL BISULFATE 75 MG PO TABS: 75.0000 mg | ORAL_TABLET | Freq: Every day | ORAL | Status: DC

## 2015-11-17 MED ORDER — INSULIN GLARGINE 100 UNIT/ML SOLOSTAR PEN: 20.0000 [IU] | PEN_INJECTOR | Freq: Every day | SUBCUTANEOUS | Status: DC

## 2015-11-17 MED ORDER — ASPIRIN 81 MG PO CHEW: 81.0000 mg | CHEWABLE_TABLET | Freq: Every day | ORAL | Status: DC

## 2015-11-17 MED ORDER — HYDROCHLOROTHIAZIDE 12.5 MG PO CAPS
25.0000 mg | ORAL_CAPSULE | Freq: Once | ORAL | Status: AC
  Administered 2015-11-17: 25 mg via ORAL
  Filled 2015-11-17: qty 2

## 2015-11-17 MED ORDER — HYDROCHLOROTHIAZIDE 25 MG PO TABS: 25.0000 mg | ORAL_TABLET | Freq: Every day | ORAL | Status: DC

## 2015-11-17 MED ORDER — MECLIZINE HCL 12.5 MG PO TABS: 25.0000 mg | ORAL_TABLET | Freq: Three times a day (TID) | ORAL | Status: DC | PRN

## 2015-11-17 MED ORDER — DIAZEPAM 5 MG/ML IJ SOLN
2.5000 mg | Freq: Once | INTRAMUSCULAR | Status: AC
  Administered 2015-11-17: 2.5 mg via INTRAVENOUS
  Filled 2015-11-17: qty 2

## 2015-11-17 MED ORDER — MECLIZINE HCL 25 MG PO TABS
12.5000 mg | ORAL_TABLET | Freq: Once | ORAL | Status: AC
Start: 2015-11-17 — End: 2015-11-17
  Administered 2015-11-17: 12.5 mg via ORAL
  Filled 2015-11-17: qty 1

## 2015-11-17 MED ORDER — AMLODIPINE BESYLATE 5 MG PO TABS
5.0000 mg | ORAL_TABLET | Freq: Once | ORAL | Status: AC
  Administered 2015-11-17: 5 mg via ORAL
  Filled 2015-11-17: qty 1

## 2015-11-17 MED ORDER — INSULIN ASPART 100 UNIT/ML FLEXPEN: 4.0000 [IU] | PEN_INJECTOR | Freq: Three times a day (TID) | SUBCUTANEOUS | Status: DC

## 2015-11-17 MED ORDER — CARBAMIDE PEROXIDE 6.5 % OT SOLN: 5.0000 [drp] | Freq: Two times a day (BID) | OTIC | Status: DC

## 2015-11-17 MED ORDER — ATORVASTATIN CALCIUM 20 MG PO TABS: 20.0000 mg | ORAL_TABLET | Freq: Every day | ORAL | Status: DC

## 2015-11-17 NOTE — ED Notes (Signed)
PT DISCHARGED. INSTRUCTIONS AND PRESCRIPTIONS GIVEN. AAOX3. PT IN NO APPARENT DISTRESS OR PAIN. THE OPPORTUNITY TO ASK QUESTIONS WAS PROVIDED. 

## 2015-11-17 NOTE — Discharge Instructions (Signed)
Benign Positional Vertigo Vertigo is the feeling that you or your surroundings are moving when they are not. Benign positional vertigo is the most common form of vertigo. The cause of this condition is not serious (is benign). This condition is triggered by certain movements and positions (is positional). This condition can be dangerous if it occurs while you are doing something that could endanger you or others, such as driving.  CAUSES In many cases, the cause of this condition is not known. It may be caused by a disturbance in an area of the inner ear that helps your brain to sense movement and balance. This disturbance can be caused by a viral infection (labyrinthitis), head injury, or repetitive motion. RISK FACTORS This condition is more likely to develop in:  Women.  People who are 50 years of age or older. SYMPTOMS Symptoms of this condition usually happen when you move your head or your eyes in different directions. Symptoms may start suddenly, and they usually last for less than a minute. Symptoms may include:  Loss of balance and falling.  Feeling like you are spinning or moving.  Feeling like your surroundings are spinning or moving.  Nausea and vomiting.  Blurred vision.  Dizziness.  Involuntary eye movement (nystagmus). Symptoms can be mild and cause only slight annoyance, or they can be severe and interfere with daily life. Episodes of benign positional vertigo may return (recur) over time, and they may be triggered by certain movements. Symptoms may improve over time. DIAGNOSIS This condition is usually diagnosed by medical history and a physical exam of the head, neck, and ears. You may be referred to a health care provider who specializes in ear, nose, and throat (ENT) problems (otolaryngologist) or a provider who specializes in disorders of the nervous system (neurologist). You may have additional testing, including:  MRI.  A CT scan.  Eye movement tests. Your  health care provider may ask you to change positions quickly while he or she watches you for symptoms of benign positional vertigo, such as nystagmus. Eye movement may be tested with an electronystagmogram (ENG), caloric stimulation, the Dix-Hallpike test, or the roll test.  An electroencephalogram (EEG). This records electrical activity in your brain.  Hearing tests. TREATMENT Usually, your health care provider will treat this by moving your head in specific positions to adjust your inner ear back to normal. Surgery may be needed in severe cases, but this is rare. In some cases, benign positional vertigo may resolve on its own in 2-4 weeks. HOME CARE INSTRUCTIONS Safety  Move slowly.Avoid sudden body or head movements.  Avoid driving.  Avoid operating heavy machinery.  Avoid doing any tasks that would be dangerous to you or others if a vertigo episode would occur.  If you have trouble walking or keeping your balance, try using a cane for stability. If you feel dizzy or unstable, sit down right away.  Return to your normal activities as told by your health care provider. Ask your health care provider what activities are safe for you. General Instructions  Take over-the-counter and prescription medicines only as told by your health care provider.  Avoid certain positions or movements as told by your health care provider.  Drink enough fluid to keep your urine clear or pale yellow.  Keep all follow-up visits as told by your health care provider. This is important. SEEK MEDICAL CARE IF:  You have a fever.  Your condition gets worse or you develop new symptoms.  Your family or friends   notice any behavioral changes.  Your nausea or vomiting gets worse.  You have numbness or a "pins and needles" sensation. SEEK IMMEDIATE MEDICAL CARE IF:  You have difficulty speaking or moving.  You are always dizzy.  You faint.  You develop severe headaches.  You have weakness in your  legs or arms.  You have changes in your hearing or vision.  You develop a stiff neck.  You develop sensitivity to light.   This information is not intended to replace advice given to you by your health care provider. Make sure you discuss any questions you have with your health care provider.   Document Released: 04/15/2006 Document Revised: 03/29/2015 Document Reviewed: 10/31/2014 [employer] Interactive Patient Education 2016 [employer]  

## 2015-11-17 NOTE — Discharge Instructions (Signed)
We prescribed you all the medicine that you should be on - REFILL THE MEDICATION YOU HAVE RUN OUT OF.  We dont see any history of being on blood pressure pills - so we are sending you home with a low dose new medicine called HYDROCHLOROTHIAZIDE. You will have to see your doctor for [facility] care of the BP.   Hyperglycemia Hyperglycemia occurs when the glucose (sugar) in your blood is too high. Hyperglycemia can happen for many reasons, but it most often happens to people who do not know they have diabetes or are not managing their diabetes properly.  CAUSES  Whether you have diabetes or not, there are other causes of hyperglycemia. Hyperglycemia can occur when you have diabetes, but it can also occur in other situations that you might not be as aware of, such as: Diabetes  If you have diabetes and are having problems controlling your blood glucose, hyperglycemia could occur because of some of the following reasons:  Not following your meal plan.  Not taking your diabetes medications or not taking it properly.  Exercising less or doing less activity than you normally do.  Being sick. Pre-diabetes  This cannot be ignored. Before people develop Type 2 diabetes, they almost always have "pre-diabetes." This is when your blood glucose levels are higher than normal, but not yet high enough to be diagnosed as diabetes. Research has shown that some long-term damage to the body, especially the heart and circulatory system, may already be occurring during pre-diabetes. If you take action to manage your blood glucose when you have pre-diabetes, you may delay or prevent Type 2 diabetes from developing. Stress  If you have diabetes, you may be "diet" controlled or on oral medications or insulin to control your diabetes. However, you may find that your blood glucose is higher than usual in the hospital whether you have diabetes or not. This is often referred to as "stress hyperglycemia." Stress can elevate  your blood glucose. This happens because of hormones put out by the body during times of stress. If stress has been the cause of your high blood glucose, it can be followed regularly by your caregiver. That way he/she can make sure your hyperglycemia does not continue to get worse or progress to diabetes. Steroids  Steroids are medications that act on the infection fighting system (immune system) to block inflammation or infection. One side effect can be a rise in blood glucose. Most people can produce enough extra insulin to allow for this rise, but for those who cannot, steroids make blood glucose levels go even higher. It is not unusual for steroid treatments to "uncover" diabetes that is developing. It is not always possible to determine if the hyperglycemia will go away after the steroids are stopped. A special blood test called an A1c is sometimes done to determine if your blood glucose was elevated before the steroids were started. SYMPTOMS  Thirsty.  Frequent urination.  Dry mouth.  Blurred vision.  Tired or fatigue.  Weakness.  Sleepy.  Tingling in feet or leg. DIAGNOSIS  Diagnosis is made by monitoring blood glucose in one or all of the following ways:  A1c test. This is a chemical found in your blood.  Fingerstick blood glucose monitoring.  Laboratory results. TREATMENT  First, knowing the cause of the hyperglycemia is important before the hyperglycemia can be treated. Treatment may include, but is not be limited to:  Education.  Change or adjustment in medications.  Change or adjustment in meal plan.  Treatment for an illness, infection, etc.  More frequent blood glucose monitoring.  Change in exercise plan.  Decreasing or stopping steroids.  Lifestyle changes. HOME CARE INSTRUCTIONS   Test your blood glucose as directed.  Exercise regularly. Your caregiver will give you instructions about exercise. Pre-diabetes or diabetes which comes on with stress is  helped by exercising.  Eat wholesome, balanced meals. Eat often and at regular, fixed times. Your caregiver or nutritionist will give you a meal plan to guide your sugar intake.  Being at an ideal weight is important. If needed, losing as little as 10 to 15 pounds may help improve blood glucose levels. SEEK MEDICAL CARE IF:   You have questions about medicine, activity, or diet.  You continue to have symptoms (problems such as increased thirst, urination, or weight gain). SEEK IMMEDIATE MEDICAL CARE IF:   You are vomiting or have diarrhea.  Your breath smells fruity.  You are breathing faster or slower.  You are very sleepy or incoherent.  You have numbness, tingling, or pain in your feet or hands.  You have chest pain.  Your symptoms get worse even though you have been following your caregiver's orders.  If you have any other questions or concerns.   This information is not intended to replace advice given to you by your health care provider. Make sure you discuss any questions you have with your health care provider.   Document Released: 01/01/2001 Document Revised: 09/30/2011 Document Reviewed: 03/14/2015 Elsevier Interactive Patient Education 2016 [employer].  Hypertension Hypertension, commonly called high blood pressure, is when the force of blood pumping through your arteries is too strong. Your arteries are the blood vessels that carry blood from your heart throughout your body. A blood pressure reading consists of a higher number over a lower number, such as 110/72. The higher number (systolic) is the pressure inside your arteries when your heart pumps. The lower number (diastolic) is the pressure inside your arteries when your heart relaxes. Ideally you want your blood pressure below 120/80. Hypertension forces your heart to work harder to pump blood. Your arteries may become narrow or stiff. Having untreated or uncontrolled hypertension can cause heart attack, stroke,  kidney disease, and other problems. RISK FACTORS Some risk factors for high blood pressure are controllable. Others are not.  Risk factors you cannot control include:   Race. You may be at higher risk if you are African American.  Age. Risk increases with age.  Gender. Men are at higher risk than women before age 20 years. After age 36, women are at higher risk than men. Risk factors you can control include:  Not getting enough exercise or physical activity.  Being overweight.  Getting too much fat, sugar, calories, or salt in your diet.  Drinking too much alcohol. SIGNS AND SYMPTOMS Hypertension does not usually cause signs or symptoms. Extremely high blood pressure (hypertensive crisis) may cause headache, anxiety, shortness of breath, and nosebleed. DIAGNOSIS To check if you have hypertension, your health care provider will measure your blood pressure while you are seated, with your arm held at the level of your heart. It should be measured at least twice using the same arm. Certain conditions can cause a difference in blood pressure between your right and left arms. A blood pressure reading that is higher than normal on one occasion does not mean that you need treatment. If it is not clear whether you have high blood pressure, you may be asked to return on a  different day to have your blood pressure checked again. Or, you may be asked to monitor your blood pressure at home for 1 or more weeks. TREATMENT Treating high blood pressure includes making lifestyle changes and possibly taking medicine. Living a healthy lifestyle can help lower high blood pressure. You may need to change some of your habits. Lifestyle changes may include:  Following the DASH diet. This diet is high in fruits, vegetables, and whole grains. It is low in salt, red meat, and added sugars.  Keep your sodium intake below 2,300 mg per day.  Getting at least 30-45 minutes of aerobic exercise at least 4 times per  week.  Losing weight if necessary.  Not smoking.  Limiting alcoholic beverages.  Learning ways to reduce stress. Your health care provider may prescribe medicine if lifestyle changes are not enough to get your blood pressure under control, and if one of the following is true:  You are 72-81 years of age and your systolic blood pressure is above 140.  You are 42 years of age or older, and your systolic blood pressure is above 150.  Your diastolic blood pressure is above 90.  You have diabetes, and your systolic blood pressure is over [id number] or your diastolic blood pressure is over 90.  You have kidney disease and your blood pressure is above 140/90.  You have heart disease and your blood pressure is above 140/90. Your personal target blood pressure may vary depending on your medical conditions, your age, and other factors. HOME CARE INSTRUCTIONS  Have your blood pressure rechecked as directed by your health care provider.   Take medicines only as directed by your health care provider. Follow the directions carefully. Blood pressure medicines must be taken as prescribed. The medicine does not work as well when you skip doses. Skipping doses also puts you at risk for problems.  Do not smoke.   Monitor your blood pressure at home as directed by your health care provider. SEEK MEDICAL CARE IF:   You think you are having a reaction to medicines taken.  You have recurrent headaches or feel dizzy.  You have swelling in your ankles.  You have trouble with your vision. SEEK IMMEDIATE MEDICAL CARE IF:  You develop a severe headache or confusion.  You have unusual weakness, numbness, or feel faint.  You have severe chest or abdominal pain.  You vomit repeatedly.  You have trouble breathing. MAKE SURE YOU:   Understand these instructions.  Will watch your condition.  Will get help right away if you are not doing well or get worse.   This information is not intended to  replace advice given to you by your health care provider. Make sure you discuss any questions you have with your health care provider.   Document Released: 07/08/2005 Document Revised: 11/22/2014 Document Reviewed: 04/30/2013 Elsevier Interactive Patient Education [employer].

## 2015-11-17 NOTE — ED Notes (Signed)
MD at bedside. 

## 2015-11-17 NOTE — ED Provider Notes (Signed)
CSN: [id number]     Arrival date & time 11/17/15  0433 History   First MD Initiated Contact with Patient 11/17/15 0631     Chief Complaint  Patient presents with  . Hypertension  . Hyperglycemia     (Consider location/radiation/quality/duration/timing/severity/associated sxs/prior Treatment) HPI Comments: Pt comes in with cc of elevated BP and elevated blood sugar. Pt reports that he has been out of several home meds for the past month, and decided to come to the ER as he was feeling weak and dizzy. Dizziness is present only when he gets up. He has no spinning sensation. Pt has hx of HTN, DM, Stroke with R sided deficit and denies any new numbness, tingling, weakness, vision changes, confusion, seizures. Pt has no chest pain, dib with the BP. Pt is not sure what medicine he takes for BP.   ROS 10 Systems reviewed and are negative for acute change except as noted in the HPI.     Patient is a 55 y.o. male presenting with hypertension and hyperglycemia. The history is provided by the patient.  Hypertension  Hyperglycemia   Past Medical History  Diagnosis Date  . Diabetes mellitus   . Hypertension   . Stroke [facility])    Past Surgical History  Procedure Laterality Date  . Tee without cardioversion N/A 05/03/2014    Procedure: TRANSESOPHAGEAL ECHOCARDIOGRAM (TEE);  Surgeon: Adam Casino, MD;  Location: [facility] ENDOSCOPY;  Service: Cardiovascular;  Laterality: N/A;   Family History  Problem Relation Age of Onset  . Cervical cancer Mother   . Hypertension Sister   . Diabetes Mellitus II Maternal Uncle    Social History  Substance Use Topics  . Smoking status: Former Research scientist (life sciences)  . Smokeless tobacco: Never Used  . Alcohol Use: No    Review of Systems    Allergies  Review of patient's allergies indicates no known allergies.  Home Medications   Prior to Admission medications   Medication Sig Start Date End Date Taking? Authorizing [place]  Multiple Vitamin (MULTIVITAMIN  WITH MINERALS) TABS tablet Take 1 tablet by mouth daily.   Yes Adam Provider, MD  aspirin 81 MG chewable tablet Chew 1 tablet (81 mg total) by mouth daily. 11/17/15   Adam Biles, MD  atorvastatin (LIPITOR) 20 MG tablet Take 1 tablet (20 mg total) by mouth daily at 6 PM. 11/17/15   Adam Biles, MD  clopidogrel (PLAVIX) 75 MG tablet Take 1 tablet (75 mg total) by mouth daily with breakfast. 11/17/15   Adam Biles, MD  hydrochlorothiazide (HYDRODIURIL) 25 MG tablet Take 1 tablet (25 mg total) by mouth daily. 11/17/15   Adam Biles, MD  insulin aspart (NOVOLOG FLEXPEN) 100 UNIT/ML FlexPen Inject 4 Units into the skin 3 (three) times daily with meals. 11/17/15   Adam Biles, MD  Insulin Glargine (LANTUS) 100 UNIT/ML Solostar Pen Inject 20 Units into the skin daily at 10 pm. 11/17/15   Adam Weyland, MD   BP 164/106 mmHg  Pulse 89  Temp(Src) 97.5 F (36.4 C) (Oral)  Resp 17  Ht 5\' 6"  (1.676 m)  Wt 180 lb (81.647 kg)  BMI 29.07 kg/m2  SpO2 96% Physical Exam  Constitutional: He is oriented to person, place, and time. He appears well-developed.  HENT:  Head: Atraumatic.  Neck: Neck supple.  Cardiovascular: Normal rate.   Pulmonary/Chest: Effort normal.  Neurological: He is alert and oriented to person, place, and time.  R sided weakness, numbness  Skin: Skin is warm.  Nursing note and vitals  reviewed.   ED Course  Procedures (including critical care time) Labs Review Labs Reviewed  URINALYSIS, ROUTINE W REFLEX MICROSCOPIC (NOT AT [facility]) - Abnormal; Notable for the following:    Glucose, UA >1000 (*)    All other components within normal limits  URINE MICROSCOPIC-ADD ON - Abnormal; Notable for the following:    Squamous Epithelial / LPF 0-5 (*)    Bacteria, UA RARE (*)    All other components within normal limits  CBG MONITORING, ED - Abnormal; Notable for the following:    Glucose-Capillary 206 (*)    All other components within normal limits    Imaging Review No  results found. I have personally reviewed and evaluated these images and lab results as part of my medical decision-making.   EKG Interpretation None      MDM   Final diagnoses:  Essential hypertension  Hyperglycemia without ketosis    Pt with cc of med refill. CBG is slightly high - he has no infection like symptoms, no DKA like sympotoms and UA is showing no ketones/infection.  BP is elevated - but no chest pain, dib either.  WE will refill all his home meds. He states that his uncle will help him get his meds.  Pt comfortable going home, he is working on home assistance.  We will start him on HCTZ. Chart review shows no BP meds, pt unsure what he takes or if he is on any BP meds.      Adam Biles, MD 11/17/15 [id number]

## 2015-11-17 NOTE — ED Notes (Addendum)
Pt reporting dizziness that started yesterday.Currently alert and oriented x4.  Weakness to right side of body is baseline for pt after having stroke last year. Pt reports being out of medications for hypertension and diabetes for the last month and lives alone. Closest relative is sister 3 hours away. Pt states that he has been trying to get enrolled in assistive services to help with medications and daily living.

## 2015-11-17 NOTE — ED Notes (Signed)
Per EMS pt discharged this morning; diagnosed with essential hypertension and hyperglycemia without ketosis. Pt reports new complaint headache, abdominal pain, and dizziness immediately post discharge. Pt has not taken blood pressure medications today.

## 2015-11-17 NOTE — ED Notes (Signed)
Pt's ride arrived 

## 2015-11-17 NOTE — ED Notes (Signed)
Per EMS pt has been having elevated blood glucose levels and has not taken blood pressure medications in over a week. Pt reports taking 18 units of Lantus appx 18min prior to EMS arrival. Pt reports feeling more sluggish in the last day. A&OX4 per EMS. Right side weakness that is baseline for pt due to prior stroke.

## 2015-11-17 NOTE — ED Notes (Signed)
Bed: RESA Expected date:  Expected time:  Means of arrival:  Comments: hyperglycemia  

## 2015-11-17 NOTE — ED Notes (Signed)
Pt calling family. Had spoke with sister previously

## 2015-11-27 NOTE — ED Provider Notes (Signed)
CSN: [id number]     Arrival date & time 11/17/15  1617 History   First MD Initiated Contact with Patient 11/17/15 1703     Chief Complaint  Patient presents with  . Headache  . Abdominal Pain  . Dizziness     (Consider location/radiation/quality/duration/timing/severity/associated sxs/prior Treatment) HPI   54 year old male with dizziness. Patient was seen in emergency room earlier today. He was discharged the very early morning hours. After getting home he began feeling dizzy. First noticed this as he is trying to get out of bed. He fell backwards onto the bed. Symptoms improved. They returned that we try getting up out of bed again. They again improved with rest. He describes sensation of feeling very off balance and spinning. Denies or vomiting. Denies any acute pain. He has been having buzzing in his left ear for the past several days.  Past Medical History  Diagnosis Date  . Diabetes mellitus   . Hypertension   . Stroke [facility])    Past Surgical History  Procedure Laterality Date  . Tee without cardioversion N/A 05/03/2014    Procedure: TRANSESOPHAGEAL ECHOCARDIOGRAM (TEE);  Surgeon: Pixie Casino, MD;  Location: [facility] ENDOSCOPY;  Service: Cardiovascular;  Laterality: N/A;   Family History  Problem Relation Age of Onset  . Cervical cancer Mother   . Hypertension Sister   . Diabetes Mellitus II Maternal Uncle    Social History  Substance Use Topics  . Smoking status: Former Research scientist (life sciences)  . Smokeless tobacco: Never Used  . Alcohol Use: No    Review of Systems  All systems reviewed and negative, other than as noted in HPI.   Allergies  Review of patient's allergies indicates no known allergies.  Home Medications   Prior to Admission medications   Medication Sig Start Date End Date Taking? Authorizing Provider  aspirin 81 MG chewable tablet Chew 1 tablet (81 mg total) by mouth daily. 11/17/15   Varney Biles, MD  atorvastatin (LIPITOR) 20 MG tablet Take 1 tablet (20 mg  total) by mouth daily at 6 PM. 11/17/15   Varney Biles, MD  carbamide peroxide (DEBROX) 6.5 % otic solution Place 5 drops into the left ear 2 (two) times daily. 11/17/15   Virgel Manifold, MD  clopidogrel (PLAVIX) 75 MG tablet Take 1 tablet (75 mg total) by mouth daily with breakfast. 11/17/15   Varney Biles, MD  hydrochlorothiazide (HYDRODIURIL) 25 MG tablet Take 1 tablet (25 mg total) by mouth daily. 11/17/15   Varney Biles, MD  insulin aspart (NOVOLOG FLEXPEN) 100 UNIT/ML FlexPen Inject 4 Units into the skin 3 (three) times daily with meals. 11/17/15   Varney Biles, MD  Insulin Glargine (LANTUS) 100 UNIT/ML Solostar Pen Inject 20 Units into the skin daily at 10 pm. 11/17/15   Varney Biles, MD  meclizine (ANTIVERT) 12.5 MG tablet Take 2 tablets (25 mg total) by mouth 3 (three) times daily as needed for dizziness. 11/17/15   Virgel Manifold, MD  Multiple Vitamin (MULTIVITAMIN WITH MINERALS) TABS tablet Take 1 tablet by mouth daily.    Historical Provider, MD   BP 143/96 mmHg  Pulse 92  Temp(Src) 97.5 F (36.4 C) (Oral)  Resp 20  SpO2 98% Physical Exam  Constitutional: He is oriented to person, place, and time. He appears well-developed and well-nourished. No distress.  HENT:  Head: Normocephalic and atraumatic.  Cerumen impaction left ear.  Eyes: Conjunctivae are normal. Right eye exhibits no discharge. Left eye exhibits no discharge.  Neck: Neck supple.  Cardiovascular: Normal  rate, regular rhythm and normal heart sounds.  Exam reveals no gallop and no friction rub.   No murmur heard. Pulmonary/Chest: Effort normal and breath sounds normal. No respiratory distress.  Abdominal: Soft. He exhibits no distension. There is no tenderness.  Musculoskeletal: He exhibits no edema or tenderness.  Neurological: He is alert and oriented to person, place, and time. No cranial nerve deficit. He exhibits normal muscle tone. Coordination normal.  Skin: Skin is warm and dry.  Psychiatric: He has a  normal mood and affect. His behavior is normal. Thought content normal.  Nursing note and vitals reviewed.   ED Course  Procedures (including critical care time) Labs Review Labs Reviewed  COMPREHENSIVE METABOLIC PANEL - Abnormal; Notable for the following:    Glucose, Bld 246 (*)    All other components within normal limits  URINALYSIS, ROUTINE W REFLEX MICROSCOPIC (NOT AT [facility]) - Abnormal; Notable for the following:    Glucose, UA >1000 (*)    All other components within normal limits  URINE MICROSCOPIC-ADD ON - Abnormal; Notable for the following:    Squamous Epithelial / LPF 0-5 (*)    All other components within normal limits  LIPASE, BLOOD  CBC    Imaging Review No results found. I have personally reviewed and evaluated these images and lab results as part of my medical decision-making.   EKG Interpretation None      MDM   Final diagnoses:  Vertigo    49y male with symptoms which seem consistent with vertigo. Reports sensation of room spinning. Also associated with tinnitus. Neuro exam is nonfocal. Plan symptomatic treatment. Doubt central etiology.   Virgel Manifold, MD 11/27/15 [id number]

## 2016-03-02 ENCOUNTER — Emergency Department (HOSPITAL_COMMUNITY)
Admission: EM | Admit: 2016-03-02 | Discharge: 2016-03-02 | Disposition: A | Discharge status: Home / Self Care | Payer: Medicare Other | Attending: Emergency Medicine | Admitting: Emergency Medicine

## 2016-03-02 ENCOUNTER — Encounter (HOSPITAL_COMMUNITY): Payer: Self-pay | Admitting: Emergency Medicine

## 2016-03-02 DIAGNOSIS — E119 Type 2 diabetes mellitus without complications: Secondary | ICD-10-CM | POA: Diagnosis not present

## 2016-03-02 DIAGNOSIS — Z7982 Long term (current) use of aspirin: Secondary | ICD-10-CM | POA: Insufficient documentation

## 2016-03-02 DIAGNOSIS — H9312 Tinnitus, left ear: Secondary | ICD-10-CM

## 2016-03-02 DIAGNOSIS — I10 Essential (primary) hypertension: Secondary | ICD-10-CM | POA: Diagnosis not present

## 2016-03-02 DIAGNOSIS — Z87891 Personal history of nicotine dependence: Secondary | ICD-10-CM | POA: Insufficient documentation

## 2016-03-02 DIAGNOSIS — Z7902 Long term (current) use of antithrombotics/antiplatelets: Secondary | ICD-10-CM | POA: Diagnosis not present

## 2016-03-02 DIAGNOSIS — Z8673 Personal history of transient ischemic attack (TIA), and cerebral infarction without residual deficits: Secondary | ICD-10-CM | POA: Insufficient documentation

## 2016-03-02 DIAGNOSIS — Z794 Long term (current) use of insulin: Secondary | ICD-10-CM | POA: Insufficient documentation

## 2016-03-02 DIAGNOSIS — H9202 Otalgia, left ear: Secondary | ICD-10-CM | POA: Diagnosis present

## 2016-03-02 NOTE — ED Triage Notes (Signed)
Pt arrives to A11c at this time via EMS stretcher. Pt reports that he has ringing in his left ear that awakened him from sleep this morning.  EMS reports that pt's bp was elevated at 168/112 with a pulse rate of 98 bpm noted.   Chief Complaint  Patient presents with  . Altered Mental Status    Pt reports ringing in his left ear that awakened him from sleep this morning.  Per EMS pt's bp was elevated.     Past Medical History:  Diagnosis Date  . Diabetes mellitus   . Hypertension   . Stroke [facility])

## 2016-03-02 NOTE — ED Notes (Signed)
Pt provided with d/c instructions at this time.  Pt verbalizes understanding of d/c instructions as well as follow up procedure after d/c.   No new RX at time of d/c. Pt in no apparent distress at this time.  Pt assisted out of the ED via [place] at this time.  Pt states that he will call a cab to get himself home after being d/c'd.

## 2016-03-02 NOTE — ED Provider Notes (Signed)
[facility] DEPT Provider Note   CSN: [id number] Arrival date & time: 03/02/16  [id number]  First Provider Contact:  First MD Initiated Contact with Patient 03/02/16 0522        History   Chief Complaint Chief Complaint  Patient presents with  . Otalgia    Pt reports ringing in his left ear that awakened him from sleep this morning.  Per EMS pt's bp was elevated.      HPI Adam Callahan is a 55 y.o. male.  The history is provided by the patient.  Otalgia  This is a new problem. The current episode started 3 to 5 hours ago. There is pain in the left ear. The problem occurs constantly. The problem has been gradually improving. There has been no fever. The pain is mild. Associated symptoms comments: Tinnitus .   Pt reports he woke up with left ear pain/tinnitus He reports minimal dizziness No new HA No fever/vomiting No new weakness (chronic right sided weakness from previous stroke) No new visual changes He denies neck or back pain  Past Medical History:  Diagnosis Date  . Diabetes mellitus   . Hypertension   . Stroke [facility])     Patient Active Problem List   Diagnosis Date Noted  . Cognitive deficit, post-stroke 11/14/2014  . 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 Homonymous hemianopsia following cerebrovascular accident 06/06/2014  . Acute left hemiparesis ([place]) 05/04/2014  . Stroke ([place]) 04/30/2014  . Spastic hemiplegia affecting dominant side ([place]) 10/01/2013  . Aphasia as late effect of cerebrovascular accident 10/01/2013  . Alterations of sensations, late effect of cerebrovascular disease 10/01/2013  . 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 Dehydration 06/07/2013  . Hypokalemia 06/07/2013  . Dyslipidemia 06/07/2013  . CVA (cerebral infarction) 06/06/2013  . Diabetes mellitus type 2, uncontrolled ([place]) 06/06/2013  . Rhabdomyolysis 06/06/2013    Past Surgical History:  Procedure Laterality Date  .  TEE WITHOUT CARDIOVERSION N/A 05/03/2014   Procedure: TRANSESOPHAGEAL ECHOCARDIOGRAM (TEE);  Surgeon: Pixie Casino, MD;  Location: [facility] ENDOSCOPY;  Service: Cardiovascular;  Laterality: N/A;       Home Medications    Prior to Admission medications   Medication Sig Start Date End Date Taking? Authorizing Provider  amLODipine (NORVASC) 5 MG tablet Take 5 mg by mouth daily. 02/01/16  Yes Historical Provider, MD  aspirin 81 MG chewable tablet Chew 1 tablet (81 mg total) by mouth daily. 11/17/15  Yes Varney Biles, MD  atorvastatin (LIPITOR) 20 MG tablet Take 1 tablet (20 mg total) by mouth daily at 6 PM. 11/17/15  Yes Ankit Nanavati, MD  carbamide peroxide (DEBROX) 6.5 % otic solution Place 5 drops into the left ear 2 (two) times daily. 11/17/15  Yes Virgel Manifold, MD  cloNIDine (CATAPRES) 0.1 MG tablet Take 0.1 mg by mouth 2 (two) times daily. 02/01/16  Yes Historical Provider, MD  clopidogrel (PLAVIX) 75 MG tablet Take 1 tablet (75 mg total) by mouth daily with breakfast. 11/17/15  Yes Ankit Nanavati, MD  insulin aspart (NOVOLOG FLEXPEN) 100 UNIT/ML FlexPen Inject 4 Units into the skin 3 (three) times daily with meals. 11/17/15  Yes Varney Biles, MD  Insulin Glargine (LANTUS) 100 UNIT/ML Solostar Pen Inject 20 Units into the skin daily at 10 pm. 11/17/15  Yes Varney Biles, MD  meclizine (ANTIVERT) 12.5 MG tablet Take 2 tablets (25 mg total) by mouth 3 (three) times daily as needed for dizziness. 11/17/15  Yes Virgel Manifold, MD  Multiple Vitamin (MULTIVITAMIN WITH MINERALS) TABS tablet Take 1 tablet by mouth daily.   Yes Historical Provider, MD  triamterene-hydrochlorothiazide (MAXZIDE-25) 37.5-25 MG tablet Take 1 tablet by mouth daily. 02/01/16  Yes Historical Provider, MD  hydrochlorothiazide (HYDRODIURIL) 25 MG tablet Take 1 tablet (25 mg total) by mouth daily. Patient not taking: Reported on 03/02/2016 11/17/15   Varney Biles, MD    Family History Family History  Problem Relation Age of  Onset  . Cervical cancer Mother   . Hypertension Sister   . Diabetes Mellitus II Maternal Uncle     Social History Social History  Substance Use Topics  . Smoking status: Former Research scientist (life sciences)  . Smokeless tobacco: Never Used  . Alcohol use No     Allergies   Review of patient's allergies indicates no known allergies.   Review of Systems Review of Systems  Constitutional: Negative for fever.  HENT: Positive for ear pain.   Eyes: Negative for visual disturbance.  All other systems reviewed and are negative.    Physical Exam Updated Vital Signs BP 123/88   Pulse 92   Temp 97.6 F (36.4 C) (Oral)   Resp 18   Ht 5\' 6"  (1.676 m)   Wt 79.4 kg   SpO2 100%   BMI 28.25 kg/m   Physical Exam  CONSTITUTIONAL: Well developed/well nourished HEAD: Normocephalic/atraumatic EYES: EOMI/PERRL ENMT: Mucous membranes moist, cerumen impaction right ear, left TM clear/intact, no foreign bodies noted, ears symmetric, no mastoid tenderness NECK: supple no meningeal signs SPINE/BACK:entire spine nontender CV: S1/S2 note LUNGS: Lungs are clear to auscultation bilaterally, no apparent distress ABDOMEN: soft, nontender NEURO: Pt is awake/alert/appropriate,chronic right arm weakness noted. Minimal right facial droop noted (chronic)  SKIN: warm, color normal PSYCH: no abnormalities of mood noted, alert and oriented to situation  ED Treatments / Results  Labs (all labs ordered are listed, but only abnormal results are displayed) Labs Reviewed - No data to display  EKG  EKG Interpretation None       Radiology No results found.  Procedures Procedures (including critical care time)  Medications Ordered in ED Medications - No data to display   Initial Impression / Assessment and Plan / ED Course  I have reviewed the triage vital signs and the nursing notes.    Clinical Course    Pt here for ear pain and tinnitus He reports its improving by the time of my evaluation He  denies any new weakness I doubt acute neurologic emergency at this time He has been evaluated for this previously Patient is appropriate for d/c home He is agreeable with this plan   Final Clinical Impressions(s) / ED Diagnoses   Final diagnoses:  Tinnitus, left    New Prescriptions New Prescriptions   No medications on file     Ripley Fraise, MD 03/02/16 [phone]

## 2016-03-04 DIAGNOSIS — H903 Sensorineural hearing loss, bilateral: Secondary | ICD-10-CM | POA: Insufficient documentation

## 2017-04-22 ENCOUNTER — Encounter: Payer: Self-pay | Admitting: Gastroenterology

## 2017-05-21 ENCOUNTER — Encounter: Payer: Self-pay | Admitting: Physician Assistant

## 2017-06-03 ENCOUNTER — Encounter (INDEPENDENT_AMBULATORY_CARE_PROVIDER_SITE_OTHER): Payer: Self-pay

## 2017-06-03 ENCOUNTER — Encounter: Payer: Self-pay | Admitting: Physician Assistant

## 2017-06-03 ENCOUNTER — Ambulatory Visit (INDEPENDENT_AMBULATORY_CARE_PROVIDER_SITE_OTHER): Payer: Medicare Other | Admitting: Physician Assistant

## 2017-06-03 ENCOUNTER — Telehealth: Payer: Self-pay | Admitting: Emergency Medicine

## 2017-06-03 VITALS — BP 118/74 | HR 80 | Ht 66.0 in | Wt 184.4 lb

## 2017-06-03 DIAGNOSIS — Z7901 Long term (current) use of anticoagulants: Secondary | ICD-10-CM | POA: Diagnosis not present

## 2017-06-03 DIAGNOSIS — Z1211 Encounter for screening for malignant neoplasm of colon: Secondary | ICD-10-CM

## 2017-06-03 DIAGNOSIS — Z1212 Encounter for screening for malignant neoplasm of rectum: Secondary | ICD-10-CM | POA: Diagnosis not present

## 2017-06-03 MED ORDER — NA SULFATE-K SULFATE-MG SULF 17.5-3.13-1.6 GM/177ML PO SOLN: 1.0000 | ORAL | 0 refills | Status: DC

## 2017-06-03 NOTE — Telephone Encounter (Signed)
   Adam Callahan June 13, 1961 [id number]   We have scheduled the above named patient for a colonoscopy procedure. Our records show that he is on anticoagulation therapy.  Please advise as to whether the patient may come off their therapy of Plavix 5 days prior to their procedure which is scheduled for 07-29-17.  Please route your response to Tinnie Gens, CMA.  Sincerely,    [facility]

## 2017-06-03 NOTE — Patient Instructions (Signed)

## 2017-06-03 NOTE — Progress Notes (Addendum)
Chief Complaint:  Consult for Screening Colonoscopy  HPI:     Adam Callahan is a 56 year old AA male with a past medical history as listed below including stroke maintained on Plavix, who was referred to me by Javier Docker, MD for consult for screening colonoscopy.      Today, the patient presents to clinic accompanied by his aunt who also helps take care of him.  He explains that he wants to get his colonoscopy done for screening reasons.  He is not having any GI complaints today.  Patient describes being placed on Plavix about 5 years ago after a stroke.  He has never had hold this medication before for any procedures.    Patient denies fever, chills, blood in stool, melena, weight loss, fatigue, anorexia, nausea, vomiting, heartburn, reflux, abdominal pain or change in bowel habits.  Past Medical History:  Diagnosis Date  . Diabetes mellitus   . Hypertension   . Stroke [facility])     No past surgical history on file.  Current Outpatient Medications  Medication Sig Dispense Refill  . amLODipine (NORVASC) 5 MG tablet Take 5 mg by mouth daily.    Marland Kitchen aspirin 81 MG chewable tablet Chew 1 tablet (81 mg total) by mouth daily. 30 tablet 0  . atorvastatin (LIPITOR) 20 MG tablet Take 1 tablet (20 mg total) by mouth daily at 6 PM. 30 tablet 0  . carbamide peroxide (DEBROX) 6.5 % otic solution Place 5 drops into the left ear 2 (two) times daily. 15 mL 0  . cloNIDine (CATAPRES) 0.1 MG tablet Take 0.1 mg by mouth 2 (two) times daily.    . clopidogrel (PLAVIX) 75 MG tablet Take 1 tablet (75 mg total) by mouth daily with breakfast. 30 tablet 0  . hydrochlorothiazide (HYDRODIURIL) 25 MG tablet Take 1 tablet (25 mg total) by mouth daily. (Patient not taking: Reported on 03/02/2016) 30 tablet 0  . insulin aspart (NOVOLOG FLEXPEN) 100 UNIT/ML FlexPen Inject 4 Units into the skin 3 (three) times daily with meals. 15 mL 0  . Insulin Glargine (LANTUS) 100 UNIT/ML Solostar Pen Inject 20 Units into the skin  daily at 10 pm. 15 mL 0  . meclizine (ANTIVERT) 12.5 MG tablet Take 2 tablets (25 mg total) by mouth 3 (three) times daily as needed for dizziness. 30 tablet 0  . Multiple Vitamin (MULTIVITAMIN WITH MINERALS) TABS tablet Take 1 tablet by mouth daily.    Marland Kitchen triamterene-hydrochlorothiazide (MAXZIDE-25) 37.5-25 MG tablet Take 1 tablet by mouth daily.     No current facility-administered medications for this visit.     Allergies as of 06/03/2017  . (No Known Allergies)    Family History  Problem Relation Age of Onset  . Cervical cancer Mother   . Hypertension Sister   . Diabetes Mellitus II Maternal Uncle     Social History   Socioeconomic History  . Marital status: Single    Spouse name: Not on file  . Number of children: 2  . Years of education: 41  . Highest education level: Not on file  Social Needs  . Financial resource strain: Not on file  . Food insecurity - worry: Not on file  . Food insecurity - inability: Not on file  . Transportation needs - medical: Not on file  . Transportation needs - non-medical: Not on file  Occupational History  . Not on file  Tobacco Use  . Smoking status: Former Research scientist (life sciences)  . Smokeless tobacco: Never Used  Substance and  Sexual Activity  . Alcohol use: No    Alcohol/week: 0.0 oz  . Drug use: No  . Sexual activity: Not on file  Other Topics Concern  . Not on file  Social History Narrative   Patient is single and has 2 children.   Patient is right handed.   Patient has hs education.   Patient drinks diet caffeine free sodas daily.    Review of Systems:    Constitutional: No weight loss, fever or chills Skin: No rash Cardiovascular: No chest pain Respiratory: No SOB  Gastrointestinal: See HPI and otherwise negative Genitourinary: No dysuria  Neurological: No headache Musculoskeletal: No new muscle or joint pain Hematologic: No bleeding  Psychiatric: No history of depression or anxiety    Physical Exam:  Vital signs: BP 118/74    Pulse 80   Ht 5\' 6"  (1.676 m)   Wt 184 lb 6.4 oz (83.6 kg)   BMI 29.76 kg/m    Constitutional:   Pleasant AA male appears to be in NAD, Well developed, Well nourished, alert and cooperative Head:  Normocephalic and atraumatic. Eyes:   PEERL, EOMI. No icterus. Conjunctiva pink. Ears:  Normal auditory acuity. Neck:  Supple Throat: Oral cavity and pharynx without inflammation, swelling or lesion.  Respiratory: Respirations even and unlabored. Lungs clear to auscultation bilaterally.   No wheezes, crackles, or rhonchi.  Cardiovascular: Normal S1, S2. No MRG. Regular rate and rhythm. No peripheral edema, cyanosis or pallor.  Gastrointestinal:  Soft, nondistended, nontender. No rebound or guarding. Normal bowel sounds. No appreciable masses or hepatomegaly. Rectal:  Not performed.  Msk:  Symmetrical without gross deformities. Without edema, no deformity or joint abnormality. In a wheelchair but able to ambulate by himself with a cane Neurologic:  Alert and  oriented x4;  grossly normal neurologically.  Skin:   Dry and intact without significant lesions or rashes. Psychiatric: Demonstrates good judgement and reason without abnormal affect or behaviors.  No recent labs or imaging.  Assessment: 1.  Screening for colorectal cancer: Patient has never had a colonoscopy, he is overdue at the age of 75 2.  Chronic anticoagulation: On Plavix for a stroke in 2013  Plan: 1.  Scheduled patient for screening colonoscopy in the [place] with Dr. Loletha Carrow.  Did discuss risks, benefits, limitations and alternatives and patient agrees to proceed. 2.  Patient does ambulate with a cane, but tells me that he is able to transfer himself to a bed. 3.  Patient was advised to hold his Plavix for 5 days prior to time of his procedure.  We will communicate with his physician to ensure that holding his Plavix is acceptable. 4.  Patient will follow in clinic with Dr. Loletha Carrow as recommended in the future.  Ellouise Newer,  PA-C [facility] 06/03/2017, 2:07 PM  Cc: Javier Docker, MD   Addendum: 06/04/17 1409 Most recent labs 03/17/17: CMP with a glucose elevated at 198 and otherwise normal.  Hemoglobin A1c of 9.4 and a normal CBC.  Ellouise Newer, PA-C  Thank you for sending this case to me. I have reviewed the entire note, and the outlined plan seems appropriate.   Wilfrid Lund, MD

## 2017-06-03 NOTE — Telephone Encounter (Signed)
Please not - cardiology did not place the pt on Plavix, neurology did after a CVA, you will need to check with Neurology.

## 2017-06-17 ENCOUNTER — Encounter: Payer: Self-pay | Admitting: Gastroenterology

## 2017-07-09 NOTE — Telephone Encounter (Signed)
Left message for patient to see who is prescribing his Plavix and to call back.

## 2017-07-21 ENCOUNTER — Encounter: Payer: Self-pay | Admitting: Emergency Medicine

## 2017-07-21 NOTE — Telephone Encounter (Signed)
Called Dr. Ailene Rud office and faxed clearance letter again. Informed them that procedure is next week and I need to know something ASAP. She will call back to give clearance.

## 2017-07-23 NOTE — Telephone Encounter (Signed)
Called and spoke to Dr. Ailene Rud nurse again and let her know that I need an answer today. She will call me back this afternoon.

## 2017-07-24 NOTE — Telephone Encounter (Signed)
Pts colonoscopy was cancelled due to elevated BP and BS per Dr. Kennon Holter patient is not stable to undergo this procedure right now.

## 2017-07-29 ENCOUNTER — Encounter: Payer: Self-pay | Admitting: Gastroenterology

## 2018-02-17 ENCOUNTER — Ambulatory Visit (INDEPENDENT_AMBULATORY_CARE_PROVIDER_SITE_OTHER): Payer: Medicare Other | Admitting: Physician Assistant

## 2018-02-17 ENCOUNTER — Encounter: Payer: Self-pay | Admitting: Physician Assistant

## 2018-02-17 VITALS — BP 130/80 | HR 82 | Ht 66.0 in | Wt 183.0 lb

## 2018-02-17 DIAGNOSIS — D649 Anemia, unspecified: Secondary | ICD-10-CM | POA: Diagnosis not present

## 2018-02-17 DIAGNOSIS — Z7901 Long term (current) use of anticoagulants: Secondary | ICD-10-CM | POA: Diagnosis not present

## 2018-02-17 NOTE — Progress Notes (Signed)
Chief Complaint: Anemia  HPI:    [name] is a 57 year old African-American male, assigned to Dr. Loletha Carrow at last visit, with a past medical history as listed below including stroke maintained on Plavix, who was referred to me by Adam Docker, MD for a complaint of anemia.      06/03/2017 patient seen in clinic by me for screening colonoscopy.  At that time was set up for colonoscopy but his primary care provider canceled this procedures patient's blood pressure and blood sugar were elevated.    Today, presents to clinic accompanied by his daughter and his aunt who is on the phone, who assist with his history.  Apparently patient has been feeling well but was recently told that he was anemic, no acute health events since being seen in November.  His aunt is requesting that he have MiraLAX prep because she recently underwent colonoscopy and found this easier to do and believes he may be nauseous if he tries something else.  Patient denies seeing any bright red blood in his stool, melena or abdominal pain.    Denies fever, chills, weight loss, anorexia or symptoms that awaken him at night.  Past Medical History:  Diagnosis Date  . Diabetes mellitus   . Hypertension   . Stroke [facility])     Past Surgical History:  Procedure Laterality Date  . TEE WITHOUT CARDIOVERSION N/A 05/03/2014   Procedure: TRANSESOPHAGEAL ECHOCARDIOGRAM (TEE);  Surgeon: Pixie Casino, MD;  Location: [facility] ENDOSCOPY;  Service: Cardiovascular;  Laterality: N/A;    Current Outpatient Medications  Medication Sig Dispense Refill  . amLODipine (NORVASC) 5 MG tablet Take 5 mg by mouth daily.    Marland Kitchen aspirin 81 MG chewable tablet Chew 1 tablet (81 mg total) by mouth daily. 30 tablet 0  . atorvastatin (LIPITOR) 20 MG tablet Take 1 tablet (20 mg total) by mouth daily at 6 PM. 30 tablet 0  . cloNIDine (CATAPRES) 0.1 MG tablet Take 0.1 mg by mouth 2 (two) times daily.    . clopidogrel (PLAVIX) 75 MG tablet Take 1 tablet (75 mg  total) by mouth daily with breakfast. 30 tablet 0  . insulin aspart (NOVOLOG) 100 UNIT/ML injection Inject 8 Units into the skin 3 (three) times daily before meals.    . insulin glargine (LANTUS) 100 UNIT/ML injection Inject 27 Units into the skin daily.    . meloxicam (MOBIC) 15 MG tablet Take 1 tablet by mouth daily.  1   No current facility-administered medications for this visit.     Allergies as of 02/17/2018  . (No Known Allergies)    Family History  Problem Relation Age of Onset  . Cervical cancer Mother   . Hypertension Sister   . Diabetes Mellitus II Maternal Uncle   . Colon cancer Maternal Aunt     Social History   Socioeconomic History  . Marital status: Divorced    Spouse name: Not on file  . Number of children: 2  . Years of education: 64  . Highest education level: Not on file  Occupational History  . Not on file  Social Needs  . Financial resource strain: Not on file  . Food insecurity:    Worry: Not on file    Inability: Not on file  . Transportation needs:    Medical: Not on file    Non-medical: Not on file  Tobacco Use  . Smoking status: Never Smoker  . Smokeless tobacco: Never Used  Substance and Sexual Activity  .  Alcohol use: No    Alcohol/week: 0.0 oz  . Drug use: No  . Sexual activity: Not on file  Lifestyle  . Physical activity:    Days per week: Not on file    Minutes per session: Not on file  . Stress: Not on file  Relationships  . Social connections:    Talks on phone: Not on file    Gets together: Not on file    Attends religious service: Not on file    Active member of club or organization: Not on file    Attends meetings of clubs or organizations: Not on file    Relationship status: Not on file  . Intimate partner violence:    Fear of current or ex partner: Not on file    Emotionally abused: Not on file    Physically abused: Not on file    Forced sexual activity: Not on file  Other Topics Concern  . Not on file  Social  History Narrative   Patient is single and has 2 children.   Patient is right handed.   Patient has hs education.   Patient drinks diet caffeine free sodas daily.    Review of Systems:    Constitutional: No weight loss, fever or chills Cardiovascular: No chest pain Respiratory: No SOB  Gastrointestinal: See HPI and otherwise negative   Physical Exam:  Vital signs: BP 130/80   Pulse 82   Ht 5\' 6"  (1.676 m)   Wt 183 lb (83 kg)   BMI 29.54 kg/m   Constitutional:   Pleasant AA male appears to be in NAD, Well developed, Well nourished, alert and cooperative Respiratory: Respirations even and unlabored. Lungs clear to auscultation bilaterally.   No wheezes, crackles, or rhonchi.  Cardiovascular: Normal S1, S2. No MRG. Regular rate and rhythm. No peripheral edema, cyanosis or pallor.  Gastrointestinal:  Soft, nondistended, nontender. No rebound or guarding. Normal bowel sounds. No appreciable masses or hepatomegaly. Rectal:  Not performed.  Msk:  Symmetrical without gross deformities. Without edema, no deformity or joint abnormality. +ambulates with cane Neurologic:  Alert and  oriented x4;  +right sided defects Psychiatric: Demonstrates good judgement and reason without abnormal affect or behaviors.  Requesting recent labs.  Assessment: 1.  Anemia: Per patient report and per consult note received, but labs were not received today at time of visit; consider GI source of blood loss versus other 2.  Chronic anticoagulation: With Plavix for stroke  Plan: 1.  Went ahead and scheduled patient for an EGD and colonoscopy in the [place] with Dr. Loletha Carrow.  Did discuss risk, benefits, limitations and alternatives and the patient agrees to proceed.  Discussed risks of stopping anticoagulation including but not limited to stroke. 2.  Recommend the patient hold his Plavix for 5 days prior to time procedure.  We will consult with his PCP to ensure that holding his Plavix is acceptable for him. 3.   Requested recent labs done by PCP illustrating anemia. 4.  Gave patient MiraLAX prep per his request. 5.  Patient to follow in clinic per recommendations from Dr. Loletha Carrow after time of procedures.  Ellouise Newer, PA-C [facility] 02/17/2018, 11:35 AM  Cc: Adam Docker, MD

## 2018-02-17 NOTE — Progress Notes (Signed)
Thank you for sending this case to me. I have reviewed the entire note, and the outlined plan seems appropriate.  Please cope me on your review the primary care labs so we know if this is truly iron deficiency and that EGD indicated.  Wilfrid Lund, MD

## 2018-02-17 NOTE — Patient Instructions (Signed)
You have been scheduled for an endoscopy and colonoscopy. Please follow the written instructions given to you at your visit today. Please pick up your prep supplies at the pharmacy within the next 1-3 days. If you use inhalers (even only as needed), please bring them with you on the day of your procedure. Your physician has requested that you go to www.startemmi.com and enter the access code given to you at your visit today. This web site gives a general overview about your procedure. However, you should still follow specific instructions given to you by our office regarding your preparation for the procedure.  You will be contacted by our office prior to your procedure for directions on holding your Plavix.  If you do not hear from our office 1 week prior to your scheduled procedure, please call [phone] to discuss.

## 2018-02-18 ENCOUNTER — Telehealth: Payer: Self-pay | Admitting: Emergency Medicine

## 2018-02-18 NOTE — Telephone Encounter (Signed)
   Adam Callahan 01/11/1961 [id number]  Dear Dr. Eula Fried:  We have scheduled the above named patient for a colonoscopy procedure. Our records show that he is on anticoagulation therapy.  Please advise as to whether the patient may come off their therapy of plavix 5 days prior to their procedure which is scheduled for 04-15-18.  Please route your response to Tinnie Gens, CMA or fax response to [phone].  Sincerely,    [facility]

## 2018-04-13 NOTE — Telephone Encounter (Signed)
Spoke to patients aunt and informed her that we have made multiple attempts but have not been able to reach patients PCP to see about stopping Plavix so we will have to reschedule procedure. She states she will reach out to the office and see if she can get an answer. I will send updated instructions in the mail. Pts aunt verbalized understanding.

## 2018-04-15 ENCOUNTER — Encounter: Payer: Self-pay | Admitting: Gastroenterology

## 2018-04-22 ENCOUNTER — Encounter: Payer: Self-pay | Admitting: Gastroenterology

## 2018-05-15 ENCOUNTER — Telehealth: Payer: Self-pay | Admitting: Gastroenterology

## 2018-05-15 NOTE — Telephone Encounter (Signed)
Elenor Legato Alverda Skeans tells me she will go personally to patient's physician office on Monday 05-18-18. She is going to ask them to call us with his Plavix instructions. Advised her we will cancel the procedure for now. Once we get the Plavix instructions we will be happy to reschedule. She verbalized understanding.

## 2018-05-18 NOTE — Telephone Encounter (Signed)
When we have plavix instructions, please give to patient and reschedule exam

## 2018-05-19 ENCOUNTER — Encounter: Payer: Self-pay | Admitting: Gastroenterology

## 2019-09-08 ENCOUNTER — Encounter: Payer: Self-pay | Admitting: Gastroenterology

## 2019-11-11 ENCOUNTER — Encounter: Payer: Self-pay | Admitting: Physician Assistant

## 2019-11-11 ENCOUNTER — Telehealth: Payer: Self-pay | Admitting: *Deleted

## 2019-11-11 ENCOUNTER — Ambulatory Visit: Payer: Medicare Other | Attending: Internal Medicine

## 2019-11-11 ENCOUNTER — Ambulatory Visit (INDEPENDENT_AMBULATORY_CARE_PROVIDER_SITE_OTHER): Payer: Medicare Other | Admitting: Physician Assistant

## 2019-11-11 VITALS — BP 170/80 | HR 104 | Temp 97.5°F | Ht 66.0 in | Wt 188.8 lb

## 2019-11-11 DIAGNOSIS — Z1212 Encounter for screening for malignant neoplasm of rectum: Secondary | ICD-10-CM | POA: Diagnosis not present

## 2019-11-11 DIAGNOSIS — Z23 Encounter for immunization: Secondary | ICD-10-CM

## 2019-11-11 DIAGNOSIS — Z7901 Long term (current) use of anticoagulants: Secondary | ICD-10-CM | POA: Diagnosis not present

## 2019-11-11 DIAGNOSIS — Z1211 Encounter for screening for malignant neoplasm of colon: Secondary | ICD-10-CM | POA: Diagnosis not present

## 2019-11-11 MED ORDER — NA SULFATE-K SULFATE-MG SULF 17.5-3.13-1.6 GM/177ML PO SOLN: 1.0000 | Freq: Once | ORAL | 0 refills | Status: AC

## 2019-11-11 NOTE — Telephone Encounter (Signed)
Faxed clearance request to hold Plavix 5 days prior to procedure to Dr. Alphonzo Grieve office.

## 2019-11-11 NOTE — Progress Notes (Signed)
Chief Complaint: Preprocedural visit for recent colonoscopy for patient on chronic anticoagulation  HPI:    Adam Callahan is a 59 year old male, assigned to Dr. Loletha Carrow, with a past medical history as listed below including stroke on Plavix, who was referred to me by Randel Pigg, * for a preprocedural visit.      02/17/2018 patient seen in clinic by me for anemia.   At that time patient had previously been seen 06/03/2017 for screening colonoscopy was primary care provider canceled these because the patient's blood pressure and blood sugar were elevated.  At that time he had recently been told he was anemic.  His aunt was requesting MiraLAX prep.  He was scheduled for EGD and colonoscopy for his anemia.  These procedures were never done.    08/26/2019 CMP with a glucose elevated at 100 and otherwise normal, hemoglobin A1c 6.9.  CBC with a hemoglobin normal at 13.6.  Patient is no longer taking iron.  It was discussed that he was due for repeat colonoscopy.    Today, the patient presents to clinic accompanied by his family member who also assists with taking care of him.  He describes that he had a colonoscopy "many many years ago", at least 4 at [facility].  I told him I cannot find the records in the chart.  Patient explains that he knows he is due again though.  Tells me he is no longer anemic and is not on iron or any other supplementation.    Denies fever, chills, weight loss, change in bowel habits, abdominal pain, heartburn, reflux, nausea or vomiting.     Past Medical History:  Diagnosis Date  . Diabetes mellitus   . Hypertension   . Stroke [facility])     Past Surgical History:  Procedure Laterality Date  . TEE WITHOUT CARDIOVERSION N/A 05/03/2014   Procedure: TRANSESOPHAGEAL ECHOCARDIOGRAM (TEE);  Surgeon: Pixie Casino, MD;  Location: [facility] ENDOSCOPY;  Service: Cardiovascular;  Laterality: N/A;    Current Outpatient Medications  Medication Sig Dispense Refill  .  amLODipine (NORVASC) 5 MG tablet Take 5 mg by mouth daily.    Marland Kitchen aspirin 81 MG chewable tablet Chew 1 tablet (81 mg total) by mouth daily. 30 tablet 0  . atorvastatin (LIPITOR) 20 MG tablet Take 1 tablet (20 mg total) by mouth daily at 6 PM. 30 tablet 0  . cloNIDine (CATAPRES) 0.1 MG tablet Take 0.1 mg by mouth 2 (two) times daily.    . clopidogrel (PLAVIX) 75 MG tablet Take 1 tablet (75 mg total) by mouth daily with breakfast. 30 tablet 0  . insulin aspart (NOVOLOG) 100 UNIT/ML injection Inject 8 Units into the skin 3 (three) times daily before meals.    . insulin glargine (LANTUS) 100 UNIT/ML injection Inject 27 Units into the skin daily.    . meloxicam (MOBIC) 15 MG tablet Take 1 tablet by mouth daily.  1   No current facility-administered medications for this visit.    Allergies as of 11/11/2019  . (No Known Allergies)    Family History  Problem Relation Age of Onset  . Cervical cancer Mother   . Hypertension Sister   . Diabetes Mellitus II Maternal Uncle   . Colon cancer Maternal Aunt     Social History   Socioeconomic History  . Marital status: Divorced    Spouse name: Not on file  . Number of children: 2  . Years of education: 67  . Highest education level: Not on  file  Occupational History  . Not on file  Tobacco Use  . Smoking status: Never Smoker  . Smokeless tobacco: Never Used  Substance and Sexual Activity  . Alcohol use: No    Alcohol/week: 0.0 standard drinks  . Drug use: No  . Sexual activity: Not on file  Other Topics Concern  . Not on file  Social History Narrative   Patient is single and has 2 children.   Patient is right handed.   Patient has hs education.   Patient drinks diet caffeine free sodas daily.   Social Determinants of Health   Financial Resource Strain:   . Difficulty of Paying Living Expenses:   Food Insecurity:   . Worried About Charity fundraiser in the Last Year:   . Arboriculturist in the Last Year:   Transportation Needs:     . Film/video editor (Medical):   Marland Kitchen Lack of Transportation (Non-Medical):   Physical Activity:   . Days of Exercise per Week:   . Minutes of Exercise per Session:   Stress:   . Feeling of Stress :   Social Connections:   . Frequency of Communication with Friends and Family:   . Frequency of Social Gatherings with Friends and Family:   . Attends Religious Services:   . Active Member of Clubs or Organizations:   . Attends Archivist Meetings:   Marland Kitchen Marital Status:   Intimate Partner Violence:   . Fear of Current or Ex-Partner:   . Emotionally Abused:   Marland Kitchen Physically Abused:   . Sexually Abused:     Review of Systems:    Constitutional: No weight loss,fever or chills Cardiovascular: No chest pain Respiratory: No SOB Gastrointestinal: See HPI and otherwise negative   Physical Exam:  Vital signs: BP (!) 170/80   Pulse (!) 104   Temp (!) 97.5 F (36.4 C)   Ht 5\' 6"  (1.676 m)   Wt 188 lb 12.8 oz (85.6 kg)   BMI 30.47 kg/m   Constitutional:   Pleasant AA male appears to be in NAD, Well developed, Well nourished, alert and cooperative Respiratory: Respirations even and unlabored. Lungs clear to auscultation bilaterally.   No wheezes, crackles, or rhonchi.  Cardiovascular: Normal S1, S2. No MRG. Regular rate and rhythm. No peripheral edema, cyanosis or pallor.  Gastrointestinal:  Soft, nondistended, nontender. No rebound or guarding. Normal bowel sounds. No appreciable masses or hepatomegaly. Rectal:  Not performed.  Msk:  Symmetrical without gross deformities. Without edema, no deformity or joint abnormality. +right sided weakness, ambulates with cane Psychiatric: Demonstrates good judgement and reason without abnormal affect or behaviors.  See HPI for recent labs.  Assessment: 1.  Screening for colorectal cancer: Patient reports a distant colonoscopy, I cannot find record of 1 in his chart, but he tells me he has been at least 10 years, he is due now for repeat  for screening reasons 2.  Chronic anticoagulation status post stroke: With Plavix  Plan: 1.  Reviewed chart, patient's last colonoscopy cannot be found.  According to PCP he is due for repeat.  At time of last visit in 2019 patient was seen for anemia but no labs were ever found to support this.  Discussed this with the patient.  Currently his hemoglobin is normal and he is not on supplementation.  Recommend that we proceed with a screening colonoscopy at this time, scheduled in the [place] with Dr. Loletha Carrow.  Did discuss risks, benefits, limitations and alternatives  and the patient agrees to proceed. 2.  Patient was advised to hold his Plavix for 5 days prior to time of procedure.  We will contact his prescribing physician to ensure this is acceptable for him.  The last time we tried to do this there was some delay, we will make sure that colonoscopy is scheduled at least a month from now to give [place] time to receive clearance. 3.  Patient to follow in clinic per recommendations from Dr. Loletha Carrow after time of procedure.  Ellouise Newer, PA-C [facility] 11/11/2019, 3:27 PM  Cc: Ahluwalia, Shamsher S, *

## 2019-11-11 NOTE — Progress Notes (Signed)
   Covid-19 Vaccination Clinic  Name:  Adam Callahan    MRN: [id number] DOB: 17-Jan-1961  11/11/2019  [name] was observed post Covid-19 immunization for 15 minutes without incident. He was provided with Vaccine Information Sheet and instruction to access the V-Safe system.   [name] was instructed to call 911 with any severe reactions post vaccine: Marland Kitchen Difficulty breathing  . Swelling of face and throat  . A fast heartbeat  . A bad rash all over body  . Dizziness and weakness   Immunizations Administered    Name Date Dose VIS Date Route   Pfizer COVID-19 Vaccine 11/11/2019  4:50 PM 0.3 mL 09/15/2018 Intramuscular   Manufacturer: [employer], [employer]   Lot: [id number]   [place]: [id number]

## 2019-11-11 NOTE — Patient Instructions (Addendum)
If you are age 59 or older, your body mass index should be between 23-30. Your Body mass index is 30.47 kg/m. If this is out of the aforementioned range listed, please consider follow up with your Primary Care Provider.  If you are age 23 or younger, your body mass index should be between 19-25. Your Body mass index is 30.47 kg/m. If this is out of the aformentioned range listed, please consider follow up with your Primary Care Provider.   You will be contacted by our office prior to your procedure for directions on holding your Plavix.  If you do not hear from our office 1 week prior to your scheduled procedure, please call [phone] to discuss.    You have been scheduled for a colonoscopy. Please follow written instructions given to you at your visit today.  Please pick up your prep supplies at the pharmacy within the next 1-3 days. If you use inhalers (even only as needed), please bring them with you on the day of your procedure.

## 2019-11-16 NOTE — Progress Notes (Signed)
____________________________________________________________  Attending physician addendum:  Thank you for sending this case to me. I have reviewed the entire note, and the outlined plan seems appropriate.  Endre Coutts Danis, MD  ____________________________________________________________  

## 2019-12-06 ENCOUNTER — Ambulatory Visit: Payer: Medicare Other | Attending: Internal Medicine

## 2019-12-06 DIAGNOSIS — Z23 Encounter for immunization: Secondary | ICD-10-CM

## 2019-12-06 NOTE — Progress Notes (Signed)
   Covid-19 Vaccination Clinic  Name:  Adam Callahan    MRN: [id number] DOB: [birth year]  12/06/2019  Adam Callahan was observed post Covid-19 immunization for 15 minutes without incident. He was provided with Vaccine Information Sheet and instruction to access the V-Safe system.   Adam Callahan was instructed to call 911 with any severe reactions post vaccine: Marland Kitchen Difficulty breathing  . Swelling of face and throat  . A fast heartbeat  . A bad rash all over body  . Dizziness and weakness   Immunizations Administered    Name Date Dose VIS Date Route   Pfizer COVID-19 Vaccine 12/06/2019  8:12 AM 0.3 mL 09/15/2018 Intramuscular   Manufacturer: [place]   Lot: [id number]   [place]: [id number]

## 2019-12-07 ENCOUNTER — Telehealth: Payer: Self-pay | Admitting: Physician Assistant

## 2019-12-07 MED ORDER — NA SULFATE-K SULFATE-MG SULF 17.5-3.13-1.6 GM/177ML PO SOLN: 1.0000 | Freq: Once | ORAL | 0 refills | Status: AC

## 2019-12-07 NOTE — Telephone Encounter (Signed)
Script sent into pharmacy 

## 2019-12-09 NOTE — Telephone Encounter (Signed)
Spoke with patient care giver and told her we have not received the clearance from his PCP. Re faxed letter to Dr. Alphonzo Grieve office today to hold Plavix 5 days prior to procedure.

## 2019-12-10 NOTE — Telephone Encounter (Signed)
Refaxed clearance letter to Dr. Alphonzo Grieve to the number provided below.

## 2019-12-10 NOTE — Telephone Encounter (Signed)
Pt's aunt called to inform that Dr. Alphonzo Grieve has not received cardiac clearance yet. She thinks that she gave you the wrong fax number [place] fax it to [phone].

## 2019-12-13 NOTE — Telephone Encounter (Addendum)
Spoke with Volney Presser, [place] office and confirmed they did receive clearance request. Dr. Alphonzo Grieve is no longer seeing patient's at [facility]. Patient now see Volney Presser, FNP.

## 2019-12-13 NOTE — Telephone Encounter (Signed)
Left message at Dr. Alphonzo Grieve office to call me in reference to clearance.

## 2019-12-14 NOTE — Telephone Encounter (Signed)
Per Volney Presser, FNP patient may hold Plavix 2 days prior to colonoscopy and restart 2 days after procedure. Is this okay with you?

## 2019-12-14 NOTE — Telephone Encounter (Signed)
Spoke with patient's aunt (his caregiver) and informed her to have patient stop Plavix 5 days prior to procedure and to continue his Aspirin daily. Patient's aunt voiced understanding.

## 2019-12-14 NOTE — Telephone Encounter (Signed)
It appears the PCP has misunderstood our request and the rationale for holding plavix 5 days rather than 2 days.  The patient's CVA was years ago.  I am making the decision to instruct patient to hold plavix 5 days prior to procedure and continue aspirin including on day of procedure. Please tell him that. Thank you.  - H. Loletha Carrow, MD

## 2019-12-22 ENCOUNTER — Encounter: Payer: Self-pay | Admitting: Gastroenterology

## 2019-12-22 ENCOUNTER — Ambulatory Visit (AMBULATORY_SURGERY_CENTER): Payer: Medicare Other | Admitting: Gastroenterology

## 2019-12-22 ENCOUNTER — Other Ambulatory Visit: Payer: Self-pay

## 2019-12-22 VITALS — BP 150/74 | HR 89 | Temp 97.5°F | Resp 17 | Ht 66.0 in | Wt 188.0 lb

## 2019-12-22 DIAGNOSIS — Z1211 Encounter for screening for malignant neoplasm of colon: Secondary | ICD-10-CM

## 2019-12-22 DIAGNOSIS — D123 Benign neoplasm of transverse colon: Secondary | ICD-10-CM

## 2019-12-22 MED ORDER — SODIUM CHLORIDE 0.9 % IV SOLN: 500.0000 mL | Freq: Once | INTRAVENOUS | Status: DC

## 2019-12-22 NOTE — Op Note (Signed)
[place] Patient Name: Adam Callahan Procedure Date: 12/22/2019 8:46 AM MRN: [id number] Endoscopist: Kent Narrows. Loletha Carrow , MD Age: 59 Referring MD:  Date of Birth: [birth year] Gender: Male Account #: [id number] Procedure:                Colonoscopy Indications:              Screening for colorectal malignant neoplasm (last                            colonoscopy reportedly > 10 years ago) Medicines:                Monitored Anesthesia Care Procedure:                Pre-Anesthesia Assessment:                           - Prior to the procedure, a History and Physical                            was performed, and patient medications and                            allergies were reviewed. The patient's tolerance of                            previous anesthesia was also reviewed. The risks                            and benefits of the procedure and the sedation                            options and risks were discussed with the patient.                            All questions were answered, and informed consent                            was obtained. Prior Anticoagulants: The patient has                            taken Plavix (clopidogrel), last dose was 5 days                            prior to procedure (remained on aspirin). ASA Grade                            Assessment: III - A patient with severe systemic                            disease. After reviewing the risks and benefits,                            the patient was deemed in satisfactory condition to  undergo the procedure.                           After obtaining informed consent, the colonoscope                            was passed under direct vision. Throughout the                            procedure, the patient's blood pressure, pulse, and                            oxygen saturations were monitored continuously. The                            Colonoscope was introduced through  the anus and                            advanced to the the cecum, identified by                            appendiceal orifice and ileocecal valve. The                            colonoscopy was performed with difficulty due to                            poor bowel prep, a redundant colon and significant                            looping. Successful completion of the procedure was                            aided by using manual pressure and lavage. The                            patient tolerated the procedure fairly well. The                            quality of the bowel preparation was poor. The                            ileocecal valve, appendiceal orifice, and rectum                            were photographed. The bowel preparation used was                            SUPREP. Scope In: 8:55:45 AM Scope Out: 9:14:12 AM Scope Withdrawal Time: 0 hours 9 minutes 26 seconds  Total Procedure Duration: 0 hours 18 minutes 27 seconds  Findings:                 The perianal and digital rectal examinations were  normal.                           Semi-liquid stool was found in the entire colon,                            interfering with visualization. Lavage of the area                            was performed using a large amount, resulting in                            incomplete clearance with fair visualization.                           A diminutive polyp was found in the descending                            colon. The polyp was sessile. The polyp was removed                            with a cold snare. Resection was complete, but the                            polyp tissue was not retrieved.                           The colon (entire examined portion) was                            significantly redundant.                           The exam was otherwise without abnormality on                            direct and retroflexion views. Complications:             No immediate complications. Estimated Blood Loss:     Estimated blood loss was minimal. Impression:               - Preparation of the colon was poor.                           - Stool in the entire examined colon.                           - One diminutive polyp in the descending colon,                            removed with a cold snare. Complete resection.                            Polyp tissue not retrieved.                           -  Redundant colon.                           - The examination was otherwise normal on direct                            and retroflexion views. Recommendation:           - Patient has a contact number available for                            emergencies. The signs and symptoms of potential                            delayed complications were discussed with the                            patient. Return to normal activities tomorrow.                            Written discharge instructions were provided to the                            patient.                           - Resume previous diet.                           - Resume Plavix (clopidogrel) at prior dose today.                           - Repeat colonoscopy in 1 year for surveillance due                            to poor prep (2-DAY PREP FOR NEXT EXAM). Damara Klunder L. Loletha Carrow, MD 12/22/2019 9:20:54 AM This report has been signed electronically.

## 2019-12-22 NOTE — Progress Notes (Signed)
Called to room to assist during endoscopic procedure.  Patient ID and intended procedure confirmed with present staff. Received instructions for my participation in the procedure from the performing physician.  

## 2019-12-22 NOTE — Progress Notes (Signed)
Pt's states no medical or surgical changes since previsit or office visit. 

## 2019-12-22 NOTE — Progress Notes (Signed)
Report given to PACU, vss 

## 2019-12-22 NOTE — Progress Notes (Signed)
Labetalol 5 mg IV given due to starting 161/110, vss MD updated, vss

## 2019-12-22 NOTE — Patient Instructions (Signed)
YOU HAD AN ENDOSCOPIC PROCEDURE TODAY AT THE [facility]:   Refer to the procedure report that was given to you for any specific questions about what was found during the examination.  If the procedure report does not answer your questions, please call your gastroenterologist to clarify.  If you requested that your care partner not be given the details of your procedure findings, then the procedure report has been included in a sealed envelope for you to review at your convenience later.  YOU SHOULD EXPECT: Some feelings of bloating in the abdomen. Passage of more gas than usual.  Walking can help get rid of the air that was put into your GI tract during the procedure and reduce the bloating. If you had a lower endoscopy (such as a colonoscopy or flexible sigmoidoscopy) you may notice spotting of blood in your stool or on the toilet paper. If you underwent a bowel prep for your procedure, you may not have a normal bowel movement for a few days.  Please Note:  You might notice some irritation and congestion in your nose or some drainage.  This is from the oxygen used during your procedure.  There is no need for concern and it should clear up in a day or so.  SYMPTOMS TO REPORT IMMEDIATELY:   Following lower endoscopy (colonoscopy or flexible sigmoidoscopy):  Excessive amounts of blood in the stool  Significant tenderness or worsening of abdominal pains  Swelling of the abdomen that is new, acute  Fever of 100F or higher  For urgent or emergent issues, a gastroenterologist can be reached at any hour by calling [phone]. Do not use MyChart messaging for urgent concerns.    DIET:  We do recommend a small meal at first, but then you may proceed to your regular diet.  Drink plenty of fluids but you should avoid alcoholic beverages for 24 hours.  ACTIVITY:  You should plan to take it easy for the rest of today and you should NOT DRIVE or use heavy machinery until tomorrow (because  of the sedation medicines used during the test).    FOLLOW UP: Our staff will call the number listed on your records 48-72 hours following your procedure to check on you and address any questions or concerns that you may have regarding the information given to you following your procedure. If we do not reach you, we will leave a message.  We will attempt to reach you two times.  During this call, we will ask if you have developed any symptoms of COVID 19. If you develop any symptoms (ie: fever, flu-like symptoms, shortness of breath, cough etc.) before then, please call [phone].  If you test positive for Covid 19 in the 2 weeks post procedure, please call and report this information to us.    If any biopsies were taken you will be contacted by phone or by letter within the next 1-3 weeks.  Please call us at [phone] if you have not heard about the biopsies in 3 weeks.    SIGNATURES/CONFIDENTIALITY: You and/or your care partner have signed paperwork which will be entered into your electronic medical record.  These signatures attest to the fact that that the information above on your After Visit Summary has been reviewed and is understood.  Full responsibility of the confidentiality of this discharge information lies with you and/or your care-partner. 

## 2019-12-24 ENCOUNTER — Telehealth: Payer: Self-pay

## 2019-12-24 NOTE — Telephone Encounter (Signed)
  Follow up Call-  Call back number 12/22/2019  Post procedure Call Back phone  # [phone]  Permission to leave phone message Yes  Some recent data might be hidden     Patient questions:  Do you have a fever, pain , or abdominal swelling? No. Pain Score  0 *  Have you tolerated food without any problems? Yes.    Have you been able to return to your normal activities? Yes.    Do you have any questions about your discharge instructions: Diet   No. Medications  No. Follow up visit  No.  Do you have questions or concerns about your Care? No.  Actions: * If pain score is 4 or above: No action needed, pain <4. 1. Have you developed a fever since your procedure? no  2.   Have you had an respiratory symptoms (SOB or cough) since your procedure? no  3.   Have you tested positive for COVID 19 since your procedure no  4.   Have you had any family members/close contacts diagnosed with the COVID 19 since your procedure?  no   If yes to any of these questions please route to Joylene John, RN and Erenest Rasher, RN

## 2020-09-19 ENCOUNTER — Ambulatory Visit: Payer: Medicare Other | Admitting: Critical Care Medicine

## 2020-10-26 ENCOUNTER — Ambulatory Visit: Payer: Medicare Other | Admitting: Critical Care Medicine

## 2020-12-18 NOTE — Progress Notes (Deleted)
   Subjective:    [name], male    DOB: April 16, 1961, 60 y.o.   MRN: [id number]  59 y.o.M here to est pcp   5/31 Hx T2DM, CVA,      Review of Systems     Objective:   Physical Exam        Assessment & Plan:

## 2020-12-19 ENCOUNTER — Ambulatory Visit: Payer: Medicare Other | Admitting: Critical Care Medicine

## 2021-03-23 ENCOUNTER — Other Ambulatory Visit: Payer: Self-pay

## 2021-03-23 ENCOUNTER — Ambulatory Visit (INDEPENDENT_AMBULATORY_CARE_PROVIDER_SITE_OTHER): Payer: Medicare Other | Admitting: Primary Care

## 2021-03-23 ENCOUNTER — Encounter (INDEPENDENT_AMBULATORY_CARE_PROVIDER_SITE_OTHER): Payer: Self-pay | Admitting: Primary Care

## 2021-03-23 VITALS — BP 144/79 | HR 72 | Temp 97.5°F | Ht 66.0 in | Wt 185.4 lb

## 2021-03-23 DIAGNOSIS — E876 Hypokalemia: Secondary | ICD-10-CM

## 2021-03-23 DIAGNOSIS — Z131 Encounter for screening for diabetes mellitus: Secondary | ICD-10-CM

## 2021-03-23 DIAGNOSIS — Z7689 Persons encountering health services in other specified circumstances: Secondary | ICD-10-CM

## 2021-03-23 DIAGNOSIS — Z8639 Personal history of other endocrine, nutritional and metabolic disease: Secondary | ICD-10-CM

## 2021-03-23 DIAGNOSIS — I10 Essential (primary) hypertension: Secondary | ICD-10-CM

## 2021-03-23 DIAGNOSIS — I639 Cerebral infarction, unspecified: Secondary | ICD-10-CM

## 2021-03-23 LAB — POCT GLYCOSYLATED HEMOGLOBIN (HGB A1C): Hemoglobin A1C: 6.9 % — AB (ref 4.0–5.6)

## 2021-03-23 MED ORDER — TRIAMTERENE-HCTZ 37.5-25 MG PO TABS: 1.0000 | ORAL_TABLET | Freq: Every day | ORAL | 1 refills | Status: DC

## 2021-03-23 MED ORDER — CLONIDINE 0.2 MG/24HR TD PTWK: 0.2000 mg | MEDICATED_PATCH | TRANSDERMAL | 12 refills | Status: DC

## 2021-03-23 MED ORDER — AMLODIPINE BESYLATE 10 MG PO TABS: 10.0000 mg | ORAL_TABLET | Freq: Every day | ORAL | 1 refills | Status: DC

## 2021-03-23 MED ORDER — LANTUS SOLOSTAR 100 UNIT/ML ~~LOC~~ SOPN: 10.0000 [IU] | PEN_INJECTOR | Freq: Every day | SUBCUTANEOUS | 99 refills | Status: DC

## 2021-03-23 NOTE — Progress Notes (Signed)
New Patient Office Visit  Subjective:  [name], male    DOB: [birth year]  Age: 60 y.o. MRN: [id number]  CC:  Chief Complaint  Patient presents with   New Patient (Initial Visit)    HPI Mr.Adam Callahan is a 60 year old male presents for establishment of care. Bp is elevated -Denies shortness of breath, headaches, chest pain or lower extremity edema. He has right arm weakness s/p CVA however has been exercising arm at home and regaining mobility and strength. Unstable gait uses quad cane for stability. Increase risk for falls with right side weakness.  Past Medical History:  Diagnosis Date   Diabetes mellitus    Hypertension    Stroke [facility])     Past Surgical History:  Procedure Laterality Date   TEE WITHOUT CARDIOVERSION N/A 05/03/2014   Procedure: TRANSESOPHAGEAL ECHOCARDIOGRAM (TEE);  Surgeon: Pixie Casino, MD;  Location: [facility] ENDOSCOPY;  Service: Cardiovascular;  Laterality: N/A;    Family History  Problem Relation Age of Onset   Cervical cancer Mother    Hypertension Sister    Diabetes Mellitus II Maternal Uncle    Colon cancer Maternal Aunt     Social History   Socioeconomic History   Marital status: Divorced    Spouse name: Not on file   Number of children: 2   Years of education: 12   Highest education level: Not on file  Occupational History   Not on file  Tobacco Use   Smoking status: Never   Smokeless tobacco: Never  Vaping Use   Vaping Use: Never used  Substance and Sexual Activity   Alcohol use: No    Alcohol/week: 0.0 standard drinks   Drug use: No   Sexual activity: Not on file  Other Topics Concern   Not on file  Social History Narrative   Patient is single and has 2 children.   Patient is right handed.   Patient has hs education.   Patient drinks diet caffeine free sodas daily.   Social Determinants of Health   Financial Resource Strain: Not on file  Food Insecurity: Not on file  Transportation Needs: Not  on file  Physical Activity: Not on file  Stress: Not on file  Social Connections: Not on file  Intimate Partner Violence: Not on file    ROS Review of Systems  Gastrointestinal:  Positive for abdominal distention.  Endocrine: Positive for polyphagia.  Neurological:  Positive for weakness.       Right side s/p CVA  Psychiatric/Behavioral:         Memory loss   All other systems reviewed and are negative.  Objective:   Today's Vitals: BP (!) 144/79 (BP Location: Left Arm, Patient Position: Sitting, Cuff Size: Normal)   Pulse 72   Temp (!) 97.5 F (36.4 C) (Temporal)   Ht _0  (1.676 m)   Wt 185 lb 6.4 oz (84.1 kg)   SpO2 97%   BMI 29.92 kg/m   Physical Exam Vitals reviewed.  Constitutional:      Appearance: He is obese.  HENT:     Head: Normocephalic.     Right Ear: Tympanic membrane normal.     Left Ear: Tympanic membrane normal.     Nose: Nose normal.  Eyes:     Extraocular Movements: Extraocular movements intact.     Pupils: Pupils are equal, round, and reactive to light.  Cardiovascular:     Rate and Rhythm: Normal rate and regular rhythm.  Pulmonary:     Effort: Pulmonary effort is normal.     Breath sounds: Normal breath sounds.  Abdominal:     General: Bowel sounds are normal. There is distension.     Palpations: Abdomen is soft.  Musculoskeletal:        General: Normal range of motion.     Cervical back: Normal range of motion.  Skin:    General: Skin is warm and dry.  Neurological:     Mental Status: He is alert and oriented to person, place, and time.  Psychiatric:        Mood and Affect: Mood normal.        Behavior: Behavior normal.        Thought Content: Thought content normal.        Judgment: Judgment normal.   Assessment & Plan:  Adam Callahan was seen today for new patient (initial visit).  Diagnoses and all orders for this visit:  Screening for diabetes mellitus -     amLODipine (NORVASC) 10 MG tablet; Take 1 tablet (10 mg total) by  mouth daily. -     cloNIDine (CATAPRES - DOSED IN MG/24 HR) 0.2 mg/24hr patch; Place 1 patch (0.2 mg total) onto the skin once a week. -     triamterene-hydrochlorothiazide (MAXZIDE-25) 37.5-25 MG tablet; Take 1 tablet by mouth daily. -     Lipid Panel -     CMP14+EGFR -     CBC with Differential -     insulin glargine (LANTUS SOLOSTAR) 100 UNIT/ML Solostar Pen; Inject 10 Units into the skin daily.  History of diabetes mellitus ADA recommends the following therapeutic goals for glycemic control related to A1c measurements: Goal of therapy: Less than 6.5 hemoglobin A1c.  Reference clinical practice recommendations. Foods that are high in carbohydrates are the following rice, potatoes, breads, sugars, and pastas.  Reduction in the intake (eating) will assist in lowering your blood sugars.  -     HgB A1c 6.9 Prediabetes  -  -     Lipid Panel -     CMP14+EGFR -     CBC with Differential -     insulin glargine (LANTUS SOLOSTAR) 100 UNIT/ML Solostar Pen; Inject 10 Units into the skin daily.  Hypokalemia -     CMP14+EGFR  Essential hypertension Counseled on blood pressure goal of less than 130/80, low-sodium, DASH diet, medication compliance, 150 minutes of moderate intensity exercise per week. Discussed medication compliance, adverse effects.  -     amLODipine (NORVASC) 10 MG tablet; Take 1 tablet (10 mg total) by mouth daily. -     cloNIDine (CATAPRES - DOSED IN MG/24 HR) 0.2 mg/24hr patch; Place 1 patch (0.2 mg total) onto the skin once a week. -     triamterene-hydrochlorothiazide (MAXZIDE-25) 37.5-25 MG tablet; Take 1 tablet by mouth daily.  Cerebrovascular accident (CVA), unspecified mechanism ([place]) With residual weakness right side and slurred speech -     Lipid Panel -     CMP14+EGFR   Encounter to establish care Establish care with new PCP  Outpatient Encounter Medications as of 03/23/2021  Medication Sig   ASPIRIN LOW DOSE 81 MG EC tablet Take 81 mg by mouth daily.   cloNIDine  (CATAPRES - DOSED IN MG/24 HR) 0.2 mg/24hr patch Place 1 patch (0.2 mg total) onto the skin once a week.   clopidogrel (PLAVIX) 75 MG tablet Take 75 mg by mouth daily.   insulin glargine (LANTUS SOLOSTAR) 100 UNIT/ML Solostar Pen Inject 10  Units into the skin daily.   pravastatin (PRAVACHOL) 40 MG tablet Take 40 mg by mouth daily.   [DISCONTINUED] amLODipine (NORVASC) 10 MG tablet Take 10 mg by mouth daily.   [DISCONTINUED] cloNIDine (CATAPRES) 0.2 MG tablet Take 0.2 mg by mouth 2 (two) times daily.   [DISCONTINUED] LANTUS SOLOSTAR 100 UNIT/ML Solostar Pen Inject 38 Units into the skin at bedtime.   [DISCONTINUED] NOVOLOG FLEXPEN 100 UNIT/ML FlexPen SMARTSIG:6 Unit(s) Topical 3 Times Daily   [DISCONTINUED] triamterene-hydrochlorothiazide (MAXZIDE-25) 37.5-25 MG tablet Take by mouth.   amLODipine (NORVASC) 10 MG tablet Take 1 tablet (10 mg total) by mouth daily.   triamterene-hydrochlorothiazide (MAXZIDE-25) 37.5-25 MG tablet Take 1 tablet by mouth daily.   [DISCONTINUED] amLODipine (NORVASC) 5 MG tablet Take 5 mg by mouth daily.   [DISCONTINUED] aspirin 81 MG chewable tablet Chew 1 tablet (81 mg total) by mouth daily.   [DISCONTINUED] atorvastatin (LIPITOR) 20 MG tablet Take 1 tablet (20 mg total) by mouth daily at 6 PM.   [DISCONTINUED] cloNIDine (CATAPRES) 0.1 MG tablet Take 0.1 mg by mouth 2 (two) times daily.   [DISCONTINUED] clopidogrel (PLAVIX) 75 MG tablet Take 1 tablet (75 mg total) by mouth daily with breakfast.   [DISCONTINUED] hydrochlorothiazide (HYDRODIURIL) 25 MG tablet Take by mouth.   [DISCONTINUED] insulin aspart (NOVOLOG) 100 UNIT/ML injection Inject 8 Units into the skin 3 (three) times daily before meals.   [DISCONTINUED] insulin glargine (LANTUS) 100 UNIT/ML injection Inject 27 Units into the skin daily.   [DISCONTINUED] meloxicam (MOBIC) 15 MG tablet Take 1 tablet by mouth daily.   Facility-Administered Encounter Medications as of 03/23/2021  Medication   0.9 %  sodium  chloride infusion    Follow-up: Return in about 2 weeks (around 04/06/2021) for 4 weeks Luke/ BP check .   Kerin Perna, NP

## 2021-03-23 NOTE — Patient Instructions (Signed)
Dulaglutide Injection What is this medication? DULAGLUTIDE (DOO la GLOO tide) treats type 2 diabetes. It works by increasing insulin levels in your body, which decreases your blood sugar (glucose). It also reduces the amount of sugar released into your blood and slows down your digestion. It can also be used to lower the risk of heart attack and stroke in people with type 2 diabetes. Changes to diet and exercise are often combined with this medication. This medicine may be used for other purposes; ask your health care provider or pharmacist if you have questions. COMMON BRAND NAME(S): Trulicity What should I tell my care team before I take this medication? They need to know if you have any of these conditions: Endocrine tumors (MEN 2) or if someone in your family had these tumors Eye disease, vision problems History of pancreatitis Kidney disease Liver disease Stomach or intestine problems Thyroid cancer or if someone in your family had thyroid cancer An unusual or allergic reaction to dulaglutide, other medications, foods, dyes, or preservatives Pregnant or trying to get pregnant Breast-feeding How should I use this medication? This medication is injected under the skin. You will be taught how to prepare and give it. Take it as directed on the prescription label on the same day of each week. Do NOT prime the pen. Keep taking it unless your care team tells you to stop. If you use this medication with insulin, you should inject this medication and the insulin separately. Do not mix them together. Do not give the injections right next to each other. Change (rotate) injection sites with each injection. This medication comes with INSTRUCTIONS FOR USE. Ask your pharmacist for directions on how to use this medication. Read the information carefully. Talk to your pharmacist or care team if you have questions. It is important that you put your used needles and syringes in a special sharps container. Do  not put them in a trash can. If you do not have a sharps container, call your pharmacist or care team to get one. A special MedGuide will be given to you by the pharmacist with each prescription and refill. Be sure to read this information carefully each time. Talk to your care team about the use of this medication in children. Special care may be needed. Overdosage: If you think you have taken too much of this medicine contact a poison control center or emergency room at once. NOTE: This medicine is only for you. Do not share this medicine with others. What if I miss a dose? If you miss a dose, take it as soon as you can unless it is more than 3 days late. If it is more than 3 days late, skip the missed dose. Take the next dose at the normal time. What may interact with this medication? Other medications for diabetes Many medications may cause changes in blood sugar, these include: Alcohol containing beverages Antiviral medications for HIV or AIDS Aspirin and aspirin-like medications Certain medications for blood pressure, heart disease, irregular heart beat Chromium Diuretics Male hormones, such as estrogens or progestins, birth control pills Fenofibrate Gemfibrozil Isoniazid Lanreotide Male hormones or anabolic steroids MAOIs like Carbex, Eldepryl, Marplan, Nardil, and Parnate Medications for allergies, asthma, cold, or cough Medications for depression, anxiety, or psychotic disturbances Medications for weight loss Niacin Nicotine NSAIDs, medications for pain and inflammation, like ibuprofen or naproxen Octreotide Pasireotide Pentamidine Phenytoin Probenecid Quinolone antibiotics such as ciprofloxacin, levofloxacin, ofloxacin Some herbal dietary supplements Steroid medications such as prednisone or cortisone Sulfamethoxazole;   trimethoprim Thyroid hormones Some medications can hide the warning symptoms of low blood sugar (hypoglycemia). You may need to monitor your blood  sugar more closely if you are taking one of these medications. These include: Beta-blockers, often used for high blood pressure or heart problems (examples include atenolol, metoprolol, propranolol) Clonidine Guanethidine Reserpine This list may not describe all possible interactions. Give your health care provider a list of all the medicines, herbs, non-prescription drugs, or dietary supplements you use. Also tell them if you smoke, drink alcohol, or use illegal drugs. Some items may interact with your medicine. What should I watch for while using this medication? Visit your care team for regular checks on your progress. Check with your care team if you have severe diarrhea, nausea, and vomiting, or if you sweat a lot. The loss of too much body fluid may make it dangerous for you to take this medication. A test called the HbA1C (A1C) will be monitored. This is a simple blood test. It measures your blood sugar control over the last 2 to 3 months. You will receive this test every 3 to 6 months. Learn how to check your blood sugar. Learn the symptoms of low and high blood sugar and how to manage them. Always carry a quick-source of sugar with you in case you have symptoms of low blood sugar. Examples include hard sugar candy or glucose tablets. Make sure others know that you can choke if you eat or drink when you develop serious symptoms of low blood sugar, such as seizures or unconsciousness. Get medical help at once. Tell your care team if you have high blood sugar. You might need to change the dose of your medication. If you are sick or exercising more than usual, you may need to change the dose of your medication. Do not skip meals. Ask your care team if you should avoid alcohol. Many nonprescription cough and cold products contain sugar or alcohol. These can affect blood sugar. Pens should never be shared. Even if the needle is changed, sharing may result in passing of viruses like hepatitis or  HIV. Wear a medical ID bracelet or chain. Carry a card that describes your condition. List the medications and doses you take on the card. What side effects may I notice from receiving this medication? Side effects that you should report to your care team as soon as possible: Allergic reactions-skin rash, itching, hives, swelling of the face, lips, tongue, or throat Change in vision Dehydration-increased thirst, dry mouth, feeling faint or lightheaded, headache, dark yellow or brown urine Kidney injury-decrease in the amount of urine, swelling of the ankles, hands, or feet Pancreatitis-severe stomach pain that spreads to your back or gets worse after eating or when touched, fever, nausea, vomiting Thyroid cancer-new mass or lump in the neck, pain or trouble swallowing, trouble breathing, hoarseness Side effects that usually do not require medical attention (report to your care team if they continue or are bothersome): Diarrhea Loss of appetite Nausea Stomach pain Vomiting This list may not describe all possible side effects. Call your doctor for medical advice about side effects. You may report side effects to FDA at 1-800-FDA-1088. Where should I keep my medication? Keep out of the reach of children and pets. Refrigeration (preferred): Store unopened pens in a refrigerator between 2 and 8 degrees C (36 and 46 degrees F). Keep it in the original carton until you are ready to take it. Do not freeze or use if the medication has been frozen.   Protect from light. Get rid of any unused medication after the expiration date on the label. Room Temperature: The pen may be stored at room temperature below 30 degrees C (86 degrees F) for up to a total of 14 days if needed. Protect from light. Avoid exposure to extreme heat. If it is stored at room temperature, throw away any unused medication after 14 days or after it expires, whichever is first. To get rid of medications that are no longer needed or have  expired: Take the medication to a medication take-back program. Check with your pharmacy or law enforcement to find a location. If you cannot return the medication, ask your pharmacist or care team how to get rid of this medication safely. NOTE: This sheet is a summary. It may not cover all possible information. If you have questions about this medicine, talk to your doctor, pharmacist, or health care provider.  2022 Elsevier/Gold Standard (2020-09-04 14:13:14)  

## 2021-03-24 LAB — CBC WITH DIFFERENTIAL/PLATELET
Basophils Absolute: 0.1 10*3/uL (ref 0.0–0.2)
Basos: 1 %
EOS (ABSOLUTE): 0.1 10*3/uL (ref 0.0–0.4)
Eos: 2 %
Hematocrit: 39.6 % (ref 37.5–51.0)
Hemoglobin: 13 g/dL (ref 13.0–17.7)
Immature Grans (Abs): 0 10*3/uL (ref 0.0–0.1)
Immature Granulocytes: 0 %
Lymphocytes Absolute: 2 10*3/uL (ref 0.7–3.1)
Lymphs: 30 %
MCH: 28.9 pg (ref 26.6–33.0)
MCHC: 32.8 g/dL (ref 31.5–35.7)
MCV: 88 fL (ref 79–97)
Monocytes Absolute: 0.6 10*3/uL (ref 0.1–0.9)
Monocytes: 9 %
Neutrophils Absolute: 4 10*3/uL (ref 1.4–7.0)
Neutrophils: 58 %
Platelets: 345 10*3/uL (ref 150–450)
RBC: 4.5 x10E6/uL (ref 4.14–5.80)
RDW: 13.7 % (ref 11.6–15.4)
WBC: 6.8 10*3/uL (ref 3.4–10.8)

## 2021-03-24 LAB — LIPID PANEL
Chol/HDL Ratio: 3 ratio (ref 0.0–5.0)
Cholesterol, Total: 143 mg/dL (ref 100–199)
HDL: 47 mg/dL (ref >39)
LDL Chol Calc (NIH): 87 mg/dL (ref 0–99)
Triglycerides: 39 mg/dL (ref 0–149)
VLDL Cholesterol Cal: 9 mg/dL (ref 5–40)

## 2021-03-24 LAB — CMP14+EGFR
ALT: 18 IU/L (ref 0–44)
AST: 19 IU/L (ref 0–40)
Albumin/Globulin Ratio: 1.3 (ref 1.2–2.2)
Albumin: 4.5 g/dL (ref 3.8–4.9)
Alkaline Phosphatase: 64 IU/L (ref 44–121)
BUN/Creatinine Ratio: 11 (ref 9–20)
BUN: 12 mg/dL (ref 6–24)
Bilirubin Total: 0.4 mg/dL (ref 0.0–1.2)
CO2: 25 mmol/L (ref 20–29)
Calcium: 9.4 mg/dL (ref 8.7–10.2)
Chloride: 102 mmol/L (ref 96–106)
Creatinine, Ser: 1.11 mg/dL (ref 0.76–1.27)
Globulin, Total: 3.4 g/dL (ref 1.5–4.5)
Glucose: 55 mg/dL — ABNORMAL LOW (ref 65–99)
Potassium: 4 mmol/L (ref 3.5–5.2)
Sodium: 141 mmol/L (ref 134–144)
Total Protein: 7.9 g/dL (ref 6.0–8.5)
eGFR: 77 mL/min/{1.73_m2} (ref >59)

## 2021-03-27 ENCOUNTER — Telehealth (INDEPENDENT_AMBULATORY_CARE_PROVIDER_SITE_OTHER): Payer: Self-pay

## 2021-03-27 NOTE — Telephone Encounter (Signed)
-----   Message from Kerin Perna, NP sent at 03/25/2021  8:29 PM EDT ----- Labs are normal except glucose was low probably associated with fasting.

## 2021-03-27 NOTE — Telephone Encounter (Signed)
Patient was not available. Per DPR normal results given to his aunt [place]. She will inform patient. Nat Christen, CMA

## 2021-04-10 ENCOUNTER — Other Ambulatory Visit: Payer: Self-pay

## 2021-04-10 ENCOUNTER — Ambulatory Visit (INDEPENDENT_AMBULATORY_CARE_PROVIDER_SITE_OTHER): Payer: Medicare Other | Admitting: Primary Care

## 2021-04-10 ENCOUNTER — Encounter (INDEPENDENT_AMBULATORY_CARE_PROVIDER_SITE_OTHER): Payer: Self-pay | Admitting: Primary Care

## 2021-04-10 VITALS — BP 130/84 | HR 94 | Temp 98.1°F | Ht 66.0 in | Wt 179.2 lb

## 2021-04-10 DIAGNOSIS — Z0130 Encounter for examination of blood pressure without abnormal findings: Secondary | ICD-10-CM

## 2021-04-10 DIAGNOSIS — H6122 Impacted cerumen, left ear: Secondary | ICD-10-CM | POA: Diagnosis not present

## 2021-04-10 NOTE — Progress Notes (Signed)
Portal is a 60 y.o. male presents for hypertension evaluation, Denies shortness of breath, headaches, chest pain or lower extremity edema, sudden onset, vision changes, unilateral weakness, dizziness, paresthesias   Patient reports adherence with medications.  Dietary habits include: low sodium diet  Exercise habits include:continues to do home PT Family / Social history: Mother/ Grandmother/Sister MI   Past Medical History:  Diagnosis Date   Diabetes mellitus    Hypertension    Stroke [facility])    Past Surgical History:  Procedure Laterality Date   TEE WITHOUT CARDIOVERSION N/A 05/03/2014   Procedure: TRANSESOPHAGEAL ECHOCARDIOGRAM (TEE);  Surgeon: Pixie Casino, MD;  Location: [facility] ENDOSCOPY;  Service: Cardiovascular;  Laterality: N/A;   No Known Allergies Current Outpatient Medications on File Prior to Visit  Medication Sig Dispense Refill   amLODipine (NORVASC) 10 MG tablet Take 1 tablet (10 mg total) by mouth daily. 90 tablet 1   ASPIRIN LOW DOSE 81 MG EC tablet Take 81 mg by mouth daily.     cloNIDine (CATAPRES - DOSED IN MG/24 HR) 0.2 mg/24hr patch Place 1 patch (0.2 mg total) onto the skin once a week. 4 patch 12   clopidogrel (PLAVIX) 75 MG tablet Take 75 mg by mouth daily.     insulin glargine (LANTUS SOLOSTAR) 100 UNIT/ML Solostar Pen Inject 10 Units into the skin daily. 15 mL PRN   pravastatin (PRAVACHOL) 40 MG tablet Take 40 mg by mouth daily.     triamterene-hydrochlorothiazide (MAXZIDE-25) 37.5-25 MG tablet Take 1 tablet by mouth daily. 90 tablet 1   Current Facility-Administered Medications on File Prior to Visit  Medication Dose Route Frequency Provider Last Rate Last Admin   0.9 %  sodium chloride infusion  500 mL Intravenous Once Danis, Kirke Corin, MD       Social History   Socioeconomic History   Marital status: Divorced    Spouse name: Not on file   Number of children: 2   Years of education: 12   Highest  education level: Not on file  Occupational History   Not on file  Tobacco Use   Smoking status: Never   Smokeless tobacco: Never  Vaping Use   Vaping Use: Never used  Substance and Sexual Activity   Alcohol use: No    Alcohol/week: 0.0 standard drinks   Drug use: No   Sexual activity: Not on file  Other Topics Concern   Not on file  Social History Narrative   Patient is single and has 2 children.   Patient is right handed.   Patient has hs education.   Patient drinks diet caffeine free sodas daily.   Social Determinants of Health   Financial Resource Strain: Not on file  Food Insecurity: Not on file  Transportation Needs: Not on file  Physical Activity: Not on file  Stress: Not on file  Social Connections: Not on file  Intimate Partner Violence: Not on file   Family History  Problem Relation Age of Onset   Cervical cancer Mother    Hypertension Sister    Diabetes Mellitus II Maternal Uncle    Colon cancer Maternal Aunt      OBJECTIVE:  BP 130/84   Pulse 94   Temp 98.1 F (36.7 C) (Temporal)   Ht 5\' 6"  (1.676 m)   Wt 179 lb 3.2 oz (81.3 kg)   SpO2 94%   BMI 28.92 kg/m    Physical Exam Vitals reviewed.  HENT:  Head: Normocephalic.     Right Ear: External ear normal.     Left Ear: External ear normal.     Nose: Nose normal.  Eyes:     Extraocular Movements: Extraocular movements intact.  Cardiovascular:     Rate and Rhythm: Normal rate and regular rhythm.  Pulmonary:     Effort: Pulmonary effort is normal.     Breath sounds: Normal breath sounds.  Abdominal:     General: Bowel sounds are normal. There is distension.     Palpations: Abdomen is soft.  Musculoskeletal:     Cervical back: Normal range of motion.  Skin:    General: Skin is warm and dry.  Neurological:     Mental Status: He is alert and oriented to person, place, and time.     Motor: Weakness present.     Gait: Gait abnormal.  Psychiatric:        Mood and Affect: Mood normal.         Behavior: Behavior normal.        Thought Content: Thought content normal.        Judgment: Judgment normal.    Review of Systems  Musculoskeletal:        Decrease ROM right side   Neurological:        Right side (CVA)  All other systems reviewed and are negative.  Last 3 Office BP readings: BP Readings from Last 3 Encounters:  03/23/21 (!) 144/79  12/22/19 (!) 150/74  11/11/19 (!) 170/80    BMET    Component Value Date/Time   NA 141 03/23/2021 1014   K 4.0 03/23/2021 1014   CL 102 03/23/2021 1014   CO2 25 03/23/2021 1014   GLUCOSE 55 (L) 03/23/2021 1014   GLUCOSE 246 (H) 11/17/2015 1651   BUN 12 03/23/2021 1014   CREATININE 1.11 03/23/2021 1014   CALCIUM 9.4 03/23/2021 1014   GFRNONAA >60 11/17/2015 1651   GFRAA >60 11/17/2015 1651    Renal function: CrCl cannot be calculated (Unknown ideal weight.).  Clinical ASCVD: Yes  The ASCVD Risk score (Arnett DK, et al., 2019) failed to calculate for the following reasons:   The patient has a prior MI or stroke diagnosis  ASCVD risk factors include- [place]   ASSESSMENT & PLAN:  Huriel was seen today for blood pressure check.  Diagnoses and all orders for this visit:  Blood pressure check -Counseled on lifestyle modifications for blood pressure control including reduced dietary sodium, increased exercise, weight reduction and adequate sleep. Also, educated patient about the risk for cardiovascular events, stroke and heart attack. Also counseled patient about the importance of medication adherence. If you participate in smoking, it is important to stop using tobacco as this will increase the risks associated with uncontrolled blood pressure.   -Hypertension longstanding diagnosed currently c on current medications. Patient is adherent with current medications.   Goal BP:  For patients younger than 60: Goal BP < 130/80. For patients 60 and older: Goal BP < 140/90. For patients with diabetes: Goal BP < 130/80. Your  most recent BP: 130/84  Minimize salt intake. Minimize alcohol intake  Left ear impacted cerumen PROCEDURE: CERUMEN DISIMPACTION   The patient had a large amount of cerumen in the external auditory canal(s): left Ear wax softener was used prior to the lavage. Ear lavage was performed on left ear(s) Curettage by provider was not performed in addition.  There were no complications and following the disimpaction the tympanic membrane was visible.  Charges to be entered in Charge Capture section.    This note has been created with Surveyor, quantity. Any transcriptional errors are unintentional.   Kerin Perna, NP 04/10/2021, 2:26 PM

## 2021-04-20 ENCOUNTER — Other Ambulatory Visit: Payer: Self-pay

## 2021-04-20 ENCOUNTER — Encounter: Payer: Self-pay | Admitting: Pharmacist

## 2021-04-20 ENCOUNTER — Ambulatory Visit: Payer: Medicare Other | Attending: Primary Care | Admitting: Pharmacist

## 2021-04-20 VITALS — BP 152/72

## 2021-04-20 DIAGNOSIS — E1169 Type 2 diabetes mellitus with other specified complication: Secondary | ICD-10-CM | POA: Diagnosis not present

## 2021-04-20 DIAGNOSIS — I639 Cerebral infarction, unspecified: Secondary | ICD-10-CM

## 2021-04-20 DIAGNOSIS — Z794 Long term (current) use of insulin: Secondary | ICD-10-CM

## 2021-04-20 LAB — GLUCOSE, POCT (MANUAL RESULT ENTRY): POC Glucose: 197 mg/dl — AB (ref 70–99)

## 2021-04-20 NOTE — Progress Notes (Signed)
    S:    PCP: Sharyn Lull   No chief complaint on file.  Patient arrives in good spirits. Presents for diabetes evaluation, education, and management. Patient was referred and last seen by Primary Care Provider on 03/23/2021. A1c at that visit was 6.9. BP was 144/79.   Family/Social History:  -HTN, DM -Tobacco: never smoker  -Alcohol: none reported   Insurance coverage/medication affordability: Medicare   Medication adherence reported.   Current diabetes medications include: Lantus 10 units daily Current hypertension medications include: amlodipine 10 mg daily, clonidine transdermal 0.2 mg/24hr, Maxzide 25-37.5 mg daily  Current hyperlipidemia medications include: pravastatin 40 mg daily   Patient denies hypoglycemic events.  Patient reported dietary habits:  - Reports that he mostly adheres to a diabetic diet.  - He is somewhat of a poor historian   Patient-reported exercise habits:  - Uses a treadmill at home along with arm and core exercises as tolerable  - Unable to specify amount of time spent exercising.    Patient denies nocturia (nighttime urination).  Patient denies neuropathy (nerve pain). Patient denies visual changes. Patient reports self foot exams.     O:  Lab Results  Component Value Date   HGBA1C 6.9 (A) 03/23/2021   There were no vitals filed for this visit.  Lipid Panel     Component Value Date/Time   CHOL 143 03/23/2021 1014   TRIG 39 03/23/2021 1014   HDL 47 03/23/2021 1014   CHOLHDL 3.0 03/23/2021 1014   CHOLHDL 4.8 04/30/2014 0015   VLDL 18 04/30/2014 0015   LDLCALC 87 03/23/2021 1014    Home fasting blood sugars: does not check at home   Clinical Atherosclerotic Cardiovascular Disease (ASCVD): Yes  The ASCVD Risk score (Arnett DK, et al., 2019) failed to calculate for the following reasons:   The patient has a prior MI or stroke diagnosis   A/P: Diabetes longstanding currently controlled. Patient is able to verbalize appropriate  hypoglycemia management plan. Medication adherence appears appropriate. CBG in clinic is elevated today but patient ate a serving of grits ~30 minutes before this appointment. Would prefer to use agent with ASCVD data given his stroke history but pt declines modification of his regimen at this time.  -Continued current regimen. -Extensively discussed pathophysiology of diabetes, recommended lifestyle interventions, dietary effects on blood sugar control -Counseled on s/sx of and management of hypoglycemia -Next A1C anticipated 12/22.   ASCVD risk - secondary prevention in patient with diabetes. Last LDL is not controlled. high intensity statin indicated. Priority given to BP and DM today. Will address statin intensification at follow-up visit. He has not been seen by Neurology in some time. Last visit in our system was 07/08/2014. At that time patient was on atorvastatin. Unsure why this was switched to pravastatin.  -Continued pravastatin 40 mg daily for now.    Hypertension longstanding currently above goal. BP check  04/10/2021 was good (130/84 mmHg). Blood pressure goal = 130/80 mmHg. Medication adherence reported. Will hold off on changes.  -Encouraged patient to check BP at home if able  -Continue current regimen for now.   Written patient instructions provided.  Total time in face to face counseling 30 minutes.   Follow up Pharmacist Clinic Visit in 1 month.  Benard Halsted, PharmD, [facility] [place], [place] [phone]

## 2021-04-22 ENCOUNTER — Emergency Department (HOSPITAL_COMMUNITY): Payer: No Typology Code available for payment source

## 2021-04-22 ENCOUNTER — Encounter (HOSPITAL_COMMUNITY): Payer: Self-pay | Admitting: Emergency Medicine

## 2021-04-22 ENCOUNTER — Other Ambulatory Visit: Payer: Self-pay

## 2021-04-22 ENCOUNTER — Emergency Department (HOSPITAL_COMMUNITY)
Admission: EM | Admit: 2021-04-22 | Discharge: 2021-04-22 | Disposition: A | Discharge status: Home / Self Care | Payer: No Typology Code available for payment source | Attending: Emergency Medicine | Admitting: Emergency Medicine

## 2021-04-22 DIAGNOSIS — M5020 Other cervical disc displacement, unspecified cervical region: Secondary | ICD-10-CM

## 2021-04-22 DIAGNOSIS — M5021 Other cervical disc displacement,  high cervical region: Secondary | ICD-10-CM | POA: Insufficient documentation

## 2021-04-22 DIAGNOSIS — R109 Unspecified abdominal pain: Secondary | ICD-10-CM | POA: Insufficient documentation

## 2021-04-22 DIAGNOSIS — R519 Headache, unspecified: Secondary | ICD-10-CM | POA: Diagnosis not present

## 2021-04-22 DIAGNOSIS — M79601 Pain in right arm: Secondary | ICD-10-CM | POA: Insufficient documentation

## 2021-04-22 DIAGNOSIS — I10 Essential (primary) hypertension: Secondary | ICD-10-CM | POA: Diagnosis not present

## 2021-04-22 DIAGNOSIS — Z794 Long term (current) use of insulin: Secondary | ICD-10-CM | POA: Insufficient documentation

## 2021-04-22 DIAGNOSIS — Z7902 Long term (current) use of antithrombotics/antiplatelets: Secondary | ICD-10-CM | POA: Insufficient documentation

## 2021-04-22 DIAGNOSIS — R0789 Other chest pain: Secondary | ICD-10-CM | POA: Diagnosis not present

## 2021-04-22 DIAGNOSIS — Y92410 Unspecified street and highway as the place of occurrence of the external cause: Secondary | ICD-10-CM | POA: Insufficient documentation

## 2021-04-22 DIAGNOSIS — E119 Type 2 diabetes mellitus without complications: Secondary | ICD-10-CM | POA: Diagnosis not present

## 2021-04-22 DIAGNOSIS — Z79899 Other long term (current) drug therapy: Secondary | ICD-10-CM | POA: Insufficient documentation

## 2021-04-22 DIAGNOSIS — Z7982 Long term (current) use of aspirin: Secondary | ICD-10-CM | POA: Insufficient documentation

## 2021-04-22 LAB — CBC WITH DIFFERENTIAL/PLATELET
Abs Immature Granulocytes: 0.01 10*3/uL (ref 0.00–0.07)
Basophils Absolute: 0 10*3/uL (ref 0.0–0.1)
Basophils Relative: 1 %
Eosinophils Absolute: 0.1 10*3/uL (ref 0.0–0.5)
Eosinophils Relative: 1 %
HCT: 41.1 % (ref 39.0–52.0)
Hemoglobin: 13.7 g/dL (ref 13.0–17.0)
Immature Granulocytes: 0 %
Lymphocytes Relative: 35 %
Lymphs Abs: 2.3 10*3/uL (ref 0.7–4.0)
MCH: 29.1 pg (ref 26.0–34.0)
MCHC: 33.3 g/dL (ref 30.0–36.0)
MCV: 87.3 fL (ref 80.0–100.0)
Monocytes Absolute: 0.5 10*3/uL (ref 0.1–1.0)
Monocytes Relative: 8 %
Neutro Abs: 3.6 10*3/uL (ref 1.7–7.7)
Neutrophils Relative %: 55 %
Platelets: 331 10*3/uL (ref 150–400)
RBC: 4.71 MIL/uL (ref 4.22–5.81)
RDW: 13.8 % (ref 11.5–15.5)
WBC: 6.5 10*3/uL (ref 4.0–10.5)
nRBC: 0 % (ref 0.0–0.2)

## 2021-04-22 LAB — BASIC METABOLIC PANEL
Anion gap: 12 (ref 5–15)
BUN: 22 mg/dL — ABNORMAL HIGH (ref 6–20)
CO2: 27 mmol/L (ref 22–32)
Calcium: 10.3 mg/dL (ref 8.9–10.3)
Chloride: 100 mmol/L (ref 98–111)
Creatinine, Ser: 1.32 mg/dL — ABNORMAL HIGH (ref 0.61–1.24)
GFR, Estimated: >60 mL/min (ref >60)
Glucose, Bld: 81 mg/dL (ref 70–99)
Potassium: 3.7 mmol/L (ref 3.5–5.1)
Sodium: 139 mmol/L (ref 135–145)

## 2021-04-22 LAB — I-STAT CHEM 8, ED
BUN: 20 mg/dL (ref 6–20)
Calcium, Ion: 1.21 mmol/L (ref 1.15–1.40)
Chloride: 99 mmol/L (ref 98–111)
Creatinine, Ser: 1.3 mg/dL — ABNORMAL HIGH (ref 0.61–1.24)
Glucose, Bld: 85 mg/dL (ref 70–99)
HCT: 43 % (ref 39.0–52.0)
Hemoglobin: 14.6 g/dL (ref 13.0–17.0)
Potassium: 3.5 mmol/L (ref 3.5–5.1)
Sodium: 138 mmol/L (ref 135–145)
TCO2: 29 mmol/L (ref 22–32)

## 2021-04-22 MED ORDER — OXYCODONE HCL 5 MG PO TABS: 5.0000 mg | ORAL_TABLET | ORAL | 0 refills | Status: DC | PRN

## 2021-04-22 MED ORDER — IOHEXOL 350 MG/ML SOLN
80.0000 mL | Freq: Once | INTRAVENOUS | Status: AC | PRN
  Administered 2021-04-22: 80 mL via INTRAVENOUS

## 2021-04-22 MED ORDER — HYDROCODONE-ACETAMINOPHEN 5-325 MG PO TABS
1.0000 | ORAL_TABLET | Freq: Once | ORAL | Status: AC
Start: 2021-04-22 — End: 2021-04-22
  Administered 2021-04-22: 1 via ORAL
  Filled 2021-04-22: qty 1
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
## 2021-04-22 NOTE — Discharge Instructions (Addendum)
Call the neurosurgeon listed on your discharge paperwork for reevaluation of your neck pain.  Keep the cervical collar on at all times.  Take the pain medication as prescribed.  This is an opiate pain medication may become addictive.  Do not drive or operate heavy machinery while taking this medication

## 2021-04-24 ENCOUNTER — Encounter (INDEPENDENT_AMBULATORY_CARE_PROVIDER_SITE_OTHER): Payer: Self-pay | Admitting: Primary Care

## 2021-04-24 ENCOUNTER — Ambulatory Visit (INDEPENDENT_AMBULATORY_CARE_PROVIDER_SITE_OTHER): Payer: Medicare Other | Admitting: Primary Care

## 2021-04-24 ENCOUNTER — Other Ambulatory Visit: Payer: Self-pay

## 2021-04-24 VITALS — BP 131/80 | HR 87 | Temp 97.9°F | Ht 66.0 in | Wt 179.8 lb

## 2021-04-24 DIAGNOSIS — H6122 Impacted cerumen, left ear: Secondary | ICD-10-CM

## 2021-04-24 NOTE — Progress Notes (Signed)
[facility]   Established Patient Office Visit  Subjective:  [name], male    DOB: October 25, 1960  Age: 60 y.o. MRN: [id number]  CC:  Chief Complaint  Patient presents with   Cerumen Impaction    With irrigation     HPI Adam Callahan presents for left ear irrigation /cerumen impaction   Past Medical History:  Diagnosis Date   Diabetes mellitus    Hypertension    Stroke [facility])     Past Surgical History:  Procedure Laterality Date   TEE WITHOUT CARDIOVERSION N/A 05/03/2014   Procedure: TRANSESOPHAGEAL ECHOCARDIOGRAM (TEE);  Surgeon: Adam Casino, MD;  Location: [facility] ENDOSCOPY;  Service: Cardiovascular;  Laterality: N/A;    Family History  Problem Relation Age of Onset   Cervical cancer Mother    Hypertension Sister    Diabetes Mellitus II Maternal Uncle    Colon cancer Maternal Aunt     Social History   Socioeconomic History   Marital status: Divorced    Spouse name: Not on file   Number of children: 2   Years of education: 12   Highest education level: Not on file  Occupational History   Not on file  Tobacco Use   Smoking status: Never   Smokeless tobacco: Never  Vaping Use   Vaping Use: Never used  Substance and Sexual Activity   Alcohol use: No    Alcohol/week: 0.0 standard drinks   Drug use: No   Sexual activity: Not on file  Other Topics Concern   Not on file  Social History Narrative   Patient is single and has 2 children.   Patient is right handed.   Patient has hs education.   Patient drinks diet caffeine free sodas daily.   Social Determinants of Health   Financial Resource Strain: Not on file  Food Insecurity: Not on file  Transportation Needs: Not on file  Physical Activity: Not on file  Stress: Not on file  Social Connections: Not on file  Intimate Partner Violence: Not on file    Outpatient Medications Prior to Visit  Medication Sig Dispense Refill   amLODipine (NORVASC) 10 MG tablet Take  1 tablet (10 mg total) by mouth daily. 90 tablet 1   ASPIRIN LOW DOSE 81 MG EC tablet Take 81 mg by mouth daily.     cloNIDine (CATAPRES - DOSED IN MG/24 HR) 0.2 mg/24hr patch Place 1 patch (0.2 mg total) onto the skin once a week. 4 patch 12   clopidogrel (PLAVIX) 75 MG tablet Take 75 mg by mouth daily.     insulin glargine (LANTUS SOLOSTAR) 100 UNIT/ML Solostar Pen Inject 10 Units into the skin daily. 15 mL PRN   oxyCODONE (ROXICODONE) 5 MG immediate release tablet Take 1 tablet (5 mg total) by mouth every 4 (four) hours as needed for severe pain. 15 tablet 0   pravastatin (PRAVACHOL) 40 MG tablet Take 40 mg by mouth daily.     triamterene-hydrochlorothiazide (MAXZIDE-25) 37.5-25 MG tablet Take 1 tablet by mouth daily. 90 tablet 1   Facility-Administered Medications Prior to Visit  Medication Dose Route Frequency Provider Last Rate Last Admin   0.9 %  sodium chloride infusion  500 mL Intravenous Once Adam Meuse III, MD        No Known Allergies  ROS Comprehensive review of system is negative except for hard of hearing and left ear cerumen impaction   Objective:  BP 131/80 (BP Location: Right Arm, Patient  Position: Sitting, Cuff Size: Normal)   Pulse 87   Temp 97.9 F (36.6 C) (Temporal)   Ht 5' 6"  (1.676 m)   Wt 179 lb 12.8 oz (81.6 kg)   SpO2 97%   BMI 29.02 kg/m  Wt Readings from Last 3 Encounters:  04/24/21 179 lb 12.8 oz (81.6 kg)  04/22/21 179 lb 3.2 oz (81.3 kg)  04/10/21 179 lb 3.2 oz (81.3 kg)  Physical Exam Vitals reviewed.  Constitutional:      Appearance: He is obese.  HENT:     Right Ear: Tympanic membrane and external ear normal.     Left Ear: There is impacted cerumen.  Eyes:     Extraocular Movements: Extraocular movements intact.  Cardiovascular:     Rate and Rhythm: Normal rate and regular rhythm.  Pulmonary:     Effort: Pulmonary effort is normal.     Breath sounds: Normal breath sounds.  Abdominal:     General: Bowel sounds are normal. There is  distension.     Palpations: Abdomen is soft.  Skin:    General: Skin is warm and dry.  Neurological:     Mental Status: He is alert and oriented to person, place, and time.  Psychiatric:        Mood and Affect: Mood normal.        Behavior: Behavior normal.        Thought Content: Thought content normal.        Judgment: Judgment normal.    Health Maintenance Due  Topic Date Due   FOOT EXAM  Never done   OPHTHALMOLOGY EXAM  Never done   COVID-19 Vaccine (3 - Booster for Pfizer series) 05/07/2020   COLONOSCOPY (Pts 45-23yr Insurance coverage will need to be confirmed)  12/21/2020    There are no preventive care reminders to display for this patient.  Lab Results  Component Value Date   TSH 0.478 06/06/2013   Lab Results  Component Value Date   WBC 6.5 04/22/2021   HGB 14.6 04/22/2021   HCT 43.0 04/22/2021   MCV 87.3 04/22/2021   PLT 331 04/22/2021   Lab Results  Component Value Date   NA 138 04/22/2021   K 3.5 04/22/2021   CO2 27 04/22/2021   GLUCOSE 85 04/22/2021   BUN 20 04/22/2021   CREATININE 1.30 (H) 04/22/2021   BILITOT 0.4 03/23/2021   ALKPHOS 64 03/23/2021   AST 19 03/23/2021   ALT 18 03/23/2021   PROT 7.9 03/23/2021   ALBUMIN 4.5 03/23/2021   CALCIUM 10.3 04/22/2021   ANIONGAP 12 04/22/2021   EGFR 77 03/23/2021   Lab Results  Component Value Date   CHOL 143 03/23/2021   Lab Results  Component Value Date   HDL 47 03/23/2021   Lab Results  Component Value Date   LDLCALC 87 03/23/2021   Lab Results  Component Value Date   TRIG 39 03/23/2021   Lab Results  Component Value Date   CHOLHDL 3.0 03/23/2021   Lab Results  Component Value Date   HGBA1C 6.9 (A) 03/23/2021      Assessment & Plan:  [name] seen today for cerumen impaction.  Diagnoses and all orders for this visit:  Left ear impacted cerumen The patient had a large amount of cerumen in the external auditory canal(s): left ear  Ear wax softener was used prior to the  lavage. Ear lavage was performed on bilateral ear(s)  Curettage by provider was not performed in addition.  There  were no complications and following the disimpaction the tympanic membrane was visible. Charges to be entered in Charge Capture section.   Follow-up: Return in about 2 months (around 06/28/2021) for DM/HTN.    Kerin Perna, NP

## 2021-04-24 NOTE — Patient Instructions (Signed)
Ear Irrigation Ear irrigation is a procedure to wash dirt and wax out of your ear canal. This procedure is also called lavage. You may need ear irrigation if you are having trouble hearing because of a buildup of earwax. You may also have ear irrigation as part of the treatment for an ear infection. Getting wax and dirt out of your ear canal can help ear drops work better. Tell a health care provider about: Any allergies you have. All medicines you are taking, including vitamins, herbs, eye drops, creams, and over-the-counter medicines. Any problems you or family members have had with anesthetic medicines. Any blood disorders you have. Any surgeries you have had. This includes any ear surgeries. Any medical conditions you have. Whether you are pregnant or may be pregnant. What are the risks? Generally, this is a safe procedure. However, problems may occur, including: Infection. Pain. Hearing loss. Fluid and debris being pushed through the eardrum and into the middle ear. This can occur if there are holes in the eardrum. Ear irrigation failing to work. What happens before the procedure? You will talk with your provider about the procedure and plan. You may be given ear drops to put in your ear 15-20 minutes before irrigation. This helps loosen the wax. What happens during the procedure?  A syringe is filled with water or saline solution, which is made of salt and water. The syringe is gently inserted into the ear canal. The fluid is used to flush out wax and other debris. The procedure may vary among health care providers and hospitals. What can I expect after the procedure? After an ear irrigation, follow instructions given to you by your health care provider. Follow these instructions at home: Using ear irrigation kits Ear irrigation kits are available for use at home. Ask your health care provider if this is an option for you. In general, you should: Use a home irrigation kit only as  told by your health care provider. Read the package instructions carefully. Follow the directions for using the syringe. Use water that is room temperature. Do not do ear irrigation at home if you: Have diabetes. Diabetes increases the risk of infection. Have a hole or tear in your eardrum. Have tubes in your ears. Have had any ear surgery in the past. Have been told not to irrigate your ears. Cleaning your ears  Clean the outside of your ear with a soft washcloth daily. If told by your health care provider, use a few drops of baby oil, mineral oil, glycerin, hydrogen peroxide, or over-the-counter earwax softening drops. Do not use cotton swabs to clean your ears. These can push wax down into the ear canal. Do not put anything into your ears to try to remove wax. This includes ear candles. General instructions Take over-the-counter and prescription medicines only as told by your health care provider. If you were prescribed an antibiotic medicine, use it as told by your health care provider. Do not stop using the antibiotic even if your condition improves. Keep the ear clean and dry by following the instructions from your health care provider. Keep all follow-up visits. This is important. Visit your health care provider at least once a year to have your ears and hearing checked. Contact a health care provider if: Your hearing is not improving or is getting worse. You have pain or redness in your ear. You are dizzy. You have ringing in your ears. You have nausea or vomiting. You have fluid, blood, or pus coming out  your ear. Summary Ear irrigation is a procedure to wash dirt and wax out of your ear canal. This procedure is also called lavage. To perform ear irrigation, ear drops may be put in your ear 15-20 minutes before irrigation. Water or saline solution will be used to flush out earwax and other debris. You may be able to irrigate your ears at home. Ask your health care  provider if this is an option for you. Follow your health care provider's instructions. Clean your ears with a soft cloth after irrigation. Do not use cotton swabs to clean your ears. These can push wax down into the ear canal. This information is not intended to replace advice given to you by your health care provider. Make sure you discuss any questions you have with your healthcare provider. Document Revised: 10/26/2019 Document Reviewed: 10/26/2019 Elsevier Patient Education  2022 Elsevier Inc.  

## 2021-05-03 ENCOUNTER — Encounter: Payer: Self-pay | Admitting: Gastroenterology

## 2021-05-16 ENCOUNTER — Ambulatory Visit: Payer: Self-pay | Admitting: *Deleted

## 2021-05-16 NOTE — Telephone Encounter (Signed)
Pt transferred to triage after cancelling appt Friday for med review. Pt states he had not been taking meds because he feels his Aunt (Guardian) has been switching meds "Or giving me too uch." Pt evasive historian. Reviewed all meds with pt. Eventually stated he is taking all meds except pain meds "makes me go out of my head." Later in conversation states he is not taking any meds. "I have them all in a bag for you to go over with me." States his Elenor Legato keeps them at her house "Why would she do that." Pt had appt Friday and cancelled due to transportation issue. Agent sent request prior to triage to [place]. Request sent to practice. Please advise: [phone]       Reason for Disposition  [1] Caller has URGENT medicine question about med that PCP or specialist prescribed AND [2] triager unable to answer question  Answer Assessment - Initial Assessment Questions 1. NAME of MEDICATION: "What medicine are you calling about?"     All. Please see summary. 2. QUESTION: "What is your question?" (e.g., double dose of medicine, side effect)     *No Answer* 3. PRESCRIBING HCP: "Who prescribed it?" Reason: if prescribed by specialist, call should be referred to that group.     *No Answer* 4. SYMPTOMS: "Do you have any symptoms?"     *No Answer* 5. SEVERITY: If symptoms are present, ask "Are they mild, moderate or severe?"     *No Answer* 6. PREGNANCY:  "Is there any chance that you are pregnant?" "When was your last menstrual period?"     *No Answer*  Protocols used: Medication Question Call-A-AH

## 2021-05-16 NOTE — Telephone Encounter (Signed)
Sent to PCP ?

## 2021-05-18 ENCOUNTER — Ambulatory Visit: Payer: Medicare Other | Admitting: Pharmacist

## 2021-05-18 ENCOUNTER — Other Ambulatory Visit: Payer: Self-pay

## 2021-05-21 ENCOUNTER — Ambulatory Visit: Payer: Medicare Other | Attending: Primary Care | Admitting: Pharmacist

## 2021-05-21 ENCOUNTER — Other Ambulatory Visit: Payer: Self-pay

## 2021-05-21 ENCOUNTER — Encounter: Payer: Self-pay | Admitting: Pharmacist

## 2021-05-21 DIAGNOSIS — Z131 Encounter for screening for diabetes mellitus: Secondary | ICD-10-CM | POA: Diagnosis not present

## 2021-05-21 DIAGNOSIS — Z8639 Personal history of other endocrine, nutritional and metabolic disease: Secondary | ICD-10-CM

## 2021-05-21 MED ORDER — ATORVASTATIN CALCIUM 40 MG PO TABS: 40.0000 mg | ORAL_TABLET | Freq: Every day | ORAL | 3 refills | Status: DC

## 2021-05-21 MED ORDER — LANTUS SOLOSTAR 100 UNIT/ML ~~LOC~~ SOPN: 36.0000 [IU] | PEN_INJECTOR | Freq: Every day | SUBCUTANEOUS | 99 refills | Status: DC

## 2021-05-21 MED ORDER — CLONIDINE HCL 0.2 MG PO TABS: 0.2000 mg | ORAL_TABLET | Freq: Two times a day (BID) | ORAL | 1 refills | Status: DC

## 2021-05-21 MED ORDER — NOVOLOG FLEXPEN 100 UNIT/ML ~~LOC~~ SOPN: 6.0000 [IU] | PEN_INJECTOR | Freq: Three times a day (TID) | SUBCUTANEOUS | 2 refills | Status: DC

## 2021-05-21 NOTE — Progress Notes (Signed)
S:    PCP: Sharyn Lull   No chief complaint on file.  Patient arrives in good spirits. Presents for diabetes evaluation, education, and management. Patient was referred and last seen by Primary Care Provider on 03/23/2021. A1c at that visit was 6.9%. I saw him on 04/20/2021.   Today, pt is upset. He expresses frustration with someone not telling him his insurance did not cover Trulicity. Additionally, he requests to change from clonidine patch to clonidine tablets. He is not checking his CBGs or BP at home but reports "feeling fine". Denies any polyuria, polydipsia. Denies blurred visions, changes in neuropathy. r  Family/Social History:  -HTN, DM -Tobacco: never smoker  -Alcohol: none reported   Insurance coverage/medication affordability: Medicare   Medication adherence reported.   Current diabetes medications include: Lantus 10 units daily (pt is taking 36 units daily), He continues to take Novolog 6 units TID before meals; never started Trulicity because of insurance coverage  Current hypertension medications include: amlodipine 10 mg daily, clonidine transdermal 0.2 mg/24hr, Maxzide 25-37.5 mg daily  Current hyperlipidemia medications include: pravastatin 40 mg daily   Patient denies hypoglycemic events.  Patient reported dietary habits:  - Reports that he mostly adheres to a diabetic diet.  - He is somewhat of a poor historian   Patient-reported exercise habits:  - Uses a treadmill at home along with arm and core exercises as tolerable  - Unable to specify amount of time spent exercising.    Patient denies nocturia (nighttime urination).  Patient denies neuropathy (nerve pain). Patient denies visual changes. Patient reports self foot exams.     O:  Lab Results  Component Value Date   HGBA1C 6.9 (A) 03/23/2021   Vitals:   05/21/21 1437  BP: (!) 178/98    Lipid Panel     Component Value Date/Time   CHOL 143 03/23/2021 1014   TRIG 39 03/23/2021 1014   HDL 47  03/23/2021 1014   CHOLHDL 3.0 03/23/2021 1014   CHOLHDL 4.8 04/30/2014 0015   VLDL 18 04/30/2014 0015   LDLCALC 87 03/23/2021 1014    Home fasting blood sugars: does not check at home   Clinical Atherosclerotic Cardiovascular Disease (ASCVD): Yes  The ASCVD Risk score (Arnett DK, et al., 2019) failed to calculate for the following reasons:   The patient has a prior MI or stroke diagnosis   A/P: Diabetes longstanding currently controlled based on A1c. Patient is able to verbalize appropriate hypoglycemia management plan. Medication adherence appears appropriate. He prefers to try Trulicity, however, this is not covered. I will update in the system that he continues to take Lantus and Novolog.  -Continued current regimen. -Extensively discussed pathophysiology of diabetes, recommended lifestyle interventions, dietary effects on blood sugar control -Counseled on s/sx of and management of hypoglycemia -Next A1C anticipated 12/22.   ASCVD risk - secondary prevention in patient with diabetes. Last LDL is not controlled. high intensity statin indicated.  -Discontinue pravastatin.  -Start atorvastatin 40 mg daily.   Hypertension longstanding currently above goal.He is upset today and believes this is elevating his blood pressure. Blood pressure goal = 130/80 mmHg. Medication adherence reported. Will change clonidine patch to tablets per pt request and recheck in 1 month.  -Stop clonidine transdermal.  -Start clonidine 0.2 mg BID.  -Continue other antihypertensives.   Written patient instructions provided.  Total time in face to face counseling 30 minutes.   Follow up Pharmacist Clinic Visit in 1 month.  Benard Halsted, PharmD, BCACP, CPP  [place] [phone]

## 2021-05-28 ENCOUNTER — Telehealth (INDEPENDENT_AMBULATORY_CARE_PROVIDER_SITE_OTHER): Payer: Self-pay | Admitting: Primary Care

## 2021-05-28 NOTE — Telephone Encounter (Signed)
Pt aunt Brennin Durfee [phone] states that she missed a call from the LCSW. Please advise

## 2021-05-28 NOTE — Telephone Encounter (Signed)
Routed to LCSW

## 2021-05-30 NOTE — Telephone Encounter (Signed)
Adam Callahan calling this as states she missed a call from you.

## 2021-06-08 NOTE — Telephone Encounter (Signed)
I spoke with this pt and he is not interested in [facility] support.

## 2021-06-18 NOTE — Progress Notes (Signed)
S:    PCP: Michelle   Patient arrives in good spirits. Presents for diabetes evaluation, education, and management. Patient was referred and last seen by Primary Care Provider on 03/23/2021. A1c at that visit was 6.9%. Lurena Joiner saw him on 04/20/2021 & 05/21/21. Continued current DM meds but changed clonidine patch to tablets per patient's request. Pravastatin was also changed to high-intensity atorvastatin given LDL not at goal. He wanted to start Trulicity but stated his insurance did not cover it.   Today, patient arrives in good spirits accompanied by a family member. Reports he is doing well on his medications. He is not checking his BP or BG at home. Denies dizziness, headaches, blurred vision, chest pain. Denies symptoms of low BG. They called his aunt during the visit who stated that he often does not take his evening medications (clonidine). He endorses compliance to all of his insulin injections.   Family/Social History:  -HTN, DM -Tobacco: never smoker  -Alcohol: none reported   Insurance coverage/medication affordability: Medicare   Medication adherence reported.   Current diabetes medications include: Lantus 36 units daily, Novolog 6 units TID before meals; never started Trulicity because of insurance coverage  Current hypertension medications include: amlodipine 10 mg daily, clonidine 0.2 mg BID, Maxzide 25-37.5 mg daily  Current hyperlipidemia medications include: atorvastatin 40 mg daily  Patient denies hypoglycemic events.  Patient reported dietary habits:  - Reports that he mostly adheres to a diabetic diet.  - He is somewhat of a poor historian   Patient-reported exercise habits:  - Uses a treadmill at home along with arm and core exercises as tolerable  - Unable to specify amount of time spent exercising.   Patient denies nocturia (nighttime urination).  Patient denies neuropathy (nerve pain). Patient denies visual changes. Patient reports self foot exams.    O:   POCT BG: 269 (ate 3 hotdogs without the bun ~1 hour ago with soda)  Lab Results  Component Value Date   HGBA1C 6.9 (A) 03/23/2021   Vitals:   06/20/21 1359 06/20/21 1410  BP: (!) 175/93 (!) 159/85  Pulse: 99 91     Lipid Panel     Component Value Date/Time   CHOL 143 03/23/2021 1014   TRIG 39 03/23/2021 1014   HDL 47 03/23/2021 1014   CHOLHDL 3.0 03/23/2021 1014   CHOLHDL 4.8 04/30/2014 0015   VLDL 18 04/30/2014 0015   LDLCALC 87 03/23/2021 1014    Home fasting blood sugars: does not check at home   Home BP readings: does not check at home  Clinical Atherosclerotic Cardiovascular Disease (ASCVD): Yes  The ASCVD Risk score (Arnett DK, et al., 2019) failed to calculate for the following reasons:   The patient has a prior MI or stroke diagnosis   A/P: Diabetes longstanding currently controlled based on A1c. Patient is able to verbalize appropriate hypoglycemia management plan. He reports compliance to DM medications. BG is elevated today due to dietary indiscretions. Would ideally start a GLP1 agonist given stroke history and risk for hypoglycemia on current insulin regimen since he is not checking his BG at home.  -Started Trulicity 2.02 mg weekly (cost for this should be ~$9). Counseled him to store pens in the fridge then bring one to visit with Sharyn Lull on Monday to learn how to use it -Continued current insulin regimen: Lantus 36 units daily and Novolog 6 units TIDAC -Extensively discussed pathophysiology of diabetes, recommended lifestyle interventions, dietary effects on blood sugar control  -Counseled on s/sx  of and management of hypoglycemia -Next A1C anticipated 12/22.   ASCVD risk - secondary prevention in patient with diabetes. Last LDL is not controlled (87 on 03/23/21). High intensity statin indicated.  -Continue atorvastatin 40 mg daily.  -Will check lipid panel at next visit given change in statin at last visit  Hypertension longstanding currently above  goal.He is upset today and believes this is elevating his blood pressure. Blood pressure goal = 130/80 mmHg. Medication adherence to evening BP medications could be improved per his aunt.  -Start valsartan 80 mg daily -Continue other antihypertensives -Will check a BMET at next pharmacist clinic visit  Written patient instructions provided. Total time in face to face counseling 30 minutes. Follow up PCP visit 12/5. Follow up Pharmacist Clinic Visit in 4-6 weeks.    Rebbeca Paul, PharmD PGY2 Ambulatory Care Pharmacy Resident 06/20/2021 2:36 PM

## 2021-06-20 ENCOUNTER — Other Ambulatory Visit: Payer: Self-pay

## 2021-06-20 ENCOUNTER — Ambulatory Visit: Payer: Medicare Other | Attending: Primary Care | Admitting: Pharmacist

## 2021-06-20 VITALS — BP 159/85 | HR 91

## 2021-06-20 DIAGNOSIS — I10 Essential (primary) hypertension: Secondary | ICD-10-CM | POA: Diagnosis not present

## 2021-06-20 DIAGNOSIS — E1169 Type 2 diabetes mellitus with other specified complication: Secondary | ICD-10-CM

## 2021-06-20 DIAGNOSIS — Z794 Long term (current) use of insulin: Secondary | ICD-10-CM

## 2021-06-20 LAB — GLUCOSE, POCT (MANUAL RESULT ENTRY): POC Glucose: 269 mg/dl — AB (ref 70–99)

## 2021-06-20 MED ORDER — TRULICITY 0.75 MG/0.5ML ~~LOC~~ SOAJ
0.7500 mg | SUBCUTANEOUS | 0 refills | Status: DC
  Filled 2021-06-20: qty 2, 28d supply, fill #0

## 2021-06-20 MED ORDER — VALSARTAN 80 MG PO TABS: 80.0000 mg | ORAL_TABLET | Freq: Every day | ORAL | 0 refills | Status: DC

## 2021-06-20 MED ORDER — TRULICITY 0.75 MG/0.5ML ~~LOC~~ SOAJ: 0.7500 mg | SUBCUTANEOUS | 0 refills | Status: DC

## 2021-06-21 ENCOUNTER — Encounter: Payer: Self-pay | Admitting: Pharmacist

## 2021-06-25 ENCOUNTER — Other Ambulatory Visit: Payer: Self-pay

## 2021-06-25 ENCOUNTER — Ambulatory Visit (INDEPENDENT_AMBULATORY_CARE_PROVIDER_SITE_OTHER): Payer: Medicare Other | Admitting: Primary Care

## 2021-06-25 ENCOUNTER — Encounter (INDEPENDENT_AMBULATORY_CARE_PROVIDER_SITE_OTHER): Payer: Self-pay | Admitting: Primary Care

## 2021-06-25 VITALS — BP 141/80 | HR 97 | Temp 97.3°F | Ht 65.0 in | Wt 180.0 lb

## 2021-06-25 DIAGNOSIS — I10 Essential (primary) hypertension: Secondary | ICD-10-CM

## 2021-06-25 DIAGNOSIS — Z1211 Encounter for screening for malignant neoplasm of colon: Secondary | ICD-10-CM

## 2021-06-25 DIAGNOSIS — E119 Type 2 diabetes mellitus without complications: Secondary | ICD-10-CM

## 2021-06-25 DIAGNOSIS — Z760 Encounter for issue of repeat prescription: Secondary | ICD-10-CM

## 2021-06-25 DIAGNOSIS — G8110 Spastic hemiplegia affecting unspecified side: Secondary | ICD-10-CM

## 2021-06-25 DIAGNOSIS — B351 Tinea unguium: Secondary | ICD-10-CM

## 2021-06-25 LAB — POCT GLYCOSYLATED HEMOGLOBIN (HGB A1C): Hemoglobin A1C: 6.9 % — AB (ref 4.0–5.6)

## 2021-06-25 MED ORDER — TRIAMTERENE-HCTZ 37.5-25 MG PO TABS: 1.0000 | ORAL_TABLET | Freq: Every day | ORAL | 1 refills | Status: DC

## 2021-06-25 MED ORDER — VALSARTAN 80 MG PO TABS: 80.0000 mg | ORAL_TABLET | Freq: Every day | ORAL | 1 refills | Status: DC

## 2021-06-25 MED ORDER — TRULICITY 0.75 MG/0.5ML ~~LOC~~ SOAJ: 0.7500 mg | SUBCUTANEOUS | 0 refills | Status: DC

## 2021-06-25 MED ORDER — DEXCOM G4 PLATINUM TRANSMITTER MISC: 1.0000 | Freq: Three times a day (TID) | 0 refills | Status: DC

## 2021-06-25 NOTE — Progress Notes (Signed)
[facility]   Subjective:  [name], male    DOB: 04-15-1961  Age: 60 y.o. MRN: [id number]  CC: Diabetes and Hypertension   HPI [name] presents for follow-up of diabetes. Patient does not check blood sugar at home.  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  It would be beneficial for patient being on insulin to have a monitor that does not require sticking/pricking finger.  Compliant with meds - Yes Checking CBGs? No  Fasting avg -   Postprandial average -  Exercising regularly? - No Watching carbohydrate intake? - Yes Neuropathy ? - No Hypoglycemic events - No  - Recovers with :   Pertinent ROS:  Polyuria - No Polydipsia - No Vision problems - No Management of HTN denies shortness of breath, headaches, chest pain or lower extremity edema  Medications as noted below. Taking them regularly without complication/adverse reaction being reported today.   History Adam Callahan has a past medical history of Diabetes mellitus, Hypertension, and Stroke ([place]).   He has a past surgical history that includes TEE without cardioversion (N/A, 05/03/2014).   His family history includes Cervical cancer in his mother; Colon cancer in his maternal aunt; Diabetes Mellitus II in his maternal uncle; Hypertension in his sister.He reports that he has never smoked. He has never used smokeless tobacco. He reports that he does not drink alcohol and does not use drugs.  Current Outpatient Medications on File Prior to Visit  Medication Sig Dispense Refill   amLODipine (NORVASC) 10 MG tablet Take 1 tablet (10 mg total) by mouth daily. 90 tablet 1   ASPIRIN LOW DOSE 81 MG EC tablet Take 81 mg by mouth daily.     atorvastatin (LIPITOR) 40 MG tablet Take 1 tablet (40 mg total) by mouth daily. 90 tablet 3   cloNIDine (CATAPRES) 0.2 MG tablet Take 1 tablet (0.2 mg total) by mouth 2 (two) times daily. 180  tablet 1   clopidogrel (PLAVIX) 75 MG tablet Take 75 mg by mouth daily.     insulin aspart (NOVOLOG FLEXPEN) 100 UNIT/ML FlexPen Inject 6 Units into the skin 3 (three) times daily with meals. 15 mL 2   insulin glargine (LANTUS SOLOSTAR) 100 UNIT/ML Solostar Pen Inject 36 Units into the skin daily. 15 mL PRN   oxyCODONE (ROXICODONE) 5 MG immediate release tablet Take 1 tablet (5 mg total) by mouth every 4 (four) hours as needed for severe pain. 15 tablet 0   Current Facility-Administered Medications on File Prior to Visit  Medication Dose Route Frequency Provider Last Rate Last Admin   0.9 %  sodium chloride infusion  500 mL Intravenous Once Danis, Estill Cotta III, MD        ROS Comprehensive ROS noted pertinent positive and negative in HPI   Objective:  BP (!) 141/80 (BP Location: Left Arm, Patient Position: Sitting, Cuff Size: Large)   Pulse 97   Temp (!) 97.3 F (36.3 C) (Temporal)   Ht 5\' 5"  (1.651 m)   Wt 180 lb (81.6 kg)   SpO2 96%   BMI 29.95 kg/m   BP Readings from Last 3 Encounters:  06/25/21 (!) 141/80  06/20/21 (!) 159/85  05/21/21 (!) 178/98    Wt Readings from Last 3 Encounters:  06/25/21 180 lb (81.6 kg)  04/24/21 179 lb 12.8 oz (81.6 kg)  04/22/21 179 lb 3.2 oz (81.3 kg)   Physical  exam: General: Vital signs reviewed.  Patient is well-developed and well-nourished, obese male in no acute distress and cooperative with exam. Head: Normocephalic and atraumatic. Eyes: EOMI, conjunctivae normal, no scleral icterus. Neck: Supple, trachea midline, normal ROM, no JVD, masses, thyromegaly, or carotid bruit present. Cardiovascular: RRR, S1 normal, S2 normal, no murmurs, gallops, or rubs. Pulmonary/Chest: Clear to auscultation bilaterally, no wheezes, rales, or rhonchi. Abdominal: Soft, non-tender, non-distended, BS +, no masses, organomegaly, or guarding present. Musculoskeletal: No joint deformities, erythema, or stiffness, right side paralysis/hemiplegic. Right hand  constricted  Extremities: No lower extremity edema bilaterally,  pulses symmetric and intact bilaterally. No cyanosis or clubbing. Neurological: A&O x3, Strength is normal Skin: Warm, dry and intact. No rashes or erythema. Psychiatric: Normal mood and affect. speech and behavior is normal. Cognition and memory are normal.    Lab Results  Component Value Date   HGBA1C 6.9 (A) 06/25/2021   HGBA1C 6.9 (A) 03/23/2021   HGBA1C 11.1 (H) 04/30/2014    Lab Results  Component Value Date   WBC 6.5 04/22/2021   HGB 14.6 04/22/2021   HCT 43.0 04/22/2021   PLT 331 04/22/2021   GLUCOSE 85 04/22/2021   CHOL 143 03/23/2021   TRIG 39 03/23/2021   HDL 47 03/23/2021   LDLCALC 87 03/23/2021   ALT 18 03/23/2021   AST 19 03/23/2021   NA 138 04/22/2021   K 3.5 04/22/2021   CL 99 04/22/2021   CREATININE 1.30 (H) 04/22/2021   BUN 20 04/22/2021   CO2 27 04/22/2021   TSH 0.478 06/06/2013   INR 1.04 04/27/2014   HGBA1C 6.9 (A) 06/25/2021     Assessment & Plan:  Adam Callahan was seen today for diabetes and hypertension.  Diagnoses and all orders for this visit:  Type 2 diabetes mellitus without complication, without long-term current use of insulin (HCC) -     HgB A1c 6.9 maintained for the last 3 months .Continue to monitor foods that are high in carbohydrates are the following rice, potatoes, breads, sugars, and pastas.  Reduction in the intake (eating) will assist in lowering your blood sugars.  -     Ambulatory referral to Ophthalmology  Spastic hemiplegia affecting dominant side ([place]) Right hand constricted and hemiplegic   Essential hypertension Counseled on blood pressure goal of less than 130/80, low-sodium, DASH diet, medication compliance, 150 minutes of moderate intensity exercise per week. Discussed medication compliance, adverse effects.  -     triamterene-hydrochlorothiazide (MAXZIDE-25) 37.5-25 MG tablet; Take 1 tablet by mouth daily.  Colon cancer screening -      Cologuard  Onychomycosis -     Ambulatory referral to Podiatry  Other orders/Medication refill -     valsartan (DIOVAN) 80 MG tablet; Take 1 tablet (80 mg total) by mouth daily. -     Dulaglutide (TRULICITY) 1.61 WR/6.0AV SOPN; Inject 0.75 mg into the skin once a week. -     Continuous Blood Gluc Transmit (DEXCOM G4 PLATINUM TRANSMITTER) MISC; 1 Device by Does not apply route in the morning, at noon, and at bedtime.     triamterene-hydrochlorothiazide (MAXZIDE-25) 37.5-25 MG tablet; Take 1 tablet by mouth daily.  I am having Elberta Fortis A. Chrissie Noa start on Dexcom G4 Diplomatic Services operational officer. I am also having him maintain his Aspirin Low Dose, clopidogrel, amLODipine, oxyCODONE, Lantus SoloStar, cloNIDine, atorvastatin, NovoLOG FlexPen, valsartan, Trulicity, and triamterene-hydrochlorothiazide. We will continue to administer sodium chloride.  Meds ordered this encounter  Medications   valsartan (DIOVAN) 80 MG tablet    Sig:  Take 1 tablet (80 mg total) by mouth daily.    Dispense:  90 tablet    Refill:  1   Dulaglutide (TRULICITY) 1.61 WR/6.0AV SOPN    Sig: Inject 0.75 mg into the skin once a week.    Dispense:  2 mL    Refill:  0   triamterene-hydrochlorothiazide (MAXZIDE-25) 37.5-25 MG tablet    Sig: Take 1 tablet by mouth daily.    Dispense:  90 tablet    Refill:  1   Continuous Blood Gluc Transmit (DEXCOM G4 PLATINUM TRANSMITTER) MISC    Sig: 1 Device by Does not apply route in the morning, at noon, and at bedtime.    Dispense:  1 each    Refill:  0     Follow-up:   Return in about 3 months (around 09/23/2021) for Bp f/u.  The above assessment and management plan was discussed with the patient. The patient verbalized understanding of and has agreed to the management plan. Patient is aware to call the clinic if symptoms fail to improve or worsen. Patient is aware when to return to the clinic for a follow-up visit. Patient educated on when it is appropriate to go to the emergency  department.   Juluis Mire, NP-C

## 2021-07-24 NOTE — Progress Notes (Signed)
S:    PCP: Michelle   Patient arrives in good spirits. Presents for diabetes evaluation, education, and management. Patient was referred and last seen by Primary Care Provider on 03/23/2021. A1c at that visit was 6.9%. Lurena Joiner saw him on 04/20/2021, 05/21/21, and 06/20/21. Started valsartan and Trulicity at that time. Seen by PCP 12/5, BP was slightly improved and A1c was stable.   Today, patient arrives in good spirits accompanied by a family member. He did not start taking Trulicity because he doesn't want to take a medication that "messes with his brain." He did start valsartan. Denies dizziness, headaches, swelling, blurred vision. Denies symptoms of hypoglycemia. He would like to use a CGM to check his BG since he has trouble checking his BG via fingerstick on his own because of his stroke.   Family/Social History:  -HTN, DM -Tobacco: never smoker  -Alcohol: none reported   Insurance coverage/medication affordability: Medicare   Medication adherence reported.   Current diabetes medications include: Lantus 36 units daily at night, Novolog 6 units TID before meals, Trulicity 8.25 mg weekly (not taking) Current hypertension medications include: amlodipine 10 mg daily, clonidine 0.2 mg BID, Maxzide 25-37.5 mg daily, valsartan 80 mg daily Current hyperlipidemia medications include: atorvastatin 40 mg daily  Patient denies hypoglycemic events.  Patient reported dietary habits:  - Reports that he mostly adheres to a diabetic diet.  - He is somewhat of a poor historian   Patient-reported exercise habits:  - Uses a treadmill at home along with arm and core exercises as tolerable  - Unable to specify amount of time spent exercising.   Patient denies nocturia (nighttime urination).  Patient denies neuropathy (nerve pain). Patient denies visual changes. Patient reports self foot exams.    O:  POCT BG: 176 (ate [place] bacon and another food he can't remember about 1 hour ago)  Lab Results   Component Value Date   HGBA1C 6.9 (A) 06/25/2021   Vitals:   07/25/21 1357  BP: 114/75  Pulse: 86    Lipid Panel     Component Value Date/Time   CHOL 143 03/23/2021 1014   TRIG 39 03/23/2021 1014   HDL 47 03/23/2021 1014   CHOLHDL 3.0 03/23/2021 1014   CHOLHDL 4.8 04/30/2014 0015   VLDL 18 04/30/2014 0015   LDLCALC 87 03/23/2021 1014    Home fasting blood sugars: does not check at home   Home BP readings: does not check at home  Clinical Atherosclerotic Cardiovascular Disease (ASCVD): Yes  The ASCVD Risk score (Arnett DK, et al., 2019) failed to calculate for the following reasons:   The patient has a prior MI or stroke diagnosis   A/P: Diabetes longstanding currently controlled based on A1c. Patient is able to verbalize appropriate hypoglycemia management plan. Discussed risk reduction for future strokes with Trulicity but patient does not want to start taking it. Since A1c remains at goal, will continue current regimen.  -Discontinue Trulicity from medication list -Continued current medications: Lantus 36 units daily and Novolog 6 units TIDAC -Sent in Dexcom G6 to his pharmacy to determine coverage -Extensively discussed pathophysiology of diabetes, recommended lifestyle interventions, dietary effects on blood sugar control  -Counseled on s/sx of and management of hypoglycemia -Next A1C anticipated March 2023.   ASCVD risk - secondary prevention in patient with diabetes. Last LDL is not controlled (87 on 03/23/21). High intensity statin indicated.  -Continue atorvastatin 40 mg daily.  -Will check lipid panel at next visit given change in statin at  visit in October to see if LDL is at goal <70 mg/dL.   Hypertension longstanding currently at goal. Blood pressure goal = 130/80 mmHg. Endorses compliance with antihypertensive medications including valsartan which was started at last visit.  -Continue current antihypertensives -Will check a BMET today  Written patient  instructions provided. Total time in face to face counseling 30 minutes. Follow up with PCP in March.    Rebbeca Paul, PharmD PGY2 Ambulatory Care Pharmacy Resident 07/25/2021 1:57 PM

## 2021-07-25 ENCOUNTER — Ambulatory Visit: Payer: Medicare Other | Attending: Primary Care | Admitting: Pharmacist

## 2021-07-25 ENCOUNTER — Other Ambulatory Visit: Payer: Self-pay

## 2021-07-25 VITALS — BP 114/75 | HR 86

## 2021-07-25 DIAGNOSIS — Z794 Long term (current) use of insulin: Secondary | ICD-10-CM

## 2021-07-25 DIAGNOSIS — E1169 Type 2 diabetes mellitus with other specified complication: Secondary | ICD-10-CM

## 2021-07-25 LAB — GLUCOSE, POCT (MANUAL RESULT ENTRY): POC Glucose: 176 mg/dL — AB (ref 70–99)

## 2021-07-25 MED ORDER — DEXCOM G6 SENSOR MISC: 2 refills | Status: DC

## 2021-07-25 MED ORDER — DEXCOM G6 TRANSMITTER MISC: 3 refills | Status: DC

## 2021-07-25 MED ORDER — DEXCOM G6 RECEIVER DEVI: 0 refills | Status: DC

## 2021-07-26 LAB — CMP14+EGFR
ALT: 17 IU/L (ref 0–44)
AST: 18 IU/L (ref 0–40)
Albumin/Globulin Ratio: 1.2 (ref 1.2–2.2)
Albumin: 4.2 g/dL (ref 3.8–4.9)
Alkaline Phosphatase: 75 IU/L (ref 44–121)
BUN/Creatinine Ratio: 16 (ref 9–20)
BUN: 29 mg/dL — ABNORMAL HIGH (ref 6–24)
Bilirubin Total: 0.4 mg/dL (ref 0.0–1.2)
CO2: 26 mmol/L (ref 20–29)
Calcium: 9.9 mg/dL (ref 8.7–10.2)
Chloride: 98 mmol/L (ref 96–106)
Creatinine, Ser: 1.77 mg/dL — ABNORMAL HIGH (ref 0.76–1.27)
Globulin, Total: 3.5 g/dL (ref 1.5–4.5)
Glucose: 171 mg/dL — ABNORMAL HIGH (ref 70–99)
Potassium: 4.2 mmol/L (ref 3.5–5.2)
Sodium: 138 mmol/L (ref 134–144)
Total Protein: 7.7 g/dL (ref 6.0–8.5)
eGFR: 44 mL/min/{1.73_m2} — ABNORMAL LOW (ref >59)

## 2021-07-26 LAB — LIPID PANEL
Chol/HDL Ratio: 2.7 ratio (ref 0.0–5.0)
Cholesterol, Total: 115 mg/dL (ref 100–199)
HDL: 42 mg/dL (ref >39)
LDL Chol Calc (NIH): 61 mg/dL (ref 0–99)
Triglycerides: 54 mg/dL (ref 0–149)
VLDL Cholesterol Cal: 12 mg/dL (ref 5–40)

## 2021-07-31 ENCOUNTER — Telehealth (INDEPENDENT_AMBULATORY_CARE_PROVIDER_SITE_OTHER): Payer: Self-pay

## 2021-07-31 NOTE — Telephone Encounter (Signed)
-----   Message from Kerin Perna, NP sent at 07/30/2021  4:57 PM EST ----- Labs are normal except creatinine is elevation a sign of kidney problems- increase water and decrease soda intake . Follow 3 months will re ck kidney functions

## 2021-07-31 NOTE — Telephone Encounter (Signed)
Patient is aware of results and verbalized understanding. Nat Christen, CMA

## 2021-08-06 ENCOUNTER — Other Ambulatory Visit: Payer: Self-pay

## 2021-08-06 ENCOUNTER — Ambulatory Visit (INDEPENDENT_AMBULATORY_CARE_PROVIDER_SITE_OTHER): Payer: Medicare Other | Admitting: Podiatry

## 2021-08-06 ENCOUNTER — Encounter: Payer: Self-pay | Admitting: Podiatry

## 2021-08-06 DIAGNOSIS — M79674 Pain in right toe(s): Secondary | ICD-10-CM

## 2021-08-06 DIAGNOSIS — E1142 Type 2 diabetes mellitus with diabetic polyneuropathy: Secondary | ICD-10-CM | POA: Diagnosis not present

## 2021-08-06 DIAGNOSIS — B351 Tinea unguium: Secondary | ICD-10-CM | POA: Diagnosis not present

## 2021-08-06 DIAGNOSIS — I6310 Cerebral infarction due to embolism of unspecified precerebral artery: Secondary | ICD-10-CM | POA: Diagnosis not present

## 2021-08-06 DIAGNOSIS — E1140 Type 2 diabetes mellitus with diabetic neuropathy, unspecified: Secondary | ICD-10-CM | POA: Insufficient documentation

## 2021-08-06 DIAGNOSIS — M79675 Pain in left toe(s): Secondary | ICD-10-CM | POA: Diagnosis not present

## 2021-08-06 NOTE — Progress Notes (Signed)
This patient returns to my office for at risk foot care.  This patient requires this care by a professional since this patient will be at risk due to having diabetes and coagulation defect due to plavix. He presents to the office with male caregiver.  This patient is unable to cut nails himself since the patient cannot reach his nails.These nails are painful walking and wearing shoes.  This patient presents for at risk foot care today.  General Appearance  Alert, conversant and in no acute stress.  Vascular  Dorsalis pedis and posterior tibial  pulses are palpable  left foot.  Dorsalis pedis WNL right.  Absent posterior tibial pulses  B/L.Marland Kitchen  Capillary return is within normal limits  bilaterally. Temperature is within normal limits  bilaterally.  Neurologic  Senn-Weinstein monofilament wire test within normal limits  left foot.  LOPS absent right foot.. Muscle power within normal limits bilaterally.  Nails Thick disfigured discolored nails with subungual debris  from hallux to fifth toes bilaterally. No evidence of bacterial infection or drainage bilaterally.  Orthopedic  No limitations of motion  feet .  No crepitus or effusions noted.  No bony pathology or digital deformities noted.  Skin  normotropic skin with no porokeratosis noted bilaterally.  No signs of infections or ulcers noted.   Dry peeling skin  B/L.  Onychomycosis  Pain in right toes  Pain in left toes  Diabetes  IE.  Consent was obtained for treatment procedures.   Mechanical debridement of nails 1-5  bilaterally performed with a nail nipper.  Filed with dremel without incident.  Foot exam findings consistent with CVA.   Return office visit   3 months                   Told patient to return for periodic foot care and evaluation due to potential at risk complications.   Gardiner Barefoot DPM

## 2021-09-24 ENCOUNTER — Ambulatory Visit (INDEPENDENT_AMBULATORY_CARE_PROVIDER_SITE_OTHER): Payer: Medicare Other | Admitting: Primary Care

## 2021-09-24 ENCOUNTER — Other Ambulatory Visit: Payer: Self-pay

## 2021-09-24 ENCOUNTER — Encounter (INDEPENDENT_AMBULATORY_CARE_PROVIDER_SITE_OTHER): Payer: Self-pay | Admitting: Primary Care

## 2021-09-24 VITALS — BP 153/85 | HR 76 | Temp 98.0°F | Ht 65.0 in | Wt 185.0 lb

## 2021-09-24 DIAGNOSIS — I10 Essential (primary) hypertension: Secondary | ICD-10-CM | POA: Diagnosis not present

## 2021-09-27 MED ORDER — VALSARTAN 80 MG PO TABS: 80.0000 mg | ORAL_TABLET | Freq: Every day | ORAL | 1 refills | Status: DC

## 2021-09-27 NOTE — Progress Notes (Signed)
?[place] ? ? ?Adam Callahan is a 61 y.o. male presents for hypertension evaluation, Denies shortness of breath, headaches, chest pain or lower extremity edema, sudden onset, vision changes, unilateral weakness, dizziness, paresthesias  ? ?Patient reports adherence with medications. ? ?Dietary habits include: discussed better eating habits  ?Exercise habits include: walking  ?Family / Social history:  ?Cervical cancer Mother  ?Hypertension Sister  ?Diabetes Mellitus II Maternal Uncle  ?Colon cancer Maternal Aunt  ? ? ? ?Past Medical History:  ?Diagnosis Date  ? Diabetes mellitus   ? Hypertension   ? Stroke [facility])   ? ?Past Surgical History:  ?Procedure Laterality Date  ? TEE WITHOUT CARDIOVERSION N/A 05/03/2014  ? Procedure: TRANSESOPHAGEAL ECHOCARDIOGRAM (TEE);  Surgeon: Pixie Casino, MD;  Location: [place];  Service: Cardiovascular;  Laterality: N/A;  ? ?No Known Allergies ?Current Outpatient Medications on File Prior to Visit  ?Medication Sig Dispense Refill  ? amLODipine (NORVASC) 10 MG tablet Take 1 tablet (10 mg total) by mouth daily. 90 tablet 1  ? ASPIRIN LOW DOSE 81 MG EC tablet Take 81 mg by mouth daily.    ? atorvastatin (LIPITOR) 40 MG tablet Take 1 tablet (40 mg total) by mouth daily. 90 tablet 3  ? cloNIDine (CATAPRES) 0.2 MG tablet Take 1 tablet (0.2 mg total) by mouth 2 (two) times daily. 180 tablet 1  ? clopidogrel (PLAVIX) 75 MG tablet Take 75 mg by mouth daily.    ? insulin aspart (NOVOLOG FLEXPEN) 100 UNIT/ML FlexPen Inject 6 Units into the skin 3 (three) times daily with meals. 15 mL 2  ? insulin glargine (LANTUS SOLOSTAR) 100 UNIT/ML Solostar Pen Inject 36 Units into the skin daily. 15 mL PRN  ? meloxicam (MOBIC) 15 MG tablet Take 15 mg by mouth daily as needed.    ? triamterene-hydrochlorothiazide (MAXZIDE-25) 37.5-25 MG tablet Take 1 tablet by mouth daily. 90 tablet 1  ? ?Current Facility-Administered Medications on File Prior to Visit  ?Medication Dose Route  Frequency Provider Last Rate Last Admin  ? 0.9 %  sodium chloride infusion  500 mL Intravenous Once Doran Stabler, MD      ? ?Social History  ? ?Socioeconomic History  ? Marital status: Divorced  ?  Spouse name: Not on file  ? Number of children: 2  ? Years of education: 40  ? Highest education level: Not on file  ?Occupational History  ? Not on file  ?Tobacco Use  ? Smoking status: Never  ? Smokeless tobacco: Never  ?Vaping Use  ? Vaping Use: Never used  ?Substance and Sexual Activity  ? Alcohol use: No  ?  Alcohol/week: 0.0 standard drinks  ? Drug use: No  ? Sexual activity: Not on file  ?Other Topics Concern  ? Not on file  ?Social History Narrative  ? Patient is single and has 2 children.  ? Patient is right handed.  ? Patient has hs education.  ? Patient drinks diet caffeine free sodas daily.  ? ?Social Determinants of Health  ? ?Financial Resource Strain: Not on file  ?Food Insecurity: Not on file  ?Transportation Needs: Not on file  ?Physical Activity: Not on file  ?Stress: Not on file  ?Social Connections: Not on file  ?Intimate Partner Violence: Not on file  ? ?OBJECTIVE: ? ?Vitals:  ? 09/24/21 1136 09/24/21 1146  ?BP: (!) 159/82 (!) 153/85  ?Pulse: 76 76  ?Temp: 98 ?F (36.7 ?C)   ?TempSrc: Oral   ?SpO2: 98%   ?  Weight: 185 lb (83.9 kg)   ?Height: '5\' 5"'$  (1.651 m)   ? ? ?Physical Exam ?Vitals reviewed.  ?Constitutional:   ?   Appearance: He is obese.  ?HENT:  ?   Head: Normocephalic.  ?   Right Ear: Tympanic membrane and external ear normal.  ?   Left Ear: Tympanic membrane and external ear normal.  ?   Nose: Nose normal.  ?Eyes:  ?   Extraocular Movements: Extraocular movements intact.  ?   Pupils: Pupils are equal, round, and reactive to light.  ?Cardiovascular:  ?   Rate and Rhythm: Normal rate and regular rhythm.  ?Pulmonary:  ?   Effort: Pulmonary effort is normal.  ?   Breath sounds: Normal breath sounds.  ?Abdominal:  ?   General: Abdomen is flat. There is distension.  ?   Palpations: Abdomen is  soft.  ?Musculoskeletal:     ?   General: Normal range of motion.  ?   Cervical back: Normal range of motion.  ?Skin: ?   General: Skin is warm and dry.  ?Neurological:  ?   Mental Status: He is alert and oriented to person, place, and time.  ?Psychiatric:     ?   Mood and Affect: Mood normal.     ?   Behavior: Behavior normal.     ?   Thought Content: Thought content normal.     ?   Judgment: Judgment normal.  ? ? ?ROS ?Comprehensive ROS Pertinent positive and negative noted in HPI   ?Last 3 Office BP readings: ?BP Readings from Last 3 Encounters:  ?09/24/21 (!) 153/85  ?07/25/21 114/75  ?06/25/21 (!) 141/80  ? ? ?BMET ?   ?Component Value Date/Time  ? NA 138 07/25/2021 1409  ? K 4.2 07/25/2021 1409  ? CL 98 07/25/2021 1409  ? CO2 26 07/25/2021 1409  ? GLUCOSE 171 (H) 07/25/2021 1409  ? GLUCOSE 85 04/22/2021 1733  ? BUN 29 (H) 07/25/2021 1409  ? CREATININE 1.77 (H) 07/25/2021 1409  ? CALCIUM 9.9 07/25/2021 1409  ? GFRNONAA >60 04/22/2021 1720  ? GFRAA >60 11/17/2015 1651  ? ? ?Renal function: ?CrCl cannot be calculated (Patient's most recent lab result is older than the maximum 21 days allowed.). ? ?Clinical ASCVD: No  ?The ASCVD Risk score (Arnett DK, et al., 2019) failed to calculate for the following reasons: ?  The patient has a prior MI or stroke diagnosis ? ?ASCVD risk factors include- [place] ? ? ?ASSESSMENT & PLAN: ?Meds ordered this encounter  ?Medications  ? valsartan (DIOVAN) 80 MG tablet  ?  Sig: Take 1 tablet (80 mg total) by mouth daily.  ?  Dispense:  90 tablet  ?  Refill:  1  ? ?Adam Callahan was seen today for blood pressure check. ? ?Diagnoses and all orders for this visit: ? ?Essential hypertension ?-Counseled on lifestyle modifications for blood pressure control including reduced dietary sodium, increased exercise, weight reduction and adequate sleep. Also, educated patient about the risk for cardiovascular events, stroke and heart attack. Also counseled patient about the importance of medication  adherence. If you participate in smoking, it is important to stop using tobacco as this will increase the risks associated with uncontrolled blood pressure.  ? ?-Hypertension longstanding diagnosed currently amlodipine '10mg'$ , and (MAXZIDE-25) 37.5-25 MG tablet daily on current medications. Added Diovan '80mg'$  daily Patient  adherent with current medications.  ? ?Goal BP:  ?For patients younger than 60: Goal BP < 130/80. ?For patients 60 and  older: Goal BP < 140/90. ?For patients with diabetes: Goal BP < 130/80. ?Your most recent BP: 153/85 ? ?Minimize salt intake. ?Minimize alcohol intake ? ? ? ?This note has been created with Surveyor, quantity. Any transcriptional errors are unintentional.  ? ?Kerin Perna, NP ?09/27/2021, 9:45 PM ?  ?

## 2021-10-11 ENCOUNTER — Telehealth (INDEPENDENT_AMBULATORY_CARE_PROVIDER_SITE_OTHER): Payer: Self-pay

## 2021-10-11 NOTE — Telephone Encounter (Signed)
Copied from [place] [id number]. Topic: General - Other ?>> Oct 10, 2021  2:07 PM Alanda Slim E wrote: ?Reason for CRM: pt needs transportation for his appt on 4.6.23/ pt stated also to tell Sharyn Lull that his aunt is not caring for him anymore / pt also needs help in home with taking his medication /please advise ? ? ?Patient aware that [facility] is no longer offering transportation services and his insurance does not cover transportation or nurse to come to home. He states he has applied for another insurance that may be able to offer those services. He was informed that PCP was made aware that aunt is no longer caring for him. Nat Christen, CMA  ?

## 2021-10-12 ENCOUNTER — Encounter (INDEPENDENT_AMBULATORY_CARE_PROVIDER_SITE_OTHER): Payer: Self-pay

## 2021-10-12 ENCOUNTER — Ambulatory Visit (INDEPENDENT_AMBULATORY_CARE_PROVIDER_SITE_OTHER): Payer: Medicare Other

## 2021-10-12 DIAGNOSIS — Z0000 Encounter for general adult medical examination without abnormal findings: Secondary | ICD-10-CM

## 2021-10-12 NOTE — Progress Notes (Signed)
? ?Subjective:  ? Adam Callahan is a 61 y.o. male who presents for an Initial Medicare Annual Wellness Visit. ? ?I connected with  Adam Callahan on 10/12/21 by a audio enabled telemedicine application and verified that I am speaking with the correct person using two identifiers. ? ?Patient Location: Home ? ?Provider Location: Office/Clinic ? ?I discussed the limitations of evaluation and management by telemedicine. The patient expressed understanding and agreed to proceed. ?  ? ?   ?Objective:  ?  ?Today's Vitals  ? ?There is no height or weight on file to calculate BMI. ? ? ?  04/22/2021  ?  3:56 PM 03/02/2016  ?  3:46 AM 11/17/2015  ?  4:40 PM 11/17/2015  ?  4:46 AM 03/09/2015  ? 10:10 AM 03/08/2015  ?  2:53 PM 11/14/2014  ? 11:08 AM  ?Advanced Directives  ?Does Patient Have a Medical Advance Directive? No;Yes Yes No No No No No  ?Type of Paramedic of [place];Living will       ?Does patient want to make changes to medical advance directive?  No - Patient declined       ?Copy of [place] in Chart?  No - copy requested       ?Would patient like information on creating a medical advance directive?   No - patient declined information No - patient declined information No - patient declined information No - patient declined information   ? ? ?Current Medications (verified) ?Outpatient Encounter Medications as of 10/12/2021  ?Medication Sig  ? amLODipine (NORVASC) 10 MG tablet Take 1 tablet (10 mg total) by mouth daily.  ? ASPIRIN LOW DOSE 81 MG EC tablet Take 81 mg by mouth daily.  ? atorvastatin (LIPITOR) 40 MG tablet Take 1 tablet (40 mg total) by mouth daily.  ? cloNIDine (CATAPRES) 0.2 MG tablet Take 1 tablet (0.2 mg total) by mouth 2 (two) times daily.  ? clopidogrel (PLAVIX) 75 MG tablet Take 75 mg by mouth daily.  ? insulin aspart (NOVOLOG FLEXPEN) 100 UNIT/ML FlexPen Inject 6 Units into the skin 3 (three) times daily with meals.  ?  insulin glargine (LANTUS SOLOSTAR) 100 UNIT/ML Solostar Pen Inject 36 Units into the skin daily.  ? meloxicam (MOBIC) 15 MG tablet Take 15 mg by mouth daily as needed.  ? triamterene-hydrochlorothiazide (MAXZIDE-25) 37.5-25 MG tablet Take 1 tablet by mouth daily.  ? valsartan (DIOVAN) 80 MG tablet Take 1 tablet (80 mg total) by mouth daily.  ? ?Facility-Administered Encounter Medications as of 10/12/2021  ?Medication  ? 0.9 %  sodium chloride infusion  ? ? ?Allergies (verified) ?Patient has no known allergies.  ? ?History: ?Past Medical History:  ?Diagnosis Date  ? Diabetes mellitus   ? Hypertension   ? Stroke [facility])   ? ?Past Surgical History:  ?Procedure Laterality Date  ? TEE WITHOUT CARDIOVERSION N/A 05/03/2014  ? Procedure: TRANSESOPHAGEAL ECHOCARDIOGRAM (TEE);  Surgeon: Pixie Casino, MD;  Location: [place];  Service: Cardiovascular;  Laterality: N/A;  ? ?Family History  ?Problem Relation Age of Onset  ? Cervical cancer Mother   ? Hypertension Sister   ? Diabetes Mellitus II Maternal Uncle   ? Colon cancer Maternal Aunt   ? ?Social History  ? ?Socioeconomic History  ? Marital status: Divorced  ?  Spouse name: Not on file  ? Number of children: 2  ? Years of education: 42  ? Highest education level: Not on file  ?  Occupational History  ? Not on file  ?Tobacco Use  ? Smoking status: Never  ? Smokeless tobacco: Never  ?Vaping Use  ? Vaping Use: Never used  ?Substance and Sexual Activity  ? Alcohol use: No  ?  Alcohol/week: 0.0 standard drinks  ? Drug use: No  ? Sexual activity: Not on file  ?Other Topics Concern  ? Not on file  ?Social History Narrative  ? Patient is single and has 2 children.  ? Patient is right handed.  ? Patient has hs education.  ? Patient drinks diet caffeine free sodas daily.  ? ?Social Determinants of Health  ? ?Financial Resource Strain: Not on file  ?Food Insecurity: Not on file  ?Transportation Needs: Not on file  ?Physical Activity: Not on file  ?Stress: Not on file  ?Social  Connections: Not on file  ? ? ?Tobacco Counseling ?Counseling given: Not Answered ? ? ?Clinical Intake: ? ?  ? ?  ? ?  ? ?  ? ?  ? ?Diabetic?yes ?Nutrition Risk Assessment: ? ?Has the patient had any N/V/D within the last 2 months?  No  ?Does the patient have any non-healing wounds?  No  ?Has the patient had any unintentional weight loss or weight gain?  No  ? ?Diabetes: ? ?Is the patient diabetic?  Yes  ?If diabetic, was a CBG obtained today?  No  ?Did the patient bring in their glucometer from home?   Pt at home ?How often do you monitor your CBG's? Never.  ? ?Financial Strains and Diabetes Management: ? ?Are you having any financial strains with the device, your supplies or your medication? No .  ?Does the patient want to be seen by Chronic Care Management for management of their diabetes?  Yes  ?Would the patient like to be referred to a Nutritionist or for Diabetic Management?  No  ? ?Diabetic Exams: ? ?Diabetic Eye Exam: Overdue for diabetic eye exam. Pt has been advised about the importance in completing this exam. Patient advised to call and schedule an eye exam. ? ? ? ?  ? ?  ? ? ?Activities of Daily Living ?   ? View : No data to display.  ?  ?  ?  ? ? ? ?Patient Care Team: ?Kerin Perna, NP as PCP - General (Internal Medicine) ? ?Indicate any recent Medical Services you may have received from other than [facility] providers in the past year (date may be approximate). ? ?   ?Assessment:  ? This is a routine wellness examination for [name]. ? ?Hearing/Vision screen ?No results found. ? ?Dietary issues and exercise activities discussed: ?  ? ? Goals Addressed   ? ?  ?  ?  ?  ? This Visit's Progress  ?  Increase physical activity     ? ?  ?Depression Screen ? ?  09/24/2021  ? 11:36 AM 06/25/2021  ?  1:44 PM 04/24/2021  ?  2:57 PM 04/10/2021  ?  2:31 PM 03/23/2021  ?  9:08 AM 11/14/2014  ? 11:17 AM  ?PHQ 2/9 Scores  ?PHQ - 2 Score 0 0 0 0 0 3  ?PHQ- 9 Score  2    11  ?  ?Fall Risk ? ?  09/24/2021  ? 11:36 AM 06/25/2021   ?  1:44 PM 04/24/2021  ?  2:57 PM 04/10/2021  ?  2:31 PM 03/23/2021  ?  9:08 AM  ?Fall Risk   ?Falls in the past year? 0 0 0 0  0  ?Number falls in past yr:  0     ?Risk for fall due to : Impaired balance/gait      ? ? ?FALL RISK PREVENTION PERTAINING TO THE HOME: ? ?Any stairs in or around the home? Yes  ?If so, are there any without handrails? Yes  ?Home free of loose throw rugs in walkways, pet beds, electrical cords, etc? No  ?Adequate lighting in your home to reduce risk of falls? Yes  ? ?ASSISTIVE DEVICES UTILIZED TO PREVENT FALLS: ? ?Life alert? No  ?Use of a cane, walker or w/c? Yes  ?Grab bars in the bathroom? Yes  ?Shower chair or bench in shower? Yes  ?Elevated toilet seat or a handicapped toilet? No  ? ?Cognitive Function: ?  ?  ?  ? ?Immunizations ?Immunization History  ?Administered Date(s) Administered  ? Influenza,inj,Quad PF,6+ Mos 06/08/2013, 04/30/2014  ? PFIZER(Purple Top)SARS-COV-2 Vaccination 11/11/2019, 12/06/2019  ? Pneumococcal Polysaccharide-23 06/08/2013  ? ? ?TDAP status: Due, Education has been provided regarding the importance of this vaccine. Advised may receive this vaccine at local pharmacy or Health Dept. Aware to provide a copy of the vaccination record if obtained from local pharmacy or Health Dept. Verbalized acceptance and understanding. ? ?Flu Vaccine status: Declined, Education has been provided regarding the importance of this vaccine but patient still declined. Advised may receive this vaccine at local pharmacy or Health Dept. Aware to provide a copy of the vaccination record if obtained from local pharmacy or Health Dept. Verbalized acceptance and understanding. ? ?Pneumococcal vaccine status: Declined,  Education has been provided regarding the importance of this vaccine but patient still declined. Advised may receive this vaccine at local pharmacy or Health Dept. Aware to provide a copy of the vaccination record if obtained from local pharmacy or Health Dept. Verbalized  acceptance and understanding.  ? ?Covid-19 vaccine status: Completed vaccines ? ?Qualifies for Shingles Vaccine? Yes   ?Zostavax completed No   ?Shingrix Completed?: No.    Education has been provided regarding the

## 2021-10-12 NOTE — Patient Instructions (Signed)

## 2021-10-25 ENCOUNTER — Ambulatory Visit (INDEPENDENT_AMBULATORY_CARE_PROVIDER_SITE_OTHER): Payer: Medicare Other | Admitting: Primary Care

## 2021-11-05 ENCOUNTER — Ambulatory Visit: Payer: Medicare Other | Admitting: Podiatry

## 2021-11-29 ENCOUNTER — Ambulatory Visit (INDEPENDENT_AMBULATORY_CARE_PROVIDER_SITE_OTHER): Payer: Medicare Other | Admitting: Primary Care

## 2021-11-29 ENCOUNTER — Encounter (INDEPENDENT_AMBULATORY_CARE_PROVIDER_SITE_OTHER): Payer: Self-pay | Admitting: Primary Care

## 2021-11-29 VITALS — BP 158/79 | HR 94 | Temp 97.8°F | Ht 65.0 in | Wt 184.4 lb

## 2021-11-29 DIAGNOSIS — Z1211 Encounter for screening for malignant neoplasm of colon: Secondary | ICD-10-CM

## 2021-11-29 DIAGNOSIS — E119 Type 2 diabetes mellitus without complications: Secondary | ICD-10-CM | POA: Diagnosis not present

## 2021-11-29 DIAGNOSIS — Z760 Encounter for issue of repeat prescription: Secondary | ICD-10-CM | POA: Diagnosis not present

## 2021-11-29 DIAGNOSIS — I10 Essential (primary) hypertension: Secondary | ICD-10-CM | POA: Diagnosis not present

## 2021-11-29 DIAGNOSIS — Z131 Encounter for screening for diabetes mellitus: Secondary | ICD-10-CM

## 2021-11-29 LAB — POCT GLYCOSYLATED HEMOGLOBIN (HGB A1C): Hemoglobin A1C: 6.4 % — AB (ref 4.0–5.6)

## 2021-11-29 MED ORDER — INSULIN LISPRO (1 UNIT DIAL) 100 UNIT/ML (KWIKPEN)
6.0000 [IU] | PEN_INJECTOR | Freq: Three times a day (TID) | SUBCUTANEOUS | 3 refills | Status: DC
Start: 2021-11-29 — End: 2021-12-25

## 2021-11-29 MED ORDER — TRIAMTERENE-HCTZ 37.5-25 MG PO TABS: 1.0000 | ORAL_TABLET | Freq: Every day | ORAL | 1 refills | Status: DC

## 2021-11-29 MED ORDER — LANTUS SOLOSTAR 100 UNIT/ML ~~LOC~~ SOPN: 36.0000 [IU] | PEN_INJECTOR | Freq: Every day | SUBCUTANEOUS | 99 refills | Status: DC

## 2021-11-29 MED ORDER — AMLODIPINE BESYLATE 10 MG PO TABS: 10.0000 mg | ORAL_TABLET | Freq: Every day | ORAL | 1 refills | Status: DC

## 2021-11-29 NOTE — Progress Notes (Signed)
[place], is a 61 y.o. male  UKG:[id number]  [id number]  DOB - 21-Jul-1961  Chief Complaint  Patient presents with   Follow-up    Diabetes        Subjective:   Adam Callahan is a 61 y.o. male here today for a follow up visit. Patient has No headache, No chest pain, No abdominal pain - No Nausea, No new weakness tingling or numbness, No Cough - shortness of breath  No problems updated.  No Known Allergies  Past Medical History:  Diagnosis Date   Diabetes mellitus    Hypertension    Stroke [facility])     Current Outpatient Medications on File Prior to Visit  Medication Sig Dispense Refill   ASPIRIN LOW DOSE 81 MG EC tablet Take 81 mg by mouth daily.     atorvastatin (LIPITOR) 40 MG tablet Take 1 tablet (40 mg total) by mouth daily. 90 tablet 3   clopidogrel (PLAVIX) 75 MG tablet Take 75 mg by mouth daily.     meloxicam (MOBIC) 15 MG tablet Take 15 mg by mouth daily as needed.     Current Facility-Administered Medications on File Prior to Visit  Medication Dose Route Frequency Provider Last Rate Last Admin   0.9 %  sodium chloride infusion  500 mL Intravenous Once Nelida Meuse III, MD        Objective:   Vitals:   11/29/21 1346 11/29/21 1400  BP: (!) 162/94 (!) 158/79  Pulse: (!) 107 94  Temp: 97.8 F (36.6 C)   TempSrc: Oral   SpO2: 99%   Weight: 184 lb 6.4 oz (83.6 kg)   Height: '5\' 5"'$  (1.651 m)     Exam General appearance : Awake, alert, not in any distress. Speech Clear. Not toxic looking HEENT: Atraumatic and Normocephalic, pupils equally reactive to light and accomodation Neck: Supple, no JVD. No cervical lymphadenopathy.  Chest: Good air entry bilaterally, no added sounds  CVS: S1 S2 regular, no murmurs.  Abdomen: Bowel sounds present, Non tender and not distended with no gaurding, rigidity or rebound. Extremities: B/L Lower Ext shows no edema, both legs are warm to touch Neurology: Awake alert, and oriented X  3, CN II-XII intact, Non focal Skin: No Rash  Data Review Lab Results  Component Value Date   HGBA1C 6.4 (A) 11/29/2021   HGBA1C 6.9 (A) 06/25/2021   HGBA1C 6.9 (A) 03/23/2021    Assessment & Plan   1. Colon cancer screening - Ambulatory referral to Gastroenterology  2. Type 2 diabetes mellitus without complication, without long-term current use of insulin (HCC) - HgB A1C 6.4 improving from 6.9 Continue to monitor foods that are high in carbohydrates are the following rice, potatoes, breads, sugars, and pastas.  Reduction in the intake (eating) will assist in lowering your blood sugars.  -  3. Essential hypertension BP goal - < 130/80 Explained that having normal blood pressure is the goal and medications are helping to get to goal and maintain normal blood pressure. DIET: Limit salt intake, read nutrition labels to check salt content, limit fried and high fatty foods  Avoid using multisymptom OTC cold preparations that generally contain sudafed which can rise BP. Consult with pharmacist on best cold relief products to use for persons with HTN EXERCISE Discussed incorporating exercise such as walking - 30 minutes most days of the week and can do in 10 minute intervals    - triamterene-hydrochlorothiazide (MAXZIDE-25) 37.5-25 MG tablet; Take 1 tablet by  mouth daily.  Dispense: 90 tablet; Refill: 1 - amLODipine (NORVASC) 10 MG tablet; Take 1 tablet (10 mg total) by mouth daily.  Dispense: 90 tablet; Refill: 1  4. Medication refill - triamterene-hydrochlorothiazide (MAXZIDE-25) 37.5-25 MG tablet; Take 1 tablet by mouth daily.  Dispense: 90 tablet; Refill: 1 insulin glargine (LANTUS SOLOSTAR) 100 UNIT/ML Solostar Pen; Inject 36 Units into the skin daily.  Dispense: 15 mL; Refill: PRN   Patient have been counseled extensively about nutrition and exercise. Other issues discussed during this visit include: low cholesterol diet, weight control and daily exercise, foot care, annual eye  examinations at Ophthalmology, importance of adherence with medications and regular follow-up. We also discussed long term complications of uncontrolled diabetes and hypertension.   Return in about 3 months (around 03/01/2022) for medical conditions.  The patient was given clear instructions to go to ER or return to medical center if symptoms don't improve, worsen or new problems develop. The patient verbalized understanding. The patient was told to call to get lab results if they haven't heard anything in the next week.   This note has been created with Surveyor, quantity. Any transcriptional errors are unintentional.   Kerin Perna, NP 12/13/2021, 4:30 PM

## 2021-12-25 ENCOUNTER — Other Ambulatory Visit (INDEPENDENT_AMBULATORY_CARE_PROVIDER_SITE_OTHER): Payer: Self-pay | Admitting: Primary Care

## 2021-12-25 DIAGNOSIS — E119 Type 2 diabetes mellitus without complications: Secondary | ICD-10-CM

## 2021-12-25 DIAGNOSIS — I10 Essential (primary) hypertension: Secondary | ICD-10-CM

## 2021-12-25 DIAGNOSIS — Z131 Encounter for screening for diabetes mellitus: Secondary | ICD-10-CM

## 2021-12-25 DIAGNOSIS — Z760 Encounter for issue of repeat prescription: Secondary | ICD-10-CM

## 2021-12-25 MED ORDER — INSULIN LISPRO (1 UNIT DIAL) 100 UNIT/ML (KWIKPEN): 6.0000 [IU] | PEN_INJECTOR | Freq: Three times a day (TID) | SUBCUTANEOUS | 3 refills | Status: DC

## 2021-12-25 MED ORDER — TRIAMTERENE-HCTZ 37.5-25 MG PO TABS: 1.0000 | ORAL_TABLET | Freq: Every day | ORAL | 1 refills | Status: DC

## 2021-12-25 MED ORDER — LANTUS SOLOSTAR 100 UNIT/ML ~~LOC~~ SOPN: 36.0000 [IU] | PEN_INJECTOR | Freq: Every day | SUBCUTANEOUS | 99 refills | Status: DC

## 2021-12-25 MED ORDER — AMLODIPINE BESYLATE 10 MG PO TABS: 10.0000 mg | ORAL_TABLET | Freq: Every day | ORAL | 1 refills | Status: DC

## 2022-01-03 ENCOUNTER — Ambulatory Visit: Payer: Medicare Other | Admitting: Pharmacist

## 2022-03-03 ENCOUNTER — Other Ambulatory Visit (INDEPENDENT_AMBULATORY_CARE_PROVIDER_SITE_OTHER): Payer: Self-pay | Admitting: Primary Care

## 2022-03-04 ENCOUNTER — Ambulatory Visit (INDEPENDENT_AMBULATORY_CARE_PROVIDER_SITE_OTHER): Payer: Medicare Other | Admitting: Primary Care

## 2022-03-04 VITALS — BP 146/90 | HR 98 | Temp 98.0°F | Ht 65.0 in | Wt 183.4 lb

## 2022-03-04 DIAGNOSIS — E119 Type 2 diabetes mellitus without complications: Secondary | ICD-10-CM | POA: Diagnosis not present

## 2022-03-04 DIAGNOSIS — I10 Essential (primary) hypertension: Secondary | ICD-10-CM | POA: Diagnosis not present

## 2022-03-04 DIAGNOSIS — Z1211 Encounter for screening for malignant neoplasm of colon: Secondary | ICD-10-CM

## 2022-03-04 NOTE — Progress Notes (Signed)
[place], is a 61 y.o. male  [id number]:[id number]  [id number]:[id number]  DOB - 1960-11-29  Chief Complaint  Patient presents with   Follow-up       Subjective:   [name] is a 61 y.o. male here today for a follow up visit for hypertension.  Patient has No headache, No chest pain, No abdominal pain - No Nausea, No new weakness tingling or numbness, No Cough - shortness of breath  No problems updated.  No Known Allergies  Past Medical History:  Diagnosis Date   Diabetes mellitus    Hypertension    Stroke [facility])     Current Outpatient Medications on File Prior to Visit  Medication Sig Dispense Refill   ASPIRIN LOW DOSE 81 MG EC tablet Take 81 mg by mouth daily.     atorvastatin (LIPITOR) 40 MG tablet Take 1 tablet (40 mg total) by mouth daily. 90 tablet 3   clopidogrel (PLAVIX) 75 MG tablet Take 75 mg by mouth daily.     insulin glargine (LANTUS SOLOSTAR) 100 UNIT/ML Solostar Pen Inject 36 Units into the skin daily. 15 mL PRN   insulin lispro (HUMALOG KWIKPEN) 100 UNIT/ML KwikPen Inject 6 Units into the skin 3 (three) times daily. 3 mL 3   meloxicam (MOBIC) 15 MG tablet Take 15 mg by mouth daily as needed.     triamterene-hydrochlorothiazide (MAXZIDE-25) 37.5-25 MG tablet Take 1 tablet by mouth daily. 90 tablet 1   amLODipine (NORVASC) 10 MG tablet Take 1 tablet (10 mg total) by mouth daily. 90 tablet 1   Current Facility-Administered Medications on File Prior to Visit  Medication Dose Route Frequency Provider Last Rate Last Admin   0.9 %  sodium chloride infusion  500 mL Intravenous Once Nelida Meuse III, MD        Objective:   Vitals:   03/04/22 1341  BP: (!) 146/90  Pulse: 98  Temp: 98 F (36.7 C)  TempSrc: Oral  SpO2: 95%  Weight: 183 lb 6.4 oz (83.2 kg)  Height: '5\' 5"'$  (1.651 m)    Exam General appearance : Awake, alert, not in any distress. Speech Clear. Not toxic looking HEENT: Atraumatic and Normocephalic, pupils  equally reactive to light and accomodation Neck: Supple, no JVD. No cervical lymphadenopathy.  Chest: Good air entry bilaterally, no added sounds  CVS: S1 S2 regular, no murmurs.  Abdomen: Bowel sounds present, Non tender and not distended with no gaurding, rigidity or rebound. Extremities: Right arm weakness and uses quad cane  Neurology: Awake alert, and oriented X 3, Non focal Skin: No Rash  Data Review Lab Results  Component Value Date   HGBA1C 6.4 (A) 11/29/2021   HGBA1C 6.9 (A) 06/25/2021   HGBA1C 6.9 (A) 03/23/2021    Assessment & Plan   1. Colon cancer screening GI referral   2. Essential hypertension Counseled on blood pressure goal of less than 130/80, low-sodium, DASH diet, medication compliance, 150 minutes of moderate intensity exercise per week. Discussed medication compliance, adverse effects.   3. Type 2 diabetes mellitus without complication, without long-term current use of insulin (HCC) A1C 6.4 will check A1C in 3 moths  Patient have been counseled extensively about nutrition and exercise. Other issues discussed during this visit include: low cholesterol diet, weight control and daily exercise, foot care, annual eye examinations at Ophthalmology, importance of adherence with medications and regular follow-up. We also discussed long term complications of uncontrolled diabetes and hypertension.   Return in about  3 months (around 06/04/2022) for fasting.  The patient was given clear instructions to go to ER or return to medical center if symptoms don't improve, worsen or new problems develop. The patient verbalized understanding. The patient was told to call to get lab results if they haven't heard anything in the next week.   This note has been created with Surveyor, quantity. Any transcriptional errors are unintentional.   Adam Perna, NP 03/04/2022, 7:09 PM

## 2022-03-05 LAB — MICROALBUMIN, URINE: Microalbumin, Urine: 143.4 ug/mL

## 2022-03-06 ENCOUNTER — Telehealth (INDEPENDENT_AMBULATORY_CARE_PROVIDER_SITE_OTHER): Payer: Self-pay

## 2022-03-06 NOTE — Telephone Encounter (Signed)
-----   Message from Kerin Perna, NP sent at 03/06/2022  9:20 AM EDT ----- Normal microalbumin

## 2022-03-06 NOTE — Telephone Encounter (Signed)
Patient aware of normal microalbumin. Nat Christen, CMA

## 2022-04-02 ENCOUNTER — Telehealth: Payer: Self-pay

## 2022-04-02 NOTE — Telephone Encounter (Signed)
Pt.'s aunt called to review pt.'s medications. Verbalizes understanding.

## 2022-04-05 ENCOUNTER — Other Ambulatory Visit (INDEPENDENT_AMBULATORY_CARE_PROVIDER_SITE_OTHER): Payer: Self-pay

## 2022-04-05 DIAGNOSIS — I10 Essential (primary) hypertension: Secondary | ICD-10-CM

## 2022-04-05 DIAGNOSIS — Z131 Encounter for screening for diabetes mellitus: Secondary | ICD-10-CM

## 2022-04-05 MED ORDER — AMLODIPINE BESYLATE 10 MG PO TABS: 10.0000 mg | ORAL_TABLET | Freq: Every day | ORAL | 1 refills | Status: DC

## 2022-04-10 ENCOUNTER — Other Ambulatory Visit (INDEPENDENT_AMBULATORY_CARE_PROVIDER_SITE_OTHER): Payer: Self-pay | Admitting: Primary Care

## 2022-04-10 DIAGNOSIS — Z760 Encounter for issue of repeat prescription: Secondary | ICD-10-CM

## 2022-04-10 DIAGNOSIS — I10 Essential (primary) hypertension: Secondary | ICD-10-CM

## 2022-04-10 DIAGNOSIS — E119 Type 2 diabetes mellitus without complications: Secondary | ICD-10-CM

## 2022-04-10 NOTE — Telephone Encounter (Signed)
Rx 12/25/21 #90 1RF- too soon Requested Prescriptions  Pending Prescriptions Disp Refills  . triamterene-hydrochlorothiazide (MAXZIDE-25) 37.5-25 MG tablet [Pharmacy Med Name: Triamterene-HCTZ 37.5-25 MG Oral Tablet] 100 tablet 2    Sig: TAKE 1 TABLET BY MOUTH DAILY     Cardiovascular: Diuretic Combos Failed - 04/10/2022  8:50 AM      Failed - K in normal range and within 180 days    Potassium  Date Value Ref Range Status  07/25/2021 4.2 3.5 - 5.2 mmol/L Final         Failed - Na in normal range and within 180 days    Sodium  Date Value Ref Range Status  07/25/2021 138 134 - 144 mmol/L Final         Failed - Cr in normal range and within 180 days    Creatinine, Ser  Date Value Ref Range Status  07/25/2021 1.77 (H) 0.76 - 1.27 mg/dL Final         Failed - Last BP in normal range    BP Readings from Last 1 Encounters:  03/04/22 (!) 146/90         Passed - Valid encounter within last 6 months    Recent Outpatient Visits          1 month ago Colon cancer screening   [place], Michelle P, NP   4 months ago Colon cancer screening   [place], Michelle P, NP   6 months ago Essential hypertension   [place] Kerin Perna, NP   8 months ago Type 2 diabetes mellitus with other specified complication, with long-term current use of insulin [facility])   [place], Annie Main L, RPH-CPP   9 months ago Type 2 diabetes mellitus without complication, without long-term current use of insulin ([place])   [facility] Kerin Perna, NP      Future Appointments            In 1 month Oletta Lamas, Milford Cage, NP [place]

## 2022-04-17 ENCOUNTER — Other Ambulatory Visit: Payer: Self-pay | Admitting: Pharmacist

## 2022-04-17 ENCOUNTER — Ambulatory Visit (INDEPENDENT_AMBULATORY_CARE_PROVIDER_SITE_OTHER): Payer: Medicare Other | Admitting: Primary Care

## 2022-04-17 ENCOUNTER — Encounter (INDEPENDENT_AMBULATORY_CARE_PROVIDER_SITE_OTHER): Payer: Self-pay | Admitting: Primary Care

## 2022-04-17 VITALS — BP 137/74 | HR 80 | Resp 16 | Wt 175.8 lb

## 2022-04-17 DIAGNOSIS — Z79899 Other long term (current) drug therapy: Secondary | ICD-10-CM | POA: Diagnosis not present

## 2022-04-17 DIAGNOSIS — Z794 Long term (current) use of insulin: Secondary | ICD-10-CM

## 2022-04-17 MED ORDER — FREESTYLE LIBRE 2 READER DEVI: 0 refills | Status: DC

## 2022-04-17 MED ORDER — FREESTYLE LIBRE 2 SENSOR MISC: 3 refills | Status: DC

## 2022-04-17 NOTE — Progress Notes (Signed)
[place], is a 61 y.o. male  [id number]  [id number]  DOB - 06-30-1961  Chief Complaint  Patient presents with   medication review       Subjective:   Adam Callahan is a 61 y.o. male here today for a follow up visit. Patient has No headache, No chest pain, No abdominal pain - No Nausea, No new weakness tingling or numbness, No Cough - shortness of breath.  Patient presenting with his son who is now taking care of him.  He wants to understand what medication he needs when to take him and the side effects.  They present with a bag full of medicine.    No problems updated.  No Known Allergies  Past Medical History:  Diagnosis Date   Diabetes mellitus    Hypertension    Stroke [facility])     Current Outpatient Medications on File Prior to Visit  Medication Sig Dispense Refill   amLODipine (NORVASC) 10 MG tablet Take 1 tablet (10 mg total) by mouth daily. 90 tablet 1   ASPIRIN LOW DOSE 81 MG EC tablet Take 81 mg by mouth daily.     atorvastatin (LIPITOR) 40 MG tablet Take 1 tablet (40 mg total) by mouth daily. 90 tablet 3   clopidogrel (PLAVIX) 75 MG tablet Take 75 mg by mouth daily.     insulin glargine (LANTUS SOLOSTAR) 100 UNIT/ML Solostar Pen Inject 36 Units into the skin daily. 15 mL PRN   insulin lispro (HUMALOG KWIKPEN) 100 UNIT/ML KwikPen Inject 6 Units into the skin 3 (three) times daily. 3 mL 3   meloxicam (MOBIC) 15 MG tablet Take 15 mg by mouth daily as needed.     triamterene-hydrochlorothiazide (MAXZIDE-25) 37.5-25 MG tablet Take 1 tablet by mouth daily. 90 tablet 1   Current Facility-Administered Medications on File Prior to Visit  Medication Dose Route Frequency Provider Last Rate Last Admin   0.9 %  sodium chloride infusion  500 mL Intravenous Once Nelida Meuse III, MD        Objective:   Vitals:   04/17/22 1538  BP: 137/74  Pulse: 80  Resp: 16  SpO2: 96%  Weight: 175 lb 12.8 oz (79.7 kg)    Exam General  appearance : Awake, alert, not in any distress. Speech Clear. Not toxic looking HEENT: Atraumatic and Normocephalic, pupils equally reactive to light and accomodation Neck: Supple, no JVD. No cervical lymphadenopathy.  Chest: Good air entry bilaterally, no added sounds  CVS: S1 S2 regular, no murmurs.  Abdomen: Bowel sounds present, Non tender and not distended with no gaurding, rigidity or rebound. Extremities: B/L Lower Ext shows no edema, both legs are warm to touch Neurology: Awake alert, and oriented X 3, CN II-XII intact, Non focal Skin: No Rash  Data Review Lab Results  Component Value Date   HGBA1C 6.4 (A) 11/29/2021   HGBA1C 6.9 (A) 06/25/2021   HGBA1C 6.9 (A) 03/23/2021    Assessment & Plan   1. Encounter for medication review Reviewed all medication patient has several bottles of the same medication labeled them for a.m. and p.m. also label the medications from what they were taking for diagnosis.  Father and son was very appreciative of the appointment.    Patient have been counseled extensively about nutrition and exercise. Other issues discussed during this visit include: low cholesterol diet, weight control and daily exercise, foot care, annual eye examinations at Ophthalmology, importance of adherence with medications and regular follow-up. We  also discussed long term complications of uncontrolled diabetes and hypertension.   Return in about 2 months (around 06/17/2022) for A1C /DM.  The patient was given clear instructions to go to ER or return to medical center if symptoms don't improve, worsen or new problems develop. The patient verbalized understanding. The patient was told to call to get lab results if they haven't heard anything in the next week.   This note has been created with Surveyor, quantity. Any transcriptional errors are unintentional.   Kerin Perna, NP 04/17/2022, 10:02 PM

## 2022-04-18 ENCOUNTER — Ambulatory Visit: Payer: Medicare Other | Admitting: Pharmacist

## 2022-05-24 LAB — COLOGUARD: COLOGUARD: NEGATIVE

## 2022-06-04 ENCOUNTER — Ambulatory Visit (INDEPENDENT_AMBULATORY_CARE_PROVIDER_SITE_OTHER): Payer: Medicare Other | Admitting: Primary Care

## 2022-06-04 ENCOUNTER — Other Ambulatory Visit (INDEPENDENT_AMBULATORY_CARE_PROVIDER_SITE_OTHER): Payer: Self-pay | Admitting: Primary Care

## 2022-06-04 ENCOUNTER — Encounter (INDEPENDENT_AMBULATORY_CARE_PROVIDER_SITE_OTHER): Payer: Self-pay | Admitting: Primary Care

## 2022-06-04 VITALS — BP 170/98 | HR 90 | Resp 16 | Wt 183.4 lb

## 2022-06-04 DIAGNOSIS — I10 Essential (primary) hypertension: Secondary | ICD-10-CM

## 2022-06-04 DIAGNOSIS — E119 Type 2 diabetes mellitus without complications: Secondary | ICD-10-CM | POA: Diagnosis not present

## 2022-06-04 DIAGNOSIS — Z1211 Encounter for screening for malignant neoplasm of colon: Secondary | ICD-10-CM | POA: Diagnosis not present

## 2022-06-04 MED ORDER — HYDRALAZINE HCL 25 MG PO TABS: 25.0000 mg | ORAL_TABLET | Freq: Three times a day (TID) | ORAL | 1 refills | Status: DC

## 2022-06-05 ENCOUNTER — Other Ambulatory Visit (INDEPENDENT_AMBULATORY_CARE_PROVIDER_SITE_OTHER): Payer: Self-pay | Admitting: Primary Care

## 2022-06-05 DIAGNOSIS — Z131 Encounter for screening for diabetes mellitus: Secondary | ICD-10-CM

## 2022-06-05 DIAGNOSIS — I10 Essential (primary) hypertension: Secondary | ICD-10-CM

## 2022-06-05 LAB — POCT GLYCOSYLATED HEMOGLOBIN (HGB A1C): HbA1c, POC (controlled diabetic range): 9.9 % — AB (ref 0.0–7.0)

## 2022-06-05 NOTE — Telephone Encounter (Signed)
Requested medication (s) are due for refill today:   Yes  Requested medication (s) are on the active medication list:   Yes  Future visit scheduled:   Yes   Last ordered: 06/04/2022 #90, 1 refill  Returned because a 90 day supply is being requested also labs are due per protocol   Requested Prescriptions  Pending Prescriptions Disp Refills   hydrALAZINE (APRESOLINE) 25 MG tablet [Pharmacy Med Name: HYDRALAZINE  '25MG'$  TABLETS(ORANGE)] 270 tablet     Sig: TAKE 1 TABLET(25 MG) BY MOUTH THREE TIMES DAILY     Cardiovascular:  Vasodilators Failed - 06/04/2022  5:19 PM      Failed - HCT in normal range and within 360 days    HCT  Date Value Ref Range Status  04/22/2021 43.0 39.0 - 52.0 % Final   Hematocrit  Date Value Ref Range Status  03/23/2021 39.6 37.5 - 51.0 % Final         Failed - HGB in normal range and within 360 days    Hemoglobin  Date Value Ref Range Status  04/22/2021 14.6 13.0 - 17.0 g/dL Final  03/23/2021 13.0 13.0 - 17.7 g/dL Final         Failed - RBC in normal range and within 360 days    RBC  Date Value Ref Range Status  04/22/2021 4.71 4.22 - 5.81 MIL/uL Final         Failed - WBC in normal range and within 360 days    WBC  Date Value Ref Range Status  04/22/2021 6.5 4.0 - 10.5 K/uL Final         Failed - PLT in normal range and within 360 days    Platelets  Date Value Ref Range Status  04/22/2021 331 150 - 400 K/uL Final  03/23/2021 345 150 - 450 x10E3/uL Final         Failed - ANA Screen, Ifa, Serum in normal range and within 360 days    Anti Nuclear Antibody(ANA)  Date Value Ref Range Status  05/03/2014 NEGATIVE NEGATIVE Final    Comment:    Performed at [facility]         Failed - Last BP in normal range    BP Readings from Last 1 Encounters:  06/04/22 (!) 170/98         Passed - Valid encounter within last 12 months    Recent Outpatient Visits           Yesterday Type 2 diabetes mellitus without complication, without  long-term current use of insulin ([place])   [place] [facility] Kerin Perna, NP   1 month ago Encounter for medication review   [place], Michelle P, NP   3 months ago Colon cancer screening   [place], Michelle P, NP   6 months ago Colon cancer screening   [place], Michelle P, NP   8 months ago Essential hypertension   [place] Kerin Perna, NP       Future Appointments             In 4 weeks Kerin Perna, NP [place]

## 2022-06-06 NOTE — Telephone Encounter (Signed)
Rx 04/05/22 #90 1RF- too soon Requested Prescriptions  Pending Prescriptions Disp Refills   amLODipine (NORVASC) 10 MG tablet [Pharmacy Med Name: amLODIPine Besylate 10 MG Oral Tablet] 100 tablet 2    Sig: TAKE 1 TABLET BY MOUTH DAILY     Cardiovascular: Calcium Channel Blockers 2 Failed - 06/05/2022 10:28 PM      Failed - Last BP in normal range    BP Readings from Last 1 Encounters:  06/04/22 (!) 170/98         Passed - Last Heart Rate in normal range    Pulse Readings from Last 1 Encounters:  06/04/22 90         Passed - Valid encounter within last 6 months    Recent Outpatient Visits           2 days ago Type 2 diabetes mellitus without complication, without long-term current use of insulin ([place])   [place] [facility] Kerin Perna, NP   1 month ago Encounter for medication review   [place], Michelle P, NP   3 months ago Colon cancer screening   [place], Michelle P, NP   6 months ago Colon cancer screening   [place], Michelle P, NP   8 months ago Essential hypertension   [place], [place], NP       Future Appointments             In 3 weeks Oletta Lamas, Milford Cage, NP [place]

## 2022-06-08 ENCOUNTER — Other Ambulatory Visit (INDEPENDENT_AMBULATORY_CARE_PROVIDER_SITE_OTHER): Payer: Self-pay | Admitting: Primary Care

## 2022-06-10 NOTE — Progress Notes (Signed)
Subjective:  [name], male    DOB: 09-Jun-1961  Age: 61 y.o. MRN: [id number]  CC: Diabetes and Hypertension   HPI [name] presents forFollow-up of diabetes. Patient does not check blood sugar at home  Compliant with meds - Yes Exercising regularly? - Yes Watching carbohydrate intake? - No- sometimes Neuropathy ? - No Hypoglycemic events - No  - Recovers with :   Pertinent ROS:  Polyuria - No Polydipsia - No Vision problems - No Management of Hypertension uncontrolled.  Patient has No headache, No chest pain, No abdominal pain - No Nausea, No new weakness tingling or numbness, No Cough - shortness of breath  Medications as noted below. Taking them regularly without complication/adverse reaction being reported today.   History Walton has a past medical history of Diabetes mellitus, Hypertension, and Stroke ([place]).   He has a past surgical history that includes TEE without cardioversion (N/A, 05/03/2014).   His family history includes Cervical cancer in his mother; Colon cancer in his maternal aunt; Diabetes Mellitus II in his maternal uncle; Hypertension in his sister.He reports that he has never smoked. He has never used smokeless tobacco. He reports that he does not drink alcohol and does not use drugs.  Current Outpatient Medications on File Prior to Visit  Medication Sig Dispense Refill   amLODipine (NORVASC) 10 MG tablet Take 1 tablet (10 mg total) by mouth daily. 90 tablet 1   ASPIRIN LOW DOSE 81 MG EC tablet Take 81 mg by mouth daily.     atorvastatin (LIPITOR) 40 MG tablet Take 1 tablet (40 mg total) by mouth daily. 90 tablet 3   clopidogrel (PLAVIX) 75 MG tablet Take 75 mg by mouth daily.     Continuous Blood Gluc Receiver (FREESTYLE LIBRE 2 READER) DEVI Check blood sugar three times daily. E11.69 1 each 0   Continuous Blood Gluc Sensor (FREESTYLE LIBRE 2 SENSOR) MISC Check blood sugar three times daily. E11.69 2 each 3   insulin glargine  (LANTUS SOLOSTAR) 100 UNIT/ML Solostar Pen Inject 36 Units into the skin daily. 15 mL PRN   insulin lispro (HUMALOG KWIKPEN) 100 UNIT/ML KwikPen Inject 6 Units into the skin 3 (three) times daily. 3 mL 3   meloxicam (MOBIC) 15 MG tablet Take 15 mg by mouth daily as needed.     triamterene-hydrochlorothiazide (MAXZIDE-25) 37.5-25 MG tablet Take 1 tablet by mouth daily. 90 tablet 1   Current Facility-Administered Medications on File Prior to Visit  Medication Dose Route Frequency Provider Last Rate Last Admin   0.9 %  sodium chloride infusion  500 mL Intravenous Once Danis, Estill Cotta III, MD        ROS Comprehensive ROS Pertinent positive and negative noted in HPI   Objective:  BP (!) 170/98 (BP Location: Left Arm, Patient Position: Sitting)   Pulse 90   Resp 16   Wt 183 lb 6.4 oz (83.2 kg)   SpO2 98%   BMI 30.52 kg/m   BP Readings from Last 3 Encounters:  06/04/22 (!) 170/98  04/17/22 137/74  03/04/22 (!) 146/90    Wt Readings from Last 3 Encounters:  06/04/22 183 lb 6.4 oz (83.2 kg)  04/17/22 175 lb 12.8 oz (79.7 kg)  03/04/22 183 lb 6.4 oz (83.2 kg)    Physical Exam General: No apparent distress.Obese male  Eyes: Extraocular eye movements intact, pupils equal and round. Neck: Supple, trachea midline. Thyroid: No enlargement, mobile without fixation, no tenderness. Cardiovascular: Regular rhythm and  rate, no murmur, normal radial pulses. Respiratory: Normal respiratory effort, clear to auscultation. Gastrointestinal: Normal pitch active bowel sounds, nontender abdomen without distention or appreciable hepatomegaly. Musculoskeletal: Normal muscle tone, no tenderness on palpation of tibia, no excessive thoracic kyphosis. Skin: Appropriate warmth, no visible rash. Mental status: Alert, conversant, speech clear, thought logical, appropriate mood and affect, no hallucinations or delusions evident. Hematologic/lymphatic: No cervical adenopathy, no visible ecchymoses.  Lab Results   Component Value Date   HGBA1C 9.9 (A) 06/04/2022   HGBA1C 6.4 (A) 11/29/2021   HGBA1C 6.9 (A) 06/25/2021    Lab Results  Component Value Date   WBC 6.5 04/22/2021   HGB 14.6 04/22/2021   HCT 43.0 04/22/2021   PLT 331 04/22/2021   GLUCOSE 171 (H) 07/25/2021   CHOL 115 07/25/2021   TRIG 54 07/25/2021   HDL 42 07/25/2021   LDLCALC 61 07/25/2021   ALT 17 07/25/2021   AST 18 07/25/2021   NA 138 07/25/2021   K 4.2 07/25/2021   CL 98 07/25/2021   CREATININE 1.77 (H) 07/25/2021   BUN 29 (H) 07/25/2021   CO2 26 07/25/2021   TSH 0.478 06/06/2013   INR 1.04 04/27/2014   HGBA1C 9.9 (A) 06/04/2022     Assessment & Plan:   Buell was seen today for diabetes and hypertension.  Diagnoses and all orders for this visit:  Type 2 diabetes mellitus without complication, without long-term current use of insulin (HCC) -     POCT glycosylated hemoglobin (Hb A1C) 9.9 Discussed  co- morbidities with uncontrol diabetes  Complications -diabetic retinopathy, (close your eyes ? What do you see nothing) nephropathy decrease in kidney function- can lead to dialysis-on a machine 3 days a week to filter your kidney, neuropathy- numbness and tinging in your hands and feet,  increase risk of heart attack and stroke, and amputation due to decrease wound healing and circulation. Decrease your risk by taking medication daily as prescribed, monitor carbohydrates- foods that are high in carbohydrates are the following rice, potatoes, breads, sugars, and pastas.  Reduction in the intake (eating) will assist in lowering your blood sugars. Exercise daily at least 30 minutes daily.   Essential hypertension BP goal - < 140/90 Explained that having normal blood pressure is the goal and medications are helping to get to goal and maintain normal blood pressure. DIET: Limit salt intake, read nutrition labels to check salt content, limit fried and high fatty foods  Avoid using multisymptom OTC cold preparations that  generally contain sudafed which can rise BP. Consult with pharmacist on best cold relief products to use for persons with HTN EXERCISE Discussed incorporating exercise such as walking - 30 minutes most days of the week and can do in 10 minute intervals    -     hydrALAZINE (APRESOLINE) 25 MG tablet; Take 1 tablet (25 mg total) by mouth 3 (three) times daily.  Colon cancer screening  Ambulatory referral to Gastroenterology   I am having Elberta Fortis A. Scheff start on hydrALAZINE. I am also having him maintain his Aspirin Low Dose, clopidogrel, atorvastatin, meloxicam, Lantus SoloStar, insulin lispro, triamterene-hydrochlorothiazide, amLODipine, FreeStyle Libre 2 Reader, and [employer] 2 Sensor. We will continue to administer sodium chloride.  Meds ordered this encounter  Medications   hydrALAZINE (APRESOLINE) 25 MG tablet    Sig: Take 1 tablet (25 mg total) by mouth 3 (three) times daily.    Dispense:  90 tablet    Refill:  1    Order Specific Question:  Supervising Provider    Answer:   Tresa Garter [[id number]]     Follow-up:   Return for first [place].  The above assessment and management plan was discussed with the patient. The patient verbalized understanding of and has agreed to the management plan. Patient is aware to call the clinic if symptoms fail to improve or worsen. Patient is aware when to return to the clinic for a follow-up visit. Patient educated on when it is appropriate to go to the emergency department.   Juluis Mire, NP-C

## 2022-06-10 NOTE — Telephone Encounter (Signed)
Will forward to provider  

## 2022-06-19 ENCOUNTER — Other Ambulatory Visit (INDEPENDENT_AMBULATORY_CARE_PROVIDER_SITE_OTHER): Payer: Self-pay | Admitting: Primary Care

## 2022-06-19 DIAGNOSIS — I10 Essential (primary) hypertension: Secondary | ICD-10-CM

## 2022-06-19 MED ORDER — ATORVASTATIN CALCIUM 40 MG PO TABS: 40.0000 mg | ORAL_TABLET | Freq: Every day | ORAL | 0 refills | Status: DC

## 2022-06-19 NOTE — Telephone Encounter (Signed)
Requested Prescriptions  Pending Prescriptions Disp Refills   hydrALAZINE (APRESOLINE) 25 MG tablet 90 tablet 1    Sig: Take 1 tablet (25 mg total) by mouth 3 (three) times daily.     Cardiovascular:  Vasodilators Failed - 06/19/2022  3:19 PM      Failed - HCT in normal range and within 360 days    HCT  Date Value Ref Range Status  04/22/2021 43.0 39.0 - 52.0 % Final   Hematocrit  Date Value Ref Range Status  03/23/2021 39.6 37.5 - 51.0 % Final         Failed - HGB in normal range and within 360 days    Hemoglobin  Date Value Ref Range Status  04/22/2021 14.6 13.0 - 17.0 g/dL Final  03/23/2021 13.0 13.0 - 17.7 g/dL Final         Failed - RBC in normal range and within 360 days    RBC  Date Value Ref Range Status  04/22/2021 4.71 4.22 - 5.81 MIL/uL Final         Failed - WBC in normal range and within 360 days    WBC  Date Value Ref Range Status  04/22/2021 6.5 4.0 - 10.5 K/uL Final         Failed - PLT in normal range and within 360 days    Platelets  Date Value Ref Range Status  04/22/2021 331 150 - 400 K/uL Final  03/23/2021 345 150 - 450 x10E3/uL Final         Failed - ANA Screen, Ifa, Serum in normal range and within 360 days    Anti Nuclear Antibody(ANA)  Date Value Ref Range Status  05/03/2014 NEGATIVE NEGATIVE Final    Comment:    Performed at [facility]         Failed - Last BP in normal range    BP Readings from Last 1 Encounters:  06/04/22 (!) 170/98         Passed - Valid encounter within last 12 months    Recent Outpatient Visits           2 weeks ago Type 2 diabetes mellitus without complication, without long-term current use of insulin ([place])   [place] [facility] Kerin Perna, NP   2 months ago Encounter for medication review   [place], Michelle P, NP   3 months ago Colon cancer screening   [place], Michelle P, NP   6 months ago Colon  cancer screening   [place], Michelle P, NP   8 months ago Essential hypertension   [place] Kerin Perna, NP       Future Appointments             In 2 weeks Oletta Lamas, [place] Cage, NP [facility]             atorvastatin (LIPITOR) 40 MG tablet 90 tablet 0    Sig: Take 1 tablet (40 mg total) by mouth daily.     Cardiovascular:  Antilipid - Statins Failed - 06/19/2022  3:19 PM      Failed - Lipid Panel in normal range within the last 12 months    Cholesterol, Total  Date Value Ref Range Status  07/25/2021 115 100 - 199 mg/dL Final   LDL Chol Calc (NIH)  Date Value Ref Range Status  07/25/2021  61 0 - 99 mg/dL Final   HDL  Date Value Ref Range Status  07/25/2021 42 >39 mg/dL Final   Triglycerides  Date Value Ref Range Status  07/25/2021 54 0 - 149 mg/dL Final         Passed - Patient is not pregnant      Passed - Valid encounter within last 12 months    Recent Outpatient Visits           2 weeks ago Type 2 diabetes mellitus without complication, without long-term current use of insulin ([place])   [place] [facility] Kerin Perna, NP   2 months ago Encounter for medication review   [place], Michelle P, NP   3 months ago Colon cancer screening   [place], Michelle P, NP   6 months ago Colon cancer screening   [place], Michelle P, NP   8 months ago Essential hypertension   [place], [place], NP       Future Appointments             In 2 weeks Oletta Lamas, Mil[place] Cage, NP [place]

## 2022-06-19 NOTE — Telephone Encounter (Signed)
Unable to refill per protocol, last refill by provider 06/04/22. Will refuse duplicate request.  Requested Prescriptions  Pending Prescriptions Disp Refills   hydrALAZINE (APRESOLINE) 25 MG tablet 90 tablet 1    Sig: Take 1 tablet (25 mg total) by mouth 3 (three) times daily.     Cardiovascular:  Vasodilators Failed - 06/19/2022  3:19 PM      Failed - HCT in normal range and within 360 days    HCT  Date Value Ref Range Status  04/22/2021 43.0 39.0 - 52.0 % Final   Hematocrit  Date Value Ref Range Status  03/23/2021 39.6 37.5 - 51.0 % Final         Failed - HGB in normal range and within 360 days    Hemoglobin  Date Value Ref Range Status  04/22/2021 14.6 13.0 - 17.0 g/dL Final  03/23/2021 13.0 13.0 - 17.7 g/dL Final         Failed - RBC in normal range and within 360 days    RBC  Date Value Ref Range Status  04/22/2021 4.71 4.22 - 5.81 MIL/uL Final         Failed - WBC in normal range and within 360 days    WBC  Date Value Ref Range Status  04/22/2021 6.5 4.0 - 10.5 K/uL Final         Failed - PLT in normal range and within 360 days    Platelets  Date Value Ref Range Status  04/22/2021 331 150 - 400 K/uL Final  03/23/2021 345 150 - 450 x10E3/uL Final         Failed - ANA Screen, Ifa, Serum in normal range and within 360 days    Anti Nuclear Antibody(ANA)  Date Value Ref Range Status  05/03/2014 NEGATIVE NEGATIVE Final    Comment:    Performed at [facility]         Failed - Last BP in normal range    BP Readings from Last 1 Encounters:  06/04/22 (!) 170/98         Passed - Valid encounter within last 12 months    Recent Outpatient Visits           2 weeks ago Type 2 diabetes mellitus without complication, without long-term current use of insulin ([place])   [place] [facility] Kerin Perna, NP   2 months ago Encounter for medication review   [place], Michelle P, NP   3 months ago Colon  cancer screening   [place], Michelle P, NP   6 months ago Colon cancer screening   [place], Michelle P, NP   8 months ago Essential hypertension   [place], Michelle P, NP       Future Appointments             In 2 weeks Oletta Lamas, Milford Cage, NP [facility]            Signed Prescriptions Disp Refills   atorvastatin (LIPITOR) 40 MG tablet 90 tablet 0    Sig: Take 1 tablet (40 mg total) by mouth daily.     Cardiovascular:  Antilipid - Statins Failed - 06/19/2022  3:19 PM      Failed - Lipid Panel in normal range within the last 12 months    Cholesterol, Total  Date Value Ref Range Status  07/25/2021  115 100 - 199 mg/dL Final   LDL Chol Calc (NIH)  Date Value Ref Range Status  07/25/2021 61 0 - 99 mg/dL Final   HDL  Date Value Ref Range Status  07/25/2021 42 >39 mg/dL Final   Triglycerides  Date Value Ref Range Status  07/25/2021 54 0 - 149 mg/dL Final         Passed - Patient is not pregnant      Passed - Valid encounter within last 12 months    Recent Outpatient Visits           2 weeks ago Type 2 diabetes mellitus without complication, without long-term current use of insulin ([place])   [place] [facility] Kerin Perna, NP   2 months ago Encounter for medication review   [place], Michelle P, NP   3 months ago Colon cancer screening   [place], Michelle P, NP   6 months ago Colon cancer screening   [place], Michelle P, NP   8 months ago Essential hypertension   [place], [place], NP       Future Appointments             In 2 weeks Oletta Lamas, Milford Cage, NP [place]

## 2022-06-19 NOTE — Telephone Encounter (Signed)
Copied from [place] [id number]. Topic: General - Other >> Jun 19, 2022  2:42 PM Everette C wrote: Reason for CRM: Medication Refill - Medication: atorvastatin (LIPITOR) 40 MG tablet [[id number]] - 100 day supply   hydrALAZINE (APRESOLINE) 25 MG tablet [[id number]] - 100 day supply   Has the patient contacted their pharmacy? Yes.  The patient's [employer] rep has made contact on their behalf and also contacted their pharmacy as well (Agent: If no, request that the patient contact the pharmacy for the refill. If patient does not wish to contact the pharmacy document the reason why and proceed with request.) (Agent: If yes, when and what did the pharmacy advise?)  Preferred Pharmacy (with phone number or street name): [facility] [place] [address] Phone: [phone] Fax: [phone] Hours: Not open 24 hours   Has the patient been seen for an appointment in the last year OR does the patient have an upcoming appointment? Yes.    Agent: Please be advised that RX refills may take up to 3 business days. We ask that you follow-up with your pharmacy.

## 2022-06-23 ENCOUNTER — Other Ambulatory Visit (INDEPENDENT_AMBULATORY_CARE_PROVIDER_SITE_OTHER): Payer: Self-pay | Admitting: Primary Care

## 2022-06-23 DIAGNOSIS — Z760 Encounter for issue of repeat prescription: Secondary | ICD-10-CM

## 2022-06-23 DIAGNOSIS — I10 Essential (primary) hypertension: Secondary | ICD-10-CM

## 2022-06-23 DIAGNOSIS — E119 Type 2 diabetes mellitus without complications: Secondary | ICD-10-CM

## 2022-06-23 MED ORDER — TRIAMTERENE-HCTZ 37.5-25 MG PO TABS: 1.0000 | ORAL_TABLET | Freq: Every day | ORAL | 1 refills | Status: DC

## 2022-06-25 ENCOUNTER — Other Ambulatory Visit: Payer: Self-pay | Admitting: Family Medicine

## 2022-06-25 DIAGNOSIS — Z794 Long term (current) use of insulin: Secondary | ICD-10-CM

## 2022-06-25 NOTE — Telephone Encounter (Signed)
Requested Prescriptions  Pending Prescriptions Disp Refills   Continuous Blood Gluc Sensor (FREESTYLE LIBRE 2 SENSOR) MISC [Pharmacy Med Name: FREESTYLE LIBRE_2 SENSOR] 2 each 3    Sig: CHECK BLOOD SUGAR 3 TIMES DAILY     Endocrinology: Diabetes - Testing Supplies Passed - 06/25/2022  9:05 AM      Passed - Valid encounter within last 12 months    Recent Outpatient Visits           3 weeks ago Type 2 diabetes mellitus without complication, without long-term current use of insulin ([place])   [place] [facility] Kerin Perna, NP   2 months ago Encounter for medication review   [place], Michelle P, NP   3 months ago Colon cancer screening   [place], Michelle P, NP   6 months ago Colon cancer screening   [place], Michelle P, NP   9 months ago Essential hypertension   [place] Kerin Perna, NP       Future Appointments             In 1 week Oletta Lamas [place] Cage, NP [place]

## 2022-07-03 ENCOUNTER — Ambulatory Visit (INDEPENDENT_AMBULATORY_CARE_PROVIDER_SITE_OTHER): Payer: Medicare Other | Admitting: Primary Care

## 2022-07-08 ENCOUNTER — Encounter (INDEPENDENT_AMBULATORY_CARE_PROVIDER_SITE_OTHER): Payer: Self-pay | Admitting: Primary Care

## 2022-07-08 ENCOUNTER — Ambulatory Visit (INDEPENDENT_AMBULATORY_CARE_PROVIDER_SITE_OTHER): Payer: Medicare Other | Admitting: Primary Care

## 2022-07-08 VITALS — BP 150/84 | HR 90 | Ht 65.0 in | Wt 184.6 lb

## 2022-07-08 DIAGNOSIS — I10 Essential (primary) hypertension: Secondary | ICD-10-CM

## 2022-07-08 DIAGNOSIS — L602 Onychogryphosis: Secondary | ICD-10-CM

## 2022-07-08 DIAGNOSIS — G8110 Spastic hemiplegia affecting unspecified side: Secondary | ICD-10-CM

## 2022-07-08 DIAGNOSIS — E119 Type 2 diabetes mellitus without complications: Secondary | ICD-10-CM

## 2022-07-08 DIAGNOSIS — Z2821 Immunization not carried out because of patient refusal: Secondary | ICD-10-CM

## 2022-07-08 NOTE — Progress Notes (Unsigned)
Adam Callahan, is a 61 y.o. male  JJO:[id number]  ZSW:[id number]  DOB - Sep 10, 1960  Chief Complaint  Patient presents with   Hypertension       Subjective:   Adam Callahan is a 61 y.o. male here today for a follow up visit for Bp. Patient has No headache, No chest pain, No abdominal pain - No Nausea, No new weakness tingling or numbness, No Cough - shortness of breath. Right sided hemiparesis.   No problems updated.  No Known Allergies  Past Medical History:  Diagnosis Date   Diabetes mellitus    Hypertension    Stroke [facility])     Current Outpatient Medications on File Prior to Visit  Medication Sig Dispense Refill   amLODipine (NORVASC) 10 MG tablet Take 1 tablet (10 mg total) by mouth daily. 90 tablet 1   ASPIRIN LOW DOSE 81 MG EC tablet Take 81 mg by mouth daily.     atorvastatin (LIPITOR) 40 MG tablet Take 1 tablet (40 mg total) by mouth daily. 90 tablet 0   clopidogrel (PLAVIX) 75 MG tablet Take 75 mg by mouth daily.     Continuous Blood Gluc Receiver (FREESTYLE LIBRE 2 READER) DEVI Check blood sugar three times daily. E11.69 1 each 0   Continuous Blood Gluc Sensor (FREESTYLE LIBRE 2 SENSOR) MISC CHECK BLOOD SUGAR 3 TIMES DAILY 2 each 3   HUMALOG KWIKPEN 100 UNIT/ML KwikPen INJECT SUBCUTANEOUSLY 6 UNITS 3  TIMES DAILY 15 mL 3   hydrALAZINE (APRESOLINE) 25 MG tablet Take 1 tablet (25 mg total) by mouth 3 (three) times daily. 90 tablet 1   insulin glargine (LANTUS SOLOSTAR) 100 UNIT/ML Solostar Pen Inject 36 Units into the skin daily. 15 mL PRN   meloxicam (MOBIC) 15 MG tablet Take 15 mg by mouth daily as needed.     triamterene-hydrochlorothiazide (MAXZIDE-25) 37.5-25 MG tablet Take 1 tablet by mouth daily. 90 tablet 1   Current Facility-Administered Medications on File Prior to Visit  Medication Dose Route Frequency Provider Last Rate Last Admin   0.9 %  sodium chloride infusion  500 mL Intravenous Once Nelida Meuse III, MD         Objective:   Vitals:   07/08/22 1400  BP: (Abnormal) 165/81  Pulse: 90  SpO2: 98%  Weight: 184 lb 9.6 oz (83.7 kg)  Height: '5\' 5"'$  (1.651 m)    Exam General appearance : Awake, alert, not in any distress. Speech Clear. Not toxic looking HEENT: Atraumatic and Normocephalic, pupils equally reactive to light and accomodation Neck: Supple, no JVD. No cervical lymphadenopathy.  Chest: Good air entry bilaterally, no added sounds  CVS: S1 S2 regular, no murmurs.  Abdomen: Bowel sounds present, Non tender and not distended with no gaurding, rigidity or rebound. Extremities: right sided hemiparesis B/L Lower Ext shows no edema, both legs are warm to touch Neurology: Awake alert, and oriented X 3, Non focal Skin: No Rash  Data Review Lab Results  Component Value Date   HGBA1C 9.9 (A) 06/04/2022   HGBA1C 6.4 (A) 11/29/2021   HGBA1C 6.9 (A) 06/25/2021    Assessment & Plan   1. Essential hypertension Blood pressure remains elevated systolic despite patient compliant with medication goal of therapy should be less than 140/90.  Last office visit referred to [place] not look like appointment was scheduled  2. Type 2 diabetes mellitus without complication, without long-term current use of insulin (HCC) 9.9 - educated on lifestyle modifications, including but not  limited to diet choices and adding exercise to daily routine.  Show Previous visit advised to follow-up with clinical pharmacist  3. Spastic hemiplegia affecting dominant side (HCC) History of CVA  4. Comprehensive diabetic foot examination, type 2 DM, encounter for [facility]) Completed refer to podiatry   5. Onychauxis  Referred to podiatry  Patient have been counseled extensively about nutrition and exercise. Other issues discussed during this visit include: low cholesterol diet, weight control and daily exercise, foot care, annual eye examinations at Ophthalmology, importance of adherence with medications and regular  follow-up. We also discussed long term complications of uncontrolled diabetes and hypertension.   Return in about 2 months (around 09/08/2022) for DM/HTN/fasting .  The patient was given clear instructions to go to ER or return to medical center if symptoms don't improve, worsen or new problems develop. The patient verbalized understanding. The patient was told to call to get lab results if they haven't heard anything in the next week.   This note has been created with Surveyor, quantity. Any transcriptional errors are unintentional.   Kerin Perna, NP 07/08/2022, 2:32 PM

## 2022-07-12 ENCOUNTER — Other Ambulatory Visit (INDEPENDENT_AMBULATORY_CARE_PROVIDER_SITE_OTHER): Payer: Self-pay | Admitting: Primary Care

## 2022-07-12 DIAGNOSIS — I10 Essential (primary) hypertension: Secondary | ICD-10-CM

## 2022-07-12 NOTE — Telephone Encounter (Signed)
Requested medication (s) are due for refill today: yes  Requested medication (s) are on the active medication list: yes  Last refill:  06/04/22 #90/1  Future visit scheduled: yes  Notes to clinic:  Unable to refill per protocol due to failed labs, no updated results.      Requested Prescriptions  Pending Prescriptions Disp Refills   hydrALAZINE (APRESOLINE) 25 MG tablet [Pharmacy Med Name: HYDRALAZINE  '25MG'$  TABLETS(ORANGE)] 270 tablet     Sig: TAKE 1 TABLET(25 MG) BY MOUTH THREE TIMES DAILY     Cardiovascular:  Vasodilators Failed - 07/12/2022 10:13 AM      Failed - HCT in normal range and within 360 days    HCT  Date Value Ref Range Status  04/22/2021 43.0 39.0 - 52.0 % Final   Hematocrit  Date Value Ref Range Status  03/23/2021 39.6 37.5 - 51.0 % Final         Failed - HGB in normal range and within 360 days    Hemoglobin  Date Value Ref Range Status  04/22/2021 14.6 13.0 - 17.0 g/dL Final  03/23/2021 13.0 13.0 - 17.7 g/dL Final         Failed - RBC in normal range and within 360 days    RBC  Date Value Ref Range Status  04/22/2021 4.71 4.22 - 5.81 MIL/uL Final         Failed - WBC in normal range and within 360 days    WBC  Date Value Ref Range Status  04/22/2021 6.5 4.0 - 10.5 K/uL Final         Failed - PLT in normal range and within 360 days    Platelets  Date Value Ref Range Status  04/22/2021 331 150 - 400 K/uL Final  03/23/2021 345 150 - 450 x10E3/uL Final         Failed - ANA Screen, Ifa, Serum in normal range and within 360 days    Anti Nuclear Antibody(ANA)  Date Value Ref Range Status  05/03/2014 NEGATIVE NEGATIVE Final    Comment:    Performed at [facility]         Failed - Last BP in normal range    BP Readings from Last 1 Encounters:  07/08/22 (!) 150/84         Passed - Valid encounter within last 12 months    Recent Outpatient Visits           4 days ago Essential hypertension   [place], Michelle P, NP   1 month ago Type 2 diabetes mellitus without complication, without long-term current use of insulin ([place])   [place], Michelle P, NP   2 months ago Encounter for medication review   [place], Michelle P, NP   4 months ago Colon cancer screening   [place], Michelle P, NP   7 months ago Colon cancer screening   [place], Michelle P, NP       Future Appointments             In 4 weeks Tresa Endo, [place]   In 2 months Oletta Lamas, Milford Cage, NP [place]

## 2022-07-24 ENCOUNTER — Encounter (HOSPITAL_COMMUNITY): Payer: Self-pay

## 2022-07-24 ENCOUNTER — Ambulatory Visit (HOSPITAL_COMMUNITY)
Admission: EM | Admit: 2022-07-24 | Discharge: 2022-07-24 | Disposition: A | Discharge status: Home / Self Care | Payer: Medicare Other | Attending: Family Medicine | Admitting: Family Medicine

## 2022-07-24 DIAGNOSIS — R059 Cough, unspecified: Secondary | ICD-10-CM | POA: Diagnosis not present

## 2022-07-24 DIAGNOSIS — J111 Influenza due to unidentified influenza virus with other respiratory manifestations: Secondary | ICD-10-CM | POA: Insufficient documentation

## 2022-07-24 DIAGNOSIS — U071 COVID-19: Secondary | ICD-10-CM | POA: Diagnosis not present

## 2022-07-24 DIAGNOSIS — J069 Acute upper respiratory infection, unspecified: Secondary | ICD-10-CM | POA: Diagnosis present

## 2022-07-24 DIAGNOSIS — Z79899 Other long term (current) drug therapy: Secondary | ICD-10-CM | POA: Insufficient documentation

## 2022-07-24 MED ORDER — ACETAMINOPHEN 325 MG PO TABS
650.0000 mg | ORAL_TABLET | Freq: Once | ORAL | Status: AC
  Administered 2022-07-24: 650 mg via ORAL

## 2022-07-24 MED ORDER — ACETAMINOPHEN 325 MG PO TABS
ORAL_TABLET | ORAL | Status: AC
  Filled 2022-07-24: qty 2

## 2022-07-24 MED ORDER — OSELTAMIVIR PHOSPHATE 30 MG PO CAPS: ORAL_CAPSULE | ORAL | 0 refills | Status: AC

## 2022-07-24 NOTE — ED Triage Notes (Signed)
Chief Complaint: dry cough, dizzy, headaches, chills. No runny nose. Slight sore throat was first. Felt feverish and fatigue, aching in the left leg.   Onset: last night   Prescriptions or OTC medications tried: No    Sick exposure: No  New foods, medications, or products: No  Recent Travel: No

## 2022-07-24 NOTE — ED Provider Notes (Signed)
[facility]    CSN: [id number] Arrival date & time: 07/24/22  1147      History   Chief Complaint Chief Complaint  Patient presents with   Cough   Nasal Congestion   Weakness    HPI Adam Callahan is a 62 y.o. male.    Cough Weakness Associated symptoms: cough    Here for fever and cough and congestion and feeling weak.  Symptoms began last night.  No vomiting or diarrhea or nausea.  He does feel dizzy and weak.  He does have a history of diabetes and hypertension and stroke.  Last EGFR in January of last year in epic was 69.    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[place]) 07/08/2014   Homonymous hemianopsia following cerebrovascular accident 06/06/2014   Acute left hemiparesis ([place]) 05/04/2014   Stroke ([place]) 04/30/2014   Spastic hemiplegia affecting dominant side ([place]) 10/01/2013   Aphasia as late effect of cerebrovascular accident 10/01/2013   Alterations of sensations, late effect of cerebrovascular disease(438.6) 94/70/9628   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 Authorizing Provider  amLODipine (NORVASC) 10 MG tablet Take 1 tablet (10 mg total) by mouth daily. 04/05/22  Yes Edwards, Michelle P, NP  ASPIRIN LOW DOSE 81 MG EC tablet Take 81 mg by mouth daily. 01/15/21  Yes [provider]  atorvastatin (LIPITOR) 40 MG tablet Take 1 tablet (40 mg total) by mouth daily. 06/19/22  Yes Kerin Perna, NP  clopidogrel (PLAVIX) 75 MG tablet Take 75 mg by mouth daily.   Yes [provider]  Continuous Blood Gluc Receiver (FREESTYLE LIBRE 2 READER) DEVI Check blood sugar three times daily. E11.69 04/17/22  Yes Charlott Rakes, MD  Continuous Blood Gluc Sensor (FREESTYLE LIBRE 2 SENSOR) MISC CHECK BLOOD SUGAR 3 TIMES DAILY 06/25/22  Yes Charlott Rakes, MD  HUMALOG KWIKPEN 100 UNIT/ML KwikPen INJECT SUBCUTANEOUSLY 6 UNITS 3  TIMES DAILY 06/12/22  Yes Kerin Perna, NP  hydrALAZINE (APRESOLINE) 25 MG tablet Take 1 tablet (25 mg total) by mouth 3 (three) times daily. 06/04/22  Yes Kerin Perna, NP  insulin glargine (LANTUS SOLOSTAR) 100 UNIT/ML Solostar Pen Inject 36 Units into the skin daily. 12/25/21  Yes Kerin Perna, NP  oseltamivir (TAMIFLU) 30 MG capsule Take 2 capsules by mouth the first dose and then take 30 mg twice daily for 4-1/2 more days 07/24/22 07/30/22 Yes Gunther Zawadzki, Gwenlyn Perking, MD  triamterene-hydrochlorothiazide (MAXZIDE-25) 37.5-25 MG tablet Take 1 tablet by mouth daily. 06/23/22  Yes Kerin Perna, NP    Family  History Family History  Problem Relation Age of Onset   Cervical cancer Mother    Hypertension Sister    Diabetes Mellitus II Maternal Uncle    Colon cancer Maternal Aunt     Social History Social History   Tobacco Use   Smoking status: Never   Smokeless tobacco: Never  Vaping Use   Vaping Use: Never used  Substance Use Topics   Alcohol use: No    Alcohol/week: 0.0 standard drinks of alcohol   Drug use: No     Allergies   Patient has no known  allergies.   Review of Systems Review of Systems  Respiratory:  Positive for cough.   Neurological:  Positive for weakness.     Physical Exam Triage Vital Signs ED Triage Vitals  Enc Vitals Group     BP 07/24/22 1353 (!) 161/82     Pulse Rate 07/24/22 1353 (!) 110     Resp 07/24/22 1353 16     Temp 07/24/22 1353 (!) 102.8 F (39.3 C)     Temp Source 07/24/22 1353 Oral     SpO2 07/24/22 1353 94 %     Weight --      Height --      Head Circumference --      Peak Flow --      Pain Score 07/24/22 1352 8     Pain Loc --      Pain Edu? --      Excl. in [place]? --    No data found.  Updated Vital Signs BP (!) 161/82 (BP Location: Right Arm)   Pulse (!) 110   Temp (!) 102.8 F (39.3 C) (Oral)   Resp 16   SpO2 94%   Visual Acuity Right Eye Distance:   Left Eye Distance:   Bilateral Distance:    Right Eye Near:   Left Eye Near:    Bilateral Near:     Physical Exam Vitals reviewed.  Constitutional:      General: He is not in acute distress.    Appearance: He is not toxic-appearing.  HENT:     Head:     Comments: There is some facial droop    Nose: Congestion present.     Mouth/Throat:     Mouth: Mucous membranes are moist.     Comments: There is clear mucus draining. Eyes:     Extraocular Movements: Extraocular movements intact.     Conjunctiva/sclera: Conjunctivae normal.     Pupils: Pupils are equal, round, and reactive to light.  Cardiovascular:     Rate and Rhythm: Normal rate and regular rhythm.     Heart sounds: No murmur heard. Pulmonary:     Effort: No respiratory distress.     Breath sounds: No stridor. No wheezing, rhonchi or rales.  Musculoskeletal:     Cervical back: Neck supple.  Lymphadenopathy:     Cervical: No cervical adenopathy.  Skin:    Capillary Refill: Capillary refill takes less than 2 seconds.     Coloration: Skin is not jaundiced or pale.  Neurological:     Mental Status: He is alert and oriented to person, place, and time.   Psychiatric:        Behavior: Behavior normal.      UC Treatments / Results  Labs (all labs ordered are listed, but only abnormal results are displayed) Labs Reviewed  SARS CORONAVIRUS 2 (TAT 6-24 HRS)    EKG   Radiology No results found.  Procedures  Procedures (including critical care time)  Medications Ordered in UC Medications  acetaminophen (TYLENOL) tablet 650 mg (has no administration in time range)    Initial Impression / Assessment and Plan / UC Course  I have reviewed the triage vital signs and the nursing notes.  Pertinent labs & imaging results that were available during my care of the patient were reviewed by me and considered in my medical decision making (see chart for details).        I am going to treat empirically for influenza-like illness with our shortage of flu test kits, and then COVID swab is done today.  If he is positive for COVID he can stop taking the Tamiflu and begin renal dosing of Paxlovid.  His last EGFR was 44 in our system; he will need to stop his atorvastatin while taking the Paxlovid  Tamiflu dosing is done according to up-to-date reference recommendations for his renal function Final Clinical Impressions(s) / UC Diagnoses   Final diagnoses:  Viral URI with cough  Influenza-like illness     Discharge Instructions      Oseltamivir 30 mg--he will take 2 capsules the first dose, and then from there the dosing is 1 capsule 2 times daily to complete 5 days course  Take Tylenol as needed for pain or fever   You have been swabbed for COVID, and the test will result in the next 24 hours. Our staff will call you if positive. If the COVID test is positive, you should quarantine for 5 days from the start of your symptoms      ED Prescriptions     Medication Sig Dispense Auth. Provider   oseltamivir (TAMIFLU) 30 MG capsule Take 2 capsules by mouth the first dose and then take 30 mg twice daily for 4-1/2 more days 11 capsule  Arieana Somoza, Gwenlyn Perking, MD      PDMP not reviewed this encounter.   Barrett Henle, MD 07/24/22 [id number]

## 2022-07-24 NOTE — Discharge Instructions (Signed)
Oseltamivir 30 mg--he will take 2 capsules the first dose, and then from there the dosing is 1 capsule 2 times daily to complete 5 days course  Take Tylenol as needed for pain or fever   You have been swabbed for COVID, and the test will result in the next 24 hours. Our staff will call you if positive. If the COVID test is positive, you should quarantine for 5 days from the start of your symptoms

## 2022-07-25 ENCOUNTER — Telehealth (HOSPITAL_COMMUNITY): Payer: Self-pay | Admitting: Emergency Medicine

## 2022-07-25 LAB — SARS CORONAVIRUS 2 (TAT 6-24 HRS): SARS Coronavirus 2: POSITIVE — AB

## 2022-07-25 MED ORDER — NIRMATRELVIR/RITONAVIR (PAXLOVID) TABLET (RENAL DOSING): 2.0000 | ORAL_TABLET | Freq: Two times a day (BID) | ORAL | 0 refills | Status: AC

## 2022-08-06 ENCOUNTER — Ambulatory Visit (INDEPENDENT_AMBULATORY_CARE_PROVIDER_SITE_OTHER): Payer: 59 | Admitting: Podiatry

## 2022-08-06 ENCOUNTER — Encounter: Payer: Self-pay | Admitting: Podiatry

## 2022-08-06 DIAGNOSIS — I6310 Cerebral infarction due to embolism of unspecified precerebral artery: Secondary | ICD-10-CM

## 2022-08-06 DIAGNOSIS — M79674 Pain in right toe(s): Secondary | ICD-10-CM

## 2022-08-06 DIAGNOSIS — M79675 Pain in left toe(s): Secondary | ICD-10-CM

## 2022-08-06 DIAGNOSIS — B351 Tinea unguium: Secondary | ICD-10-CM

## 2022-08-06 DIAGNOSIS — E1142 Type 2 diabetes mellitus with diabetic polyneuropathy: Secondary | ICD-10-CM

## 2022-08-06 NOTE — Progress Notes (Signed)
This patient returns to my office for at risk foot care.  This patient requires this care by a professional since this patient will be at risk due to having diabetes and coagulation defect due to plavix. He presents to the office with male caregiver.  This patient is unable to cut nails himself since the patient cannot reach his nails.These nails are painful walking and wearing shoes.  This patient presents for at risk foot care today.  General Appearance  Alert, conversant and in no acute stress.  Vascular  Dorsalis pedis and posterior tibial  pulses are palpable  left foot.  Dorsalis pedis WNL right.  Absent posterior tibial pulses  B/L.Marland Kitchen  Capillary return is within normal limits  bilaterally. Temperature is within normal limits  bilaterally.  Neurologic  Senn-Weinstein monofilament wire test within normal limits  left foot.  LOPS absent right foot.. Muscle power within normal limits bilaterally.  Nails Thick disfigured discolored nails with subungual debris  from hallux to fifth toes bilaterally. No evidence of bacterial infection or drainage bilaterally.  Orthopedic  No limitations of motion  feet .  No crepitus or effusions noted.  No bony pathology or digital deformities noted.  Skin  normotropic skin with no porokeratosis noted bilaterally.  No signs of infections or ulcers noted.    Onychomycosis  Pain in right toes  Pain in left toes  Diabetes    Consent was obtained for treatment procedures.   Mechanical debridement of nails 1-5  bilaterally performed with a nail nipper.  Filed with dremel without incident.     Return office visit   3 months                   Told patient to return for periodic foot care and evaluation due to potential at risk complications.   Gardiner Barefoot DPM

## 2022-08-09 ENCOUNTER — Ambulatory Visit: Payer: Medicare Other | Admitting: Pharmacist

## 2022-08-16 ENCOUNTER — Ambulatory Visit (HOSPITAL_COMMUNITY)
Admission: EM | Admit: 2022-08-16 | Discharge: 2022-08-16 | Disposition: A | Discharge status: Home / Self Care | Payer: 59 | Attending: Physician Assistant | Admitting: Physician Assistant

## 2022-08-16 ENCOUNTER — Encounter (HOSPITAL_COMMUNITY): Payer: Self-pay

## 2022-08-16 DIAGNOSIS — S91112A Laceration without foreign body of left great toe without damage to nail, initial encounter: Secondary | ICD-10-CM

## 2022-08-16 MED ORDER — AMOXICILLIN-POT CLAVULANATE 875-125 MG PO TABS: 1.0000 | ORAL_TABLET | Freq: Two times a day (BID) | ORAL | 0 refills | Status: DC

## 2022-08-16 MED ORDER — TETANUS-DIPHTH-ACELL PERTUSSIS 5-2.5-18.5 LF-MCG/0.5 IM SUSY
PREFILLED_SYRINGE | INTRAMUSCULAR | Status: AC
  Filled 2022-08-16: qty 0.5

## 2022-08-16 MED ORDER — TETANUS-DIPHTH-ACELL PERTUSSIS 5-2.5-18.5 LF-MCG/0.5 IM SUSY
0.5000 mL | PREFILLED_SYRINGE | Freq: Once | INTRAMUSCULAR | Status: AC
  Administered 2022-08-16: 0.5 mL via INTRAMUSCULAR

## 2022-08-16 NOTE — ED Provider Notes (Signed)
[facility]    CSN: [id number] Arrival date & time: 08/16/22  1327      History   Chief Complaint Chief Complaint  Patient presents with   Laceration    HPI Adam Callahan is a 62 y.o. male.   Patient here today for evaluation of laceration to his left great toe that occurred earlier today when he accidentally stepped on a razor. He reports that razor was not clean. His son cleaned his wound with alcohol thoroughly. He is unsure when his last tetanus vaccination was administered. He has been able to control bleeding. Patient does have diabetes at baseline but denies any numbness in his toe.   The history is provided by the patient.  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[place]) 07/08/2014   Homonymous hemianopsia following cerebrovascular accident 06/06/2014   Acute left hemiparesis ([place]) 05/04/2014   Stroke ([place]) 04/30/2014   Spastic hemiplegia affecting dominant side ([place]) 10/01/2013   Aphasia as late effect of cerebrovascular accident 10/01/2013   Alterations of sensations, late effect of cerebrovascular disease(438.6) 29/52/8413   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 Authorizing Provider  amoxicillin-clavulanate (AUGMENTIN) 875-125 MG tablet Take 1 tablet by mouth every 12 (twelve) hours. 08/16/22  Yes Francene Finders, PA-C  amLODipine (NORVASC) 10 MG tablet Take 1 tablet (10 mg total) by mouth daily. 04/05/22   Kerin Perna, NP  ASPIRIN LOW DOSE 81 MG EC tablet Take 81 mg by mouth daily. 01/15/21   [provider]  atorvastatin (LIPITOR) 40 MG tablet Take 1 tablet (40 mg total) by mouth daily. 06/19/22   Kerin Perna, NP  clopidogrel (PLAVIX) 75 MG tablet Take 75 mg by mouth daily.    [provider]  Continuous Blood Gluc Receiver (FREESTYLE LIBRE 2 READER) DEVI Check blood sugar three times daily. E11.69 04/17/22   Charlott Rakes, MD  Continuous Blood Gluc Sensor (FREESTYLE LIBRE 2 SENSOR) MISC CHECK BLOOD SUGAR 3 TIMES DAILY 06/25/22   Charlott Rakes, MD  HUMALOG KWIKPEN 100 UNIT/ML KwikPen INJECT SUBCUTANEOUSLY 6 UNITS 3  TIMES DAILY 06/12/22   Kerin Perna, NP  hydrALAZINE (APRESOLINE) 25 MG tablet TAKE 1 TABLET(25 MG) BY MOUTH THREE TIMES DAILY 07/29/22   Kerin Perna, NP  insulin glargine (LANTUS SOLOSTAR) 100 UNIT/ML Solostar Pen Inject 36 Units into the skin daily. 12/25/21   Kerin Perna, NP  triamterene-hydrochlorothiazide (MAXZIDE-25) 37.5-25 MG tablet Take 1 tablet by mouth daily. 06/23/22   Kerin Perna, NP  Family History Family History  Problem Relation Age of Onset   Cervical cancer Mother    Hypertension Sister    Diabetes Mellitus II Maternal Uncle    Colon cancer Maternal Aunt     Social History Social History   Tobacco Use   Smoking status: Never   Smokeless tobacco: Never  Vaping Use   Vaping Use: Never used  Substance Use Topics   Alcohol use: No    Alcohol/week: 0.0 standard  drinks of alcohol   Drug use: No     Allergies   Patient has no known allergies.   Review of Systems Review of Systems  Constitutional:  Negative for chills and fever.  Eyes:  Negative for discharge and redness.  Respiratory:  Negative for shortness of breath.   Skin:  Positive for wound. Negative for color change.  Neurological:  Negative for numbness.     Physical Exam Triage Vital Signs ED Triage Vitals  Enc Vitals Group     BP 08/16/22 1529 (!) 173/84     Pulse Rate 08/16/22 1529 96     Resp 08/16/22 1529 16     Temp 08/16/22 1529 98.1 F (36.7 C)     Temp Source 08/16/22 1529 Oral     SpO2 08/16/22 1529 97 %     Weight --      Height --      Head Circumference --      Peak Flow --      Pain Score 08/16/22 1530 0     Pain Loc --      Pain Edu? --      Excl. in [place]? --    No data found.  Updated Vital Signs BP (!) 173/84 (BP Location: Left Arm) Comment: patient has not taken his BP meds today.  Pulse 96   Temp 98.1 F (36.7 C) (Oral)   Resp 16   SpO2 97%       Physical Exam Vitals and nursing note reviewed.  Constitutional:      General: He is not in acute distress.    Appearance: Normal appearance. He is not ill-appearing.  HENT:     Head: Normocephalic and atraumatic.  Eyes:     Conjunctiva/sclera: Conjunctivae normal.  Cardiovascular:     Rate and Rhythm: Normal rate.  Pulmonary:     Effort: Pulmonary effort is normal. No respiratory distress.  Skin:    Capillary Refill: Normal cap refill to distal left great toe.     Comments: C shaped laceration/ skin flap noted to sole of left great toe approx 2 cm in length. No active bleeding.   Neurological:     Mental Status: He is alert.     Comments: Gross sensation intact to left great toe distally  Psychiatric:        Mood and Affect: Mood normal.        Behavior: Behavior normal.      UC Treatments / Results  Labs (all labs ordered are listed, but only abnormal results are  displayed) Labs Reviewed - No data to display  EKG   Radiology No results found.  Procedures Laceration Repair  Date/Time: 08/16/2022 5:08 PM  Performed by: Francene Finders, PA-C Authorized by: Francene Finders, PA-C   Consent:    Consent obtained:  Verbal   Consent given by:  Patient   Risks discussed:  Infection, pain and poor wound healing   Alternatives discussed:  No treatment Universal protocol:  Procedure explained and questions answered to patient or proxy's satisfaction: yes     Required blood products, implants, devices, and special equipment available: yes     Patient identity confirmed:  Provided demographic data Anesthesia:    Anesthesia method:  None Laceration details:    Location:  Toe   Toe location:  L big toe   Length (cm):  2   Depth (mm):  1 Treatment:    Wound cleansed with: alcohol.   Amount of cleaning:  Standard Skin repair:    Repair method:  Tissue adhesive Approximation:    Approximation:  Close Repair type:    Repair type:  Simple Post-procedure details:    Dressing:  Non-adherent dressing   Procedure completion:  Tolerated well, no immediate complications Comments:     Approx 1 mm area of wound left open to allow for drainage should infection occur  (including critical care time)  Medications Ordered in UC Medications  Tdap (BOOSTRIX) injection 0.5 mL (0.5 mLs Intramuscular Given 08/16/22 1613)    Initial Impression / Assessment and Plan / UC Course  I have reviewed the triage vital signs and the nursing notes.  Pertinent labs & imaging results that were available during my care of the patient were reviewed by me and considered in my medical decision making (see chart for details).    Laceration repaired in office with dermabond. Encouraged follow up with any signs of infection- given known diabetes will treat with antibiotic and tetanus vaccination administered in office.   Final Clinical Impressions(s) / UC Diagnoses    Final diagnoses:  Laceration of left great toe without foreign body present or damage to nail, initial encounter   Discharge Instructions   None    ED Prescriptions     Medication Sig Dispense Auth. Provider   amoxicillin-clavulanate (AUGMENTIN) 875-125 MG tablet Take 1 tablet by mouth every 12 (twelve) hours. 14 tablet Francene Finders, PA-C      PDMP not reviewed this encounter.   Francene Finders, PA-C 08/16/22 1710

## 2022-08-16 NOTE — ED Triage Notes (Signed)
Patient reports that he stepped on a razor and cut his left big toe today. Bleeding controlled.

## 2022-08-20 ENCOUNTER — Other Ambulatory Visit (INDEPENDENT_AMBULATORY_CARE_PROVIDER_SITE_OTHER): Payer: Self-pay | Admitting: Primary Care

## 2022-08-20 DIAGNOSIS — I10 Essential (primary) hypertension: Secondary | ICD-10-CM

## 2022-08-20 DIAGNOSIS — Z131 Encounter for screening for diabetes mellitus: Secondary | ICD-10-CM

## 2022-08-20 NOTE — Telephone Encounter (Signed)
Requested medication (s) are due for refill today: yes  Requested medication (s) are on the active medication list: yes  Last refill:  06/19/22 #90   Future visit scheduled: yes  Notes to clinic:  overdue lab work   Requested Prescriptions  Pending Prescriptions Disp Refills   atorvastatin (LIPITOR) 40 MG tablet [Pharmacy Med Name: Atorvastatin Calcium 40 MG Oral Tablet] 90 tablet 3    Sig: TAKE 1 TABLET BY MOUTH DAILY     Cardiovascular:  Antilipid - Statins Failed - 08/20/2022  8:32 AM      Failed - Lipid Panel in normal range within the last 12 months    Cholesterol, Total  Date Value Ref Range Status  07/25/2021 115 100 - 199 mg/dL Final   LDL Chol Calc (NIH)  Date Value Ref Range Status  07/25/2021 61 0 - 99 mg/dL Final   HDL  Date Value Ref Range Status  07/25/2021 42 >39 mg/dL Final   Triglycerides  Date Value Ref Range Status  07/25/2021 54 0 - 149 mg/dL Final         Passed - Patient is not pregnant      Passed - Valid encounter within last 12 months    Recent Outpatient Visits           1 month ago Essential hypertension   [place], Michelle P, NP   2 months ago Type 2 diabetes mellitus without complication, without long-term current use of insulin ([place])   [place], Michelle P, NP   4 months ago Encounter for medication review   [place], Michelle P, NP   5 months ago Colon cancer screening   [place], Michelle P, NP   8 months ago Colon cancer screening   [place], [place], NP       Future Appointments             In 3 weeks Oletta Lamas, [place] Cage, NP [place]

## 2022-08-21 NOTE — Telephone Encounter (Signed)
Requested Prescriptions  Pending Prescriptions Disp Refills   amLODipine (NORVASC) 10 MG tablet [Pharmacy Med Name: amLODIPine Besylate 10 MG Oral Tablet] 90 tablet 1    Sig: TAKE 1 TABLET BY MOUTH DAILY     Cardiovascular: Calcium Channel Blockers 2 Failed - 08/20/2022 10:37 PM      Failed - Last BP in normal range    BP Readings from Last 1 Encounters:  08/16/22 (!) 173/84         Passed - Last Heart Rate in normal range    Pulse Readings from Last 1 Encounters:  08/16/22 96         Passed - Valid encounter within last 6 months    Recent Outpatient Visits           1 month ago Essential hypertension   [place], [place] P, NP   2 months ago Type 2 diabetes mellitus without complication, without long-term current use of insulin ([place])   [place], Michelle P, NP   4 months ago Encounter for medication review   [place], Michelle P, NP   5 months ago Colon cancer screening   [place] [facility] Kerin Perna, NP   8 months ago Colon cancer screening   [place], [place], NP       Future Appointments             In 2 weeks Oletta Lamas, [place] Cage, NP [place]

## 2022-08-22 NOTE — Telephone Encounter (Signed)
Will forward to provider  

## 2022-09-02 ENCOUNTER — Other Ambulatory Visit (INDEPENDENT_AMBULATORY_CARE_PROVIDER_SITE_OTHER): Payer: Self-pay | Admitting: Primary Care

## 2022-09-02 DIAGNOSIS — E119 Type 2 diabetes mellitus without complications: Secondary | ICD-10-CM

## 2022-09-02 DIAGNOSIS — I10 Essential (primary) hypertension: Secondary | ICD-10-CM

## 2022-09-02 DIAGNOSIS — Z760 Encounter for issue of repeat prescription: Secondary | ICD-10-CM

## 2022-09-03 NOTE — Telephone Encounter (Signed)
Requested medications are due for refill today.  yes  Requested medications are on the active medications list.  yes  Last refill. 06/23/2022 #90 1 rf  Future visit scheduled.   yes  Notes to clinic.  Labs are expired.    Requested Prescriptions  Pending Prescriptions Disp Refills   triamterene-hydrochlorothiazide (MAXZIDE-25) 37.5-25 MG tablet [Pharmacy Med Name: Triamterene-HCTZ 37.5-25 MG Oral Tablet] 80 tablet 3    Sig: Take 1 tablet by mouth daily.     Cardiovascular: Diuretic Combos Failed - 09/02/2022 11:13 AM      Failed - K in normal range and within 180 days    Potassium  Date Value Ref Range Status  07/25/2021 4.2 3.5 - 5.2 mmol/L Final         Failed - Na in normal range and within 180 days    Sodium  Date Value Ref Range Status  07/25/2021 138 134 - 144 mmol/L Final         Failed - Cr in normal range and within 180 days    Creatinine, Ser  Date Value Ref Range Status  07/25/2021 1.77 (H) 0.76 - 1.27 mg/dL Final         Failed - Last BP in normal range    BP Readings from Last 1 Encounters:  08/16/22 (!) 173/84         Passed - Valid encounter within last 6 months    Recent Outpatient Visits           1 month ago Essential hypertension   [place], [place] P, NP   3 months ago Type 2 diabetes mellitus without complication, without long-term current use of insulin ([place])   [place] [facility] Kerin Perna, NP   4 months ago Encounter for medication review   [place] [facility] Kerin Perna, NP   6 months ago Colon cancer screening   [place] [facility] Kerin Perna, NP   9 months ago Colon cancer screening   [place] [facility] Kerin Perna, NP       Future Appointments             In 1 week Oletta Lamas, [place] Cage, NP [place]

## 2022-09-10 ENCOUNTER — Ambulatory Visit (INDEPENDENT_AMBULATORY_CARE_PROVIDER_SITE_OTHER): Payer: 59 | Admitting: Primary Care

## 2022-09-10 ENCOUNTER — Encounter (INDEPENDENT_AMBULATORY_CARE_PROVIDER_SITE_OTHER): Payer: Self-pay | Admitting: Primary Care

## 2022-09-10 VITALS — BP 163/84 | HR 87 | Resp 16 | Wt 180.0 lb

## 2022-09-10 DIAGNOSIS — E785 Hyperlipidemia, unspecified: Secondary | ICD-10-CM | POA: Diagnosis not present

## 2022-09-10 DIAGNOSIS — Z760 Encounter for issue of repeat prescription: Secondary | ICD-10-CM

## 2022-09-10 DIAGNOSIS — I10 Essential (primary) hypertension: Secondary | ICD-10-CM | POA: Diagnosis not present

## 2022-09-10 DIAGNOSIS — E876 Hypokalemia: Secondary | ICD-10-CM

## 2022-09-10 DIAGNOSIS — E119 Type 2 diabetes mellitus without complications: Secondary | ICD-10-CM | POA: Diagnosis not present

## 2022-09-10 DIAGNOSIS — Z131 Encounter for screening for diabetes mellitus: Secondary | ICD-10-CM

## 2022-09-10 LAB — POCT GLYCOSYLATED HEMOGLOBIN (HGB A1C): HbA1c, POC (controlled diabetic range): 8.8 % — AB (ref 0.0–7.0)

## 2022-09-10 MED ORDER — HYDRALAZINE HCL 25 MG PO TABS: ORAL_TABLET | ORAL | 1 refills | Status: DC

## 2022-09-10 MED ORDER — INSULIN LISPRO (1 UNIT DIAL) 100 UNIT/ML (KWIKPEN): 10.0000 [IU] | PEN_INJECTOR | Freq: Three times a day (TID) | SUBCUTANEOUS | 3 refills | Status: DC

## 2022-09-10 MED ORDER — AMLODIPINE BESYLATE 10 MG PO TABS: 10.0000 mg | ORAL_TABLET | Freq: Every day | ORAL | 1 refills | Status: DC

## 2022-09-10 MED ORDER — TRIAMTERENE-HCTZ 37.5-25 MG PO TABS: 1.0000 | ORAL_TABLET | Freq: Every day | ORAL | 1 refills | Status: DC

## 2022-09-10 NOTE — Progress Notes (Signed)
Subjective:  [name], male    DOB: Jan 11, 1961  Age: 62 y.o. MRN: [id number]  CC: Hypertension and Diabetes   HPI [name] presents for follow-up of diabetes. Patient does not check blood sugar at home.  Compliant with meds - No Checking CBGs? No  Fasting avg -   Postprandial average -  Exercising regularly? - No Watching carbohydrate intake? - Yes Neuropathy ? 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 He has been out of Bp meds for the last 3 days Maxzide 37.5/25mg but has a refill avaiable  Patient has No headache, No chest pain, No abdominal pain - No Nausea, No new weakness tingling or numbness, No Cough - shortness of breath .  Medications as noted below. Taking them regularly without complication/adverse reaction being reported today.   History Keymari has a past medical history of Diabetes mellitus, Hypertension, and Stroke ([place]).   He has a past surgical history that includes TEE without cardioversion (N/A, 05/03/2014).   His family history includes Cervical cancer in his mother; Colon cancer in his maternal aunt; Diabetes Mellitus II in his maternal uncle; Hypertension in his sister.He reports that he has never smoked. He has never used smokeless tobacco. He reports that he does not drink alcohol and does not use drugs.  Current Outpatient Medications on File Prior to Visit  Medication Sig Dispense Refill   amoxicillin-clavulanate (AUGMENTIN) 875-125 MG tablet Take 1 tablet by mouth every 12 (twelve) hours. 14 tablet 0   ASPIRIN LOW DOSE 81 MG EC tablet Take 81 mg by mouth daily.     atorvastatin (LIPITOR) 40 MG tablet TAKE 1 TABLET BY MOUTH DAILY 90 tablet 3   clopidogrel (PLAVIX) 75 MG tablet Take 75 mg by mouth daily.     Continuous Blood Gluc Receiver (FREESTYLE LIBRE  2 READER) DEVI Check blood sugar three times daily. E11.69 1 each 0   Continuous Blood Gluc Sensor (FREESTYLE LIBRE 2 SENSOR) MISC CHECK BLOOD SUGAR 3 TIMES DAILY 2 each 3   insulin glargine (LANTUS SOLOSTAR) 100 UNIT/ML Solostar Pen Inject 36 Units into the skin daily. 15 mL PRN   No current facility-administered medications on file prior to visit.    ROS Comprehensive ROS Pertinent positive and negative noted in HPI    Objective:  Blood Pressure (Abnormal) 163/84   Pulse 87   Respiration 16   Weight 180 lb (81.6 kg)   Oxygen Saturation 96%   Body Mass Index 29.95 kg/m   BP Readings from Last 3 Encounters:  09/10/22 (Abnormal) 163/84  08/16/22 (Abnormal) 173/84  07/24/22 (Abnormal) 161/82    Wt Readings from Last 3 Encounters:  09/10/22 180 lb (81.6 kg)  07/08/22 184 lb 9.6 oz (83.7 kg)  06/04/22 183 lb 6.4 oz (83.2 kg)    Physical Exam Constitutional:      Appearance: Normal appearance. He is obese.  HENT:     Head: Normocephalic.     Right Ear: Tympanic membrane and external ear normal.     Left Ear: Tympanic membrane and external ear normal.     Nose: Nose normal.  Eyes:     Extraocular Movements: Extraocular movements intact.  Cardiovascular:     Rate and Rhythm: Normal rate and regular rhythm.  Pulmonary:  Effort: Pulmonary effort is normal.     Breath sounds: Normal breath sounds.  Abdominal:     General: Bowel sounds are normal. There is distension.     Palpations: Abdomen is soft.  Musculoskeletal:     Cervical back: Normal range of motion and neck supple.     Comments: Right arm hemiparesis   Skin:    General: Skin is warm and dry.  Neurological:     Mental Status: He is oriented to person, place, and time.  Psychiatric:        Mood and Affect: Mood normal.        Behavior: Behavior normal.    Lab Results  Component Value Date   HGBA1C 8.8 (A) 09/10/2022   HGBA1C 9.9 (A) 06/04/2022   HGBA1C 6.4 (A) 11/29/2021    Lab Results  Component  Value Date   WBC 6.5 04/22/2021   HGB 14.6 04/22/2021   HCT 43.0 04/22/2021   PLT 331 04/22/2021   GLUCOSE 171 (H) 07/25/2021   CHOL 115 07/25/2021   TRIG 54 07/25/2021   HDL 42 07/25/2021   LDLCALC 61 07/25/2021   ALT 17 07/25/2021   AST 18 07/25/2021   NA 138 07/25/2021   K 4.2 07/25/2021   CL 98 07/25/2021   CREATININE 1.77 (H) 07/25/2021   BUN 29 (H) 07/25/2021   CO2 26 07/25/2021   TSH 0.478 06/06/2013   INR 1.04 04/27/2014   HGBA1C 8.8 (A) 09/10/2022     Assessment & Plan:  Denzil was seen today for hypertension and diabetes.  Diagnoses and all orders for this visit:  Type 2 diabetes mellitus without complication, without long-term current use of insulin (HCC) -     POCT glycosylated hemoglobin (Hb A1C) 8.8 is down from 9.9. Unable to make appt scheduled with clinical pharmacist will reschedule. - educated on lifestyle modifications, including but not limited to diet choices and adding exercise to daily routine.   Increased Humalog 10 units TID after meals  Scheduled eye appt not available until June-     CBC with Differential  Hypokalemia -     CMP14+EGFR  Dyslipidemia Elevated cholesterol Increases your  risk of another  heart attack and/or stroke.  To reduce your Cholesterol , Remember - more fruits and vegetables, more fish, and limit red meat and dairy products.  More soy, nuts, beans, barley, lentils, oats and plant sterol ester enriched margarine instead of butter.  I also encourage eliminating sugar and processed food.  New script sent fo XXXXX  -     Lipid Panel  Essential hypertension Did not take any Bp medication prior to appt. BP goal - < 140/90 Explained that having normal blood pressure is the goal and medications are helping to get to goal and maintain normal blood pressure. DIET: Limit salt intake, read nutrition labels to check salt content, limit fried and high fatty foods  Avoid using multisymptom OTC cold preparations that generally contain  sudafed which can rise BP. Consult with pharmacist on best cold relief products to use for persons with HTN EXERCISE Discussed incorporating exercise such as walking - 30 minutes most days of the week and can do in 10 minute intervals    -     CMP14+EGFR  Shingle vaccine - prescription given on previous visit    Follow-up:   Return in about 4 weeks (around 10/08/2022) for Bp ck  schedule AWV.  The above assessment and management plan was discussed with the patient. The patient  verbalized understanding of and has agreed to the management plan. Patient is aware to call the clinic if symptoms fail to improve or worsen. Patient is aware when to return to the clinic for a follow-up visit. Patient educated on when it is appropriate to go to the emergency department.   Juluis Mire, NP-C

## 2022-09-11 ENCOUNTER — Telehealth (INDEPENDENT_AMBULATORY_CARE_PROVIDER_SITE_OTHER): Payer: Self-pay

## 2022-09-11 LAB — CMP14+EGFR
ALT: 37 IU/L (ref 0–44)
AST: 22 IU/L (ref 0–40)
Albumin/Globulin Ratio: 1.3 (ref 1.2–2.2)
Albumin: 4 g/dL (ref 3.8–4.9)
Alkaline Phosphatase: 79 IU/L (ref 44–121)
BUN/Creatinine Ratio: 12 (ref 10–24)
BUN: 13 mg/dL (ref 8–27)
Bilirubin Total: 0.5 mg/dL (ref 0.0–1.2)
CO2: 24 mmol/L (ref 20–29)
Calcium: 9.3 mg/dL (ref 8.6–10.2)
Chloride: 105 mmol/L (ref 96–106)
Creatinine, Ser: 1.05 mg/dL (ref 0.76–1.27)
Globulin, Total: 3.2 g/dL (ref 1.5–4.5)
Glucose: 140 mg/dL — ABNORMAL HIGH (ref 70–99)
Potassium: 4.8 mmol/L (ref 3.5–5.2)
Sodium: 141 mmol/L (ref 134–144)
Total Protein: 7.2 g/dL (ref 6.0–8.5)
eGFR: 81 mL/min/{1.73_m2} (ref >59)

## 2022-09-11 LAB — CBC WITH DIFFERENTIAL/PLATELET
Basophils Absolute: 0 10*3/uL (ref 0.0–0.2)
Basos: 1 %
EOS (ABSOLUTE): 0.2 10*3/uL (ref 0.0–0.4)
Eos: 4 %
Hematocrit: 36.3 % — ABNORMAL LOW (ref 37.5–51.0)
Hemoglobin: 12.2 g/dL — ABNORMAL LOW (ref 13.0–17.7)
Immature Grans (Abs): 0 10*3/uL (ref 0.0–0.1)
Immature Granulocytes: 0 %
Lymphocytes Absolute: 2.1 10*3/uL (ref 0.7–3.1)
Lymphs: 38 %
MCH: 27.7 pg (ref 26.6–33.0)
MCHC: 33.6 g/dL (ref 31.5–35.7)
MCV: 83 fL (ref 79–97)
Monocytes Absolute: 0.6 10*3/uL (ref 0.1–0.9)
Monocytes: 11 %
Neutrophils Absolute: 2.6 10*3/uL (ref 1.4–7.0)
Neutrophils: 46 %
Platelets: 313 10*3/uL (ref 150–450)
RBC: 4.4 x10E6/uL (ref 4.14–5.80)
RDW: 12.7 % (ref 11.6–15.4)
WBC: 5.6 10*3/uL (ref 3.4–10.8)

## 2022-09-11 LAB — LIPID PANEL
Chol/HDL Ratio: 3.2 ratio (ref 0.0–5.0)
Cholesterol, Total: 131 mg/dL (ref 100–199)
HDL: 41 mg/dL (ref >39)
LDL Chol Calc (NIH): 77 mg/dL (ref 0–99)
Triglycerides: 62 mg/dL (ref 0–149)
VLDL Cholesterol Cal: 13 mg/dL (ref 5–40)

## 2022-09-11 NOTE — Telephone Encounter (Signed)
Contacted pt to go over lab results pt is aware and doesn't have any questions or concerns 

## 2022-09-12 LAB — MICROALBUMIN / CREATININE URINE RATIO
Creatinine, Urine: 68.6 mg/dL
Microalb/Creat Ratio: 126 mg/g creat — ABNORMAL HIGH (ref 0–29)
Microalbumin, Urine: 86.5 ug/mL

## 2022-09-13 ENCOUNTER — Ambulatory Visit: Payer: 59 | Admitting: Pharmacist

## 2022-09-17 ENCOUNTER — Other Ambulatory Visit: Payer: Self-pay

## 2022-10-08 ENCOUNTER — Ambulatory Visit (INDEPENDENT_AMBULATORY_CARE_PROVIDER_SITE_OTHER): Payer: Self-pay | Admitting: Primary Care

## 2022-10-09 ENCOUNTER — Ambulatory Visit (INDEPENDENT_AMBULATORY_CARE_PROVIDER_SITE_OTHER): Payer: Medicare HMO | Admitting: Primary Care

## 2022-10-09 ENCOUNTER — Encounter (INDEPENDENT_AMBULATORY_CARE_PROVIDER_SITE_OTHER): Payer: Self-pay | Admitting: Primary Care

## 2022-10-09 VITALS — BP 137/74 | HR 85 | Resp 16 | Wt 180.2 lb

## 2022-10-09 DIAGNOSIS — Z0130 Encounter for examination of blood pressure without abnormal findings: Secondary | ICD-10-CM | POA: Diagnosis not present

## 2022-10-14 NOTE — Progress Notes (Signed)
Nurse visit Bp only

## 2022-10-24 ENCOUNTER — Encounter: Payer: Self-pay | Admitting: Pharmacist

## 2022-10-24 ENCOUNTER — Ambulatory Visit: Payer: 59 | Attending: Primary Care | Admitting: Pharmacist

## 2022-10-24 DIAGNOSIS — Z794 Long term (current) use of insulin: Secondary | ICD-10-CM

## 2022-10-24 DIAGNOSIS — E1169 Type 2 diabetes mellitus with other specified complication: Secondary | ICD-10-CM | POA: Diagnosis not present

## 2022-10-24 NOTE — Progress Notes (Signed)
Patient was educated on the use of the [employer] 2 blood glucose meter. Reviewed necessary supplies and operation of the meter. Also reviewed goal blood glucose levels. Patient was able to demonstrate use. We placed his first sensor today in clinic. All questions and concerns were addressed.  Time spent counseling: 15 minutes  Follow-up: prn  Adam Callahan, PharmD, [place], [place] [phone]

## 2022-11-14 ENCOUNTER — Telehealth (INDEPENDENT_AMBULATORY_CARE_PROVIDER_SITE_OTHER): Payer: Self-pay | Admitting: Primary Care

## 2022-11-14 NOTE — Telephone Encounter (Signed)
Called patient to schedule Medicare Annual Wellness Visit (AWV). Left message for patient to call back and schedule Medicare Annual Wellness Visit (AWV).  Last date of AWV: 10/12/2021    Please schedule an appointment at any time with Abby, NHA.  If any questions, please contact me at [phone].  Thank you,  Judeth [place],  AMB Clinical Support [facility] AWV Program Direct Dial ??[phone]

## 2022-11-15 NOTE — Telephone Encounter (Signed)
Called patient to schedule Medicare Annual Wellness Visit (AWV). Left message for patient to call back and schedule Medicare Annual Wellness Visit (AWV).  Returned patients call  Please schedule an appointment at any time with Abby, NHA. .  If any questions, please contact me at [phone].  Thank you,  Judeth Cornfield,  AMB Clinical Support [facility] AWV Program Direct Dial ??[phone]

## 2022-11-25 ENCOUNTER — Ambulatory Visit: Payer: 59 | Attending: Primary Care | Admitting: Pharmacist

## 2022-11-25 DIAGNOSIS — Z794 Long term (current) use of insulin: Secondary | ICD-10-CM

## 2022-11-25 DIAGNOSIS — E1169 Type 2 diabetes mellitus with other specified complication: Secondary | ICD-10-CM

## 2022-11-25 NOTE — Progress Notes (Signed)
   I connected with  Adam Callahan on 11/25/22 by a video enabled telemedicine application and verified that I am speaking with the correct person using two identifiers.   I discussed the limitations of evaluation and management by telemedicine. The patient expressed understanding and agreed to proceed.  S:     No chief complaint on file.  62 y.o. male who presents for diabetes evaluation, education, and management.  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  Patient was referred and last seen by Primary Care Provider, Gwinda Passe, on 10/09/2022. Clinical pharmacy saw him on 10/24/2022 and helped him place his [place] sensor.    Today, patient is good spirits and is without any assistance. His son came with him to his appointment last month but is not present during today's phone visit. Pt is somewhat difficult to understand is a poor historian.   Family/Social History:  Fhx: HTN, T2DM Tobacco: never smoker Alcohol: none reported  Current diabetes medications include: Lantus 36u daily, Humalog 10u TID with meals.   Patient reports adherence to taking all medications as prescribed.   Insurance coverage: [employer]  Patient reports hypoglycemic events. Gives several readings in the 60s but asymptomatic. Pt is unclear as to the frequency of these occurrences.   Patient denies nocturia (nighttime urination).  Patient reports neuropathy (nerve pain). Patient denies visual changes. Patient reports self foot exams.   Patient reported dietary habits:  -Endorses limited appetite  -Tries to limit sweets and carb-heavy foods   Patient-reported exercise habits: none  Reported home CBG readings:  -Gives wide range of 60s-200s but tells me his "normal" is ~170-180.   O:   Lab Results  Component Value Date   HGBA1C 8.8 (A) 09/10/2022   There were no vitals filed for this  visit.  Lipid Panel     Component Value Date/Time   CHOL 131 09/10/2022 1350   TRIG 62 09/10/2022 1350   HDL 41 09/10/2022 1350   CHOLHDL 3.2 09/10/2022 1350   CHOLHDL 4.8 04/30/2014 0015   VLDL 18 04/30/2014 0015   LDLCALC 77 09/10/2022 1350    Clinical Atherosclerotic Cardiovascular Disease (ASCVD): Yes  The ASCVD Risk score (Arnett DK, et al., 2019) failed to calculate for the following reasons:   The patient has a prior MI or stroke diagnosis   Patient is participating in a Managed Medicaid Plan: no   A/P: Diabetes longstanding currently uncontrolled. Patient is able to verbalize appropriate hypoglycemia management plan. Medication adherence appears appropriate. It's tough to suggest changes today. He will experience hypoglycemia but is asymptomatic. It doesn't appear that this is happening often, but I cannot verify this. Will hold off on changes until he is seen by his PCP in a couple of weeks.  -Continued current insulin regimen.  -Extensively discussed pathophysiology of diabetes, recommended lifestyle interventions, dietary effects on blood sugar control.  -Counseled on s/sx of and management of hypoglycemia.  -Next A1c anticipated with PCP later this month.   Written patient instructions provided. Patient verbalized understanding of treatment plan.  Total time in counseling over the phone: 20 minutes.    Follow-up:  Pharmacist prn. PCP clinic visit 12/09/2022.   Butch Penny, PharmD, P[place]y [place], CPP Clinical Pharmacist [facility] & [facility] [phone]

## 2022-11-28 ENCOUNTER — Other Ambulatory Visit (INDEPENDENT_AMBULATORY_CARE_PROVIDER_SITE_OTHER): Payer: Self-pay | Admitting: Primary Care

## 2022-11-28 DIAGNOSIS — Z131 Encounter for screening for diabetes mellitus: Secondary | ICD-10-CM

## 2022-11-28 NOTE — Telephone Encounter (Signed)
Requested Prescriptions  Pending Prescriptions Disp Refills   LANTUS SOLOSTAR 100 UNIT/ML Solostar Pen [Pharmacy Med Name: Lantus SoloStar 100 UNIT/ML Subcutaneous Solution Pen-injector] 45 mL 2    Sig: INJECT SUBCUTANEOUSLY 36 UNITS  DAILY     Endocrinology:  Diabetes - Insulins Failed - 11/28/2022  8:13 AM      Failed - HBA1C is between 0 and 7.9 and within 180 days    HbA1c, POC (controlled diabetic range)  Date Value Ref Range Status  09/10/2022 8.8 (A) 0.0 - 7.0 % Final         Passed - Valid encounter within last 6 months    Recent Outpatient Visits           3 days ago Type 2 diabetes mellitus with other specified complication, with long-term current use of insulin [facility])   [place] [facility] [place], [place] L, RPH-CPP   1 month ago Type 2 diabetes mellitus with other specified complication, with long-term current use of insulin [facility])   [place] [facility] [place], Cornelius Moras, RPH-CPP   1 month ago BP check   [place] [facility] Grayce Sessions, NP   2 months ago Type 2 diabetes mellitus without complication, without long-term current use of insulin (HCC)   [place] [facility] Grayce Sessions, NP   4 months ago Essential hypertension   [place] [facility] Grayce Sessions, NP       Future Appointments             In 1 week Randa Evens, Kinnie Scales, NP [place] [facility]

## 2022-12-05 ENCOUNTER — Encounter (INDEPENDENT_AMBULATORY_CARE_PROVIDER_SITE_OTHER): Payer: Self-pay

## 2022-12-09 ENCOUNTER — Ambulatory Visit (INDEPENDENT_AMBULATORY_CARE_PROVIDER_SITE_OTHER): Payer: 59 | Admitting: Primary Care

## 2022-12-13 ENCOUNTER — Ambulatory Visit (INDEPENDENT_AMBULATORY_CARE_PROVIDER_SITE_OTHER): Payer: 59 | Admitting: Primary Care

## 2022-12-13 ENCOUNTER — Encounter (INDEPENDENT_AMBULATORY_CARE_PROVIDER_SITE_OTHER): Payer: Self-pay

## 2022-12-31 ENCOUNTER — Other Ambulatory Visit: Payer: Self-pay | Admitting: Family Medicine

## 2022-12-31 DIAGNOSIS — Z794 Long term (current) use of insulin: Secondary | ICD-10-CM

## 2023-01-01 ENCOUNTER — Ambulatory Visit (INDEPENDENT_AMBULATORY_CARE_PROVIDER_SITE_OTHER): Payer: 59 | Admitting: Primary Care

## 2023-01-07 ENCOUNTER — Encounter (INDEPENDENT_AMBULATORY_CARE_PROVIDER_SITE_OTHER): Payer: Self-pay | Admitting: Primary Care

## 2023-01-07 ENCOUNTER — Ambulatory Visit (INDEPENDENT_AMBULATORY_CARE_PROVIDER_SITE_OTHER): Payer: 59 | Admitting: Primary Care

## 2023-01-07 VITALS — BP 153/82 | HR 98 | Resp 16 | Wt 182.6 lb

## 2023-01-07 DIAGNOSIS — Z794 Long term (current) use of insulin: Secondary | ICD-10-CM | POA: Diagnosis not present

## 2023-01-07 DIAGNOSIS — I10 Essential (primary) hypertension: Secondary | ICD-10-CM

## 2023-01-07 DIAGNOSIS — Z131 Encounter for screening for diabetes mellitus: Secondary | ICD-10-CM | POA: Diagnosis not present

## 2023-01-07 DIAGNOSIS — E119 Type 2 diabetes mellitus without complications: Secondary | ICD-10-CM

## 2023-01-07 DIAGNOSIS — Z760 Encounter for issue of repeat prescription: Secondary | ICD-10-CM | POA: Diagnosis not present

## 2023-01-07 LAB — POCT GLYCOSYLATED HEMOGLOBIN (HGB A1C): HbA1c, POC (controlled diabetic range): 9.5 % — AB (ref 0.0–7.0)

## 2023-01-07 MED ORDER — INSULIN LISPRO (1 UNIT DIAL) 100 UNIT/ML (KWIKPEN): 14.0000 [IU] | PEN_INJECTOR | Freq: Three times a day (TID) | SUBCUTANEOUS | 3 refills | Status: DC

## 2023-01-07 MED ORDER — LANTUS SOLOSTAR 100 UNIT/ML ~~LOC~~ SOPN: 40.0000 [IU] | PEN_INJECTOR | Freq: Every day | SUBCUTANEOUS | 2 refills | Status: DC

## 2023-01-07 NOTE — Patient Instructions (Signed)
increase mealtime to 14u TID and increase Lantus to 40u daily.

## 2023-01-08 ENCOUNTER — Telehealth (INDEPENDENT_AMBULATORY_CARE_PROVIDER_SITE_OTHER): Payer: Self-pay

## 2023-01-08 NOTE — Telephone Encounter (Signed)
Copied from CRM [id number]. Topic: General - Other >> Jan 08, 2023 11:20 AM Macon Large wrote: Reason for CRM: Pt requesting Rx for blood pressure monitor

## 2023-01-10 ENCOUNTER — Other Ambulatory Visit (INDEPENDENT_AMBULATORY_CARE_PROVIDER_SITE_OTHER): Payer: Self-pay | Admitting: Primary Care

## 2023-01-10 DIAGNOSIS — I10 Essential (primary) hypertension: Secondary | ICD-10-CM

## 2023-01-10 MED ORDER — BLOOD PRESSURE MONITOR MISC: 1.0000 | Freq: Three times a day (TID) | 0 refills | Status: DC | PRN

## 2023-01-12 ENCOUNTER — Other Ambulatory Visit (INDEPENDENT_AMBULATORY_CARE_PROVIDER_SITE_OTHER): Payer: Self-pay | Admitting: Primary Care

## 2023-01-12 DIAGNOSIS — Z131 Encounter for screening for diabetes mellitus: Secondary | ICD-10-CM

## 2023-01-12 DIAGNOSIS — Z760 Encounter for issue of repeat prescription: Secondary | ICD-10-CM

## 2023-01-12 DIAGNOSIS — I10 Essential (primary) hypertension: Secondary | ICD-10-CM

## 2023-01-12 NOTE — Progress Notes (Signed)
[facility]  Adam Callahan, is a 62 y.o. male  UEA:[id number]  YNW:[id number]  DOB - 1961-02-03  Chief Complaint  Patient presents with   Hypertension   Diabetes       Subjective:   Adam Callahan is a 62 y.o. male here today for a follow up visit-hypertension.  Patient has No headache, No chest pain, No abdominal pain - No Nausea, No new weakness tingling or numbness, No Cough - shortness of breath.  Type 2 diabetes-Denies polyuria, polydipsia, polyphasia or vision changes.  Does not check blood sugars at home.  His son prepares his medications and foods uncertain if he is missing medications. No problems updated.  No Known Allergies  Past Medical History:  Diagnosis Date   Diabetes mellitus    Hypertension    Stroke [facility])     Current Outpatient Medications on File Prior to Visit  Medication Sig Dispense Refill   amLODipine (NORVASC) 10 MG tablet Take 1 tablet (10 mg total) by mouth daily. 90 tablet 1   amoxicillin-clavulanate (AUGMENTIN) 875-125 MG tablet Take 1 tablet by mouth every 12 (twelve) hours. 14 tablet 0   ASPIRIN LOW DOSE 81 MG EC tablet Take 81 mg by mouth daily.     atorvastatin (LIPITOR) 40 MG tablet TAKE 1 TABLET BY MOUTH DAILY 90 tablet 3   clopidogrel (PLAVIX) 75 MG tablet Take 75 mg by mouth daily.     Continuous Glucose Receiver (FREESTYLE LIBRE 2 READER) DEVI CHECK BLOOD SUGAR 3 TIMES DAILY 1 each 0   Continuous Glucose Sensor (FREESTYLE LIBRE 2 SENSOR) MISC CHECK BLOOD SUGAR 3 TIMES DAILY 8 each 2   hydrALAZINE (APRESOLINE) 25 MG tablet TAKE 1 TABLET(25 MG) BY MOUTH THREE TIMES DAILY 270 tablet 1   triamterene-hydrochlorothiazide (MAXZIDE-25) 37.5-25 MG tablet Take 1 tablet by mouth daily. 90 tablet 1   No current facility-administered medications on file prior to visit.    Objective:   Vitals:   01/07/23 1540  BP: (Abnormal) 153/82  Pulse: 98  Resp: 16  SpO2: 99%  Weight: 182 lb 9.6 oz (82.8 kg)    Comprehensive ROS  Pertinent positive and negative noted in HPI   Exam General appearance : Awake, alert, not in any distress. Speech Clear. Not toxic looking HEENT: Atraumatic and Normocephalic, pupils equally reactive to light and accomodation Neck: Supple, no JVD. No cervical lymphadenopathy.  Chest: Good air entry bilaterally, no added sounds  CVS: S1 S2 regular, no murmurs.  Abdomen: Bowel sounds present, Non tender and not distended with no gaurding, rigidity or rebound. Extremities: History of CVA right upper extremity weakness unstable gait uses a cane Neurology: Awake alert, and oriented X 3, CN II-XII intact, Non focal Skin: No Rash  Data Review Lab Results  Component Value Date   HGBA1C 9.5 (A) 01/07/2023   HGBA1C 8.8 (A) 09/10/2022   HGBA1C 9.9 (A) 06/04/2022    Assessment & Plan  Adam Callahan was seen today for hypertension and diabetes.  Diagnoses and all orders for this visit:  Type 2 diabetes mellitus without complication, without long-term current use of insulin (HCC) -     POCT glycosylated hemoglobin (Hb A1C) from 8.8-9.5 son is furious but unclear why A1c is increasing consider decreasing with him monitoring his intake and preparing foods. - educated on lifestyle modifications, including but not limited to diet choices and adding exercise to daily routine.  Adjusted diabetic medication printed on AVS so son can review  Essential hypertension Blood pressure systolic is not at  goal  130-140.  DIET: Limit salt intake, read nutrition labels to check salt content, limit fried and high fatty foods  Avoid using multisymptom OTC cold preparations that generally contain sudafed which can rise BP. Consult with pharmacist on best cold relief products to use for persons with HTN EXERCISE Discussed incorporating exercise such as walking - 30 minutes most days of the week and can do in 10 minute intervals     Medication refill -     insulin lispro (HUMALOG KWIKPEN) 100 UNIT/ML KwikPen; Inject 14  Units into the skin 3 (three) times daily. -     insulin glargine (LANTUS SOLOSTAR) 100 UNIT/ML Solostar Pen; Inject 40 Units into the skin daily. Patient have been counseled extensively about nutrition and exercise. Other issues discussed during this visit include: low cholesterol diet, weight control and daily exercise, foot care, annual eye examinations at Ophthalmology, importance of adherence with medications and regular follow-up. We also discussed long term complications of uncontrolled diabetes and hypertension.   Return in about 3 months (around 04/09/2023) for Diabetes and hypertension.  The patient was given clear instructions to go to ER or return to medical center if symptoms don't improve, worsen or new problems develop. The patient verbalized understanding. The patient was told to call to get lab results if they haven't heard anything in the next week.   This note has been created with Education officer, environmental. Any transcriptional errors are unintentional.   Grayce Sessions, NP 01/12/2023, 10:18 PM

## 2023-01-13 NOTE — Telephone Encounter (Signed)
Requested Prescriptions  Pending Prescriptions Disp Refills   amLODipine (NORVASC) 10 MG tablet [Pharmacy Med Name: amLODIPine Besylate 10 MG Oral Tablet] 80 tablet 3    Sig: TAKE 1 TABLET BY MOUTH DAILY     Cardiovascular: Calcium Channel Blockers 2 Failed - 01/12/2023  5:29 AM      Failed - Last BP in normal range    BP Readings from Last 1 Encounters:  01/07/23 (!) 153/82         Passed - Last Heart Rate in normal range    Pulse Readings from Last 1 Encounters:  01/07/23 98         Passed - Valid encounter within last 6 months    Recent Outpatient Visits           6 days ago Type 2 diabetes mellitus without complication, without long-term current use of insulin (HCC)   [place] [facility] Grayce Sessions, NP   1 month ago Type 2 diabetes mellitus with other specified complication, with long-term current use of insulin [facility])   [place] [facility] [place], [place] L, RPH-CPP   2 months ago Type 2 diabetes mellitus with other specified complication, with long-term current use of insulin [facility])   [place] [facility] [place], [place] L, RPH-CPP   3 months ago BP check   [place] [facility] Grayce Sessions, NP   4 months ago Type 2 diabetes mellitus without complication, without long-term current use of insulin [facility])   [place] [facility] Grayce Sessions, NP       Future Appointments             In 1 month Randa Evens, Kinnie Scales, NP [place] [facility]

## 2023-02-25 ENCOUNTER — Encounter (INDEPENDENT_AMBULATORY_CARE_PROVIDER_SITE_OTHER): Payer: Self-pay

## 2023-03-10 ENCOUNTER — Ambulatory Visit (INDEPENDENT_AMBULATORY_CARE_PROVIDER_SITE_OTHER): Payer: 59 | Admitting: Primary Care

## 2023-03-10 ENCOUNTER — Encounter (INDEPENDENT_AMBULATORY_CARE_PROVIDER_SITE_OTHER): Payer: Self-pay | Admitting: Primary Care

## 2023-03-10 VITALS — BP 151/88 | HR 96 | Ht 66.0 in | Wt 176.4 lb

## 2023-03-10 DIAGNOSIS — R252 Cramp and spasm: Secondary | ICD-10-CM | POA: Diagnosis not present

## 2023-03-10 DIAGNOSIS — I10 Essential (primary) hypertension: Secondary | ICD-10-CM

## 2023-03-10 DIAGNOSIS — Z131 Encounter for screening for diabetes mellitus: Secondary | ICD-10-CM | POA: Diagnosis not present

## 2023-03-10 DIAGNOSIS — I63339 Cerebral infarction due to thrombosis of unspecified posterior cerebral artery: Secondary | ICD-10-CM

## 2023-03-10 DIAGNOSIS — E119 Type 2 diabetes mellitus without complications: Secondary | ICD-10-CM | POA: Diagnosis not present

## 2023-03-10 DIAGNOSIS — Z760 Encounter for issue of repeat prescription: Secondary | ICD-10-CM

## 2023-03-10 DIAGNOSIS — Z794 Long term (current) use of insulin: Secondary | ICD-10-CM

## 2023-03-10 MED ORDER — LANTUS SOLOSTAR 100 UNIT/ML ~~LOC~~ SOPN: 40.0000 [IU] | PEN_INJECTOR | Freq: Every day | SUBCUTANEOUS | 2 refills | Status: DC

## 2023-03-10 MED ORDER — TRIAMTERENE-HCTZ 37.5-25 MG PO TABS: 1.0000 | ORAL_TABLET | Freq: Every day | ORAL | 1 refills | Status: DC

## 2023-03-10 MED ORDER — INSULIN LISPRO (1 UNIT DIAL) 100 UNIT/ML (KWIKPEN): 14.0000 [IU] | PEN_INJECTOR | Freq: Three times a day (TID) | SUBCUTANEOUS | 3 refills | Status: DC

## 2023-03-10 NOTE — Progress Notes (Signed)
[facility]  Adam Callahan, is a 62 y.o. male  WGN:[id number]  HQI:[id number]  DOB - 09-Apr-1961  Chief Complaint  Patient presents with   Follow-up    Right leg cramps       Subjective:   Adam Callahan is a 62 y.o. male here today for a acute visit. He c/o right leg cramps - intermittent thinks it may come from having a pet and having to walk him several times a days. He has lost 6 lbs since last visit. Bp is elevated but just took his meds just before he came in. Patient has No headache, No chest pain, No abdominal pain - No Nausea, No new weakness tingling or numbness, No Cough - shortness of breath  No problems updated.  No Known Allergies  Past Medical History:  Diagnosis Date   Diabetes mellitus    Hypertension    Stroke [facility])     Current Outpatient Medications on File Prior to Visit  Medication Sig Dispense Refill   ASPIRIN LOW DOSE 81 MG EC tablet Take 81 mg by mouth daily.     amLODipine (NORVASC) 10 MG tablet TAKE 1 TABLET BY MOUTH DAILY 90 tablet 0   amoxicillin-clavulanate (AUGMENTIN) 875-125 MG tablet Take 1 tablet by mouth every 12 (twelve) hours. (Patient not taking: Reported on 03/10/2023) 14 tablet 0   atorvastatin (LIPITOR) 40 MG tablet TAKE 1 TABLET BY MOUTH DAILY (Patient not taking: Reported on 03/10/2023) 90 tablet 3   Blood Pressure Monitor MISC 1 kit by Does not apply route 3 (three) times daily as needed. (Patient not taking: Reported on 03/10/2023) 1 each 0   clopidogrel (PLAVIX) 75 MG tablet Take 75 mg by mouth daily. (Patient not taking: Reported on 03/10/2023)     Continuous Glucose Receiver (FREESTYLE LIBRE 2 READER) DEVI CHECK BLOOD SUGAR 3 TIMES DAILY (Patient not taking: Reported on 03/10/2023) 1 each 0   Continuous Glucose Sensor (FREESTYLE LIBRE 2 SENSOR) MISC CHECK BLOOD SUGAR 3 TIMES DAILY (Patient not taking: Reported on 03/10/2023) 8 each 2   hydrALAZINE (APRESOLINE) 25 MG tablet TAKE 1 TABLET(25 MG) BY MOUTH THREE TIMES  DAILY (Patient not taking: Reported on 03/10/2023) 270 tablet 1   insulin glargine (LANTUS SOLOSTAR) 100 UNIT/ML Solostar Pen Inject 40 Units into the skin daily. (Patient not taking: Reported on 03/10/2023) 45 mL 2   insulin lispro (HUMALOG KWIKPEN) 100 UNIT/ML KwikPen Inject 14 Units into the skin 3 (three) times daily. (Patient not taking: Reported on 03/10/2023) 15 mL 3   triamterene-hydrochlorothiazide (MAXZIDE-25) 37.5-25 MG tablet Take 1 tablet by mouth daily. (Patient not taking: Reported on 03/10/2023) 90 tablet 1   No current facility-administered medications on file prior to visit.    Objective:   Vitals:   03/10/23 1356 03/10/23 1358  BP: (Abnormal) 160/81 (Abnormal) 151/88  Pulse: 94 96  SpO2: 99%   Weight: 176 lb 6.4 oz (80 kg)   Height: 5\' 6"  (1.676 m)     Comprehensive ROS Pertinent positive and negative noted in HPI   Exam General appearance : Awake, alert, not in any distress. Speech Clear. Not toxic looking HEENT: Atraumatic and Normocephalic, pupils equally reactive to light and accomodation Neck: Supple, no JVD. No cervical lymphadenopathy.  Chest: Good air entry bilaterally, no added sounds  CVS: S1 S2 regular, no murmurs.  Abdomen: Bowel sounds present, Non tender and not distended with no gaurding, rigidity or rebound. 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 Unstable gait gait uses  quad cane. Neurology: Awake alert, and oriented X 3, Non focal Skin: No Rash  Data Review Lab Results  Component Value Date   HGBA1C 9.5 (A) 01/07/2023   HGBA1C 8.8 (A) 09/10/2022   HGBA1C 9.9 (A) 06/04/2022    Assessment & Plan  Adam Callahan was seen today for follow-up.  Diagnoses and all orders for this visit:  Essential hypertension Elevated secondary just took Bp meds We have discussed target BP range and blood pressure goal.  We discussed the importance of compliance with medical therapy and DASH diet recommended, consequences of  uncontrolled hypertension discussed.  - continue current BP medications  cmp  triamterene-hydrochlorothiazide (MAXZIDE-25) 37.5-25 MG tablet; Take 1 tablet by mouth daily.  Leg cramps Secondary to increased exercising when he rest the cramps subsides.   Medication refill -     insulin lispro (HUMALOG KWIKPEN) 100 UNIT/ML KwikPen; Inject 14 Units into the skin 3 (three) times daily. -     triamterene-hydrochlorothiazide (MAXZIDE-25) 37.5-25 MG tablet; Take 1 tablet by mouth daily.  Type 2 diabetes mellitus without complication, without long-term current use of insulin (HCC) -     -     insulin glargine (LANTUS SOLOSTAR) 100 UNIT/ML Solostar Pen; Inject 40 Units into the skin daily. 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 Other issues discussed during this visit include: low cholesterol diet, weight control and daily exercise, foot care, annual eye examinations at Ophthalmology, importance of adherence with medications and regular follow-up. We also discussed long term complications of uncontrolled diabetes and hypertension.   Return in about 2 months (around 05/10/2023) for DM/fasting.  The patient was given clear instructions to go to ER or return to medical center if symptoms don't improve, worsen or new problems develop. The patient verbalized understanding. The patient was told to call to get lab results if they haven't heard anything in the next week.   This note has been created with Education officer, environmental. Any transcriptional errors are unintentional.   Grayce Sessions, NP 03/10/2023, 2:17 PM

## 2023-03-11 LAB — CMP14+EGFR
ALT: 20 IU/L (ref 0–44)
AST: 21 IU/L (ref 0–40)
Albumin: 4.5 g/dL (ref 3.8–4.9)
Alkaline Phosphatase: 86 IU/L (ref 44–121)
BUN/Creatinine Ratio: 19 (ref 10–24)
BUN: 21 mg/dL (ref 8–27)
Bilirubin Total: 0.4 mg/dL (ref 0.0–1.2)
CO2: 24 mmol/L (ref 20–29)
Calcium: 9.3 mg/dL (ref 8.6–10.2)
Chloride: 100 mmol/L (ref 96–106)
Creatinine, Ser: 1.09 mg/dL (ref 0.76–1.27)
Globulin, Total: 3.4 g/dL (ref 1.5–4.5)
Glucose: 184 mg/dL — ABNORMAL HIGH (ref 70–99)
Potassium: 4.4 mmol/L (ref 3.5–5.2)
Sodium: 140 mmol/L (ref 134–144)
Total Protein: 7.9 g/dL (ref 6.0–8.5)
eGFR: 78 mL/min/{1.73_m2} (ref >59)

## 2023-03-12 ENCOUNTER — Telehealth (INDEPENDENT_AMBULATORY_CARE_PROVIDER_SITE_OTHER): Payer: Self-pay

## 2023-03-12 NOTE — Telephone Encounter (Signed)
Contacted pt to go over lab results pt is aware and doesn't have any questions or concerns 

## 2023-03-14 ENCOUNTER — Other Ambulatory Visit (INDEPENDENT_AMBULATORY_CARE_PROVIDER_SITE_OTHER): Payer: Self-pay | Admitting: Primary Care

## 2023-03-14 DIAGNOSIS — Z760 Encounter for issue of repeat prescription: Secondary | ICD-10-CM

## 2023-04-08 ENCOUNTER — Ambulatory Visit: Payer: 59 | Attending: Primary Care | Admitting: Physical Therapy

## 2023-05-06 ENCOUNTER — Ambulatory Visit: Payer: 59

## 2023-05-12 ENCOUNTER — Encounter (INDEPENDENT_AMBULATORY_CARE_PROVIDER_SITE_OTHER): Payer: Self-pay | Admitting: Primary Care

## 2023-05-12 ENCOUNTER — Ambulatory Visit (INDEPENDENT_AMBULATORY_CARE_PROVIDER_SITE_OTHER): Payer: 59 | Admitting: Primary Care

## 2023-05-12 VITALS — BP 175/80 | HR 80 | Resp 16 | Wt 172.6 lb

## 2023-05-12 DIAGNOSIS — Z131 Encounter for screening for diabetes mellitus: Secondary | ICD-10-CM

## 2023-05-12 DIAGNOSIS — Z760 Encounter for issue of repeat prescription: Secondary | ICD-10-CM | POA: Diagnosis not present

## 2023-05-12 DIAGNOSIS — Z794 Long term (current) use of insulin: Secondary | ICD-10-CM

## 2023-05-12 DIAGNOSIS — E119 Type 2 diabetes mellitus without complications: Secondary | ICD-10-CM | POA: Diagnosis not present

## 2023-05-12 DIAGNOSIS — I10 Essential (primary) hypertension: Secondary | ICD-10-CM | POA: Diagnosis not present

## 2023-05-12 LAB — POCT GLYCOSYLATED HEMOGLOBIN (HGB A1C): HbA1c, POC (controlled diabetic range): 7.9 % — AB (ref 0.0–7.0)

## 2023-05-12 MED ORDER — AMLODIPINE BESYLATE 10 MG PO TABS: 10.0000 mg | ORAL_TABLET | Freq: Every day | ORAL | 1 refills | Status: DC

## 2023-05-12 NOTE — Progress Notes (Unsigned)
[facility]  Adam Callahan, is a 62 y.o. male  UJW:[id number]  FAO:[id number]  DOB - Jun 14, 1961  Chief Complaint  Patient presents with   Diabetes   Hypertension       Subjective:   Adam Callahan is a 62 y.o. male here today for a follow up visit management of T2D and HTN. Denies polyuria, polydipsia, polyphasia or vision changes.  Does not check blood sugars at home.( Due to inability) . Patient has No headache, No chest pain, No abdominal pain - No Nausea, No new weakness tingling or numbness, No Cough - shortness of breath . Patient out of Bp meds actually after review only norvasc was without refills. The rest of his medicines have refills needs someone to pick them up. No problems updated.  No Known Allergies  Past Medical History:  Diagnosis Date   Diabetes mellitus    Hypertension    Stroke [facility])     Current Outpatient Medications on File Prior to Visit  Medication Sig Dispense Refill   amLODipine (NORVASC) 10 MG tablet TAKE 1 TABLET BY MOUTH DAILY 90 tablet 0   ASPIRIN LOW DOSE 81 MG EC tablet Take 81 mg by mouth daily.     insulin glargine (LANTUS SOLOSTAR) 100 UNIT/ML Solostar Pen Inject 40 Units into the skin daily. 45 mL 2   insulin lispro (HUMALOG KWIKPEN) 100 UNIT/ML KwikPen Inject 14 Units into the skin 3 (three) times daily. 15 mL 3   triamterene-hydrochlorothiazide (MAXZIDE-25) 37.5-25 MG tablet Take 1 tablet by mouth daily. 90 tablet 1   No current facility-administered medications on file prior to visit.    Objective:   Health Maintenance  Topic Date Due   OPHTHALMOLOGY EXAM  Never done   Zoster Vaccines- Shingrix (1 of 2) Never done   COVID-19 Vaccine (3 - Pfizer risk series) 01/03/2020   Medicare Annual Wellness (AWV)  10/13/2022   INFLUENZA VACCINE  10/20/2023 (Originally 02/20/2023)   FOOT EXAM  07/09/2023   HEMOGLOBIN A1C  08/12/2023   Diabetic kidney evaluation - Urine ACR  09/11/2023   Diabetic kidney evaluation - eGFR  measurement  03/09/2024   Fecal DNA (Cologuard)  05/15/2025   Hepatitis C Screening  Completed   HIV Screening  Completed   HPV VACCINES  Aged Out   DTaP/Tdap/Td  Discontinued   Colonoscopy  Discontinued      Comprehensive ROS Pertinent positive and negative noted in HPI   Exam General appearance : Awake, alert, not in any distress. Speech Clear. Not toxic looking HEENT: Atraumatic and Normocephalic, pupils equally reactive to light and accomodation Neck: Supple, no JVD. No cervical lymphadenopathy.  Chest: Good air entry bilaterally, no added sounds  CVS: S1 S2 regular, no murmurs.  Abdomen: Bowel sounds present, Non tender and not distended with no gaurding, rigidity or rebound. Extremities: Left side weakness uses quad cane B/L Lower Ext shows no edema, both legs are warm to touch Neurology: Awake alert, and oriented X 3,, Non focal Skin: No Rash  Data Review Lab Results  Component Value Date   HGBA1C 7.9 (A) 05/12/2023   HGBA1C 9.5 (A) 01/07/2023   HGBA1C 8.8 (A) 09/10/2022    Assessment & Plan  Adam Callahan was seen today for diabetes and hypertension.  Diagnoses and all orders for this visit:  Type 2 diabetes mellitus without complication, without long-term current use of insulin (HCC) -     POCT glycosylated hemoglobin (Hb A1C)7.9 improving  - educated on lifestyle modifications, including but not limited to  diet choices and adding exercise to daily routine.     Essential hypertension  Out of Bp meds 3 weeks 173/80 BP goal - < 140/90 Explained that having normal blood pressure is the goal and medications are helping to get to goal and maintain normal blood pressure. DIET: Limit salt intake, read nutrition labels to check salt content, limit fried and high fatty foods  Avoid using multisymptom OTC cold preparations that generally contain sudafed which can rise BP. Consult with pharmacist on best cold relief products to use for persons with HTN EXERCISE Discussed  incorporating exercise such as walking - 30 minutes most days of the week and can do in 10 minute intervals     Patient have been counseled extensively about nutrition and exercise. Other issues discussed during this visit include: low cholesterol diet, weight control and daily exercise, foot care, annual eye examinations at Ophthalmology, importance of adherence with medications and regular follow-up. We also discussed long term complications of uncontrolled diabetes and hypertension.    The patient was given clear instructions to go to ER or return to medical center if symptoms don't improve, worsen or new problems develop. The patient verbalized understanding. The patient was told to call to get lab results if they haven't heard anything in the next week.   This note has been created with Education officer, environmental. Any transcriptional errors are unintentional.   Grayce Sessions, NP 05/12/2023, 2:35 PM

## 2023-05-13 ENCOUNTER — Other Ambulatory Visit (INDEPENDENT_AMBULATORY_CARE_PROVIDER_SITE_OTHER): Payer: Self-pay | Admitting: Primary Care

## 2023-05-13 DIAGNOSIS — Z131 Encounter for screening for diabetes mellitus: Secondary | ICD-10-CM

## 2023-05-13 DIAGNOSIS — Z760 Encounter for issue of repeat prescription: Secondary | ICD-10-CM

## 2023-05-13 DIAGNOSIS — E119 Type 2 diabetes mellitus without complications: Secondary | ICD-10-CM

## 2023-05-13 DIAGNOSIS — I10 Essential (primary) hypertension: Secondary | ICD-10-CM

## 2023-05-13 MED ORDER — TRIAMTERENE-HCTZ 37.5-25 MG PO TABS: 1.0000 | ORAL_TABLET | Freq: Every day | ORAL | 1 refills | Status: DC

## 2023-05-13 MED ORDER — INSULIN LISPRO (1 UNIT DIAL) 100 UNIT/ML (KWIKPEN): 14.0000 [IU] | PEN_INJECTOR | Freq: Three times a day (TID) | SUBCUTANEOUS | 3 refills | Status: DC

## 2023-05-13 MED ORDER — AMLODIPINE BESYLATE 10 MG PO TABS: 10.0000 mg | ORAL_TABLET | Freq: Every day | ORAL | 1 refills | Status: DC

## 2023-05-13 MED ORDER — LANTUS SOLOSTAR 100 UNIT/ML ~~LOC~~ SOPN: 40.0000 [IU] | PEN_INJECTOR | Freq: Every day | SUBCUTANEOUS | 2 refills | Status: DC

## 2023-05-13 NOTE — Telephone Encounter (Signed)
Requested Prescriptions  Pending Prescriptions Disp Refills   ASPIRIN LOW DOSE 81 MG tablet 30 tablet     Sig: Take 1 tablet (81 mg total) by mouth daily.     Analgesics:  NSAIDS - aspirin Passed - 05/13/2023  1:08 PM      Passed - Cr in normal range and within 360 days    Creatinine, Ser  Date Value Ref Range Status  03/10/2023 1.09 0.76 - 1.27 mg/dL Final         Passed - eGFR is 10 or above and within 360 days    GFR calc Af Amer  Date Value Ref Range Status  11/17/2015 >60 >60 mL/min Final    Comment:    (NOTE) The eGFR has been calculated using the CKD EPI equation. This calculation has not been validated in all clinical situations. eGFR's persistently <60 mL/min signify possible Chronic Kidney Disease.    GFR, Estimated  Date Value Ref Range Status  04/22/2021 >60 >60 mL/min Final    Comment:    (NOTE) Calculated using the CKD-EPI Creatinine Equation (2021)    eGFR  Date Value Ref Range Status  03/10/2023 78 >59 mL/min/1.73 Final         Passed - Patient is not pregnant      Passed - Valid encounter within last 12 months    Recent Outpatient Visits           Yesterday Type 2 diabetes mellitus without complication, without long-term current use of insulin (HCC)   [place] [facility] Grayce Sessions, NP   2 months ago Essential hypertension   [place] [facility] Grayce Sessions, NP   4 months ago Type 2 diabetes mellitus without complication, without long-term current use of insulin (HCC)   [place] [facility] Grayce Sessions, NP   5 months ago Type 2 diabetes mellitus with other specified complication, with long-term current use of insulin [facility])   [place] [facility] [place], Simpson L, RPH-CPP   6 months ago Type 2 diabetes mellitus with other specified complication, with long-term current use of insulin [facility])   [place] [facility] [place], Cornelius Moras, RPH-CPP       Future Appointments             In 3 months Randa Evens, Kinnie Scales, NP [place] [facility]             insulin glargine (LANTUS SOLOSTAR) 100 UNIT/ML Solostar Pen 45 mL 2    Sig: Inject 40 Units into the skin daily.     Endocrinology:  Diabetes - Insulins Passed - 05/13/2023  1:08 PM      Passed - HBA1C is between 0 and 7.9 and within 180 days    HbA1c, POC (controlled diabetic range)  Date Value Ref Range Status  05/12/2023 7.9 (A) 0.0 - 7.0 % Final         Passed - Valid encounter within last 6 months    Recent Outpatient Visits           Yesterday Type 2 diabetes mellitus without complication, without long-term current use of insulin (HCC)   [place] [facility] Grayce Sessions, NP   2 months ago Essential hypertension   [place] [facility] Grayce Sessions, NP   4 months ago Type 2 diabetes mellitus without complication, without long-term current use of insulin (  HCC)   [place] [facility] Grayce Sessions, NP   5 months ago Type 2 diabetes mellitus with other specified complication, with long-term current use of insulin [facility])   [place] [facility] [place], [place] L, RPH-CPP   6 months ago Type 2 diabetes mellitus with other specified complication, with long-term current use of insulin [facility])   [place] [facility] [place], Cornelius Moras, RPH-CPP       Future Appointments             In 3 months Randa Evens, Kinnie Scales, NP [place] [facility]             insulin lispro (HUMALOG KWIKPEN) 100 UNIT/ML KwikPen 15 mL 3    Sig: Inject 14 Units into the skin 3 (three) times daily.     Endocrinology:  Diabetes - Insulins Passed - 05/13/2023  1:08 PM      Passed - HBA1C is between 0 and 7.9 and within 180 days    HbA1c, POC (controlled diabetic range)  Date Value  Ref Range Status  05/12/2023 7.9 (A) 0.0 - 7.0 % Final         Passed - Valid encounter within last 6 months    Recent Outpatient Visits           Yesterday Type 2 diabetes mellitus without complication, without long-term current use of insulin (HCC)   [place] [facility] Grayce Sessions, NP   2 months ago Essential hypertension   [place] [facility] Grayce Sessions, NP   4 months ago Type 2 diabetes mellitus without complication, without long-term current use of insulin (HCC)   [place] [facility] Grayce Sessions, NP   5 months ago Type 2 diabetes mellitus with other specified complication, with long-term current use of insulin [facility])   [place] [facility] [place], Renwick L, RPH-CPP   6 months ago Type 2 diabetes mellitus with other specified complication, with long-term current use of insulin [facility])   [place] [facility] [place], Cornelius Moras, RPH-CPP       Future Appointments             In 3 months Randa Evens, Kinnie Scales, NP [place] [facility]             triamterene-hydrochlorothiazide (MAXZIDE-25) 37.5-25 MG tablet 90 tablet 1    Sig: Take 1 tablet by mouth daily.     Cardiovascular: Diuretic Combos Failed - 05/13/2023  1:08 PM      Failed - Last BP in normal range    BP Readings from Last 1 Encounters:  05/12/23 (!) 175/80         Passed - K in normal range and within 180 days    Potassium  Date Value Ref Range Status  03/10/2023 4.4 3.5 - 5.2 mmol/L Final         Passed - Na in normal range and within 180 days    Sodium  Date Value Ref Range Status  03/10/2023 140 134 - 144 mmol/L Final         Passed - Cr in normal range and within 180 days    Creatinine, Ser  Date Value Ref Range Status  03/10/2023 1.09 0.76 - 1.27 mg/dL Final         Passed - Valid encounter within last 6  months    Recent Outpatient  Visits           Yesterday Type 2 diabetes mellitus without complication, without long-term current use of insulin (HCC)   [place] [facility] Grayce Sessions, NP   2 months ago Essential hypertension   [place] [facility] Grayce Sessions, NP   4 months ago Type 2 diabetes mellitus without complication, without long-term current use of insulin (HCC)   [place] [facility] Grayce Sessions, NP   5 months ago Type 2 diabetes mellitus with other specified complication, with long-term current use of insulin [facility])   [place] [facility] [place], [place] L, RPH-CPP   6 months ago Type 2 diabetes mellitus with other specified complication, with long-term current use of insulin [facility])   [place] [facility] Drucilla [place], RPH-CPP       Future Appointments             In 3 months Randa Evens, Kinnie Scales, NP [place] [facility]

## 2023-05-13 NOTE — Telephone Encounter (Signed)
Patient called stated he needed the medication amLODipine (NORVASC) 10 MG tablet sent to  [facility] [place] - [address] Phone: [phone]  Fax: [phone]     This script was not suppose to be sent to [facility]. All his meds should be sent to [facility] per April from insurance.

## 2023-05-13 NOTE — Telephone Encounter (Signed)
Resending to a different pharmacy.   Requested Prescriptions  Pending Prescriptions Disp Refills   amLODipine (NORVASC) 10 MG tablet 90 tablet 1    Sig: Take 1 tablet (10 mg total) by mouth daily.     Cardiovascular: Calcium Channel Blockers 2 Failed - 05/13/2023  1:10 PM      Failed - Last BP in normal range    BP Readings from Last 1 Encounters:  05/12/23 (!) 175/80         Passed - Last Heart Rate in normal range    Pulse Readings from Last 1 Encounters:  05/12/23 80         Passed - Valid encounter within last 6 months    Recent Outpatient Visits           Yesterday Type 2 diabetes mellitus without complication, without long-term current use of insulin (HCC)   [place] [facility] Grayce Sessions, NP   2 months ago Essential hypertension   [place] [facility] Grayce Sessions, NP   4 months ago Type 2 diabetes mellitus without complication, without long-term current use of insulin (HCC)   [place] [facility] Grayce Sessions, NP   5 months ago Type 2 diabetes mellitus with other specified complication, with long-term current use of insulin [facility])   [place] [facility] [place], [place] L, RPH-CPP   6 months ago Type 2 diabetes mellitus with other specified complication, with long-term current use of insulin [facility])   [place] [facility] [place], Cornelius Moras, RPH-CPP       Future Appointments             In 3 months Randa Evens, Kinnie Scales, NP [place] [facility]

## 2023-05-13 NOTE — Telephone Encounter (Signed)
Requested medication (s) are due for refill today: Yes  Requested medication (s) are on the active medication list: Yes  Last refill:  03/23/21  Future visit scheduled: Yes  Notes to clinic:  Unable to refill per protocol, last refill by another provider.      Requested Prescriptions  Pending Prescriptions Disp Refills   ASPIRIN LOW DOSE 81 MG tablet 30 tablet     Sig: Take 1 tablet (81 mg total) by mouth daily.     Analgesics:  NSAIDS - aspirin Passed - 05/13/2023  1:08 PM      Passed - Cr in normal range and within 360 days    Creatinine, Ser  Date Value Ref Range Status  03/10/2023 1.09 0.76 - 1.27 mg/dL Final         Passed - eGFR is 10 or above and within 360 days    GFR calc Af Amer  Date Value Ref Range Status  11/17/2015 >60 >60 mL/min Final    Comment:    (NOTE) The eGFR has been calculated using the CKD EPI equation. This calculation has not been validated in all clinical situations. eGFR's persistently <60 mL/min signify possible Chronic Kidney Disease.    GFR, Estimated  Date Value Ref Range Status  04/22/2021 >60 >60 mL/min Final    Comment:    (NOTE) Calculated using the CKD-EPI Creatinine Equation (2021)    eGFR  Date Value Ref Range Status  03/10/2023 78 >59 mL/min/1.73 Final         Passed - Patient is not pregnant      Passed - Valid encounter within last 12 months    Recent Outpatient Visits           Yesterday Type 2 diabetes mellitus without complication, without long-term current use of insulin (HCC)   [place] [facility] Grayce Sessions, NP   2 months ago Essential hypertension   [place] [facility] Grayce Sessions, NP   4 months ago Type 2 diabetes mellitus without complication, without long-term current use of insulin (HCC)   [place] [facility] Grayce Sessions, NP   5 months ago Type 2 diabetes mellitus with other specified complication, with long-term current  use of insulin [facility])   [place] [place], [place] L, RPH-CPP   6 months ago Type 2 diabetes mellitus with other specified complication, with long-term current use of insulin [facility])   [place] [facility] [place], Cornelius Moras, RPH-CPP       Future Appointments             In 3 months Randa Evens, Kinnie Scales, NP [place] [facility]            Signed Prescriptions Disp Refills   insulin glargine (LANTUS SOLOSTAR) 100 UNIT/ML Solostar Pen 45 mL 2    Sig: Inject 40 Units into the skin daily.     Endocrinology:  Diabetes - Insulins Passed - 05/13/2023  1:08 PM      Passed - HBA1C is between 0 and 7.9 and within 180 days    HbA1c, POC (controlled diabetic range)  Date Value Ref Range Status  05/12/2023 7.9 (A) 0.0 - 7.0 % Final         Passed - Valid encounter within last 6 months    Recent Outpatient Visits           Yesterday Type 2 diabetes mellitus  without complication, without long-term current use of insulin (HCC)   [place] [facility] Grayce Sessions, NP   2 months ago Essential hypertension   [place] [facility] Grayce Sessions, NP   4 months ago Type 2 diabetes mellitus without complication, without long-term current use of insulin (HCC)   [place] [facility] Grayce Sessions, NP   5 months ago Type 2 diabetes mellitus with other specified complication, with long-term current use of insulin [facility])   [place] [facility] [place], Brooks L, RPH-CPP   6 months ago Type 2 diabetes mellitus with other specified complication, with long-term current use of insulin [facility])   [address] [facility] [place], Cornelius Moras, RPH-CPP       Future Appointments             In 3 months Randa Evens, Kinnie Scales, NP [place] [facility]             insulin  lispro (HUMALOG KWIKPEN) 100 UNIT/ML KwikPen 15 mL 3    Sig: Inject 14 Units into the skin 3 (three) times daily.     Endocrinology:  Diabetes - Insulins Passed - 05/13/2023  1:08 PM      Passed - HBA1C is between 0 and 7.9 and within 180 days    HbA1c, POC (controlled diabetic range)  Date Value Ref Range Status  05/12/2023 7.9 (A) 0.0 - 7.0 % Final         Passed - Valid encounter within last 6 months    Recent Outpatient Visits           Yesterday Type 2 diabetes mellitus without complication, without long-term current use of insulin (HCC)   [place] [facility] Grayce Sessions, NP   2 months ago Essential hypertension   [place] [facility] Grayce Sessions, NP   4 months ago Type 2 diabetes mellitus without complication, without long-term current use of insulin (HCC)   [place] [facility] Grayce Sessions, NP   5 months ago Type 2 diabetes mellitus with other specified complication, with long-term current use of insulin [facility])   [place] [facility] [place], [place] L, RPH-CPP   6 months ago Type 2 diabetes mellitus with other specified complication, with long-term current use of insulin [facility])   [place] [facility] [place], Cornelius Moras, RPH-CPP       Future Appointments             In 3 months Randa Evens, Kinnie Scales, NP  [facility]             triamterene-hydrochlorothiazide (MAXZIDE-25) 37.5-25 MG tablet 90 tablet 1    Sig: Take 1 tablet by mouth daily.     Cardiovascular: Diuretic Combos Failed - 05/13/2023  1:08 PM      Failed - Last BP in normal range    BP Readings from Last 1 Encounters:  05/12/23 (!) 175/80         Passed - K in normal range and within 180 days    Potassium  Date Value Ref Range Status  03/10/2023 4.4 3.5 - 5.2 mmol/L Final         Passed - Na in normal range and within 180 days     Sodium  Date Value Ref Range Status  03/10/2023 140 134 -  144 mmol/L Final         Passed - Cr in normal range and within 180 days    Creatinine, Ser  Date Value Ref Range Status  03/10/2023 1.09 0.76 - 1.27 mg/dL Final         Passed - Valid encounter within last 6 months    Recent Outpatient Visits           Yesterday Type 2 diabetes mellitus without complication, without long-term current use of insulin (HCC)   [place] [facility] Grayce Sessions, NP   2 months ago Essential hypertension   [place] [facility] Grayce Sessions, NP   4 months ago Type 2 diabetes mellitus without complication, without long-term current use of insulin (HCC)   [place] [facility] Grayce Sessions, NP   5 months ago Type 2 diabetes mellitus with other specified complication, with long-term current use of insulin [facility])   [place] [facility] [place], [place] L, RPH-CPP   6 months ago Type 2 diabetes mellitus with other specified complication, with long-term current use of insulin [facility])   [place] [facility] [place], Cornelius Moras, RPH-CPP       Future Appointments             In 3 months Randa Evens, Kinnie Scales, NP [place] [facility]

## 2023-05-13 NOTE — Telephone Encounter (Signed)
Medication Refill - Medication:  ASPIRIN LOW DOSE 81 MG EC tablet,  insulin glargine (LANTUS SOLOSTAR) 100 UNIT/ML Solostar Pen,  insulin lispro (HUMALOG KWIKPEN) 100 UNIT/ML KwikPen and  triamterene-hydrochlorothiazide (MAXZIDE-25) 37.5-25 MG tablet    Has the patient contacted their pharmacy? No.  Preferred Pharmacy (with phone number or street name):  [facility] [place] - [address] Phone: [phone]  Fax: [phone]     Has the patient been seen for an appointment in the last year OR does the patient have an upcoming appointment? Yes.    Agent: Please be advised that RX refills may take up to 3 business days. We ask that you follow-up with your pharmacy.

## 2023-07-21 ENCOUNTER — Other Ambulatory Visit (INDEPENDENT_AMBULATORY_CARE_PROVIDER_SITE_OTHER): Payer: Self-pay | Admitting: Primary Care

## 2023-07-21 DIAGNOSIS — I63349 Cerebral infarction due to thrombosis of unspecified cerebellar artery: Secondary | ICD-10-CM

## 2023-07-21 DIAGNOSIS — E119 Type 2 diabetes mellitus without complications: Secondary | ICD-10-CM

## 2023-07-21 DIAGNOSIS — I10 Essential (primary) hypertension: Secondary | ICD-10-CM

## 2023-07-21 DIAGNOSIS — Z760 Encounter for issue of repeat prescription: Secondary | ICD-10-CM

## 2023-07-27 ENCOUNTER — Other Ambulatory Visit (INDEPENDENT_AMBULATORY_CARE_PROVIDER_SITE_OTHER): Payer: Self-pay | Admitting: Primary Care

## 2023-07-27 DIAGNOSIS — Z760 Encounter for issue of repeat prescription: Secondary | ICD-10-CM

## 2023-08-12 ENCOUNTER — Ambulatory Visit (INDEPENDENT_AMBULATORY_CARE_PROVIDER_SITE_OTHER): Payer: 59 | Admitting: Primary Care

## 2023-08-12 ENCOUNTER — Encounter (INDEPENDENT_AMBULATORY_CARE_PROVIDER_SITE_OTHER): Payer: Self-pay | Admitting: Primary Care

## 2023-08-12 VITALS — BP 174/102 | HR 97 | Resp 16 | Ht 65.0 in | Wt 173.0 lb

## 2023-08-12 DIAGNOSIS — Z6828 Body mass index (BMI) 28.0-28.9, adult: Secondary | ICD-10-CM

## 2023-08-12 DIAGNOSIS — R351 Nocturia: Secondary | ICD-10-CM | POA: Diagnosis not present

## 2023-08-12 DIAGNOSIS — I63349 Cerebral infarction due to thrombosis of unspecified cerebellar artery: Secondary | ICD-10-CM | POA: Diagnosis not present

## 2023-08-12 DIAGNOSIS — Z125 Encounter for screening for malignant neoplasm of prostate: Secondary | ICD-10-CM

## 2023-08-12 DIAGNOSIS — E119 Type 2 diabetes mellitus without complications: Secondary | ICD-10-CM

## 2023-08-12 DIAGNOSIS — I10 Essential (primary) hypertension: Secondary | ICD-10-CM | POA: Diagnosis not present

## 2023-08-12 DIAGNOSIS — E785 Hyperlipidemia, unspecified: Secondary | ICD-10-CM | POA: Diagnosis not present

## 2023-08-12 DIAGNOSIS — Z794 Long term (current) use of insulin: Secondary | ICD-10-CM

## 2023-08-12 DIAGNOSIS — E669 Obesity, unspecified: Secondary | ICD-10-CM

## 2023-08-12 LAB — POCT GLYCOSYLATED HEMOGLOBIN (HGB A1C): HbA1c, POC (controlled diabetic range): 8.4 % — AB (ref 0.0–7.0)

## 2023-08-12 NOTE — Progress Notes (Signed)
Subjective:  [name], male    DOB: 1961/01/28  Age: 63 y.o. MRN: [id number]  CC: Diabetes and Hypertension  Spoke with clinical nurse manager regarding needing assistance at home with medication to take consistently what and when to take. Also referred to Clinical pharmacist for possible LIbree. Adam Callahan presents for follow-up of diabetes. Patient does not check blood sugar at home AND htn- Patient has No headache, No chest pain, No abdominal pain - No Nausea, No new weakness tingling or numbness, No Cough - shortness of breath (Left arm hemiparesis)  Compliant with meds - Yes Checking CBGs? No  Fasting avg -   Postprandial average -  Exercising regularly? - No Watching carbohydrate intake? - Yes Neuropathy ? - No Hypoglycemic events - No  - Recovers with :   Pertinent ROS:  Polyuria - No Polydipsia - No Vision problems - No Adam Callahan is very upset neighbor stole his emotional support dog police came because Adam Callahan was not chipped or papers was not able to retrieved dog. Neighbor did not open the door. Medications as noted below. Taking them regularly without complication/adverse reaction being reported today.   History Adam Callahan has a past medical history of Diabetes mellitus, Hypertension, and Stroke (HCC).   Adam Callahan has a past surgical history that includes TEE without cardioversion (N/A, 05/03/2014).   His family history includes Cervical cancer in his mother; Colon cancer in his maternal aunt; Diabetes Mellitus II in his maternal uncle; Hypertension in his sister.Adam Callahan reports that Adam Callahan has never smoked. Adam Callahan has never used smokeless tobacco. Adam Callahan reports that Adam Callahan does not drink alcohol and does not use drugs.  Current Outpatient Medications on File Prior to Visit  Medication Sig Dispense Refill   amLODipine (NORVASC) 10 MG tablet Take 1 tablet (10 mg total) by mouth daily. 90 tablet 1   ASPIRIN LOW DOSE 81 MG EC tablet Take 81 mg by mouth daily.     atorvastatin  (LIPITOR) 40 MG tablet TAKE 1 TABLET BY MOUTH DAILY 90 tablet 1   insulin glargine (LANTUS SOLOSTAR) 100 UNIT/ML Solostar Pen Inject 40 Units into the skin daily. 45 mL 2   insulin lispro (HUMALOG KWIKPEN) 100 UNIT/ML KwikPen Inject 14 Units into the skin 3 (three) times daily. 15 mL 3   triamterene-hydrochlorothiazide (MAXZIDE-25) 37.5-25 MG tablet TAKE 1 TABLET BY MOUTH DAILY 90 tablet 1   No current facility-administered medications on file prior to visit.    Review of Systems Comprehensive ROS Pertinent positive and negative noted in HPI   Objective:  BP (!) 174/102 (BP Location: Left Arm, Patient Position: Sitting, Cuff Size: Normal)   Pulse 97   Resp 16   Ht 5\' 5"  (1.651 m)   Wt 173 lb (78.5 kg)   SpO2 98%   BMI 28.79 kg/m   BP Readings from Last 3 Encounters:  08/12/23 (!) 174/102  05/12/23 (!) 175/80  03/10/23 (!) 151/88    Wt Readings from Last 3 Encounters:  08/12/23 173 lb (78.5 kg)  05/12/23 172 lb 9.6 oz (78.3 kg)  03/10/23 176 lb 6.4 oz (80 kg)    Physical Exam Vitals reviewed.  Constitutional:      Appearance: Adam Callahan is obese.  HENT:     Head: Normocephalic.     Nose: Nose normal.  Eyes:     Extraocular Movements: Extraocular movements intact.  Cardiovascular:     Rate and Rhythm: Normal rate and regular rhythm.  Pulmonary:     Effort: Pulmonary  effort is normal.     Breath sounds: Normal breath sounds.  Abdominal:     General: Bowel sounds are normal. There is distension.     Palpations: Abdomen is soft.  Musculoskeletal:     Cervical back: Normal range of motion.     Comments: Right side weakness  Skin:    General: Skin is warm and dry.  Neurological:     Mental Status: Adam Callahan is alert and oriented to person, place, and time.  Psychiatric:        Mood and Affect: Mood normal.        Behavior: Behavior normal.     Lab Results  Component Value Date   HGBA1C 8.4 (A) 08/12/2023   HGBA1C 7.9 (A) 05/12/2023   HGBA1C 9.5 (A) 01/07/2023    Lab  Results  Component Value Date   WBC 5.6 09/10/2022   HGB 12.2 (L) 09/10/2022   HCT 36.3 (L) 09/10/2022   PLT 313 09/10/2022   GLUCOSE 184 (H) 03/10/2023   CHOL 131 09/10/2022   TRIG 62 09/10/2022   HDL 41 09/10/2022   LDLCALC 77 09/10/2022   ALT 20 03/10/2023   AST 21 03/10/2023   NA 140 03/10/2023   K 4.4 03/10/2023   CL 100 03/10/2023   CREATININE 1.09 03/10/2023   BUN 21 03/10/2023   CO2 24 03/10/2023   TSH 0.478 06/06/2013   INR 1.04 04/27/2014   HGBA1C 8.4 (A) 08/12/2023     Assessment & Plan:   Sou was seen today for diabetes and hypertension.  Diagnoses and all orders for this visit:  Type 2 diabetes mellitus without complication, without long-term current use of insulin (HCC) - educated on lifestyle modifications, including but not limited to diet choices and adding exercise to daily routine.   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  The above assessment and management plan was discussed with the patient. The patient verbalized understanding of and has agreed to the management plan. Patient is aware to call the clinic if symptoms  fail to improve or worsen. Patient is aware when to return to the clinic for a follow-up visit. Patient educated on when it is appropriate to go to the emergency department.   Gwinda Passe, NP-C

## 2023-08-13 LAB — CBC WITH DIFFERENTIAL/PLATELET
Basophils Absolute: 0.1 10*3/uL (ref 0.0–0.2)
Basos: 1 %
EOS (ABSOLUTE): 0.1 10*3/uL (ref 0.0–0.4)
Eos: 2 %
Hematocrit: 40.8 % (ref 37.5–51.0)
Hemoglobin: 13.5 g/dL (ref 13.0–17.7)
Immature Grans (Abs): 0 10*3/uL (ref 0.0–0.1)
Immature Granulocytes: 0 %
Lymphocytes Absolute: 1.9 10*3/uL (ref 0.7–3.1)
Lymphs: 33 %
MCH: 29.4 pg (ref 26.6–33.0)
MCHC: 33.1 g/dL (ref 31.5–35.7)
MCV: 89 fL (ref 79–97)
Monocytes Absolute: 0.4 10*3/uL (ref 0.1–0.9)
Monocytes: 6 %
Neutrophils Absolute: 3.3 10*3/uL (ref 1.4–7.0)
Neutrophils: 58 %
Platelets: 326 10*3/uL (ref 150–450)
RBC: 4.59 x10E6/uL (ref 4.14–5.80)
RDW: 12.4 % (ref 11.6–15.4)
WBC: 5.8 10*3/uL (ref 3.4–10.8)

## 2023-08-13 LAB — CMP14+EGFR
ALT: 16 [IU]/L (ref 0–44)
AST: 19 [IU]/L (ref 0–40)
Albumin: 4.1 g/dL (ref 3.9–4.9)
Alkaline Phosphatase: 69 [IU]/L (ref 44–121)
BUN/Creatinine Ratio: 15 (ref 10–24)
BUN: 13 mg/dL (ref 8–27)
Bilirubin Total: 0.5 mg/dL (ref 0.0–1.2)
CO2: 28 mmol/L (ref 20–29)
Calcium: 9.3 mg/dL (ref 8.6–10.2)
Chloride: 97 mmol/L (ref 96–106)
Creatinine, Ser: 0.85 mg/dL (ref 0.76–1.27)
Globulin, Total: 3.3 g/dL (ref 1.5–4.5)
Glucose: 125 mg/dL — ABNORMAL HIGH (ref 70–99)
Potassium: 3.8 mmol/L (ref 3.5–5.2)
Sodium: 139 mmol/L (ref 134–144)
Total Protein: 7.4 g/dL (ref 6.0–8.5)
eGFR: 99 mL/min/{1.73_m2} (ref >59)

## 2023-08-13 LAB — LIPID PANEL
Chol/HDL Ratio: 2.6 {ratio} (ref 0.0–5.0)
Cholesterol, Total: 114 mg/dL (ref 100–199)
HDL: 44 mg/dL (ref >39)
LDL Chol Calc (NIH): 58 mg/dL (ref 0–99)
Triglycerides: 54 mg/dL (ref 0–149)
VLDL Cholesterol Cal: 12 mg/dL (ref 5–40)

## 2023-08-13 LAB — PSA: Prostate Specific Ag, Serum: 2.2 ng/mL (ref 0.0–4.0)

## 2023-08-19 ENCOUNTER — Other Ambulatory Visit (INDEPENDENT_AMBULATORY_CARE_PROVIDER_SITE_OTHER): Payer: Self-pay | Admitting: Primary Care

## 2023-08-19 MED ORDER — ATORVASTATIN CALCIUM 20 MG PO TABS: 20.0000 mg | ORAL_TABLET | Freq: Every day | ORAL | 1 refills | Status: DC

## 2023-08-20 ENCOUNTER — Telehealth: Payer: Self-pay

## 2023-08-20 NOTE — Telephone Encounter (Signed)
Completed Forensic scientist referral emailed to [employer]. The patient was in agreement with placing this referral when I spoke to him while he was at his appointment with Gwinda Passe, NP on 08/12/2023.

## 2023-08-21 ENCOUNTER — Telehealth (HOSPITAL_COMMUNITY): Payer: Self-pay | Admitting: Emergency Medicine

## 2023-08-21 NOTE — Telephone Encounter (Signed)
LVM w/ my contact information for someone to return my call.  Attempting to reach Mr. Arlyn Buerkle, EMT-Paramedic [phone] 08/21/2023

## 2023-08-21 NOTE — Telephone Encounter (Signed)
Attempted to contact Mr Flinn regarding paramedicine program and try and schedule home visit.  No answer LVM    Beatrix Shipper, EMT-Paramedic [phone] 08/21/2023

## 2023-08-22 ENCOUNTER — Telehealth (HOSPITAL_COMMUNITY): Payer: Self-pay | Admitting: Emergency Medicine

## 2023-08-22 NOTE — Telephone Encounter (Signed)
No answer, VM full could not leave message.    Beatrix Shipper, EMT-Paramedic [phone] 08/22/2023

## 2023-08-22 NOTE — Telephone Encounter (Signed)
Spoke with daughter.  She states that he does not always answer the phone.  I discussed the paramedicine program briefly  and asked her to tell him to call me when she speaks to him.  Also, she stated that calling around 1:00 today might be a good time to reach him.  I will try at that time.    Beatrix Shipper, EMT-Paramedic [phone] 08/22/2023

## 2023-08-24 NOTE — Progress Notes (Deleted)
 S:     No chief complaint on file.  63 y.o. male who presents for diabetes evaluation, education, and management. Patient arrives in *** good spirits and presents without *** any assistance. ***Patient is accompanied by ***.   Patient was referred and last seen by Primary Care Provider, PCP Gwinda Passe, on 08/12/23. 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 Patient was last seen by pharmacy in May 2024. At last PCP visit, A1C had increased from 7.9% to 8.4% and BP was markedly elevated at 174/102, though patient was upset at the time. Patient was referred to paramedicine program, given difficulties taking his medications consistently. CGM (FL2) was prescribed and dispensed twice in 2024, unsure whether patient is using.   Patient reports Diabetes was diagnosed in 2014.   Family/Social History: *** premature ASCVD?  Current diabetes medications include: insulin glargine (Lantus) 40 units daily, insulin lispro (Humalog) 14 units TID with meals DM meds previously tried: dulaglutide ***  Current hyperlipidemia medications include: atorvastatin 20 mg PO daily, ASA 81 mg ***  Patient reports adherence to taking all medications as prescribed.  *** Patient denies adherence with medications, reports missing *** medications *** times per week, on average. Patient is using multiple pharmacies ([facility], [facility])  Do you feel that your medications are working for you? {YES NO:22349} Have you been experiencing any side effects to the medications prescribed? {YES NO:22349} Do you have any problems obtaining medications due to transportation or finances? {YES [id number] Insurance coverage: UHC Dual Complete  Patient {Actions; denies-reports:120008} hypoglycemic events.  Reported home fasting blood sugars: ***  Reported 2 hour post-meal/random blood sugars: ***.  Patient {Actions; denies-reports:120008} nocturia (nighttime urination).   Patient {Actions; denies-reports:120008} neuropathy (nerve pain). Patient {Actions; denies-reports:120008} visual changes. Patient {Actions; denies-reports:120008} self foot exams.   Patient reported dietary habits: Eats *** meals/day Breakfast: *** Lunch: *** Dinner: *** Snacks: *** Drinks: ***  Within the past 12 months, did you worry whether your food would run out before you got money to buy more? {YES NO:22349} Within the past 12 months, did the food you bought run out, and you didn't have money to get more? {YES NO:22349} PHQ-9 Score: ***  Patient-reported exercise habits: ***   Hypertension:  Current medications: amlodipine 10 mg PO daily, triamterene-hydrochlorothiazide 37.5-25 mg PO daily Medications previously tried:   Patient {HAS/DOES NOT ZOXW:96045} a validated, automated, upper arm home BP cuff Current blood pressure readings readings: ***  Patient {Actions; denies-reports:120008} hypotensive s/sx including ***dizziness, lightheadedness.  Patient {Actions; denies-reports:120008} hypertensive symptoms including ***headache, chest pain, shortness of breath  Current meal patterns: ***  Current physical activity: ***   O:   ROS  Physical Exam  7 day average blood glucose: ***  Libre3 CGM Download today *** % Time CGM is active: ***% Average Glucose: *** mg/dL Glucose Management Indicator: ***  Glucose Variability: ***% (goal <36%) Time in Goal:  - Time in range 70-180: ***% - Time above range: ***% - Time below range: ***% Observed patterns:   Lab Results  Component Value Date   HGBA1C 8.4 (A) 08/12/2023   There were no vitals filed for this visit.  Lipid Panel     Component Value Date/Time   CHOL 114 08/12/2023 1455   TRIG 54 08/12/2023 1455   HDL 44 08/12/2023 1455   CHOLHDL 2.6 08/12/2023 1455   CHOLHDL 4.8 04/30/2014 0015   VLDL 18 04/30/2014 0015   LDLCALC 58 08/12/2023 1455  Latest Ref Rng & Units 08/12/2023    2:55 PM  03/10/2023    2:29 PM 09/10/2022    1:50 PM  BMP  Glucose 70 - 99 mg/dL 161  096  045   BUN 8 - 27 mg/dL 13  21  13    Creatinine 0.76 - 1.27 mg/dL 4.09  8.11  9.14   BUN/Creat Ratio 10 - 24 15  19  12    Sodium 134 - 144 mmol/L 139  140  141   Potassium 3.5 - 5.2 mmol/L 3.8  4.4  4.8   Chloride 96 - 106 mmol/L 97  100  105   CO2 20 - 29 mmol/L 28  24  24    Calcium 8.6 - 10.2 mg/dL 9.3  9.3  9.3      Clinical Atherosclerotic Cardiovascular Disease (ASCVD): Yes  The ASCVD Risk score (Arnett DK, et al., 2019) failed to calculate for the following reasons:   Risk score cannot be calculated because patient has a medical history suggesting prior/existing ASCVD   Patient is participating in a Managed Medicaid Plan:  No- UHC Dual Complete  DM uncontrolled - check adherence, try to add GLP-1 given stroke history (work with paramedicine?). Ozempic, since he already knows how to use insulin. If reporting hypo> decrease prandial.  Try to get CGM Was stated on Trulicity in 2022 - no dcoumentation what happened  HTN - change triamterene- hydrochlorothiazide to ARB-hydrochlorothiazide? 160-25 mg PO daily HLD- confirm taking ASA, incr to high intensity statin to get to goal < 55 mg/dL (just dispensed 20 mg tabs)  Prioritize HTN and DM if limited medication changes can be made  A/P: Diabetes longstanding *** currently ***. Patient is *** able to verbalize appropriate hypoglycemia management plan. Medication adherence appears ***. Control is suboptimal due to ***. -{Meds adjust:18428} basal insulin *** Lantus/Basaglar/Semglee (insulin glargine) *** Tresiba (insulin degludec) from *** units to *** units daily in the morning. Patient will continue to titrate 1 unit every *** days if fasting blood sugar > 100mg /dl until fasting blood sugars reach goal or next visit.  -{Meds adjust:18428} rapid insulin *** Novolog (insulin aspart) *** Humalog (insulin lispro) from *** to ***.  -{Meds adjust:18428} GLP-1 ***  Trulicity (dulaglutide) *** Ozempic (semaglutide) *** Mounjaro (tirzepatide) from *** mg to *** mg .  -{Meds adjust:18428} SGLT2-I *** Farxiga (dapagliflozin) *** Jardiance (empagliflozin) 10 mg. Counseled on sick day rules. -{Meds adjust:18428} metformin ***.  -Patient educated on purpose, proper use, and potential adverse effects of ***.  -Extensively discussed pathophysiology of diabetes, recommended lifestyle interventions, dietary effects on blood sugar control.  -Counseled on s/sx of and management of hypoglycemia.  -Next A1c anticipated ***.   ASCVD risk - primary ***secondary prevention in patient with diabetes. Last LDL is *** not at goal of <78 *** mg/dL. ASCVD risk factors include *** and 10-year ASCVD risk score of ***. {Desc; low/moderate/high:110033} intensity statin indicated.  -{Meds adjust:18428} ***statin *** mg.   Hypertension longstanding *** currently ***. Blood pressure goal of <130/80 *** mmHg. Medication adherence ***. Blood pressure control is suboptimal due to ***. -{Meds adjust:18428} *** mg.  Written patient instructions provided. Patient verbalized understanding of treatment plan.  Total time in face to face counseling *** minutes.    Follow-up:  Pharmacist *** PCP clinic visit in 11/01/23  Nils Pyle, PharmD PGY1 Pharmacy Resident

## 2023-08-25 ENCOUNTER — Ambulatory Visit: Payer: 59 | Admitting: Pharmacist

## 2023-08-26 ENCOUNTER — Other Ambulatory Visit (HOSPITAL_COMMUNITY): Payer: Self-pay | Admitting: Emergency Medicine

## 2023-08-26 NOTE — Progress Notes (Signed)
 Have been unable to reach Mr. Wence by phone.  Dropped by his residence to introduce myself and schedule an initial home visit for tomorrow @ 12:15. Pt tells me that his phone has not been working today.  He called his daughter from my phone and left her a voicemail letting her know she needed to help him get his phone back in service.    [name], EMT-Paramedic [phone] 08/26/2023

## 2023-08-27 ENCOUNTER — Other Ambulatory Visit (INDEPENDENT_AMBULATORY_CARE_PROVIDER_SITE_OTHER): Payer: Self-pay | Admitting: Primary Care

## 2023-08-27 ENCOUNTER — Other Ambulatory Visit (HOSPITAL_COMMUNITY): Payer: Self-pay | Admitting: Emergency Medicine

## 2023-08-27 DIAGNOSIS — E119 Type 2 diabetes mellitus without complications: Secondary | ICD-10-CM

## 2023-08-27 MED ORDER — PEN NEEDLES 32G X 5 MM MISC: 2 refills | Status: DC

## 2023-08-27 NOTE — Telephone Encounter (Signed)
 Copied from CRM [id number]. Topic: Clinical - Prescription Issue >> Aug 27, 2023 12:48 PM Nestora J wrote: Reason for CRM: Pts states that he has the medicine for insulin  glargine (LANTUS  SOLOSTAR) 100 UNIT & insulin  lispro (HUMALOG  KWIKPEN) 100 UNIT but he doesn't have the needles and wants to know if a prescription can be written for them Callback # [phone]  Needles sent to pharmacy

## 2023-08-27 NOTE — Telephone Encounter (Signed)
 Copied from CRM [id number]. Topic: Clinical - Medication Refill >> Aug 27, 2023 12:49 PM Nestora J wrote: Most Recent Primary Care Visit:  Provider: CELESTIA ROSALINE SQUIBB  Department: [facility]  Visit Type: OFFICE VISIT  Date: 08/12/2023  Medication: ASPIRIN  LOW DOSE 81 MG   Has the patient contacted their pharmacy? Yes (Agent: If no, request that the patient contact the pharmacy for the refill. If patient does not wish to contact the pharmacy document the reason why and proceed with request.) (Agent: If yes, when and what did the pharmacy advise?) Contact PCP  Is this the correct pharmacy for this prescription? Yes If no, delete pharmacy and type the correct one.  This is the patient's preferred pharmacy:  [facility] [phone] - [place], [place] - [address] AT [facility] [address] [place] [address] Phone: [phone] Fax: [phone]     Has the prescription been filled recently? No  Is the patient out of the medication? Yes  Has the patient been seen for an appointment in the last year OR does the patient have an upcoming appointment? Yes  Can we respond through MyChart? No  Agent: Please be advised that Rx refills may take up to 3 business days. We ask that you follow-up with your pharmacy.

## 2023-08-27 NOTE — Progress Notes (Signed)
 SOCIAL/MEDICAL BARRIERS:  [facility]   MED ISSUES:  AFFORDABILITY NO  PT ASSIST APPS NEEDED NO  PCP Rosaline Bohr, NP   INSURANCE UHC Medicare Dual Complete  SOURCE OF INCOME Disability  TRANSPORTATION  Public transportation  FOOD INSECURITIES/NEEDS used to get [employer] FOOD STAMPS Needs to apply  REVIEWED DIET/FLUID/SALT RESTRICTIONS YES  RENT/OWN HOME ISSUES RENT  SOCIAL SUPPORT  Son & Daughter  SAFETY/DOMESTIC ISSUES NONE  SUBSTANCE ABUSE NONE  DAILY WEIGHTS NEEDS SCALE  EDUCATE ON DISEASE PROCESS/SYMPTOMS/PURPOSES OF MEDS YES  Pt has been filling his own pill box.  I discovered he had inadvertently been taking 2 Amlodipine  10mg  tablets.  They were from different manufacturers and one was green in color and one was white.  I reconciled pill box to reflect correct meds and doses.  Requested refills for his needles for his Lantus  and Humalog  kwickpens.   Next home visit 09/03/23 @ 12:00    [name], EMT-Paramedic [phone] 08/28/2023

## 2023-08-28 ENCOUNTER — Telehealth (HOSPITAL_COMMUNITY): Payer: Self-pay | Admitting: Emergency Medicine

## 2023-08-28 NOTE — Telephone Encounter (Signed)
 Called to follow up with Adam Callahan to confirm that he was able to pick up his needles for his Lantus  and Humalog .  He advised that he had.    Ermalinda Hays, EMT-Paramedic [phone] 08/28/2023

## 2023-09-03 ENCOUNTER — Other Ambulatory Visit (HOSPITAL_COMMUNITY): Payer: Self-pay | Admitting: Emergency Medicine

## 2023-09-03 NOTE — Progress Notes (Signed)
Paramedicine Encounter    [name], male    DOB: January 16, 1961, 63 y.o.   MRN: [id number]   Complaints NONE  Assessment A&O x 4, skin W&D w/ good color.  Lung sounds clear and equal bilat.  Denies chest pain or SOB. No peripheral edema noted.  Compliance with meds unsure  Pill box filled  x 1 week   Refills needed ASA  Meds changes since last visit NONE    Social changes NONE   BP (!) 150/80 (BP Location: Left Arm, Patient Position: Sitting, Cuff Size: Normal)   Pulse 83   SpO2 99%  Weight yesterday-  Last visit weight-  CBG 88  ATF Mr. Zellars A&O x 4, skin W&D w/ good color.  Pt. Had tried to fill his own pill box prior to my arrival today.  It appears he is doubling up on his Amlodopine and Atorvastatin- two different doses 40mg . And 20mg .   I deconstructed his pill box.  Provided him a new pill box and reconciled x 1 week.  Discussed w/ him letting me fill his box instead of him trying to do it and he agreed. Pt had purchased ASA but has misplaced it prior to this visit.  Pt advises he will try and find it and I instructed him to place 1 tablet 81mg . ASA each day in his box and he advises he understands same. Pt's BP was elevated today but pt had not taken his daily med dose.   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) 07/08/2014   Homonymous hemianopsia following cerebrovascular accident 06/06/2014   Acute left hemiparesis (HCC) 05/04/2014   Stroke (HCC) 04/30/2014   Spastic  hemiplegia affecting dominant side (HCC) 10/01/2013   Aphasia as late effect of cerebrovascular accident 10/01/2013   Alterations of sensations, late effect of cerebrovascular disease(438.6) 10/01/2013   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   Dehydration 06/07/2013   Hypokalemia 06/07/2013   Dyslipidemia 06/07/2013   Cerebral infarction (HCC) 06/06/2013   Diabetes mellitus type 2, uncontrolled 06/06/2013   Rhabdomyolysis 06/06/2013    Current Outpatient Medications:    amLODipine (NORVASC) 10 MG tablet, Take 1 tablet (10 mg total) by mouth daily., Disp: 90 tablet, Rfl: 1   atorvastatin (LIPITOR) 20 MG tablet, Take 1 tablet (20 mg total) by mouth daily., Disp: 90 tablet, Rfl: 1   insulin glargine (LANTUS SOLOSTAR) 100 UNIT/ML Solostar Pen, Inject 40 Units into the skin daily., Disp: 45 mL, Rfl: 2   insulin lispro (HUMALOG KWIKPEN) 100 UNIT/ML KwikPen, Inject 14 Units into the skin 3 (three) times daily., Disp: 15 mL, Rfl: 3   Insulin Pen Needle (PEN NEEDLES) 32G X 5 MM MISC, Patient to use needles with pen as needed for injection of insulin, Disp: 100 each, Rfl: 2   triamterene-hydrochlorothiazide (MAXZIDE-25) 37.5-25 MG tablet, TAKE 1 TABLET BY MOUTH DAILY, Disp: 90 tablet, Rfl: 1   ASPIRIN LOW DOSE 81 MG EC tablet, Take 81 mg by mouth daily. (Patient not taking: Reported on 09/03/2023), Disp: , Rfl:  No  Known Allergies   Social History   Socioeconomic History   Marital status: Divorced    Spouse name: Not on file   Number of children: 2   Years of education: 12   Highest education level: Not on file  Occupational History   Not on file  Tobacco Use   Smoking status: Never   Smokeless tobacco: Never  Vaping Use   Vaping status: Never Used  Substance and Sexual Activity   Alcohol use: No    Alcohol/week: 0.0 standard drinks of alcohol   Drug use: No   Sexual activity: Not Currently  Other Topics Concern   Not on file  Social History Narrative   Patient is single and  has 2 children.   Patient is right handed.   Patient has hs education.   Patient drinks diet caffeine free sodas daily.   Social Drivers of Health   Financial Resource Strain: Medium Risk (10/24/2022)   Overall Financial Resource Strain (CARDIA)    Difficulty of Paying Living Expenses: Somewhat hard  Food Insecurity: Food Insecurity Present (10/24/2022)   Hunger Vital Sign    Worried About Running Out of Food in the Last Year: Sometimes true    Ran Out of Food in the Last Year: Never true  Transportation Needs: Unmet Transportation Needs (10/24/2022)   PRAPARE - Administrator, Civil Service (Medical): Yes    Lack of Transportation (Non-Medical): Yes  Physical Activity: Insufficiently Active (10/24/2022)   Exercise Vital Sign    Days of Exercise per Week: 4 days    Minutes of Exercise per Session: 30 min  Stress: No Stress Concern Present (10/24/2022)   [employer] of Occupational Health - Occupational Stress Questionnaire    Feeling of Stress : Not at all  Social Connections: Moderately Integrated (10/24/2022)   Social Connection and Isolation Panel [NHANES]    Frequency of Communication with Friends and Family: Three times a week    Frequency of Social Gatherings with Friends and Family: Three times a week    Attends Religious Services: More than 4 times per year    Active Member of Clubs or Organizations: Yes    Attends Banker Meetings: More than 4 times per year    Marital Status: Widowed  Intimate Partner Violence: Not At Risk (10/24/2022)   Humiliation, Afraid, Rape, and Kick questionnaire    Fear of Current or Ex-Partner: No    Emotionally Abused: No    Physically Abused: No    Sexually Abused: No    Physical Exam      Future Appointments  Date Time Provider Department Center  09/17/2023  3:10 PM [facility] None  11/10/2023  2:30 PM Grayce Sessions, NP [facility] None

## 2023-09-10 ENCOUNTER — Telehealth (HOSPITAL_COMMUNITY): Payer: Self-pay

## 2023-09-10 ENCOUNTER — Other Ambulatory Visit (HOSPITAL_COMMUNITY): Payer: Self-pay | Admitting: Emergency Medicine

## 2023-09-10 NOTE — Progress Notes (Signed)
 Paramedicine Encounter    [name], male    DOB: 13-Feb-1961, 63 y.o.   MRN: [id number]   Complaints NONE  Assessment A&O x 4, skin W&D w/ good color.  Pt denies chest pain or SOB.  Hypertension- denies headache or dizzines  Compliance with meds No - looks like meds got mixed together and missed several doses. Discussed compliance   Pill box filled x 1 week All meds in AM only  Refills needed NONE  Meds changes since last visit NONE    Social changes NONE   BP (!) 160/88 (BP Location: Right Arm, Patient Position: Sitting, Cuff Size: Normal)   Pulse 80   Resp 16   SpO2 99%  Weight yesterday-not taken Last visit weight-not taken - no scale  ATF Adam Callahan A&O x 4, skin W&D w/ good color.  He has no complaints at today's visit.  He has not been totally compliant w/ his medications and has not yet taken his meds for today yet.  He states he hasn't been up long and has not yet eaten today.  I encouraged him to do so and also encouraged him to try and get up earlier in the morning and he could go back to bed if he would like after he's taken his morning regimen. Pill box reconciled x 1 week.  All meds are in the A.M.  Pt had purchased ASA last week but has misplaced them.  I will facilitate him getting same. Next home visit 09/17/23 @ 10:00.   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) 07/08/2014   Homonymous hemianopsia following cerebrovascular accident 06/06/2014   Acute left hemiparesis (HCC) 05/04/2014   Stroke  (HCC) 04/30/2014   Spastic hemiplegia affecting dominant side (HCC) 10/01/2013   Aphasia as late effect of cerebrovascular accident 10/01/2013   Alterations of sensations, late effect of cerebrovascular disease(438.6) 10/01/2013   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   Dehydration 06/07/2013   Hypokalemia 06/07/2013   Dyslipidemia 06/07/2013   Cerebral infarction (HCC) 06/06/2013   Diabetes mellitus type 2, uncontrolled 06/06/2013   Rhabdomyolysis 06/06/2013    Current Outpatient Medications:    amLODipine (NORVASC) 10 MG tablet, Take 1 tablet (10 mg total) by mouth daily., Disp: 90 tablet, Rfl: 1   atorvastatin (LIPITOR) 20 MG tablet, Take 1 tablet (20 mg total) by mouth daily., Disp: 90 tablet, Rfl: 1   insulin glargine (LANTUS SOLOSTAR) 100 UNIT/ML Solostar Pen, Inject 40 Units into the skin daily., Disp: 45 mL, Rfl: 2   insulin lispro (HUMALOG KWIKPEN) 100 UNIT/ML KwikPen, Inject 14 Units into the skin 3 (three) times daily., Disp: 15 mL, Rfl: 3   Insulin Pen Needle (PEN NEEDLES) 32G X 5 MM MISC, Patient to use needles with pen as needed for injection of insulin, Disp: 100 each, Rfl: 2   triamterene-hydrochlorothiazide (MAXZIDE-25) 37.5-25 MG tablet, TAKE 1 TABLET BY MOUTH DAILY, Disp: 90 tablet, Rfl: 1   ASPIRIN LOW DOSE 81 MG EC tablet, Take 81 mg by mouth daily. (Patient not taking: Reported on 08/27/2023), Disp: , Rfl:  No Known  Allergies   Social History   Socioeconomic History   Marital status: Divorced    Spouse name: Not on file   Number of children: 2   Years of education: 12   Highest education level: Not on file  Occupational History   Not on file  Tobacco Use   Smoking status: Never   Smokeless tobacco: Never  Vaping Use   Vaping status: Never Used  Substance and Sexual Activity   Alcohol use: No    Alcohol/week: 0.0 standard drinks of alcohol   Drug use: No   Sexual activity: Not Currently  Other Topics Concern   Not on file  Social History  Narrative   Patient is single and has 2 children.   Patient is right handed.   Patient has hs education.   Patient drinks diet caffeine free sodas daily.   Social Drivers of Health   Financial Resource Strain: Medium Risk (10/24/2022)   Overall Financial Resource Strain (CARDIA)    Difficulty of Paying Living Expenses: Somewhat hard  Food Insecurity: Food Insecurity Present (10/24/2022)   Hunger Vital Sign    Worried About Running Out of Food in the Last Year: Sometimes true    Ran Out of Food in the Last Year: Never true  Transportation Needs: Unmet Transportation Needs (10/24/2022)   PRAPARE - Administrator, Civil Service (Medical): Yes    Lack of Transportation (Non-Medical): Yes  Physical Activity: Insufficiently Active (10/24/2022)   Exercise Vital Sign    Days of Exercise per Week: 4 days    Minutes of Exercise per Session: 30 min  Stress: No Stress Concern Present (10/24/2022)   [employer] of Occupational Health - Occupational Stress Questionnaire    Feeling of Stress : Not at all  Social Connections: Moderately Integrated (10/24/2022)   Social Connection and Isolation Panel [NHANES]    Frequency of Communication with Friends and Family: Three times a week    Frequency of Social Gatherings with Friends and Family: Three times a week    Attends Religious Services: More than 4 times per year    Active Member of Clubs or Organizations: Yes    Attends Banker Meetings: More than 4 times per year    Marital Status: Widowed  Intimate Partner Violence: Not At Risk (10/24/2022)   Humiliation, Afraid, Rape, and Kick questionnaire    Fear of Current or Ex-Partner: No    Emotionally Abused: No    Physically Abused: No    Sexually Abused: No    Physical Exam      Future Appointments  Date Time Provider Department Center  09/17/2023  3:10 PM [facility] None  11/10/2023  2:30 PM Grayce Sessions, NP [facility] None

## 2023-09-10 NOTE — Telephone Encounter (Signed)
 Attempted to call [facility] for setting up a ride for his upcoming PCP appointment however [employer] advised Adam Callahan is no longer apart of their plan and advised he is apart of [employer]'s plan now but his chart is showing [employer].  I will forward to Dede to follow up on same.   Maralyn Sago, EMT-Paramedic [phone] 09/10/2023

## 2023-09-12 ENCOUNTER — Telehealth (HOSPITAL_COMMUNITY): Payer: Self-pay | Admitting: Emergency Medicine

## 2023-09-12 NOTE — Telephone Encounter (Signed)
 Called to follow up on patients correct insurance information in order to look into scheduling transportation for next week visit.  Pt advised that next weeks appointment will be via phone call.    Beatrix Shipper, EMT-Paramedic [phone] 09/12/2023

## 2023-09-17 ENCOUNTER — Other Ambulatory Visit (HOSPITAL_COMMUNITY): Payer: Self-pay | Admitting: Emergency Medicine

## 2023-09-17 ENCOUNTER — Ambulatory Visit (INDEPENDENT_AMBULATORY_CARE_PROVIDER_SITE_OTHER): Payer: 59 | Admitting: *Deleted

## 2023-09-17 ENCOUNTER — Encounter (INDEPENDENT_AMBULATORY_CARE_PROVIDER_SITE_OTHER): Payer: Self-pay

## 2023-09-17 DIAGNOSIS — Z0000 Encounter for general adult medical examination without abnormal findings: Secondary | ICD-10-CM

## 2023-09-17 NOTE — Telephone Encounter (Unsigned)
 Copied from CRM [id number]. Topic: General - Other >> Sep 17, 2023  3:31 PM Archie Patten S wrote: Reason for CRM: Patient is asking for assistance from someone to help with cleaning and getting his medication from the pharmacy or other errands. Callback number is [phone]

## 2023-09-17 NOTE — Progress Notes (Signed)
 Subjective:   Adam Callahan is a 63 y.o. male who presents for Medicare Annual/Subsequent preventive examination.  Visit Complete: Virtual I connected with  [name] on 09/17/23 by a audio enabled telemedicine application and verified that I am speaking with the correct person using two identifiers.  Patient Location: Home  Provider Location: Home Office  I discussed the limitations of evaluation and management by telemedicine. The patient expressed understanding and agreed to proceed.  Vital Signs: Because this visit was a virtual/telehealth visit, some criteria may be missing or patient reported. Any vitals not documented were not able to be obtained and vitals that have been documented are patient reported.    Cardiac Risk Factors include: male gender;diabetes mellitus;advanced age (>44men, >75 women)     Objective:    There were no vitals filed for this visit. There is no height or weight on file to calculate BMI.     09/17/2023    3:09 PM 10/12/2021   12:24 PM 04/22/2021    3:56 PM 03/02/2016    3:46 AM 11/17/2015    4:40 PM 11/17/2015    4:46 AM 03/09/2015   10:10 AM  Advanced Directives  Does Patient Have a Medical Advance Directive? No Yes No;Yes Yes No No No  Type of Chief of Staff of [place];Living will     Does patient want to make changes to medical advance directive?  No - Patient declined  No - Patient declined     Copy of Healthcare Power of Attorney in Chart?    No - copy requested     Would patient like information on creating a medical advance directive? No - Patient declined    No - patient declined information No - patient declined information No - patient declined information    Current Medications (verified) Outpatient Encounter Medications as of 09/17/2023  Medication Sig   amLODipine (NORVASC) 10 MG tablet Take 1 tablet (10 mg total) by mouth daily.   ASPIRIN LOW DOSE 81 MG EC tablet Take  81 mg by mouth daily.   atorvastatin (LIPITOR) 20 MG tablet Take 1 tablet (20 mg total) by mouth daily.   insulin glargine (LANTUS SOLOSTAR) 100 UNIT/ML Solostar Pen Inject 40 Units into the skin daily.   insulin lispro (HUMALOG KWIKPEN) 100 UNIT/ML KwikPen Inject 14 Units into the skin 3 (three) times daily.   Insulin Pen Needle (PEN NEEDLES) 32G X 5 MM MISC Patient to use needles with pen as needed for injection of insulin   triamterene-hydrochlorothiazide (MAXZIDE-25) 37.5-25 MG tablet TAKE 1 TABLET BY MOUTH DAILY   No facility-administered encounter medications on file as of 09/17/2023.    Allergies (verified) Patient has no known allergies.   History: Past Medical History:  Diagnosis Date   Diabetes mellitus    Hypertension    Stroke [facility])    Past Surgical History:  Procedure Laterality Date   TEE WITHOUT CARDIOVERSION N/A 05/03/2014   Procedure: TRANSESOPHAGEAL ECHOCARDIOGRAM (TEE);  Surgeon: Chrystie Nose, MD;  Location: [facility] ENDOSCOPY;  Service: Cardiovascular;  Laterality: N/A;   Family History  Problem Relation Age of Onset   Cervical cancer Mother    Hypertension Sister    Diabetes Mellitus II Maternal Uncle    Colon cancer Maternal Aunt    Social History   Socioeconomic History   Marital status: Divorced    Spouse name: Not on file   Number of children: 2   Years of education:  12   Highest education level: Not on file  Occupational History   Not on file  Tobacco Use   Smoking status: Never   Smokeless tobacco: Never  Vaping Use   Vaping status: Never Used  Substance and Sexual Activity   Alcohol use: No    Alcohol/week: 0.0 standard drinks of alcohol   Drug use: No   Sexual activity: Not Currently  Other Topics Concern   Not on file  Social History Narrative   Patient is single and has 2 children.   Patient is right handed.   Patient has hs education.   Patient drinks diet caffeine free sodas daily.   Social Drivers of Health   Financial  Resource Strain: Medium Risk (09/17/2023)   Overall Financial Resource Strain (CARDIA)    Difficulty of Paying Living Expenses: Somewhat hard  Food Insecurity: Food Insecurity Present (09/17/2023)   Hunger Vital Sign    Worried About Running Out of Food in the Last Year: Sometimes true    Ran Out of Food in the Last Year: Sometimes true  Transportation Needs: Unmet Transportation Needs (09/17/2023)   PRAPARE - Administrator, Civil Service (Medical): Yes    Lack of Transportation (Non-Medical): No  Physical Activity: Inactive (09/17/2023)   Exercise Vital Sign    Days of Exercise per Week: 0 days    Minutes of Exercise per Session: 0 min  Stress: No Stress Concern Present (09/17/2023)   [employer] of Occupational Health - Occupational Stress Questionnaire    Feeling of Stress : Not at all  Social Connections: Moderately Isolated (09/17/2023)   Social Connection and Isolation Panel [NHANES]    Frequency of Communication with Friends and Family: More than three times a week    Frequency of Social Gatherings with Friends and Family: More than three times a week    Attends Religious Services: More than 4 times per year    Active Member of [employer] or Organizations: No    Attends Engineer, structural: Never    Marital Status: Divorced    Tobacco Counseling Counseling given: Not Answered   Clinical Intake:  Pre-visit preparation completed: Yes  Pain : No/denies pain     Diabetes: Yes CBG done?: No Did pt. bring in CBG monitor from home?: No  How often do you need to have someone help you when you read instructions, pamphlets, or other written materials from your doctor or pharmacy?: 1 - Never  Interpreter Needed?: No  Information entered by :: Remi Haggard LPN   Activities of Daily Living    09/17/2023    3:02 PM  In your present state of health, do you have any difficulty performing the following activities:  Hearing? 0  Vision? 0  Difficulty  concentrating or making decisions? 0  Walking or climbing stairs? 1  Dressing or bathing? 0  Doing errands, shopping? 1  Preparing Food and eating ? N  Using the Toilet? N  In the past six months, have you accidently leaked urine? N  Do you have problems with loss of bowel control? N  Managing your Medications? N  Managing your Finances? N  Housekeeping or managing your Housekeeping? N    Patient Care Team: Grayce Sessions, NP as PCP - General (Internal Medicine)  Indicate any recent Medical Services you may have received from other than [facility] providers in the past year (date may be approximate).     Assessment:   This is a routine wellness examination  for [employer].  Hearing/Vision screen Hearing Screening - Comments:: No trouble hearing Vision Screening - Comments:: Not up to date Education provided   Goals Addressed             This Visit's Progress    Patient Stated       Wants to get off some medications       Depression Screen    09/17/2023    3:06 PM 08/12/2023    2:44 PM 03/10/2023    1:53 PM 10/24/2022    5:11 PM 09/10/2022    1:57 PM 06/04/2022    4:10 PM 03/04/2022    1:41 PM  PHQ 2/9 Scores  PHQ - 2 Score 0   2 2 0   PHQ- 9 Score    3 4    Exception Documentation  Patient refusal Medical reason    Patient refusal    Fall Risk    09/17/2023    3:00 PM 03/10/2023    2:21 PM 01/07/2023    3:39 PM 09/10/2022    1:43 PM 07/08/2022    2:02 PM  Fall Risk   Falls in the past year? 1 0 0 0 0  Number falls in past yr: 0 0 0 0 0  Injury with Fall? 0 0 0 0 0  Risk for fall due to : Impaired balance/gait History of fall(s) No Fall Risks No Fall Risks No Fall Risks  Follow up Falls evaluation completed;Education provided;Falls prevention discussed    Falls evaluation completed    MEDICARE RISK AT HOME: Medicare Risk at Home Any stairs in or around the home?: No If so, are there any without handrails?: No Home free of loose throw rugs in walkways, pet  beds, electrical cords, etc?: Yes Adequate lighting in your home to reduce risk of falls?: Yes Life alert?: No Use of a cane, walker or w/c?: Yes Grab bars in the bathroom?: No Shower chair or bench in shower?: Yes Elevated toilet seat or a handicapped toilet?: No  TIMED UP AND GO:  Was the test performed?  No    Cognitive Function:        09/17/2023    3:05 PM 10/12/2021   12:30 PM  6CIT Screen  What Year? 4 points 4 points  What month?  0 points  What time? 0 points 0 points  Count back from 20 0 points 4 points  Months in reverse 4 points 4 points  Repeat phrase 10 points 10 points  Total Score  22 points    Immunizations Immunization History  Administered Date(s) Administered   Influenza,inj,Quad PF,6+ Mos 06/08/2013, 04/30/2014   PFIZER(Purple Top)SARS-COV-2 Vaccination 11/11/2019, 12/06/2019   Pneumococcal Polysaccharide-23 06/08/2013   Tdap 08/16/2022    TDAP status: Due, Education has been provided regarding the importance of this vaccine. Advised may receive this vaccine at local pharmacy or Health Dept. Aware to provide a copy of the vaccination record if obtained from local pharmacy or Health Dept. Verbalized acceptance and understanding.  Flu Vaccine status: Due, Education has been provided regarding the importance of this vaccine. Advised may receive this vaccine at local pharmacy or Health Dept. Aware to provide a copy of the vaccination record if obtained from local pharmacy or Health Dept. Verbalized acceptance and understanding.  Pneumococcal vaccine status: Due, Education has been provided regarding the importance of this vaccine. Advised may receive this vaccine at local pharmacy or Health Dept. Aware to provide a copy of the vaccination record if obtained from local  pharmacy or Health Dept. Verbalized acceptance and understanding.  Covid-19 vaccine status: Information provided on how to obtain vaccines.   Qualifies for Shingles Vaccine? Yes   Zostavax  completed No   Shingrix Completed?: No.    Education has been provided regarding the importance of this vaccine. Patient has been advised to call insurance company to determine out of pocket expense if they have not yet received this vaccine. Advised may also receive vaccine at local pharmacy or Health Dept. Verbalized acceptance and understanding.  Screening Tests Health Maintenance  Topic Date Due   OPHTHALMOLOGY EXAM  Never done   Zoster Vaccines- Shingrix (1 of 2) Never done   Pneumococcal Vaccine 14-58 Years old (2 of 2 - PCV) 06/08/2014   COVID-19 Vaccine (3 - Pfizer risk series) 01/03/2020   FOOT EXAM  07/09/2023   Diabetic kidney evaluation - Urine ACR  09/11/2023   INFLUENZA VACCINE  10/20/2023 (Originally 02/20/2023)   HEMOGLOBIN A1C  11/10/2023   Diabetic kidney evaluation - eGFR measurement  08/11/2024   Medicare Annual Wellness (AWV)  09/16/2024   Fecal DNA (Cologuard)  05/15/2025   Hepatitis C Screening  Completed   HIV Screening  Completed   HPV VACCINES  Aged Out   DTaP/Tdap/Td  Discontinued   Colonoscopy  Discontinued    Health Maintenance  Health Maintenance Due  Topic Date Due   OPHTHALMOLOGY EXAM  Never done   Zoster Vaccines- Shingrix (1 of 2) Never done   Pneumococcal Vaccine 30-33 Years old (2 of 2 - PCV) 06/08/2014   COVID-19 Vaccine (3 - Pfizer risk series) 01/03/2020   FOOT EXAM  07/09/2023   Diabetic kidney evaluation - Urine ACR  09/11/2023    Colorectal cancer screening: Type of screening: Cologuard. Completed 2023. Repeat every 3 years  Lung Cancer Screening: (Low Dose CT Chest recommended if Age 65-80 years, 20 pack-year currently smoking OR have quit w/in 15years.) does not qualify.   Lung Cancer Screening Referral:   Additional Screening:  Hepatitis C Screening: does not qualify; Completed 2016  Vision Screening: Recommended annual ophthalmology exams for early detection of glaucoma and other disorders of the eye. Is the patient up to date  with their annual eye exam?  No  Who is the provider or what is the name of the office in which the patient attends annual eye exams?  If pt is not established with a provider, would they like to be referred to a provider to establish care? No .   Dental Screening: Recommended annual dental exams for proper oral hygiene  Nutrition Risk Assessment:  Has the patient had any N/V/D within the last 2 months?  No  Does the patient have any non-healing wounds?  No  Has the patient had any unintentional weight loss or weight gain?  No   Diabetes:  Is the patient diabetic?  Yes  If diabetic, was a CBG obtained today?  No  Did the patient bring in their glucometer from home?  No  How often do you monitor your CBG's? 1 time a day.   Financial Strains and Diabetes Management:  Are you having any financial strains with the device, your supplies or your medication? No .  Does the patient want to be seen by Chronic Care Management for management of their diabetes?  No  Would the patient like to be referred to a Nutritionist or for Diabetic Management?  No   Diabetic Exams:  Diabetic Eye Exam: .  Pt has been advised about the  importance in completing this exam.   Diabetic Foot Exam: . Pt has been advised about the importance in completing this exam.  Community Resource Referral / Chronic Care Management: CRR required this visit?  No   CCM required this visit?  Appt scheduled with PCP     Plan:     I have personally reviewed and noted the following in the patient's chart:   Medical and social history Use of alcohol, tobacco or illicit drugs  Current medications and supplements including opioid prescriptions. Patient is not currently taking opioid prescriptions. Functional ability and status Nutritional status Physical activity Advanced directives List of other physicians Hospitalizations, surgeries, and ER visits in previous 12 months Vitals Screenings to include cognitive,  depression, and falls Referrals and appointments  In addition, I have reviewed and discussed with patient certain preventive protocols, quality metrics, and best practice recommendations. A written personalized care plan for preventive services as well as general preventive health recommendations were provided to patient.     Remi Haggard, LPN   1/61/0960   After Visit Summary: (MyChart) Due to this being a telephonic visit, the after visit summary with patients personalized plan was offered to patient via MyChart   Nurse Notes:

## 2023-09-17 NOTE — Progress Notes (Unsigned)
 Paramedicine Encounter    [name], male    DOB: 12-15-1960, 63 y.o.   MRN: [id number]   Complaints Food insecurites  Assessment A O  x 4, skin W&D w/ good color. Denies chest pain or SOB.  Lung sounds clear bilat.  Compliance with meds YES  Pill box filled x 1 week  Refills needed NONE  Meds changes since last visit NONE    Social changes NONE   BP (!) 160/0 (BP Location: Left Arm, Patient Position: Sitting, Cuff Size: Normal) Comment: Palpated  Pulse 83   SpO2 98%  Weight yesterday- not taken Last visit weight- n/a needs to get scale  ATF Adam Callahan A&O x 4, skin W&D w/ good color.  Pt denies chest pain or SOB.  Lung sounds clear throughout.  Pill box reconciled x 1 week.   Pt expresses desire to get assistance w/ food.  He inquired about Mom's Meals which I advised him is no longer in existence.  Pt states he has applied for food stamps in the past and did not qualify.  I advised him I would reach out to Robyne Peers regarding assistance w/ food insecurities.   During visit pt advises he needs assistance w/ his Freestyle Libre 2.  I assisted placing a sensor on the outer portion of his upper rt arm.  The reader device has a 60 minute initialization before it can be used for monitoring blood sugars.  I will reach out to him tomorrow to see if everything is working as it should be.   ACTION: Home visit completed  Bethanie Dicker [phone] 09/17/23  Patient Care Team: Grayce Sessions, NP as PCP - General (Internal Medicine)  Patient Active Problem List   Diagnosis Date Noted  . Pain due to onychomycosis of toenails of both feet 08/06/2021  . Diabetic neuropathy (HCC) 08/06/2021  . Bilateral sensorineural hearing loss 03/04/2016  . Cognitive deficit, post-stroke 11/14/2014  . 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 Homonymous hemianopsia following cerebrovascular accident 06/06/2014   . Acute left hemiparesis (HCC) 05/04/2014  . Stroke (HCC) 04/30/2014  . Spastic hemiplegia affecting dominant side (HCC) 10/01/2013  . Aphasia as late effect of cerebrovascular accident 10/01/2013  . 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 Dehydration 06/07/2013  . Hypokalemia 06/07/2013  . Dyslipidemia 06/07/2013  . Cerebral infarction (HCC) 06/06/2013  . Diabetes mellitus type 2, uncontrolled 06/06/2013  . Rhabdomyolysis 06/06/2013    Current Outpatient Medications:  .  amLODipine (NORVASC) 10 MG tablet, Take 1 tablet (10 mg total) by mouth daily., Disp: 90 tablet, Rfl: 1 .  ASPIRIN LOW DOSE 81 MG EC tablet, Take 81 mg by mouth daily., Disp: , Rfl:  .  atorvastatin (LIPITOR) 20 MG tablet, Take 1 tablet (20 mg total) by mouth daily., Disp: 90 tablet, Rfl: 1 .  insulin glargine (LANTUS SOLOSTAR) 100 UNIT/ML Solostar Pen, Inject 40 Units into the skin daily., Disp: 45 mL, Rfl: 2 .  insulin lispro (HUMALOG KWIKPEN) 100 UNIT/ML KwikPen, Inject 14 Units into the skin 3 (three) times daily., Disp: 15 mL, Rfl: 3 .  Insulin Pen Needle (PEN NEEDLES) 32G X 5 MM MISC, Patient to use needles with pen as needed for injection of insulin, Disp: 100 each, Rfl: 2 .  triamterene-hydrochlorothiazide (MAXZIDE-25) 37.5-25 MG tablet, TAKE 1 TABLET BY MOUTH DAILY, Disp: 90 tablet, Rfl: 1 No Known Allergies  Social History   Socioeconomic History  . Marital status: Divorced    Spouse name: Not on file  . Number of children: 2  . Years of education: 75  . Highest education level: Not on file  Occupational History  . Not on file  Tobacco Use  . Smoking status: Never  . Smokeless tobacco: Never  Vaping Use  . Vaping status: Never Used  Substance and Sexual Activity  . Alcohol use: No    Alcohol/week: 0.0 standard drinks of alcohol  . Drug use: No  . Sexual activity: Not Currently  Other Topics Concern   . Not on file  Social History Narrative   Patient is single and has 2 children.   Patient is right handed.   Patient has hs education.   Patient drinks diet caffeine free sodas daily.   Social Drivers of Health   Financial Resource Strain: Medium Risk (09/17/2023)   Overall Financial Resource Strain (CARDIA)   . Difficulty of Paying Living Expenses: Somewhat hard  Food Insecurity: Food Insecurity Present (09/17/2023)   Hunger Vital Sign   . Worried About Programme researcher, broadcasting/film/video in the Last Year: Sometimes true   . Ran Out of Food in the Last Year: Sometimes true  Transportation Needs: Unmet Transportation Needs (09/17/2023)   PRAPARE - Transportation   . Lack of Transportation (Medical): Yes   . Lack of Transportation (Non-Medical): No  Physical Activity: Inactive (09/17/2023)   Exercise Vital Sign   . Days of Exercise per Week: 0 days   . Minutes of Exercise per Session: 0 min  Stress: No Stress Concern Present (09/17/2023)   [employer] of Occupational Health - Occupational Stress Questionnaire   . Feeling of Stress : Not at all  Social Connections: Moderately Isolated (09/17/2023)   Social Connection and Isolation Panel [NHANES]   . Frequency of Communication with Friends and Family: More than three times a week   . Frequency of Social Gatherings with Friends and Family: More than three times a week   . Attends Religious Services: More than 4 times per year   . Active Member of Clubs or Organizations: No   . Attends Banker Meetings: Never   . Marital Status: Divorced  Catering manager Violence: Not At Risk (09/17/2023)   Humiliation, Afraid, Rape, and Kick questionnaire   . Fear of Current or Ex-Partner: No   . Emotionally Abused: No   . Physically Abused: No   . Sexually Abused: No    Physical Exam      Future Appointments  Date Time Provider Department Center  11/10/2023  2:30 PM Grayce Sessions, NP [facility] None

## 2023-09-17 NOTE — Patient Instructions (Signed)
 Adam Callahan , Thank you for taking time to come for your Medicare Wellness Visit. I appreciate your ongoing commitment to your health goals. Please review the following plan we discussed and let me know if I can assist you in the future.   Screening recommendations/referrals: Colonoscopy:  Recommended yearly ophthalmology/optometry visit for glaucoma screening and checkup Recommended yearly dental visit for hygiene and checkup  Vaccinations: Influenza vaccine:  Pneumococcal vaccine:  Tdap vaccine:  Shingles vaccine:     Preventive Care 40-64 Years, Male Preventive care refers to lifestyle choices and visits with your health care provider that can promote health and wellness. What does preventive care include? A yearly physical exam. This is also called an annual well check. Dental exams once or twice a year. Routine eye exams. Ask your health care provider how often you should have your eyes checked. Personal lifestyle choices, including: Daily care of your teeth and gums. Regular physical activity. Eating a healthy diet. Avoiding tobacco and drug use. Limiting alcohol use. Practicing safe sex. Taking low-dose aspirin every day starting at age 44. What happens during an annual well check? The services and screenings done by your health care provider during your annual well check will depend on your age, overall health, lifestyle risk factors, and family history of disease. Counseling  Your health care provider may ask you questions about your: Alcohol use. Tobacco use. Drug use. Emotional well-being. Home and relationship well-being. Sexual activity. Eating habits. Work and work Astronomer. Screening  You may have the following tests or measurements: Height, weight, and BMI. Blood pressure. Lipid and cholesterol levels. These may be checked every 5 years, or more frequently if you are over 34 years old. Skin check. Lung cancer screening. You may have this screening  every year starting at age 55 if you have a 30-pack-year history of smoking and currently smoke or have quit within the past 15 years. Fecal occult blood test (FOBT) of the stool. You may have this test every year starting at age 34. Flexible sigmoidoscopy or colonoscopy. You may have a sigmoidoscopy every 5 years or a colonoscopy every 10 years starting at age 50. Prostate cancer screening. Recommendations will vary depending on your family history and other risks. Hepatitis C blood test. Hepatitis B blood test. Sexually transmitted disease (STD) testing. Diabetes screening. This is done by checking your blood sugar (glucose) after you have not eaten for a while (fasting). You may have this done every 1-3 years. Discuss your test results, treatment options, and if necessary, the need for more tests with your health care provider. Vaccines  Your health care provider may recommend certain vaccines, such as: Influenza vaccine. This is recommended every year. Tetanus, diphtheria, and acellular pertussis (Tdap, Td) vaccine. You may need a Td booster every 10 years. Zoster vaccine. You may need this after age 64. Pneumococcal 13-valent conjugate (PCV13) vaccine. You may need this if you have certain conditions and have not been vaccinated. Pneumococcal polysaccharide (PPSV23) vaccine. You may need one or two doses if you smoke cigarettes or if you have certain conditions. Talk to your health care provider about which screenings and vaccines you need and how often you need them. This information is not intended to replace advice given to you by your health care provider. Make sure you discuss any questions you have with your health care provider. Document Released: 08/04/2015 Document Revised: 03/27/2016 Document Reviewed: 05/09/2015 Elsevier Interactive Patient Education  2017 [employer].  Fall Prevention in the Home Falls can  cause injuries. They can happen to people of all ages. There are many  things you can do to make your home safe and to help prevent falls. What can I do on the outside of my home? Regularly fix the edges of walkways and driveways and fix any cracks. Remove anything that might make you trip as you walk through a door, such as a raised step or threshold. Trim any bushes or trees on the path to your home. Use bright outdoor lighting. Clear any walking paths of anything that might make someone trip, such as rocks or tools. Regularly check to see if handrails are loose or broken. Make sure that both sides of any steps have handrails. Any raised decks and porches should have guardrails on the edges. Have any leaves, snow, or ice cleared regularly. Use sand or salt on walking paths during winter. Clean up any spills in your garage right away. This includes oil or grease spills. What can I do in the bathroom? Use night lights. Install grab bars by the toilet and in the tub and shower. Do not use towel bars as grab bars. Use non-skid mats or decals in the tub or shower. If you need to sit down in the shower, use a plastic, non-slip stool. Keep the floor dry. Clean up any water that spills on the floor as soon as it happens. Remove soap buildup in the tub or shower regularly. Attach bath mats securely with double-sided non-slip rug tape. Do not have throw rugs and other things on the floor that can make you trip. What can I do in the bedroom? Use night lights. Make sure that you have a light by your bed that is easy to reach. Do not use any sheets or blankets that are too big for your bed. They should not hang down onto the floor. Have a firm chair that has side arms. You can use this for support while you get dressed. Do not have throw rugs and other things on the floor that can make you trip. What can I do in the kitchen? Clean up any spills right away. Avoid walking on wet floors. Keep items that you use a lot in easy-to-reach places. If you need to reach  something above you, use a strong step stool that has a grab bar. Keep electrical cords out of the way. Do not use floor polish or wax that makes floors slippery. If you must use wax, use non-skid floor wax. Do not have throw rugs and other things on the floor that can make you trip. What can I do with my stairs? Do not leave any items on the stairs. Make sure that there are handrails on both sides of the stairs and use them. Fix handrails that are broken or loose. Make sure that handrails are as long as the stairways. Check any carpeting to make sure that it is firmly attached to the stairs. Fix any carpet that is loose or worn. Avoid having throw rugs at the top or bottom of the stairs. If you do have throw rugs, attach them to the floor with carpet tape. Make sure that you have a light switch at the top of the stairs and the bottom of the stairs. If you do not have them, ask someone to add them for you. What else can I do to help prevent falls? Wear shoes that: Do not have high heels. Have rubber bottoms. Are comfortable and fit you well. Are closed at the toe.  Do not wear sandals. If you use a stepladder: Make sure that it is fully opened. Do not climb a closed stepladder. Make sure that both sides of the stepladder are locked into place. Ask someone to hold it for you, if possible. Clearly mark and make sure that you can see: Any grab bars or handrails. First and last steps. Where the edge of each step is. Use tools that help you move around (mobility aids) if they are needed. These include: Canes. Walkers. Scooters. Crutches. Turn on the lights when you go into a dark area. Replace any light bulbs as soon as they burn out. Set up your furniture so you have a clear path. Avoid moving your furniture around. If any of your floors are uneven, fix them. If there are any pets around you, be aware of where they are. Review your medicines with your doctor. Some medicines can make you  feel dizzy. This can increase your chance of falling. Ask your doctor what other things that you can do to help prevent falls. This information is not intended to replace advice given to you by your health care provider. Make sure you discuss any questions you have with your health care provider. Document Released: 05/04/2009 Document Revised: 12/14/2015 Document Reviewed: 08/12/2014 Elsevier Interactive Patient Education  2017 [employer].

## 2023-09-24 ENCOUNTER — Other Ambulatory Visit (HOSPITAL_COMMUNITY): Payer: Self-pay | Admitting: Emergency Medicine

## 2023-09-24 ENCOUNTER — Telehealth: Payer: Self-pay

## 2023-09-24 DIAGNOSIS — E1169 Type 2 diabetes mellitus with other specified complication: Secondary | ICD-10-CM

## 2023-09-24 NOTE — Telephone Encounter (Signed)
 Message received from Beatrix Shipper, EMT/[facility] stating the patient is interested in food resources.  Per Dede, he has been denied food stamps.   I called the patient and his voicemail is full.    I want to inquire when he was denied food stamps and if he ever received them in the past.  I also want to provide him with information about [employer] and refer him to [facility] if he is agreeable.  He also may be interested in a referral to [facility]

## 2023-09-24 NOTE — Progress Notes (Unsigned)
 Paramedicine Encounter    [name], male    DOB: [birth year], 63 y.o.   MRN: [id number]   Complaints NONE  Assessment A&O x 4 skin W&D w/ good color.  Denies chest pain or SOB.  Lung sounds clear throughout and no peripheral edema noted  Compliance with meds YES  Pill box filled  x 2 weeks  Refills needed NONE  Meds changes since last visit NONE    Social changes NONE   BP 120/70 (BP Location: Left Arm, Patient Position: Sitting, Cuff Size: Normal)   Pulse 76   Resp 14   SpO2 97%  Weight yesterday-  Last visit weight-  Cbg 135  ACTION: Home visit completed  Bethanie Dicker [phone] 09/24/23  Patient Care Team: Grayce Sessions, NP as PCP - General (Internal Medicine)  Patient Active Problem List   Diagnosis Date Noted  . Pain due to onychomycosis of toenails of both feet 08/06/2021  . Diabetic neuropathy (HCC) 08/06/2021  . Bilateral sensorineural hearing loss 03/04/2016  . Cognitive deficit, post-stroke 11/14/2014  . 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 Homonymous hemianopsia following cerebrovascular accident 06/06/2014  . Acute left hemiparesis (HCC) 05/04/2014  . Stroke (HCC) 04/30/2014  . Spastic hemiplegia affecting dominant side (HCC) 10/01/2013  . Aphasia as late effect of cerebrovascular accident 10/01/2013  . 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 Dehydration 06/07/2013  . Hypokalemia 06/07/2013  . Dyslipidemia 06/07/2013  . Cerebral infarction (HCC) 06/06/2013  . Diabetes mellitus type 2, uncontrolled 06/06/2013  . Rhabdomyolysis 06/06/2013    Current Outpatient Medications:  .  amLODipine (NORVASC) 10 MG tablet, Take 1 tablet (10 mg total) by mouth daily., Disp: 90 tablet, Rfl: 1 .  ASPIRIN LOW DOSE 81 MG EC tablet, Take 81 mg by mouth daily., Disp: , Rfl:  .   atorvastatin (LIPITOR) 20 MG tablet, Take 1 tablet (20 mg total) by mouth daily., Disp: 90 tablet, Rfl: 1 .  insulin glargine (LANTUS SOLOSTAR) 100 UNIT/ML Solostar Pen, Inject 40 Units into the skin daily., Disp: 45 mL, Rfl: 2 .  insulin lispro (HUMALOG KWIKPEN) 100 UNIT/ML KwikPen, Inject 14 Units into the skin 3 (three) times daily., Disp: 15 mL, Rfl: 3 .  Insulin Pen Needle (PEN NEEDLES) 32G X 5 MM MISC, Patient to use needles with pen as needed for injection of insulin, Disp: 100 each, Rfl: 2 .  triamterene-hydrochlorothiazide (MAXZIDE-25) 37.5-25 MG tablet, TAKE 1 TABLET BY MOUTH DAILY, Disp: 90 tablet, Rfl: 1 No Known Allergies   Social History   Socioeconomic History  . Marital status: Divorced    Spouse name: Not on file  . Number of children: 2  . Years of education: 73  . Highest education level: Not on file  Occupational History  . Not on file  Tobacco Use  . Smoking status: Never  . Smokeless tobacco: Never  Vaping Use  . Vaping status: Never Used  Substance and Sexual Activity  . Alcohol use: No    Alcohol/week: 0.0 standard drinks of alcohol  . Drug use: No  . Sexual activity: Not Currently  Other Topics Concern  . Not on file  Social History Narrative   Patient is single and has 2 children.   Patient is right handed.   Patient has hs education.   Patient drinks diet caffeine free sodas daily.   Social Drivers of Health  Financial Resource Strain: Medium Risk (09/17/2023)   Overall Financial Resource Strain (CARDIA)   . Difficulty of Paying Living Expenses: Somewhat hard  Food Insecurity: Food Insecurity Present (09/17/2023)   Hunger Vital Sign   . Worried About Programme researcher, broadcasting/film/video in the Last Year: Sometimes true   . Ran Out of Food in the Last Year: Sometimes true  Transportation Needs: Unmet Transportation Needs (09/17/2023)   PRAPARE - Transportation   . Lack of Transportation (Medical): Yes   . Lack of Transportation (Non-Medical): No  Physical  Activity: Inactive (09/17/2023)   Exercise Vital Sign   . Days of Exercise per Week: 0 days   . Minutes of Exercise per Session: 0 min  Stress: No Stress Concern Present (09/17/2023)   [employer] of Occupational Health - Occupational Stress Questionnaire   . Feeling of Stress : Not at all  Social Connections: Moderately Isolated (09/17/2023)   Social Connection and Isolation Panel [NHANES]   . Frequency of Communication with Friends and Family: More than three times a week   . Frequency of Social Gatherings with Friends and Family: More than three times a week   . Attends Religious Services: More than 4 times per year   . Active Member of Clubs or Organizations: No   . Attends Banker Meetings: Never   . Marital Status: Divorced  Catering manager Violence: Not At Risk (09/17/2023)   Humiliation, Afraid, Rape, and Kick questionnaire   . Fear of Current or Ex-Partner: No   . Emotionally Abused: No   . Physically Abused: No   . Sexually Abused: No    Physical Exam      Future Appointments  Date Time Provider Department Center  11/10/2023  2:30 PM Grayce Sessions, NP [facility] None

## 2023-10-01 ENCOUNTER — Other Ambulatory Visit (HOSPITAL_COMMUNITY): Payer: Self-pay | Admitting: Emergency Medicine

## 2023-10-01 NOTE — Progress Notes (Unsigned)
 Paramedicine Encounter    [name], male    DOB: [birth year], 63 y.o.   MRN: [id number]   Complaints NONE  Assessment A&O x 4, skin W&D w/ good color.  Denies chest pain or SOB.  Lung sounds clear and equal bilat and no peripheral edema noted.   Compliance with meds YES  Pill box filled 2 weeks  Refills needed NONE  Meds changes since last visit NONE    Social changes NONE   BP (!) 140/80 (BP Location: Left Arm, Patient Position: Sitting, Cuff Size: Normal)   Pulse 90   Resp 16   SpO2 96%  Weight yesterday- not taken Last visit weight- not taken  Changed sensor.  Next appt 3/19 @12   ACTION: Home visit completed   Bethanie Dicker [phone] 10/01/23  Patient Care Team: Grayce Sessions, NP as PCP - General (Internal Medicine)  Patient Active Problem List   Diagnosis Date Noted  . Pain due to onychomycosis of toenails of both feet 08/06/2021  . Diabetic neuropathy (HCC) 08/06/2021  . Bilateral sensorineural hearing loss 03/04/2016  . Cognitive deficit, post-stroke 11/14/2014  . 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 Homonymous hemianopsia following cerebrovascular accident 06/06/2014  . Acute left hemiparesis (HCC) 05/04/2014  . Stroke (HCC) 04/30/2014  . Spastic hemiplegia affecting dominant side (HCC) 10/01/2013  . Aphasia as late effect of cerebrovascular accident 10/01/2013  . 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 Dehydration 06/07/2013  . Hypokalemia 06/07/2013  . Dyslipidemia 06/07/2013  . Cerebral infarction (HCC) 06/06/2013  . Diabetes mellitus type 2, uncontrolled 06/06/2013  . Rhabdomyolysis 06/06/2013    Current Outpatient Medications:  .  amLODipine (NORVASC) 10 MG tablet, Take 1 tablet (10 mg total) by mouth daily., Disp: 90 tablet, Rfl: 1 .  ASPIRIN LOW DOSE 81 MG  EC tablet, Take 81 mg by mouth daily., Disp: , Rfl:  .  atorvastatin (LIPITOR) 20 MG tablet, Take 1 tablet (20 mg total) by mouth daily., Disp: 90 tablet, Rfl: 1 .  insulin glargine (LANTUS SOLOSTAR) 100 UNIT/ML Solostar Pen, Inject 40 Units into the skin daily., Disp: 45 mL, Rfl: 2 .  insulin lispro (HUMALOG KWIKPEN) 100 UNIT/ML KwikPen, Inject 14 Units into the skin 3 (three) times daily., Disp: 15 mL, Rfl: 3 .  Insulin Pen Needle (PEN NEEDLES) 32G X 5 MM MISC, Patient to use needles with pen as needed for injection of insulin, Disp: 100 each, Rfl: 2 .  triamterene-hydrochlorothiazide (MAXZIDE-25) 37.5-25 MG tablet, TAKE 1 TABLET BY MOUTH DAILY, Disp: 90 tablet, Rfl: 1 No Known Allergies   Social History   Socioeconomic History  . Marital status: Divorced    Spouse name: Not on file  . Number of children: 2  . Years of education: 75  . Highest education level: Not on file  Occupational History  . Not on file  Tobacco Use  . Smoking status: Never  . Smokeless tobacco: Never  Vaping Use  . Vaping status: Never Used  Substance and Sexual Activity  . Alcohol use: No    Alcohol/week: 0.0 standard drinks of alcohol  . Drug use: No  . Sexual activity: Not Currently  Other Topics Concern  . Not on file  Social History Narrative   Patient is single and has 2 children.   Patient is right handed.   Patient has hs education.   Patient drinks diet  caffeine free sodas daily.   Social Drivers of Health   Financial Resource Strain: Medium Risk (09/17/2023)   Overall Financial Resource Strain (CARDIA)   . Difficulty of Paying Living Expenses: Somewhat hard  Food Insecurity: Food Insecurity Present (09/17/2023)   Hunger Vital Sign   . Worried About Programme researcher, broadcasting/film/video in the Last Year: Sometimes true   . Ran Out of Food in the Last Year: Sometimes true  Transportation Needs: Unmet Transportation Needs (09/17/2023)   PRAPARE - Transportation   . Lack of Transportation (Medical): Yes   .  Lack of Transportation (Non-Medical): No  Physical Activity: Inactive (09/17/2023)   Exercise Vital Sign   . Days of Exercise per Week: 0 days   . Minutes of Exercise per Session: 0 min  Stress: No Stress Concern Present (09/17/2023)   [employer] of Occupational Health - Occupational Stress Questionnaire   . Feeling of Stress : Not at all  Social Connections: Moderately Isolated (09/17/2023)   Social Connection and Isolation Panel [NHANES]   . Frequency of Communication with Friends and Family: More than three times a week   . Frequency of Social Gatherings with Friends and Family: More than three times a week   . Attends Religious Services: More than 4 times per year   . Active Member of Clubs or Organizations: No   . Attends Banker Meetings: Never   . Marital Status: Divorced  Catering manager Violence: Not At Risk (09/17/2023)   Humiliation, Afraid, Rape, and Kick questionnaire   . Fear of Current or Ex-Partner: No   . Emotionally Abused: No   . Physically Abused: No   . Sexually Abused: No    Physical Exam      Future Appointments  Date Time Provider Department Center  11/10/2023  2:30 PM Grayce Sessions, NP [facility] None

## 2023-10-01 NOTE — Telephone Encounter (Signed)
 I called the patient again today and the voicemail was full.

## 2023-10-08 ENCOUNTER — Other Ambulatory Visit (HOSPITAL_COMMUNITY): Payer: Self-pay | Admitting: Emergency Medicine

## 2023-10-08 NOTE — Progress Notes (Signed)
 Paramedicine Encounter    [name], male    DOB: 1961-06-22, 63 y.o.   MRN: [id number]   Complaints NONE  Assessment A&O x 4, skin W&D w/ good color.  Denies chest pain or SOB.  Lung sounds clear and equal throughout.  No edema noted.  Pt is hypertensive today.  He states that he has a lot on his mind  Compliance with meds YES  Pill box filled x 2 weeks  Refills needed NONE  Meds changes since last visit NONE    Social changes NONE   BP (!) 160/90 (BP Location: Left Arm, Patient Position: Sitting, Cuff Size: Normal)   Pulse 68   Resp 16   SpO2 98%  CBG 202 Weight yesterday-not taken Last visit weight- not taken  ATF Adam Callahan A&O x 4, sitting on on sofa.  He was not his jovial self and when I asked him what's wrong he stated, "I just have a lot on my mind."  He did not wish to elaborate.  He has been compliant w/ his meds.  He denies chest pain or SOB.  Lung sounds are clear throughout.  No peripheral edema noted. He denies headache or visual disturbances.   Med box reconciled for 2 weeks. Next home visit will be 3/26 @ 12:00.   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) 07/08/2014   Homonymous hemianopsia following cerebrovascular accident 06/06/2014   Acute left hemiparesis (HCC) 05/04/2014   Stroke (HCC) 04/30/2014   Spastic hemiplegia affecting dominant side (HCC) 10/01/2013   Aphasia as late effect of cerebrovascular accident 10/01/2013   Alterations of sensations, late effect of cerebrovascular  disease(438.6) 10/01/2013   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   Dehydration 06/07/2013   Hypokalemia 06/07/2013   Dyslipidemia 06/07/2013   Cerebral infarction (HCC) 06/06/2013   Diabetes mellitus type 2, uncontrolled 06/06/2013   Rhabdomyolysis 06/06/2013    Current Outpatient Medications:    amLODipine (NORVASC) 10 MG tablet, Take 1 tablet (10 mg total) by mouth daily., Disp: 90 tablet, Rfl: 1   ASPIRIN LOW DOSE 81 MG EC tablet, Take 81 mg by mouth daily., Disp: , Rfl:    atorvastatin (LIPITOR) 20 MG tablet, Take 1 tablet (20 mg total) by mouth daily., Disp: 90 tablet, Rfl: 1   insulin glargine (LANTUS SOLOSTAR) 100 UNIT/ML Solostar Pen, Inject 40 Units into the skin daily., Disp: 45 mL, Rfl: 2   insulin lispro (HUMALOG KWIKPEN) 100 UNIT/ML KwikPen, Inject 14 Units into the skin 3 (three) times daily., Disp: 15 mL, Rfl: 3   Insulin Pen Needle (PEN NEEDLES) 32G X 5 MM MISC, Patient to use needles with pen as needed for injection of insulin, Disp: 100 each, Rfl: 2   triamterene-hydrochlorothiazide (MAXZIDE-25) 37.5-25 MG tablet, TAKE 1 TABLET BY MOUTH DAILY, Disp: 90 tablet, Rfl: 1 No Known Allergies   Social History   Socioeconomic History   Marital status: Divorced    Spouse name: Not on file   Number of children: 2   Years of education: 12   Highest education level: Not on file  Occupational History   Not on file  Tobacco Use   Smoking status: Never   Smokeless tobacco: Never  Vaping Use   Vaping status: Never Used  Substance and Sexual Activity   Alcohol use: No    Alcohol/week: 0.0 standard drinks of alcohol   Drug use: No   Sexual activity: Not Currently  Other Topics Concern   Not on file  Social History Narrative   Patient is single and has 2 children.   Patient is right handed.   Patient has hs education.   Patient drinks diet caffeine free sodas daily.   Social Drivers of Health   Financial Resource Strain: Medium Risk (09/17/2023)   Overall  Financial Resource Strain (CARDIA)    Difficulty of Paying Living Expenses: Somewhat hard  Food Insecurity: Food Insecurity Present (09/17/2023)   Hunger Vital Sign    Worried About Running Out of Food in the Last Year: Sometimes true    Ran Out of Food in the Last Year: Sometimes true  Transportation Needs: Unmet Transportation Needs (09/17/2023)   PRAPARE - Administrator, Civil Service (Medical): Yes    Lack of Transportation (Non-Medical): No  Physical Activity: Inactive (09/17/2023)   Exercise Vital Sign    Days of Exercise per Week: 0 days    Minutes of Exercise per Session: 0 min  Stress: No Stress Concern Present (09/17/2023)   [employer] of Occupational Health - Occupational Stress Questionnaire    Feeling of Stress : Not at all  Social Connections: Moderately Isolated (09/17/2023)   Social Connection and Isolation Panel [NHANES]    Frequency of Communication with Friends and Family: More than three times a week    Frequency of Social Gatherings with Friends and Family: More than three times a week    Attends Religious Services: More than 4 times per year    Active Member of [employer] or Organizations: No    Attends Banker Meetings: Never    Marital Status: Divorced  Catering manager Violence: Not At Risk (09/17/2023)   Humiliation, Afraid, Rape, and Kick questionnaire    Fear of Current or Ex-Partner: No    Emotionally Abused: No    Physically Abused: No    Sexually Abused: No    Physical Exam      Future Appointments  Date Time Provider Department Center  11/10/2023  2:30 PM Grayce Sessions, NP [facility] None

## 2023-10-15 ENCOUNTER — Other Ambulatory Visit (HOSPITAL_COMMUNITY): Payer: Self-pay | Admitting: Emergency Medicine

## 2023-10-15 NOTE — Progress Notes (Signed)
 Paramedicine Encounter    [name], male    DOB: [birth year], 63 y.o.   MRN: [id number]   Complaints NONE  Assessment A&O x 4, skin W&D w/ good color.  Denies chest pain or SOB.  Lung sounds clear and equal bilat.    Compliance with meds Not yet taken today's dose  Pill box filled x 1 week  Refills needed NONE  Meds changes since last visit NONE    Social changes NONE   BP (!) 160/80 (BP Location: Left Arm, Patient Position: Sitting, Cuff Size: Normal)   Pulse 85   Resp 16   SpO2 98%  CBG 115 Weight yesterday- Not taken  Last visit weight-not taken  ATF pt. A&O x 4, skin W&D w/ good color. Pt denies chest pain or SOB.  Lung sounds clear and equal throughout.  No edema noted. Med box reconciled x 1 week.    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) 07/08/2014   Homonymous hemianopsia following cerebrovascular accident 06/06/2014   Acute left hemiparesis (HCC) 05/04/2014   Stroke (HCC) 04/30/2014   Spastic hemiplegia affecting dominant side (HCC) 10/01/2013   Aphasia as late effect of cerebrovascular accident 10/01/2013   Alterations of sensations, late effect of cerebrovascular disease(438.6) 10/01/2013   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   Dehydration 06/07/2013   Hypokalemia 06/07/2013   Dyslipidemia 06/07/2013   Cerebral infarction (HCC) 06/06/2013   Diabetes mellitus type 2, uncontrolled 06/06/2013   Rhabdomyolysis 06/06/2013    Current Outpatient Medications:    amLODipine (NORVASC) 10 MG  tablet, Take 1 tablet (10 mg total) by mouth daily., Disp: 90 tablet, Rfl: 1   ASPIRIN LOW DOSE 81 MG EC tablet, Take 81 mg by mouth daily., Disp: , Rfl:    atorvastatin (LIPITOR) 20 MG tablet, Take 1 tablet (20 mg total) by mouth daily., Disp: 90 tablet, Rfl: 1   insulin glargine (LANTUS SOLOSTAR) 100 UNIT/ML Solostar Pen, Inject 40 Units into the skin daily., Disp: 45 mL, Rfl: 2   insulin lispro (HUMALOG KWIKPEN) 100 UNIT/ML KwikPen, Inject 14 Units into the skin 3 (three) times daily., Disp: 15 mL, Rfl: 3   Insulin Pen Needle (PEN NEEDLES) 32G X 5 MM MISC, Patient to use needles with pen as needed for injection of insulin, Disp: 100 each, Rfl: 2   triamterene-hydrochlorothiazide (MAXZIDE-25) 37.5-25 MG tablet, TAKE 1 TABLET BY MOUTH DAILY, Disp: 90 tablet, Rfl: 1 No Known Allergies   Social History   Socioeconomic History   Marital status: Divorced    Spouse name: Not on file   Number of children: 2   Years of education: 12   Highest education level: Not on file  Occupational History   Not on file  Tobacco Use   Smoking status: Never   Smokeless tobacco: Never  Vaping Use   Vaping status: Never Used  Substance and Sexual Activity   Alcohol use: No    Alcohol/week: 0.0 standard drinks of alcohol   Drug use: No   Sexual activity: Not Currently  Other Topics Concern   Not on file  Social History Narrative   Patient is single and has 2 children.   Patient is right handed.   Patient has hs education.   Patient drinks diet caffeine free sodas daily.   Social Drivers of Health   Financial Resource Strain: Medium Risk (09/17/2023)   Overall Financial Resource Strain (CARDIA)    Difficulty of Paying Living Expenses: Somewhat hard  Food Insecurity: Food Insecurity Present (09/17/2023)   Hunger Vital Sign    Worried About Running Out of Food in the Last Year: Sometimes true    Ran Out of Food in the Last Year: Sometimes true  Transportation Needs: Unmet Transportation Needs  (09/17/2023)   PRAPARE - Administrator, Civil Service (Medical): Yes    Lack of Transportation (Non-Medical): No  Physical Activity: Inactive (09/17/2023)   Exercise Vital Sign    Days of Exercise per Week: 0 days    Minutes of Exercise per Session: 0 min  Stress: No Stress Concern Present (09/17/2023)   [employer] of Occupational Health - Occupational Stress Questionnaire    Feeling of Stress : Not at all  Social Connections: Moderately Isolated (09/17/2023)   Social Connection and Isolation Panel [NHANES]    Frequency of Communication with Friends and Family: More than three times a week    Frequency of Social Gatherings with Friends and Family: More than three times a week    Attends Religious Services: More than 4 times per year    Active Member of [employer] or Organizations: No    Attends Banker Meetings: Never    Marital Status: Divorced  Catering manager Violence: Not At Risk (09/17/2023)   Humiliation, Afraid, Rape, and Kick questionnaire    Fear of Current or Ex-Partner: No    Emotionally Abused: No    Physically Abused: No    Sexually Abused: No    Physical Exam      Future Appointments  Date Time Provider Department Center  11/10/2023  2:30 PM Grayce Sessions, NP [facility] None

## 2023-10-20 NOTE — Telephone Encounter (Signed)
 I called the patient again and the voicemail is still full.

## 2023-10-22 ENCOUNTER — Other Ambulatory Visit (HOSPITAL_COMMUNITY): Payer: Self-pay | Admitting: Emergency Medicine

## 2023-10-22 NOTE — Progress Notes (Signed)
 Paramedicine Encounter    [name], male    DOB: [birth year], 63 y.o.   MRN: [id number]   Complaints NONE  Assessment A&O x 4, skin W&D w/ good color. Denies chest pain or SOB and no peripheral edema noted.  Compliance with meds YES  Pill box filled x 2 weeks  Refills needed NONE  Meds changes since last visit NONE    Social changes NONE   BP 130/80 (BP Location: Left Arm, Patient Position: Sitting, Cuff Size: Normal)   Pulse 80   Resp 16   SpO2 98%  CBG278 Weight yesterday- not taken Last visit weight-  During today's visit, called Robyne Peers so that she could speak with him about getting assistance w/ food resources.  She is going to refer him to [employer] for once-a-month grocery delivery.  Also providing him with [employer].  I will assist him with in accessing these resources.  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) 07/08/2014   Homonymous hemianopsia following cerebrovascular accident 06/06/2014   Acute left hemiparesis (HCC) 05/04/2014   Stroke (HCC) 04/30/2014   Spastic hemiplegia affecting dominant side (HCC) 10/01/2013   Aphasia as late effect of cerebrovascular accident 10/01/2013   Alterations of sensations, late effect of cerebrovascular disease(438.6) 10/01/2013   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   Dehydration 06/07/2013   Hypokalemia 06/07/2013   Dyslipidemia 06/07/2013   Cerebral infarction (HCC) 06/06/2013   Diabetes mellitus type 2,  uncontrolled 06/06/2013   Rhabdomyolysis 06/06/2013    Current Outpatient Medications:    amLODipine (NORVASC) 10 MG tablet, Take 1 tablet (10 mg total) by mouth daily., Disp: 90 tablet, Rfl: 1   ASPIRIN LOW DOSE 81 MG EC tablet, Take 81 mg by mouth daily., Disp: , Rfl:    atorvastatin (LIPITOR) 20 MG tablet, Take 1 tablet (20 mg total) by mouth daily., Disp: 90 tablet, Rfl: 1   insulin glargine (LANTUS SOLOSTAR) 100 UNIT/ML Solostar Pen, Inject 40 Units into the skin daily., Disp: 45 mL, Rfl: 2   insulin lispro (HUMALOG KWIKPEN) 100 UNIT/ML KwikPen, Inject 14 Units into the skin 3 (three) times daily., Disp: 15 mL, Rfl: 3   Insulin Pen Needle (PEN NEEDLES) 32G X 5 MM MISC, Patient to use needles with pen as needed for injection of insulin, Disp: 100 each, Rfl: 2   triamterene-hydrochlorothiazide (MAXZIDE-25) 37.5-25 MG tablet, TAKE 1 TABLET BY MOUTH DAILY, Disp: 90 tablet, Rfl: 1 No Known Allergies   Social History   Socioeconomic History   Marital status: Divorced    Spouse name: Not on file   Number of children: 2   Years of education: 12   Highest education level: Not on file  Occupational History   Not on file  Tobacco Use   Smoking status: Never   Smokeless tobacco: Never  Vaping Use   Vaping status: Never Used  Substance and Sexual Activity   Alcohol use: No    Alcohol/week: 0.0 standard drinks of alcohol   Drug use: No   Sexual activity: Not Currently  Other  Topics Concern   Not on file  Social History Narrative   Patient is single and has 2 children.   Patient is right handed.   Patient has hs education.   Patient drinks diet caffeine free sodas daily.   Social Drivers of Health   Financial Resource Strain: Medium Risk (09/17/2023)   Overall Financial Resource Strain (CARDIA)    Difficulty of Paying Living Expenses: Somewhat hard  Food Insecurity: Food Insecurity Present (09/17/2023)   Hunger Vital Sign    Worried About Running Out of Food in the Last Year:  Sometimes true    Ran Out of Food in the Last Year: Sometimes true  Transportation Needs: Unmet Transportation Needs (09/17/2023)   PRAPARE - Administrator, Civil Service (Medical): Yes    Lack of Transportation (Non-Medical): No  Physical Activity: Inactive (09/17/2023)   Exercise Vital Sign    Days of Exercise per Week: 0 days    Minutes of Exercise per Session: 0 min  Stress: No Stress Concern Present (09/17/2023)   [employer] of Occupational Health - Occupational Stress Questionnaire    Feeling of Stress : Not at all  Social Connections: Moderately Isolated (09/17/2023)   Social Connection and Isolation Panel [NHANES]    Frequency of Communication with Friends and Family: More than three times a week    Frequency of Social Gatherings with Friends and Family: More than three times a week    Attends Religious Services: More than 4 times per year    Active Member of [employer] or Organizations: No    Attends Banker Meetings: Never    Marital Status: Divorced  Catering manager Violence: Not At Risk (09/17/2023)   Humiliation, Afraid, Rape, and Kick questionnaire    Fear of Current or Ex-Partner: No    Emotionally Abused: No    Physically Abused: No    Sexually Abused: No    Physical Exam      Future Appointments  Date Time Provider Department Center  11/10/2023  2:30 PM Grayce Sessions, NP [facility] None

## 2023-10-22 NOTE — Telephone Encounter (Signed)
 I received a call from Beatrix Shipper, EMT / community paramedic who was with the patient.  We were able to address his request for assistance with obtaining food.   I sent Dede a copy of the [employer] QR code and asked that she help the patient put this resource on his phone.    I also explained that I can refer him to [facility] for additional resources and he was in agreement and that referral was placed.  He said he has been denied food stamps but was not sure how long ago that was and he was in agreement to placing a referral to [employer] [facility] [place] for possible assistance with re-applying.  I told him that there is no guarantee that they can assist but we can try and that referral has been placed.  I also explained to him that I can refer him to [employer] for some grocery delivery.  He was in agreement to that too and that referral was placed and sent to Darl Pikes [facility]/ [employer].

## 2023-10-23 ENCOUNTER — Telehealth: Payer: Self-pay | Admitting: *Deleted

## 2023-10-23 NOTE — Progress Notes (Signed)
 Complex Care Management Note  Care Guide Note 10/23/2023 Name: DAMIR LEUNG MRN: [id number] DOB: 1961-06-25  DERONTE SOLIS is a 63 y.o. year old male who sees Grayce Sessions, NP for primary care. I reached out to [name] by phone today to offer complex care management services.  Mr. Yannuzzi was given information about Complex Care Management services today including:   The Complex Care Management services include support from the care team which includes your Nurse Care Manager, Clinical Social Worker, or Pharmacist.  The Complex Care Management team is here to help remove barriers to the health concerns and goals most important to you. Complex Care Management services are voluntary, and the patient may decline or stop services at any time by request to their care team member.   Complex Care Management Consent Status: Patient agreed to services and verbal consent obtained.   Follow up plan:  Telephone appointment with complex care management team member scheduled for:  4/11  Encounter Outcome:  Patient Scheduled  Gwenevere Ghazi  [facility]  [facility], [facility] Guide  Direct Dial: [phone]  Fax [phone]

## 2023-10-24 ENCOUNTER — Telehealth: Payer: Self-pay

## 2023-10-24 NOTE — Progress Notes (Signed)
 Complex Care Management Note Care Guide Note  10/24/2023 Name: Adam Callahan MRN: [id number] DOB: [birth year]  Adam Callahan is a 63 y.o. year old male who is a primary care patient of Grayce Sessions, NP . The community resource team was consulted for assistance with Food Insecurity  SDOH screenings and interventions completed:  Yes  Social Drivers of Health From This Encounter   Food Insecurity: Food Insecurity Present (10/24/2023)   Hunger Vital Sign    Worried About Running Out of Food in the Last Year: Sometimes true    Ran Out of Food in the Last Year: Sometimes true  Housing: Low Risk  (10/24/2023)   Housing Stability Vital Sign    Unable to Pay for Housing in the Last Year: No    Number of Times Moved in the Last Year: 0    Homeless in the Last Year: No  Financial Resource Strain: Medium Risk (10/24/2023)   Overall Financial Resource Strain (CARDIA)    Difficulty of Paying Living Expenses: Somewhat hard  Transportation Needs: Unmet Transportation Needs (10/24/2023)   PRAPARE - Administrator, Civil Service (Medical): Yes    Lack of Transportation (Non-Medical): No  Utilities: Not At Risk (10/24/2023)   Utilities    Threatened with loss of utilities: No    SDOH Interventions Today    Flowsheet Row Most Recent Value  SDOH Interventions   Food Insecurity Interventions Other (Comment)  [Mailing food pantry list for [place].]  Transportation Interventions Payor Benefit, Other (Comment)  [Patient has transportation benefit with [facility] [employer] [phone]]        Care guide performed the following interventions: Spoke to patient regarding need for food resources. Patient requested a food pantry list be mailed to his home address.  Follow Up Plan:  No further follow up planned at this time. The patient has been provided with needed resources.  Encounter Outcome:  Patient Visit Completed  Jermarcus Mcfadyen [facility]  [place] Guide Direct Dial: [phone]  Fax: [phone] Website: [email or web address]

## 2023-10-28 ENCOUNTER — Other Ambulatory Visit (HOSPITAL_COMMUNITY): Payer: Self-pay | Admitting: Emergency Medicine

## 2023-10-28 NOTE — Progress Notes (Unsigned)
 Paramedicine Encounter    [name], male    DOB: 1961/01/07, 63 y.o.   MRN: [id number]   Complaints None  Assessment A&O x 4 skin W&D w/ good color.  Denies chest pain or SOB. Lung sounds clear and equal bilat. No peripheral edema noted.  Compliance with meds YES  Pill box filled x 2 weeks  Refills needed Maxide-25  Meds changes since last visit***    Social changes***   There were no vitals taken for this visit. Weight yesterday-*** Last visit weight-***  ACTION: {Paramed Action:[phone]}  Beatrix Shipper, EMT-Paramedic [phone] 10/28/23  Patient Care Team: Grayce Sessions, NP as PCP - General (Internal Medicine)  Patient Active Problem List   Diagnosis Date Noted  . Pain due to onychomycosis of toenails of both feet 08/06/2021  . Diabetic neuropathy (HCC) 08/06/2021  . Bilateral sensorineural hearing loss 03/04/2016  . Cognitive deficit, post-stroke 11/14/2014  . 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 Homonymous hemianopsia following cerebrovascular accident 06/06/2014  . Acute left hemiparesis (HCC) 05/04/2014  . Stroke (HCC) 04/30/2014  . Spastic hemiplegia affecting dominant side (HCC) 10/01/2013  . Aphasia as late effect of cerebrovascular accident 10/01/2013  . 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 Dehydration 06/07/2013  . Hypokalemia 06/07/2013  . Dyslipidemia 06/07/2013  . Cerebral infarction (HCC) 06/06/2013  . Diabetes mellitus type 2, uncontrolled 06/06/2013  . Rhabdomyolysis 06/06/2013    Current Outpatient Medications:  .  amLODipine (NORVASC) 10 MG tablet, Take 1 tablet (10 mg total) by mouth daily., Disp: 90 tablet, Rfl: 1 .  ASPIRIN LOW DOSE 81 MG EC tablet, Take 81 mg by mouth daily., Disp: , Rfl:  .  atorvastatin (LIPITOR) 20 MG tablet, Take 1 tablet (20 mg total) by  mouth daily., Disp: 90 tablet, Rfl: 1 .  insulin glargine (LANTUS SOLOSTAR) 100 UNIT/ML Solostar Pen, Inject 40 Units into the skin daily., Disp: 45 mL, Rfl: 2 .  insulin lispro (HUMALOG KWIKPEN) 100 UNIT/ML KwikPen, Inject 14 Units into the skin 3 (three) times daily., Disp: 15 mL, Rfl: 3 .  Insulin Pen Needle (PEN NEEDLES) 32G X 5 MM MISC, Patient to use needles with pen as needed for injection of insulin, Disp: 100 each, Rfl: 2 .  triamterene-hydrochlorothiazide (MAXZIDE-25) 37.5-25 MG tablet, TAKE 1 TABLET BY MOUTH DAILY, Disp: 90 tablet, Rfl: 1 No Known Allergies   Social History   Socioeconomic History  . Marital status: Divorced    Spouse name: Not on file  . Number of children: 2  . Years of education: 57  . Highest education level: Not on file  Occupational History  . Not on file  Tobacco Use  . Smoking status: Never  . Smokeless tobacco: Never  Vaping Use  . Vaping status: Never Used  Substance and Sexual Activity  . Alcohol use: No    Alcohol/week: 0.0 standard drinks of alcohol  . Drug use: No  . Sexual activity: Not Currently  Other Topics Concern  . Not on file  Social History Narrative   Patient is single and has 2 children.   Patient is right handed.   Patient has hs education.   Patient drinks diet caffeine free sodas daily.   Social Drivers of Health   Financial Resource Strain: Medium Risk (10/24/2023)   Overall Financial Resource Strain (CARDIA)   . Difficulty of Paying Living Expenses: Somewhat hard  Food Insecurity: Food Insecurity Present (10/24/2023)   Hunger Vital Sign   . Worried About Programme researcher, broadcasting/film/video in the Last Year: Sometimes true   . Ran Out of Food in the Last Year: Sometimes true  Transportation Needs: Unmet Transportation Needs (10/24/2023)   PRAPARE - Transportation   . Lack of Transportation (Medical): Yes   . Lack of Transportation (Non-Medical): No  Physical Activity: Inactive (09/17/2023)   Exercise Vital Sign   . Days of Exercise  per Week: 0 days   . Minutes of Exercise per Session: 0 min  Stress: No Stress Concern Present (09/17/2023)   [employer] of Occupational Health - Occupational Stress Questionnaire   . Feeling of Stress : Not at all  Social Connections: Moderately Isolated (09/17/2023)   Social Connection and Isolation Panel [NHANES]   . Frequency of Communication with Friends and Family: More than three times a week   . Frequency of Social Gatherings with Friends and Family: More than three times a week   . Attends Religious Services: More than 4 times per year   . Active Member of Clubs or Organizations: No   . Attends Banker Meetings: Never   . Marital Status: Divorced  Catering manager Violence: Not At Risk (09/17/2023)   Humiliation, Afraid, Rape, and Kick questionnaire   . Fear of Current or Ex-Partner: No   . Emotionally Abused: No   . Physically Abused: No   . Sexually Abused: No    Physical Exam      Future Appointments  Date Time Provider Department Center  10/31/2023  1:00 PM Slusher, Vanice Sarah, RN [facility] None  11/10/2023  2:30 PM Grayce Sessions, NP [facility] None

## 2023-10-31 ENCOUNTER — Other Ambulatory Visit: Payer: Self-pay

## 2023-11-04 ENCOUNTER — Other Ambulatory Visit (HOSPITAL_COMMUNITY): Payer: Self-pay | Admitting: Emergency Medicine

## 2023-11-04 ENCOUNTER — Other Ambulatory Visit (INDEPENDENT_AMBULATORY_CARE_PROVIDER_SITE_OTHER): Payer: Self-pay | Admitting: Primary Care

## 2023-11-04 DIAGNOSIS — I10 Essential (primary) hypertension: Secondary | ICD-10-CM

## 2023-11-04 DIAGNOSIS — Z760 Encounter for issue of repeat prescription: Secondary | ICD-10-CM

## 2023-11-04 DIAGNOSIS — E119 Type 2 diabetes mellitus without complications: Secondary | ICD-10-CM

## 2023-11-04 NOTE — Progress Notes (Signed)
 Paramedicine Encounter    [name], male    DOB: June 26, 1961, 63 y.o.   MRN: [id number]   Complaints NONE  Assessment A&O x 4, skin W&D w/ good color. Denies chest pain or SOB Lung sounds clear and equal throughout.  No edema noted.  Compliance with meds YES  Pill box filled x 1 week  Refills needed Triamterene (called PCP office for refills)  Meds changes since last visit NONE    Social changes NONE   BP (!) 140/70 (BP Location: Left Arm, Patient Position: Sitting, Cuff Size: Normal)   Pulse 92   Resp 16   SpO2 95%  Weight yesterday-not taken Last visit weight- not taken  ATF Mr. Pokorski outside washing out his trash can w/ a garden hose.  He asked for some assistance in getting the faucet turned off and I helped him with this.  We continued inside to complete home visit.  Med box reconciled w/o incident. Assisted pt w/ setting up transportation to his PCP appointment 11/10/23 He will be picked up from his home at 2:12 for his 3:00 appointment and for return home will be picked up @ 3:30.  Pt is aware and agreeable to same.  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) 07/08/2014   Homonymous hemianopsia following cerebrovascular accident 06/06/2014   Acute left hemiparesis (HCC) 05/04/2014   Stroke (HCC) 04/30/2014   Spastic hemiplegia affecting dominant side (HCC) 10/01/2013   Aphasia as late effect of cerebrovascular accident 10/01/2013   Alterations of sensations, late effect of cerebrovascular disease(438.6)  10/01/2013   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   Dehydration 06/07/2013   Hypokalemia 06/07/2013   Dyslipidemia 06/07/2013   Cerebral infarction (HCC) 06/06/2013   Diabetes mellitus type 2, uncontrolled 06/06/2013   Rhabdomyolysis 06/06/2013    Current Outpatient Medications:    amLODipine (NORVASC) 10 MG tablet, Take 1 tablet (10 mg total) by mouth daily., Disp: 90 tablet, Rfl: 1   ASPIRIN LOW DOSE 81 MG EC tablet, Take 81 mg by mouth daily., Disp: , Rfl:    atorvastatin (LIPITOR) 20 MG tablet, Take 1 tablet (20 mg total) by mouth daily., Disp: 90 tablet, Rfl: 1   insulin glargine (LANTUS SOLOSTAR) 100 UNIT/ML Solostar Pen, Inject 40 Units into the skin daily., Disp: 45 mL, Rfl: 2   insulin lispro (HUMALOG KWIKPEN) 100 UNIT/ML KwikPen, Inject 14 Units into the skin 3 (three) times daily., Disp: 15 mL, Rfl: 3   Insulin Pen Needle (PEN NEEDLES) 32G X 5 MM MISC, Patient to use needles with pen as needed for injection of insulin, Disp: 100 each, Rfl: 2   triamterene-hydrochlorothiazide (MAXZIDE-25) 37.5-25 MG tablet, TAKE 1 TABLET BY MOUTH DAILY, Disp: 90 tablet, Rfl: 1 No Known Allergies   Social History   Socioeconomic History   Marital status: Divorced    Spouse name: Not on file   Number of children: 2   Years of education: 12   Highest education level: Not on file  Occupational History   Not on file  Tobacco Use   Smoking status: Never  Smokeless tobacco: Never  Vaping Use   Vaping status: Never Used  Substance and Sexual Activity   Alcohol use: No    Alcohol/week: 0.0 standard drinks of alcohol   Drug use: No   Sexual activity: Not Currently  Other Topics Concern   Not on file  Social History Narrative   Patient is single and has 2 children.   Patient is right handed.   Patient has hs education.   Patient drinks diet caffeine free sodas daily.   Social Drivers of Health   Financial Resource Strain: Medium Risk (10/24/2023)   Overall Financial Resource  Strain (CARDIA)    Difficulty of Paying Living Expenses: Somewhat hard  Food Insecurity: Food Insecurity Present (10/24/2023)   Hunger Vital Sign    Worried About Running Out of Food in the Last Year: Sometimes true    Ran Out of Food in the Last Year: Sometimes true  Transportation Needs: Unmet Transportation Needs (10/24/2023)   PRAPARE - Administrator, Civil Service (Medical): Yes    Lack of Transportation (Non-Medical): No  Physical Activity: Inactive (09/17/2023)   Exercise Vital Sign    Days of Exercise per Week: 0 days    Minutes of Exercise per Session: 0 min  Stress: No Stress Concern Present (09/17/2023)   [employer] of Occupational Health - Occupational Stress Questionnaire    Feeling of Stress : Not at all  Social Connections: Moderately Isolated (09/17/2023)   Social Connection and Isolation Panel [NHANES]    Frequency of Communication with Friends and Family: More than three times a week    Frequency of Social Gatherings with Friends and Family: More than three times a week    Attends Religious Services: More than 4 times per year    Active Member of [employer] or Organizations: No    Attends Banker Meetings: Never    Marital Status: Divorced  Catering manager Violence: Not At Risk (09/17/2023)   Humiliation, Afraid, Rape, and Kick questionnaire    Fear of Current or Ex-Partner: No    Emotionally Abused: No    Physically Abused: No    Sexually Abused: No    Physical Exam      Future Appointments  Date Time Provider Department Center  11/10/2023  2:30 PM Marius Siemens, NP [facility] None

## 2023-11-04 NOTE — Telephone Encounter (Signed)
 Copied from CRM [id number]. Topic: Clinical - Medication Refill >> Nov 04, 2023  1:50 PM Loreda Rodriguez T wrote: Most Recent Primary Care Visit:  Provider: GREER, JULIE M  Department: [facility]  Visit Type: MEDICARE AWV, SEQUENTIAL  Date: 09/17/2023  Medication: triamterene-hydrochlorothiazide (MAXZIDE-25) 37.5-25 MG tablet   Has the patient contacted their pharmacy? No  Is this the correct pharmacy for this prescription? Yes If no, delete pharmacy and type the correct one.  This is the patient's preferred pharmacy:  [facility] [phone] - [place], [place] - [address] AT [facility] [address] [place] [address] Phone: [phone] Fax: [phone]  Has the prescription been filled recently? Yes  Is the patient out of the medication? Yes  Has the patient been seen for an appointment in the last year OR does the patient have an upcoming appointment? Yes  Can we respond through MyChart? No  Agent: Please be advised that Rx refills may take up to 3 business days. We ask that you follow-up with your pharmacy.

## 2023-11-05 MED ORDER — TRIAMTERENE-HCTZ 37.5-25 MG PO TABS: 1.0000 | ORAL_TABLET | Freq: Every day | ORAL | 1 refills | Status: DC

## 2023-11-05 NOTE — Telephone Encounter (Signed)
 Requested Prescriptions  Pending Prescriptions Disp Refills   triamterene-hydrochlorothiazide (MAXZIDE-25) 37.5-25 MG tablet 90 tablet 1    Sig: Take 1 tablet by mouth daily.     Cardiovascular: Diuretic Combos Failed - 11/05/2023  3:18 PM      Failed - Last BP in normal range    BP Readings from Last 1 Encounters:  11/04/23 (!) 140/70         Passed - K in normal range and within 180 days    Potassium  Date Value Ref Range Status  08/12/2023 3.8 3.5 - 5.2 mmol/L Final         Passed - Na in normal range and within 180 days    Sodium  Date Value Ref Range Status  08/12/2023 139 134 - 144 mmol/L Final         Passed - Cr in normal range and within 180 days    Creatinine, Ser  Date Value Ref Range Status  08/12/2023 0.85 0.76 - 1.27 mg/dL Final         Passed - Valid encounter within last 6 months    Recent Outpatient Visits           2 months ago Type 2 diabetes mellitus without complication, without long-term current use of insulin (HCC)   [place] [facility] Marius Siemens, NP   5 months ago Type 2 diabetes mellitus without complication, without long-term current use of insulin (HCC)   [place] [facility] Marius Siemens, NP   8 months ago Essential hypertension   [place] [facility] Marius Siemens, NP   10 months ago Type 2 diabetes mellitus without complication, without long-term current use of insulin (HCC)   [place] [facility] Marius Siemens, NP   11 months ago Type 2 diabetes mellitus with other specified complication, with long-term current use of insulin (HCC)   [facility] Freada Jacobs, Jonathon Neighbors, RPH-CPP       Future Appointments             In 5 days Denece Finger, Meade Spencer, NP [place] [facility]

## 2023-11-10 ENCOUNTER — Encounter (INDEPENDENT_AMBULATORY_CARE_PROVIDER_SITE_OTHER): Payer: Self-pay | Admitting: Primary Care

## 2023-11-10 ENCOUNTER — Ambulatory Visit (INDEPENDENT_AMBULATORY_CARE_PROVIDER_SITE_OTHER): Payer: Self-pay | Admitting: Primary Care

## 2023-11-10 VITALS — BP 153/87 | HR 82 | Resp 16 | Wt 176.2 lb

## 2023-11-10 DIAGNOSIS — E119 Type 2 diabetes mellitus without complications: Secondary | ICD-10-CM | POA: Diagnosis not present

## 2023-11-10 DIAGNOSIS — R531 Weakness: Secondary | ICD-10-CM | POA: Diagnosis not present

## 2023-11-10 DIAGNOSIS — I10 Essential (primary) hypertension: Secondary | ICD-10-CM

## 2023-11-10 LAB — POCT GLYCOSYLATED HEMOGLOBIN (HGB A1C): HbA1c, POC (controlled diabetic range): 6.9 % (ref 0.0–7.0)

## 2023-11-10 NOTE — Progress Notes (Signed)
 [facility]  Draper Gallon, is a 63 y.o. male  XWR:[id number]  XBJ:[id number]  DOB - Aug 08, 1960  Chief Complaint  Patient presents with   Diabetes   Hypertension       Subjective:   Adam Callahan is a 63 y.o. male history of CVA with right-sided weakness and also uses a quad cane for gait stability.  Patient lives up his quad cane at all but 1 other rubber tips are off here today for a follow up visit for the management of hypertension.  Patient has No headache, No chest pain, No abdominal pain - No Nausea, No new weakness tingling or numbness, No Cough - shortness of breath.  Type 2 diabetes Denies polyuria, polydipsia, polyphasia or vision changes.  Does not check blood sugars at home.  Patient has out reach paramedics that check on him weekly and make sure his medications are refilled and placed in a medication box.  Blood pressure is elevated stated he takes his medication every day but it might be elevated from what he ate today which was chips cheese dip and hamburger.   No problems updated.  Comprehensive ROS Pertinent positive and negative noted in HPI   No Known Allergies  Past Medical History:  Diagnosis Date   Diabetes mellitus    Hypertension    Stroke [facility])     Current Outpatient Medications on File Prior to Visit  Medication Sig Dispense Refill   amLODipine  (NORVASC ) 10 MG tablet Take 1 tablet (10 mg total) by mouth daily. 90 tablet 1   ASPIRIN  LOW DOSE 81 MG EC tablet Take 81 mg by mouth daily.     atorvastatin  (LIPITOR) 20 MG tablet Take 1 tablet (20 mg total) by mouth daily. 90 tablet 1   insulin  glargine (LANTUS  SOLOSTAR) 100 UNIT/ML Solostar Pen Inject 40 Units into the skin daily. 45 mL 2   insulin  lispro (HUMALOG  KWIKPEN) 100 UNIT/ML KwikPen Inject 14 Units into the skin 3 (three) times daily. 15 mL 3   Insulin  Pen Needle (PEN NEEDLES) 32G X 5 MM MISC Patient to use needles with pen as needed for injection of insulin  100 each 2    triamterene -hydrochlorothiazide  (MAXZIDE-25) 37.5-25 MG tablet Take 1 tablet by mouth daily. 90 tablet 1   No current facility-administered medications on file prior to visit.   Health Maintenance  Topic Date Due   Eye exam for diabetics  Never done   Zoster (Shingles) Vaccine (1 of 2) Never done   Pneumococcal Vaccination (2 of 2 - PCV) 06/08/2014   COVID-19 Vaccine (3 - Pfizer risk series) 01/03/2020   Complete foot exam   07/09/2023   Yearly kidney health urinalysis for diabetes  09/11/2023   Hemoglobin A1C  02/09/2024   Flu Shot  02/20/2024   Yearly kidney function blood test for diabetes  08/11/2024   Medicare Annual Wellness Visit  09/16/2024   Cologuard (Stool DNA test)  05/15/2025   Hepatitis C Screening  Completed   HIV Screening  Completed   HPV Vaccine  Aged Out   Meningitis B Vaccine  Aged Out   DTaP/Tdap/Td vaccine  Discontinued   Colon Cancer Screening  Discontinued    Objective:   Vitals:   11/10/23 1507  BP: (!) 153/87  Pulse: 82  Resp: 16  SpO2: 99%  Weight: 176 lb 3.2 oz (79.9 kg)   BP Readings from Last 3 Encounters:  11/10/23 (!) 153/87  11/04/23 (!) 140/70  10/29/23 128/70      Physical  Exam Vitals reviewed.  Constitutional:      Appearance: He is obese.  HENT:     Head: Normocephalic.     Right Ear: Tympanic membrane and external ear normal.     Left Ear: Tympanic membrane and external ear normal.     Nose: Nose normal.  Eyes:     Extraocular Movements: Extraocular movements intact.     Pupils: Pupils are equal, round, and reactive to light.  Cardiovascular:     Rate and Rhythm: Normal rate and regular rhythm.  Pulmonary:     Effort: Pulmonary effort is normal.     Breath sounds: Normal breath sounds.  Abdominal:     General: Bowel sounds are normal. There is distension.     Palpations: Abdomen is soft.  Musculoskeletal:     Comments: Right side weakness  Skin:    General: Skin is warm and dry.  Neurological:     Mental Status:  He is oriented to person, place, and time.  Psychiatric:        Mood and Affect: Mood normal.        Behavior: Behavior normal.        Thought Content: Thought content normal.        Judgment: Judgment normal.    Assessment & Plan  Adam Callahan was seen today for diabetes and hypertension.  Diagnoses and all orders for this visit:  Type 2 diabetes mellitus without complication, without long-term current use of insulin  (HCC) -     POCT glycosylated hemoglobin (Hb A1C) 6.9  Essential hypertension See HPI  BP goal - < 140/90 Explained that having normal blood pressure is the goal and medications are helping to get to goal and maintain normal blood pressure. DIET: Limit salt intake, read nutrition labels to check salt content, limit fried and high fatty foods  Avoid using multisymptom OTC cold preparations that generally contain sudafed which can rise BP. Consult with pharmacist on best cold relief products to use for persons with HTN EXERCISE Discussed incorporating exercise such as walking - 30 minutes most days of the week and can do in 10 minute intervals     Right sided weakness -     For home use only DME Other see comment     Patient have been counseled extensively about nutrition and exercise. Other issues discussed during this visit include: low cholesterol diet, weight control and daily exercise, foot care, annual eye examinations at Ophthalmology, importance of adherence with medications and regular follow-up. We also discussed long term complications of uncontrolled diabetes and hypertension.   Return in about 3 months (around 02/09/2024) for medical conditions.  The patient was given clear instructions to go to ER or return to medical center if symptoms don't improve, worsen or new problems develop. The patient verbalized understanding. The patient was told to call to get lab results if they haven't heard anything in the next week.   This note has been created with Biomedical engineer. Any transcriptional errors are unintentional.   Marius Siemens, NP 11/10/2023, 3:29 PM

## 2023-11-11 ENCOUNTER — Other Ambulatory Visit (INDEPENDENT_AMBULATORY_CARE_PROVIDER_SITE_OTHER): Payer: Self-pay | Admitting: Primary Care

## 2023-11-11 DIAGNOSIS — Z131 Encounter for screening for diabetes mellitus: Secondary | ICD-10-CM

## 2023-11-11 DIAGNOSIS — I10 Essential (primary) hypertension: Secondary | ICD-10-CM

## 2023-11-11 DIAGNOSIS — Z760 Encounter for issue of repeat prescription: Secondary | ICD-10-CM

## 2023-11-11 NOTE — Telephone Encounter (Signed)
 Requested Prescriptions  Pending Prescriptions Disp Refills   amLODipine  (NORVASC ) 10 MG tablet [Pharmacy Med Name: amLODIPine  Besylate 10 MG Oral Tablet] 100 tablet 1    Sig: TAKE 1 TABLET BY MOUTH DAILY     Cardiovascular: Calcium  Channel Blockers 2 Failed - 11/11/2023  5:57 PM      Failed - Last BP in normal range    BP Readings from Last 1 Encounters:  11/10/23 (!) 153/87         Passed - Last Heart Rate in normal range    Pulse Readings from Last 1 Encounters:  11/10/23 82         Passed - Valid encounter within last 6 months    Recent Outpatient Visits           Yesterday Type 2 diabetes mellitus without complication, without long-term current use of insulin  (HCC)   [place] [facility] Marius Siemens, NP   3 months ago Type 2 diabetes mellitus without complication, without long-term current use of insulin  (HCC)   [place] [facility] Marius Siemens, NP   6 months ago Type 2 diabetes mellitus without complication, without long-term current use of insulin  (HCC)   [place] [facility] Marius Siemens, NP   8 months ago Essential hypertension   [place] [facility] Marius Siemens, NP   10 months ago Type 2 diabetes mellitus without complication, without long-term current use of insulin  [facility])   [place] [facility] Marius Siemens, NP

## 2023-11-14 ENCOUNTER — Other Ambulatory Visit (HOSPITAL_COMMUNITY): Payer: Self-pay | Admitting: Emergency Medicine

## 2023-11-14 NOTE — Progress Notes (Signed)
 Paramedicine Encounter    [name], male    DOB: April 19, 1961, 63 y.o.   MRN: [id number]   Complaints NONE  Assessment A&O x 4, skin W&D w/ good color.  Denies chest pain or SOB.  Lung sounds clear bilat.  Compliance with meds YES  Pill box filled x 2 weeks  Refills needed NONE  Meds changes since last visit NONE    Social changes NONE   BP 120/78 (BP Location: Left Arm, Patient Position: Sitting, Cuff Size: Normal)   Pulse 90   Resp 16   SpO2 100%  Weight yesterday-  Last visit weight-   ACTION: Home visit completed  [name] [phone] 11/19/23  Patient Care Team: Marius Siemens, NP as PCP - General (Internal Medicine)  Patient Active Problem List   Diagnosis Date Noted   Pain due to onychomycosis of toenails of both feet 08/06/2021   Diabetic neuropathy (HCC) 08/06/2021   Bilateral sensorineural hearing loss 03/04/2016   Cognitive deficit, post-stroke 11/14/2014   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) 07/08/2014   Homonymous hemianopsia following cerebrovascular accident 06/06/2014   Acute left hemiparesis (HCC) 05/04/2014   Stroke (HCC) 04/30/2014   Spastic hemiplegia affecting dominant side (HCC) 10/01/2013   Aphasia as late effect of cerebrovascular accident 10/01/2013   Alterations of sensations, late effect of cerebrovascular disease(438.6) 10/01/2013   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   Dehydration 06/07/2013   Hypokalemia 06/07/2013   Dyslipidemia 06/07/2013   Cerebral infarction (HCC) 06/06/2013   Diabetes mellitus type 2, uncontrolled 06/06/2013   Rhabdomyolysis 06/06/2013    Current Outpatient Medications:    amLODipine  (NORVASC ) 10 MG tablet, TAKE 1 TABLET BY MOUTH DAILY, Disp: 100 tablet, Rfl: 1   ASPIRIN  LOW DOSE 81 MG EC tablet, Take 81 mg by mouth daily., Disp: , Rfl:    atorvastatin  (LIPITOR) 20 MG tablet, Take 1 tablet (20 mg total) by mouth  daily., Disp: 90 tablet, Rfl: 1   insulin  glargine (LANTUS  SOLOSTAR) 100 UNIT/ML Solostar Pen, Inject 40 Units into the skin daily., Disp: 45 mL, Rfl: 2   insulin  lispro (HUMALOG  KWIKPEN) 100 UNIT/ML KwikPen, Inject 14 Units into the skin 3 (three) times daily., Disp: 15 mL, Rfl: 3   Insulin  Pen Needle (PEN NEEDLES) 32G X 5 MM MISC, Patient to use needles with pen as needed for injection of insulin , Disp: 100 each, Rfl: 2   triamterene -hydrochlorothiazide  (MAXZIDE-25) 37.5-25 MG tablet, Take 1 tablet by mouth daily., Disp: 90 tablet, Rfl: 1 No Known Allergies   Social History   Socioeconomic History   Marital status: Divorced    Spouse name: Not on file   Number of children: 2   Years of education: 12   Highest education level: Not on file  Occupational History   Not on file  Tobacco Use   Smoking status: Never   Smokeless tobacco: Never  Vaping Use   Vaping status: Never Used  Substance and Sexual Activity   Alcohol use: No    Alcohol/week: 0.0 standard drinks of alcohol   Drug use: No   Sexual activity: Not Currently  Other Topics Concern   Not on file  Social History Narrative   Patient is single and has 2 children.   Patient is right handed.   Patient has hs education.   Patient drinks diet caffeine free sodas daily.   Social Drivers of Health   Financial Resource Strain: Medium Risk (10/24/2023)   Overall Financial  Resource Strain (CARDIA)    Difficulty of Paying Living Expenses: Somewhat hard  Food Insecurity: Food Insecurity Present (10/24/2023)   Hunger Vital Sign    Worried About Running Out of Food in the Last Year: Sometimes true    Ran Out of Food in the Last Year: Sometimes true  Transportation Needs: Unmet Transportation Needs (10/24/2023)   PRAPARE - Administrator, Civil Service (Medical): Yes    Lack of Transportation (Non-Medical): No  Physical Activity: Inactive (09/17/2023)   Exercise Vital Sign    Days of Exercise per Week: 0 days     Minutes of Exercise per Session: 0 min  Stress: No Stress Concern Present (09/17/2023)   [employer] of Occupational Health - Occupational Stress Questionnaire    Feeling of Stress : Not at all  Social Connections: Moderately Isolated (09/17/2023)   Social Connection and Isolation Panel [NHANES]    Frequency of Communication with Friends and Family: More than three times a week    Frequency of Social Gatherings with Friends and Family: More than three times a week    Attends Religious Services: More than 4 times per year    Active Member of [employer] or Organizations: No    Attends Banker Meetings: Never    Marital Status: Divorced  Catering manager Violence: Not At Risk (09/17/2023)   Humiliation, Afraid, Rape, and Kick questionnaire    Fear of Current or Ex-Partner: No    Emotionally Abused: No    Physically Abused: No    Sexually Abused: No    Physical Exam      Future Appointments  Date Time Provider Department Center  02/09/2024  2:30 PM Marius Siemens, NP [facility] None

## 2023-11-20 ENCOUNTER — Other Ambulatory Visit (HOSPITAL_COMMUNITY): Payer: Self-pay | Admitting: Emergency Medicine

## 2023-11-21 NOTE — Progress Notes (Signed)
 Paramedicine Encounter    [name], male    DOB: Feb 24, 1961, 63 y.o.   MRN: [id number]   Complaints NONE  Assessment A&O x 4, skin W&D w/ good color.  Denies chest pain or SOB.  Lung sound clear bilat.    Compliance with meds Not 100%  misses 1 or 2 pills occasionally. Discussed same.  Pill box filled x 2 weeks  Refills needed NONE  Meds changes since last visit NONE    Social changes NONE   BP (!) 156/80 (BP Location: Left Arm, Patient Position: Sitting, Cuff Size: Normal)   Pulse 80   Resp 16   SpO2 97%   ATF Mr. Bamba A&O x 4, skin W&D w/ good color. Denies chest pain or SOB. Lung sounds clear and equal bilat.  Reviewed w/ pt how to take meds as he misses 1 or 2 pills out of a week.  He says this is not intentional and that sometime the pill doesn't come out of the box and he misses it.  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) 07/08/2014   Homonymous hemianopsia following cerebrovascular accident 06/06/2014   Acute left hemiparesis (HCC) 05/04/2014   Stroke (HCC) 04/30/2014   Spastic hemiplegia affecting dominant side (HCC) 10/01/2013   Aphasia as late effect of cerebrovascular accident 10/01/2013   Alterations of sensations, late effect of cerebrovascular disease(438.6) 10/01/2013   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   Dehydration 06/07/2013   Hypokalemia 06/07/2013   Dyslipidemia 06/07/2013   Cerebral infarction (HCC) 06/06/2013   Diabetes mellitus type 2, uncontrolled 06/06/2013    Rhabdomyolysis 06/06/2013    Current Outpatient Medications:    amLODipine  (NORVASC ) 10 MG tablet, TAKE 1 TABLET BY MOUTH DAILY, Disp: 100 tablet, Rfl: 1   atorvastatin  (LIPITOR) 20 MG tablet, Take 1 tablet (20 mg total) by mouth daily., Disp: 90 tablet, Rfl: 1   insulin  lispro (HUMALOG  KWIKPEN) 100 UNIT/ML KwikPen, Inject 14 Units into the skin 3 (three) times daily., Disp: 15 mL, Rfl: 3   Insulin  Pen Needle (PEN NEEDLES) 32G X 5 MM MISC, Patient to use needles with pen as needed for injection of insulin , Disp: 100 each, Rfl: 2   triamterene -hydrochlorothiazide  (MAXZIDE-25) 37.5-25 MG tablet, Take 1 tablet by mouth daily., Disp: 90 tablet, Rfl: 1   ASPIRIN  LOW DOSE 81 MG EC tablet, Take 81 mg by mouth daily., Disp: , Rfl:    insulin  glargine (LANTUS  SOLOSTAR) 100 UNIT/ML Solostar Pen, Inject 40 Units into the skin daily., Disp: 45 mL, Rfl: 2 No Known Allergies   Social History   Socioeconomic History   Marital status: Divorced    Spouse name: Not on file   Number of children: 2   Years of education: 12   Highest education level: Not on file  Occupational History   Not on file  Tobacco Use   Smoking status: Never   Smokeless tobacco: Never  Vaping Use   Vaping status: Never Used  Substance and Sexual Activity   Alcohol use: No    Alcohol/week: 0.0 standard drinks of alcohol  Drug use: No   Sexual activity: Not Currently  Other Topics Concern   Not on file  Social History Narrative   Patient is single and has 2 children.   Patient is right handed.   Patient has hs education.   Patient drinks diet caffeine free sodas daily.   Social Drivers of Health   Financial Resource Strain: Medium Risk (10/24/2023)   Overall Financial Resource Strain (CARDIA)    Difficulty of Paying Living Expenses: Somewhat hard  Food Insecurity: Food Insecurity Present (10/24/2023)   Hunger Vital Sign    Worried About Running Out of Food in the Last Year: Sometimes true    Ran Out of Food in the  Last Year: Sometimes true  Transportation Needs: Unmet Transportation Needs (10/24/2023)   PRAPARE - Administrator, Civil Service (Medical): Yes    Lack of Transportation (Non-Medical): No  Physical Activity: Inactive (09/17/2023)   Exercise Vital Sign    Days of Exercise per Week: 0 days    Minutes of Exercise per Session: 0 min  Stress: No Stress Concern Present (09/17/2023)   [employer] of Occupational Health - Occupational Stress Questionnaire    Feeling of Stress : Not at all  Social Connections: Moderately Isolated (09/17/2023)   Social Connection and Isolation Panel [NHANES]    Frequency of Communication with Friends and Family: More than three times a week    Frequency of Social Gatherings with Friends and Family: More than three times a week    Attends Religious Services: More than 4 times per year    Active Member of [employer] or Organizations: No    Attends Banker Meetings: Never    Marital Status: Divorced  Catering manager Violence: Not At Risk (09/17/2023)   Humiliation, Afraid, Rape, and Kick questionnaire    Fear of Current or Ex-Partner: No    Emotionally Abused: No    Physically Abused: No    Sexually Abused: No    Physical Exam      Future Appointments  Date Time Provider Department Center  02/09/2024  2:30 PM Marius Siemens, NP [facility] None

## 2023-12-09 ENCOUNTER — Other Ambulatory Visit (HOSPITAL_COMMUNITY): Payer: Self-pay | Admitting: Emergency Medicine

## 2023-12-09 DIAGNOSIS — I639 Cerebral infarction, unspecified: Secondary | ICD-10-CM | POA: Diagnosis not present

## 2023-12-09 DIAGNOSIS — R531 Weakness: Secondary | ICD-10-CM | POA: Diagnosis not present

## 2023-12-09 NOTE — Progress Notes (Signed)
 Paramedicine Encounter    [name], male    DOB: 07/24/1960, 64 y.o.   MRN: [id number]   Complaints YES  Assessment A&O x 4, skin W&D w/ good color.  Pt denies chest pain or SOB.  Lung sounds clear bilat and no edema noted.  Compliance with meds YES  Pill box filled x 2 weeks  Refills needed NONE   Meds changes since last visit NONE    Social changes NONE   BP (!) 140/70 (BP Location: Left Arm, Patient Position: Sitting, Cuff Size: Normal)   Pulse 88  Weight yesterday-  Last visit weight-  Have been having issues w/ reaching Adam Callahan and he states that his phone has been "messed up"  Everything seems to be in working order now.  Med box reconciled x 2 weeks. 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) 07/08/2014   Homonymous hemianopsia following cerebrovascular accident 06/06/2014   Acute left hemiparesis (HCC) 05/04/2014   Stroke (HCC) 04/30/2014   Spastic hemiplegia affecting dominant side (HCC) 10/01/2013   Aphasia as late effect of cerebrovascular accident 10/01/2013   Alterations of sensations, late effect of cerebrovascular disease(438.6) 10/01/2013   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   Dehydration 06/07/2013   Hypokalemia 06/07/2013   Dyslipidemia 06/07/2013   Cerebral infarction (HCC) 06/06/2013   Diabetes mellitus type 2, uncontrolled 06/06/2013   Rhabdomyolysis 06/06/2013    Current Outpatient Medications:    amLODipine  (NORVASC ) 10 MG tablet, TAKE 1  TABLET BY MOUTH DAILY, Disp: 100 tablet, Rfl: 1   ASPIRIN  LOW DOSE 81 MG EC tablet, Take 81 mg by mouth daily., Disp: , Rfl:    atorvastatin  (LIPITOR) 20 MG tablet, Take 1 tablet (20 mg total) by mouth daily., Disp: 90 tablet, Rfl: 1   insulin  glargine (LANTUS  SOLOSTAR) 100 UNIT/ML Solostar Pen, Inject 40 Units into the skin daily., Disp: 45 mL, Rfl: 2   insulin  lispro (HUMALOG  KWIKPEN) 100 UNIT/ML KwikPen, Inject 14 Units into the skin 3 (three) times daily., Disp: 15 mL, Rfl: 3   Insulin  Pen Needle (PEN NEEDLES) 32G X 5 MM MISC, Patient to use needles with pen as needed for injection of insulin , Disp: 100 each, Rfl: 2   triamterene -hydrochlorothiazide  (MAXZIDE-25) 37.5-25 MG tablet, Take 1 tablet by mouth daily., Disp: 90 tablet, Rfl: 1 No Known Allergies   Social History   Socioeconomic History   Marital status: Divorced    Spouse name: Not on file   Number of children: 2   Years of education: 12   Highest education level: Not on file  Occupational History   Not on file  Tobacco Use   Smoking status: Never   Smokeless tobacco: Never  Vaping Use   Vaping status: Never Used  Substance and Sexual Activity   Alcohol use: No    Alcohol/week: 0.0 standard drinks of alcohol   Drug use: No   Sexual activity: Not Currently  Other Topics Concern   Not on file  Social History Narrative   Patient is single and has 2 children.  Patient is right handed.   Patient has hs education.   Patient drinks diet caffeine free sodas daily.   Social Drivers of Health   Financial Resource Strain: Medium Risk (10/24/2023)   Overall Financial Resource Strain (CARDIA)    Difficulty of Paying Living Expenses: Somewhat hard  Food Insecurity: Food Insecurity Present (10/24/2023)   Hunger Vital Sign    Worried About Running Out of Food in the Last Year: Sometimes true    Ran Out of Food in the Last Year: Sometimes true  Transportation Needs: Unmet Transportation Needs (10/24/2023)   PRAPARE -  Administrator, Civil Service (Medical): Yes    Lack of Transportation (Non-Medical): No  Physical Activity: Inactive (09/17/2023)   Exercise Vital Sign    Days of Exercise per Week: 0 days    Minutes of Exercise per Session: 0 min  Stress: No Stress Concern Present (09/17/2023)   [employer] of Occupational Health - Occupational Stress Questionnaire    Feeling of Stress : Not at all  Social Connections: Moderately Isolated (09/17/2023)   Social Connection and Isolation Panel [NHANES]    Frequency of Communication with Friends and Family: More than three times a week    Frequency of Social Gatherings with Friends and Family: More than three times a week    Attends Religious Services: More than 4 times per year    Active Member of [employer] or Organizations: No    Attends Banker Meetings: Never    Marital Status: Divorced  Catering manager Violence: Not At Risk (09/17/2023)   Humiliation, Afraid, Rape, and Kick questionnaire    Fear of Current or Ex-Partner: No    Emotionally Abused: No    Physically Abused: No    Sexually Abused: No    Physical Exam      Future Appointments  Date Time Provider Department Center  12/24/2023  2:00 PM Callahan, Adam Cleaves, RN [facility] None  02/09/2024  2:30 PM Adam Siemens, NP [facility] None

## 2023-12-17 ENCOUNTER — Other Ambulatory Visit (HOSPITAL_COMMUNITY): Payer: Self-pay | Admitting: Emergency Medicine

## 2023-12-17 NOTE — Progress Notes (Signed)
 Paramedicine Encounter    [name], male    DOB: [birth year], 62 y.o.   MRN: [id number]   Complaints NONE  Assessment A&O x 4, skin W&D w/ good color.  Denies chest pain or SOB.  Lung sounds clear and equal bilat.  Compliance with meds YES  Pill box filled x 2 weeks  Refills needed Amlodopine  Meds changes since last visit NONE    Social changes NONE   BP (!) 140/80 (BP Location: Left Arm, Patient Position: Sitting, Cuff Size: Normal)   Pulse 88   Resp 16   SpO2 97%   ATF Adam Callahan A&O x 4, skin W&D w/ good color.  Pt denies chest pain or SOB.  Lung sounds clear throughout.   Pill box reconciled x 2 weeks.  Pt is getting his meds through [facility]. His Freestyle sensor will be due to be replaced on 12/23/23.   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) 07/08/2014   Homonymous hemianopsia following cerebrovascular accident 06/06/2014   Acute left hemiparesis (HCC) 05/04/2014   Stroke (HCC) 04/30/2014   Spastic hemiplegia affecting dominant side (HCC) 10/01/2013   Aphasia as late effect of cerebrovascular accident 10/01/2013   Alterations of sensations, late effect of cerebrovascular disease(438.6) 10/01/2013   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   Dehydration 06/07/2013   Hypokalemia 06/07/2013   Dyslipidemia 06/07/2013   Cerebral infarction (HCC) 06/06/2013   Diabetes mellitus type 2, uncontrolled 06/06/2013   Rhabdomyolysis 06/06/2013    Current Outpatient Medications:    amLODipine  (NORVASC ) 10  MG tablet, TAKE 1 TABLET BY MOUTH DAILY, Disp: 100 tablet, Rfl: 1   ASPIRIN  LOW DOSE 81 MG EC tablet, Take 81 mg by mouth daily., Disp: , Rfl:    atorvastatin  (LIPITOR) 20 MG tablet, Take 1 tablet (20 mg total) by mouth daily., Disp: 90 tablet, Rfl: 1   insulin  glargine (LANTUS  SOLOSTAR) 100 UNIT/ML Solostar Pen, Inject 40 Units into the skin daily., Disp: 45 mL, Rfl: 2   insulin  lispro (HUMALOG  KWIKPEN) 100 UNIT/ML KwikPen, Inject 14 Units into the skin 3 (three) times daily., Disp: 15 mL, Rfl: 3   Insulin  Pen Needle (PEN NEEDLES) 32G X 5 MM MISC, Patient to use needles with pen as needed for injection of insulin , Disp: 100 each, Rfl: 2   triamterene -hydrochlorothiazide  (MAXZIDE-25) 37.5-25 MG tablet, Take 1 tablet by mouth daily., Disp: 90 tablet, Rfl: 1 No Known Allergies   Social History   Socioeconomic History   Marital status: Divorced    Spouse name: Not on file   Number of children: 2   Years of education: 12   Highest education level: Not on file  Occupational History   Not on file  Tobacco Use   Smoking status: Never   Smokeless tobacco: Never  Vaping Use   Vaping status: Never Used  Substance and Sexual Activity   Alcohol use: No    Alcohol/week: 0.0 standard drinks of alcohol   Drug use: No   Sexual activity: Not Currently  Other Topics Concern   Not on file  Social History  Narrative   Patient is single and has 2 children.   Patient is right handed.   Patient has hs education.   Patient drinks diet caffeine free sodas daily.   Social Drivers of Health   Financial Resource Strain: Medium Risk (10/24/2023)   Overall Financial Resource Strain (CARDIA)    Difficulty of Paying Living Expenses: Somewhat hard  Food Insecurity: Food Insecurity Present (10/24/2023)   Hunger Vital Sign    Worried About Running Out of Food in the Last Year: Sometimes true    Ran Out of Food in the Last Year: Sometimes true  Transportation Needs: Unmet Transportation Needs (10/24/2023)    PRAPARE - Administrator, Civil Service (Medical): Yes    Lack of Transportation (Non-Medical): No  Physical Activity: Inactive (09/17/2023)   Exercise Vital Sign    Days of Exercise per Week: 0 days    Minutes of Exercise per Session: 0 min  Stress: No Stress Concern Present (09/17/2023)   [employer] of Occupational Health - Occupational Stress Questionnaire    Feeling of Stress : Not at all  Social Connections: Moderately Isolated (09/17/2023)   Social Connection and Isolation Panel [NHANES]    Frequency of Communication with Friends and Family: More than three times a week    Frequency of Social Gatherings with Friends and Family: More than three times a week    Attends Religious Services: More than 4 times per year    Active Member of [employer] or Organizations: No    Attends Banker Meetings: Never    Marital Status: Divorced  Catering manager Violence: Not At Risk (09/17/2023)   Humiliation, Afraid, Rape, and Kick questionnaire    Fear of Current or Ex-Partner: No    Emotionally Abused: No    Physically Abused: No    Sexually Abused: No    Physical Exam      Future Appointments  Date Time Provider Department Center  12/24/2023  2:00 PM Slusher, Rojean Cleaves, RN [facility] None  02/09/2024  2:30 PM Marius Siemens, NP [facility] None

## 2023-12-24 ENCOUNTER — Telehealth: Payer: Self-pay

## 2023-12-26 ENCOUNTER — Other Ambulatory Visit (HOSPITAL_COMMUNITY): Payer: Self-pay | Admitting: Emergency Medicine

## 2023-12-26 NOTE — Progress Notes (Signed)
 Paramedicine Encounter    [name], male    DOB: 08/19/1960, 63 y.o.   MRN: [id number]   Complaints NONE  Assessment A&O x 4, skin W&D w/ good color.  Denies chest pain or SOB.  Lung sounds clear and no peripheral edema noted.  BP elevated.  Denies headache or visual disturbances.    Compliance with meds No- missed several doses  Pill box filled x 2 weeks  Refills needed NONE  Meds changes since last visit NONE    Social changes NONE   BP (!) 150/72 (BP Location: Left Arm, Patient Position: Sitting, Cuff Size: Normal)   Pulse 76   Resp 16   Applied new Freestyle sensor to posterior rt arm will need to be changed in 14 days (6/20)  ACTION: Home visit completed  [name] [phone] 12/26/23  Patient Care Team: Marius Siemens, NP as PCP - General (Internal Medicine)  Patient Active Problem List   Diagnosis Date Noted   Pain due to onychomycosis of toenails of both feet 08/06/2021   Diabetic neuropathy (HCC) 08/06/2021   Bilateral sensorineural hearing loss 03/04/2016   Cognitive deficit, post-stroke 11/14/2014   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) 07/08/2014   Homonymous hemianopsia following cerebrovascular accident 06/06/2014   Acute left hemiparesis (HCC) 05/04/2014   Stroke (HCC) 04/30/2014   Spastic hemiplegia affecting dominant side (HCC) 10/01/2013   Aphasia as late effect of cerebrovascular accident 10/01/2013   Alterations of sensations, late effect of cerebrovascular disease(438.6) 10/01/2013   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   Dehydration 06/07/2013   Hypokalemia 06/07/2013   Dyslipidemia 06/07/2013   Cerebral infarction (HCC) 06/06/2013   Diabetes mellitus type 2, uncontrolled 06/06/2013   Rhabdomyolysis 06/06/2013    Current Outpatient Medications:    amLODipine  (NORVASC ) 10 MG tablet, TAKE 1 TABLET BY MOUTH DAILY, Disp: 100 tablet, Rfl: 1   ASPIRIN  LOW  DOSE 81 MG EC tablet, Take 81 mg by mouth daily., Disp: , Rfl:    atorvastatin  (LIPITOR) 20 MG tablet, Take 1 tablet (20 mg total) by mouth daily., Disp: 90 tablet, Rfl: 1   insulin  glargine (LANTUS  SOLOSTAR) 100 UNIT/ML Solostar Pen, Inject 40 Units into the skin daily., Disp: 45 mL, Rfl: 2   insulin  lispro (HUMALOG  KWIKPEN) 100 UNIT/ML KwikPen, Inject 14 Units into the skin 3 (three) times daily., Disp: 15 mL, Rfl: 3   Insulin  Pen Needle (PEN NEEDLES) 32G X 5 MM MISC, Patient to use needles with pen as needed for injection of insulin , Disp: 100 each, Rfl: 2   triamterene -hydrochlorothiazide  (MAXZIDE-25) 37.5-25 MG tablet, Take 1 tablet by mouth daily., Disp: 90 tablet, Rfl: 1 No Known Allergies   Social History   Socioeconomic History   Marital status: Divorced    Spouse name: Not on file   Number of children: 2   Years of education: 12   Highest education level: Not on file  Occupational History   Not on file  Tobacco Use   Smoking status: Never   Smokeless tobacco: Never  Vaping Use   Vaping status: Never Used  Substance and Sexual Activity   Alcohol use: No    Alcohol/week: 0.0 standard drinks of alcohol   Drug use: No   Sexual activity: Not Currently  Other Topics Concern   Not on file  Social History Narrative   Patient is single and has 2 children.   Patient is right handed.   Patient has hs education.  Patient drinks diet caffeine free sodas daily.   Social Drivers of Health   Financial Resource Strain: Medium Risk (10/24/2023)   Overall Financial Resource Strain (CARDIA)    Difficulty of Paying Living Expenses: Somewhat hard  Food Insecurity: Food Insecurity Present (10/24/2023)   Hunger Vital Sign    Worried About Running Out of Food in the Last Year: Sometimes true    Ran Out of Food in the Last Year: Sometimes true  Transportation Needs: Unmet Transportation Needs (10/24/2023)   PRAPARE - Administrator, Civil Service (Medical): Yes    Lack of  Transportation (Non-Medical): No  Physical Activity: Inactive (09/17/2023)   Exercise Vital Sign    Days of Exercise per Week: 0 days    Minutes of Exercise per Session: 0 min  Stress: No Stress Concern Present (09/17/2023)   [employer] of Occupational Health - Occupational Stress Questionnaire    Feeling of Stress : Not at all  Social Connections: Moderately Isolated (09/17/2023)   Social Connection and Isolation Panel [NHANES]    Frequency of Communication with Friends and Family: More than three times a week    Frequency of Social Gatherings with Friends and Family: More than three times a week    Attends Religious Services: More than 4 times per year    Active Member of [employer] or Organizations: No    Attends Banker Meetings: Never    Marital Status: Divorced  Catering manager Violence: Not At Risk (09/17/2023)   Humiliation, Afraid, Rape, and Kick questionnaire    Fear of Current or Ex-Partner: No    Emotionally Abused: No    Physically Abused: No    Sexually Abused: No    Physical Exam      Future Appointments  Date Time Provider Department Center  12/29/2023 10:45 AM Slusher, Rojean Cleaves, RN [facility] None  02/09/2024  2:30 PM Marius Siemens, NP [facility] None

## 2023-12-29 ENCOUNTER — Other Ambulatory Visit: Payer: Self-pay

## 2024-01-02 ENCOUNTER — Other Ambulatory Visit (INDEPENDENT_AMBULATORY_CARE_PROVIDER_SITE_OTHER): Payer: Self-pay | Admitting: Primary Care

## 2024-01-02 DIAGNOSIS — Z760 Encounter for issue of repeat prescription: Secondary | ICD-10-CM

## 2024-01-02 NOTE — Telephone Encounter (Signed)
 Requested Prescriptions  Pending Prescriptions Disp Refills   insulin  lispro (HUMALOG  KWIKPEN) 100 UNIT/ML KwikPen [Pharmacy Med Name: HumaLOG  KwikPen 100 UNIT/ML Subcutaneous Solution Pen-injector] 15 mL 0    Sig: INJECT 14 UNITS INTO THE SKIN 3  TIMES DAILY     Endocrinology:  Diabetes - Insulins Passed - 01/02/2024  8:47 AM      Passed - HBA1C is between 0 and 7.9 and within 180 days    HbA1c, POC (controlled diabetic range)  Date Value Ref Range Status  11/10/2023 6.9 0.0 - 7.0 % Final         Passed - Valid encounter within last 6 months    Recent Outpatient Visits           1 month ago Type 2 diabetes mellitus without complication, without long-term current use of insulin  (HCC)   [place] [facility] Marius Siemens, NP   4 months ago Type 2 diabetes mellitus without complication, without long-term current use of insulin  (HCC)   [place] [facility] Marius Siemens, NP   7 months ago Type 2 diabetes mellitus without complication, without long-term current use of insulin  (HCC)   [place] [facility] Marius Siemens, NP   9 months ago Essential hypertension   [place] [facility] Marius Siemens, NP   12 months ago Type 2 diabetes mellitus without complication, without long-term current use of insulin  [facility])   [place] [facility] Marius Siemens, NP

## 2024-01-05 NOTE — Progress Notes (Signed)
 Patient has had 3 unsuccessful outreaches   Adam Callahan  [facility]  [facility], [facility] Guide  Direct Dial : [phone]  Fax [phone]

## 2024-01-16 ENCOUNTER — Other Ambulatory Visit (HOSPITAL_COMMUNITY): Payer: Self-pay | Admitting: Emergency Medicine

## 2024-01-16 NOTE — Progress Notes (Unsigned)
 Paramedicine Encounter    [name], male    DOB: 08-13-1960, 63 y.o.   MRN: [id number]   Complaints NONE  Assessment A& x 4, skin W&D w/ good color.  Denies chest pain or SOB.  Lung sounds clear bilat.  No edema noted.  Compliance with meds YES  Pill box filled x 2 weeks  Refills needed Triamterene   Meds changes since last visit NONE    Social changes NONE   BP (!) 190/100 (BP Location: Left Arm, Patient Position: Sitting, Cuff Size: Normal)   Pulse 88   Resp 16   SpO2 98%   ATF [name] A&O x 4, skin W&D w/ good color.  Pt reports to be doing well.  Today his blood pressure is elevated 190/100.  He denies headache or visual disturbances.  He states that his sister in on her death bed and he has been terribly worried about her and trying to get someone to take him down to [place] to see her before she dies.   He has had some friends from the church to come to his home and help him clean up outside and inside.  They also assisted him in repairing a window that was broken and had been covered by plywood.   Assisted Mr. Music w/ applying his Freestyle Libre patch w/o incident.     ACTION: {Paramed Action:[phone]}  [name], EMT-Paramedic [phone] 01/16/24  Patient Care Team: Celestia Rosaline SQUIBB, NP as PCP - General (Internal Medicine)  Patient Active Problem List   Diagnosis Date Noted  . Pain due to onychomycosis of toenails of both feet 08/06/2021  . Diabetic neuropathy (HCC) 08/06/2021  . Bilateral sensorineural hearing loss 03/04/2016  . Cognitive deficit, post-stroke 11/14/2014  . 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 Homonymous hemianopsia following cerebrovascular accident 06/06/2014  . Acute left hemiparesis (HCC) 05/04/2014  . Stroke (HCC) 04/30/2014  . Spastic hemiplegia affecting dominant side (HCC) 10/01/2013  . Aphasia as late effect of cerebrovascular accident 10/01/2013   . 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 Dehydration 06/07/2013  . Hypokalemia 06/07/2013  . Dyslipidemia 06/07/2013  . Cerebral infarction (HCC) 06/06/2013  . Diabetes mellitus type 2, uncontrolled 06/06/2013  . Rhabdomyolysis 06/06/2013    Current Outpatient Medications:  .  amLODipine  (NORVASC ) 10 MG tablet, TAKE 1 TABLET BY MOUTH DAILY, Disp: 100 tablet, Rfl: 1 .  ASPIRIN  LOW DOSE 81 MG EC tablet, Take 81 mg by mouth daily., Disp: , Rfl:  .  atorvastatin  (LIPITOR) 20 MG tablet, Take 1 tablet (20 mg total) by mouth daily., Disp: 90 tablet, Rfl: 1 .  triamterene -hydrochlorothiazide  (MAXZIDE-25) 37.5-25 MG tablet, Take 1 tablet by mouth daily., Disp: 90 tablet, Rfl: 1 .  insulin  glargine (LANTUS  SOLOSTAR) 100 UNIT/ML Solostar Pen, Inject 40 Units into the skin daily., Disp: 45 mL, Rfl: 2 .  insulin  lispro (HUMALOG  KWIKPEN) 100 UNIT/ML KwikPen, INJECT 14 UNITS INTO THE SKIN 3  TIMES DAILY, Disp: 15 mL, Rfl: 0 .  Insulin  Pen Needle (PEN NEEDLES) 32G X 5 MM MISC, Patient to use needles with pen as needed for injection of insulin , Disp: 100 each, Rfl: 2 No Known Allergies   Social History   Socioeconomic History  . Marital status: Divorced    Spouse name: Not on file  . Number of children: 2  . Years of education: 21  . Highest education level:  Not on file  Occupational History  . Not on file  Tobacco Use  . Smoking status: Never  . Smokeless tobacco: Never  Vaping Use  . Vaping status: Never Used  Substance and Sexual Activity  . Alcohol use: No    Alcohol/week: 0.0 standard drinks of alcohol  . Drug use: No  . Sexual activity: Not Currently  Other Topics Concern  . Not on file  Social History Narrative   Patient is single and has 2 children.   Patient is right handed.   Patient has hs education.   Patient drinks diet caffeine free sodas daily.   Social Drivers of Health    Financial Resource Strain: Medium Risk (10/24/2023)   Overall Financial Resource Strain (CARDIA)   . Difficulty of Paying Living Expenses: Somewhat hard  Food Insecurity: Food Insecurity Present (10/24/2023)   Hunger Vital Sign   . Worried About Programme researcher, broadcasting/film/video in the Last Year: Sometimes true   . Ran Out of Food in the Last Year: Sometimes true  Transportation Needs: Unmet Transportation Needs (10/24/2023)   PRAPARE - Transportation   . Lack of Transportation (Medical): Yes   . Lack of Transportation (Non-Medical): No  Physical Activity: Inactive (09/17/2023)   Exercise Vital Sign   . Days of Exercise per Week: 0 days   . Minutes of Exercise per Session: 0 min  Stress: No Stress Concern Present (09/17/2023)   [employer] of Occupational Health - Occupational Stress Questionnaire   . Feeling of Stress : Not at all  Social Connections: Moderately Isolated (09/17/2023)   Social Connection and Isolation Panel   . Frequency of Communication with Friends and Family: More than three times a week   . Frequency of Social Gatherings with Friends and Family: More than three times a week   . Attends Religious Services: More than 4 times per year   . Active Member of Clubs or Organizations: No   . Attends Banker Meetings: Never   . Marital Status: Divorced  Catering manager Violence: Not At Risk (09/17/2023)   Humiliation, Afraid, Rape, and Kick questionnaire   . Fear of Current or Ex-Partner: No   . Emotionally Abused: No   . Physically Abused: No   . Sexually Abused: No    Physical Exam      Future Appointments  Date Time Provider Department Center  02/09/2024  2:30 PM Celestia Rosaline SQUIBB, NP [facility] None

## 2024-01-20 DIAGNOSIS — R531 Weakness: Secondary | ICD-10-CM | POA: Diagnosis not present

## 2024-01-20 DIAGNOSIS — I639 Cerebral infarction, unspecified: Secondary | ICD-10-CM | POA: Diagnosis not present

## 2024-01-29 ENCOUNTER — Other Ambulatory Visit (HOSPITAL_COMMUNITY): Payer: Self-pay | Admitting: Emergency Medicine

## 2024-01-29 NOTE — Progress Notes (Signed)
 Paramedicine Encounter    [name], male    DOB: 04-Aug-1960, 63 y.o.   MRN: [id number]   Complaints NONE  Assessment A&O x 4, skin W&D w/ good color.  Denies chest pain or SOB.  Lung sounds clear throughout  Compliance with meds YES  Pill box filled x 2 weeks  Refills needed Triamterene   Meds changes since last visit NONE    Social changes NONE   BP 120/80 (BP Location: Right Leg, Patient Position: Sitting, Cuff Size: Normal)   Pulse 83   Resp 16   SpO2 96%   ATF Adam Callahan A&O x 4, skin W&D w/ good color.  He reports to be feeling well.  He advised he was able to take a trip w/ a friend to [place] to visit his sister who is at end of life.   Med box reconciled x 2 weeks. Refill on Triamterene  ready for pick up tomorrow.  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) 07/08/2014   Homonymous hemianopsia following cerebrovascular accident 06/06/2014   Acute left hemiparesis (HCC) 05/04/2014   Stroke (HCC) 04/30/2014   Spastic hemiplegia affecting dominant side (HCC) 10/01/2013   Aphasia as late effect of cerebrovascular accident 10/01/2013   Alterations of sensations, late effect of cerebrovascular disease(438.6) 10/01/2013   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   Dehydration 06/07/2013   Hypokalemia 06/07/2013   Dyslipidemia 06/07/2013   Cerebral infarction (HCC) 06/06/2013   Diabetes mellitus type 2, uncontrolled 06/06/2013   Rhabdomyolysis 06/06/2013    Current Outpatient Medications:     amLODipine  (NORVASC ) 10 MG tablet, TAKE 1 TABLET BY MOUTH DAILY, Disp: 100 tablet, Rfl: 1   ASPIRIN  LOW DOSE 81 MG EC tablet, Take 81 mg by mouth daily., Disp: , Rfl:    atorvastatin  (LIPITOR) 20 MG tablet, Take 1 tablet (20 mg total) by mouth daily., Disp: 90 tablet, Rfl: 1   insulin  glargine (LANTUS  SOLOSTAR) 100 UNIT/ML Solostar Pen, Inject 40 Units into the skin daily., Disp: 45 mL, Rfl: 2   insulin  lispro (HUMALOG  KWIKPEN) 100 UNIT/ML KwikPen, INJECT 14 UNITS INTO THE SKIN 3  TIMES DAILY, Disp: 15 mL, Rfl: 0   Insulin  Pen Needle (PEN NEEDLES) 32G X 5 MM MISC, Patient to use needles with pen as needed for injection of insulin , Disp: 100 each, Rfl: 2   triamterene -hydrochlorothiazide  (MAXZIDE-25) 37.5-25 MG tablet, Take 1 tablet by mouth daily., Disp: 90 tablet, Rfl: 1 No Known Allergies   Social History   Socioeconomic History   Marital status: Divorced    Spouse name: Not on file   Number of children: 2   Years of education: 12   Highest education level: Not on file  Occupational History   Not on file  Tobacco Use   Smoking status: Never   Smokeless tobacco: Never  Vaping Use   Vaping status: Never Used  Substance and Sexual Activity   Alcohol use: No    Alcohol/week: 0.0 standard drinks of alcohol   Drug use: No   Sexual activity: Not Currently  Other Topics Concern   Not on  file  Social History Narrative   Patient is single and has 2 children.   Patient is right handed.   Patient has hs education.   Patient drinks diet caffeine free sodas daily.   Social Drivers of Health   Financial Resource Strain: Medium Risk (10/24/2023)   Overall Financial Resource Strain (CARDIA)    Difficulty of Paying Living Expenses: Somewhat hard  Food Insecurity: Food Insecurity Present (10/24/2023)   Hunger Vital Sign    Worried About Running Out of Food in the Last Year: Sometimes true    Ran Out of Food in the Last Year: Sometimes true  Transportation Needs: Unmet Transportation Needs  (10/24/2023)   PRAPARE - Administrator, Civil Service (Medical): Yes    Lack of Transportation (Non-Medical): No  Physical Activity: Inactive (09/17/2023)   Exercise Vital Sign    Days of Exercise per Week: 0 days    Minutes of Exercise per Session: 0 min  Stress: No Stress Concern Present (09/17/2023)   [employer] of Occupational Health - Occupational Stress Questionnaire    Feeling of Stress : Not at all  Social Connections: Moderately Isolated (09/17/2023)   Social Connection and Isolation Panel    Frequency of Communication with Friends and Family: More than three times a week    Frequency of Social Gatherings with Friends and Family: More than three times a week    Attends Religious Services: More than 4 times per year    Active Member of [employer] or Organizations: No    Attends Banker Meetings: Never    Marital Status: Divorced  Catering manager Violence: Not At Risk (09/17/2023)   Humiliation, Afraid, Rape, and Kick questionnaire    Fear of Current or Ex-Partner: No    Emotionally Abused: No    Physically Abused: No    Sexually Abused: No    Physical Exam      Future Appointments  Date Time Provider Department Center  02/09/2024  2:30 PM Celestia Rosaline SQUIBB, NP [facility] None

## 2024-02-09 ENCOUNTER — Ambulatory Visit (INDEPENDENT_AMBULATORY_CARE_PROVIDER_SITE_OTHER): Admitting: Primary Care

## 2024-02-09 ENCOUNTER — Encounter (INDEPENDENT_AMBULATORY_CARE_PROVIDER_SITE_OTHER): Payer: Self-pay | Admitting: Primary Care

## 2024-02-09 VITALS — BP 142/90 | HR 92 | Resp 16 | Wt 175.0 lb

## 2024-02-09 DIAGNOSIS — Z794 Long term (current) use of insulin: Secondary | ICD-10-CM

## 2024-02-09 DIAGNOSIS — E785 Hyperlipidemia, unspecified: Secondary | ICD-10-CM

## 2024-02-09 DIAGNOSIS — Z760 Encounter for issue of repeat prescription: Secondary | ICD-10-CM | POA: Diagnosis not present

## 2024-02-09 DIAGNOSIS — E119 Type 2 diabetes mellitus without complications: Secondary | ICD-10-CM | POA: Diagnosis not present

## 2024-02-09 DIAGNOSIS — I10 Essential (primary) hypertension: Secondary | ICD-10-CM

## 2024-02-09 DIAGNOSIS — L602 Onychogryphosis: Secondary | ICD-10-CM | POA: Diagnosis not present

## 2024-02-09 LAB — POCT GLYCOSYLATED HEMOGLOBIN (HGB A1C): HbA1c, POC (controlled diabetic range): 7.8 % — AB (ref 0.0–7.0)

## 2024-02-09 MED ORDER — LANTUS SOLOSTAR 100 UNIT/ML ~~LOC~~ SOPN: 40.0000 [IU] | PEN_INJECTOR | Freq: Every day | SUBCUTANEOUS | 2 refills | Status: DC

## 2024-02-09 MED ORDER — PEN NEEDLES 32G X 5 MM MISC: 2 refills | Status: AC

## 2024-02-09 MED ORDER — INSULIN LISPRO (1 UNIT DIAL) 100 UNIT/ML (KWIKPEN): 14.0000 [IU] | PEN_INJECTOR | Freq: Three times a day (TID) | SUBCUTANEOUS | 3 refills | Status: DC

## 2024-02-09 MED ORDER — TRIAMTERENE-HCTZ 37.5-25 MG PO TABS: 1.0000 | ORAL_TABLET | Freq: Every day | ORAL | 1 refills | Status: DC

## 2024-02-09 MED ORDER — ATORVASTATIN CALCIUM 20 MG PO TABS: 20.0000 mg | ORAL_TABLET | Freq: Every day | ORAL | 1 refills | Status: DC

## 2024-02-09 MED ORDER — PEN NEEDLES 32G X 5 MM MISC: 2 refills | Status: DC

## 2024-02-09 NOTE — Progress Notes (Signed)
 Subjective:  [name], male    DOB: 03-18-1961  Age: 63 y.o. MRN: [id number]  CC: Diabetes and Hypertension   Adam Callahan presents for Follow-up of diabetes. Patient check blood sugar at home- [place]. Right sided weakness and unstable gait uses a walker   Compliant with meds - Yes Checking CBGs? Libre  Fasting avg -   Postprandial average -  Exercising regularly? - No Watching carbohydrate intake? - Yes Neuropathy ? - No Hypoglycemic events - No  - Recovers with :   Pertinent ROS:  Polyuria - No Polydipsia - No Vision problems - No  Medications as noted below. Taking them regularly without complication/adverse reaction being reported today.   History Adam Callahan has a past medical history of Diabetes mellitus, Hypertension, and Stroke (HCC).   Adam Callahan has a past surgical history that includes TEE without cardioversion (N/A, 05/03/2014).   His family history includes Cervical cancer in his mother; Colon cancer in his maternal aunt; Diabetes Mellitus II in his maternal uncle; Hypertension in his sister.Adam Callahan reports that Adam Callahan has never smoked. Adam Callahan has never used smokeless tobacco. Adam Callahan reports that Adam Callahan does not drink alcohol and does not use drugs.  Current Outpatient Medications on File Prior to Visit  Medication Sig Dispense Refill   amLODipine  (NORVASC ) 10 MG tablet TAKE 1 TABLET BY MOUTH DAILY 100 tablet 1   ASPIRIN  LOW DOSE 81 MG EC tablet Take 81 mg by mouth daily.     No current facility-administered medications on file prior to visit.    Review of Systems Comprehensive ROS Pertinent positive and negative noted in HPI   Objective:  BP (!) 142/90 (BP Location: Left Arm, Patient Position: Sitting, Cuff Size: Normal)   Pulse 92   Resp 16   Wt 175 lb (79.4 kg)   SpO2 99%   BMI 29.12 kg/m   BP Readings from Last 3 Encounters:  02/11/24 130/78  02/09/24 (!) 142/90  01/29/24 120/80    Wt Readings from Last 3 Encounters:  02/09/24 175 lb (79.4 kg)   11/10/23 176 lb 3.2 oz (79.9 kg)  10/01/23 173 lb (78.5 kg)    Physical Exam Vitals reviewed.  HENT:     Right Ear: External ear normal.     Left Ear: External ear normal.     Nose: Nose normal.  Eyes:     Extraocular Movements: Extraocular movements intact.  Cardiovascular:     Rate and Rhythm: Normal rate and regular rhythm.  Pulmonary:     Effort: Pulmonary effort is normal.     Breath sounds: Normal breath sounds.  Abdominal:     General: Bowel sounds are normal. There is distension.     Palpations: Abdomen is soft.  Musculoskeletal:     Cervical back: Normal range of motion.     Comments: Right sided weakness and unstable gait uses a walker    Skin:    General: Skin is warm and dry.  Neurological:     Mental Status: Adam Callahan is alert and oriented to person, place, and time.  Psychiatric:        Mood and Affect: Mood normal.        Behavior: Behavior normal.     Lab Results  Component Value Date   HGBA1C 7.8 (A) 02/09/2024   HGBA1C 6.9 11/10/2023   HGBA1C 8.4 (A) 08/12/2023    Lab Results  Component Value Date   WBC 6.4 02/09/2024   HGB 14.1 02/09/2024   HCT 44.3  02/09/2024   PLT 381 02/09/2024   GLUCOSE 226 (H) 02/09/2024   CHOL 127 02/09/2024   TRIG 112 02/09/2024   HDL 45 02/09/2024   LDLCALC 62 02/09/2024   ALT 22 02/09/2024   AST 20 02/09/2024   NA 138 02/09/2024   K 4.5 02/09/2024   CL 93 (L) 02/09/2024   CREATININE 1.16 02/09/2024   BUN 17 02/09/2024   CO2 29 02/09/2024   TSH 0.478 06/06/2013   INR 1.04 04/27/2014   HGBA1C 7.8 (A) 02/09/2024    Title   Diabetic Foot Exam - detailed Date & Time: 02/09/2024  3:08 PM Diabetic Foot exam was performed with the following findings: Yes  Visual Foot Exam completed.: Yes  Is there a history of foot ulcer?: No Is there a foot ulcer now?: No Is there swelling?: No Is there elevated skin temperature?: No Is there abnormal foot shape?: No Is there a claw toe deformity?: Yes Are the toenails long?:  Yes Are the toenails thick?: Yes Are the toenails ingrown?: No Is the skin thin, fragile, shiny and hairless?: No Normal Range of Motion?: Yes Is there foot or ankle muscle weakness?: No Do you have pain in calf while walking?: No Are the shoes appropriate in style and fit?: Yes Can the patient see the bottom of their feet?: No Pulse Foot Exam completed.: Yes   Right Dorsalis Pedis: Present Left Dorsalis Pedis: Present     Sensory Foot Exam Completed.: Yes Semmes-Weinstein Monofilament Test + means has sensation and - means no sensation  R Foot Test Control: Pos L Foot Test Control: Pos   R Site 1-Great Toe: Pos L Site 1-Great Toe: Pos   R Site 4: Pos L Site 4: Pos   R site 5: Pos L Site 5: Pos  R Site 6: Pos L Site 6: Pos     Image components are not supported.   Image components are not supported. Image components are not supported.  [place] Right vibratory: present Left vibratory: present  Comments      Assessment & Plan:   Adam Callahan was seen today for diabetes and hypertension.  Diagnoses and all orders for this visit:  Type 2 diabetes mellitus without complication, without long-term current use of insulin  (HCC) - educated on lifestyle modifications, including but not limited to diet choices and adding exercise to daily routine.   -     POCT glycosylated hemoglobin (Hb A1C) -     Microalbumin / creatinine urine ratio -     Ambulatory referral to Ophthalmology -     triamterene -hydrochlorothiazide  (MAXZIDE-25) 37.5-25 MG tablet; Take 1 tablet by mouth daily. -     Insulin  Pen Needle (PEN NEEDLES) 32G X 5 MM MISC; Patient to use needles with pen as needed for injection of insulin  -     insulin  lispro (HUMALOG  KWIKPEN) 100 UNIT/ML KwikPen; Inject 14 Units into the skin 3 (three) times daily. -     insulin  glargine (LANTUS  SOLOSTAR) 100 UNIT/ML Solostar Pen; Inject 40 Units into the skin daily.  Onychauxis  -     Ambulatory referral to  Podiatry  Essential hypertension BP goal - < 140/90 Explained that having normal blood pressure is the goal and medications are helping to get to goal and maintain normal blood pressure. DIET: Limit salt intake, read nutrition labels to check salt content, limit fried and high fatty foods  Avoid using multisymptom OTC cold preparations that generally contain sudafed which can rise BP. Consult with pharmacist  on best cold relief products to use for persons with HTN EXERCISE Discussed incorporating exercise such as walking - 30 minutes most days of the week and can do in 10 minute intervals    -     triamterene -hydrochlorothiazide  (MAXZIDE-25) 37.5-25 MG tablet; Take 1 tablet by mouth daily.  Medication refill -     triamterene -hydrochlorothiazide  (MAXZIDE-25) 37.5-25 MG tablet; Take 1 tablet by mouth daily. -     insulin  lispro (HUMALOG  KWIKPEN) 100 UNIT/ML KwikPen; Inject 14 Units into the skin 3 (three) times daily.  Dyslipidemia -     atorvastatin  (LIPITOR) 20 MG tablet; Take 1 tablet (20 mg total) by mouth daily.     Follow-up:  3 months  The above assessment and management plan was discussed with the patient. The patient verbalized understanding of and has agreed to the management plan. Patient is aware to call the clinic if symptoms fail to improve or worsen. Patient is aware when to return to the clinic for a follow-up visit. Patient educated on when it is appropriate to go to the emergency department.   Rosaline Bohr, NP-C

## 2024-02-10 LAB — CBC WITH DIFFERENTIAL/PLATELET
Basophils Absolute: 0.1 x10E3/uL (ref 0.0–0.2)
Basos: 1 %
EOS (ABSOLUTE): 0.1 x10E3/uL (ref 0.0–0.4)
Eos: 2 %
Hematocrit: 44.3 % (ref 37.5–51.0)
Hemoglobin: 14.1 g/dL (ref 13.0–17.7)
Immature Grans (Abs): 0 x10E3/uL (ref 0.0–0.1)
Immature Granulocytes: 0 %
Lymphocytes Absolute: 2 x10E3/uL (ref 0.7–3.1)
Lymphs: 32 %
MCH: 29 pg (ref 26.6–33.0)
MCHC: 31.8 g/dL (ref 31.5–35.7)
MCV: 91 fL (ref 79–97)
Monocytes Absolute: 0.5 x10E3/uL (ref 0.1–0.9)
Monocytes: 8 %
Neutrophils Absolute: 3.7 x10E3/uL (ref 1.4–7.0)
Neutrophils: 57 %
Platelets: 381 x10E3/uL (ref 150–450)
RBC: 4.86 x10E6/uL (ref 4.14–5.80)
RDW: 12.7 % (ref 11.6–15.4)
WBC: 6.4 x10E3/uL (ref 3.4–10.8)

## 2024-02-11 ENCOUNTER — Other Ambulatory Visit (HOSPITAL_COMMUNITY): Payer: Self-pay | Admitting: Emergency Medicine

## 2024-02-11 LAB — CMP14+EGFR
ALT: 22 IU/L (ref 0–44)
AST: 20 IU/L (ref 0–40)
Albumin: 4.4 g/dL (ref 3.9–4.9)
Alkaline Phosphatase: 89 IU/L (ref 44–121)
BUN/Creatinine Ratio: 15 (ref 10–24)
BUN: 17 mg/dL (ref 8–27)
Bilirubin Total: 0.4 mg/dL (ref 0.0–1.2)
CO2: 29 mmol/L (ref 20–29)
Calcium: 10.1 mg/dL (ref 8.6–10.2)
Chloride: 93 mmol/L — ABNORMAL LOW (ref 96–106)
Creatinine, Ser: 1.16 mg/dL (ref 0.76–1.27)
Globulin, Total: 3.8 g/dL (ref 1.5–4.5)
Glucose: 226 mg/dL — ABNORMAL HIGH (ref 70–99)
Potassium: 4.5 mmol/L (ref 3.5–5.2)
Sodium: 138 mmol/L (ref 134–144)
Total Protein: 8.2 g/dL (ref 6.0–8.5)
eGFR: 72 mL/min/1.73 (ref >59)

## 2024-02-11 LAB — LIPID PANEL
Chol/HDL Ratio: 2.8 ratio (ref 0.0–5.0)
Cholesterol, Total: 127 mg/dL (ref 100–199)
HDL: 45 mg/dL (ref >39)
LDL Chol Calc (NIH): 62 mg/dL (ref 0–99)
Triglycerides: 112 mg/dL (ref 0–149)
VLDL Cholesterol Cal: 20 mg/dL (ref 5–40)

## 2024-02-11 LAB — MICROALBUMIN / CREATININE URINE RATIO
Creatinine, Urine: 70.1 mg/dL
Microalb/Creat Ratio: 129 mg/g{creat} — AB (ref 0–29)
Microalbumin, Urine: 90.1 ug/mL

## 2024-02-11 NOTE — Progress Notes (Signed)
 Paramedicine Encounter    [name], male    DOB: 1961/01/04, 63 y.o.   MRN: [id number]   Complaints NONE  Assessment A&O x 4, skin W&D w/ good color.  Denies chest pain or SOB.  Lung sounds clear throughout.  Compliance with meds YES  Pill box filled x 2  weeks  Refills needed Triamterene   Meds changes since last visit NONE    Social changes NONE   BP 130/78 (BP Location: Left Arm, Patient Position: Sitting, Cuff Size: Normal)   Pulse 88   Resp 16   SpO2 93%   ACTION: Home visit completed  Mary Claudene Kennel [phone] 02/11/24  Patient Care Team: Celestia Rosaline SQUIBB, NP as PCP - General (Internal Medicine)  Patient Active Problem List   Diagnosis Date Noted   Pain due to onychomycosis of toenails of both feet 08/06/2021   Diabetic neuropathy (HCC) 08/06/2021   Bilateral sensorineural hearing loss 03/04/2016   Cognitive deficit, post-stroke 11/14/2014   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) 07/08/2014   Homonymous hemianopsia following cerebrovascular accident 06/06/2014   Acute left hemiparesis (HCC) 05/04/2014   Stroke (HCC) 04/30/2014   Spastic hemiplegia affecting dominant side (HCC) 10/01/2013   Aphasia as late effect of cerebrovascular accident 10/01/2013   Alterations of sensations, late effect of cerebrovascular disease(438.6) 10/01/2013   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   Dehydration 06/07/2013   Hypokalemia 06/07/2013   Dyslipidemia 06/07/2013   Cerebral infarction (HCC) 06/06/2013   Diabetes mellitus type 2, uncontrolled 06/06/2013   Rhabdomyolysis 06/06/2013    Current Outpatient Medications:    amLODipine  (NORVASC ) 10 MG tablet, TAKE 1 TABLET BY MOUTH DAILY, Disp: 100 tablet, Rfl: 1   ASPIRIN  LOW DOSE 81 MG EC tablet, Take 81 mg by mouth daily., Disp: , Rfl:    atorvastatin  (LIPITOR) 20 MG tablet, Take 1 tablet (20 mg total) by mouth daily., Disp: 90 tablet, Rfl: 1    insulin  glargine (LANTUS  SOLOSTAR) 100 UNIT/ML Solostar Pen, Inject 40 Units into the skin daily., Disp: 45 mL, Rfl: 2   insulin  lispro (HUMALOG  KWIKPEN) 100 UNIT/ML KwikPen, Inject 14 Units into the skin 3 (three) times daily., Disp: 15 mL, Rfl: 3   Insulin  Pen Needle (PEN NEEDLES) 32G X 5 MM MISC, Patient to use needles with pen as needed for injection of insulin , Disp: 100 each, Rfl: 2   triamterene -hydrochlorothiazide  (MAXZIDE-25) 37.5-25 MG tablet, Take 1 tablet by mouth daily., Disp: 90 tablet, Rfl: 1 No Known Allergies   Social History   Socioeconomic History   Marital status: Divorced    Spouse name: Not on file   Number of children: 2   Years of education: 12   Highest education level: Not on file  Occupational History   Not on file  Tobacco Use   Smoking status: Never   Smokeless tobacco: Never  Vaping Use   Vaping status: Never Used  Substance and Sexual Activity   Alcohol use: No    Alcohol/week: 0.0 standard drinks of alcohol   Drug use: No   Sexual activity: Not Currently  Other Topics Concern   Not on file  Social History Narrative   Patient is single and has 2 children.   Patient is right handed.   Patient has hs education.   Patient drinks diet caffeine free sodas daily.   Social Drivers of Health   Financial Resource Strain: Medium Risk (10/24/2023)   Overall Financial Resource Strain (CARDIA)  Difficulty of Paying Living Expenses: Somewhat hard  Food Insecurity: Food Insecurity Present (10/24/2023)   Hunger Vital Sign    Worried About Running Out of Food in the Last Year: Sometimes true    Ran Out of Food in the Last Year: Sometimes true  Transportation Needs: Unmet Transportation Needs (10/24/2023)   PRAPARE - Administrator, Civil Service (Medical): Yes    Lack of Transportation (Non-Medical): No  Physical Activity: Inactive (09/17/2023)   Exercise Vital Sign    Days of Exercise per Week: 0 days    Minutes of Exercise per Session: 0 min   Stress: No Stress Concern Present (09/17/2023)   [employer] of Occupational Health - Occupational Stress Questionnaire    Feeling of Stress : Not at all  Social Connections: Moderately Isolated (09/17/2023)   Social Connection and Isolation Panel    Frequency of Communication with Friends and Family: More than three times a week    Frequency of Social Gatherings with Friends and Family: More than three times a week    Attends Religious Services: More than 4 times per year    Active Member of [employer] or Organizations: No    Attends Banker Meetings: Never    Marital Status: Divorced  Catering manager Violence: Not At Risk (09/17/2023)   Humiliation, Afraid, Rape, and Kick questionnaire    Fear of Current or Ex-Partner: No    Emotionally Abused: No    Physically Abused: No    Sexually Abused: No    Physical Exam      Future Appointments  Date Time Provider Department Center  05/11/2024  2:50 PM Celestia Rosaline SQUIBB, NP [facility] None

## 2024-02-15 ENCOUNTER — Ambulatory Visit: Payer: Self-pay | Admitting: Primary Care

## 2024-02-15 ENCOUNTER — Other Ambulatory Visit: Payer: Self-pay | Admitting: Family Medicine

## 2024-02-15 DIAGNOSIS — Z794 Long term (current) use of insulin: Secondary | ICD-10-CM

## 2024-02-23 ENCOUNTER — Encounter (INDEPENDENT_AMBULATORY_CARE_PROVIDER_SITE_OTHER): Payer: Self-pay

## 2024-02-26 ENCOUNTER — Other Ambulatory Visit (HOSPITAL_COMMUNITY): Payer: Self-pay | Admitting: Emergency Medicine

## 2024-02-26 NOTE — Progress Notes (Unsigned)
 Paramedicine Encounter    [name], male    DOB: 1960-09-05, 63 y.o.   MRN: [id number]   Complaints***  Assessment***  Compliance with meds***  Pill box filled***  Refills needed***  Meds changes since last visit***    Social changes***   There were no vitals taken for this visit. Weight yesterday-*** Last visit weight-***  ACTION: {Paramed Action:[phone]}  [name], EMT-Paramedic [phone] 02/26/24  Patient Care Team: Celestia Rosaline SQUIBB, NP as PCP - General (Internal Medicine)  Patient Active Problem List   Diagnosis Date Noted  . Pain due to onychomycosis of toenails of both feet 08/06/2021  . Diabetic neuropathy (HCC) 08/06/2021  . Bilateral sensorineural hearing loss 03/04/2016  . Cognitive deficit, post-stroke 11/14/2014  . 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 Homonymous hemianopsia following cerebrovascular accident 06/06/2014  . Acute left hemiparesis (HCC) 05/04/2014  . Stroke (HCC) 04/30/2014  . Spastic hemiplegia affecting dominant side (HCC) 10/01/2013  . Aphasia as late effect of cerebrovascular accident 10/01/2013  . 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 Dehydration 06/07/2013  . Hypokalemia 06/07/2013  . Dyslipidemia 06/07/2013  . Cerebral infarction (HCC) 06/06/2013  . Diabetes mellitus type 2, uncontrolled 06/06/2013  . Rhabdomyolysis 06/06/2013    Current Outpatient Medications:  .  amLODipine  (NORVASC ) 10 MG tablet, TAKE 1 TABLET BY MOUTH DAILY, Disp: 100 tablet, Rfl: 1 .  ASPIRIN  LOW DOSE 81 MG EC tablet, Take 81 mg by mouth daily., Disp: , Rfl:  .  atorvastatin  (LIPITOR) 20 MG tablet, Take 1 tablet (20 mg total) by mouth daily., Disp: 90 tablet, Rfl: 1 .  Continuous Glucose Sensor (FREESTYLE LIBRE 2 SENSOR) MISC, CHECK BLOOD SUGAR 3 TIMES DAILY, Disp: 8 each, Rfl:  2 .  insulin  glargine (LANTUS  SOLOSTAR) 100 UNIT/ML Solostar Pen, Inject 40 Units into the skin daily., Disp: 45 mL, Rfl: 2 .  insulin  lispro (HUMALOG  KWIKPEN) 100 UNIT/ML KwikPen, Inject 14 Units into the skin 3 (three) times daily., Disp: 15 mL, Rfl: 3 .  Insulin  Pen Needle (PEN NEEDLES) 32G X 5 MM MISC, Patient to use needles with pen as needed for injection of insulin , Disp: 100 each, Rfl: 2 .  triamterene -hydrochlorothiazide  (MAXZIDE-25) 37.5-25 MG tablet, Take 1 tablet by mouth daily., Disp: 90 tablet, Rfl: 1 No Known Allergies   Social History   Socioeconomic History  . Marital status: Divorced    Spouse name: Not on file  . Number of children: 2  . Years of education: 48  . Highest education level: Not on file  Occupational History  . Not on file  Tobacco Use  . Smoking status: Never  . Smokeless tobacco: Never  Vaping Use  . Vaping status: Never Used  Substance and Sexual Activity  . Alcohol use: No    Alcohol/week: 0.0 standard drinks of alcohol  . Drug use: No  . Sexual activity: Not Currently  Other Topics Concern  . Not on file  Social History Narrative   Patient is single and has 2 children.   Patient is right handed.   Patient has hs education.   Patient drinks diet caffeine free sodas daily.   Social Drivers of Health   Financial Resource Strain: Medium Risk (10/24/2023)   Overall Financial Resource Strain (CARDIA)   . Difficulty of Paying Living Expenses: Somewhat hard  Food Insecurity: Food Insecurity Present (10/24/2023)   Hunger Vital Sign   .  Worried About Programme researcher, broadcasting/film/video in the Last Year: Sometimes true   . Ran Out of Food in the Last Year: Sometimes true  Transportation Needs: Unmet Transportation Needs (10/24/2023)   PRAPARE - Transportation   . Lack of Transportation (Medical): Yes   . Lack of Transportation (Non-Medical): No  Physical Activity: Inactive (09/17/2023)   Exercise Vital Sign   . Days of Exercise per Week: 0 days   . Minutes of  Exercise per Session: 0 min  Stress: No Stress Concern Present (09/17/2023)   [employer] of Occupational Health - Occupational Stress Questionnaire   . Feeling of Stress : Not at all  Social Connections: Moderately Isolated (09/17/2023)   Social Connection and Isolation Panel   . Frequency of Communication with Friends and Family: More than three times a week   . Frequency of Social Gatherings with Friends and Family: More than three times a week   . Attends Religious Services: More than 4 times per year   . Active Member of Clubs or Organizations: No   . Attends Banker Meetings: Never   . Marital Status: Divorced  Catering manager Violence: Not At Risk (09/17/2023)   Humiliation, Afraid, Rape, and Kick questionnaire   . Fear of Current or Ex-Partner: No   . Emotionally Abused: No   . Physically Abused: No   . Sexually Abused: No    Physical Exam      Future Appointments  Date Time Provider Department Center  05/11/2024  2:50 PM Celestia Rosaline SQUIBB, NP [facility] None

## 2024-03-09 ENCOUNTER — Other Ambulatory Visit (HOSPITAL_COMMUNITY): Payer: Self-pay | Admitting: Emergency Medicine

## 2024-03-09 NOTE — Progress Notes (Signed)
 Paramedicine Encounter    [name], male    DOB: November 03, 1960, 63 y.o.   MRN: [id number]   Complaints NONE  Assessment A&O x 4, skin W&D w/ good color.  Denies chest pain or SOB.  Lung sounds clear and equal bilat.  No peripheral edema noted.  Compliance with meds YES  Pill box filled x 2 weeks  Refills needed NONE  Meds changes since last visit NONE    Social changes NONE   BP (!) 140/70 (BP Location: Left Arm, Patient Position: Sitting)   Pulse 86   SpO2 99%  Weight yesterday- Last visit weight-  ACTION: Next visit planned for 03/19/24 @ 1:00  [name], EMT-Paramedic [phone]  03/09/24  Patient Care Team: Celestia Rosaline SQUIBB, NP as PCP - General (Internal Medicine)  Patient Active Problem List   Diagnosis Date Noted  . Pain due to onychomycosis of toenails of both feet 08/06/2021  . Diabetic neuropathy (HCC) 08/06/2021  . Bilateral sensorineural hearing loss 03/04/2016  . Cognitive deficit, post-stroke 11/14/2014  . 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 Homonymous hemianopsia following cerebrovascular accident 06/06/2014  . Acute left hemiparesis (HCC) 05/04/2014  . Stroke (HCC) 04/30/2014  . Spastic hemiplegia affecting dominant side (HCC) 10/01/2013  . Aphasia as late effect of cerebrovascular accident 10/01/2013  . 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 Dehydration 06/07/2013  . Hypokalemia 06/07/2013  . Dyslipidemia 06/07/2013  . Cerebral infarction (HCC) 06/06/2013  . Diabetes mellitus type 2, uncontrolled 06/06/2013  . Rhabdomyolysis 06/06/2013    Current Outpatient Medications:  .  amLODipine  (NORVASC ) 10 MG tablet, TAKE 1 TABLET BY MOUTH DAILY, Disp: 100 tablet, Rfl: 1 .  ASPIRIN  LOW DOSE 81 MG EC tablet, Take 81 mg by mouth daily., Disp: , Rfl:  .  atorvastatin  (LIPITOR) 20 MG  tablet, Take 1 tablet (20 mg total) by mouth daily., Disp: 90 tablet, Rfl: 1 .  Continuous Glucose Sensor (FREESTYLE LIBRE 2 SENSOR) MISC, CHECK BLOOD SUGAR 3 TIMES DAILY, Disp: 8 each, Rfl: 2 .  insulin  glargine (LANTUS  SOLOSTAR) 100 UNIT/ML Solostar Pen, Inject 40 Units into the skin daily., Disp: 45 mL, Rfl: 2 .  insulin  lispro (HUMALOG  KWIKPEN) 100 UNIT/ML KwikPen, Inject 14 Units into the skin 3 (three) times daily., Disp: 15 mL, Rfl: 3 .  Insulin  Pen Needle (PEN NEEDLES) 32G X 5 MM MISC, Patient to use needles with pen as needed for injection of insulin , Disp: 100 each, Rfl: 2 .  triamterene -hydrochlorothiazide  (MAXZIDE-25) 37.5-25 MG tablet, Take 1 tablet by mouth daily., Disp: 90 tablet, Rfl: 1 No Known Allergies   Social History   Socioeconomic History  . Marital status: Divorced    Spouse name: Not on file  . Number of children: 2  . Years of education: 42  . Highest education level: Not on file  Occupational History  . Not on file  Tobacco Use  . Smoking status: Never  . Smokeless tobacco: Never  Vaping Use  . Vaping status: Never Used  Substance and Sexual Activity  . Alcohol use: No    Alcohol/week: 0.0 standard drinks of alcohol  . Drug use: No  . Sexual activity: Not Currently  Other Topics Concern  . Not on file  Social History Narrative   Patient is single and has 2 children.   Patient is right handed.   Patient has hs education.  Patient drinks diet caffeine free sodas daily.   Social Drivers of Health   Financial Resource Strain: Medium Risk (10/24/2023)   Overall Financial Resource Strain (CARDIA)   . Difficulty of Paying Living Expenses: Somewhat hard  Food Insecurity: Food Insecurity Present (10/24/2023)   Hunger Vital Sign   . Worried About Programme researcher, broadcasting/film/video in the Last Year: Sometimes true   . Ran Out of Food in the Last Year: Sometimes true  Transportation Needs: Unmet Transportation Needs (10/24/2023)   PRAPARE - Transportation   . Lack of  Transportation (Medical): Yes   . Lack of Transportation (Non-Medical): No  Physical Activity: Inactive (09/17/2023)   Exercise Vital Sign   . Days of Exercise per Week: 0 days   . Minutes of Exercise per Session: 0 min  Stress: No Stress Concern Present (09/17/2023)   [employer] of Occupational Health - Occupational Stress Questionnaire   . Feeling of Stress : Not at all  Social Connections: Moderately Isolated (09/17/2023)   Social Connection and Isolation Panel   . Frequency of Communication with Friends and Family: More than three times a week   . Frequency of Social Gatherings with Friends and Family: More than three times a week   . Attends Religious Services: More than 4 times per year   . Active Member of Clubs or Organizations: No   . Attends Banker Meetings: Never   . Marital Status: Divorced  Catering manager Violence: Not At Risk (09/17/2023)   Humiliation, Afraid, Rape, and Kick questionnaire   . Fear of Current or Ex-Partner: No   . Emotionally Abused: No   . Physically Abused: No   . Sexually Abused: No    Physical Exam      Future Appointments  Date Time Provider Department Center  05/11/2024  2:50 PM Celestia Rosaline SQUIBB, NP [facility] None

## 2024-03-10 DIAGNOSIS — R531 Weakness: Secondary | ICD-10-CM | POA: Diagnosis not present

## 2024-03-10 DIAGNOSIS — I639 Cerebral infarction, unspecified: Secondary | ICD-10-CM | POA: Diagnosis not present

## 2024-04-08 ENCOUNTER — Other Ambulatory Visit (HOSPITAL_COMMUNITY): Payer: Self-pay | Admitting: Emergency Medicine

## 2024-04-08 NOTE — Progress Notes (Signed)
 Paramedicine Encounter    [name], male    DOB: 1961/05/13, 63 y.o.   MRN: [id number]   Complaints NONE  Assessment A&O x 4, skin W&D w/ good color.  Denies chest pain or SOB.  Lung sounds clear and equal bilat. No peripheral edema noted.  Compliance with meds YES  Pill box filled x 3 weeks  Refills needed Meds ordered from [facility].  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) 07/08/2014   Homonymous hemianopsia following cerebrovascular accident 06/06/2014   Acute left hemiparesis (HCC) 05/04/2014   Stroke (HCC) 04/30/2014   Spastic hemiplegia affecting dominant side (HCC) 10/01/2013   Aphasia as late effect of cerebrovascular accident 10/01/2013   Alterations of sensations, late effect of cerebrovascular disease(438.6) 10/01/2013   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   Dehydration 06/07/2013   Hypokalemia 06/07/2013   Dyslipidemia 06/07/2013   Cerebral infarction (HCC) 06/06/2013   Diabetes mellitus type 2, uncontrolled 06/06/2013   Rhabdomyolysis 06/06/2013    Current Outpatient Medications:    amLODipine  (NORVASC ) 10 MG tablet, TAKE 1 TABLET BY MOUTH DAILY, Disp: 100 tablet, Rfl: 1   ASPIRIN  LOW DOSE 81 MG EC tablet, Take 81 mg by mouth daily., Disp: , Rfl:    atorvastatin  (LIPITOR) 20 MG tablet, Take 1 tablet (20 mg  total) by mouth daily., Disp: 90 tablet, Rfl: 1   triamterene -hydrochlorothiazide  (MAXZIDE-25) 37.5-25 MG tablet, Take 1 tablet by mouth daily., Disp: 90 tablet, Rfl: 1   Continuous Glucose Sensor (FREESTYLE LIBRE 2 SENSOR) MISC, CHECK BLOOD SUGAR 3 TIMES DAILY, Disp: 8 each, Rfl: 2   insulin  glargine (LANTUS  SOLOSTAR) 100 UNIT/ML Solostar Pen, Inject 40 Units into the skin daily., Disp: 45 mL, Rfl: 2   insulin  lispro (HUMALOG  KWIKPEN) 100 UNIT/ML KwikPen, Inject 14 Units into the skin 3 (three) times daily., Disp: 15 mL, Rfl: 3   Insulin  Pen Needle (PEN NEEDLES) 32G X 5 MM MISC, Patient to use needles with pen as needed for injection of insulin , Disp: 100 each, Rfl: 2 No Known Allergies   Social History   Socioeconomic History   Marital status: Divorced    Spouse name: Not on file   Number of children: 2   Years of education: 12   Highest education level: Not on file  Occupational History   Not on file  Tobacco Use   Smoking status: Never   Smokeless tobacco: Never  Vaping Use   Vaping status: Never Used  Substance and Sexual Activity   Alcohol use: No    Alcohol/week: 0.0 standard drinks of alcohol   Drug use: No   Sexual activity: Not Currently  Other Topics Concern   Not on file  Social History Narrative   Patient is single and has 2 children.   Patient is right handed.   Patient has hs education.  Patient drinks diet caffeine free sodas daily.   Social Drivers of Health   Financial Resource Strain: Medium Risk (10/24/2023)   Overall Financial Resource Strain (CARDIA)    Difficulty of Paying Living Expenses: Somewhat hard  Food Insecurity: Food Insecurity Present (10/24/2023)   Hunger Vital Sign    Worried About Running Out of Food in the Last Year: Sometimes true    Ran Out of Food in the Last Year: Sometimes true  Transportation Needs: Unmet Transportation Needs (10/24/2023)   PRAPARE - Administrator, Civil Service (Medical): Yes    Lack of Transportation  (Non-Medical): No  Physical Activity: Inactive (09/17/2023)   Exercise Vital Sign    Days of Exercise per Week: 0 days    Minutes of Exercise per Session: 0 min  Stress: No Stress Concern Present (09/17/2023)   [employer] of Occupational Health - Occupational Stress Questionnaire    Feeling of Stress : Not at all  Social Connections: Moderately Isolated (09/17/2023)   Social Connection and Isolation Panel    Frequency of Communication with Friends and Family: More than three times a week    Frequency of Social Gatherings with Friends and Family: More than three times a week    Attends Religious Services: More than 4 times per year    Active Member of [employer] or Organizations: No    Attends Banker Meetings: Never    Marital Status: Divorced  Catering manager Violence: Not At Risk (09/17/2023)   Humiliation, Afraid, Rape, and Kick questionnaire    Fear of Current or Ex-Partner: No    Emotionally Abused: No    Physically Abused: No    Sexually Abused: No    Physical Exam      Future Appointments  Date Time Provider Department Center  05/11/2024  2:50 PM Celestia Rosaline SQUIBB, NP [facility] [place]

## 2024-04-10 DIAGNOSIS — I639 Cerebral infarction, unspecified: Secondary | ICD-10-CM | POA: Diagnosis not present

## 2024-04-23 ENCOUNTER — Other Ambulatory Visit (HOSPITAL_COMMUNITY): Payer: Self-pay | Admitting: Emergency Medicine

## 2024-04-23 NOTE — Progress Notes (Signed)
 Paramedicine Encounter    [name], male    DOB: [birth year], 62 y.o.   MRN: [id number]   Complaints NONE  Assessment A&O x 4, skin W&D w/ good color.  Pt reports to be doing well.  Denies chest pain or SOB.  Lung sounds clear and equal bilat.  No edema noted.  Assisted pt applying his [facility] sensor.    Compliance with meds YES  Pill box filled x 3 weeks  Refills needed NONE  Meds changes since last visit NONE    Social changes NONE   BP (!) 148/80 (BP Location: Left Arm, Patient Position: Sitting, Cuff Size: Normal)   Pulse 99   Resp 16   SpO2 97%  Weight yesterday- not taken Last visit weight-not taken  During this visit.  Pt asked if I could go online to help find him a new bank.  I did so and helped him set up an appointment w/ [employer] 10/8 @ 3:00.  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) 07/08/2014   Homonymous hemianopsia following cerebrovascular accident 06/06/2014   Acute left hemiparesis (HCC) 05/04/2014   Stroke (HCC) 04/30/2014   Spastic hemiplegia affecting dominant side (HCC) 10/01/2013   Aphasia as late effect of cerebrovascular accident 10/01/2013   Alterations of sensations, late effect of cerebrovascular disease(438.6) 10/01/2013   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   Dehydration 06/07/2013   Hypokalemia 06/07/2013   Dyslipidemia 06/07/2013   Cerebral infarction (HCC) 06/06/2013   Diabetes mellitus type 2, uncontrolled 06/06/2013   Rhabdomyolysis 06/06/2013     Current Outpatient Medications:    amLODipine  (NORVASC ) 10 MG tablet, TAKE 1 TABLET BY MOUTH DAILY, Disp: 100 tablet, Rfl: 1   ASPIRIN  LOW DOSE 81 MG EC tablet, Take 81 mg by mouth daily., Disp: , Rfl:    atorvastatin  (LIPITOR) 20 MG tablet, Take 1 tablet (20 mg total) by mouth daily., Disp: 90 tablet, Rfl: 1   Continuous Glucose Sensor (FREESTYLE LIBRE 2 SENSOR) MISC, CHECK BLOOD SUGAR 3 TIMES DAILY, Disp: 8 each, Rfl: 2   insulin  glargine (LANTUS  SOLOSTAR) 100 UNIT/ML Solostar Pen, Inject 40 Units into the skin daily., Disp: 45 mL, Rfl: 2   insulin  lispro (HUMALOG  KWIKPEN) 100 UNIT/ML KwikPen, Inject 14 Units into the skin 3 (three) times daily., Disp: 15 mL, Rfl: 3   Insulin  Pen Needle (PEN NEEDLES) 32G X 5 MM MISC, Patient to use needles with pen as needed for injection of insulin , Disp: 100 each, Rfl: 2   triamterene -hydrochlorothiazide  (MAXZIDE-25) 37.5-25 MG tablet, Take 1 tablet by mouth daily., Disp: 90 tablet, Rfl: 1 No Known Allergies   Social History   Socioeconomic History   Marital status: Divorced    Spouse name: Not on file   Number of children: 2   Years of education: 12   Highest education level: Not on file  Occupational History   Not on file  Tobacco Use   Smoking status: Never   Smokeless tobacco: Never  Vaping Use   Vaping status: Never Used  Substance and Sexual Activity   Alcohol use: No  Alcohol/week: 0.0 standard drinks of alcohol   Drug use: No   Sexual activity: Not Currently  Other Topics Concern   Not on file  Social History Narrative   Patient is single and has 2 children.   Patient is right handed.   Patient has hs education.   Patient drinks diet caffeine free sodas daily.   Social Drivers of Health   Financial Resource Strain: Medium Risk (10/24/2023)   Overall Financial Resource Strain (CARDIA)    Difficulty of Paying Living Expenses: Somewhat hard  Food Insecurity: Food Insecurity Present (10/24/2023)   Hunger Vital Sign     Worried About Running Out of Food in the Last Year: Sometimes true    Ran Out of Food in the Last Year: Sometimes true  Transportation Needs: Unmet Transportation Needs (10/24/2023)   PRAPARE - Administrator, Civil Service (Medical): Yes    Lack of Transportation (Non-Medical): No  Physical Activity: Inactive (09/17/2023)   Exercise Vital Sign    Days of Exercise per Week: 0 days    Minutes of Exercise per Session: 0 min  Stress: No Stress Concern Present (09/17/2023)   [employer] of Occupational Health - Occupational Stress Questionnaire    Feeling of Stress : Not at all  Social Connections: Moderately Isolated (09/17/2023)   Social Connection and Isolation Panel    Frequency of Communication with Friends and Family: More than three times a week    Frequency of Social Gatherings with Friends and Family: More than three times a week    Attends Religious Services: More than 4 times per year    Active Member of [employer] or Organizations: No    Attends Banker Meetings: Never    Marital Status: Divorced  Catering manager Violence: Not At Risk (09/17/2023)   Humiliation, Afraid, Rape, and Kick questionnaire    Fear of Current or Ex-Partner: No    Emotionally Abused: No    Physically Abused: No    Sexually Abused: No    Physical Exam      Future Appointments  Date Time Provider Department Center  05/11/2024  2:50 PM Celestia Rosaline SQUIBB, NP [facility] [place]

## 2024-05-07 ENCOUNTER — Other Ambulatory Visit (HOSPITAL_COMMUNITY): Payer: Self-pay | Admitting: Emergency Medicine

## 2024-05-10 DIAGNOSIS — R531 Weakness: Secondary | ICD-10-CM | POA: Diagnosis not present

## 2024-05-10 DIAGNOSIS — I639 Cerebral infarction, unspecified: Secondary | ICD-10-CM | POA: Diagnosis not present

## 2024-05-11 ENCOUNTER — Ambulatory Visit (INDEPENDENT_AMBULATORY_CARE_PROVIDER_SITE_OTHER): Admitting: Primary Care

## 2024-05-11 ENCOUNTER — Other Ambulatory Visit (INDEPENDENT_AMBULATORY_CARE_PROVIDER_SITE_OTHER): Payer: Self-pay | Admitting: Primary Care

## 2024-05-11 DIAGNOSIS — Z760 Encounter for issue of repeat prescription: Secondary | ICD-10-CM

## 2024-05-11 DIAGNOSIS — I10 Essential (primary) hypertension: Secondary | ICD-10-CM

## 2024-05-11 DIAGNOSIS — Z131 Encounter for screening for diabetes mellitus: Secondary | ICD-10-CM

## 2024-05-11 DIAGNOSIS — E119 Type 2 diabetes mellitus without complications: Secondary | ICD-10-CM

## 2024-05-13 NOTE — Telephone Encounter (Signed)
 Will forward to provider

## 2024-05-18 ENCOUNTER — Ambulatory Visit (INDEPENDENT_AMBULATORY_CARE_PROVIDER_SITE_OTHER): Admitting: Primary Care

## 2024-05-18 NOTE — Progress Notes (Signed)
 Mr. Brindle reports to be feeling well.  He has been compliant w/ all meds.  He denies chest pain or SOB.  Lung sounds clear bilat.  Blood pressure is elevated today.  He denies headache or visual disturbances. Encouraged him to watch his diet a stay away from fried foods and excess salt. Med box is reconciled x 3 weeks.   Assisted pt w/ his Freestyle sensor and reader.  He had a bit of trouble finding it and I encouraged him to keep it in a central location and he advised he would do same.    [name], EMT-Paramedic [phone] 05/10/24

## 2024-06-01 ENCOUNTER — Other Ambulatory Visit (HOSPITAL_COMMUNITY): Payer: Self-pay | Admitting: Emergency Medicine

## 2024-06-01 NOTE — Progress Notes (Signed)
 Paramedicine Encounter    [name], male    DOB: Aug 12, 1960, 63 y.o.   MRN: [id number]   Complaints NONE  Assessment A&O x 4, skin W&D w/ good color.  Pt. Denies chest pain or SOB.  He has been compliant w/ his meds but has not yet taken todays meds- BP is elevated.  He states he hasn't taken his meds because he hasn't eaten yet today.  Advised him he should eat and go ahead and take his meds and he advised he will do so. Also assisted Adam Callahan w/ applying his Freestyle sensor to his rt upper arm w/o incident. He did inquire about Me[employer] Wheels and [employer].  I advised him that I received a follow up call from [employer] and called her back but I don't think she's working today due to the [employer].  Will reach back out to her tomorrow.  Compliance with meds YES  Pill box filled YES   Refills needed NONE  Meds changes since last visit NONE    Social changes NONE   BP (!) 160/90 (BP Location: Right Arm, Patient Position: Sitting, Cuff Size: Normal)   Pulse 74   SpO2 97%  Weight yesterday- Last visit weight-   ACTION: Home visit completed  Adam Callahan [phone] 06/01/24  Patient Care Team: Celestia Rosaline SQUIBB, NP as PCP - General (Internal Medicine)  Patient Active Problem List   Diagnosis Date Noted   Pain due to onychomycosis of toenails of both feet 08/06/2021   Diabetic neuropathy (HCC) 08/06/2021   Bilateral sensorineural hearing loss 03/04/2016   Cognitive deficit, post-stroke 11/14/2014   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) 07/08/2014   Homonymous hemianopsia following cerebrovascular accident 06/06/2014   Acute left hemiparesis (HCC) 05/04/2014   Stroke (HCC) 04/30/2014   Spastic hemiplegia affecting dominant side (HCC) 10/01/2013   Aphasia as late effect of cerebrovascular accident 10/01/2013   Alterations of sensations, late effect of cerebrovascular  disease(438.6) 10/01/2013   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   Dehydration 06/07/2013   Hypokalemia 06/07/2013   Dyslipidemia 06/07/2013   Cerebral infarction (HCC) 06/06/2013   Diabetes mellitus type 2, uncontrolled 06/06/2013   Rhabdomyolysis 06/06/2013    Current Outpatient Medications:    amLODipine  (NORVASC ) 10 MG tablet, TAKE 1 TABLET BY MOUTH DAILY, Disp: 100 tablet, Rfl: 1   ASPIRIN  LOW DOSE 81 MG EC tablet, Take 81 mg by mouth daily., Disp: , Rfl:    atorvastatin  (LIPITOR) 20 MG tablet, Take 1 tablet (20 mg total) by mouth daily., Disp: 90 tablet, Rfl: 1   Continuous Glucose Sensor (FREESTYLE LIBRE 2 SENSOR) MISC, CHECK BLOOD SUGAR 3 TIMES DAILY, Disp: 8 each, Rfl: 2   insulin  glargine (LANTUS  SOLOSTAR) 100 UNIT/ML Solostar Pen, Inject 40 Units into the skin daily., Disp: 45 mL, Rfl: 2   insulin  lispro (HUMALOG  KWIKPEN) 100 UNIT/ML KwikPen, Inject 14 Units into the skin 3 (three) times daily., Disp: 15 mL, Rfl: 3   Insulin  Pen Needle (PEN NEEDLES) 32G X 5 MM MISC, Patient to use needles with pen as needed for injection of insulin , Disp: 100 each, Rfl: 2   triamterene -hydrochlorothiazide  (MAXZIDE-25) 37.5-25 MG tablet, Take 1 tablet by mouth daily., Disp: 90 tablet, Rfl: 1 No Known Allergies   Social History   Socioeconomic History   Marital status: Divorced    Spouse name: Not on file   Number of children: 2   Years of education: 58  Highest education level: Not on file  Occupational History   Not on file  Tobacco Use   Smoking status: Never   Smokeless tobacco: Never  Vaping Use   Vaping status: Never Used  Substance and Sexual Activity   Alcohol use: No    Alcohol/week: 0.0 standard drinks of alcohol   Drug use: No   Sexual activity: Not Currently  Other Topics Concern   Not on file  Social History Narrative   Patient is single and has 2 children.   Patient is right handed.   Patient has hs education.   Patient drinks diet caffeine free sodas  daily.   Social Drivers of Health   Financial Resource Strain: Medium Risk (10/24/2023)   Overall Financial Resource Strain (CARDIA)    Difficulty of Paying Living Expenses: Somewhat hard  Food Insecurity: Food Insecurity Present (10/24/2023)   Hunger Vital Sign    Worried About Running Out of Food in the Last Year: Sometimes true    Ran Out of Food in the Last Year: Sometimes true  Transportation Needs: Unmet Transportation Needs (10/24/2023)   PRAPARE - Administrator, Civil Service (Medical): Yes    Lack of Transportation (Non-Medical): No  Physical Activity: Inactive (09/17/2023)   Exercise Vital Sign    Days of Exercise per Week: 0 days    Minutes of Exercise per Session: 0 min  Stress: No Stress Concern Present (09/17/2023)   [employer] of Occupational Health - Occupational Stress Questionnaire    Feeling of Stress : Not at all  Social Connections: Moderately Isolated (09/17/2023)   Social Connection and Isolation Panel    Frequency of Communication with Friends and Family: More than three times a week    Frequency of Social Gatherings with Friends and Family: More than three times a week    Attends Religious Services: More than 4 times per year    Active Member of [employer] or Organizations: No    Attends Banker Meetings: Never    Marital Status: Divorced  Catering Manager Violence: Not At Risk (09/17/2023)   Humiliation, Afraid, Rape, and Kick questionnaire    Fear of Current or Ex-Partner: No    Emotionally Abused: No    Physically Abused: No    Sexually Abused: No    Physical Exam      Future Appointments  Date Time Provider Department Center  06/14/2024  2:30 PM Celestia Rosaline SQUIBB, NP [facility] [place]

## 2024-06-02 ENCOUNTER — Telehealth: Payer: Self-pay

## 2024-06-02 NOTE — Telephone Encounter (Signed)
 I spoke to Adam Callahan, EMT/Community Paramed[facility] Program.  She explained her concerns about his difficulty obtaining food.  He has been referred to [facility] and [facility] and has been given a list of food pantries.   Dede said she will reach out to her contact at [employer] to inquire about putting him on the wait list.   She said he is able independently bath and dress himself  and is not in need of PCS. She will also speak to the patient about contacting family members to provide extra support for meals/ groceries.

## 2024-06-04 ENCOUNTER — Telehealth (HOSPITAL_COMMUNITY): Payer: Self-pay | Admitting: Emergency Medicine

## 2024-06-04 NOTE — Telephone Encounter (Signed)
 Called and spoke w/ Damien Limb w/ [facility] [place] and got Mr. Carchi put on the waiting list for [employer].  Currently there is an 18 month waiting list for this service.    [name], EMT-Paramedic [phone] 06/04/2024

## 2024-06-12 ENCOUNTER — Other Ambulatory Visit (INDEPENDENT_AMBULATORY_CARE_PROVIDER_SITE_OTHER): Payer: Self-pay | Admitting: Primary Care

## 2024-06-12 DIAGNOSIS — E119 Type 2 diabetes mellitus without complications: Secondary | ICD-10-CM

## 2024-06-12 DIAGNOSIS — Z760 Encounter for issue of repeat prescription: Secondary | ICD-10-CM

## 2024-06-12 DIAGNOSIS — I10 Essential (primary) hypertension: Secondary | ICD-10-CM

## 2024-06-12 DIAGNOSIS — E785 Hyperlipidemia, unspecified: Secondary | ICD-10-CM

## 2024-06-14 ENCOUNTER — Encounter (INDEPENDENT_AMBULATORY_CARE_PROVIDER_SITE_OTHER): Payer: Self-pay | Admitting: Primary Care

## 2024-06-14 ENCOUNTER — Ambulatory Visit (INDEPENDENT_AMBULATORY_CARE_PROVIDER_SITE_OTHER): Admitting: Primary Care

## 2024-06-14 VITALS — BP 147/80 | HR 99 | Resp 16 | Wt 174.8 lb

## 2024-06-14 DIAGNOSIS — I10 Essential (primary) hypertension: Secondary | ICD-10-CM

## 2024-06-14 DIAGNOSIS — E785 Hyperlipidemia, unspecified: Secondary | ICD-10-CM | POA: Diagnosis not present

## 2024-06-14 DIAGNOSIS — E119 Type 2 diabetes mellitus without complications: Secondary | ICD-10-CM

## 2024-06-14 LAB — POCT GLYCOSYLATED HEMOGLOBIN (HGB A1C): HbA1c, POC (controlled diabetic range): 6.3 % (ref 0.0–7.0)

## 2024-06-14 NOTE — Progress Notes (Signed)
 [facility]  Adam Callahan, is a 63 y.o. male  RDW:[id number]  FMW:[id number]  DOB - 1960/11/23  Chief Complaint  Patient presents with   Diabetes       Subjective:   Adam Callahan is a 63 y.o. male here today for a follow up visit.  He is very upset called [employer]  to bring him to his appointment he took him to the wrong place patient knows how to get in with his eyes closed and told him he can give him directions but he did not need them.  [employer] driver tells him he has to get out of his car. [employer] driver  calls the police.  Police proceeded to take patient to his appointment.  Explained will report him and  behavior do not pay for the transportation.  Blood pressure is elevated stressed to him before appointment.  Patient has No headache, No chest pain, No abdominal pain - No Nausea, No new weakness tingling or numbness, No Cough - shortness of breath Please follow-up by [facility]opor diabetes our clinical pharmacist A1c is down from 7.8-6.3 very pleased and proud. Denies polyuria, polydipsia, polyphasia or vision changes.  Does not check blood sugars at home.  Patient does admit to his left hand fingers has started to become numb and stiff when he first wakes up in the morning  No problems updated.  Comprehensive ROS Pertinent positive and negative noted in HPI   No Known Allergies  Past Medical History:  Diagnosis Date   Diabetes mellitus    Hypertension    Stroke [facility])     Current Outpatient Medications on File Prior to Visit  Medication Sig Dispense Refill   amLODipine  (NORVASC ) 10 MG tablet TAKE 1 TABLET BY MOUTH DAILY 100 tablet 1   ASPIRIN  LOW DOSE 81 MG EC tablet Take 81 mg by mouth daily.     atorvastatin  (LIPITOR) 20 MG tablet TAKE 1 TABLET BY MOUTH DAILY 100 tablet 1   Continuous Glucose Sensor (FREESTYLE LIBRE 2 SENSOR) MISC CHECK BLOOD SUGAR 3 TIMES DAILY 8 each 2   insulin  glargine (LANTUS  SOLOSTAR) 100 UNIT/ML Solostar Pen Inject 40 Units into the  skin daily. 45 mL 2   insulin  lispro (HUMALOG  KWIKPEN) 100 UNIT/ML KwikPen INJECT 14 UNITS INTO THE SKIN 3  TIMES DAILY 15 mL 3   Insulin  Pen Needle (PEN NEEDLES) 32G X 5 MM MISC Patient to use needles with pen as needed for injection of insulin  100 each 2   triamterene -hydrochlorothiazide  (MAXZIDE-25) 37.5-25 MG tablet TAKE 1 TABLET BY MOUTH DAILY 100 tablet 1   No current facility-administered medications on file prior to visit.   Health Maintenance  Topic Date Due   Eye exam for diabetics  Never done   Zoster (Shingles) Vaccine (1 of 2) Never done   Pneumococcal Vaccine for age over 15 (2 of 2 - PCV) 06/08/2014   COVID-19 Vaccine (3 - Pfizer risk series) 01/03/2020   Flu Shot  02/20/2024   Hemoglobin A1C  09/14/2024   Medicare Annual Wellness Visit  09/16/2024   Yearly kidney function blood test for diabetes  02/08/2025   Yearly kidney health urinalysis for diabetes  02/08/2025   Complete foot exam   02/08/2025   Cologuard (Stool DNA test)  05/15/2025   Hepatitis C Screening  Completed   HIV Screening  Completed   Hepatitis B Vaccine  Aged Out   HPV Vaccine  Aged Out   Meningitis B Vaccine  Aged Out   DTaP/Tdap/Td vaccine  Discontinued   Colon Cancer Screening  Discontinued    Objective:   Vitals:   06/14/24 1420  BP: (!) 147/80  Pulse: 99  Resp: 16  SpO2: 96%  Weight: 174 lb 12.8 oz (79.3 kg)   BP Readings from Last 3 Encounters:  06/16/24 130/80  06/14/24 (!) 147/80  06/01/24 (!) 160/90      Physical Exam Vitals reviewed.  Constitutional:      Appearance: He is obese.  HENT:     Head: Normocephalic.     Right Ear: Tympanic membrane, ear canal and external ear normal.     Left Ear: Tympanic membrane, ear canal and external ear normal.     Nose: Nose normal.  Eyes:     Extraocular Movements: Extraocular movements intact.     Pupils: Pupils are equal, round, and reactive to light.  Cardiovascular:     Rate and Rhythm: Normal rate and regular rhythm.   Pulmonary:     Effort: Pulmonary effort is normal.     Breath sounds: Normal breath sounds.  Abdominal:     General: Bowel sounds are normal. There is distension.     Palpations: Abdomen is soft.  Musculoskeletal:     Cervical back: Normal range of motion and neck supple.     Comments: Right hand weakness 2 to -3 weeks left hand fingers been feeling numb  Skin:    General: Skin is warm and dry.  Neurological:     Mental Status: He is oriented to person, place, and time.  Psychiatric:        Mood and Affect: Mood normal.        Behavior: Behavior normal.        Thought Content: Thought content normal.        Judgment: Judgment normal.    Assessment & Plan   Adam Callahan was seen today for diabetes.  Diagnoses and all orders for this visit:  Type 2 diabetes mellitus without complication, without long-term current use of insulin  (HCC) -     POCT glycosylated hemoglobin (Hb A1C)  Essential hypertension BP goal - < 140/90 Explained that having normal blood pressure is the goal and medications are helping to get to goal and maintain normal blood pressure. DIET: Limit salt intake, read nutrition labels to check salt content, limit fried and high fatty foods  Avoid using multisymptom OTC cold preparations that generally contain sudafed which can rise BP. Consult with pharmacist on best cold relief products to use for persons with HTN EXERCISE Discussed incorporating exercise such as walking - 30 minutes most days of the week and can do in 10 minute intervals    Dyslipidemia  Healthy lifestyle diet of fruits vegetables fish nuts whole grains and low saturated fat . Foods high in cholesterol or liver, fatty meats,cheese, butter avocados, nuts and seeds, chocolate and fried foods.  Patient have been counseled extensively about nutrition and exercise. Other issues discussed during this visit include: low cholesterol diet, weight control and daily exercise, foot care, annual eye examinations  at Ophthalmology, importance of adherence with medications and regular follow-up. We also discussed long term complications of uncontrolled diabetes and hypertension.    The patient was given clear instructions to go to ER or return to medical center if symptoms don't improve, worsen or new problems develop. The patient verbalized understanding. The patient was told to call to get lab results if they haven't heard anything in the next week.   This note has been created with  Dragon airline pilot. Any transcriptional errors are unintentional.   Adam SHAUNNA Bohr, Adam Callahan 06/18/2024, 10:46 AM

## 2024-06-14 NOTE — Telephone Encounter (Signed)
 Requested Prescriptions  Pending Prescriptions Disp Refills   triamterene -hydrochlorothiazide  (MAXZIDE-25) 37.5-25 MG tablet [Pharmacy Med Name: Triamterene -HCTZ 37.5-25 MG Oral Tablet] 100 tablet 1    Sig: TAKE 1 TABLET BY MOUTH DAILY     Cardiovascular: Diuretic Combos Failed - 06/14/2024  4:22 PM      Failed - Last BP in normal range    BP Readings from Last 1 Encounters:  06/14/24 (!) 147/80         Passed - K in normal range and within 180 days    Potassium  Date Value Ref Range Status  02/09/2024 4.5 3.5 - 5.2 mmol/L Final         Passed - Na in normal range and within 180 days    Sodium  Date Value Ref Range Status  02/09/2024 138 134 - 144 mmol/L Final         Passed - Cr in normal range and within 180 days    Creatinine, Ser  Date Value Ref Range Status  02/09/2024 1.16 0.76 - 1.27 mg/dL Final         Passed - Valid encounter within last 6 months    Recent Outpatient Visits           Today Type 2 diabetes mellitus without complication, without long-term current use of insulin  (HCC)   [place] [facility] Celestia Rosaline SQUIBB, NP   4 months ago Type 2 diabetes mellitus without complication, without long-term current use of insulin  (HCC)   [place] [facility] Celestia Rosaline SQUIBB, NP   7 months ago Type 2 diabetes mellitus without complication, without long-term current use of insulin  (HCC)   [place] [facility] Celestia Rosaline SQUIBB, NP   10 months ago Type 2 diabetes mellitus without complication, without long-term current use of insulin  (HCC)   [place] [facility] Celestia Rosaline SQUIBB, NP   1 year ago Type 2 diabetes mellitus without complication, without long-term current use of insulin  (HCC)   [place] [facility] Celestia Rosaline SQUIBB, NP               insulin  lispro (HUMALOG  KWIKPEN) 100 UNIT/ML KwikPen [Pharmacy Med Name: HumaLOG  KwikPen 100 UNIT/ML Subcutaneous  Solution Pen-injector] 15 mL 3    Sig: INJECT 14 UNITS INTO THE SKIN 3  TIMES DAILY     Endocrinology:  Diabetes - Insulins Passed - 06/14/2024  4:22 PM      Passed - HBA1C is between 0 and 7.9 and within 180 days    HbA1c, POC (controlled diabetic range)  Date Value Ref Range Status  06/14/2024 6.3 0.0 - 7.0 % Final         Passed - Valid encounter within last 6 months    Recent Outpatient Visits           Today Type 2 diabetes mellitus without complication, without long-term current use of insulin  (HCC)   [place] [facility] Celestia Rosaline SQUIBB, NP   4 months ago Type 2 diabetes mellitus without complication, without long-term current use of insulin  (HCC)   [place] [facility] Celestia Rosaline SQUIBB, NP   7 months ago Type 2 diabetes mellitus without complication, without long-term current use of insulin  (HCC)   [place] [facility] Celestia Rosaline SQUIBB, NP   10 months ago Type 2 diabetes mellitus without complication, without long-term current use of insulin  [facility])   [place] [facility] Celestia Rosaline  P, NP   1 year ago Type 2 diabetes mellitus without complication, without long-term current use of insulin  (HCC)   [place] [facility] Celestia Rosaline SQUIBB, NP               atorvastatin  (LIPITOR) 20 MG tablet [Pharmacy Med Name: Atorvastatin  Calcium  20 MG Oral Tablet] 100 tablet 1    Sig: TAKE 1 TABLET BY MOUTH DAILY     Cardiovascular:  Antilipid - Statins Failed - 06/14/2024  4:22 PM      Failed - Lipid Panel in normal range within the last 12 months    Cholesterol, Total  Date Value Ref Range Status  02/09/2024 127 100 - 199 mg/dL Final   LDL Chol Calc (NIH)  Date Value Ref Range Status  02/09/2024 62 0 - 99 mg/dL Final   HDL  Date Value Ref Range Status  02/09/2024 45 >39 mg/dL Final   Triglycerides  Date Value Ref Range Status  02/09/2024 112 0 - 149 mg/dL Final          Passed - Patient is not pregnant      Passed - Valid encounter within last 12 months    Recent Outpatient Visits           Today Type 2 diabetes mellitus without complication, without long-term current use of insulin  (HCC)   [place] [facility] Celestia Rosaline SQUIBB, NP   4 months ago Type 2 diabetes mellitus without complication, without long-term current use of insulin  (HCC)   [place] [facility] Celestia Rosaline SQUIBB, NP   7 months ago Type 2 diabetes mellitus without complication, without long-term current use of insulin  (HCC)   [place] [facility] Celestia Rosaline SQUIBB, NP   10 months ago Type 2 diabetes mellitus without complication, without long-term current use of insulin  (HCC)   [place] [facility] Celestia Rosaline SQUIBB, NP   1 year ago Type 2 diabetes mellitus without complication, without long-term current use of insulin  [facility])   [place] [facility] Celestia Rosaline SQUIBB, NP

## 2024-06-16 ENCOUNTER — Other Ambulatory Visit (HOSPITAL_COMMUNITY): Payer: Self-pay | Admitting: Emergency Medicine

## 2024-06-16 NOTE — Progress Notes (Signed)
 Paramedicine Encounter    [name], male    DOB: 05-06-1961, 63 y.o.   MRN: [id number]   Complaints Low blood sugars  Assessment Just prior to arrival today Adam Callahan was seen by EMS due to fall due to a low blood glucose.  He was treated w/ D10 IVP w/ resolution of weakness.  He denied need for transport and his son's wife was gone to pick him up some food.  Compliance with meds YES  Pill box filled x 3 weeks  Refills needed ASA 81mg   Meds changes since last visit NONE    Social changes NONE   BP 130/80 (BP Location: Left Arm, Patient Position: Sitting, Cuff Size: Normal)   Pulse (!) 16   ACTION: Home visit completed  Mary Claudene Kennel [phone] 06/16/24  Patient Care Team: Celestia Rosaline SQUIBB, NP as PCP - General (Internal Medicine)  Patient Active Problem List   Diagnosis Date Noted   Pain due to onychomycosis of toenails of both feet 08/06/2021   Diabetic neuropathy (HCC) 08/06/2021   Bilateral sensorineural hearing loss 03/04/2016   Cognitive deficit, post-stroke 11/14/2014   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) 07/08/2014   Homonymous hemianopsia following cerebrovascular accident 06/06/2014   Acute left hemiparesis (HCC) 05/04/2014   Stroke (HCC) 04/30/2014   Spastic hemiplegia affecting dominant side (HCC) 10/01/2013   Aphasia as late effect of cerebrovascular accident 10/01/2013   Alterations of sensations, late effect of cerebrovascular disease(438.6) 10/01/2013   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   Dehydration 06/07/2013   Hypokalemia 06/07/2013   Dyslipidemia 06/07/2013   Cerebral infarction (HCC) 06/06/2013   Diabetes mellitus type 2, uncontrolled 06/06/2013   Rhabdomyolysis 06/06/2013    Current Outpatient Medications:    amLODipine  (NORVASC ) 10 MG tablet, TAKE 1 TABLET BY MOUTH DAILY, Disp: 100 tablet, Rfl: 1   ASPIRIN  LOW DOSE 81 MG EC tablet, Take 81 mg by mouth  daily., Disp: , Rfl:    atorvastatin  (LIPITOR) 20 MG tablet, TAKE 1 TABLET BY MOUTH DAILY, Disp: 100 tablet, Rfl: 1   Continuous Glucose Sensor (FREESTYLE LIBRE 2 SENSOR) MISC, CHECK BLOOD SUGAR 3 TIMES DAILY, Disp: 8 each, Rfl: 2   insulin  glargine (LANTUS  SOLOSTAR) 100 UNIT/ML Solostar Pen, Inject 40 Units into the skin daily., Disp: 45 mL, Rfl: 2   insulin  lispro (HUMALOG  KWIKPEN) 100 UNIT/ML KwikPen, INJECT 14 UNITS INTO THE SKIN 3  TIMES DAILY, Disp: 15 mL, Rfl: 3   Insulin  Pen Needle (PEN NEEDLES) 32G X 5 MM MISC, Patient to use needles with pen as needed for injection of insulin , Disp: 100 each, Rfl: 2   triamterene -hydrochlorothiazide  (MAXZIDE-25) 37.5-25 MG tablet, TAKE 1 TABLET BY MOUTH DAILY, Disp: 100 tablet, Rfl: 1 No Known Allergies   Social History   Socioeconomic History   Marital status: Divorced    Spouse name: Not on file   Number of children: 2   Years of education: 12   Highest education level: Not on file  Occupational History   Not on file  Tobacco Use   Smoking status: Never   Smokeless tobacco: Never  Vaping Use   Vaping status: Never Used  Substance and Sexual Activity   Alcohol use: No    Alcohol/week: 0.0 standard drinks of alcohol   Drug use: No   Sexual activity: Not Currently  Other Topics Concern   Not on file  Social History Narrative   Patient is single and has 2 children.  Patient is right handed.   Patient has hs education.   Patient drinks diet caffeine free sodas daily.   Social Drivers of Health   Financial Resource Strain: Medium Risk (10/24/2023)   Overall Financial Resource Strain (CARDIA)    Difficulty of Paying Living Expenses: Somewhat hard  Food Insecurity: Food Insecurity Present (10/24/2023)   Hunger Vital Sign    Worried About Running Out of Food in the Last Year: Sometimes true    Ran Out of Food in the Last Year: Sometimes true  Transportation Needs: Unmet Transportation Needs (10/24/2023)   PRAPARE - Therapist, Art (Medical): Yes    Lack of Transportation (Non-Medical): No  Physical Activity: Inactive (09/17/2023)   Exercise Vital Sign    Days of Exercise per Week: 0 days    Minutes of Exercise per Session: 0 min  Stress: No Stress Concern Present (09/17/2023)   [employer] of Occupational Health - Occupational Stress Questionnaire    Feeling of Stress : Not at all  Social Connections: Moderately Isolated (09/17/2023)   Social Connection and Isolation Panel    Frequency of Communication with Friends and Family: More than three times a week    Frequency of Social Gatherings with Friends and Family: More than three times a week    Attends Religious Services: More than 4 times per year    Active Member of [employer] or Organizations: No    Attends Banker Meetings: Never    Marital Status: Divorced  Catering Manager Violence: Not At Risk (09/17/2023)   Humiliation, Afraid, Rape, and Kick questionnaire    Fear of Current or Ex-Partner: No    Emotionally Abused: No    Physically Abused: No    Sexually Abused: No    Physical Exam      Future Appointments  Date Time Provider Department Center  09/14/2024  3:30 PM Celestia Rosaline SQUIBB, NP [facility] [place]

## 2024-06-18 ENCOUNTER — Encounter (INDEPENDENT_AMBULATORY_CARE_PROVIDER_SITE_OTHER): Payer: Self-pay | Admitting: Primary Care

## 2024-06-24 ENCOUNTER — Emergency Department (HOSPITAL_COMMUNITY)

## 2024-06-24 ENCOUNTER — Emergency Department (HOSPITAL_COMMUNITY)
Admission: EM | Admit: 2024-06-24 | Discharge: 2024-06-24 | Disposition: A | Discharge status: Home / Self Care | Attending: Emergency Medicine | Admitting: Emergency Medicine

## 2024-06-24 ENCOUNTER — Encounter (HOSPITAL_COMMUNITY): Payer: Self-pay

## 2024-06-24 ENCOUNTER — Other Ambulatory Visit: Payer: Self-pay

## 2024-06-24 DIAGNOSIS — R4182 Altered mental status, unspecified: Secondary | ICD-10-CM | POA: Insufficient documentation

## 2024-06-24 DIAGNOSIS — G9389 Other specified disorders of brain: Secondary | ICD-10-CM | POA: Insufficient documentation

## 2024-06-24 DIAGNOSIS — S134XXA Sprain of ligaments of cervical spine, initial encounter: Secondary | ICD-10-CM | POA: Insufficient documentation

## 2024-06-24 DIAGNOSIS — E11649 Type 2 diabetes mellitus with hypoglycemia without coma: Secondary | ICD-10-CM | POA: Insufficient documentation

## 2024-06-24 DIAGNOSIS — E162 Hypoglycemia, unspecified: Secondary | ICD-10-CM | POA: Diagnosis not present

## 2024-06-24 DIAGNOSIS — E1140 Type 2 diabetes mellitus with diabetic neuropathy, unspecified: Secondary | ICD-10-CM | POA: Insufficient documentation

## 2024-06-24 DIAGNOSIS — Z794 Long term (current) use of insulin: Secondary | ICD-10-CM | POA: Insufficient documentation

## 2024-06-24 DIAGNOSIS — Z8673 Personal history of transient ischemic attack (TIA), and cerebral infarction without residual deficits: Secondary | ICD-10-CM | POA: Insufficient documentation

## 2024-06-24 DIAGNOSIS — Z79899 Other long term (current) drug therapy: Secondary | ICD-10-CM | POA: Insufficient documentation

## 2024-06-24 DIAGNOSIS — I10 Essential (primary) hypertension: Secondary | ICD-10-CM | POA: Insufficient documentation

## 2024-06-24 DIAGNOSIS — Y92410 Unspecified street and highway as the place of occurrence of the external cause: Secondary | ICD-10-CM | POA: Insufficient documentation

## 2024-06-24 DIAGNOSIS — Z7982 Long term (current) use of aspirin: Secondary | ICD-10-CM | POA: Insufficient documentation

## 2024-06-24 LAB — CBC WITH DIFFERENTIAL/PLATELET
Abs Immature Granulocytes: 0.02 K/uL (ref 0.00–0.07)
Basophils Absolute: 0.1 K/uL (ref 0.0–0.1)
Basophils Relative: 1 %
Eosinophils Absolute: 0.2 K/uL (ref 0.0–0.5)
Eosinophils Relative: 3 %
HCT: 36 % — ABNORMAL LOW (ref 39.0–52.0)
Hemoglobin: 12 g/dL — ABNORMAL LOW (ref 13.0–17.0)
Immature Granulocytes: 0 %
Lymphocytes Relative: 34 %
Lymphs Abs: 2 K/uL (ref 0.7–4.0)
MCH: 29.2 pg (ref 26.0–34.0)
MCHC: 33.3 g/dL (ref 30.0–36.0)
MCV: 87.6 fL (ref 80.0–100.0)
Monocytes Absolute: 0.5 K/uL (ref 0.1–1.0)
Monocytes Relative: 9 %
Neutro Abs: 3 K/uL (ref 1.7–7.7)
Neutrophils Relative %: 53 %
Platelets: 297 K/uL (ref 150–400)
RBC: 4.11 MIL/uL — ABNORMAL LOW (ref 4.22–5.81)
RDW: 13.2 % (ref 11.5–15.5)
WBC: 5.7 K/uL (ref 4.0–10.5)
nRBC: 0 % (ref 0.0–0.2)

## 2024-06-24 LAB — COMPREHENSIVE METABOLIC PANEL WITH GFR
ALT: 35 U/L (ref 0–44)
AST: 28 U/L (ref 15–41)
Albumin: 3.3 g/dL — ABNORMAL LOW (ref 3.5–5.0)
Alkaline Phosphatase: 60 U/L (ref 38–126)
Anion gap: 8 (ref 5–15)
BUN: 16 mg/dL (ref 8–23)
CO2: 26 mmol/L (ref 22–32)
Calcium: 8.5 mg/dL — ABNORMAL LOW (ref 8.9–10.3)
Chloride: 101 mmol/L (ref 98–111)
Creatinine, Ser: 1.07 mg/dL (ref 0.61–1.24)
GFR, Estimated: >60 mL/min (ref >60)
Glucose, Bld: 208 mg/dL — ABNORMAL HIGH (ref 70–99)
Potassium: 3.4 mmol/L — ABNORMAL LOW (ref 3.5–5.1)
Sodium: 135 mmol/L (ref 135–145)
Total Bilirubin: 0.4 mg/dL (ref 0.0–1.2)
Total Protein: 7.1 g/dL (ref 6.5–8.1)

## 2024-06-24 LAB — CBG MONITORING, ED
Glucose-Capillary: 155 mg/dL — ABNORMAL HIGH (ref 70–99)
Glucose-Capillary: 40 mg/dL — CL (ref 70–99)
Glucose-Capillary: 49 mg/dL — ABNORMAL LOW (ref 70–99)
Glucose-Capillary: 183 mg/dL — ABNORMAL HIGH (ref 70–99)

## 2024-06-24 MED ORDER — ACETAMINOPHEN 500 MG PO TABS
1000.0000 mg | ORAL_TABLET | Freq: Once | ORAL | Status: AC
  Administered 2024-06-24: 1000 mg via ORAL
  Filled 2024-06-24: qty 2

## 2024-06-24 MED ORDER — DEXTROSE 50 % IV SOLN
1.0000 | Freq: Once | INTRAVENOUS | Status: AC
  Administered 2024-06-24: 50 mL via INTRAVENOUS
  Filled 2024-06-24: qty 50

## 2024-06-24 MED ORDER — DEXTROSE IN LACTATED RINGERS 5 % IV SOLN: INTRAVENOUS | Status: DC

## 2024-06-24 NOTE — ED Notes (Signed)
 Rn placed c-collar per PAs request

## 2024-06-24 NOTE — ED Notes (Signed)
 PT alert and oriented, provided with food and beverage per Dr Francesca r/t low Cbg

## 2024-06-24 NOTE — Discharge Instructions (Signed)
 You were seen in the emergency department and offered admission for your low blood sugars.  You declined admission and wished to be discharged.  Please decrease your lispro to 10 units 3 times a day and continue the same dose of Lantus .  Follow your blood sugars and eat regular meals.  Follow-up with your primary care doctor.  Return if any worsening or concerning symptoms

## 2024-06-24 NOTE — ED Notes (Signed)
PT transported to Xray
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
## 2024-07-01 ENCOUNTER — Other Ambulatory Visit (HOSPITAL_COMMUNITY): Payer: Self-pay | Admitting: Emergency Medicine
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
## 2024-07-08 ENCOUNTER — Ambulatory Visit: Payer: Self-pay
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
## 2024-07-12 ENCOUNTER — Ambulatory Visit (INDEPENDENT_AMBULATORY_CARE_PROVIDER_SITE_OTHER): Admitting: Primary Care

## 2024-07-12 ENCOUNTER — Encounter (INDEPENDENT_AMBULATORY_CARE_PROVIDER_SITE_OTHER): Payer: Self-pay | Admitting: Primary Care

## 2024-07-12 DIAGNOSIS — Z794 Long term (current) use of insulin: Secondary | ICD-10-CM | POA: Diagnosis not present

## 2024-07-12 DIAGNOSIS — I10 Essential (primary) hypertension: Secondary | ICD-10-CM

## 2024-07-12 DIAGNOSIS — Z09 Encounter for follow-up examination after completed treatment for conditions other than malignant neoplasm: Secondary | ICD-10-CM

## 2024-07-12 DIAGNOSIS — Z760 Encounter for issue of repeat prescription: Secondary | ICD-10-CM

## 2024-07-12 DIAGNOSIS — E119 Type 2 diabetes mellitus without complications: Secondary | ICD-10-CM

## 2024-07-12 DIAGNOSIS — E876 Hypokalemia: Secondary | ICD-10-CM | POA: Diagnosis not present

## 2024-07-12 MED ORDER — LANTUS SOLOSTAR 100 UNIT/ML ~~LOC~~ SOPN: 30.0000 [IU] | PEN_INJECTOR | Freq: Every day | SUBCUTANEOUS | 2 refills | Status: AC

## 2024-07-12 MED ORDER — INSULIN LISPRO (1 UNIT DIAL) 100 UNIT/ML (KWIKPEN)
10.0000 [IU] | PEN_INJECTOR | Freq: Three times a day (TID) | SUBCUTANEOUS | 1 refills | Status: AC
  Filled 2024-07-19: qty 3, 10d supply, fill #0
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
## 2024-07-12 NOTE — Patient Instructions (Signed)
Potassium Content of Foods  The body needs potassium to control blood pressure and to keep the muscles and nervous system healthy. Here are some healthy foods below that are high in potassium. Also you can get the white label salt of "NO SALT" salt substitute, 1/4 teaspoon of this is equivalent to 20meq potassium.   FOODS AND DRINKS HIGH IN POTASSIUM FOODS MODERATE IN POTASSIUM   Fruits  Avocado (cubed),  c / 50 g.  Banana (sliced), 75 g.  Cantaloupe (cubed), 80 g.  Honeydew, 1 wedge / 85 g.  Kiwi (sliced), 90 g.  Nectarine, 1 small / 129 g.  Orange, 1 medium / 131 g. Vegetables  Artichoke,  of a medium / 64 g.  Asparagus (boiled), 90 g..  Broccoli (boiled), 78 g.  Brussels sprout (boiled), 78 g.  Butternut squash (baked), 103 g.  Chickpea (cooked), 82 g.  Green peas (cooked), 80 g.  Kidney beans (cooked), 5 tbsp / 55 g.  Lima beans (cooked),  c / 43 g.  Navy beans (cooked),  c / 61 g.  Spinach (cooked),  c / 45 g.  Sweet potato (baked),  c / 50 g.  Tomato (chopped or sliced), 90 g.  Vegetable juice.  White mushrooms (cooked), 78 g.  Yam (cooked or baked),  c / 34 g.  Zucchini squash (boiled), 90 g. Other Foods and Drinks  Almonds (whole),  c / 36 g.  Fish, 3 oz / 85 g.  Nonfat fruit variety yogurt, 123 g.  Pistachio nuts, 1 oz / 28 g.  Pumpkin seeds, 1 oz / 28 g.  Red meat (broiled, cooked, grilled), 3 oz / 85 g.  Scallops (steamed), 3 oz / 85 g.  Spaghetti sauce,  c / 66 g.  Sunflower seeds (dry roasted), 1 oz / 28 g.  Veggie burger, 1 patty / 70 g. Fruits  Grapefruit,  of the fruit / 123 g  Plums (sliced), 83 g.  Tangerine, 1 large / 120 g. Vegetables  Carrots (boiled), 78 g.  Carrots (sliced), 61 g.  Rhubarb (cooked with sugar), 120 g.  Rutabaga (cooked), 120 g.  Yellow snap beans (cooked), 63 g. Other Foods and Drinks   Chicken breast (roasted and chopped),  c / 70 g.  Pita bread, 1 large / 64 g.  Shrimp  (steamed), 4 oz / 113 g.  Swiss cheese (diced), 70 g.      

## 2024-07-14 ENCOUNTER — Other Ambulatory Visit (HOSPITAL_COMMUNITY): Payer: Self-pay | Admitting: Emergency Medicine

## 2024-07-14 NOTE — Progress Notes (Unsigned)
 Paramedicine Encounter    [name], male    DOB: April 12, 1961, 63 y.o.   MRN: [id number]   Complaints***  Assessment***  Compliance with meds***  Pill box filled***  Refills needed***  Meds changes since last visit***    Social changes***   There were no vitals taken for this visit. 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) 07/08/2014   Homonymous hemianopsia following cerebrovascular accident 06/06/2014   Acute left hemiparesis (HCC) 05/04/2014   Stroke (HCC) 04/30/2014   Spastic hemiplegia affecting dominant side (HCC) 10/01/2013   Aphasia as late effect of cerebrovascular accident 10/01/2013   Alterations of sensations, late effect of cerebrovascular disease(438.6) 10/01/2013   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   Dehydration 06/07/2013   Hypokalemia 06/07/2013   Dyslipidemia 06/07/2013   Cerebral infarction (HCC) 06/06/2013   Diabetes mellitus type 2, uncontrolled 06/06/2013   Rhabdomyolysis 06/06/2013   Current Medications[1] Allergies[2]   Social History   Socioeconomic History   Marital status: Divorced    Spouse name: Not on file   Number of children: 2   Years of education: 12   Highest education level: Not on file  Occupational History   Not on file  Tobacco Use   Smoking status: Never   Smokeless tobacco: Never  Vaping Use   Vaping status: Never Used  Substance and Sexual Activity   Alcohol  use: No    Alcohol/week: 0.0 standard drinks of alcohol   Drug use: No   Sexual activity: Not Currently  Other Topics Concern   Not on file  Social History Narrative   Patient is single and has 2 children.   Patient is right handed.   Patient has hs education.   Patient drinks diet caffeine free sodas daily.   Social Drivers of Health   Tobacco Use: Low Risk (07/12/2024)   Patient History    Smoking Tobacco Use: Never    Smokeless Tobacco Use: Never    Passive Exposure: Not on file  Financial Resource Strain: Medium Risk (10/24/2023)   Overall Financial Resource Strain (CARDIA)    Difficulty of Paying Living Expenses: Somewhat hard  Food Insecurity: Food Insecurity Present (10/24/2023)   Hunger Vital Sign    Worried About Running Out of Food in the Last Year: Sometimes true    Ran Out of Food in the Last Year: Sometimes true  Transportation Needs: Unmet Transportation Needs (10/24/2023)   PRAPARE - Administrator, Civil Service (Medical): Yes    Lack of Transportation (Non-Medical): No  Physical Activity: Inactive (09/17/2023)   Exercise Vital Sign    Days of Exercise per Week: 0 days    Minutes of Exercise per Session: 0 min  Stress: No Stress Concern Present (09/17/2023)   [employer] of Occupational Health - Occupational Stress Questionnaire    Feeling of Stress : Not at all  Social Connections: Moderately Isolated (09/17/2023)   Social Connection and Isolation Panel    Frequency of Communication with Friends and Family: More than three times a week  Frequency of Social Gatherings with Friends and Family: More than three times a week    Attends Religious Services: More than 4 times per year    Active Member of Clubs or Organizations: No    Attends Banker Meetings: Never    Marital Status: Divorced  Catering Manager Violence: Not At Risk (09/17/2023)   Humiliation, Afraid, Rape, and Kick questionnaire    Fear of Current or  Ex-Partner: No    Emotionally Abused: No    Physically Abused: No    Sexually Abused: No  Depression (PHQ2-9): Low Risk (07/12/2024)   Depression (PHQ2-9)    PHQ-2 Score: 0  Alcohol Screen: Low Risk (09/17/2023)   Alcohol Screen    Last Alcohol Screening Score (AUDIT): 0  Housing: Low Risk (10/24/2023)   Housing Stability Vital Sign    Unable to Pay for Housing in the Last Year: No    Number of Times Moved in the Last Year: 0    Homeless in the Last Year: No  Utilities: Not At Risk (10/24/2023)   AHC Utilities    Threatened with loss of utilities: No  Health Literacy: Adequate Health Literacy (09/17/2023)   B1300 Health Literacy    Frequency of need for help with medical instructions: Never    Physical Exam      Future Appointments  Date Time Provider Department Center  09/14/2024  3:30 PM Celestia Rosaline SQUIBB, NP [facility] [place] [facility]            [1]  Current Outpatient Medications:    amLODipine  (NORVASC ) 10 MG tablet, TAKE 1 TABLET BY MOUTH DAILY, Disp: 100 tablet, Rfl: 1   ASPIRIN  LOW DOSE 81 MG EC tablet, Take 81 mg by mouth daily., Disp: , Rfl:    atorvastatin  (LIPITOR) 20 MG tablet, TAKE 1 TABLET BY MOUTH DAILY, Disp: 100 tablet, Rfl: 1   insulin  glargine (LANTUS  SOLOSTAR) 100 UNIT/ML Solostar Pen, Inject 30 Units into the skin daily., Disp: 45 mL, Rfl: 2   insulin  lispro (HUMALOG  KWIKPEN) 100 UNIT/ML KwikPen, Inject 10 Units into the skin 3 (three) times daily., Disp: 3 mL, Rfl: 1   triamterene -hydrochlorothiazide  (MAXZIDE-25) 37.5-25 MG tablet, TAKE 1 TABLET BY MOUTH DAILY, Disp: 100 tablet, Rfl: 1   Continuous Glucose Sensor (FREESTYLE LIBRE 2 SENSOR) MISC, CHECK BLOOD SUGAR 3 TIMES DAILY, Disp: 8 each, Rfl: 2   Insulin  Pen Needle (PEN NEEDLES) 32G X 5 MM MISC, Patient to use needles with pen as needed for injection of insulin , Disp: 100 each, Rfl: 2 [2] No Known Allergies

## 2024-07-19 ENCOUNTER — Other Ambulatory Visit: Payer: Self-pay

## 2024-07-19 MED ORDER — INSULIN GLARGINE 100 UNIT/ML SOLOSTAR PEN
40.0000 [IU] | PEN_INJECTOR | Freq: Every day | SUBCUTANEOUS | 2 refills | Status: AC
  Filled 2024-07-19: qty 39, 97d supply, fill #0

## 2024-07-29 ENCOUNTER — Telehealth (HOSPITAL_COMMUNITY): Payer: Self-pay

## 2024-07-29 NOTE — Telephone Encounter (Signed)
"   HF Paramedicine Team Based Care Meeting    [place] HF Paramedicine  Adam Adam Adam Adam admit within the last 30 days- No   Medications concerns- No - pill box- biweekly   Transportation issues- doesn't drive, uses medicaid transportation   Education needs- Not at this time   SDOH- wants to apply for food stamps pending application   Eligible for discharge- movement to new pharmacy for bubble packs in the near future- pcp apt in feb for reassessment.   Adam Adam, EMT-Paramedic [phone] 07/29/2024  "

## 2024-07-30 ENCOUNTER — Other Ambulatory Visit (HOSPITAL_COMMUNITY): Payer: Self-pay | Admitting: Emergency Medicine

## 2024-07-30 NOTE — Progress Notes (Unsigned)
 Paramedicine Encounter    [name], male    DOB: 11-Jun-1961, 64 y.o.   MRN: [id number]   Complaints***  Assessment***  Compliance with meds***  Pill box filled***  Refills needed***  Meds changes since last visit***    Social changes***   There were no vitals taken for this visit. 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) 07/08/2014   Homonymous hemianopsia following cerebrovascular accident 06/06/2014   Acute left hemiparesis (HCC) 05/04/2014   Stroke (HCC) 04/30/2014   Spastic hemiplegia affecting dominant side (HCC) 10/01/2013   Aphasia as late effect of cerebrovascular accident 10/01/2013   Alterations of sensations, late effect of cerebrovascular disease(438.6) 10/01/2013   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   Dehydration 06/07/2013   Hypokalemia 06/07/2013   Dyslipidemia 06/07/2013   Cerebral infarction (HCC) 06/06/2013   Diabetes mellitus type 2, uncontrolled 06/06/2013   Rhabdomyolysis 06/06/2013   Current Medications[1] Allergies[2]   Social History   Socioeconomic History   Marital status: Divorced    Spouse name: Not on file   Number of children: 2   Years of education: 12   Highest education level: Not on file  Occupational History   Not on file  Tobacco Use   Smoking status: Never   Smokeless tobacco: Never  Vaping Use   Vaping status: Never Used  Substance and Sexual Activity   Alcohol  use: No    Alcohol/week: 0.0 standard drinks of alcohol   Drug use: No   Sexual activity: Not Currently  Other Topics Concern   Not on file  Social History Narrative   Patient is single and has 2 children.   Patient is right handed.   Patient has hs education.   Patient drinks diet caffeine free sodas daily.   Social Drivers of Health   Tobacco Use: Low Risk (07/12/2024)   Patient History    Smoking Tobacco Use: Never    Smokeless Tobacco Use: Never    Passive Exposure: Not on file  Financial Resource Strain: Medium Risk (10/24/2023)   Overall Financial Resource Strain (CARDIA)    Difficulty of Paying Living Expenses: Somewhat hard  Food Insecurity: Food Insecurity Present (10/24/2023)   Hunger Vital Sign    Worried About Running Out of Food in the Last Year: Sometimes true    Ran Out of Food in the Last Year: Sometimes true  Transportation Needs: Unmet Transportation Needs (10/24/2023)   PRAPARE - Administrator, Civil Service (Medical): Yes    Lack of Transportation (Non-Medical): No  Physical Activity: Inactive (09/17/2023)   Exercise Vital Sign    Days of Exercise per Week: 0 days    Minutes of Exercise per Session: 0 min  Stress: No Stress Concern Present (09/17/2023)   [employer] of Occupational Health - Occupational Stress Questionnaire    Feeling of Stress : Not at all  Social Connections: Moderately Isolated (09/17/2023)   Social Connection and Isolation Panel    Frequency of Communication with Friends and Family: More than three times a week  Frequency of Social Gatherings with Friends and Family: More than three times a week    Attends Religious Services: More than 4 times per year    Active Member of Clubs or Organizations: No    Attends Banker Meetings: Never    Marital Status: Divorced  Catering Manager Violence: Not At Risk (09/17/2023)   Humiliation, Afraid, Rape, and Kick questionnaire    Fear of Current or  Ex-Partner: No    Emotionally Abused: No    Physically Abused: No    Sexually Abused: No  Depression (PHQ2-9): Low Risk (07/12/2024)   Depression (PHQ2-9)    PHQ-2 Score: 0  Alcohol Screen: Low Risk (09/17/2023)   Alcohol Screen    Last Alcohol Screening Score (AUDIT): 0  Housing: Low Risk (10/24/2023)   Housing Stability Vital Sign    Unable to Pay for Housing in the Last Year: No    Number of Times Moved in the Last Year: 0    Homeless in the Last Year: No  Utilities: Not At Risk (10/24/2023)   AHC Utilities    Threatened with loss of utilities: No  Health Literacy: Adequate Health Literacy (09/17/2023)   B1300 Health Literacy    Frequency of need for help with medical instructions: Never    Physical Exam      Future Appointments  Date Time Provider Department Center  09/14/2024  3:30 PM Celestia Rosaline SQUIBB, NP [facility] [place]            [1]  Current Outpatient Medications:    amLODipine  (NORVASC ) 10 MG tablet, TAKE 1 TABLET BY MOUTH DAILY, Disp: 100 tablet, Rfl: 1   ASPIRIN  LOW DOSE 81 MG EC tablet, Take 81 mg by mouth daily., Disp: , Rfl:    atorvastatin  (LIPITOR) 20 MG tablet, TAKE 1 TABLET BY MOUTH DAILY, Disp: 100 tablet, Rfl: 1   insulin  glargine (LANTUS  SOLOSTAR) 100 UNIT/ML Solostar Pen, Inject 30 Units into the skin daily., Disp: 45 mL, Rfl: 2   insulin  glargine (LANTUS ) 100 UNIT/ML Solostar Pen, Inject 40 Units into the skin daily., Disp: 45 mL, Rfl: 2   insulin  lispro (HUMALOG  KWIKPEN) 100 UNIT/ML KwikPen, Inject 10 Units into the skin 3 (three) times daily., Disp: 3 mL, Rfl: 1   triamterene -hydrochlorothiazide  (MAXZIDE-25) 37.5-25 MG tablet, TAKE 1 TABLET BY MOUTH DAILY, Disp: 100 tablet, Rfl: 1   Continuous Glucose Sensor (FREESTYLE LIBRE 2 SENSOR) MISC, CHECK BLOOD SUGAR 3 TIMES DAILY, Disp: 8 each, Rfl: 2   Insulin  Pen Needle (PEN NEEDLES) 32G X 5 MM MISC, Patient to use needles with pen as needed for injection of insulin , Disp:  100 each, Rfl: 2 [2] No Known Allergies

## 2024-08-25 ENCOUNTER — Telehealth (HOSPITAL_COMMUNITY): Payer: Self-pay

## 2024-08-25 NOTE — Telephone Encounter (Signed)
 HF Paramedicine Team Based Care Meeting  [facility] Based Team Meeting-   [place] HF Paramedicine  Katie Lynch Powell Mirza Dede Smith  [facility] admit within the last 30 days- None   Medications concerns- None (pending bubble packs)   Transportation issues- son or [facility] transportation   Education needs- freestyle (Dede education suggestions with Herlene at [facility])   SDOH- needs to apply for foodstamps but has no support to get this done- Slater will refer to legal aide for same if he is agreeable.   Eligible for discharge- not at this time- pending bubble packs.

## 2024-09-14 ENCOUNTER — Ambulatory Visit (INDEPENDENT_AMBULATORY_CARE_PROVIDER_SITE_OTHER): Admitting: Primary Care
